# Patient Record
Sex: Female | Born: 1990 | Race: Black or African American | Hispanic: No | Marital: Single | Smoking: Never smoker
Health system: Southern US, Community
[De-identification: ages and names within clinical notes are randomized; demographics above are authoritative.]

## PROBLEM LIST (undated history)

## (undated) ENCOUNTER — Inpatient Hospital Stay (HOSPITAL_COMMUNITY): Payer: Self-pay

## (undated) DIAGNOSIS — A048 Other specified bacterial intestinal infections: Secondary | ICD-10-CM

## (undated) DIAGNOSIS — N200 Calculus of kidney: Secondary | ICD-10-CM

## (undated) DIAGNOSIS — R51 Headache: Secondary | ICD-10-CM

## (undated) DIAGNOSIS — B009 Herpesviral infection, unspecified: Secondary | ICD-10-CM

## (undated) DIAGNOSIS — D649 Anemia, unspecified: Secondary | ICD-10-CM

## (undated) DIAGNOSIS — N83209 Unspecified ovarian cyst, unspecified side: Secondary | ICD-10-CM

## (undated) DIAGNOSIS — O139 Gestational [pregnancy-induced] hypertension without significant proteinuria, unspecified trimester: Secondary | ICD-10-CM

## (undated) DIAGNOSIS — N390 Urinary tract infection, site not specified: Secondary | ICD-10-CM

## (undated) DIAGNOSIS — K8020 Calculus of gallbladder without cholecystitis without obstruction: Secondary | ICD-10-CM

## (undated) HISTORY — PX: APPENDECTOMY: SHX54

## (undated) HISTORY — DX: Urinary tract infection, site not specified: N39.0

---
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
## 2010-03-05 ENCOUNTER — Emergency Department (HOSPITAL_COMMUNITY)
Admission: EM | Admit: 2010-03-05 | Discharge: 2010-03-05 | Payer: Self-pay | Source: Home / Self Care | Admitting: Emergency Medicine

## 2010-04-15 ENCOUNTER — Emergency Department (HOSPITAL_COMMUNITY)
Admission: EM | Admit: 2010-04-15 | Discharge: 2010-04-15 | Payer: Self-pay | Source: Home / Self Care | Admitting: Emergency Medicine

## 2010-04-15 LAB — POCT URINALYSIS DIPSTICK
Bilirubin Urine: NEGATIVE
Ketones, ur: NEGATIVE mg/dL
Nitrite: NEGATIVE
Protein, ur: NEGATIVE mg/dL
Specific Gravity, Urine: 1.025 (ref 1.005–1.030)
Urine Glucose, Fasting: NEGATIVE mg/dL
Urobilinogen, UA: 0.2 mg/dL (ref 0.0–1.0)
pH: 6.5 (ref 5.0–8.0)

## 2010-05-28 LAB — RAPID URINE DRUG SCREEN, HOSP PERFORMED
Amphetamines: NOT DETECTED
Barbiturates: NOT DETECTED
Benzodiazepines: NOT DETECTED
Cocaine: NOT DETECTED
Opiates: NOT DETECTED
Tetrahydrocannabinol: NOT DETECTED

## 2010-05-28 LAB — URINALYSIS, ROUTINE W REFLEX MICROSCOPIC
Bilirubin Urine: NEGATIVE
Glucose, UA: NEGATIVE mg/dL
Nitrite: NEGATIVE
Protein, ur: 100 mg/dL — AB
Specific Gravity, Urine: 1.018 (ref 1.005–1.030)
Urobilinogen, UA: 0.2 mg/dL (ref 0.0–1.0)
pH: 5 (ref 5.0–8.0)

## 2010-05-28 LAB — URINE MICROSCOPIC-ADD ON

## 2010-05-28 LAB — DIFFERENTIAL
Basophils Absolute: 0 10*3/uL (ref 0.0–0.1)
Basophils Relative: 0 % (ref 0–1)
Eosinophils Absolute: 0.3 10*3/uL (ref 0.0–0.7)
Eosinophils Relative: 3 % (ref 0–5)
Lymphocytes Relative: 23 % (ref 12–46)
Lymphs Abs: 2.1 10*3/uL (ref 0.7–4.0)
Monocytes Absolute: 0.5 10*3/uL (ref 0.1–1.0)
Monocytes Relative: 6 % (ref 3–12)
Neutro Abs: 6.2 10*3/uL (ref 1.7–7.7)
Neutrophils Relative %: 68 % (ref 43–77)

## 2010-05-28 LAB — HEPATIC FUNCTION PANEL
ALT: 13 U/L (ref 0–35)
AST: 20 U/L (ref 0–37)
Albumin: 4.2 g/dL (ref 3.5–5.2)
Alkaline Phosphatase: 78 U/L (ref 39–117)
Bilirubin, Direct: 0.1 mg/dL (ref 0.0–0.3)
Indirect Bilirubin: 0.3 mg/dL (ref 0.3–0.9)
Total Bilirubin: 0.4 mg/dL (ref 0.3–1.2)
Total Protein: 7.8 g/dL (ref 6.0–8.3)

## 2010-05-28 LAB — CBC
HCT: 36.9 % (ref 36.0–46.0)
Hemoglobin: 12.2 g/dL (ref 12.0–15.0)
MCH: 28.3 pg (ref 26.0–34.0)
MCHC: 33.1 g/dL (ref 30.0–36.0)
MCV: 85.6 fL (ref 78.0–100.0)
Platelets: 342 10*3/uL (ref 150–400)
RBC: 4.31 MIL/uL (ref 3.87–5.11)
RDW: 13.7 % (ref 11.5–15.5)
WBC: 9.1 10*3/uL (ref 4.0–10.5)

## 2010-05-28 LAB — BASIC METABOLIC PANEL
BUN: 5 mg/dL — ABNORMAL LOW (ref 6–23)
CO2: 26 mEq/L (ref 19–32)
Calcium: 9.9 mg/dL (ref 8.4–10.5)
Chloride: 106 mEq/L (ref 96–112)
Creatinine, Ser: 0.72 mg/dL (ref 0.4–1.2)
GFR calc Af Amer: >60 mL/min (ref >60)
GFR calc non Af Amer: >60 mL/min (ref >60)
Glucose, Bld: 75 mg/dL (ref 70–99)
Potassium: 3.4 mEq/L — ABNORMAL LOW (ref 3.5–5.1)
Sodium: 141 mEq/L (ref 135–145)

## 2010-05-28 LAB — ACETAMINOPHEN LEVEL: Acetaminophen (Tylenol), Serum: <10 ug/mL — ABNORMAL LOW (ref 10–30)

## 2010-05-28 LAB — ETHANOL: Alcohol, Ethyl (B): <5 mg/dL (ref 0–10)

## 2010-05-28 LAB — PREGNANCY, URINE: Preg Test, Ur: NEGATIVE

## 2010-05-28 LAB — LIPASE, BLOOD: Lipase: 25 U/L (ref 11–59)

## 2010-05-28 LAB — SALICYLATE LEVEL: Salicylate Lvl: 16.2 mg/dL (ref 2.8–20.0)

## 2010-09-18 ENCOUNTER — Inpatient Hospital Stay (HOSPITAL_COMMUNITY)
Admission: EM | Admit: 2010-09-18 | Discharge: 2010-09-18 | Disposition: A | Discharge status: Home / Self Care | Payer: Self-pay | Source: Ambulatory Visit | Attending: Obstetrics and Gynecology | Admitting: Obstetrics and Gynecology

## 2010-09-18 ENCOUNTER — Inpatient Hospital Stay (HOSPITAL_COMMUNITY): Payer: Self-pay

## 2010-09-18 DIAGNOSIS — A5901 Trichomonal vulvovaginitis: Secondary | ICD-10-CM | POA: Insufficient documentation

## 2010-09-18 DIAGNOSIS — O210 Mild hyperemesis gravidarum: Secondary | ICD-10-CM

## 2010-09-18 DIAGNOSIS — O98819 Other maternal infectious and parasitic diseases complicating pregnancy, unspecified trimester: Secondary | ICD-10-CM | POA: Insufficient documentation

## 2010-09-18 LAB — CBC
HCT: 34.8 % — ABNORMAL LOW (ref 36.0–46.0)
Hemoglobin: 11.5 g/dL — ABNORMAL LOW (ref 12.0–15.0)
MCH: 27.2 pg (ref 26.0–34.0)
MCHC: 33 g/dL (ref 30.0–36.0)
MCV: 82.3 fL (ref 78.0–100.0)
Platelets: 327 10*3/uL (ref 150–400)
RBC: 4.23 MIL/uL (ref 3.87–5.11)
RDW: 14.5 % (ref 11.5–15.5)
WBC: 11.1 10*3/uL — ABNORMAL HIGH (ref 4.0–10.5)

## 2010-09-18 LAB — ABO/RH: ABO/RH(D): A POS

## 2010-09-18 LAB — WET PREP, GENITAL: Yeast Wet Prep HPF POC: NONE SEEN

## 2010-09-18 LAB — HCG, QUANTITATIVE, PREGNANCY: hCG, Beta Chain, Quant, S: 7586 m[IU]/mL — ABNORMAL HIGH (ref <5)

## 2010-09-20 LAB — URINE CULTURE
Colony Count: 35000
Culture  Setup Time: 201207040126

## 2010-09-20 LAB — GC/CHLAMYDIA PROBE AMP, GENITAL
Chlamydia, DNA Probe: POSITIVE — AB
GC Probe Amp, Genital: NEGATIVE

## 2010-09-22 LAB — URINE MICROSCOPIC-ADD ON

## 2010-09-22 LAB — URINALYSIS, ROUTINE W REFLEX MICROSCOPIC
Bilirubin Urine: NEGATIVE
Glucose, UA: NEGATIVE mg/dL
Ketones, ur: NEGATIVE mg/dL
Nitrite: NEGATIVE
Protein, ur: NEGATIVE mg/dL
Specific Gravity, Urine: >1.03 — ABNORMAL HIGH (ref 1.005–1.030)
Urobilinogen, UA: 0.2 mg/dL (ref 0.0–1.0)
pH: 6 (ref 5.0–8.0)

## 2010-09-22 LAB — POCT PREGNANCY, URINE: Preg Test, Ur: POSITIVE

## 2010-11-26 ENCOUNTER — Emergency Department (HOSPITAL_COMMUNITY)
Admission: EM | Admit: 2010-11-26 | Discharge: 2010-11-27 | Disposition: A | Discharge status: Home / Self Care | Payer: Self-pay | Attending: Emergency Medicine | Admitting: Emergency Medicine

## 2010-11-26 DIAGNOSIS — R509 Fever, unspecified: Secondary | ICD-10-CM | POA: Insufficient documentation

## 2010-11-26 DIAGNOSIS — O219 Vomiting of pregnancy, unspecified: Secondary | ICD-10-CM | POA: Insufficient documentation

## 2010-11-26 DIAGNOSIS — R109 Unspecified abdominal pain: Secondary | ICD-10-CM | POA: Insufficient documentation

## 2010-11-27 LAB — URINALYSIS, ROUTINE W REFLEX MICROSCOPIC
Bilirubin Urine: NEGATIVE
Glucose, UA: NEGATIVE mg/dL
Hgb urine dipstick: NEGATIVE
Ketones, ur: >80 mg/dL — AB
Leukocytes, UA: NEGATIVE
Nitrite: NEGATIVE
Protein, ur: NEGATIVE mg/dL
Specific Gravity, Urine: 1.023 (ref 1.005–1.030)
Urobilinogen, UA: 1 mg/dL (ref 0.0–1.0)
pH: 6.5 (ref 5.0–8.0)

## 2010-11-27 LAB — CBC
HCT: 31.7 % — ABNORMAL LOW (ref 36.0–46.0)
Hemoglobin: 10.7 g/dL — ABNORMAL LOW (ref 12.0–15.0)
MCH: 27.8 pg (ref 26.0–34.0)
MCHC: 33.8 g/dL (ref 30.0–36.0)
MCV: 82.3 fL (ref 78.0–100.0)
Platelets: 300 10*3/uL (ref 150–400)
RBC: 3.85 MIL/uL — ABNORMAL LOW (ref 3.87–5.11)
RDW: 14.7 % (ref 11.5–15.5)
WBC: 8.9 10*3/uL (ref 4.0–10.5)

## 2010-11-27 LAB — DIFFERENTIAL
Basophils Absolute: 0 10*3/uL (ref 0.0–0.1)
Basophils Relative: 0 % (ref 0–1)
Eosinophils Absolute: 0.1 10*3/uL (ref 0.0–0.7)
Eosinophils Relative: 1 % (ref 0–5)
Lymphocytes Relative: 14 % (ref 12–46)
Lymphs Abs: 1.2 10*3/uL (ref 0.7–4.0)
Monocytes Absolute: 0.9 10*3/uL (ref 0.1–1.0)
Monocytes Relative: 10 % (ref 3–12)
Neutro Abs: 6.8 10*3/uL (ref 1.7–7.7)
Neutrophils Relative %: 76 % (ref 43–77)

## 2010-11-27 LAB — POCT I-STAT, CHEM 8
BUN: <3 mg/dL — ABNORMAL LOW (ref 6–23)
Calcium, Ion: 1.11 mmol/L — ABNORMAL LOW (ref 1.12–1.32)
Chloride: 106 mEq/L (ref 96–112)
Creatinine, Ser: 0.4 mg/dL — ABNORMAL LOW (ref 0.50–1.10)
Glucose, Bld: 83 mg/dL (ref 70–99)
HCT: 31 % — ABNORMAL LOW (ref 36.0–46.0)
Hemoglobin: 10.5 g/dL — ABNORMAL LOW (ref 12.0–15.0)
Potassium: 3.7 mEq/L (ref 3.5–5.1)
Sodium: 135 mEq/L (ref 135–145)
TCO2: 18 mmol/L (ref 0–100)

## 2011-01-25 ENCOUNTER — Inpatient Hospital Stay (HOSPITAL_COMMUNITY)
Admission: AD | Admit: 2011-01-25 | Discharge: 2011-01-25 | Disposition: A | Discharge status: Home / Self Care | Payer: Medicaid Other | Source: Ambulatory Visit | Attending: Obstetrics & Gynecology | Admitting: Obstetrics & Gynecology

## 2011-01-25 ENCOUNTER — Encounter (HOSPITAL_COMMUNITY): Payer: Self-pay

## 2011-01-25 DIAGNOSIS — O210 Mild hyperemesis gravidarum: Secondary | ICD-10-CM | POA: Insufficient documentation

## 2011-01-25 DIAGNOSIS — O0930 Supervision of pregnancy with insufficient antenatal care, unspecified trimester: Secondary | ICD-10-CM

## 2011-01-25 LAB — CBC
HCT: 30.5 % — ABNORMAL LOW (ref 36.0–46.0)
Hemoglobin: 10.2 g/dL — ABNORMAL LOW (ref 12.0–15.0)
MCH: 28.4 pg (ref 26.0–34.0)
MCHC: 33.4 g/dL (ref 30.0–36.0)
MCV: 85 fL (ref 78.0–100.0)
Platelets: 299 10*3/uL (ref 150–400)
RBC: 3.59 MIL/uL — ABNORMAL LOW (ref 3.87–5.11)
RDW: 13.8 % (ref 11.5–15.5)
WBC: 11.4 10*3/uL — ABNORMAL HIGH (ref 4.0–10.5)

## 2011-01-25 LAB — DIFFERENTIAL
Basophils Absolute: 0 10*3/uL (ref 0.0–0.1)
Basophils Relative: 0 % (ref 0–1)
Eosinophils Absolute: 0.3 10*3/uL (ref 0.0–0.7)
Eosinophils Relative: 2 % (ref 0–5)
Lymphocytes Relative: 15 % (ref 12–46)
Lymphs Abs: 1.8 10*3/uL (ref 0.7–4.0)
Monocytes Absolute: 0.7 10*3/uL (ref 0.1–1.0)
Monocytes Relative: 6 % (ref 3–12)
Neutro Abs: 8.7 10*3/uL — ABNORMAL HIGH (ref 1.7–7.7)
Neutrophils Relative %: 76 % (ref 43–77)

## 2011-01-25 LAB — URINALYSIS, ROUTINE W REFLEX MICROSCOPIC
Bilirubin Urine: NEGATIVE
Glucose, UA: NEGATIVE mg/dL
Hgb urine dipstick: NEGATIVE
Ketones, ur: 15 mg/dL — AB
Leukocytes, UA: NEGATIVE
Nitrite: NEGATIVE
Protein, ur: NEGATIVE mg/dL
Specific Gravity, Urine: >1.03 — ABNORMAL HIGH (ref 1.005–1.030)
Urobilinogen, UA: 1 mg/dL (ref 0.0–1.0)
pH: 6.5 (ref 5.0–8.0)

## 2011-01-25 LAB — HEPATITIS B SURFACE ANTIGEN: Hepatitis B Surface Ag: NEGATIVE

## 2011-01-25 LAB — TYPE AND SCREEN
ABO/RH(D): A POS
Antibody Screen: NEGATIVE

## 2011-01-25 LAB — SICKLE CELL SCREEN: Sickle Cell Screen: NEGATIVE

## 2011-01-25 LAB — RAPID HIV SCREEN (WH-MAU): Rapid HIV Screen: NONREACTIVE

## 2011-01-25 LAB — RUBELLA SCREEN: Rubella: 66.8 IU/mL — ABNORMAL HIGH

## 2011-01-25 MED ORDER — ONDANSETRON 4 MG PO TBDP
4.0000 mg | ORAL_TABLET | Freq: Once | ORAL | Status: AC
  Administered 2011-01-25: 4 mg via ORAL
  Filled 2011-01-25: qty 1

## 2011-01-25 MED ORDER — ONDANSETRON 4 MG PO TBDP: 4.0000 mg | ORAL_TABLET | ORAL | Status: AC | PRN

## 2011-01-25 NOTE — ED Provider Notes (Signed)
History     No chief complaint on file.  HPI This is a 20 year old G1 P0 at 24 weeks and 0 days who presents to the MAU with nausea and vomiting. She states that this is been going on since that time that she was pregnant. The vomiting is worse in the evening. She has not had anything that improves the nausea and vomiting. She has been intolerant of most foods but has been able to keep down water and other liquids. She has not had any prenatal care as she has not had any insurance prior to yesterday.  OB History    Grav Para Term Preterm Abortions TAB SAB Ect Mult Living   1 0 0 0 0 0 0 0 0 0       Past Medical History  Diagnosis Date  . No pertinent past medical history     Past Surgical History  Procedure Date  . No past surgeries     No family history on file.  History  Substance Use Topics  . Smoking status: Not on file  . Smokeless tobacco: Not on file  . Alcohol Use:     Allergies: No Known Allergies  Prescriptions prior to admission  Medication Sig Dispense Refill  . acetaminophen (TYLENOL) 500 MG tablet Take 1,000 mg by mouth daily as needed. For pain          Review of Systems  Constitutional: Negative for fever and chills.  Eyes: Negative for blurred vision.  Respiratory: Negative for cough and shortness of breath.   Gastrointestinal: Negative for heartburn, abdominal pain, diarrhea and constipation.  Genitourinary: Negative for dysuria and urgency.  Musculoskeletal: Negative for myalgias.  Neurological: Negative for weakness and headaches.   Physical Exam   Blood pressure 114/81, pulse 124, temperature 98.4 F (36.9 C), temperature source Oral, resp. rate 18.  Physical Exam  Constitutional: She appears well-developed and well-nourished.  HENT:  Head: Normocephalic and atraumatic.  Cardiovascular: Normal rate, regular rhythm and normal heart sounds.  Exam reveals no gallop and no friction rub.   No murmur heard. Respiratory: Effort normal. No  respiratory distress. She has no wheezes. She has no rales. She exhibits no tenderness.  GI: Soft. Bowel sounds are normal. She exhibits no distension and no mass. There is no tenderness. There is no rebound and no guarding.       Fundal height 2 cm above the umbilicus   Fetal heart tracing: Baseline rate 150s.  MAU Course  Procedures  Medical decision-making: As the patient has no sick contacts and that the symptoms are not acutely worse, this is unlikely to be gastrointestinal enteritis. Instead, this likely represents hyperemesis gravidarum.  Assessment and Plan  #25 20 year old G1 P0 at 24 weeks and 0 days #2 insufficient prenatal care and #3 hyperemesis gravidarum  prenatal labs obtained today. I will give the patient a prescription for Zofran to aid in the hyperemesis gravidarum. and also check for H pylori.  Patient was referred to low risk clinic.  Gurneet Matarese JEHIEL 01/25/2011, 12:47 PM

## 2011-01-26 LAB — RPR: RPR Ser Ql: NONREACTIVE

## 2011-01-28 ENCOUNTER — Telehealth: Payer: Self-pay | Admitting: *Deleted

## 2011-01-28 ENCOUNTER — Other Ambulatory Visit: Payer: Self-pay | Admitting: Family Medicine

## 2011-01-28 DIAGNOSIS — A048 Other specified bacterial intestinal infections: Secondary | ICD-10-CM

## 2011-01-28 LAB — H. PYLORI ANTIBODY, IGG: H Pylori IgG: 6.08 {ISR} — ABNORMAL HIGH

## 2011-01-28 MED ORDER — PANTOPRAZOLE SODIUM 40 MG PO TBEC: 40.0000 mg | DELAYED_RELEASE_TABLET | Freq: Two times a day (BID) | ORAL | Status: DC

## 2011-01-28 MED ORDER — AMOXICILLIN 500 MG PO CAPS: 1000.0000 mg | ORAL_CAPSULE | Freq: Two times a day (BID) | ORAL | Status: AC

## 2011-01-28 MED ORDER — CLARITHROMYCIN 500 MG PO TABS: 500.0000 mg | ORAL_TABLET | Freq: Two times a day (BID) | ORAL | Status: AC

## 2011-01-28 NOTE — Telephone Encounter (Signed)
Called pt and informed her of lab test results and treatment prescribed. Pt voiced understanding and stated that she will pick up the medicines.

## 2011-01-28 NOTE — Telephone Encounter (Signed)
Message copied by Jill Side on Mon Jan 28, 2011  2:53 PM ------      Message from: Levie Heritage      Created: Mon Jan 28, 2011  1:46 PM       Please call patient and notify of + h Pylori results, which may be contributing to her Hyper Emesis Gravidum.  Amoxicillin and clarithromycin and Protonix sent to pharmacy.      Thanks.      Gerilyn Pilgrim

## 2011-02-12 ENCOUNTER — Ambulatory Visit: Payer: Medicaid Other

## 2011-02-12 NOTE — Progress Notes (Signed)
Sharp pain in the abd.  Pressure in the vagina. Education booklet given.  Info on Tdap and flu vaccine given.

## 2011-02-13 ENCOUNTER — Ambulatory Visit (INDEPENDENT_AMBULATORY_CARE_PROVIDER_SITE_OTHER): Payer: Medicaid Other | Admitting: Family Medicine

## 2011-02-13 ENCOUNTER — Other Ambulatory Visit (HOSPITAL_COMMUNITY)
Admission: RE | Admit: 2011-02-13 | Discharge: 2011-02-13 | Disposition: A | Discharge status: Home / Self Care | Payer: Medicaid Other | Source: Ambulatory Visit | Attending: Family Medicine | Admitting: Family Medicine

## 2011-02-13 ENCOUNTER — Other Ambulatory Visit: Payer: Self-pay

## 2011-02-13 VITALS — BP 113/71 | Temp 97.1°F | Wt 211.2 lb

## 2011-02-13 DIAGNOSIS — Z23 Encounter for immunization: Secondary | ICD-10-CM

## 2011-02-13 DIAGNOSIS — O0930 Supervision of pregnancy with insufficient antenatal care, unspecified trimester: Secondary | ICD-10-CM

## 2011-02-13 DIAGNOSIS — Z3480 Encounter for supervision of other normal pregnancy, unspecified trimester: Secondary | ICD-10-CM

## 2011-02-13 DIAGNOSIS — Z3490 Encounter for supervision of normal pregnancy, unspecified, unspecified trimester: Secondary | ICD-10-CM

## 2011-02-13 DIAGNOSIS — Z113 Encounter for screening for infections with a predominantly sexual mode of transmission: Secondary | ICD-10-CM | POA: Insufficient documentation

## 2011-02-13 DIAGNOSIS — A048 Other specified bacterial intestinal infections: Secondary | ICD-10-CM

## 2011-02-13 DIAGNOSIS — Z01419 Encounter for gynecological examination (general) (routine) without abnormal findings: Secondary | ICD-10-CM | POA: Insufficient documentation

## 2011-02-13 LAB — POCT URINALYSIS DIP (DEVICE)
Bilirubin Urine: NEGATIVE
Glucose, UA: NEGATIVE mg/dL
Hgb urine dipstick: NEGATIVE
Ketones, ur: NEGATIVE mg/dL
Leukocytes, UA: NEGATIVE
Nitrite: NEGATIVE
Protein, ur: NEGATIVE mg/dL
Specific Gravity, Urine: 1.025 (ref 1.005–1.030)
Urobilinogen, UA: 1 mg/dL (ref 0.0–1.0)
pH: 7.5 (ref 5.0–8.0)

## 2011-02-13 LAB — WET PREP, GENITAL
Trich, Wet Prep: NONE SEEN
Yeast Wet Prep HPF POC: NONE SEEN

## 2011-02-13 LAB — GLUCOSE TOLERANCE, 1 HOUR: Glucose, 1 Hour GTT: 105 mg/dL (ref 70–140)

## 2011-02-13 MED ORDER — AMOXICILLIN 500 MG PO CAPS: 1000.0000 mg | ORAL_CAPSULE | Freq: Two times a day (BID) | ORAL | Status: DC

## 2011-02-13 MED ORDER — CLARITHROMYCIN 500 MG PO TABS: 500.0000 mg | ORAL_TABLET | Freq: Two times a day (BID) | ORAL | Status: AC

## 2011-02-13 MED ORDER — INFLUENZA VIRUS VACC SPLIT PF IM SUSP
0.5000 mL | INTRAMUSCULAR | Status: AC
  Administered 2011-02-13: 0.5 mL via INTRAMUSCULAR

## 2011-02-13 MED ORDER — TETANUS-DIPHTH-ACELL PERTUSSIS 5-2.5-18.5 LF-MCG/0.5 IM SUSP
0.5000 mL | Freq: Once | INTRAMUSCULAR | Status: AC
  Administered 2011-02-13: 0.5 mL via INTRAMUSCULAR

## 2011-02-13 NOTE — Patient Instructions (Signed)
Pregnancy - Third Trimester The third trimester of pregnancy (the last 3 months) is a period of the most rapid growth for you and your baby. The baby approaches a length of 20 inches and a weight of 6 to 10 pounds. The baby is adding on fat and getting ready for life outside your body. While inside, babies have periods of sleeping and waking, suck their thumbs, and hiccups. You can often feel small contractions of the uterus. This is false labor. It is also called Braxton-Hicks contractions. This is like a practice for labor. The usual problems in this stage of pregnancy include more difficulty breathing, swelling of the hands and feet from water retention, and having to urinate more often because of the uterus and baby pressing on your bladder.  PRENATAL EXAMS  Blood work may continue to be done during prenatal exams. These tests are done to check on your health and the probable health of your baby. Blood work is used to follow your blood levels (hemoglobin). Anemia (low hemoglobin) is common during pregnancy. Iron and vitamins are given to help prevent this. You may also continue to be checked for diabetes. Some of the past blood tests may be done again.   The size of the uterus is measured during each visit. This makes sure your baby is growing properly according to your pregnancy dates.   Your blood pressure is checked every prenatal visit. This is to make sure you are not getting toxemia.   Your urine is checked every prenatal visit for infection, diabetes and protein.   Your weight is checked at each visit. This is done to make sure gains are happening at the suggested rate and that you and your baby are growing normally.   Sometimes, an ultrasound is performed to confirm the position and the proper growth and development of the baby. This is a test done that bounces harmless sound waves off the baby so your caregiver can more accurately determine due dates.   Discuss the type of pain  medication and anesthesia you will have during your labor and delivery.   Discuss the possibility and anesthesia if a Cesarean Section might be necessary.   Inform your caregiver if there is any mental or physical violence at home.  Sometimes, a specialized non-stress test, contraction stress test and biophysical profile are done to make sure the baby is not having a problem. Checking the amniotic fluid surrounding the baby is called an amniocentesis. The amniotic fluid is removed by sticking a needle into the belly (abdomen). This is sometimes done near the end of pregnancy if an early delivery is required. In this case, it is done to help make sure the baby's lungs are mature enough for the baby to live outside of the womb. If the lungs are not mature and it is unsafe to deliver the baby, an injection of cortisone medication is given to the mother 1 to 2 days before the delivery. This helps the baby's lungs mature and makes it safer to deliver the baby. CHANGES OCCURING IN THE THIRD TRIMESTER OF PREGNANCY Your body goes through many changes during pregnancy. They vary from person to person. Talk to your caregiver about changes you notice and are concerned about.  During the last trimester, you have probably had an increase in your appetite. It is normal to have cravings for certain foods. This varies from person to person and pregnancy to pregnancy.   You may begin to get stretch marks on your hips,   abdomen, and breasts. These are normal changes in the body during pregnancy. There are no exercises or medications to take which prevent this change.   Constipation may be treated with a stool softener or adding bulk to your diet. Drinking lots of fluids, fiber in vegetables, fruits, and whole grains are helpful.   Exercising is also helpful. If you have been very active up until your pregnancy, most of these activities can be continued during your pregnancy. If you have been less active, it is helpful  to start an exercise program such as walking. Consult your caregiver before starting exercise programs.   Avoid all smoking, alcohol, un-prescribed drugs, herbs and "street drugs" during your pregnancy. These chemicals affect the formation and growth of the baby. Avoid chemicals throughout the pregnancy to ensure the delivery of a healthy infant.   Backache, varicose veins and hemorrhoids may develop or get worse.   You will tire more easily in the third trimester, which is normal.   The baby's movements may be stronger and more often.   You may become short of breath easily.   Your belly button may stick out.   A yellow discharge may leak from your breasts called colostrum.   You may have a bloody mucus discharge. This usually occurs a few days to a week before labor begins.  HOME CARE INSTRUCTIONS   Keep your caregiver's appointments. Follow your caregiver's instructions regarding medication use, exercise, and diet.   During pregnancy, you are providing food for you and your baby. Continue to eat regular, well-balanced meals. Choose foods such as meat, fish, milk and other low fat dairy products, vegetables, fruits, and whole-grain breads and cereals. Your caregiver will tell you of the ideal weight gain.   A physical sexual relationship may be continued throughout pregnancy if there are no other problems such as early (premature) leaking of amniotic fluid from the membranes, vaginal bleeding, or belly (abdominal) pain.   Exercise regularly if there are no restrictions. Check with your caregiver if you are unsure of the safety of your exercises. Greater weight gain will occur in the last 2 trimesters of pregnancy. Exercising helps:   Control your weight.   Get you in shape for labor and delivery.   You lose weight after you deliver.   Rest a lot with legs elevated, or as needed for leg cramps or low back pain.   Wear a good support or jogging bra for breast tenderness during  pregnancy. This may help if worn during sleep. Pads or tissues may be used in the bra if you are leaking colostrum.   Do not use hot tubs, steam rooms, or saunas.   Wear your seat belt when driving. This protects you and your baby if you are in an accident.   Avoid raw meat, cat litter boxes and soil used by cats. These carry germs that can cause birth defects in the baby.   It is easier to loose urine during pregnancy. Tightening up and strengthening the pelvic muscles will help with this problem. You can practice stopping your urination while you are going to the bathroom. These are the same muscles you need to strengthen. It is also the muscles you would use if you were trying to stop from passing gas. You can practice tightening these muscles up 10 times a set and repeating this about 3 times per day. Once you know what muscles to tighten up, do not perform these exercises during urination. It is more likely   to cause an infection by backing up the urine.   Ask for help if you have financial, counseling or nutritional needs during pregnancy. Your caregiver will be able to offer counseling for these needs as well as refer you for other special needs.   Make a list of emergency phone numbers and have them available.   Plan on getting help from family or friends when you go home from the hospital.   Make a trial run to the hospital.   Take prenatal classes with the father to understand, practice and ask questions about the labor and delivery.   Prepare the baby's room/nursery.   Do not travel out of the city unless it is absolutely necessary and with the advice of your caregiver.   Wear only low or no heal shoes to have better balance and prevent falling.  MEDICATIONS AND DRUG USE IN PREGNANCY  Take prenatal vitamins as directed. The vitamin should contain 1 milligram of folic acid. Keep all vitamins out of reach of children. Only a couple vitamins or tablets containing iron may be fatal  to a baby or young child when ingested.   Avoid use of all medications, including herbs, over-the-counter medications, not prescribed or suggested by your caregiver. Only take over-the-counter or prescription medicines for pain, discomfort, or fever as directed by your caregiver. Do not use aspirin, ibuprofen (Motrin, Advil, Nuprin) or naproxen (Aleve) unless OK'd by your caregiver.   Let your caregiver also know about herbs you may be using.   Alcohol is related to a number of birth defects. This includes fetal alcohol syndrome. All alcohol, in any form, should be avoided completely. Smoking will cause low birth rate and premature babies.   Street/illegal drugs are very harmful to the baby. They are absolutely forbidden. A baby born to an addicted mother will be addicted at birth. The baby will go through the same withdrawal an adult does.  SEEK MEDICAL CARE IF: You have any concerns or worries during your pregnancy. It is better to call with your questions if you feel they cannot wait, rather than worry about them. DECISIONS ABOUT CIRCUMCISION You may or may not know the sex of your baby. If you know your baby is a boy, it may be time to think about circumcision. Circumcision is the removal of the foreskin of the penis. This is the skin that covers the sensitive end of the penis. There is no proven medical need for this. Often this decision is made on what is popular at the time or based upon religious beliefs and social issues. You can discuss these issues with your caregiver or pediatrician. SEEK IMMEDIATE MEDICAL CARE IF:   An unexplained oral temperature above 102 F (38.9 C) develops, or as your caregiver suggests.   You have leaking of fluid from the vagina (birth canal). If leaking membranes are suspected, take your temperature and tell your caregiver of this when you call.   There is vaginal spotting, bleeding or passing clots. Tell your caregiver of the amount and how many pads are  used.   You develop a bad smelling vaginal discharge with a change in the color from clear to white.   You develop vomiting that lasts more than 24 hours.   You develop chills or fever.   You develop shortness of breath.   You develop burning on urination.   You loose more than 2 pounds of weight or gain more than 2 pounds of weight or as suggested by your   caregiver.   You notice sudden swelling of your face, hands, and feet or legs.   You develop belly (abdominal) pain. Round ligament discomfort is a common non-cancerous (benign) cause of abdominal pain in pregnancy. Your caregiver still must evaluate you.   You develop a severe headache that does not go away.   You develop visual problems, blurred or double vision.   If you have not felt your baby move for more than 1 hour. If you think the baby is not moving as much as usual, eat something with sugar in it and lie down on your left side for an hour. The baby should move at least 4 to 5 times per hour. Call right away if your baby moves less than that.   You fall, are in a car accident or any kind of trauma.   There is mental or physical violence at home.  Document Released: 02/26/2001 Document Revised: 11/14/2010 Document Reviewed: 08/31/2008 [employer] Patient Information 2012 [employer]. 

## 2011-02-13 NOTE — Progress Notes (Signed)
Pain- abd.  Pulse-108 Pt c/o sore throat, a lot of sputum.

## 2011-02-13 NOTE — Progress Notes (Signed)
   Subjective:    Emma Chambers is a G1P0000 [redacted]w[redacted]d being seen today for her first obstetrical visit.  Her obstetrical history is significant for late to prenatal care. Patient does intend to breast feed. Pregnancy history fully reviewed.  Mirena IUD Breastfeeding.  Patient reports improved nausea and heartburn with PPI.  Has some pelvic pressure.  Denies vaginal bleeding, vaginal discharge.Ceasar Mons Vitals:   02/13/11 0951  BP: 113/71  Temp: 97.1 F (36.2 C)  Weight: 211 lb 3.2 oz (95.8 kg)    HISTORY: OB History    Grav Para Term Preterm Abortions TAB SAB Ect Mult Living   1 0 0 0 0 0 0 0 0 0      # Outc Date GA Lbr Len/2nd Wgt Sex Del Anes PTL Lv   1 CUR              Past Medical History  Diagnosis Date  . No pertinent past medical history    Past Surgical History  Procedure Date  . No past surgeries    No family history on file.   Exam    Uterine Size: size equals dates  Pelvic Exam:    Perineum: No Hemorrhoids   Vulva: normal   Vagina:  normal mucosa, wet prep done, thick whitish discharge   pH: Not done   Cervix: no bleeding following Pap, no cervical motion tenderness and no lesions   Adnexa: not evaluated   Bony Pelvis: average  System: Breast:  not examined   Skin: normal coloration and turgor, no rashes    Neurologic: oriented, normal, normal mood   Extremities: normal strength, tone, and muscle mass   HEENT PERRLA and extra ocular movement intact   Mouth/Teeth mucous membranes moist, pharynx normal without lesions   Neck supple and no masses   Cardiovascular: regular rate and rhythm, no murmurs or gallops   Respiratory:  appears well, vitals normal, no respiratory distress, acyanotic, normal RR, ear and throat exam is normal, neck free of mass or lymphadenopathy, chest clear, no wheezing, crepitations, rhonchi, normal symmetric air entry   Abdomen: soft, non-tender; bowel sounds normal; no masses,  no organomegaly   Urinary: urethral meatus  normal      Assessment:    Pregnancy: G1P0000 There is no problem list on file for this patient.       Plan:     Initial labs drawn. Prenatal vitamins. Problem list reviewed and updated. Genetic Screening: late to care.   Ultrasound discussed; fetal survey: ordered.  Follow up in 2 weeks. Amoxicillin and clarithromycin ordered for H. Pylori.   Wet prep/ PAP done.  GC/chlamydia sent.  Candelaria Celeste JEHIEL 02/13/2011

## 2011-02-14 ENCOUNTER — Ambulatory Visit (HOSPITAL_COMMUNITY)
Admission: RE | Admit: 2011-02-14 | Discharge: 2011-02-14 | Disposition: A | Discharge status: Home / Self Care | Payer: Medicaid Other | Source: Ambulatory Visit | Attending: Family Medicine | Admitting: Family Medicine

## 2011-02-14 DIAGNOSIS — O358XX0 Maternal care for other (suspected) fetal abnormality and damage, not applicable or unspecified: Secondary | ICD-10-CM | POA: Insufficient documentation

## 2011-02-14 DIAGNOSIS — Z1389 Encounter for screening for other disorder: Secondary | ICD-10-CM | POA: Insufficient documentation

## 2011-02-14 DIAGNOSIS — Z3480 Encounter for supervision of other normal pregnancy, unspecified trimester: Secondary | ICD-10-CM

## 2011-02-14 DIAGNOSIS — Z363 Encounter for antenatal screening for malformations: Secondary | ICD-10-CM | POA: Insufficient documentation

## 2011-03-03 ENCOUNTER — Encounter (HOSPITAL_COMMUNITY): Payer: Self-pay | Admitting: *Deleted

## 2011-03-03 ENCOUNTER — Inpatient Hospital Stay (HOSPITAL_COMMUNITY)
Admission: AD | Admit: 2011-03-03 | Discharge: 2011-03-03 | Disposition: A | Discharge status: Home / Self Care | Payer: Medicaid Other | Source: Ambulatory Visit | Attending: Family Medicine | Admitting: Family Medicine

## 2011-03-03 DIAGNOSIS — O212 Late vomiting of pregnancy: Secondary | ICD-10-CM | POA: Insufficient documentation

## 2011-03-03 DIAGNOSIS — E860 Dehydration: Secondary | ICD-10-CM

## 2011-03-03 DIAGNOSIS — G43909 Migraine, unspecified, not intractable, without status migrainosus: Secondary | ICD-10-CM | POA: Insufficient documentation

## 2011-03-03 LAB — URINALYSIS, ROUTINE W REFLEX MICROSCOPIC
Bilirubin Urine: NEGATIVE
Glucose, UA: NEGATIVE mg/dL
Hgb urine dipstick: NEGATIVE
Ketones, ur: 40 mg/dL — AB
Leukocytes, UA: NEGATIVE
Nitrite: NEGATIVE
Protein, ur: NEGATIVE mg/dL
Specific Gravity, Urine: 1.025 (ref 1.005–1.030)
Urobilinogen, UA: 1 mg/dL (ref 0.0–1.0)
pH: 7.5 (ref 5.0–8.0)

## 2011-03-03 MED ORDER — FAMOTIDINE IN NACL 20-0.9 MG/50ML-% IV SOLN
20.0000 mg | Freq: Once | INTRAVENOUS | Status: AC
  Administered 2011-03-03: 20 mg via INTRAVENOUS
  Filled 2011-03-03: qty 50

## 2011-03-03 MED ORDER — DEXAMETHASONE SODIUM PHOSPHATE 10 MG/ML IJ SOLN
10.0000 mg | Freq: Once | INTRAMUSCULAR | Status: AC
  Administered 2011-03-03: 10 mg via INTRAVENOUS
  Filled 2011-03-03: qty 1

## 2011-03-03 MED ORDER — OXYCODONE-ACETAMINOPHEN 5-325 MG PO TABS
1.0000 | ORAL_TABLET | Freq: Once | ORAL | Status: AC
  Administered 2011-03-03: 1 via ORAL
  Filled 2011-03-03: qty 1

## 2011-03-03 MED ORDER — LACTATED RINGERS IV BOLUS (SEPSIS): 1000.0000 mL | Freq: Once | INTRAVENOUS | Status: DC

## 2011-03-03 MED ORDER — DIPHENHYDRAMINE HCL 50 MG/ML IJ SOLN
25.0000 mg | Freq: Once | INTRAMUSCULAR | Status: AC
  Administered 2011-03-03: 25 mg via INTRAVENOUS
  Filled 2011-03-03: qty 1

## 2011-03-03 MED ORDER — PROMETHAZINE HCL 25 MG/ML IJ SOLN
25.0000 mg | Freq: Once | INTRAVENOUS | Status: AC
  Administered 2011-03-03: 25 mg via INTRAVENOUS
  Filled 2011-03-03: qty 1

## 2011-03-03 NOTE — Progress Notes (Signed)
S: Only occasional ptyalism, headache 'still the same'.  Complains of being irritated because Lt arm keeps  'jumping'.  Reassured this was side effect of phenergan, offered to switch ivf to plain LR to complete hydration. Wants IV out and to go home.  O: VSS, HR 115      FHR Cat I, no UCs  A: IUP @ 29.2wks     N/V, dehydration     Migraine     S/P app LR with 25mg  phenergan, 10mg  iv decadron, 25mg  iv benadryl, 20mg  iv pepcid     FHR Cat I, no UC's     Stable maternal/fetal unit  P: Percocet 5/325mg  po x 1 then     D/C home- boyfriend to drive      Keep next PNV as scheduled on Wednesday in [facility]     Return if symptoms worsen, s/s PTL, ROM, VB, or decreased FM  Plan of care reviewed with Maylon Cos, CNM  Joellyn Haff, SNM

## 2011-03-03 NOTE — Progress Notes (Signed)
Pt reports headache off/on for one week, vomiting off/on throughout pregnancy and had blood in emesis today. "feeling weak" for one week. States baby isn't moving as much as normal today.

## 2011-03-03 NOTE — Progress Notes (Signed)
Pt c/o severe  headache and has not taken anything for it,  Says tylenol does not help.  Also states she has had cramping for one month which she says she told her drs. About.  Off and on diarrhea, no sore throat or cough.

## 2011-03-03 NOTE — Progress Notes (Signed)
Pt requesting IV to be removed.  CNM notified.

## 2011-03-03 NOTE — ED Provider Notes (Signed)
History   Shaquala Broeker is a 20 y.o. G1P0000 at [redacted]w[redacted]d presenting with reports of n/v during entire pregnancy, but feels like worsening, bilateral headache rates 10/10, and decreased fm.  Has vomited x 3-4 today, with last time being a few minutes ago.  Intermittent diarrhea x 1 week- none today.  States she began getting migraines with pregnancy, takes apap at home, but doesn't work.  Denies UCs, LOF, or VB.  Next PNV with [facility] this coming Wednesday.    Chief Complaint  Patient presents with   Headache   HPI  OB History    Grav Para Term Preterm Abortions TAB SAB Ect Mult Living   1 0 0 0 0 0 0 0 0 0       Past Medical History  Diagnosis Date   No pertinent past medical history     Past Surgical History  Procedure Date   No past surgeries     Family History  Problem Relation Age of Onset   Asthma Mother    Hypertension Mother    Diabetes Maternal Grandmother    Hypertension Maternal Grandmother    Stroke Maternal Grandmother     History  Substance Use Topics   Smoking status: Never Smoker    Smokeless tobacco: Never Used   Alcohol Use: No    Allergies:  Allergies  Allergen Reactions   Amoxicillin Nausea And Vomiting    Prescriptions prior to admission  Medication Sig Dispense Refill   acetaminophen (TYLENOL) 500 MG tablet Take 1,000 mg by mouth daily as needed. For pain         amoxicillin (AMOXIL) 500 MG capsule Take 1,000 mg by mouth 2 (two) times daily. Pt states this medication causes her to vomit and she discontinued it on her own.        pantoprazole (PROTONIX) 40 MG tablet Take 1 tablet (40 mg total) by mouth 2 (two) times daily.  60 tablet  0   PRESCRIPTION MEDICATION Place 1 tablet under the tongue daily as needed. Pt takes a nausea medication SL possibly Zofran; she doesn't know the strength and pharmacy closed at time.        DISCONTD: amoxicillin (AMOXIL) 500 MG capsule Take 2 capsules (1,000 mg total) by mouth 2 (two) times daily.   40 capsule  0    Review of Systems  Constitutional: Negative.   Eyes: Positive for photophobia. Negative for blurred vision and double vision.  Respiratory: Negative.   Cardiovascular: Negative.   Gastrointestinal: Positive for nausea, vomiting (entire pregnancy, but feels like is worsening. 3-4x's today, had biscuit this am that stayed down, liquids not staying down), abdominal pain (sharp intermittent epigastric pain) and diarrhea (x 1 week 'off and on', none today).  Genitourinary: Negative.   Musculoskeletal: Negative.   Skin: Negative.   Neurological: Positive for dizziness and headaches (describes as bilateral migraine, rates 10/10 with photophobia and phonophobia).  Endo/Heme/Allergies: Negative.   Psychiatric/Behavioral: Negative.    Physical Exam   Blood pressure 117/55, pulse 132, temperature 99.8 F (37.7 C), temperature source Oral, resp. rate 18, height 5' (1.524 m), weight 96.163 kg (212 lb).  Physical Exam  Constitutional: She is oriented to person, place, and time. She appears well-developed and well-nourished.  HENT:  Head: Normocephalic.  Eyes: Conjunctivae are normal. Pupils are equal, round, and reactive to light.  Neck: Normal range of motion.  Cardiovascular: Normal rate, regular rhythm and normal heart sounds.   Respiratory: Effort normal and breath sounds normal.  GI: Soft.  Bowel sounds are normal. She exhibits no distension. There is no tenderness.  Musculoskeletal: Normal range of motion.  Neurological: She is alert and oriented to person, place, and time. She has normal reflexes.  Skin: Skin is warm and dry.  Psychiatric: She has a normal mood and affect. Her behavior is normal. Judgment and thought content normal.   EFM: Cat I, no UC's  MAU Course  Procedures EFM, IVF: 1L LR with 25mg  Phenergan, Dexamethason, Benadryl, and Pepcid IV; Percocet 5/325mg  po x 1   Assessment and Plan  A: IUP @ 29.2wks     N/V     Dehydration     Migraine     Cat I  FHR, no UC's  P: Reassess after IVF and meds  Plan of care discussed w/ Maylon Cos, CNM  Joellyn Haff, SNM 03/03/2011, 9:23 PM

## 2011-03-04 NOTE — ED Provider Notes (Signed)
I have seen/examined this patient and agree with the assessment and plan.  Jhace Fennell E.

## 2011-03-19 NOTE — L&D Delivery Note (Signed)
Delivery Note At 7:28 PM a viable female was delivered via Vaginal, Spontaneous Delivery (Presentation: ; Occiput Anterior).  APGAR:8 9 , ; weight .  8 # 1 ounce Placenta status:intact .  Cord: 3 vessels with the following complications: None.  Cord pH: n/a  Anesthesia: Epidural  Episiotomy: None Lacerations: 1st degree;Labial Suture Repair: 3.0 Est. Blood Loss (mL):   Mom to postpartum.  Baby to nursery-stable.  Jhett Fretwell C. 05/20/2011, 7:41 PM

## 2011-03-22 ENCOUNTER — Encounter (HOSPITAL_COMMUNITY): Payer: Self-pay | Admitting: Obstetrics and Gynecology

## 2011-03-22 ENCOUNTER — Inpatient Hospital Stay (HOSPITAL_COMMUNITY)
Admission: AD | Admit: 2011-03-22 | Discharge: 2011-03-23 | Disposition: A | Discharge status: Home / Self Care | Payer: Medicaid Other | Source: Ambulatory Visit | Attending: Obstetrics & Gynecology | Admitting: Obstetrics & Gynecology

## 2011-03-22 DIAGNOSIS — O4700 False labor before 37 completed weeks of gestation, unspecified trimester: Secondary | ICD-10-CM

## 2011-03-22 NOTE — Progress Notes (Signed)
Pt states, " I have had pressure and contractions all day, then tonight I took a shower and a whole lot of water ran  Out. It still feels wet down there. I'm still having headaches, but I've had them all along."

## 2011-03-22 NOTE — Progress Notes (Signed)
"  I was taking a shower about 10 something.  After the shower, I had some liquid come out of my vagina and then began having pains real bad.  The pain has been coming back to back in my groin area down into my vagina.  The leaking only happened that one time.  No bleeding.  (+) FM."

## 2011-03-23 LAB — URINALYSIS, ROUTINE W REFLEX MICROSCOPIC
Bilirubin Urine: NEGATIVE
Glucose, UA: NEGATIVE mg/dL
Hgb urine dipstick: NEGATIVE
Ketones, ur: NEGATIVE mg/dL
Leukocytes, UA: NEGATIVE
Nitrite: NEGATIVE
Protein, ur: NEGATIVE mg/dL
Specific Gravity, Urine: 1.025 (ref 1.005–1.030)
Urobilinogen, UA: 0.2 mg/dL (ref 0.0–1.0)
pH: 6 (ref 5.0–8.0)

## 2011-03-23 LAB — GC/CHLAMYDIA PROBE AMP, GENITAL
Chlamydia, DNA Probe: NEGATIVE
GC Probe Amp, Genital: NEGATIVE

## 2011-03-23 LAB — WET PREP, GENITAL
Trich, Wet Prep: NONE SEEN
Yeast Wet Prep HPF POC: NONE SEEN

## 2011-03-23 LAB — FETAL FIBRONECTIN: Fetal Fibronectin: NEGATIVE

## 2011-03-23 MED ORDER — NIFEDIPINE 10 MG PO CAPS
10.0000 mg | ORAL_CAPSULE | Freq: Once | ORAL | Status: AC
  Administered 2011-03-23: 10 mg via ORAL
  Filled 2011-03-23: qty 1

## 2011-03-23 NOTE — ED Provider Notes (Signed)
History     Chief Complaint  Patient presents with  . Rupture of Membranes  . Contractions   HPI 21 y.o. G1P0000 at [redacted]w[redacted]d c/o gush of fluid after getting out of shower around 10 PM tonight, no leaking since, + contractions, unable to state how frequent, no bleeding, + fetal movement. Last appt in [facility] in December, missed appointment this week.     Past Medical History  Diagnosis Date  . No pertinent past medical history     Past Surgical History  Procedure Date  . No past surgeries     Family History  Problem Relation Age of Onset  . Asthma Mother   . Hypertension Mother   . Diabetes Maternal Grandmother   . Hypertension Maternal Grandmother   . Stroke Maternal Grandmother     History  Substance Use Topics  . Smoking status: Never Smoker   . Smokeless tobacco: Never Used  . Alcohol Use: No    Allergies:  Allergies  Allergen Reactions  . Amoxicillin Nausea And Vomiting    Prescriptions prior to admission  Medication Sig Dispense Refill  . acetaminophen (TYLENOL) 500 MG tablet Take 1,000 mg by mouth daily as needed. For pain        . pantoprazole (PROTONIX) 40 MG tablet Take 1 tablet (40 mg total) by mouth 2 (two) times daily.  60 tablet  0    Review of Systems  Constitutional: Negative.   Respiratory: Negative.   Cardiovascular: Negative.   Gastrointestinal: Negative for nausea, vomiting, abdominal pain, diarrhea and constipation.  Genitourinary: Negative for dysuria, urgency, frequency, hematuria and flank pain.       Negative for vaginal bleeding, Positive for contractions  Musculoskeletal: Negative.   Neurological: Negative.   Psychiatric/Behavioral: Negative.    Physical Exam   Blood pressure 119/54, temperature 99.4 F (37.4 C), temperature source Oral, resp. rate 20, height 5' 0.5" (1.537 m), weight 210 lb 8 oz (95.482 kg).  Physical Exam  Nursing note and vitals reviewed. Constitutional: She is oriented to person, place, and time. She appears  well-developed and well-nourished. No distress.  HENT:  Head: Normocephalic and atraumatic.  Cardiovascular: Normal rate.   Respiratory: Effort normal.  GI: Soft. Bowel sounds are normal. She exhibits no mass. There is no tenderness. There is no rebound and no guarding.  Genitourinary: There is no rash or lesion on the right labia. There is no rash or lesion on the left labia. Uterus is not tender. Enlarged: Size c/w dates. Cervix exhibits no motion tenderness, no discharge and no friability. No tenderness or bleeding around the vagina. Vaginal discharge (white) found.       SVE: 1/thick/high   Musculoskeletal: Normal range of motion.  Neurological: She is alert and oriented to person, place, and time.  Skin: Skin is warm and dry.  Psychiatric: She has a normal mood and affect.   EFM reactive, irregular UCs MAU Course  Procedures  Results for orders placed during the hospital encounter of 03/22/11 (from the past 24 hour(s))  URINALYSIS, ROUTINE W REFLEX MICROSCOPIC     Status: Normal   Collection Time   03/22/11 11:10 PM      Component Value Range   Color, Urine YELLOW  YELLOW    APPearance CLEAR  CLEAR    Specific Gravity, Urine 1.025  1.005 - 1.030    pH 6.0  5.0 - 8.0    Glucose, UA NEGATIVE  NEGATIVE (mg/dL)   Hgb urine dipstick NEGATIVE  NEGATIVE  Bilirubin Urine NEGATIVE  NEGATIVE    Ketones, ur NEGATIVE  NEGATIVE (mg/dL)   Protein, ur NEGATIVE  NEGATIVE (mg/dL)   Urobilinogen, UA 0.2  0.0 - 1.0 (mg/dL)   Nitrite NEGATIVE  NEGATIVE    Leukocytes, UA NEGATIVE  NEGATIVE   FETAL FIBRONECTIN     Status: Normal   Collection Time   03/23/11 12:35 AM      Component Value Range   Fetal Fibronectin NEGATIVE  NEGATIVE   WET PREP, GENITAL     Status: Abnormal   Collection Time   03/23/11 12:35 AM      Component Value Range   Yeast, Wet Prep NONE SEEN  NONE SEEN    Trich, Wet Prep NONE SEEN  NONE SEEN    Clue Cells, Wet Prep FEW (*) NONE SEEN    WBC, Wet Prep HPF POC FEW (*) NONE  SEEN     Procardia 10 mg PO for contractions, pt reports pain completely resolved following Procardia, contractions resolved on TOCO Assessment and Plan  21 y.o. G1P0000 at [redacted]w[redacted]d Threatened preterm labor - contractions resolved with Procardia, FFN negative F/U in clinic next week, precautions rev'd  FRAZIER,NATALIE 03/23/2011, 12:41 AM

## 2011-03-27 ENCOUNTER — Ambulatory Visit (INDEPENDENT_AMBULATORY_CARE_PROVIDER_SITE_OTHER): Payer: Medicaid Other | Admitting: Advanced Practice Midwife

## 2011-03-27 VITALS — BP 134/76 | Temp 98.0°F | Wt 211.5 lb

## 2011-03-27 DIAGNOSIS — R519 Headache, unspecified: Secondary | ICD-10-CM

## 2011-03-27 DIAGNOSIS — O0930 Supervision of pregnancy with insufficient antenatal care, unspecified trimester: Secondary | ICD-10-CM

## 2011-03-27 DIAGNOSIS — Z3480 Encounter for supervision of other normal pregnancy, unspecified trimester: Secondary | ICD-10-CM

## 2011-03-27 DIAGNOSIS — R51 Headache: Secondary | ICD-10-CM

## 2011-03-27 LAB — POCT URINALYSIS DIP (DEVICE)
Bilirubin Urine: NEGATIVE
Glucose, UA: NEGATIVE mg/dL
Hgb urine dipstick: NEGATIVE
Ketones, ur: NEGATIVE mg/dL
Leukocytes, UA: NEGATIVE
Nitrite: NEGATIVE
Protein, ur: 30 mg/dL — AB
Specific Gravity, Urine: >=1.03 (ref 1.005–1.030)
Urobilinogen, UA: 1 mg/dL (ref 0.0–1.0)
pH: 6 (ref 5.0–8.0)

## 2011-03-27 MED ORDER — ZOLPIDEM TARTRATE 10 MG PO TABS: 10.0000 mg | ORAL_TABLET | Freq: Every evening | ORAL | Status: DC | PRN

## 2011-03-27 MED ORDER — BUTALBITAL-APAP-CAFFEINE 50-325-40 MG PO TABS: 1.0000 | ORAL_TABLET | Freq: Four times a day (QID) | ORAL | Status: DC | PRN

## 2011-03-27 NOTE — Patient Instructions (Signed)
Pregnancy - Third Trimester The third trimester of pregnancy (the last 3 months) is a period of the most rapid growth for you and your baby. The baby approaches a length of 20 inches and a weight of 6 to 10 pounds. The baby is adding on fat and getting ready for life outside your body. While inside, babies have periods of sleeping and waking, suck their thumbs, and hiccups. You can often feel small contractions of the uterus. This is false labor. It is also called Braxton-Hicks contractions. This is like a practice for labor. The usual problems in this stage of pregnancy include more difficulty breathing, swelling of the hands and feet from water retention, and having to urinate more often because of the uterus and baby pressing on your bladder.  PRENATAL EXAMS  Blood work may continue to be done during prenatal exams. These tests are done to check on your health and the probable health of your baby. Blood work is used to follow your blood levels (hemoglobin). Anemia (low hemoglobin) is common during pregnancy. Iron and vitamins are given to help prevent this. You may also continue to be checked for diabetes. Some of the past blood tests may be done again.   The size of the uterus is measured during each visit. This makes sure your baby is growing properly according to your pregnancy dates.   Your blood pressure is checked every prenatal visit. This is to make sure you are not getting toxemia.   Your urine is checked every prenatal visit for infection, diabetes and protein.   Your weight is checked at each visit. This is done to make sure gains are happening at the suggested rate and that you and your baby are growing normally.   Sometimes, an ultrasound is performed to confirm the position and the proper growth and development of the baby. This is a test done that bounces harmless sound waves off the baby so your caregiver can more accurately determine due dates.   Discuss the type of pain  medication and anesthesia you will have during your labor and delivery.   Discuss the possibility and anesthesia if a Cesarean Section might be necessary.   Inform your caregiver if there is any mental or physical violence at home.  Sometimes, a specialized non-stress test, contraction stress test and biophysical profile are done to make sure the baby is not having a problem. Checking the amniotic fluid surrounding the baby is called an amniocentesis. The amniotic fluid is removed by sticking a needle into the belly (abdomen). This is sometimes done near the end of pregnancy if an early delivery is required. In this case, it is done to help make sure the baby's lungs are mature enough for the baby to live outside of the womb. If the lungs are not mature and it is unsafe to deliver the baby, an injection of cortisone medication is given to the mother 1 to 2 days before the delivery. This helps the baby's lungs mature and makes it safer to deliver the baby. CHANGES OCCURING IN THE THIRD TRIMESTER OF PREGNANCY Your body goes through many changes during pregnancy. They vary from person to person. Talk to your caregiver about changes you notice and are concerned about.  During the last trimester, you have probably had an increase in your appetite. It is normal to have cravings for certain foods. This varies from person to person and pregnancy to pregnancy.   You may begin to get stretch marks on your hips,   abdomen, and breasts. These are normal changes in the body during pregnancy. There are no exercises or medications to take which prevent this change.   Constipation may be treated with a stool softener or adding bulk to your diet. Drinking lots of fluids, fiber in vegetables, fruits, and whole grains are helpful.   Exercising is also helpful. If you have been very active up until your pregnancy, most of these activities can be continued during your pregnancy. If you have been less active, it is helpful  to start an exercise program such as walking. Consult your caregiver before starting exercise programs.   Avoid all smoking, alcohol, un-prescribed drugs, herbs and "street drugs" during your pregnancy. These chemicals affect the formation and growth of the baby. Avoid chemicals throughout the pregnancy to ensure the delivery of a healthy infant.   Backache, varicose veins and hemorrhoids may develop or get worse.   You will tire more easily in the third trimester, which is normal.   The baby's movements may be stronger and more often.   You may become short of breath easily.   Your belly button may stick out.   A yellow discharge may leak from your breasts called colostrum.   You may have a bloody mucus discharge. This usually occurs a few days to a week before labor begins.  HOME CARE INSTRUCTIONS   Keep your caregiver's appointments. Follow your caregiver's instructions regarding medication use, exercise, and diet.   During pregnancy, you are providing food for you and your baby. Continue to eat regular, well-balanced meals. Choose foods such as meat, fish, milk and other low fat dairy products, vegetables, fruits, and whole-grain breads and cereals. Your caregiver will tell you of the ideal weight gain.   A physical sexual relationship may be continued throughout pregnancy if there are no other problems such as early (premature) leaking of amniotic fluid from the membranes, vaginal bleeding, or belly (abdominal) pain.   Exercise regularly if there are no restrictions. Check with your caregiver if you are unsure of the safety of your exercises. Greater weight gain will occur in the last 2 trimesters of pregnancy. Exercising helps:   Control your weight.   Get you in shape for labor and delivery.   You lose weight after you deliver.   Rest a lot with legs elevated, or as needed for leg cramps or low back pain.   Wear a good support or jogging bra for breast tenderness during  pregnancy. This may help if worn during sleep. Pads or tissues may be used in the bra if you are leaking colostrum.   Do not use hot tubs, steam rooms, or saunas.   Wear your seat belt when driving. This protects you and your baby if you are in an accident.   Avoid raw meat, cat litter boxes and soil used by cats. These carry germs that can cause birth defects in the baby.   It is easier to loose urine during pregnancy. Tightening up and strengthening the pelvic muscles will help with this problem. You can practice stopping your urination while you are going to the bathroom. These are the same muscles you need to strengthen. It is also the muscles you would use if you were trying to stop from passing gas. You can practice tightening these muscles up 10 times a set and repeating this about 3 times per day. Once you know what muscles to tighten up, do not perform these exercises during urination. It is more likely   to cause an infection by backing up the urine.   Ask for help if you have financial, counseling or nutritional needs during pregnancy. Your caregiver will be able to offer counseling for these needs as well as refer you for other special needs.   Make a list of emergency phone numbers and have them available.   Plan on getting help from family or friends when you go home from the hospital.   Make a trial run to the hospital.   Take prenatal classes with the father to understand, practice and ask questions about the labor and delivery.   Prepare the baby's room/nursery.   Do not travel out of the city unless it is absolutely necessary and with the advice of your caregiver.   Wear only low or no heal shoes to have better balance and prevent falling.  MEDICATIONS AND DRUG USE IN PREGNANCY  Take prenatal vitamins as directed. The vitamin should contain 1 milligram of folic acid. Keep all vitamins out of reach of children. Only a couple vitamins or tablets containing iron may be fatal  to a baby or young child when ingested.   Avoid use of all medications, including herbs, over-the-counter medications, not prescribed or suggested by your caregiver. Only take over-the-counter or prescription medicines for pain, discomfort, or fever as directed by your caregiver. Do not use aspirin, ibuprofen (Motrin, Advil, Nuprin) or naproxen (Aleve) unless OK'd by your caregiver.   Let your caregiver also know about herbs you may be using.   Alcohol is related to a number of birth defects. This includes fetal alcohol syndrome. All alcohol, in any form, should be avoided completely. Smoking will cause low birth rate and premature babies.   Street/illegal drugs are very harmful to the baby. They are absolutely forbidden. A baby born to an addicted mother will be addicted at birth. The baby will go through the same withdrawal an adult does.  SEEK MEDICAL CARE IF: You have any concerns or worries during your pregnancy. It is better to call with your questions if you feel they cannot wait, rather than worry about them. DECISIONS ABOUT CIRCUMCISION You may or may not know the sex of your baby. If you know your baby is a boy, it may be time to think about circumcision. Circumcision is the removal of the foreskin of the penis. This is the skin that covers the sensitive end of the penis. There is no proven medical need for this. Often this decision is made on what is popular at the time or based upon religious beliefs and social issues. You can discuss these issues with your caregiver or pediatrician. SEEK IMMEDIATE MEDICAL CARE IF:   An unexplained oral temperature above 102 F (38.9 C) develops, or as your caregiver suggests.   You have leaking of fluid from the vagina (birth canal). If leaking membranes are suspected, take your temperature and tell your caregiver of this when you call.   There is vaginal spotting, bleeding or passing clots. Tell your caregiver of the amount and how many pads are  used.   You develop a bad smelling vaginal discharge with a change in the color from clear to white.   You develop vomiting that lasts more than 24 hours.   You develop chills or fever.   You develop shortness of breath.   You develop burning on urination.   You loose more than 2 pounds of weight or gain more than 2 pounds of weight or as suggested by your   caregiver.   You notice sudden swelling of your face, hands, and feet or legs.   You develop belly (abdominal) pain. Round ligament discomfort is a common non-cancerous (benign) cause of abdominal pain in pregnancy. Your caregiver still must evaluate you.   You develop a severe headache that does not go away.   You develop visual problems, blurred or double vision.   If you have not felt your baby move for more than 1 hour. If you think the baby is not moving as much as usual, eat something with sugar in it and lie down on your left side for an hour. The baby should move at least 4 to 5 times per hour. Call right away if your baby moves less than that.   You fall, are in a car accident or any kind of trauma.   There is mental or physical violence at home.  Document Released: 02/26/2001 Document Revised: 11/14/2010 Document Reviewed: 08/31/2008 [employer] Patient Information 2012 [employer]. 

## 2011-03-27 NOTE — Progress Notes (Signed)
Having headaches daily for 2 weeks. Tylenol does not help. Also not sleeping. Will try Fioricet and limited Ambien.

## 2011-04-01 NOTE — ED Provider Notes (Signed)
Agree with above note.  Elynn Patteson H. 04/01/2011 6:03 PM

## 2011-04-03 ENCOUNTER — Telehealth: Payer: Self-pay | Admitting: *Deleted

## 2011-04-03 NOTE — Telephone Encounter (Signed)
Pt left message stating that she is having problems with the medication she received @ last visit. Please call back.  I returned her call and discussed her concern. She states that she has been taking the medication "Zolpidem" the last couple nights. Each night she has noticed that her legs begin to shake and jerk as well as her knee swells up this is preventing her from sleeping and she is wondering if it is because of the medication. I told pt that I was not familiar with this side effect but it is possible. I asked if she has read the medication leaflet or paper that was provided when she got her medication. Pt stated that she did not know where it was. I advised pt not to take any more of this medication and to discuss her sx @ next visit. Meanwhile she can try Benadryl OTC for sleep. I also advised pt to call us back if she continues to have the problem with her legs jerking/shaking even though not taking zolpidem (Ambien).  Pt voiced understanding.

## 2011-04-07 ENCOUNTER — Inpatient Hospital Stay (HOSPITAL_COMMUNITY)
Admission: AD | Admit: 2011-04-07 | Discharge: 2011-04-07 | Disposition: A | Discharge status: Home / Self Care | Payer: Medicaid Other | Source: Ambulatory Visit | Attending: Obstetrics & Gynecology | Admitting: Obstetrics & Gynecology

## 2011-04-07 ENCOUNTER — Encounter (HOSPITAL_COMMUNITY): Payer: Self-pay | Admitting: *Deleted

## 2011-04-07 DIAGNOSIS — K5900 Constipation, unspecified: Secondary | ICD-10-CM | POA: Insufficient documentation

## 2011-04-07 DIAGNOSIS — O212 Late vomiting of pregnancy: Secondary | ICD-10-CM | POA: Insufficient documentation

## 2011-04-07 DIAGNOSIS — O99891 Other specified diseases and conditions complicating pregnancy: Secondary | ICD-10-CM | POA: Insufficient documentation

## 2011-04-07 LAB — URINALYSIS, ROUTINE W REFLEX MICROSCOPIC
Bilirubin Urine: NEGATIVE
Glucose, UA: NEGATIVE mg/dL
Hgb urine dipstick: NEGATIVE
Ketones, ur: 15 mg/dL — AB
Leukocytes, UA: NEGATIVE
Nitrite: NEGATIVE
Protein, ur: NEGATIVE mg/dL
Specific Gravity, Urine: 1.02 (ref 1.005–1.030)
Urobilinogen, UA: 0.2 mg/dL (ref 0.0–1.0)
pH: 6 (ref 5.0–8.0)

## 2011-04-07 MED ORDER — MINERAL OIL RE ENEM: 1.0000 | ENEMA | Freq: Once | RECTAL | Status: AC

## 2011-04-07 MED ORDER — POLYETHYLENE GLYCOL 3350 17 GM/SCOOP PO POWD: 17.0000 g | Freq: Every day | ORAL | Status: AC

## 2011-04-07 MED ORDER — PROMETHAZINE HCL 12.5 MG PO TABS: 12.5000 mg | ORAL_TABLET | Freq: Four times a day (QID) | ORAL | Status: DC | PRN

## 2011-04-07 NOTE — ED Provider Notes (Signed)
History     Chief Complaint  Patient presents with  . Contractions  . Constipation   Constipation The current episode started in the past 7 days. The problem is unchanged. Her stool frequency is 1 time per week or less. The stool is described as firm and formed. The patient is not on a high fiber diet. There has not been adequate water intake. Associated symptoms include nausea and vomiting. Pertinent negatives include no abdominal pain, diarrhea or fever.    81yr G1P0 at 34.2wks by [place] presenting with irregular painful contractions, nausea, vomiting, and constipation.  Contractions are somewhat painful and have been irregular, off and on, for past day. Denies any fluid leakage or abnormal discharge. CTX cause minimal discomfort. States good fetal movement.   Patient states no BM in one week, though did have a small BM yesterday. Has tried dulcolax with minimal relief. Not passing gas as much.   Nausea and vomiting has been continual throughout pregnancy. Has zofran at home but does not help much. States that she was able to keep fluids down in MAU.   OB History    Grav Para Term Preterm Abortions TAB SAB Ect Mult Living   1 0 0 0 0 0 0 0 0 0       Past Medical History  Diagnosis Date  . No pertinent past medical history     Past Surgical History  Procedure Date  . No past surgeries     Family History  Problem Relation Age of Onset  . Asthma Mother   . Hypertension Mother   . Diabetes Maternal Grandmother   . Hypertension Maternal Grandmother   . Stroke Maternal Grandmother   . Anesthesia problems Neg Hx   . Malignant hyperthermia Neg Hx   . Pseudochol deficiency Neg Hx   . Hypotension Neg Hx     History  Substance Use Topics  . Smoking status: Never Smoker   . Smokeless tobacco: Never Used  . Alcohol Use: No    Allergies:  Allergies  Allergen Reactions  . Amoxicillin Nausea And Vomiting    Prescriptions prior to admission  Medication Sig Dispense Refill    . acetaminophen (TYLENOL) 500 MG tablet Take 1,000 mg by mouth daily as needed. For pain        . butalbital-acetaminophen-caffeine (FIORICET) 50-325-40 MG per tablet Take 1-2 tablets by mouth every 6 (six) hours as needed for headache.  20 tablet  0  . pantoprazole (PROTONIX) 40 MG tablet Take 1 tablet (40 mg total) by mouth 2 (two) times daily.  60 tablet  0  . zolpidem (AMBIEN) 10 MG tablet Take 1 tablet (10 mg total) by mouth at bedtime as needed for sleep (try one half tablet, if not asleep in 20 minutes, take other half).  30 tablet  0    Review of Systems  Constitutional: Negative for fever and chills.  Eyes: Negative for blurred vision and double vision.  Respiratory: Negative for shortness of breath.   Cardiovascular: Negative for chest pain, palpitations and leg swelling.  Gastrointestinal: Positive for nausea, vomiting and constipation. Negative for heartburn, abdominal pain and diarrhea.  Genitourinary: Negative for dysuria and urgency.  Neurological: Negative for headaches.  All other systems reviewed and are negative.   Physical Exam   Blood pressure 119/64, pulse 117, temperature 98.8 F (37.1 C), temperature source Oral, resp. rate 16, height 5\' 1"  (1.549 m), weight 96.435 kg (212 lb 9.6 oz).  Physical Exam  Constitutional: She is oriented  to person, place, and time. She appears well-developed and well-nourished. No distress.  Eyes: EOM are normal. Pupils are equal, round, and reactive to light.  Cardiovascular: Normal rate, regular rhythm, normal heart sounds and intact distal pulses.  Exam reveals no gallop and no friction rub.   No murmur heard. Respiratory: Effort normal and breath sounds normal. No respiratory distress. She has no wheezes. She has no rales. She exhibits no tenderness.  GI: Soft. Bowel sounds are normal. She exhibits no distension and no mass. There is no tenderness. There is no rebound and no guarding.       Gravid   Musculoskeletal: Normal  range of motion. She exhibits no edema.  Neurological: She is alert and oriented to person, place, and time. She has normal reflexes. No cranial nerve deficit. Coordination normal.  Skin: Skin is warm and dry. No rash noted. She is not diaphoretic. No erythema. No pallor.  Psychiatric: She has a normal mood and affect. Her behavior is normal. Judgment and thought content normal.    MAU Course  Procedures  MDM 1615: initial assessment. Exam benign, AFVSS, patient in no distress. Monitor shows FHR 150-160, (+) accelerations, reactive, CTX irregular every 10-34mins, no decelerations, overall category I. Will try patient with PO hydration and feed. Will check cervix.   1730: reassessment - patient had eaten and tolerating liquids well. Had some nausea but still able to eat. FHR monitor remains category I, reassuring. Cervix 2cm.Offered patient fleet enema for her constipation, or disimpaction if necessary. Patient declined. Gave patient Rx for enema, miralax, and phenergan. Plan to DC to home, followup at next appointment. Information given on when to return to ED or call provider. Patient understood and agreed.   Assessment and Plan  1) Intrauterine Pregnancy  - 34.2 week, no complications thus far  - does not appear in labor at this time  - contractions most likely due to constipation vs braxton hicks   2) constipation  - do not think that patient is obstructed since small BM yest.   - patient declined enema, prefer to take on outpatient basis  - Rx for miralax  - encouraged good hydration  3) Nausea  - Rx for phenergan  Dispo: DC to home, followup at regular appointment, in 3days.     Emma Chambers 04/07/2011, 4:05 PM

## 2011-04-07 NOTE — Progress Notes (Signed)
Not having a regular bowel movement for a week has a very small movement but not sufficient, having contractions last two hours, G1 34 weeks.

## 2011-04-07 NOTE — Progress Notes (Signed)
Pt reports last BM to be one week ago. Pt also reports pelvic pressure and lower back pain.

## 2011-04-10 ENCOUNTER — Ambulatory Visit (INDEPENDENT_AMBULATORY_CARE_PROVIDER_SITE_OTHER): Payer: Medicaid Other | Admitting: Physician Assistant

## 2011-04-10 DIAGNOSIS — Z3480 Encounter for supervision of other normal pregnancy, unspecified trimester: Secondary | ICD-10-CM

## 2011-04-10 DIAGNOSIS — IMO0002 Reserved for concepts with insufficient information to code with codable children: Secondary | ICD-10-CM

## 2011-04-10 DIAGNOSIS — O0930 Supervision of pregnancy with insufficient antenatal care, unspecified trimester: Secondary | ICD-10-CM

## 2011-04-10 DIAGNOSIS — Z3490 Encounter for supervision of normal pregnancy, unspecified, unspecified trimester: Secondary | ICD-10-CM

## 2011-04-10 DIAGNOSIS — O169 Unspecified maternal hypertension, unspecified trimester: Secondary | ICD-10-CM

## 2011-04-10 LAB — POCT URINALYSIS DIP (DEVICE)
Bilirubin Urine: NEGATIVE
Glucose, UA: NEGATIVE mg/dL
Hgb urine dipstick: NEGATIVE
Leukocytes, UA: NEGATIVE
Nitrite: NEGATIVE
Protein, ur: 30 mg/dL — AB
Specific Gravity, Urine: >=1.03 (ref 1.005–1.030)
Urobilinogen, UA: 1 mg/dL (ref 0.0–1.0)
pH: 6 (ref 5.0–8.0)

## 2011-04-10 LAB — COMPREHENSIVE METABOLIC PANEL
ALT: 8 U/L (ref 0–35)
AST: 17 U/L (ref 0–37)
Albumin: 3.8 g/dL (ref 3.5–5.2)
Alkaline Phosphatase: 182 U/L — ABNORMAL HIGH (ref 39–117)
BUN: 4 mg/dL — ABNORMAL LOW (ref 6–23)
CO2: 19 mEq/L (ref 19–32)
Calcium: 8.9 mg/dL (ref 8.4–10.5)
Chloride: 105 mEq/L (ref 96–112)
Creat: 0.46 mg/dL — ABNORMAL LOW (ref 0.50–1.10)
Glucose, Bld: 65 mg/dL — ABNORMAL LOW (ref 70–99)
Potassium: 3.8 mEq/L (ref 3.5–5.3)
Sodium: 135 mEq/L (ref 135–145)
Total Bilirubin: 0.4 mg/dL (ref 0.3–1.2)
Total Protein: 6.5 g/dL (ref 6.0–8.3)

## 2011-04-10 LAB — CBC
HCT: 33.4 % — ABNORMAL LOW (ref 36.0–46.0)
Hemoglobin: 10.4 g/dL — ABNORMAL LOW (ref 12.0–15.0)
MCH: 25 pg — ABNORMAL LOW (ref 26.0–34.0)
MCHC: 31.1 g/dL (ref 30.0–36.0)
MCV: 80.3 fL (ref 78.0–100.0)
Platelets: 355 10*3/uL (ref 150–400)
RBC: 4.16 MIL/uL (ref 3.87–5.11)
RDW: 14.6 % (ref 11.5–15.5)
WBC: 12.9 10*3/uL — ABNORMAL HIGH (ref 4.0–10.5)

## 2011-04-10 MED ORDER — CYCLOBENZAPRINE HCL 10 MG PO TABS: 10.0000 mg | ORAL_TABLET | Freq: Three times a day (TID) | ORAL | Status: DC | PRN

## 2011-04-10 NOTE — Progress Notes (Signed)
Irregular HA, Fiorcet not helping. Denies visual disturbance or other s/s pre-x. Will draw labs today. Pre-x precautions.

## 2011-04-10 NOTE — Patient Instructions (Signed)

## 2011-04-10 NOTE — Progress Notes (Signed)
Swelling in right knee only. Pain in hips and lower back. No vaginal discharge. Pt would like cervix check.

## 2011-04-11 LAB — PROTEIN / CREATININE RATIO, URINE
Creatinine, Urine: 215.8 mg/dL
Protein Creatinine Ratio: 0.07 (ref <0.15)
Total Protein, Urine: 15 mg/dL

## 2011-04-17 ENCOUNTER — Ambulatory Visit (INDEPENDENT_AMBULATORY_CARE_PROVIDER_SITE_OTHER): Payer: Medicaid Other | Admitting: Obstetrics and Gynecology

## 2011-04-17 VITALS — BP 117/74 | Temp 99.1°F | Wt 214.1 lb

## 2011-04-17 DIAGNOSIS — Z3490 Encounter for supervision of normal pregnancy, unspecified, unspecified trimester: Secondary | ICD-10-CM

## 2011-04-17 DIAGNOSIS — Z3480 Encounter for supervision of other normal pregnancy, unspecified trimester: Secondary | ICD-10-CM

## 2011-04-17 DIAGNOSIS — Z3400 Encounter for supervision of normal first pregnancy, unspecified trimester: Secondary | ICD-10-CM

## 2011-04-17 LAB — POCT URINALYSIS DIP (DEVICE)
Bilirubin Urine: NEGATIVE
Glucose, UA: NEGATIVE mg/dL
Hgb urine dipstick: NEGATIVE
Ketones, ur: NEGATIVE mg/dL
Leukocytes, UA: NEGATIVE
Nitrite: NEGATIVE
Protein, ur: NEGATIVE mg/dL
Specific Gravity, Urine: 1.025 (ref 1.005–1.030)
Urobilinogen, UA: 0.2 mg/dL (ref 0.0–1.0)
pH: 6.5 (ref 5.0–8.0)

## 2011-04-17 LAB — STREP B DNA PROBE: GBS: NEGATIVE

## 2011-04-17 NOTE — Patient Instructions (Signed)
Pregnancy - Third Trimester The third trimester of pregnancy (the last 3 months) is a period of the most rapid growth for you and your baby. The baby approaches a length of 20 inches and a weight of 6 to 10 pounds. The baby is adding on fat and getting ready for life outside your body. While inside, babies have periods of sleeping and waking, suck their thumbs, and hiccups. You can often feel small contractions of the uterus. This is false labor. It is also called Braxton-Hicks contractions. This is like a practice for labor. The usual problems in this stage of pregnancy include more difficulty breathing, swelling of the hands and feet from water retention, and having to urinate more often because of the uterus and baby pressing on your bladder.  PRENATAL EXAMS  Blood work may continue to be done during prenatal exams. These tests are done to check on your health and the probable health of your baby. Blood work is used to follow your blood levels (hemoglobin). Anemia (low hemoglobin) is common during pregnancy. Iron and vitamins are given to help prevent this. You may also continue to be checked for diabetes. Some of the past blood tests may be done again.   The size of the uterus is measured during each visit. This makes sure your baby is growing properly according to your pregnancy dates.   Your blood pressure is checked every prenatal visit. This is to make sure you are not getting toxemia.   Your urine is checked every prenatal visit for infection, diabetes and protein.   Your weight is checked at each visit. This is done to make sure gains are happening at the suggested rate and that you and your baby are growing normally.   Sometimes, an ultrasound is performed to confirm the position and the proper growth and development of the baby. This is a test done that bounces harmless sound waves off the baby so your caregiver can more accurately determine due dates.   Discuss the type of pain  medication and anesthesia you will have during your labor and delivery.   Discuss the possibility and anesthesia if a Cesarean Section might be necessary.   Inform your caregiver if there is any mental or physical violence at home.  Sometimes, a specialized non-stress test, contraction stress test and biophysical profile are done to make sure the baby is not having a problem. Checking the amniotic fluid surrounding the baby is called an amniocentesis. The amniotic fluid is removed by sticking a needle into the belly (abdomen). This is sometimes done near the end of pregnancy if an early delivery is required. In this case, it is done to help make sure the baby's lungs are mature enough for the baby to live outside of the womb. If the lungs are not mature and it is unsafe to deliver the baby, an injection of cortisone medication is given to the mother 1 to 2 days before the delivery. This helps the baby's lungs mature and makes it safer to deliver the baby. CHANGES OCCURING IN THE THIRD TRIMESTER OF PREGNANCY Your body goes through many changes during pregnancy. They vary from person to person. Talk to your caregiver about changes you notice and are concerned about.  During the last trimester, you have probably had an increase in your appetite. It is normal to have cravings for certain foods. This varies from person to person and pregnancy to pregnancy.   You may begin to get stretch marks on your hips,   abdomen, and breasts. These are normal changes in the body during pregnancy. There are no exercises or medications to take which prevent this change.   Constipation may be treated with a stool softener or adding bulk to your diet. Drinking lots of fluids, fiber in vegetables, fruits, and whole grains are helpful.   Exercising is also helpful. If you have been very active up until your pregnancy, most of these activities can be continued during your pregnancy. If you have been less active, it is helpful  to start an exercise program such as walking. Consult your caregiver before starting exercise programs.   Avoid all smoking, alcohol, un-prescribed drugs, herbs and "street drugs" during your pregnancy. These chemicals affect the formation and growth of the baby. Avoid chemicals throughout the pregnancy to ensure the delivery of a healthy infant.   Backache, varicose veins and hemorrhoids may develop or get worse.   You will tire more easily in the third trimester, which is normal.   The baby's movements may be stronger and more often.   You may become short of breath easily.   Your belly button may stick out.   A yellow discharge may leak from your breasts called colostrum.   You may have a bloody mucus discharge. This usually occurs a few days to a week before labor begins.  HOME CARE INSTRUCTIONS   Keep your caregiver's appointments. Follow your caregiver's instructions regarding medication use, exercise, and diet.   During pregnancy, you are providing food for you and your baby. Continue to eat regular, well-balanced meals. Choose foods such as meat, fish, milk and other low fat dairy products, vegetables, fruits, and whole-grain breads and cereals. Your caregiver will tell you of the ideal weight gain.   A physical sexual relationship may be continued throughout pregnancy if there are no other problems such as early (premature) leaking of amniotic fluid from the membranes, vaginal bleeding, or belly (abdominal) pain.   Exercise regularly if there are no restrictions. Check with your caregiver if you are unsure of the safety of your exercises. Greater weight gain will occur in the last 2 trimesters of pregnancy. Exercising helps:   Control your weight.   Get you in shape for labor and delivery.   You lose weight after you deliver.   Rest a lot with legs elevated, or as needed for leg cramps or low back pain.   Wear a good support or jogging bra for breast tenderness during  pregnancy. This may help if worn during sleep. Pads or tissues may be used in the bra if you are leaking colostrum.   Do not use hot tubs, steam rooms, or saunas.   Wear your seat belt when driving. This protects you and your baby if you are in an accident.   Avoid raw meat, cat litter boxes and soil used by cats. These carry germs that can cause birth defects in the baby.   It is easier to loose urine during pregnancy. Tightening up and strengthening the pelvic muscles will help with this problem. You can practice stopping your urination while you are going to the bathroom. These are the same muscles you need to strengthen. It is also the muscles you would use if you were trying to stop from passing gas. You can practice tightening these muscles up 10 times a set and repeating this about 3 times per day. Once you know what muscles to tighten up, do not perform these exercises during urination. It is more likely   to cause an infection by backing up the urine.   Ask for help if you have financial, counseling or nutritional needs during pregnancy. Your caregiver will be able to offer counseling for these needs as well as refer you for other special needs.   Make a list of emergency phone numbers and have them available.   Plan on getting help from family or friends when you go home from the hospital.   Make a trial run to the hospital.   Take prenatal classes with the father to understand, practice and ask questions about the labor and delivery.   Prepare the baby's room/nursery.   Do not travel out of the city unless it is absolutely necessary and with the advice of your caregiver.   Wear only low or no heal shoes to have better balance and prevent falling.  MEDICATIONS AND DRUG USE IN PREGNANCY  Take prenatal vitamins as directed. The vitamin should contain 1 milligram of folic acid. Keep all vitamins out of reach of children. Only a couple vitamins or tablets containing iron may be fatal  to a baby or young child when ingested.   Avoid use of all medications, including herbs, over-the-counter medications, not prescribed or suggested by your caregiver. Only take over-the-counter or prescription medicines for pain, discomfort, or fever as directed by your caregiver. Do not use aspirin, ibuprofen (Motrin, Advil, Nuprin) or naproxen (Aleve) unless OK'd by your caregiver.   Let your caregiver also know about herbs you may be using.   Alcohol is related to a number of birth defects. This includes fetal alcohol syndrome. All alcohol, in any form, should be avoided completely. Smoking will cause low birth rate and premature babies.   Street/illegal drugs are very harmful to the baby. They are absolutely forbidden. A baby born to an addicted mother will be addicted at birth. The baby will go through the same withdrawal an adult does.  SEEK MEDICAL CARE IF: You have any concerns or worries during your pregnancy. It is better to call with your questions if you feel they cannot wait, rather than worry about them. DECISIONS ABOUT CIRCUMCISION You may or may not know the sex of your baby. If you know your baby is a boy, it may be time to think about circumcision. Circumcision is the removal of the foreskin of the penis. This is the skin that covers the sensitive end of the penis. There is no proven medical need for this. Often this decision is made on what is popular at the time or based upon religious beliefs and social issues. You can discuss these issues with your caregiver or pediatrician. SEEK IMMEDIATE MEDICAL CARE IF:   An unexplained oral temperature above 102 F (38.9 C) develops, or as your caregiver suggests.   You have leaking of fluid from the vagina (birth canal). If leaking membranes are suspected, take your temperature and tell your caregiver of this when you call.   There is vaginal spotting, bleeding or passing clots. Tell your caregiver of the amount and how many pads are  used.   You develop a bad smelling vaginal discharge with a change in the color from clear to white.   You develop vomiting that lasts more than 24 hours.   You develop chills or fever.   You develop shortness of breath.   You develop burning on urination.   You loose more than 2 pounds of weight or gain more than 2 pounds of weight or as suggested by your   caregiver.   You notice sudden swelling of your face, hands, and feet or legs.   You develop belly (abdominal) pain. Round ligament discomfort is a common non-cancerous (benign) cause of abdominal pain in pregnancy. Your caregiver still must evaluate you.   You develop a severe headache that does not go away.   You develop visual problems, blurred or double vision.   If you have not felt your baby move for more than 1 hour. If you think the baby is not moving as much as usual, eat something with sugar in it and lie down on your left side for an hour. The baby should move at least 4 to 5 times per hour. Call right away if your baby moves less than that.   You fall, are in a car accident or any kind of trauma.   There is mental or physical violence at home.  Document Released: 02/26/2001 Document Revised: 11/14/2010 Document Reviewed: 08/31/2008 [employer] Patient Information 2012 [employer]. 

## 2011-04-17 NOTE — Progress Notes (Signed)
Edema- knees, feet.  Pulse-104

## 2011-04-17 NOTE — Progress Notes (Signed)
PreE labs last wk all well WNL. Still with intermittent H/A, not at present. Sleeping better. Discussed maximum Tylenol dose/d including Fiorocet and relaxation measures. Was seen in MAU for UCs last wk, cx 2 cm. Cultures done.

## 2011-04-18 LAB — GC/CHLAMYDIA PROBE AMP, GENITAL
Chlamydia, DNA Probe: NEGATIVE
GC Probe Amp, Genital: NEGATIVE

## 2011-04-20 LAB — CULTURE, BETA STREP (GROUP B ONLY)

## 2011-05-01 ENCOUNTER — Ambulatory Visit (INDEPENDENT_AMBULATORY_CARE_PROVIDER_SITE_OTHER): Payer: Medicaid Other | Admitting: Advanced Practice Midwife

## 2011-05-01 DIAGNOSIS — Z3490 Encounter for supervision of normal pregnancy, unspecified, unspecified trimester: Secondary | ICD-10-CM

## 2011-05-01 DIAGNOSIS — O0930 Supervision of pregnancy with insufficient antenatal care, unspecified trimester: Secondary | ICD-10-CM

## 2011-05-01 LAB — POCT URINALYSIS DIP (DEVICE)
Bilirubin Urine: NEGATIVE
Glucose, UA: NEGATIVE mg/dL
Hgb urine dipstick: NEGATIVE
Ketones, ur: NEGATIVE mg/dL
Leukocytes, UA: NEGATIVE
Nitrite: NEGATIVE
Protein, ur: NEGATIVE mg/dL
Specific Gravity, Urine: 1.025 (ref 1.005–1.030)
Urobilinogen, UA: 0.2 mg/dL (ref 0.0–1.0)
pH: 6.5 (ref 5.0–8.0)

## 2011-05-01 LAB — HIV ANTIBODY (ROUTINE TESTING W REFLEX): HIV: NONREACTIVE

## 2011-05-01 NOTE — Patient Instructions (Signed)
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 El aumento de peso es ms rpido. El beb alcanza un largo de aproximadamente 50 cm (20 pulgadas) y pesa entre 2,700 y 4,500 kg (6 a 10 libras). El beb gana ms tejido graso y ya est listo para la vida fuera del cuerpo de la Emma Emma Chambers. Mientras estn en el interior, los bebs tienen perodos de sueo y vigilia, Emma Emma Chambers y tienen 66. Quizs sienta pequeas contracciones del tero. Este es el falso trabajo de Emma Emma Chambers. Tambin se las conoce como contracciones de Braxton-Hicks. Es como una prctica del parto. Los problemas ms habituales de esta etapa del embarazo incluyen mayor dificultad para respirar, Emma manos y los pies por retencin de lquidos y la necesidad de Emma Emma Chambers con ms frecuencia debido a que el tero y el beb presionan sobre la vejiga.  EXAMENES PRENATALES  Durante los [place] Emma Chambers, deber seguir realizando pruebas de Emma Emma Chambers, segn avance el Emma Emma Chambers. Estas pruebas se realizan para controlar su salud y la del beb. Tambin se realizan anlisis de sangre para Emma Emma Chambers [employer] niveles de Emma Emma Chambers. La anemia (bajo nivel de hemoglobina) es frecuente durante el embarazo. Para prevenirla, se administran hierro y vitaminas. Tambin le harn nuevas pruebas para descartar la diabetes. Podrn repetirle algunas de las [employer] hicieron previamente.   En cada visita le medirn el tamao del tero. Es para asegurarse de que el beb se desarrolla correctamente.   Tambin en cada visita la pesarn. Esto se realiza para asegurarse de que aumenta de peso al ritmo indicado y que usted y su beb evolucionan normalmente.   En algunas ocasiones se realiza una ecografa para confirmar el correcto desarrollo y evolucin del beb. Esta prueba se realiza con ondas sonoras inofensivas para el beb, de modo  que el profesional pueda calcular con ms precisin la fecha del Emma Emma Chambers.   [name] posibilidades de la anestesia si necesita cesrea.  Algunas veces se realizan pruebas especializadas del lquido amnitico que rodea al beb. Esta prueba se denomina amniocentesis. El lquido amnitico se obtiene introduciendo una aguja en el abdomen (vientre). En ocasiones se lleva a cabo cerca del final del embarazo, si es Emma Emma Chambers. En este caso se realiza para asegurarse de que los pulmones del beb estn lo suficientemente maduros como para que pueda vivir fuera del tero. CAMBIOS QUE OCURREN EN EL TERCER TRIMESTRE DEL EMBARAZO Su organismo atravesar diferentes cambios durante el embarazo que varan de Emma Emma Chambers persona a Emma Emma Chambers. [place] con el profesional que la asiste acerca los cambios que usted note y que la preocupen.  Durante el ltimo trimestre probablemente sienta un aumento del apetito. Es normal tener "antojos" de Emma Emma Chambers. Esto vara de Emma Emma Chambers persona a otra y de un embarazo a Emma Emma Chambers.   Podrn aparecer las primeras estras en las caderas, abdomen y [name]. Estos son cambios normales del cuerpo durante el Emma Chambers. No existen medicamentos ni ejercicios que puedan prevenir Emma Emma Chambers.   El estreimiento puede tratarse con un laxante o agregando fibra a su dieta. Beber grandes cantidades de lquidos, tomar fibras en forma de verduras, frutas y granos integrales es de Emma Chambers.   Tambin es beneficioso practicar actividad fsica. Si ha sido una persona Emma Emma Chambers, podr continuar con la Emma Emma Chambers de las actividades durante el mismo. Si ha sido Emma Emma Chambers, puede ser beneficioso  que comience con un programa de ejercicios, como Pension scheme Chambers. Consulte con el profesional que la asiste antes de comenzar un programa de ejercicios.   Evite el consumo de cigarrillos, el alcohol, los medicamentos no prescritos y las "drogas de la calle" durante el Emma Emma Chambers. Estas sustancias  qumicas afectan la formacin y el desarrollo del beb. Evite estas sustancias durante todo el embarazo para asegurar el nacimiento de un beb sano.   Dolor de espalda, venas varicosas y hemorroides podran aparecer o empeorar.   Los movimientos del beb pueden ser ms bruscos y aparecer ms a menudo.   Puede que note dificultades para respirar facilmente.   El ombligo podra salrsele hacia afuera.   Puede segregar un lquido amarillento (calostro) de las [place].   Puede segregar mucus con sangre. Esto normalmente ocurre unos pocos das a una semana antes de que comience el Emma Emma Chambers.  Emma Chambers parte de los cuidados que se aconsejan son los mismos que los indicados para las primeras etapas del Emma Emma Chambers. Es importante que concurra a todas las citas con el profesional y siga sus instrucciones con Emma Emma Chambers a los medicamentos que deba Emma Emma Chambers, a la actividad fsica y a Emma Emma Chambers.   Durante el embarazo debe obtener nutrientes para usted y para su beb. Consuma alimentos balanceados a intervalos regulares. Elija alimentos como Emma Chambers, pescado, Emma Chambers y otros productos lcteos descremados, verduras, frutas, panes integrales y cereales. El Conservation Chambers, nature cul es el aumento de peso ideal.   Las relaciones sexuales pueden continuarse hasta casi el final del embarazo, si no se presentan otros problemas como prdida prematura (antes de tiempo) de lquido amnitico, hemorragia vaginal o dolor abdominal (en el vientre).   [name], si no tiene restricciones. Consulte con el profesional que la asiste si no sabe con certeza si determinados ejercicios son seguros. El mayor aumento de peso se produce Emma Emma Chambers ltimos trimestres del Emma Emma Chambers.   Haga reposo con frecuencia, con las piernas elevadas, o segn lo necesite para evitar los calambres y el dolor de cintura.   Use un buen sostn o como los que se usan para hacer  deportes para Emma Emma Chambers la sensibilidad de las Emma Chambers. Tambin puede serle til si lo Emma Chambers mientras duerme. Si pierde Adult Emma Chambers, podr [employer].   No utilice la baera con agua caliente, baos turcos y saunas.   Colquese el cinturn de seguridad cuando conduzca. Este la proteger a usted y al beb en caso de accidente.   Evite comer Emma Chambers cruda y el contacto con los utensilios y desperdicios de los gatos. Estos elementos contienen grmenes que pueden causar defectos de nacimiento en el beb.   Es fcil perder algo de orina durante el [place]. Apretar y Software engineer los msculos de la pelvis la ayudar con este problema. Practique detener la miccin cuando est en el bao. Estos son los mismos msculos que Advertising copywriter. Son [employer] mismos msculos que utiliza cuando trata de [employer] gases. Puede practicar apretando estos msculos [employer], y repetir esto tres veces por da aproximadamente. Una vez que conozca qu msculos debe contraer, no realice estos ejercicios durante la miccin. Puede favorecerle una infeccin si la orina vuelve hacia atrs.   Pida ayuda si tiene necesidades econmicas, de asesoramiento o nutricionales durante el [place]. El profesional podr ayudarla con respecto a estas necesidades, o derivarla a otros especialistas.   Practique la ida [facility] a modo  de prueba.   Tome clases prenatales junto con su pareja para comprender, practicar y hacer preguntas acerca del Emma Emma Chambers de parto y el nacimiento.   Prepare la habitacin del beb.   No viaje fuera de la ciudad a menos que sea absolutamente necesario y con el consejo del mdico.   Use slo zapatos bajos sin taco para tener un mejor equilibrio y prevenir cadas.  EL CONSUMO DE MEDICAMENTOS Y DROGAS DURANTE EL EMBARAZO  Contine tomando las vitaminas apropiadas para esta etapa tal como se le indic. Las vitaminas deben contener un miligramo de cido flico y deben suplementarse con  hierro. Guarde todas las vitaminas fuera del alcance de los nios. La ingestin de slo un par de vitaminas o comprimidos que contengan hierro pueden ocasionar la [employer] en un beb o en un nio pequeo.   Evite el uso de [place], inclusive los de venta [place], que no hayan sido prescritos o indicados por el profesional que la asiste. Algunos medicamentos pueden causar problemas fsicos al beb. Utilice los medicamentos de venta libre o de prescripcin para Conservation Chambers, historic buildings, Health and safety inspector o la [place], segn se lo indique el profesional que lo asiste. No utilice aspirina, ibuprofeno (Motrin, Advil, Nuprin) o naproxeno (Aleve) a menos que el profesional la autorice.   El alcohol se asocia a cierto nmero de defectos del nacimiento, incluido el sndrome de alcoholismo fetal. Debe evitar el consumo de alcohol en cualquiera de sus formas. El cigarrillo causa nacimientos prematuros y bebs de bajo peso al nacer. Las drogas de la calle son muy nocivas para el beb y estn absolutamente prohibidas. Un beb que nace de Emma Emma Chambers, ser adicto al nacer. Ese beb tendr los mismos sntomas de abstinencia que un adulto.   Infrmele al profesional si consume alguna droga.  SOLICITE ATENCIN MDICA SI: Tiene alguna preocupacin Solicitor. Es mejor que llame para formular las preguntas si no puede esperar hasta la prxima visita, que sentirse preocupada por ellas.  Emma Emma Chambers o no cul es el sexo de su beb. Si es un varn, ste es el momento de pensar acerca de la circuncisin. La circuncisin es la extirpacin del prepucio. Esta es la piel que cubre el extremo sensible del pene. No hay un motivo mdico que lo justifique. Generalmente la decisin se toma segn lo que sea popular en ese momento, o se basa en creencias religiosas. Podr conversar estos temas con el profesional que la asiste. SOLICITE ATENCIN MDICA DE INMEDIATO SI:  La temperatura oral se eleva  sin motivo por encima de 5 F (38.9 C) o segn le indique el profesional que la asiste.   Tiene una prdida de lquido por la vagina (canal de parto). Si sospecha una ruptura de las [place], tmese la temperatura y llame al profesional para informarlo sobre esto.   Observa unas pequeas manchas, una hemorragia vaginal o elimina cogulos. Avsele al profesional acerca de la cantidad y de cuntos apsitos est utilizando.   Presenta un olor desagradable en la secrecin vaginal y observa un cambio en el color, de transparente a blanco.   Ha vomitado durante ms de 24 horas.   Presenta escalofros o fiebre.   Comienza a sentir falta de Emma Emma Chambers.   Siente ardor al [place].   [place] o sube ms de 900 g (ms de 2 libras), o segn lo indicado por el profesional que la asiste. Observa que sbitamente se le [employer], las manos, los pies o las  piernas.   Presenta dolor abdominal. Las molestias en el ligamento redondo son Emma Emma Chambers causa benigna (no cancerosa) frecuente de Social research Chambers, government abdominal durante el Emma Emma Chambers, pero el profesional que la asiste deber evaluarlo.   Presenta dolor de cabeza intenso que no se Emma Emma Chambers.   Si no siente los movimientos del beb durante ms de tres horas. Si piensa que el beb no se mueve tanto como lo haca habitualmente, coma algo que Emma Emma Chambers y Emma Emma Chambers lado izquierdo durante Emma Emma Chambers. El beb debe moverse al menos 4  5 veces por hora. Comunquese inmediatamente si el beb se mueve menos que lo indicado.   Se cae, se ve involucrada en un accidente automovilstico o sufre algn tipo de traumatismo.   En su hogar hay violencia mental o fsica.  Document Released: 12/12/2004 Document Revised: 11/14/2010 [facility] Patient Information 2012 Emma Emma Chambers. Birth Control Choices Birth control is the use of any practices, methods, or devices to prevent pregnancy from happening in a sexually active woman.  Below are some birth control choices to help avoid  pregnancy.  Not having sex (abstinence) is the surest form of birth control. This requires self-control. There is no Emma of acquiring a sexually transmitted disease (STD), including acquired immunodeficiency syndrome (AIDS).   Periodic abstinence requires self-control during certain times of the month.   Calendar method, timing your menstrual periods from month to month.   Ovulation method is avoiding sexual intercourse around the time you produce an egg (ovulate).   Symptotherm method is avoiding sexual intercourse at the time of ovulation, using a thermometer and ovulation symptoms.   Post ovulation method is the timing of sexual intercourse after you ovulated.  These methods do not protect against STDs, including AIDS.  Birth control pills (BCPs) contain estrogen and progesterone hormone. These medicines work by stopping the egg from forming in the ovary (ovulation). Birth control pills are prescribed by a caregiver who will ask you questions about the risks of taking BCPs. Birth control pills do not protect against STDs, including AIDS.   "Minipill" birth control pills have only the progesterone hormone. They are taken every day of each month and must be prescribed by your caregiver. They do not protect against STDs, including AIDS.   Emergency contraception is often call the "morning after" pill. This pill can be taken right after sex or up to five days after sex if you think your birth control failed, you failed to use contraception, or you were forced to have sex. It is most effective the sooner you take the pills after having sexual intercourse. Do not use emergency contraception as your only form of birth control. Emergency contraceptive pills are available without a prescription. Check with your pharmacist.   Condoms are a thin sheath of latex, synthetic material, or lambskin worn over the penis during sexual intercourse. They can have a spermicide in or on them when you buy them.  Latex condoms can prevent pregnancy and STDs. "Natural" or lambskin condoms can prevent pregnancy but may not protect against STDs, including AIDS.   Female condoms are a soft, loose-fitting sheath that is put into the vagina before sexual intercourse. They can prevent pregnancy and STDs, including AIDS.   Sponge is a soft, circular piece of polyurethane foam with spermicide in it that is inserted into the vagina after wetting it and before sexual intercourse. It does not require a prescription from your caregiver. It does not protect against STDs, including AIDS.   Diaphragm is a soft,  latex, dome-shaped barrier that must be fitted by a caregiver. It is inserted into the vagina, along with a spermicidal jelly. After the proper fitting for a diaphragm, always insert the diaphragm before intercourse. The diaphragm should be left in the vagina for 6 to 8 hours after intercourse. Removal and reinsertion with a spermicide is always necessary after any use. It does not protect against STDs, including AIDS.   Progesterone-only injections are given every 3 months to prevent pregnancy. These injections contain synthetic progesterone and no estrogen. This hormone stops the ovaries from releasing eggs. It also causes the cervical mucus to thicken and changes the uterine lining. This makes it harder for sperm to survive in the uterus. It does not protect against STDs, including AIDS.   Birth Control Patch contains hormones similar to those in birth control pills, so effectiveness, risks, and side effects are similar. It must be changed once a week and is prescribed by a caregiver. It is less effective in very overweight women. It does not protect against STDs, including AIDS.   Vaginal Ring contains hormones similar to those in birth control pills. It is left in place for 3 weeks, removed for 1 week, and then a Emma one is put back into the vagina. It comes with a timer to put in your purse to help you remember when  to take it out or put a Emma one in. A caregiver's examination and prescription is necessary, just like with birth control pills and the patch. It does not protect against STDs, including AIDS.   Estrogen plus progesterone injections are given every 28 to 30 days. They can be given in the upper arm, thigh, or buttocks. It does not protect against STDs, including AIDS.   Intrauterine device (IUD): copper T or progestin filled is a T-shaped device that is put in a woman's uterus during a menstrual period to prevent pregnancy. The copper T IUD can last 10 years, and the progestin IUD can last 5 years. The progestin IUD can also help control heavy menstrual periods. It does not protect against STDs, including AIDS. The copper T IUD can be used as emergency contraception if inserted within 5 days of having unprotected intercourse.   Cervical cap is a round, soft latex or plastic cup that fits over the cervix and must be fitted by a caregiver. You do not need to use a spermicide with it or remove and insert it every time you have sexual intercourse. It does not protect against STDs, including AIDS.   Spermicides are chemicals that kill or block sperm from entering the cervix and uterus. They come in the form of creams, jellies, suppositories, foam, or tablets, and they do not require a prescription. They are inserted into the vagina with an applicator before having sexual intercourse. This must be repeated every time you have sexual intercourse.   Withdrawal is using the method of the female withdrawing his penis from sexual intercourse before he has a climax and deposits his sperm. It does not protect against STDs, including AIDS.   Female tubal ligation is when the woman's fallopian tubes are surgically sealed or tied to prevent the egg from traveling to the uterus. It does not protect against STDs, including AIDS.   Female sterilization is when the female has his tubes that carry sperm tied off (vasectomy) to  stop sperm from entering the vagina during sexual intercourse. It does not protect against STDs, including AIDS.  Regardless of which method of birth control  you choose, it is still important that you use some form of protection against STDs. Document Released: 03/04/2005 Document Revised: 04/06/2010 Document Reviewed: 01/19/2009 [facility] Patient Information 2012 [place], [place].

## 2011-05-01 NOTE — Progress Notes (Signed)
Doing well. Will get HIV Reflex today. No bleeding or contractions. Good FM.

## 2011-05-01 NOTE — Progress Notes (Signed)
P114,  C/o trace edema in feet/legs, c/o pressure or cramps in pelvic area when gets up

## 2011-05-08 ENCOUNTER — Ambulatory Visit (INDEPENDENT_AMBULATORY_CARE_PROVIDER_SITE_OTHER): Payer: Medicaid Other | Admitting: Family Medicine

## 2011-05-08 DIAGNOSIS — O0930 Supervision of pregnancy with insufficient antenatal care, unspecified trimester: Secondary | ICD-10-CM

## 2011-05-08 DIAGNOSIS — Z3490 Encounter for supervision of normal pregnancy, unspecified, unspecified trimester: Secondary | ICD-10-CM

## 2011-05-08 LAB — POCT URINALYSIS DIP (DEVICE)
Bilirubin Urine: NEGATIVE
Glucose, UA: NEGATIVE mg/dL
Hgb urine dipstick: NEGATIVE
Ketones, ur: NEGATIVE mg/dL
Leukocytes, UA: NEGATIVE
Nitrite: NEGATIVE
Protein, ur: NEGATIVE mg/dL
Specific Gravity, Urine: 1.015 (ref 1.005–1.030)
Urobilinogen, UA: 0.2 mg/dL (ref 0.0–1.0)
pH: 7 (ref 5.0–8.0)

## 2011-05-08 NOTE — Patient Instructions (Signed)
Birth Control Choices Birth control is the use of any practices, methods, or devices to prevent pregnancy from happening in a sexually active woman.  Below are some birth control choices to help avoid pregnancy.  Not having sex (abstinence) is the surest form of birth control. This requires self-control. There is no risk of acquiring a sexually transmitted disease (STD), including acquired immunodeficiency syndrome (AIDS).   Periodic abstinence requires self-control during certain times of the month.   Calendar method, timing your menstrual periods from month to month.   Ovulation method is avoiding sexual intercourse around the time you produce an egg (ovulate).   Symptotherm method is avoiding sexual intercourse at the time of ovulation, using a thermometer and ovulation symptoms.   Post ovulation method is the timing of sexual intercourse after you ovulated.  These methods do not protect against STDs, including AIDS.  Birth control pills (BCPs) contain estrogen and progesterone hormone. These medicines work by stopping the egg from forming in the ovary (ovulation). Birth control pills are prescribed by a caregiver who will ask you questions about the risks of taking BCPs. Birth control pills do not protect against STDs, including AIDS.   "Minipill" birth control pills have only the progesterone hormone. They are taken every day of each month and must be prescribed by your caregiver. They do not protect against STDs, including AIDS.   Emergency contraception is often call the "morning after" pill. This pill can be taken right after sex or up to five days after sex if you think your birth control failed, you failed to use contraception, or you were forced to have sex. It is most effective the sooner you take the pills after having sexual intercourse. Do not use emergency contraception as your only form of birth control. Emergency contraceptive pills are available without a prescription. Check  with your pharmacist.   Condoms are a thin sheath of latex, synthetic material, or lambskin worn over the penis during sexual intercourse. They can have a spermicide in or on them when you buy them. Latex condoms can prevent pregnancy and STDs. "Natural" or lambskin condoms can prevent pregnancy but may not protect against STDs, including AIDS.   Female condoms are a soft, loose-fitting sheath that is put into the vagina before sexual intercourse. They can prevent pregnancy and STDs, including AIDS.   Sponge is a soft, circular piece of polyurethane foam with spermicide in it that is inserted into the vagina after wetting it and before sexual intercourse. It does not require a prescription from your caregiver. It does not protect against STDs, including AIDS.   Diaphragm is a soft, latex, dome-shaped barrier that must be fitted by a caregiver. It is inserted into the vagina, along with a spermicidal jelly. After the proper fitting for a diaphragm, always insert the diaphragm before intercourse. The diaphragm should be left in the vagina for 6 to 8 hours after intercourse. Removal and reinsertion with a spermicide is always necessary after any use. It does not protect against STDs, including AIDS.   Progesterone-only injections are given every 3 months to prevent pregnancy. These injections contain synthetic progesterone and no estrogen. This hormone stops the ovaries from releasing eggs. It also causes the cervical mucus to thicken and changes the uterine lining. This makes it harder for sperm to survive in the uterus. It does not protect against STDs, including AIDS.   Birth Control Patch contains hormones similar to those in birth control pills, so effectiveness, risks, and side effects   are similar. It must be changed once a week and is prescribed by a caregiver. It is less effective in very overweight women. It does not protect against STDs, including AIDS.   Vaginal Ring contains hormones similar  to those in birth control pills. It is left in place for 3 weeks, removed for 1 week, and then a new one is put back into the vagina. It comes with a timer to put in your purse to help you remember when to take it out or put a new one in. A caregiver's examination and prescription is necessary, just like with birth control pills and the patch. It does not protect against STDs, including AIDS.   Estrogen plus progesterone injections are given every 28 to 30 days. They can be given in the upper arm, thigh, or buttocks. It does not protect against STDs, including AIDS.   Intrauterine device (IUD): copper T or progestin filled is a T-shaped device that is put in a woman's uterus during a menstrual period to prevent pregnancy. The copper T IUD can last 10 years, and the progestin IUD can last 5 years. The progestin IUD can also help control heavy menstrual periods. It does not protect against STDs, including AIDS. The copper T IUD can be used as emergency contraception if inserted within 5 days of having unprotected intercourse.   Cervical cap is a round, soft latex or plastic cup that fits over the cervix and must be fitted by a caregiver. You do not need to use a spermicide with it or remove and insert it every time you have sexual intercourse. It does not protect against STDs, including AIDS.   Spermicides are chemicals that kill or block sperm from entering the cervix and uterus. They come in the form of creams, jellies, suppositories, foam, or tablets, and they do not require a prescription. They are inserted into the vagina with an applicator before having sexual intercourse. This must be repeated every time you have sexual intercourse.   Withdrawal is using the method of the female withdrawing his penis from sexual intercourse before he has a climax and deposits his sperm. It does not protect against STDs, including AIDS.   Female tubal ligation is when the woman's fallopian tubes are surgically sealed  or tied to prevent the egg from traveling to the uterus. It does not protect against STDs, including AIDS.   Female sterilization is when the female has his tubes that carry sperm tied off (vasectomy) to stop sperm from entering the vagina during sexual intercourse. It does not protect against STDs, including AIDS.  Regardless of which method of birth control you choose, it is still important that you use some form of protection against STDs. Document Released: 03/04/2005 Document Revised: 04/06/2010 Document Reviewed: 01/19/2009 [employer] Patient Information 2012 [employer]. Breastfeeding BENEFITS OF BREASTFEEDING For the baby  The first milk (colostrum) helps the baby's digestive system function better.   There are antibodies from the mother in the milk that help the baby fight off infections.   The baby has a lower incidence of asthma, allergies, and SIDS (sudden infant death syndrome).   The nutrients in breast milk are better than formulas for the baby and helps the baby's brain grow better.   Babies who breastfeed have less gas, colic, and constipation.  For the mother  Breastfeeding helps develop a very special bond between mother and baby.   It is more convenient, always available at the correct temperature and cheaper than formula feeding.     It burns calories in the mother and helps with losing weight that was gained during pregnancy.   It makes the uterus contract back down to normal size faster and slows bleeding following delivery.   Breastfeeding mothers have a lower risk of developing breast cancer.  NURSE FREQUENTLY  A healthy, full-term baby may breastfeed as often as every hour or space his or her feedings to every 3 hours.   How often to nurse will vary from baby to baby. Watch your baby for signs of hunger, not the clock.   Nurse as often as the baby requests, or when you feel the need to reduce the fullness of your breasts.   Awaken the baby if it has been 3  to 4 hours since the last feeding.   Frequent feeding will help the mother make more milk and will prevent problems like sore nipples and engorgement of the breasts.  BABY'S POSITION AT THE BREAST  Whether lying down or sitting, be sure that the baby's tummy is facing your tummy.   Support the breast with 4 fingers underneath the breast and the thumb above. Make sure your fingers are well away from the nipple and baby's mouth.   Stroke the baby's lips and cheek closest to the breast gently with your finger or nipple.   When the baby's mouth is open wide enough, place all of your nipple and as much of the dark area around the nipple as possible into your baby's mouth.   Pull the baby in close so the tip of the nose and the baby's cheeks touch the breast during the feeding.  FEEDINGS  The length of each feeding varies from baby to baby and from feeding to feeding.   The baby must suck about 2 to 3 minutes for your milk to get to him or her. This is called a "let down." For this reason, allow the baby to feed on each breast as long as he or she wants. Your baby will end the feeding when he or she has received the right balance of nutrients.   To break the suction, put your finger into the corner of the baby's mouth and slide it between his or her gums before removing your breast from his or her mouth. This will help prevent sore nipples.  REDUCING BREAST ENGORGEMENT  In the first week after your baby is born, you may experience signs of breast engorgement. When breasts are engorged, they feel heavy, warm, full, and may be tender to the touch. You can reduce engorgement if you:   Nurse frequently, every 2 to 3 hours. Mothers who breastfeed early and often have fewer problems with engorgement.   Place light ice packs on your breasts between feedings. This reduces swelling. Wrap the ice packs in a lightweight towel to protect your skin.   Apply moist hot packs to your breast for 5 to 10  minutes before each feeding. This increases circulation and helps the milk flow.   Gently massage your breast before and during the feeding.   Make sure that the baby empties at least one breast at every feeding before switching sides.   Use a breast pump to empty the breasts if your baby is sleepy or not nursing well. You may also want to pump if you are returning to work or or you feel you are getting engorged.   Avoid bottle feeds, pacifiers or supplemental feedings of water or juice in place of breastfeeding.   Be sure   the baby is latched on and positioned properly while breastfeeding.   Prevent fatigue, stress, and anemia.   Wear a supportive bra, avoiding underwire styles.   Eat a balanced diet with enough fluids.  If you follow these suggestions, your engorgement should improve in 24 to 48 hours. If you are still experiencing difficulty, call your lactation consultant or caregiver. IS MY BABY GETTING ENOUGH MILK? Sometimes, mothers worry about whether their babies are getting enough milk. You can be assured that your baby is getting enough milk if:  The baby is actively sucking and you hear swallowing.   The baby nurses at least 8 to 12 times in a 24 hour time period. Nurse your baby until he or she unlatches or falls asleep at the first breast (at least 10 to 20 minutes), then offer the second side.   The baby is wetting 5 to 6 disposable diapers (6 to 8 cloth diapers) in a 24 hour period by 5 to 6 days of age.   The baby is having at least 2 to 3 stools every 24 hours for the first few months. Breast milk is all the food your baby needs. It is not necessary for your baby to have water or formula. In fact, to help your breasts make more milk, it is best not to give your baby supplemental feedings during the early weeks.   The stool should be soft and yellow.   The baby should gain 4 to 7 ounces per week after he is 4 days old.  TAKE CARE OF YOURSELF Take care of your breasts  by:  Bathing or showering daily.   Avoiding the use of soaps on your nipples.   Start feedings on your left breast at one feeding and on your right breast at the next feeding.   You will notice an increase in your milk supply 2 to 5 days after delivery. You may feel some discomfort from engorgement, which makes your breasts very firm and often tender. Engorgement "peaks" out within 24 to 48 hours. In the meantime, apply warm moist towels to your breasts for 5 to 10 minutes before feeding. Gentle massage and expression of some milk before feeding will soften your breasts, making it easier for your baby to latch on. Wear a well fitting nursing bra and air dry your nipples for 10 to 15 minutes after each feeding.   Only use cotton bra pads.   Only use pure lanolin on your nipples after nursing. You do not need to wash it off before nursing.  Take care of yourself by:   Eating well-balanced meals and nutritious snacks.   Drinking milk, fruit juice, and water to satisfy your thirst (about 8 glasses a day).   Getting plenty of rest.   Increasing calcium in your diet (1200 mg a day).   Avoiding foods that you notice affect the baby in a bad way.  SEEK MEDICAL CARE IF:   You have any questions or difficulty with breastfeeding.   You need help.   You have a hard, red, sore area on your breast, accompanied by a fever of 100.5 F (38.1 C) or more.   Your baby is too sleepy to eat well or is having trouble sleeping.   Your baby is wetting less than 6 diapers per day, by 5 days of age.   Your baby's skin or white part of his or her eyes is more yellow than it was in the hospital.   You feel   depressed.  Document Released: 03/04/2005 Document Revised: 11/14/2010 Document Reviewed: 10/17/2008 [employer] Patient Information 2012 [employer]. 

## 2011-05-08 NOTE — Progress Notes (Signed)
Doing well---All labs reviewed and negative from last visit.  Reports sporadic contractions, discussed breast feeding.

## 2011-05-12 ENCOUNTER — Encounter (HOSPITAL_COMMUNITY): Payer: Self-pay

## 2011-05-12 ENCOUNTER — Inpatient Hospital Stay (HOSPITAL_COMMUNITY)
Admission: AD | Admit: 2011-05-12 | Discharge: 2011-05-12 | Disposition: A | Discharge status: Home / Self Care | Payer: Medicaid Other | Source: Ambulatory Visit | Attending: Obstetrics & Gynecology | Admitting: Obstetrics & Gynecology

## 2011-05-12 DIAGNOSIS — O479 False labor, unspecified: Secondary | ICD-10-CM | POA: Insufficient documentation

## 2011-05-12 MED ORDER — OXYCODONE-ACETAMINOPHEN 5-325 MG PO TABS
2.0000 | ORAL_TABLET | Freq: Once | ORAL | Status: AC
  Administered 2011-05-12: 2 via ORAL
  Filled 2011-05-12: qty 2

## 2011-05-12 MED ORDER — ZOLPIDEM TARTRATE 10 MG PO TABS: 10.0000 mg | ORAL_TABLET | Freq: Every evening | ORAL | Status: DC | PRN

## 2011-05-12 NOTE — Discharge Instructions (Signed)
Normal Labor and Delivery °Your caregiver must first be sure you are in labor. Signs of labor include: °· You may pass what is called "the mucus plug" before labor begins. This is a small amount of blood stained mucus.  °· Regular uterine contractions.  °· The time between contractions get closer together.  °· The discomfort and pain gradually gets more intense.  °· Pains are mostly located in the back.  °· Pains get worse when walking.  °· The cervix (the opening of the uterus becomes thinner (begins to efface) and opens up (dilates).  °Once you are in labor and admitted into the hospital or care center, your caregiver will do the following: °· A complete physical examination.  °· Check your vital signs (blood pressure, pulse, temperature and the fetal heart rate).  °· Do a vaginal examination (using a sterile glove and lubricant) to determine:  °· The position (presentation) of the baby (head [vertex] or buttock first).  °· The level (station) of the baby's head in the birth canal.  °· The effacement and dilatation of the cervix.  °· You may have your pubic hair shaved and be given an enema depending on your caregiver and the circumstance.  °· An electronic monitor is usually placed on your abdomen. The monitor follows the length and intensity of the contractions, as well as the baby's heart rate.  °· Usually, your caregiver will insert an IV in your arm with a bottle of sugar water. This is done as a precaution so that medications can be given to you quickly during labor or delivery.  °NORMAL LABOR AND DELIVERY IS DIVIDED UP INTO 3 STAGES: °First Stage °This is when regular contractions begin and the cervix begins to efface and dilate. This stage can last from 3 to 15 hours. The end of the first stage is when the cervix is 100% effaced and 10 centimeters dilated. Pain medications may be given by  °· Injection (morphine, demerol, etc.)  °· Regional anesthesia (spinal, caudal or epidural, anesthetics given in  different locations of the spine). Paracervical pain medication may be given, which is an injection of and anesthetic on each side of the cervix.  °A pregnant woman may request to have "Natural Childbirth" which is not to have any medications or anesthesia during her labor and delivery. °Second Stage °This is when the baby comes down through the birth canal (vagina) and is born. This can take 1 to 4 hours. As the baby's head comes down through the birth canal, you may feel like you are going to have a bowel movement. You will get the urge to bear down and push until the baby is delivered. As the baby's head is being delivered, the caregiver will decide if an episiotomy (a cut in the perineum and vagina area) is needed to prevent tearing of the tissue in this area. The episiotomy is sewn up after the delivery of the baby and placenta. Sometimes a mask with nitrous oxide is given for the mother to breath during the delivery of the baby to help if there is too much pain. The end of Stage 2 is when the baby is fully delivered. Then when the umbilical cord stops pulsating it is clamped and cut. °Third Stage °The third stage begins after the baby is completely delivered and ends after the placenta (afterbirth) is delivered. This usually takes 5 to 30 minutes. After the placenta is delivered, a medication is given either by intravenous or injection to help contract   the uterus and prevent bleeding. The third stage is not painful and pain medication is usually not necessary. If an episiotomy was done, it is repaired at this time. °After the delivery, the mother is watched and monitored closely for 1 to 2 hours to make sure there is no postpartum bleeding (hemorrhage). If there is a lot of bleeding, medication is given to contract the uterus and stop the bleeding. °Document Released: 12/12/2007 Document Revised: 11/14/2010 Document Reviewed: 12/12/2007 °[employer]® Patient Information ©2012 [employer]. °

## 2011-05-12 NOTE — ED Provider Notes (Signed)
History     Chief Complaint  Patient presents with  . Contractions   HPI This is a 21 y.o. at [redacted]w[redacted]d who presents for labor evaluation. States UCs are stronger. Denies leaking or bleeding. +FM. OB History    Grav Para Term Preterm Abortions TAB SAB Ect Mult Living   1 0 0 0 0 0 0 0 0 0       Past Medical History  Diagnosis Date  . No pertinent past medical history     Past Surgical History  Procedure Date  . No past surgeries     Family History  Problem Relation Age of Onset  . Asthma Mother   . Hypertension Mother   . Diabetes Maternal Grandmother   . Hypertension Maternal Grandmother   . Stroke Maternal Grandmother   . Anesthesia problems Neg Hx   . Malignant hyperthermia Neg Hx   . Pseudochol deficiency Neg Hx   . Hypotension Neg Hx     History  Substance Use Topics  . Smoking status: Never Smoker   . Smokeless tobacco: Never Used  . Alcohol Use: No    Allergies:  Allergies  Allergen Reactions  . Amoxicillin Nausea And Vomiting and Other (See Comments)    Tics & twitches    Prescriptions prior to admission  Medication Sig Dispense Refill  . butalbital-acetaminophen-caffeine (FIORICET, ESGIC) 50-325-40 MG per tablet Take 1-2 tablets by mouth every 6 (six) hours as needed. For headache      . cyclobenzaprine (FLEXERIL) 10 MG tablet Take 10 mg by mouth every 8 (eight) hours as needed. For muscle spasms      . pantoprazole (PROTONIX) 40 MG tablet Take 40 mg by mouth 2 (two) times daily.        ROS As above  Physical Exam   Blood pressure 122/79, pulse 126, temperature 98.9 F (37.2 C), temperature source Oral, resp. rate 18, height 5\' 1"  (1.549 m), weight 219 lb 12.8 oz (99.701 kg).  Physical Exam  Constitutional: She is oriented to person, place, and time. She appears well-developed and well-nourished. No distress.  HENT:  Head: Normocephalic.  Cardiovascular: Normal rate.   Respiratory: Effort normal.  GI: Soft. She exhibits no distension and no  mass. There is no tenderness. There is no rebound and no guarding.  Genitourinary: Vagina normal and uterus normal. No vaginal discharge found.       Cervix unchanged after 2+ hrs at 2/80/-2.  Musculoskeletal: Normal range of motion.  Neurological: She is alert and oriented to person, place, and time.  Skin: Skin is warm and dry.  Psychiatric: She has a normal mood and affect.   FHR reactive with irregular contractions.    MAU Course  Procedures  Assessment and Plan  A:  False or prodromal labor       Reassuring fetal status P:  D/C home with labor precautions      Rx Ambien  [facility] 05/12/2011, 5:17 PM

## 2011-05-12 NOTE — ED Notes (Signed)
Removed patient from efm to walk around.

## 2011-05-12 NOTE — Progress Notes (Signed)
Pt reports having contractions x 2 hours. Reports good fetal movement and denies SROM or bleeding.

## 2011-05-12 NOTE — ED Notes (Signed)
Patient vomited after given Percocet

## 2011-05-15 ENCOUNTER — Ambulatory Visit (INDEPENDENT_AMBULATORY_CARE_PROVIDER_SITE_OTHER): Payer: Medicaid Other | Admitting: Physician Assistant

## 2011-05-15 ENCOUNTER — Inpatient Hospital Stay (HOSPITAL_COMMUNITY)
Admission: AD | Admit: 2011-05-15 | Discharge: 2011-05-15 | Disposition: A | Discharge status: Home / Self Care | Payer: Medicaid Other | Source: Ambulatory Visit | Attending: Obstetrics and Gynecology | Admitting: Obstetrics and Gynecology

## 2011-05-15 ENCOUNTER — Encounter (HOSPITAL_COMMUNITY): Payer: Self-pay | Admitting: *Deleted

## 2011-05-15 DIAGNOSIS — O26839 Pregnancy related renal disease, unspecified trimester: Secondary | ICD-10-CM

## 2011-05-15 DIAGNOSIS — R809 Proteinuria, unspecified: Secondary | ICD-10-CM

## 2011-05-15 DIAGNOSIS — O0930 Supervision of pregnancy with insufficient antenatal care, unspecified trimester: Secondary | ICD-10-CM

## 2011-05-15 DIAGNOSIS — O1210 Gestational proteinuria, unspecified trimester: Secondary | ICD-10-CM

## 2011-05-15 DIAGNOSIS — O4690 Antepartum hemorrhage, unspecified, unspecified trimester: Secondary | ICD-10-CM | POA: Insufficient documentation

## 2011-05-15 DIAGNOSIS — N289 Disorder of kidney and ureter, unspecified: Secondary | ICD-10-CM

## 2011-05-15 LAB — COMPREHENSIVE METABOLIC PANEL
ALT: 8 U/L (ref 0–35)
AST: 18 U/L (ref 0–37)
Albumin: 3.5 g/dL (ref 3.5–5.2)
Alkaline Phosphatase: 235 U/L — ABNORMAL HIGH (ref 39–117)
BUN: 9 mg/dL (ref 6–23)
CO2: 15 mEq/L — ABNORMAL LOW (ref 19–32)
Calcium: 9.1 mg/dL (ref 8.4–10.5)
Chloride: 106 mEq/L (ref 96–112)
Creat: 0.58 mg/dL (ref 0.50–1.10)
Glucose, Bld: 94 mg/dL (ref 70–99)
Potassium: 3.9 mEq/L (ref 3.5–5.3)
Sodium: 136 mEq/L (ref 135–145)
Total Bilirubin: 0.4 mg/dL (ref 0.3–1.2)
Total Protein: 6.6 g/dL (ref 6.0–8.3)

## 2011-05-15 LAB — CBC
HCT: 32.9 % — ABNORMAL LOW (ref 36.0–46.0)
Hemoglobin: 10 g/dL — ABNORMAL LOW (ref 12.0–15.0)
MCH: 22.9 pg — ABNORMAL LOW (ref 26.0–34.0)
MCHC: 30.4 g/dL (ref 30.0–36.0)
MCV: 75.5 fL — ABNORMAL LOW (ref 78.0–100.0)
Platelets: 376 10*3/uL (ref 150–400)
RBC: 4.36 MIL/uL (ref 3.87–5.11)
RDW: 15.5 % (ref 11.5–15.5)
WBC: 11.5 10*3/uL — ABNORMAL HIGH (ref 4.0–10.5)

## 2011-05-15 LAB — POCT URINALYSIS DIP (DEVICE)
Bilirubin Urine: NEGATIVE
Glucose, UA: NEGATIVE mg/dL
Hgb urine dipstick: NEGATIVE
Ketones, ur: 40 mg/dL — AB
Nitrite: NEGATIVE
Protein, ur: 100 mg/dL — AB
Specific Gravity, Urine: >=1.03 (ref 1.005–1.030)
Urobilinogen, UA: 0.2 mg/dL (ref 0.0–1.0)
pH: 6.5 (ref 5.0–8.0)

## 2011-05-15 NOTE — Progress Notes (Signed)
Tired of being preg. Proteinuria today. Will check labs. Membranes swept. Pre-x precautions.

## 2011-05-15 NOTE — Progress Notes (Signed)
Patient states she was in the [facility] this am and was 2-3 cm. States she had sudden onset of vaginal bleeding and when she went to the BR was slimy dark red bleeding with brighter bleeding in the toilet water. Patient is not wearing a pad on arrival in MAU and no bleeding on panties patient wore in. Reports good fetal movement.

## 2011-05-15 NOTE — Patient Instructions (Signed)

## 2011-05-15 NOTE — ED Provider Notes (Signed)
Chief Complaint:  Vaginal Bleeding   HPI  Emma Chambers is  21 y.o. G1P0000 at [redacted]w[redacted]d presents with vaginal bleeding.  She reports walking for a couple of hours after her clinic visit today, then seeing blood in the toilet after urinating, and bright red blood on the toilet paper when she wiped.  She denies wearing a pad for bleeding now and notes some scant pink spotting in her underwear.  She reports good fetal movement, denies regular contractions, LOF, vaginal itching/discharge, h/a, visual disturbances, epigastric pain, dizziness, or fever/chills.     Obstetrical/Gynecological History: OB History    Grav Para Term Preterm Abortions TAB SAB Ect Mult Living   1 0 0 0 0 0 0 0 0 0       Past Medical History: Past Medical History  Diagnosis Date  . No pertinent past medical history     Past Surgical History: History reviewed. No pertinent past surgical history.  Family History: Family History  Problem Relation Age of Onset  . Asthma Mother   . Hypertension Mother   . Diabetes Maternal Grandmother   . Hypertension Maternal Grandmother   . Stroke Maternal Grandmother   . Anesthesia problems Neg Hx   . Malignant hyperthermia Neg Hx   . Pseudochol deficiency Neg Hx   . Hypotension Neg Hx     Social History: History  Substance Use Topics  . Smoking status: Never Smoker   . Smokeless tobacco: Never Used  . Alcohol Use: No    Allergies:  Allergies  Allergen Reactions  . Amoxicillin Nausea And Vomiting and Other (See Comments)    Tics & twitches    Meds:  Prescriptions prior to admission  Medication Sig Dispense Refill  . butalbital-acetaminophen-caffeine (FIORICET, ESGIC) 50-325-40 MG per tablet Take 1-2 tablets by mouth every 6 (six) hours as needed. For headache      . cyclobenzaprine (FLEXERIL) 10 MG tablet Take 10 mg by mouth every 8 (eight) hours as needed. For muscle spasms      . pantoprazole (PROTONIX) 40 MG tablet Take 40 mg by mouth 2 (two) times daily.       Marland Kitchen zolpidem (AMBIEN) 10 MG tablet Take 1 tablet (10 mg total) by mouth at bedtime as needed for sleep.  20 tablet  0    Review of Systems See HPI   Physical Exam  BP 99/71  Pulse 123  Temp(Src) 98.6 F (37 C) (Oral)  Resp 18  Ht 5' 0.5" (1.537 m)  Wt 99.791 kg (220 lb)  BMI 42.26 kg/m2  SpO2 100% GENERAL: Well-developed, well-nourished female in no acute distress.  LUNGS: Clear to auscultation bilaterally.  HEART: Regular rate and rhythm. ABDOMEN: Soft, nontender, nondistended, gravid.  EXTREMITIES: Nontender, no edema, 2+ distal pulses. Cervical Exam: 3/75/-2, posterior  Pelvic exam: Cervix pink without lesion, pink tinged mucous in vagina, no active bleeding from cervical os Presentation: cephalic FHT:  Baseline rate 135 bpm   Variability moderate  Accelerations present   Decelerations none Contractions: 2 in 45 minutes   Labs: Not indicated Imaging Studies:  Not indicated  Assessment/Plan: A: Vaginal bleeding in pregnancy  P: D/C home with labor precautions Return to MAU as needed   LEFTWICH-KIRBY, Nykeem Citro 2/27/20137:00 PM

## 2011-05-15 NOTE — Discharge Instructions (Signed)
Normal Labor and Delivery °Your caregiver must first be sure you are in labor. Signs of labor include: °· You may pass what is called "the mucus plug" before labor begins. This is a small amount of blood stained mucus.  °· Regular uterine contractions.  °· The time between contractions get closer together.  °· The discomfort and pain gradually gets more intense.  °· Pains are mostly located in the back.  °· Pains get worse when walking.  °· The cervix (the opening of the uterus becomes thinner (begins to efface) and opens up (dilates).  °Once you are in labor and admitted into the hospital or care center, your caregiver will do the following: °· A complete physical examination.  °· Check your vital signs (blood pressure, pulse, temperature and the fetal heart rate).  °· Do a vaginal examination (using a sterile glove and lubricant) to determine:  °· The position (presentation) of the baby (head [vertex] or buttock first).  °· The level (station) of the baby's head in the birth canal.  °· The effacement and dilatation of the cervix.  °· You may have your pubic hair shaved and be given an enema depending on your caregiver and the circumstance.  °· An electronic monitor is usually placed on your abdomen. The monitor follows the length and intensity of the contractions, as well as the baby's heart rate.  °· Usually, your caregiver will insert an IV in your arm with a bottle of sugar water. This is done as a precaution so that medications can be given to you quickly during labor or delivery.  °NORMAL LABOR AND DELIVERY IS DIVIDED UP INTO 3 STAGES: °First Stage °This is when regular contractions begin and the cervix begins to efface and dilate. This stage can last from 3 to 15 hours. The end of the first stage is when the cervix is 100% effaced and 10 centimeters dilated. Pain medications may be given by  °· Injection (morphine, demerol, etc.)  °· Regional anesthesia (spinal, caudal or epidural, anesthetics given in  different locations of the spine). Paracervical pain medication may be given, which is an injection of and anesthetic on each side of the cervix.  °A pregnant woman may request to have "Natural Childbirth" which is not to have any medications or anesthesia during her labor and delivery. °Second Stage °This is when the baby comes down through the birth canal (vagina) and is born. This can take 1 to 4 hours. As the baby's head comes down through the birth canal, you may feel like you are going to have a bowel movement. You will get the urge to bear down and push until the baby is delivered. As the baby's head is being delivered, the caregiver will decide if an episiotomy (a cut in the perineum and vagina area) is needed to prevent tearing of the tissue in this area. The episiotomy is sewn up after the delivery of the baby and placenta. Sometimes a mask with nitrous oxide is given for the mother to breath during the delivery of the baby to help if there is too much pain. The end of Stage 2 is when the baby is fully delivered. Then when the umbilical cord stops pulsating it is clamped and cut. °Third Stage °The third stage begins after the baby is completely delivered and ends after the placenta (afterbirth) is delivered. This usually takes 5 to 30 minutes. After the placenta is delivered, a medication is given either by intravenous or injection to help contract   the uterus and prevent bleeding. The third stage is not painful and pain medication is usually not necessary. If an episiotomy was done, it is repaired at this time. °After the delivery, the mother is watched and monitored closely for 1 to 2 hours to make sure there is no postpartum bleeding (hemorrhage). If there is a lot of bleeding, medication is given to contract the uterus and stop the bleeding. °Document Released: 12/12/2007 Document Revised: 11/14/2010 Document Reviewed: 12/12/2007 °[employer]® Patient Information ©2012 [employer]. °

## 2011-05-15 NOTE — Progress Notes (Signed)
Pulse: 130

## 2011-05-16 LAB — PROTEIN / CREATININE RATIO, URINE
Creatinine, Urine: 205.6 mg/dL
Protein Creatinine Ratio: 0.2 — ABNORMAL HIGH (ref <0.15)
Total Protein, Urine: 42 mg/dL

## 2011-05-17 ENCOUNTER — Encounter (HOSPITAL_COMMUNITY): Payer: Self-pay

## 2011-05-17 ENCOUNTER — Inpatient Hospital Stay (HOSPITAL_COMMUNITY)
Admission: AD | Admit: 2011-05-17 | Discharge: 2011-05-18 | Disposition: A | Discharge status: Home Health | Payer: Medicaid Other | Source: Ambulatory Visit | Attending: Obstetrics & Gynecology | Admitting: Obstetrics & Gynecology

## 2011-05-17 DIAGNOSIS — O479 False labor, unspecified: Secondary | ICD-10-CM

## 2011-05-17 DIAGNOSIS — R109 Unspecified abdominal pain: Secondary | ICD-10-CM | POA: Insufficient documentation

## 2011-05-17 DIAGNOSIS — O99891 Other specified diseases and conditions complicating pregnancy: Secondary | ICD-10-CM | POA: Insufficient documentation

## 2011-05-17 MED ORDER — HYDROXYZINE HCL 50 MG/ML IM SOLN
50.0000 mg | Freq: Once | INTRAMUSCULAR | Status: AC
  Administered 2011-05-17: 50 mg via INTRAMUSCULAR
  Filled 2011-05-17: qty 1

## 2011-05-17 MED ORDER — HYDROXYZINE PAMOATE 25 MG PO CAPS: 25.0000 mg | ORAL_CAPSULE | Freq: Three times a day (TID) | ORAL | Status: DC | PRN

## 2011-05-17 MED ORDER — NALBUPHINE HCL 10 MG/ML IJ SOLN
5.0000 mg | Freq: Once | INTRAMUSCULAR | Status: AC
  Administered 2011-05-17: 5 mg via INTRAMUSCULAR
  Filled 2011-05-17: qty 0.5

## 2011-05-17 NOTE — Progress Notes (Signed)
Patient is here for labor eval, c/o more painful contraction, leaking fluids and little spotting. She reports good fetal movement.

## 2011-05-17 NOTE — ED Provider Notes (Signed)
History     No chief complaint on file.  HPI Patient is a 21 yo G1 at [redacted]w[redacted]d presenting to MAU for increased leaking and increased pain. She states she has been hurting for last 2 days. Increased mucus-like discharge and some bloody show today. Patient reports good fetal movement.  Prenatal care at [facility].  OB History    Grav Para Term Preterm Abortions TAB SAB Ect Mult Living   1 0 0 0 0 0 0 0 0 0       Past Medical History  Diagnosis Date  . No pertinent past medical history     History reviewed. No pertinent past surgical history.  Family History  Problem Relation Age of Onset  . Asthma Mother   . Hypertension Mother   . Diabetes Maternal Grandmother   . Hypertension Maternal Grandmother   . Stroke Maternal Grandmother   . Anesthesia problems Neg Hx   . Malignant hyperthermia Neg Hx   . Pseudochol deficiency Neg Hx   . Hypotension Neg Hx     History  Substance Use Topics  . Smoking status: Never Smoker   . Smokeless tobacco: Never Used  . Alcohol Use: No    Allergies:  Allergies  Allergen Reactions  . Amoxicillin Nausea And Vomiting and Other (See Comments)    Tics & twitches    Prescriptions prior to admission  Medication Sig Dispense Refill  . butalbital-acetaminophen-caffeine (FIORICET, ESGIC) 50-325-40 MG per tablet Take 1-2 tablets by mouth every 6 (six) hours as needed. For headache      . cyclobenzaprine (FLEXERIL) 10 MG tablet Take 10 mg by mouth every 8 (eight) hours as needed. For muscle spasms      . pantoprazole (PROTONIX) 40 MG tablet Take 40 mg by mouth 2 (two) times daily.      Marland Kitchen zolpidem (AMBIEN) 10 MG tablet Take 1 tablet (10 mg total) by mouth at bedtime as needed for sleep.  20 tablet  0    Review of Systems  Constitutional: Negative for fever and chills.  Respiratory: Negative for shortness of breath.   Cardiovascular: Negative for chest pain.  Gastrointestinal: Positive for abdominal pain.  Genitourinary: Negative for dysuria.  Skin:  Negative for rash.  Neurological: Positive for headaches.   Physical Exam   Blood pressure 129/79, pulse 122, temperature 98.8 F (37.1 C), temperature source Oral, resp. rate 18, height 5' (1.524 m), weight 99.338 kg (219 lb), SpO2 100.00%.  Physical Exam  Constitutional: She appears well-developed and well-nourished. She appears distressed (Appears uncomfortable).  HENT:  Head: Normocephalic and atraumatic.  Cardiovascular: Normal rate, regular rhythm and normal heart sounds.   Respiratory: Effort normal and breath sounds normal.  GI: Soft.       Gravid, toco in place  Genitourinary: Vagina normal.       Speculum exam: Mucus in vault. No bleeding or pooling noted. Cervical exam: 3cm/70%  Musculoskeletal: Normal range of motion. She exhibits no edema.  Neurological: She is alert.    MAU Course  Procedures Ferning slide: Negative  FHT: baseline in 140's with moderate variability. Irregular contractions.   Assessment and Plan  21 yo G1 at [redacted]w[redacted]d presenting for increased pain - Ferning negative - Cervix unchanged from check in [facility] - Patient is in pain; given nubain and vistaril which did help. - Discharged home in stable medical condition with Vistaril prn for sleep . - If anything changes, including gush or fluid, more regular contractions, etc. She will return to the MAU  to be evaluated - Will follow up with [facility] as scheduled - Plan discussed and patient examined with Dorathy Kinsman, CNM.  Mayerly Kaman 05/17/2011, 10:27 PM

## 2011-05-20 ENCOUNTER — Inpatient Hospital Stay (HOSPITAL_COMMUNITY)
Admission: AD | Admit: 2011-05-20 | Discharge: 2011-05-20 | Disposition: A | Discharge status: Home / Self Care | Payer: Medicaid Other | Attending: Obstetrics & Gynecology | Admitting: Obstetrics & Gynecology

## 2011-05-20 ENCOUNTER — Encounter (HOSPITAL_COMMUNITY): Payer: Self-pay | Admitting: *Deleted

## 2011-05-20 ENCOUNTER — Encounter (HOSPITAL_COMMUNITY): Payer: Self-pay | Admitting: Anesthesiology

## 2011-05-20 ENCOUNTER — Encounter (HOSPITAL_COMMUNITY): Payer: Self-pay

## 2011-05-20 ENCOUNTER — Inpatient Hospital Stay (HOSPITAL_COMMUNITY)
Admission: AD | Admit: 2011-05-20 | Discharge: 2011-05-22 | DRG: 775 | Disposition: A | Discharge status: Home / Self Care | Payer: Medicaid Other | Source: Ambulatory Visit | Attending: Obstetrics and Gynecology | Admitting: Obstetrics and Gynecology

## 2011-05-20 ENCOUNTER — Inpatient Hospital Stay (HOSPITAL_COMMUNITY): Payer: Medicaid Other | Admitting: Anesthesiology

## 2011-05-20 DIAGNOSIS — O9903 Anemia complicating the puerperium: Secondary | ICD-10-CM

## 2011-05-20 DIAGNOSIS — R000 Tachycardia, unspecified: Secondary | ICD-10-CM | POA: Diagnosis not present

## 2011-05-20 DIAGNOSIS — D649 Anemia, unspecified: Secondary | ICD-10-CM | POA: Diagnosis not present

## 2011-05-20 DIAGNOSIS — O479 False labor, unspecified: Secondary | ICD-10-CM

## 2011-05-20 DIAGNOSIS — O99892 Other specified diseases and conditions complicating childbirth: Secondary | ICD-10-CM

## 2011-05-20 DIAGNOSIS — O9989 Other specified diseases and conditions complicating pregnancy, childbirth and the puerperium: Secondary | ICD-10-CM

## 2011-05-20 LAB — CBC
HCT: 31.2 % — ABNORMAL LOW (ref 36.0–46.0)
HCT: 24.6 % — ABNORMAL LOW (ref 36.0–46.0)
Hemoglobin: 9.9 g/dL — ABNORMAL LOW (ref 12.0–15.0)
Hemoglobin: 7.9 g/dL — ABNORMAL LOW (ref 12.0–15.0)
MCH: 23.4 pg — ABNORMAL LOW (ref 26.0–34.0)
MCH: 23.9 pg — ABNORMAL LOW (ref 26.0–34.0)
MCHC: 31.7 g/dL (ref 30.0–36.0)
MCHC: 32.5 g/dL (ref 30.0–36.0)
MCV: 73.8 fL — ABNORMAL LOW (ref 78.0–100.0)
MCV: 73.4 fL — ABNORMAL LOW (ref 78.0–100.0)
Platelets: 366 10*3/uL (ref 150–400)
Platelets: 332 10*3/uL (ref 150–400)
RBC: 4.23 MIL/uL (ref 3.87–5.11)
RBC: 3.35 MIL/uL — ABNORMAL LOW (ref 3.87–5.11)
RDW: 16 % — ABNORMAL HIGH (ref 11.5–15.5)
RDW: 16.4 % — ABNORMAL HIGH (ref 11.5–15.5)
WBC: 21.5 10*3/uL — ABNORMAL HIGH (ref 4.0–10.5)
WBC: 27.6 10*3/uL — ABNORMAL HIGH (ref 4.0–10.5)

## 2011-05-20 LAB — RPR: RPR Ser Ql: NONREACTIVE

## 2011-05-20 MED ORDER — FENTANYL 2.5 MCG/ML BUPIVACAINE 1/10 % EPIDURAL INFUSION (WH - ANES)
14.0000 mL/h | INTRAMUSCULAR | Status: DC
  Administered 2011-05-20: 14 mL/h via EPIDURAL
  Filled 2011-05-20: qty 60

## 2011-05-20 MED ORDER — DIPHENHYDRAMINE HCL 50 MG/ML IJ SOLN: 12.5000 mg | INTRAMUSCULAR | Status: DC | PRN

## 2011-05-20 MED ORDER — OXYCODONE-ACETAMINOPHEN 5-325 MG PO TABS
1.0000 | ORAL_TABLET | Freq: Once | ORAL | Status: AC
  Administered 2011-05-20: 1 via ORAL
  Filled 2011-05-20: qty 1

## 2011-05-20 MED ORDER — LIDOCAINE HCL (PF) 1 % IJ SOLN
30.0000 mL | INTRAMUSCULAR | Status: DC | PRN
  Filled 2011-05-20: qty 30

## 2011-05-20 MED ORDER — PHENYLEPHRINE 40 MCG/ML (10ML) SYRINGE FOR IV PUSH (FOR BLOOD PRESSURE SUPPORT)
80.0000 ug | PREFILLED_SYRINGE | INTRAVENOUS | Status: DC | PRN
  Filled 2011-05-20: qty 5

## 2011-05-20 MED ORDER — CITRIC ACID-SODIUM CITRATE 334-500 MG/5ML PO SOLN: 30.0000 mL | ORAL | Status: DC | PRN

## 2011-05-20 MED ORDER — LIDOCAINE HCL (PF) 1 % IJ SOLN
INTRAMUSCULAR | Status: DC | PRN
  Administered 2011-05-20: 4 mL
  Administered 2011-05-20: 30 mL
  Administered 2011-05-20 (×3): 4 mL

## 2011-05-20 MED ORDER — IBUPROFEN 600 MG PO TABS
600.0000 mg | ORAL_TABLET | Freq: Four times a day (QID) | ORAL | Status: DC | PRN
  Administered 2011-05-20 – 2011-05-21 (×3): 600 mg via ORAL
  Filled 2011-05-20 (×6): qty 1

## 2011-05-20 MED ORDER — ONDANSETRON HCL 4 MG/2ML IJ SOLN: 4.0000 mg | Freq: Four times a day (QID) | INTRAMUSCULAR | Status: DC | PRN

## 2011-05-20 MED ORDER — LACTATED RINGERS IV SOLN
INTRAVENOUS | Status: DC
  Administered 2011-05-20 (×2): via INTRAVENOUS

## 2011-05-20 MED ORDER — LACTATED RINGERS IV SOLN
500.0000 mL | INTRAVENOUS | Status: DC | PRN
  Administered 2011-05-21: 1000 mL via INTRAVENOUS

## 2011-05-20 MED ORDER — EPHEDRINE 5 MG/ML INJ
10.0000 mg | INTRAVENOUS | Status: DC | PRN
  Filled 2011-05-20: qty 4

## 2011-05-20 MED ORDER — MISOPROSTOL 200 MCG PO TABS
800.0000 ug | ORAL_TABLET | Freq: Once | ORAL | Status: AC
  Administered 2011-05-20: 800 ug via RECTAL
  Filled 2011-05-20: qty 4

## 2011-05-20 MED ORDER — EPHEDRINE 5 MG/ML INJ: 10.0000 mg | INTRAVENOUS | Status: DC | PRN

## 2011-05-20 MED ORDER — LACTATED RINGERS IV SOLN: 500.0000 mL | Freq: Once | INTRAVENOUS | Status: DC

## 2011-05-20 MED ORDER — PHENYLEPHRINE 40 MCG/ML (10ML) SYRINGE FOR IV PUSH (FOR BLOOD PRESSURE SUPPORT): 80.0000 ug | PREFILLED_SYRINGE | INTRAVENOUS | Status: DC | PRN

## 2011-05-20 MED ORDER — FLEET ENEMA 7-19 GM/118ML RE ENEM: 1.0000 | ENEMA | RECTAL | Status: DC | PRN

## 2011-05-20 MED ORDER — OXYTOCIN 20 UNITS IN LACTATED RINGERS INFUSION - SIMPLE
125.0000 mL/h | Freq: Once | INTRAVENOUS | Status: AC
  Administered 2011-05-20: 125 mL/h via INTRAVENOUS

## 2011-05-20 MED ORDER — OXYTOCIN BOLUS FROM INFUSION
500.0000 mL | Freq: Once | INTRAVENOUS | Status: DC
  Filled 2011-05-20: qty 500
  Filled 2011-05-20: qty 1000

## 2011-05-20 MED ORDER — ACETAMINOPHEN 325 MG PO TABS
650.0000 mg | ORAL_TABLET | ORAL | Status: DC | PRN
  Administered 2011-05-21: 650 mg via ORAL
  Filled 2011-05-20: qty 2

## 2011-05-20 MED ORDER — OXYCODONE-ACETAMINOPHEN 5-325 MG PO TABS: 1.0000 | ORAL_TABLET | ORAL | Status: DC | PRN

## 2011-05-20 NOTE — H&P (Signed)
Emma Chambers is a 21 y.o. female presenting for at 40weeks 3 days for contractions. Was here earlier this morning. Gush of fluid in bathroom upon arrival. Prenatal care at [facility].  History OB History    Grav Para Term Preterm Abortions TAB SAB Ect Mult Living   1 0 0 0 0 0 0 0 0 0      Past Medical History  Diagnosis Date  . No pertinent past medical history    History reviewed. No pertinent past surgical history. Family History: family history includes Asthma in her mother; Diabetes in her maternal grandmother; Hypertension in her maternal grandmother and mother; and Stroke in her maternal grandmother.  There is no history of Anesthesia problems, and Malignant hyperthermia, and Pseudochol deficiency, and Hypotension, . Social History:  reports that she has never smoked. She has never used smokeless tobacco. She reports that she does not drink alcohol or use illicit drugs.  ROS  Dilation: 9 Effacement (%): 100 Station: 0 Exam by:: Walita, cnm There were no vitals taken for this visit. Maternal Exam:  Uterine Assessment: Contraction strength is firm.  Introitus: Vaginal discharge: bloody show.    Physical Exam  Constitutional: She is oriented to person, place, and time. She appears well-developed and well-nourished. No distress.  HENT:  Head: Normocephalic.  Neck: Normal range of motion. Neck supple.  Cardiovascular: Normal rate, regular rhythm and normal heart sounds.  Exam reveals no gallop and no friction rub.   No murmur heard. Respiratory: Effort normal and breath sounds normal. No respiratory distress.  GI: She exhibits no mass. There is no tenderness. There is no rebound, no guarding and no CVA tenderness.  Genitourinary: Uterus is enlarged. Cervix exhibits no motion tenderness and no discharge. Vaginal discharge: bloody show.  Musculoskeletal: Normal range of motion.  Neurological: She is alert and oriented to person, place, and time.  Skin: Skin is warm and dry.    Psychiatric: She has a normal mood and affect.    Prenatal labs: ABO, Rh: --/--/A POS (11/09 1249) Antibody: NEG (11/09 1249) Rubella: 66.8 (11/09 1249) RPR: NON REACTIVE (11/09 1249)  HBsAg: NEGATIVE (11/09 1249)  HIV: NON REACTIVE (02/13 0942)  GBS:   negative  EDD 05/17/11 based on ultrasound at 26 weeks.  Assessment/Plan: [redacted]w[redacted]d here for contractions Active labor Admit to birthing suites   [facility] 05/20/2011, 4:56 PM

## 2011-05-20 NOTE — ED Provider Notes (Signed)
History     Chief Complaint  Patient presents with  . Labor Eval   HPI Patient is a 21 yo G1P0 at [redacted]w[redacted]d presenting to MAU for contractions. Some clear mucus like discharge. No bleeding, no gush of fluid, good fetal movement. Prenatal care at [facility]. Next appointment on 05/22/11 at 8:45am.  OB History    Grav Para Term Preterm Abortions TAB SAB Ect Mult Living   1 0 0 0 0 0 0 0 0 0       Past Medical History  Diagnosis Date  . No pertinent past medical history     History reviewed. No pertinent past surgical history.  Family History  Problem Relation Age of Onset  . Asthma Mother   . Hypertension Mother   . Diabetes Maternal Grandmother   . Hypertension Maternal Grandmother   . Stroke Maternal Grandmother   . Anesthesia problems Neg Hx   . Malignant hyperthermia Neg Hx   . Pseudochol deficiency Neg Hx   . Hypotension Neg Hx     History  Substance Use Topics  . Smoking status: Never Smoker   . Smokeless tobacco: Never Used  . Alcohol Use: No    Allergies:  Allergies  Allergen Reactions  . Amoxicillin Nausea And Vomiting and Other (See Comments)    Tics & twitches    Prescriptions prior to admission  Medication Sig Dispense Refill  . butalbital-acetaminophen-caffeine (FIORICET, ESGIC) 50-325-40 MG per tablet Take 1-2 tablets by mouth every 6 (six) hours as needed. For headache      . cyclobenzaprine (FLEXERIL) 10 MG tablet Take 10 mg by mouth every 8 (eight) hours as needed. For muscle spasms      . hydrOXYzine (VISTARIL) 25 MG capsule Take 1 capsule (25 mg total) by mouth 3 (three) times daily as needed for itching.  30 capsule  0  . pantoprazole (PROTONIX) 40 MG tablet Take 40 mg by mouth 2 (two) times daily.        Review of Systems  Constitutional: Negative for fever and chills.  Respiratory: Positive for shortness of breath.   Cardiovascular: Negative for chest pain.  Gastrointestinal: Positive for abdominal pain. Negative for vomiting.  Genitourinary:  Negative for dysuria.  Musculoskeletal: Positive for back pain.  Skin: Negative for rash.  Neurological: Negative for headaches.   Physical Exam   Blood pressure 146/79, pulse 116, temperature 99.3 F (37.4 C), temperature source Oral, resp. rate 18, height 5' (1.524 m), weight 219 lb (99.338 kg).  Physical Exam  Constitutional: She is oriented to person, place, and time. She appears well-developed and well-nourished. She appears distressed (Appears uncomfortable).  HENT:  Head: Normocephalic and atraumatic.  Neck: Normal range of motion.  Cardiovascular: Normal rate, regular rhythm and normal heart sounds.   Respiratory: Effort normal and breath sounds normal.  GI: Soft.       Gravid, Toco in place  Neurological: She is alert and oriented to person, place, and time.  Skin: Skin is warm and dry.    MAU Course  Procedures  MDM Dilation: 3.5 Effacement (%): 80 Cervical Position: Middle Station: -2 Presentation: Vertex Exam by:: Elpidio Eric, RN Felt bag of water with no fluid noted on exam.  Contractions every 3-4 minutes on monitor.  FHT reassuring. Baseline 140's, moderate variability  Patient will be observed for one hour and rechecked  Assessment and Plan  21 yo G1 at [redacted]w[redacted]d presenting for contractions. - No change in cervix with observation for one hour - Patient is  uncomfortable. Encouraged to breathe through her contractions - Given Percocet x1 after labor check. (patient has a ride home) - If she has a gush of fluid, bleeding, decreased fetal movement or if her contractions change, she will return to MAU for evaluation - She should keep her appointment on Wed 05/22/11 at [facility].  - Plan discussed with Zerita Boers, CNM  Maribella Kuna 05/20/2011, 4:42 AM

## 2011-05-20 NOTE — Progress Notes (Signed)
Dr. Mikel Cella at bedside.  Assessment done and poc discussed with pt.

## 2011-05-20 NOTE — Anesthesia Preprocedure Evaluation (Signed)
Anesthesia Evaluation  Patient identified by MRN, date of birth, ID band Patient awake    Reviewed: Allergy & Precautions, H&P , NPO status , Patient's Chart, lab work & pertinent test results, reviewed documented beta blocker date and time   History of Anesthesia Complications Negative for: history of anesthetic complications  Airway Mallampati: III TM Distance: >3 FB Neck ROM: full    Dental  (+) Teeth Intact   Pulmonary neg pulmonary ROS,  breath sounds clear to auscultation        Cardiovascular negative cardio ROS  Rhythm:regular Rate:Normal     Neuro/Psych negative neurological ROS  negative psych ROS   GI/Hepatic Neg liver ROS, hiatal hernia, Medicated,  Endo/Other  Morbid obesity  Renal/GU negative Renal ROS     Musculoskeletal   Abdominal   Peds  Hematology negative hematology ROS (+)   Anesthesia Other Findings   Reproductive/Obstetrics (+) Pregnancy                           Anesthesia Physical Anesthesia Plan  ASA: II  Anesthesia Plan: Epidural   Post-op Pain Management:    Induction:   Airway Management Planned:   Additional Equipment:   Intra-op Plan:   Post-operative Plan:   Informed Consent: I have reviewed the patients History and Physical, chart, labs and discussed the procedure including the risks, benefits and alternatives for the proposed anesthesia with the patient or authorized representative who has indicated his/her understanding and acceptance.     Plan Discussed with:   Anesthesia Plan Comments:         Anesthesia Quick Evaluation

## 2011-05-20 NOTE — Progress Notes (Signed)
Ivonne Andrew, CNM made aware of maternal Heart rate. Pt continues to ramble and talk "out of her head" not making sense at time. Pt noted to be very lethargic but refusing to sleep. Updated on bleeding also.  Ordered to continue Lr @ 125, perform orthostatics, and continue to leave pulse ox on.

## 2011-05-20 NOTE — Discharge Instructions (Signed)
Support your belly as much as you can for some pain relief. When on your side, always have a pillow under your belly. Drink lots of water, take warm showers/baths, and you can take some Tylenol as needed for pain. If you have any changes, please come back to the MAU to be evaluated. Otherwise, please keep your appointment on Wednesday.

## 2011-05-20 NOTE — Anesthesia Procedure Notes (Signed)
Epidural Patient location during procedure: OB Start time: 05/20/2011 5:47 PM Reason for block: procedure for pain  Staffing Performed by: anesthesiologist   Preanesthetic Checklist Completed: patient identified, site marked, surgical consent, pre-op evaluation, timeout performed, IV checked, risks and benefits discussed and monitors and equipment checked  Epidural Patient position: sitting Prep: site prepped and draped and DuraPrep Patient monitoring: continuous pulse ox and blood pressure Approach: midline Injection technique: LOR air  Needle:  Needle type: Tuohy  Needle gauge: 17 G Needle length: 9 cm Needle insertion depth: 8 cm Catheter type: closed end flexible Catheter size: 19 Gauge Catheter at skin depth: 13 cm Test dose: negative  Assessment Events: blood not aspirated, injection not painful, no injection resistance, negative IV test and no paresthesia  Additional Notes Discussed risk of headache, infection, bleeding, nerve injury and failed or incomplete block.  Patient voices understanding and wishes to proceed.  Placed on second attempt due to patient movement and positioning.  First attempt had LOR and easily flushed, but unable to pass catheter.  Second attempt and same level successful.

## 2011-05-20 NOTE — Progress Notes (Signed)
Pt brought back to room six from car via wheelchair.  efm and toco placed as pt in discomfort.

## 2011-05-20 NOTE — ED Notes (Signed)
Rodman Pickle MD at bedside to assess patient after walking x1hr.

## 2011-05-20 NOTE — Progress Notes (Signed)
Pt to ambulate for one hour per Dr. Mikel Cella.  Monitors dc'd.

## 2011-05-21 DIAGNOSIS — O139 Gestational [pregnancy-induced] hypertension without significant proteinuria, unspecified trimester: Secondary | ICD-10-CM

## 2011-05-21 LAB — TSH: TSH: 1.014 u[IU]/mL (ref 0.350–4.500)

## 2011-05-21 LAB — CBC
HCT: 21.8 % — ABNORMAL LOW (ref 36.0–46.0)
Hemoglobin: 7 g/dL — ABNORMAL LOW (ref 12.0–15.0)
MCH: 23.6 pg — ABNORMAL LOW (ref 26.0–34.0)
MCHC: 32.1 g/dL (ref 30.0–36.0)
MCV: 73.6 fL — ABNORMAL LOW (ref 78.0–100.0)
Platelets: 284 10*3/uL (ref 150–400)
RBC: 2.96 MIL/uL — ABNORMAL LOW (ref 3.87–5.11)
RDW: 16.1 % — ABNORMAL HIGH (ref 11.5–15.5)
WBC: 26.9 10*3/uL — ABNORMAL HIGH (ref 4.0–10.5)

## 2011-05-21 MED ORDER — ONDANSETRON HCL 4 MG PO TABS: 4.0000 mg | ORAL_TABLET | ORAL | Status: DC | PRN

## 2011-05-21 MED ORDER — TETANUS-DIPHTH-ACELL PERTUSSIS 5-2.5-18.5 LF-MCG/0.5 IM SUSP: 0.5000 mL | Freq: Once | INTRAMUSCULAR | Status: DC

## 2011-05-21 MED ORDER — IBUPROFEN 600 MG PO TABS
600.0000 mg | ORAL_TABLET | Freq: Four times a day (QID) | ORAL | Status: DC
  Administered 2011-05-22 (×3): 600 mg via ORAL

## 2011-05-21 MED ORDER — SENNOSIDES-DOCUSATE SODIUM 8.6-50 MG PO TABS
2.0000 | ORAL_TABLET | Freq: Every day | ORAL | Status: DC
  Administered 2011-05-21: 2 via ORAL

## 2011-05-21 MED ORDER — LANOLIN HYDROUS EX OINT: TOPICAL_OINTMENT | CUTANEOUS | Status: DC | PRN

## 2011-05-21 MED ORDER — OXYCODONE-ACETAMINOPHEN 5-325 MG PO TABS: 1.0000 | ORAL_TABLET | ORAL | Status: DC | PRN

## 2011-05-21 MED ORDER — BENZOCAINE-MENTHOL 20-0.5 % EX AERO: 1.0000 "application " | INHALATION_SPRAY | CUTANEOUS | Status: DC | PRN

## 2011-05-21 MED ORDER — DIBUCAINE 1 % RE OINT: 1.0000 "application " | TOPICAL_OINTMENT | RECTAL | Status: DC | PRN

## 2011-05-21 MED ORDER — DIPHENHYDRAMINE HCL 25 MG PO CAPS: 25.0000 mg | ORAL_CAPSULE | Freq: Four times a day (QID) | ORAL | Status: DC | PRN

## 2011-05-21 MED ORDER — ZOLPIDEM TARTRATE 5 MG PO TABS: 5.0000 mg | ORAL_TABLET | Freq: Every evening | ORAL | Status: DC | PRN

## 2011-05-21 MED ORDER — WITCH HAZEL-GLYCERIN EX PADS: 1.0000 "application " | MEDICATED_PAD | CUTANEOUS | Status: DC | PRN

## 2011-05-21 MED ORDER — SIMETHICONE 80 MG PO CHEW: 80.0000 mg | CHEWABLE_TABLET | ORAL | Status: DC | PRN

## 2011-05-21 MED ORDER — PRENATAL MULTIVITAMIN CH
1.0000 | ORAL_TABLET | Freq: Every day | ORAL | Status: DC
  Administered 2011-05-22: 1 via ORAL
  Filled 2011-05-21: qty 1

## 2011-05-21 MED ORDER — ONDANSETRON HCL 4 MG/2ML IJ SOLN: 4.0000 mg | INTRAMUSCULAR | Status: DC | PRN

## 2011-05-21 NOTE — Progress Notes (Signed)
Post Partum Day 0. RN called to notify CNM of tachycardia 130-150's at rest. Pt was tachycardic on arrival to hospital in 140's. Denies Hx of tachycardia, palpitations, heart problems.  Subjective: Dizziness upon standing Denies SOB, palpitations, chest pain  Objective: Patient Vitals for the past 24 hrs: 05/20/11 2129 109/58 mmHg - - 139  20  - - -  05/20/11 2126 87/40 mmHg - - 201  - - - -  05/20/11 2125 103/30 mmHg - - 173  - 100 % - -  05/20/11 2122 114/86 mmHg - - 175  - - - -  05/20/11 2120 122/74 mmHg - - 141  - 100 % - -  05/20/11 2116 118/73 mmHg - - 147  20  - - -  05/20/11 2115 - - - 137  - 100 % - -  05/20/11 2110 - - - 135  - 100 % - -  05/20/11 2105 - - - 141  - 100 % - -  05/20/11 2101 127/97 mmHg - - 159  20  - - -  05/20/11 2100 - - - 139  - 100 % - -  05/20/11 2055 - - - 144  - 100 % - -  05/20/11 2050 - - - 140  - 100 % - -  05/20/11 2046 142/82 mmHg - - 153  20  - - -  05/20/11 2045 - - - 153  - 100 % - -  05/20/11 2040 - - - 158  - 98 % - -  05/20/11 2031 148/55 mmHg - - 141  20  - - -  05/20/11 2029 - - - 144  - 100 % - -  05/20/11 2017 127/81 mmHg 98.3 F (36.8 C) Oral 159  20  - - -    Physical Exam:  General: alert, cooperative, no distress and occassional inappropriate answers to questions. Family states this is pt's baseline.  Lochia: appropriate Uterine Fundus: firm Incision: NA   Basename 05/20/11 2200 05/20/11 1709  HGB 7.9* 9.9*  HCT 24.6* 31.2*    Assessment/Plan: Tachycardia Anemia w/ orthostatic changes  Dr. Marice Potter notified, agrees w/ POC w/ addition of TSH level.  Will CTO closely, continue IV fluids, CBC in am, continuous pulseOx Pt instructed to notify RN of any SOB, CP.   Dorathy Kinsman 05/20/2011, 9:50 PM

## 2011-05-21 NOTE — Progress Notes (Signed)
UR Chart review completed.  

## 2011-05-21 NOTE — Anesthesia Postprocedure Evaluation (Signed)
  Anesthesia Post-op Note  Patient: Emma Chambers  Procedure(s) Performed: * No surgery found *  Patient Location: Mother/Baby  Anesthesia Type: Epidural  Level of Consciousness: awake  Airway and Oxygen Therapy: Patient Spontanous Breathing  Post-op Pain: none  Post-op Assessment: Patient's Cardiovascular Status Stable, Respiratory Function Stable, Patent Airway, No signs of Nausea or vomiting, Adequate PO intake, Pain level controlled, No headache, No backache, No residual numbness and No residual motor weakness  Post-op Vital Signs: Reviewed and stable  Complications: No apparent anesthesia complications

## 2011-05-21 NOTE — Progress Notes (Signed)
Post Partum Day 1 Subjective: no complaints and voiding Less anxious/rambling than yesterday. Has not eaten yet today. Not ambulatory yet.  Objective: Blood pressure 95/66, pulse 115, temperature 99 F (37.2 C), temperature source Oral, resp. rate 20, height 5' (1.524 m), weight 99.338 kg (219 lb), SpO2 99.00%, unknown if currently breastfeeding.  Physical Exam:  General: alert, cooperative and no distress Lochia: appropriate Uterine Fundus: firm Incision: n/a DVT Evaluation: No evidence of DVT seen on physical exam. Negative Homan's sign.   Basename 05/21/11 0533 05/20/11 2200  HGB 7.0* 7.9*  HCT 21.8* 24.6*    Assessment/Plan: Contraception IUD, Bottlefeeding Tachycardia/Anxiety: possibly secondary to medications (Vistaril). No fevers, although elevated temps, which are improving. Vitals and mentation improving today Has been chronically tachycardic in clinic (100-120s) + Orthostatics last night Continue IVF H/H down to 7 this AM, continue to monitor clinically TSH pending Repeat orthostatics   LOS: 1 day   Ala Dach 05/21/2011, 8:45 AM

## 2011-05-21 NOTE — ED Provider Notes (Signed)
Medical Screening exam and patient care preformed by advanced practice provider.  Agree with the above management.  

## 2011-05-22 ENCOUNTER — Encounter: Payer: Medicaid Other | Admitting: Family Medicine

## 2011-05-22 NOTE — H&P (Signed)
Agree with above note.  Nemiah Kissner 05/22/2011 7:50 AM   

## 2011-05-22 NOTE — Discharge Summary (Signed)
Obstetric Discharge Summary Reason for Admission: onset of labor Prenatal Procedures: none Intrapartum Procedures: spontaneous vaginal delivery Postpartum Procedures: none Complications-Operative and Postpartum: 1st degree perineal laceration Hemoglobin  Date Value Range Status  05/21/2011 7.0* 12.0-15.0 (g/dL) Final     HCT  Date Value Range Status  05/21/2011 21.8* 36.0-46.0 (%) Final    Discharge Diagnoses: Term Pregnancy-delivered  Discharge Information: Date: 05/22/2011 Activity: unrestricted Diet: routine Medications: None Condition: stable and improved Instructions: refer to practice specific booklet Discharge to: home Follow-up Information    Follow up with [facility] in 6 weeks. (Appointment already made per patient)    Contact information:   [address] [place] [address]          Newborn Data: Live born female  Birth Weight: 8 lb 1.6 oz (3675 g) APGAR: 8, 9  Home with mother.  Ala Dach 05/22/2011, 7:20 AM

## 2011-05-22 NOTE — Progress Notes (Signed)
Post Partum Day 2 Subjective: no complaints, up ad lib, voiding, tolerating PO and + flatus. Patient feels that she is back to normal.  Objective: Blood pressure 121/72, pulse 92, temperature 98.2 F (36.8 C), temperature source Oral, resp. rate 18, height 5' (1.524 m), weight 99.338 kg (219 lb), SpO2 100.00%, unknown if currently breastfeeding.  Physical Exam:  General: alert, cooperative and no distress Lochia: appropriate Uterine Fundus: firm Incision: n/a DVT Evaluation: No evidence of DVT seen on physical exam. Negative Homan's sign.   Basename 05/21/11 0533 05/20/11 2200  HGB 7.0* 7.9*  HCT 21.8* 24.6*    Assessment/Plan: Discharge home Tachycardia/Anxiety: much improved over the past two days.  Currently <100. Afebrile. No s/sx of infection. F/u with [facility]   LOS: 2 days   Ala Dach 05/22/2011, 7:15 AM

## 2011-05-22 NOTE — Discharge Instructions (Signed)
Vaginal Delivery Care After  Change your pad on each trip to the bathroom.   Wipe gently with toilet paper during your hospital stay. Always wipe from front to back. A spray bottle with warm tap water could also be used or a towelette if available.   Place your soiled pad and toilet paper in a bathroom wastebasket with a plastic bag liner.   During your hospital stay, save any clots. If you pass a clot while on the toilet, do not flush it. Also, if your vaginal flow seems excessive to you, notify nursing personnel.   The first time you get out of bed after delivery, wait for assistance from a nurse. Do not get up alone at any time if you feel weak or dizzy.   Bend and extend your ankles forcefully so that you feel the calves of your legs get hard. Do this 6 times every hour when you are in bed and awake.   Do not sit with one foot under you, dangle your legs over the edge of the bed, or maintain a position that hinders the circulation in your legs.   Many women experience after pains for 2 to 3 days after delivery. These after pains are mild uterine contractions. Ask the nurse for a pain medication if you need something for this. Sometimes breastfeeding stimulates after pains; if you find this to be true, ask for the medication  -  hour before the next feeding.   For you and your infant's protection, do not go beyond the door(s) of the obstetric unit. Do not carry your baby in your arms in the hallway. When taking your baby to and from your room, put your baby in the bassinet and push the bassinet.   Mothers may have their babies in their room as much as they desire.  Document Released: 03/01/2000 Document Revised: 02/21/2011 Document Reviewed: 01/30/2007 [employer] Patient Information 2012 [employer]. 

## 2011-05-23 ENCOUNTER — Encounter (HOSPITAL_COMMUNITY): Payer: Self-pay

## 2011-05-23 ENCOUNTER — Inpatient Hospital Stay (HOSPITAL_COMMUNITY)
Admission: AD | Admit: 2011-05-23 | Discharge: 2011-05-23 | Disposition: A | Discharge status: Home / Self Care | Payer: Medicaid Other | Source: Ambulatory Visit | Attending: Obstetrics & Gynecology | Admitting: Obstetrics & Gynecology

## 2011-05-23 ENCOUNTER — Telehealth: Payer: Self-pay | Admitting: Obstetrics and Gynecology

## 2011-05-23 DIAGNOSIS — O9081 Anemia of the puerperium: Secondary | ICD-10-CM | POA: Insufficient documentation

## 2011-05-23 DIAGNOSIS — D649 Anemia, unspecified: Secondary | ICD-10-CM

## 2011-05-23 DIAGNOSIS — M549 Dorsalgia, unspecified: Secondary | ICD-10-CM | POA: Insufficient documentation

## 2011-05-23 LAB — CBC
HCT: 26 % — ABNORMAL LOW (ref 36.0–46.0)
Hemoglobin: 8.2 g/dL — ABNORMAL LOW (ref 12.0–15.0)
MCH: 23.6 pg — ABNORMAL LOW (ref 26.0–34.0)
MCHC: 31.5 g/dL (ref 30.0–36.0)
MCV: 74.9 fL — ABNORMAL LOW (ref 78.0–100.0)
Platelets: 435 10*3/uL — ABNORMAL HIGH (ref 150–400)
RBC: 3.47 MIL/uL — ABNORMAL LOW (ref 3.87–5.11)
RDW: 16.4 % — ABNORMAL HIGH (ref 11.5–15.5)
WBC: 14.9 10*3/uL — ABNORMAL HIGH (ref 4.0–10.5)

## 2011-05-23 NOTE — Progress Notes (Signed)
Pt here post vaginal delivery on 05/20/2011, told to take her muscle relaxer, sleeping pills, headache meds, came back and delivered. Does not remember any of it. States she has light spots all over her body, feels like her heart will tighten and relax. Denies hx of anxiety. States her family is worried about her, and she wants to be able to take care of her daughter safely. RN discussed need for pt to eat healthy meals with protein, and drink plenty of water.

## 2011-05-23 NOTE — Telephone Encounter (Signed)
Front desk call- transferred to nurse. Mother on line speaking for patient that was with her-- stated pt c/o chest pain, tingling sensation all over body, shaky, and that pt seeing "white veins" appearing on legs and then disappearing. Advised mother to take pt to MAU to get evaluated especially that she c/o chest pain (no c/o SOB). Pt and mother agrees.

## 2011-05-23 NOTE — ED Provider Notes (Signed)
History     CSN: [id number]  Arrival date & time 05/23/11  1623  G1P1001 PPD#3 NSVD    Chief Complaint  Patient presents with  . Back Pain  (Consider location/radiation/quality/duration/timing/severity/associated sxs/prior treatment) HPI She describes pain and tingling sensations all over but especially at site where she had her epidural and mid upper back. She states that she feels burning below her skin throughout her body and has difficulty walking. Denies numbness specifically. She is dizzy when standing up and passed a large blood clot last night but is not bleeding much now. Also having headaches since delivery. Suture site sore but improving. Bottlefeeding and breasts not engorged or painful.   Hospital course was significant for tachycardia in the 130s up to 200 for several hours on postpartum day 1. She had a TSH that was normal. Also of note she dropped her hemoglobin from baseline of 10 down to 7.0 on postpartum day #1.    Past Medical History  Diagnosis Date  . No pertinent past medical history     Past Surgical History  Procedure Date  . No past surgeries     Family History  Problem Relation Age of Onset  . Asthma Mother   . Hypertension Mother   . Diabetes Maternal Grandmother   . Hypertension Maternal Grandmother   . Stroke Maternal Grandmother   . Anesthesia problems Neg Hx   . Malignant hyperthermia Neg Hx   . Pseudochol deficiency Neg Hx   . Hypotension Neg Hx     History  Substance Use Topics  . Smoking status: Never Smoker   . Smokeless tobacco: Never Used  . Alcohol Use: No    OB History    Grav Para Term Preterm Abortions TAB SAB Ect Mult Living   1 1 1  0 0 0 0 0 0 1      Review of Systems  Allergies  Amoxicillin  Home Medications  No current outpatient prescriptions on file.  BP 137/76  Pulse 109  Temp(Src) 99.1 F (37.3 C) (Oral)  Resp 20  Ht 5' 0.5" (1.537 m)  Wt 93.532 kg (206 lb 3.2 oz)  BMI 39.61 kg/m2  SpO2 100%   Breastfeeding? Unknown  Physical Exam  Constitutional: She is oriented to person, place, and time. She appears well-developed and well-nourished. No distress.  HENT:  Head: Normocephalic.  Eyes: Pupils are equal, round, and reactive to light.  Neck: Normal range of motion. Neck supple. No thyromegaly present.  Cardiovascular: Normal rate.   Pulmonary/Chest: Effort normal.  Abdominal: She exhibits no distension. There is no tenderness.  Musculoskeletal: Normal range of motion.  Neurological: She is alert and oriented to person, place, and time. She has normal reflexes. She displays normal reflexes. No cranial nerve deficit. Coordination normal.  Skin: Skin is warm and dry.  Psychiatric: She has a normal mood and affect.       Anxiuos    ED Course  Procedures (including critical care time)   Labs Reviewed  CBC   No results found. Results for orders placed during the hospital encounter of 05/23/11 (from the past 24 hour(s))  CBC     Status: Abnormal   Collection Time   05/23/11  6:23 PM      Component Value Range   WBC 14.9 (*) 4.0 - 10.5 (K/uL)   RBC 3.47 (*) 3.87 - 5.11 (MIL/uL)   Hemoglobin 8.2 (*) 12.0 - 15.0 (g/dL)   HCT 11.9 (*) 14.7 - 46.0 (%)   MCV  74.9 (*) 78.0 - 100.0 (fL)   MCH 23.6 (*) 26.0 - 34.0 (pg)   MCHC 31.5  30.0 - 36.0 (g/dL)   RDW 16.1 (*) 09.6 - 15.5 (%)   Platelets 435 (*) 150 - 400 (K/uL)    No diagnosis found.   A: 3d PP NSVD  Anemia  P:Dr. Debroah Loop examined and reassured pt. Start taking iron tablet and continue PN vitamins F/U WOC 06/26/11 or sooner if more concerns

## 2011-05-23 NOTE — Progress Notes (Signed)
Patient states she had a SVD on 3-4. States she is having a burning pain in her mid back at the epidural level, having upper chest pain and bilateral leg pain.

## 2011-05-23 NOTE — Discharge Instructions (Signed)

## 2011-05-26 NOTE — ED Provider Notes (Signed)
Attestation of Attending Supervision of Advanced Practitioner: Evaluation and management procedures were performed by the PA/NP/CNM/OB Fellow under my supervision/collaboration. Chart reviewed and agree with management and plan. Spotting considered from the cervix  Emma Chambers V 05/26/2011 2:42 PM

## 2011-06-03 NOTE — ED Provider Notes (Signed)
Saw patient and agree with note.

## 2011-06-26 ENCOUNTER — Ambulatory Visit: Payer: Medicaid Other | Admitting: Physician Assistant

## 2011-07-16 ENCOUNTER — Inpatient Hospital Stay (HOSPITAL_COMMUNITY)
Admission: AD | Admit: 2011-07-16 | Discharge: 2011-07-16 | Disposition: A | Discharge status: Home / Self Care | Payer: Medicaid Other | Source: Ambulatory Visit | Attending: Obstetrics & Gynecology | Admitting: Obstetrics & Gynecology

## 2011-07-16 ENCOUNTER — Encounter (HOSPITAL_COMMUNITY): Payer: Self-pay | Admitting: *Deleted

## 2011-07-16 DIAGNOSIS — N939 Abnormal uterine and vaginal bleeding, unspecified: Secondary | ICD-10-CM

## 2011-07-16 DIAGNOSIS — D649 Anemia, unspecified: Secondary | ICD-10-CM

## 2011-07-16 DIAGNOSIS — O2390 Unspecified genitourinary tract infection in pregnancy, unspecified trimester: Secondary | ICD-10-CM | POA: Insufficient documentation

## 2011-07-16 DIAGNOSIS — N390 Urinary tract infection, site not specified: Secondary | ICD-10-CM

## 2011-07-16 DIAGNOSIS — N898 Other specified noninflammatory disorders of vagina: Secondary | ICD-10-CM

## 2011-07-16 LAB — COMPREHENSIVE METABOLIC PANEL
ALT: 8 U/L (ref 0–35)
AST: 14 U/L (ref 0–37)
Albumin: 3.9 g/dL (ref 3.5–5.2)
Alkaline Phosphatase: 131 U/L — ABNORMAL HIGH (ref 39–117)
BUN: 8 mg/dL (ref 6–23)
CO2: 23 mEq/L (ref 19–32)
Calcium: 9.3 mg/dL (ref 8.4–10.5)
Chloride: 102 mEq/L (ref 96–112)
Creatinine, Ser: 0.53 mg/dL (ref 0.50–1.10)
GFR calc Af Amer: >90 mL/min (ref >90)
GFR calc non Af Amer: >90 mL/min (ref >90)
Glucose, Bld: 81 mg/dL (ref 70–99)
Potassium: 3.6 mEq/L (ref 3.5–5.1)
Sodium: 135 mEq/L (ref 135–145)
Total Bilirubin: 0.2 mg/dL — ABNORMAL LOW (ref 0.3–1.2)
Total Protein: 7.4 g/dL (ref 6.0–8.3)

## 2011-07-16 LAB — CBC
HCT: 28.4 % — ABNORMAL LOW (ref 36.0–46.0)
Hemoglobin: 8.4 g/dL — ABNORMAL LOW (ref 12.0–15.0)
MCH: 21 pg — ABNORMAL LOW (ref 26.0–34.0)
MCHC: 29.6 g/dL — ABNORMAL LOW (ref 30.0–36.0)
MCV: 71 fL — ABNORMAL LOW (ref 78.0–100.0)
Platelets: 356 10*3/uL (ref 150–400)
RBC: 4 MIL/uL (ref 3.87–5.11)
RDW: 16.7 % — ABNORMAL HIGH (ref 11.5–15.5)
WBC: 7 10*3/uL (ref 4.0–10.5)

## 2011-07-16 LAB — URINALYSIS, ROUTINE W REFLEX MICROSCOPIC
Bilirubin Urine: NEGATIVE
Glucose, UA: 250 mg/dL — AB
Ketones, ur: 15 mg/dL — AB
Nitrite: POSITIVE — AB
Protein, ur: >300 mg/dL — AB
Specific Gravity, Urine: 1.015 (ref 1.005–1.030)
Urobilinogen, UA: >8 mg/dL — ABNORMAL HIGH (ref 0.0–1.0)
pH: 7 (ref 5.0–8.0)

## 2011-07-16 LAB — WET PREP, GENITAL
Clue Cells Wet Prep HPF POC: NONE SEEN
Trich, Wet Prep: NONE SEEN
Yeast Wet Prep HPF POC: NONE SEEN

## 2011-07-16 LAB — URINE MICROSCOPIC-ADD ON

## 2011-07-16 LAB — POCT PREGNANCY, URINE: Preg Test, Ur: NEGATIVE

## 2011-07-16 MED ORDER — NITROFURANTOIN MONOHYD MACRO 100 MG PO CAPS
100.0000 mg | ORAL_CAPSULE | Freq: Once | ORAL | Status: AC
  Administered 2011-07-16: 100 mg via ORAL
  Filled 2011-07-16: qty 1

## 2011-07-16 MED ORDER — FERROUS SULFATE 325 (65 FE) MG PO TABS: 325.0000 mg | ORAL_TABLET | Freq: Every day | ORAL | Status: DC

## 2011-07-16 MED ORDER — NITROFURANTOIN MONOHYD MACRO 100 MG PO CAPS: 100.0000 mg | ORAL_CAPSULE | Freq: Once | ORAL | Status: AC

## 2011-07-16 NOTE — MAU Provider Note (Signed)
History     CSN: [id number]  Arrival date and time: 07/16/11 1335   First Provider Initiated Contact with Patient 07/16/11 1525      Chief Complaint  Patient presents with  . Vaginal Bleeding   HPI  Pt is not pregnant with hx of SVD of viable female on 05/20/2011.  Pt states that she has been bleeding since delivery. She states the bleeding has increased. She is not having pain at this time. She missed her 6 week post partum appointment and has scheduled appointment in the GYN clinic tomorrow. She is not using anything for birth control and had dyspareunia with attempted intercourse.  Past Medical History  Diagnosis Date  . No pertinent past medical history     Past Surgical History  Procedure Date  . No past surgeries     Family History  Problem Relation Age of Onset  . Asthma Mother   . Hypertension Mother   . Diabetes Maternal Grandmother   . Hypertension Maternal Grandmother   . Stroke Maternal Grandmother   . Anesthesia problems Neg Hx   . Malignant hyperthermia Neg Hx   . Pseudochol deficiency Neg Hx   . Hypotension Neg Hx     History  Substance Use Topics  . Smoking status: Never Smoker   . Smokeless tobacco: Never Used  . Alcohol Use: No    Allergies:  Allergies  Allergen Reactions  . Amoxicillin Nausea And Vomiting and Other (See Comments)    Tics & twitches    No prescriptions prior to admission    ROS Physical Exam   Blood pressure 120/52, pulse 94, temperature 97.9 F (36.6 C), temperature source Oral, resp. rate 16, height 5\' 1"  (1.549 m), weight 207 lb 9.6 oz (94.167 kg), SpO2 100.00%, not currently breastfeeding.  Physical Exam  Nursing note and vitals reviewed. Constitutional: She is oriented to person, place, and time. She appears well-developed and well-nourished.  HENT:  Head: Normocephalic.  Eyes: Pupils are equal, round, and reactive to light.  Neck: Normal range of motion. Neck supple.  Cardiovascular: Normal rate and normal  heart sounds.   Respiratory: Effort normal and breath sounds normal.  GI: Soft. She exhibits no distension. There is no tenderness. There is no rebound and no guarding.  Genitourinary:       Mod to large amount of bright red blood in vault. Cervix clean, closed. Uterus difficult to assess due to habitus.  Bimanual diffusely tender without rebound  Musculoskeletal: Normal range of motion.  Neurological: She is alert and oriented to person, place, and time.  Skin: Skin is warm and dry.  Psychiatric: She has a normal mood and affect.    MAU Course  Procedures  Results for orders placed during the hospital encounter of 07/16/11 (from the past 72 hour(s))  URINALYSIS, ROUTINE W REFLEX MICROSCOPIC     Status: Abnormal   Collection Time   07/16/11  1:00 PM      Component Value Range Comment   Color, Urine YELLOW  YELLOW     APPearance TURBID (*) CLEAR     Specific Gravity, Urine 1.015  1.005 - 1.030     pH 7.0  5.0 - 8.0     Glucose, UA 250 (*) NEGATIVE (mg/dL)    Hgb urine dipstick LARGE (*) NEGATIVE     Bilirubin Urine NEGATIVE  NEGATIVE     Ketones, ur 15 (*) NEGATIVE (mg/dL)    Protein, ur >147 (*) NEGATIVE (mg/dL)    Urobilinogen, UA >  8.0 (*) 0.0 - 1.0 (mg/dL)    Nitrite POSITIVE (*) NEGATIVE     Leukocytes, UA LARGE (*) NEGATIVE    URINE MICROSCOPIC-ADD ON     Status: Abnormal   Collection Time   07/16/11  1:00 PM      Component Value Range Comment   Squamous Epithelial / LPF FEW (*) RARE     WBC, UA 7-10  <3 (WBC/hpf)    RBC / HPF TOO NUMEROUS TO COUNT  <3 (RBC/hpf)    Bacteria, UA FEW (*) RARE    POCT PREGNANCY, URINE     Status: Normal   Collection Time   07/16/11  2:38 PM      Component Value Range Comment   Preg Test, Ur NEGATIVE  NEGATIVE    CBC     Status: Abnormal   Collection Time   07/16/11  3:30 PM      Component Value Range Comment   WBC 7.0  4.0 - 10.5 (K/uL)    RBC 4.00  3.87 - 5.11 (MIL/uL)    Hemoglobin 8.4 (*) 12.0 - 15.0 (g/dL)    HCT 96.0 (*) 45.4 -  46.0 (%)    MCV 71.0 (*) 78.0 - 100.0 (fL)    MCH 21.0 (*) 26.0 - 34.0 (pg)    MCHC 29.6 (*) 30.0 - 36.0 (g/dL)    RDW 09.8 (*) 11.9 - 15.5 (%)    Platelets 356  150 - 400 (K/uL)   COMPREHENSIVE METABOLIC PANEL     Status: Abnormal   Collection Time   07/16/11  3:30 PM      Component Value Range Comment   Sodium 135  135 - 145 (mEq/L)    Potassium 3.6  3.5 - 5.1 (mEq/L)    Chloride 102  96 - 112 (mEq/L)    CO2 23  19 - 32 (mEq/L)    Glucose, Bld 81  70 - 99 (mg/dL)    BUN 8  6 - 23 (mg/dL)    Creatinine, Ser 1.47  0.50 - 1.10 (mg/dL)    Calcium 9.3  8.4 - 10.5 (mg/dL)    Total Protein 7.4  6.0 - 8.3 (g/dL)    Albumin 3.9  3.5 - 5.2 (g/dL)    AST 14  0 - 37 (U/L)    ALT 8  0 - 35 (U/L)    Alkaline Phosphatase 131 (*) 39 - 117 (U/L)    Total Bilirubin 0.2 (*) 0.3 - 1.2 (mg/dL)    GFR calc non Af Amer >90  >90 (mL/min)    GFR calc Af Amer >90  >90 (mL/min)   WET PREP, GENITAL     Status: Abnormal   Collection Time   07/16/11  3:56 PM      Component Value Range Comment   Yeast Wet Prep HPF POC NONE SEEN  NONE SEEN     Trich, Wet Prep NONE SEEN  NONE SEEN     Clue Cells Wet Prep HPF POC NONE SEEN  NONE SEEN     WBC, Wet Prep HPF POC FEW (*) NONE SEEN  FEW BACTERIA SEEN  GC/CHLAMYDIA PROBE AMP, GENITAL     Status: Normal   Collection Time   07/16/11  3:56 PM      Component Value Range Comment   GC Probe Amp, Genital NEGATIVE  NEGATIVE     Chlamydia, DNA Probe NEGATIVE  NEGATIVE    URINE CULTURE     Status: Normal   Collection Time  07/16/11  3:56 PM      Component Value Range Comment   Specimen Description URINE, CLEAN CATCH      Special Requests NONE      Culture  Setup Time [id number]      Colony Count 30,000 COLONIES/ML      Culture        Value: Multiple bacterial morphotypes present, none predominant. Suggest appropriate recollection if clinically indicated.   Report Status 07/18/2011 FINAL      Pt was unable to wait for ultrasound- has appointment in GYN clinic  tomorrow and will f/u there Assessment and Plan    Ladislav Caselli 07/16/2011, 4:46 PM

## 2011-07-16 NOTE — MAU Note (Signed)
Patient states she had a SVD on 3-4. States she has been having upper abdominal pain off and on since and having some spotting every day since that time. Not having any pain at this time. Did not keep her postpartum visit appointment. Has had unprotected sex and thinks she might be pregnant.

## 2011-07-16 NOTE — MAU Note (Signed)
Patient states she ate "toilet tissue" while pregnant and is still eating it.

## 2011-07-17 ENCOUNTER — Encounter: Payer: Self-pay | Admitting: Physician Assistant

## 2011-07-17 ENCOUNTER — Ambulatory Visit (INDEPENDENT_AMBULATORY_CARE_PROVIDER_SITE_OTHER): Payer: Medicaid Other | Admitting: Physician Assistant

## 2011-07-17 LAB — GC/CHLAMYDIA PROBE AMP, GENITAL
Chlamydia, DNA Probe: NEGATIVE
GC Probe Amp, Genital: NEGATIVE

## 2011-07-17 NOTE — Progress Notes (Signed)
Pelvic/Trans vag [place] scheduled 07/22/11 @ 0830

## 2011-07-17 NOTE — Progress Notes (Signed)
Patient reports heavy vaginal bleeding since delivery, reports using both a tampon and a pad. Patient also c/o migraine and back pain. Patient reports visiting ER for back pain and bleeding but left after waiting 6 hours.

## 2011-07-17 NOTE — Patient Instructions (Signed)
Intrauterine Device Information An intrauterine device (IUD) is inserted into your uterus and prevents pregnancy. There are 2 types of IUDs available:  Copper IUD. This type of IUD is wrapped in copper wire and is placed inside the uterus. Copper makes the uterus and fallopian tubes produce a fluid that kills sperm. The copper IUD can stay in place for 10 years.   Hormone IUD. This type of IUD contains the hormone progestin (synthetic progesterone). The hormone thickens the cervical mucus and prevents sperm from entering the uterus, and it also thins the uterine lining to prevent implantation of a fertilized egg. The hormone can weaken or kill the sperm that get into the uterus. The hormone IUD can stay in place for 5 years.  Your caregiver will make sure you are a good candidate for a contraceptive IUD. Discuss with your caregiver the possible side effects. ADVANTAGES  It is highly effective, reversible, long-acting, and low maintenance.   There are no estrogen-related side effects.   An IUD can be used when breastfeeding.   It is not associated with weight gain.   It works immediately after insertion.   The copper IUD does not interfere with your female hormones.   The progesterone IUD can make heavy menstrual periods lighter.   The progesterone IUD can be used for 5 years.   The copper IUD can be used for 10 years.  DISADVANTAGES  The progesterone IUD can be associated with irregular bleeding patterns.   The copper IUD can make your menstrual flow heavier and more painful.   You may experience cramping and vaginal bleeding after insertion.  Document Released: 02/06/2004 Document Revised: 02/21/2011 Document Reviewed: 07/07/2010 Exit[employer] Patient Information 2012 [employer]. 

## 2011-07-17 NOTE — Progress Notes (Signed)
  Subjective:     Emma Chambers is a 21 y.o. female who presents for a postpartum visit. She is 8 weeks postpartum following a spontaneous vaginal delivery. I have fully reviewed the prenatal and intrapartum course. The delivery was at term gestational weeks. Outcome: spontaneous vaginal delivery.  Postpartum course has been complicated by continued heavy vaginal bleeding and passing golf-ball sized clots dail. Baby's course has been natal teeth. Baby is feeding by bottle - . Bleeding red and clots. Bowel function is normal. Bladder function is normal. Patient is sexually active. Contraception method is none and desires Mirena. Postpartum depression screening: negative. Pt was evaluated in MAU for heavy bleeding. [place] was not performed. Was instructed to FU as scheduled today for visit.  The following portions of the patient's history were reviewed and updated as appropriate: allergies, current medications, past family history, past medical history, past social history, past surgical history and problem list.  Review of Systems Pertinent items are noted in HPI.   Objective:    BP 104/60  Pulse 80  Temp 98.2 F (36.8 C)  Ht 5' (1.524 m)  Wt 205 lb (92.987 kg)  BMI 40.04 kg/m2  Breastfeeding? No  General:  alert, cooperative and no distress   Breasts:  inspection negative, no nipple discharge or bleeding, no masses or nodularity palpable  Abdomen: soft, no masses, difuse mild tenderness through out        Active 8 week female infant, NAD 2 lower central incisors present with 50% decay, non tender to palpation, immobile   Assessment:    8 postpartum exam. Pap smear not done at today's visit.  Excessive postpartum bleeding and pain Neonate with decay of natal teeth  Plan:    1. Contraception: Plans IUD 2. Pelvic US to r/o retained POC 3. Follow up in: 1-2 weeks for [place] results and probable IUD placement 4. Referral to Dr. Berton Bon, pediatric dentistry for infant. Pt strongly  encouraged to wipe teeth and mouth with wet soft cloth after every feeding and upon rising in am.

## 2011-07-18 LAB — URINE CULTURE
Colony Count: 30000
Culture  Setup Time: 201304302220

## 2011-07-22 ENCOUNTER — Ambulatory Visit (HOSPITAL_COMMUNITY)
Admission: RE | Admit: 2011-07-22 | Discharge: 2011-07-22 | Disposition: A | Discharge status: Home / Self Care | Payer: Medicaid Other | Source: Ambulatory Visit | Attending: Physician Assistant | Admitting: Physician Assistant

## 2011-08-02 ENCOUNTER — Ambulatory Visit (INDEPENDENT_AMBULATORY_CARE_PROVIDER_SITE_OTHER): Payer: Medicaid Other | Admitting: Advanced Practice Midwife

## 2011-08-02 ENCOUNTER — Encounter: Payer: Self-pay | Admitting: Advanced Practice Midwife

## 2011-08-02 VITALS — BP 133/79 | HR 98 | Temp 98.3°F | Ht 63.0 in | Wt 210.0 lb

## 2011-08-02 DIAGNOSIS — Z01812 Encounter for preprocedural laboratory examination: Secondary | ICD-10-CM

## 2011-08-02 DIAGNOSIS — Z202 Contact with and (suspected) exposure to infections with a predominantly sexual mode of transmission: Secondary | ICD-10-CM

## 2011-08-02 DIAGNOSIS — Z9189 Other specified personal risk factors, not elsewhere classified: Secondary | ICD-10-CM

## 2011-08-02 DIAGNOSIS — Z3049 Encounter for surveillance of other contraceptives: Secondary | ICD-10-CM

## 2011-08-02 DIAGNOSIS — Z30430 Encounter for insertion of intrauterine contraceptive device: Secondary | ICD-10-CM

## 2011-08-02 LAB — POCT PREGNANCY, URINE: Preg Test, Ur: NEGATIVE

## 2011-08-02 MED ORDER — LEVONORGESTREL 20 MCG/24HR IU IUD
INTRAUTERINE_SYSTEM | Freq: Once | INTRAUTERINE | Status: AC
  Administered 2011-08-02: 1 via INTRAUTERINE

## 2011-08-02 NOTE — Progress Notes (Signed)
Addended by: Lynnell Dike on: 08/02/2011 11:07 AM   Modules accepted: Orders

## 2011-08-02 NOTE — Patient Instructions (Signed)
Intrauterine Device Insertion Care After Refer to this sheet in the next few weeks. These instructions provide you with information on caring for yourself after your procedure. Your caregiver may also give you more specific instructions. Your treatment has been planned according to current medical practices, but problems sometimes occur. Call your caregiver if you have any problems or questions after your procedure. HOME CARE INSTRUCTIONS   Only take over-the-counter or prescription medicines for pain, discomfort, or fever as directed by your caregiver. Do not use aspirin. This may increase bleeding.   Check your IUD to make sure it is in place before you resume sexual activity. You should be able to feel the strings. If you cannot feel the strings, something may be wrong. The IUD may have fallen out of the uterus, or the uterus may have been punctured (perforated) during placement. Also, if the strings are getting longer, it may mean that the IUD is being forced out of the uterus. You no longer have full protection from pregnancy if any of these problems occur.   You may resume sexual intercourse if you are not having problems with the IUD. The IUD is considered immediately effective.   You may resume normal activities.   Keep all follow-up appointments to be sure your IUD has remained in place. After the first exam, yearly exams are advised, unless you cannot feel the strings of your IUD.   Continue to check that the IUD is still in place by feeling for the strings after every menstrual period.  SEEK MEDICAL CARE IF:   You have bleeding that is heavier or lasts longer than a normal menstrual cycle.   You have a fever.   You have increasing cramps or abdominal pain not relieved with medicine.   You have abdominal pain that does not seem to be related to the same area of earlier cramping and pain.   You are lightheaded, unusually weak, or faint.   You have abnormal vaginal discharge or  smells.   You have pain during sexual intercourse.   You cannot feel the IUD strings, or the IUD string has gotten longer.   You feel the IUD at the opening of the cervix in the vagina.   You think you are pregnant, or you miss your menstrual period.   The IUD string is hurting your sex partner.  Document Released: 10/31/2010 Document Revised: 02/21/2011 Document Reviewed: 10/31/2010 [employer] Patient Information 2012 [employer]. 

## 2011-08-02 NOTE — Progress Notes (Addendum)
Here for Mirena Insertion. No IC since last visit 07/17/11. [place] reviewed, normal. No evidence of retained POC. Performed for vaginal bleeding 8 weeks after delivery, resolved. No abd pain or fever since delivery.  Patient identified, informed consent performed, signed copy in chart, time out was performed.  Urine pregnancy test negative. GC/CT neg 07/16/11.  Speculum placed in the vagina.  Cervix visualized.  Cleaned with Betadine x 2.  Uterus sounded to 8 cm.  Mirena IUD placed per manufacturer's recommendations.  Strings trimmed to 3 cm.   Patient given post procedure instructions and Mirena care card with expiration date.  Patient is asked to check IUD strings periodically and follow up in 4-6 weeks for IUD check.   Emma Chambers, Emma Chambers 08/02/2011 10:20 AM

## 2011-08-02 NOTE — Progress Notes (Signed)
Here for IUD placement , c/o outside of perineum aching/throbbing., denies discharge. , has had intercourse , wants std check

## 2011-08-03 LAB — GC/CHLAMYDIA PROBE AMP, GENITAL
Chlamydia, DNA Probe: NEGATIVE
GC Probe Amp, Genital: NEGATIVE

## 2011-09-06 ENCOUNTER — Ambulatory Visit: Payer: Medicaid Other | Admitting: Obstetrics & Gynecology

## 2011-11-26 ENCOUNTER — Inpatient Hospital Stay (HOSPITAL_COMMUNITY): Payer: Medicaid Other

## 2011-11-26 ENCOUNTER — Inpatient Hospital Stay (HOSPITAL_COMMUNITY)
Admission: AD | Admit: 2011-11-26 | Discharge: 2011-11-26 | Disposition: A | Discharge status: Home / Self Care | Payer: Medicaid Other | Source: Ambulatory Visit | Attending: Obstetrics & Gynecology | Admitting: Obstetrics & Gynecology

## 2011-11-26 ENCOUNTER — Encounter (HOSPITAL_COMMUNITY): Payer: Self-pay

## 2011-11-26 DIAGNOSIS — O26899 Other specified pregnancy related conditions, unspecified trimester: Secondary | ICD-10-CM

## 2011-11-26 DIAGNOSIS — O99891 Other specified diseases and conditions complicating pregnancy: Secondary | ICD-10-CM | POA: Insufficient documentation

## 2011-11-26 DIAGNOSIS — M549 Dorsalgia, unspecified: Secondary | ICD-10-CM | POA: Insufficient documentation

## 2011-11-26 DIAGNOSIS — R12 Heartburn: Secondary | ICD-10-CM | POA: Insufficient documentation

## 2011-11-26 LAB — URINE MICROSCOPIC-ADD ON

## 2011-11-26 LAB — URINALYSIS, ROUTINE W REFLEX MICROSCOPIC
Bilirubin Urine: NEGATIVE
Glucose, UA: NEGATIVE mg/dL
Hgb urine dipstick: NEGATIVE
Ketones, ur: NEGATIVE mg/dL
Nitrite: NEGATIVE
Protein, ur: NEGATIVE mg/dL
Specific Gravity, Urine: <1.005 — ABNORMAL LOW (ref 1.005–1.030)
Urobilinogen, UA: 0.2 mg/dL (ref 0.0–1.0)
pH: 6 (ref 5.0–8.0)

## 2011-11-26 LAB — CBC
HCT: 37.2 % (ref 36.0–46.0)
Hemoglobin: 11.6 g/dL — ABNORMAL LOW (ref 12.0–15.0)
MCH: 23.1 pg — ABNORMAL LOW (ref 26.0–34.0)
MCHC: 31.2 g/dL (ref 30.0–36.0)
MCV: 74.1 fL — ABNORMAL LOW (ref 78.0–100.0)
Platelets: 417 K/uL — ABNORMAL HIGH (ref 150–400)
RBC: 5.02 MIL/uL (ref 3.87–5.11)
RDW: 18.6 % — ABNORMAL HIGH (ref 11.5–15.5)
WBC: 12.2 K/uL — ABNORMAL HIGH (ref 4.0–10.5)

## 2011-11-26 LAB — ABO/RH: ABO/RH(D): A POS

## 2011-11-26 LAB — POCT PREGNANCY, URINE: Preg Test, Ur: POSITIVE — AB

## 2011-11-26 LAB — HCG, QUANTITATIVE, PREGNANCY: hCG, Beta Chain, Quant, S: 5834 m[IU]/mL — ABNORMAL HIGH

## 2011-11-26 MED ORDER — RANITIDINE HCL 150 MG PO TABS: 150.0000 mg | ORAL_TABLET | Freq: Two times a day (BID) | ORAL | Status: DC

## 2011-11-26 NOTE — MAU Note (Signed)
Patient is in with c/o intense heartburn without relief from zantac or tums, constant nausea, lower abdominal / back pain. She states that had a baby in march 2013,. She had an iud that fell out per patient in august.

## 2011-11-26 NOTE — MAU Provider Note (Signed)
History     CSN: [id number]  Arrival date and time: 11/26/11 1228   None     No chief complaint on file.  HPI 21 y.o. G2P1001 at [redacted]w[redacted]d with severe heartburn, no better with tums, low back pain.    Past Medical History  Diagnosis Date  . No pertinent past medical history   . UTI (lower urinary tract infection)   . Hypertension     Past Surgical History  Procedure Date  . No past surgeries     Family History  Problem Relation Age of Onset  . Asthma Mother   . Hypertension Mother   . Diabetes Maternal Grandmother   . Hypertension Maternal Grandmother   . Stroke Maternal Grandmother   . Anesthesia problems Neg Hx   . Malignant hyperthermia Neg Hx   . Pseudochol deficiency Neg Hx   . Hypotension Neg Hx     History  Substance Use Topics  . Smoking status: Never Smoker   . Smokeless tobacco: Never Used  . Alcohol Use: 2.4 oz/week    4 Cans of beer per week    Allergies:  Allergies  Allergen Reactions  . Amoxicillin Nausea And Vomiting and Other (See Comments)    Tics & twitches    Prescriptions prior to admission  Medication Sig Dispense Refill  . calcium carbonate (TUMS - DOSED IN MG ELEMENTAL CALCIUM) 500 MG chewable tablet Chew 1-4 tablets by mouth 4 (four) times daily as needed. For heartburn        Review of Systems  Constitutional: Negative.   Respiratory: Negative.   Cardiovascular: Negative.   Gastrointestinal: Positive for heartburn. Negative for nausea, vomiting, abdominal pain, diarrhea and constipation.  Genitourinary: Negative for dysuria, urgency, frequency, hematuria and flank pain.       Negative for vaginal bleeding, vaginal discharge, dyspareunia  Musculoskeletal: Positive for back pain.  Neurological: Negative.   Psychiatric/Behavioral: Negative.    Physical Exam   Last menstrual period 10/17/2011, not currently breastfeeding.  Physical Exam  Nursing note and vitals reviewed. Constitutional: She is oriented to person, place, and  time. She appears well-developed and well-nourished. No distress.  Cardiovascular: Normal rate.   Respiratory: Effort normal.  GI: Soft. There is no tenderness.  Genitourinary:       Pt declines pelvic exam   Musculoskeletal: Normal range of motion.  Neurological: She is alert and oriented to person, place, and time.  Skin: Skin is warm and dry.  Psychiatric: She has a normal mood and affect.    MAU Course  Procedures Results for orders placed during the hospital encounter of 11/26/11 (from the past 24 hour(s))  URINALYSIS, ROUTINE W REFLEX MICROSCOPIC     Status: Abnormal   Collection Time   11/26/11  1:00 PM      Component Value Range   Color, Urine YELLOW  YELLOW   APPearance CLEAR  CLEAR   Specific Gravity, Urine <1.005 (*) 1.005 - 1.030   pH 6.0  5.0 - 8.0   Glucose, UA NEGATIVE  NEGATIVE mg/dL   Hgb urine dipstick NEGATIVE  NEGATIVE   Bilirubin Urine NEGATIVE  NEGATIVE   Ketones, ur NEGATIVE  NEGATIVE mg/dL   Protein, ur NEGATIVE  NEGATIVE mg/dL   Urobilinogen, UA 0.2  0.0 - 1.0 mg/dL   Nitrite NEGATIVE  NEGATIVE   Leukocytes, UA LARGE (*) NEGATIVE  URINE MICROSCOPIC-ADD ON     Status: Abnormal   Collection Time   11/26/11  1:00 PM  Component Value Range   Squamous Epithelial / LPF FEW (*) RARE   WBC, UA 11-20  <3 WBC/hpf   RBC / HPF 0-2  <3 RBC/hpf   Bacteria, UA MANY (*) RARE   Urine-Other RARE YEAST    POCT PREGNANCY, URINE     Status: Abnormal   Collection Time   11/26/11  1:12 PM      Component Value Range   Preg Test, Ur POSITIVE (*) NEGATIVE  CBC     Status: Abnormal   Collection Time   11/26/11  1:50 PM      Component Value Range   WBC 12.2 (*) 4.0 - 10.5 K/uL   RBC 5.02  3.87 - 5.11 MIL/uL   Hemoglobin 11.6 (*) 12.0 - 15.0 g/dL   HCT 40.9  81.1 - 91.4 %   MCV 74.1 (*) 78.0 - 100.0 fL   MCH 23.1 (*) 26.0 - 34.0 pg   MCHC 31.2  30.0 - 36.0 g/dL   RDW 78.2 (*) 95.6 - 21.3 %   Platelets 417 (*) 150 - 400 K/uL  ABO/RH     Status: Normal    Collection Time   11/26/11  1:50 PM      Component Value Range   ABO/RH(D) A POS    HCG, QUANTITATIVE, PREGNANCY     Status: Abnormal   Collection Time   11/26/11  1:50 PM      Component Value Range   hCG, Beta Chain, Quant, S 5834 (*) <5 mIU/mL   U/S: 5.2 weeks size IUGS with yolk sac, no fetal pole visualized yet  Assessment and Plan  21 y.o. G2P1001 at [redacted]w[redacted]d  1. Back pain in pregnancy   2. Heartburn in pregnancy    Medication List  As of 11/26/2011  4:10 PM   START taking these medications         ranitidine 150 MG tablet   Commonly known as: ZANTAC   Take 1 tablet (150 mg total) by mouth 2 (two) times daily.         CONTINUE taking these medications         calcium carbonate 500 MG chewable tablet   Commonly known as: TUMS - dosed in mg elemental calcium          Where to get your medications    These are the prescriptions that you need to pick up. We sent them to a specific pharmacy, so you will need to go there to get them.   [id number] - [place], [place] - [address] AT [facility] [address]    [address] [place] [address]    Phone: [phone]        ranitidine 150 MG tablet           Follow-up Information    Follow up with your own doctor.        Pt states she is following up with her family doctor. Pregnancy verification given.   Emma Chambers 11/26/2011, 2:55 PM

## 2011-11-27 LAB — URINE CULTURE: Colony Count: 80000

## 2011-12-03 ENCOUNTER — Encounter (HOSPITAL_COMMUNITY): Payer: Self-pay | Admitting: Emergency Medicine

## 2011-12-03 ENCOUNTER — Emergency Department (HOSPITAL_COMMUNITY)
Admission: EM | Admit: 2011-12-03 | Discharge: 2011-12-03 | Disposition: A | Discharge status: Home / Self Care | Payer: Medicaid Other | Attending: Emergency Medicine | Admitting: Emergency Medicine

## 2011-12-03 DIAGNOSIS — O2390 Unspecified genitourinary tract infection in pregnancy, unspecified trimester: Secondary | ICD-10-CM | POA: Insufficient documentation

## 2011-12-03 DIAGNOSIS — R51 Headache: Secondary | ICD-10-CM | POA: Insufficient documentation

## 2011-12-03 DIAGNOSIS — Z880 Allergy status to penicillin: Secondary | ICD-10-CM | POA: Insufficient documentation

## 2011-12-03 DIAGNOSIS — R110 Nausea: Secondary | ICD-10-CM

## 2011-12-03 DIAGNOSIS — Z886 Allergy status to analgesic agent status: Secondary | ICD-10-CM | POA: Insufficient documentation

## 2011-12-03 DIAGNOSIS — O211 Hyperemesis gravidarum with metabolic disturbance: Secondary | ICD-10-CM | POA: Insufficient documentation

## 2011-12-03 DIAGNOSIS — E860 Dehydration: Secondary | ICD-10-CM | POA: Insufficient documentation

## 2011-12-03 DIAGNOSIS — O99891 Other specified diseases and conditions complicating pregnancy: Secondary | ICD-10-CM | POA: Insufficient documentation

## 2011-12-03 DIAGNOSIS — N390 Urinary tract infection, site not specified: Secondary | ICD-10-CM | POA: Insufficient documentation

## 2011-12-03 DIAGNOSIS — R519 Headache, unspecified: Secondary | ICD-10-CM

## 2011-12-03 LAB — BASIC METABOLIC PANEL
BUN: 7 mg/dL (ref 6–23)
CO2: 24 mEq/L (ref 19–32)
Calcium: 9.6 mg/dL (ref 8.4–10.5)
Chloride: 100 mEq/L (ref 96–112)
Creatinine, Ser: 0.68 mg/dL (ref 0.50–1.10)
GFR calc Af Amer: >90 mL/min (ref >90)
GFR calc non Af Amer: >90 mL/min (ref >90)
Glucose, Bld: 93 mg/dL (ref 70–99)
Potassium: 3.4 mEq/L — ABNORMAL LOW (ref 3.5–5.1)
Sodium: 134 mEq/L — ABNORMAL LOW (ref 135–145)

## 2011-12-03 LAB — CBC WITH DIFFERENTIAL/PLATELET
Basophils Absolute: 0 10*3/uL (ref 0.0–0.1)
Basophils Relative: 0 % (ref 0–1)
Eosinophils Absolute: 0.3 10*3/uL (ref 0.0–0.7)
Eosinophils Relative: 3 % (ref 0–5)
HCT: 35.4 % — ABNORMAL LOW (ref 36.0–46.0)
Hemoglobin: 11.2 g/dL — ABNORMAL LOW (ref 12.0–15.0)
Lymphocytes Relative: 25 % (ref 12–46)
Lymphs Abs: 2.5 10*3/uL (ref 0.7–4.0)
MCH: 23.3 pg — ABNORMAL LOW (ref 26.0–34.0)
MCHC: 31.6 g/dL (ref 30.0–36.0)
MCV: 73.6 fL — ABNORMAL LOW (ref 78.0–100.0)
Monocytes Absolute: 0.7 10*3/uL (ref 0.1–1.0)
Monocytes Relative: 7 % (ref 3–12)
Neutro Abs: 6.7 10*3/uL (ref 1.7–7.7)
Neutrophils Relative %: 66 % (ref 43–77)
Platelets: 369 10*3/uL (ref 150–400)
RBC: 4.81 MIL/uL (ref 3.87–5.11)
RDW: 18.2 % — ABNORMAL HIGH (ref 11.5–15.5)
WBC: 10.6 10*3/uL — ABNORMAL HIGH (ref 4.0–10.5)

## 2011-12-03 LAB — URINALYSIS, MICROSCOPIC ONLY
Bilirubin Urine: NEGATIVE
Glucose, UA: NEGATIVE mg/dL
Hgb urine dipstick: NEGATIVE
Ketones, ur: NEGATIVE mg/dL
Nitrite: NEGATIVE
Protein, ur: NEGATIVE mg/dL
Specific Gravity, Urine: 1.017 (ref 1.005–1.030)
Urobilinogen, UA: 0.2 mg/dL (ref 0.0–1.0)
pH: 8.5 — ABNORMAL HIGH (ref 5.0–8.0)

## 2011-12-03 LAB — POCT PREGNANCY, URINE: Preg Test, Ur: POSITIVE — AB

## 2011-12-03 MED ORDER — NITROFURANTOIN MONOHYD MACRO 100 MG PO CAPS
100.0000 mg | ORAL_CAPSULE | Freq: Once | ORAL | Status: AC
  Administered 2011-12-03: 100 mg via ORAL
  Filled 2011-12-03: qty 1

## 2011-12-03 MED ORDER — ONDANSETRON HCL 4 MG/2ML IJ SOLN
4.0000 mg | Freq: Once | INTRAMUSCULAR | Status: AC
  Administered 2011-12-03: 4 mg via INTRAVENOUS
  Filled 2011-12-03: qty 2

## 2011-12-03 MED ORDER — SODIUM CHLORIDE 0.9 % IV BOLUS (SEPSIS)
1000.0000 mL | Freq: Once | INTRAVENOUS | Status: AC
  Administered 2011-12-03: 1000 mL via INTRAVENOUS

## 2011-12-03 MED ORDER — ACETAMINOPHEN 325 MG PO TABS
975.0000 mg | ORAL_TABLET | Freq: Once | ORAL | Status: AC
  Administered 2011-12-03: 975 mg via ORAL
  Filled 2011-12-03: qty 3

## 2011-12-03 MED ORDER — ONDANSETRON HCL 4 MG PO TABS: 4.0000 mg | ORAL_TABLET | Freq: Three times a day (TID) | ORAL | Status: DC | PRN

## 2011-12-03 MED ORDER — NITROFURANTOIN MONOHYD MACRO 100 MG PO CAPS: 100.0000 mg | ORAL_CAPSULE | Freq: Two times a day (BID) | ORAL | Status: DC

## 2011-12-03 NOTE — ED Provider Notes (Signed)
History     CSN: [id number]  Arrival date & time 12/03/11  1126   First MD Initiated Contact with Patient 12/03/11 1542      Chief Complaint  Patient presents with  . Nausea  . Headache  . Fatigue    (Consider location/radiation/quality/duration/timing/severity/associated sxs/prior treatment) HPI  21 year old [redacted] weeks pregnant G2P1 female in no acute distress complaining of nausea/vomitting, headache and "not feeling right" x2 weeks. Patient is tolerating by mouth now, last episode of vomiting was last night. Headache is rated at 8/10, frontal, nonradiating typical of her prior exacerbations, not exacerbated by valsalva, not relieved with acetaminophen. She denies any abdominal pain, vaginal bleeding or discharge, she has not established a primary care or OB/GYN yet.   Past Medical History  Diagnosis Date  . No pertinent past medical history   . UTI (lower urinary tract infection)   . Hypertension     Past Surgical History  Procedure Date  . No past surgeries     Family History  Problem Relation Age of Onset  . Asthma Mother   . Hypertension Mother   . Diabetes Maternal Grandmother   . Hypertension Maternal Grandmother   . Stroke Maternal Grandmother   . Anesthesia problems Neg Hx   . Malignant hyperthermia Neg Hx   . Pseudochol deficiency Neg Hx   . Hypotension Neg Hx     History  Substance Use Topics  . Smoking status: Never Smoker   . Smokeless tobacco: Never Used  . Alcohol Use: 2.4 oz/week    4 Cans of beer per week    OB History    Grav Para Term Preterm Abortions TAB SAB Ect Mult Living   2 1 1  0 0 0 0 0 0 1      Review of Systems  Constitutional: Negative for fever.  Respiratory: Negative for shortness of breath.   Cardiovascular: Negative for chest pain.  Gastrointestinal: Positive for nausea and vomiting. Negative for abdominal pain and diarrhea.  Neurological: Positive for headaches.  All other systems reviewed and are  negative.    Allergies  Amoxicillin and Percocet  Home Medications   Current Outpatient Rx  Name Route Sig Dispense Refill  . RANITIDINE HCL 150 MG PO TABS Oral Take 1 tablet (150 mg total) by mouth 2 (two) times daily. 60 tablet 1    BP 119/73  Pulse 96  Temp 98.9 F (37.2 C) (Oral)  Resp 26  SpO2 100%  LMP 10/17/2011  Breastfeeding? No  Physical Exam  Nursing note and vitals reviewed. Constitutional: She is oriented to person, place, and time. She appears well-developed and well-nourished. No distress.       Obese  HENT:  Head: Normocephalic and atraumatic.  Right Ear: External ear normal.  Mouth/Throat: Oropharynx is clear and moist.  Eyes: Conjunctivae normal and EOM are normal. Pupils are equal, round, and reactive to light.  Neck: Normal range of motion.  Cardiovascular: Normal rate, regular rhythm and normal heart sounds.   Pulmonary/Chest: Effort normal and breath sounds normal. No stridor. No respiratory distress. She has no wheezes. She exhibits no tenderness.  Abdominal: Soft. Bowel sounds are normal. She exhibits no distension and no mass. There is no tenderness. There is no rebound and no guarding.  Genitourinary:       Pt deferred pelvic  Musculoskeletal: Normal range of motion.  Neurological: She is alert and oriented to person, place, and time.  Psychiatric: She has a normal mood and affect.  ED Course  Procedures (including critical care time)  Labs Reviewed  CBC WITH DIFFERENTIAL - Abnormal; Notable for the following:    WBC 10.6 (*)     Hemoglobin 11.2 (*)     HCT 35.4 (*)     MCV 73.6 (*)     MCH 23.3 (*)     RDW 18.2 (*)     All other components within normal limits  BASIC METABOLIC PANEL - Abnormal; Notable for the following:    Sodium 134 (*)     Potassium 3.4 (*)     All other components within normal limits  POCT PREGNANCY, URINE - Abnormal; Notable for the following:    Preg Test, Ur POSITIVE (*)     All other components within  normal limits  URINALYSIS, WITH MICROSCOPIC - Abnormal; Notable for the following:    APPearance CLOUDY (*)     pH 8.5 (*)     Leukocytes, UA MODERATE (*)     Squamous Epithelial / LPF MANY (*)     All other components within normal limits  URINALYSIS, ROUTINE W REFLEX MICROSCOPIC  URINE CULTURE   No results found.   1. Generalized headaches   2. Dehydration   3. UTI (lower urinary tract infection)   4. Nausea       MDM  21 y/o female G2P1 [redacted] weeks pregnant c/o Nausea vomiting, HA x2 weeks. Pt is tachycardiac to 110's. I will bolus 1 L, otherwise vitals and PE is unremarkable. I will also start her first dose of macrobid for UTI as evidenced on UA.   4:38 PM Pt seen and reevaluated, States her only complaint is that she is tired. Advised her to stay well hydrated as she appears rehydrated. Pt states that sh cannot drink water because she vomits it but can only drink juice in pregnancy.   Patient heart rate normalized to 97 beats per minute after 1 L of hydration. I will discharge her on Macrobid. Advised her to follow with Ob  New Prescriptions   NITROFURANTOIN, MACROCRYSTAL-MONOHYDRATE, (MACROBID) 100 MG CAPSULE    Take 1 capsule (100 mg total) by mouth 2 (two) times daily. X 7 days   ONDANSETRON (ZOFRAN) 4 MG TABLET    Take 1 tablet (4 mg total) by mouth every 8 (eight) hours as needed for nausea.       Wynetta Emery, PA-C 12/03/11 1707

## 2011-12-03 NOTE — ED Provider Notes (Signed)
Medical screening examination/treatment/procedure(s) were performed by non-physician practitioner and as supervising physician I was immediately available for consultation/collaboration.   Richardean Canal, MD 12/03/11 [phone]

## 2011-12-03 NOTE — ED Notes (Signed)
Pt sts she is [redacted] weeks pregnant and sts having nausea, fatigue and HA

## 2011-12-06 LAB — URINE CULTURE: Colony Count: >=100000

## 2011-12-07 NOTE — ED Notes (Signed)
+  Urine. Patient given Macrobid. No sensitivity listed. Chart sent to EDP office for review.

## 2011-12-08 ENCOUNTER — Telehealth (HOSPITAL_COMMUNITY): Payer: Self-pay | Admitting: Emergency Medicine

## 2011-12-08 NOTE — ED Notes (Signed)
Chart returned from EDP office. Prescribed Keflex 500 mg tab. #14. One tab PO Q 12 hrs x 7 days. Prescribed by Jaynie Crumble PA-C.

## 2011-12-09 ENCOUNTER — Inpatient Hospital Stay (HOSPITAL_COMMUNITY)
Admission: AD | Admit: 2011-12-09 | Discharge: 2011-12-09 | Disposition: A | Discharge status: Home / Self Care | Payer: Medicaid Other | Source: Ambulatory Visit | Attending: Obstetrics & Gynecology | Admitting: Obstetrics & Gynecology

## 2011-12-09 ENCOUNTER — Encounter (HOSPITAL_COMMUNITY): Payer: Self-pay | Admitting: *Deleted

## 2011-12-09 DIAGNOSIS — O210 Mild hyperemesis gravidarum: Secondary | ICD-10-CM | POA: Insufficient documentation

## 2011-12-09 DIAGNOSIS — K920 Hematemesis: Secondary | ICD-10-CM | POA: Insufficient documentation

## 2011-12-09 DIAGNOSIS — R112 Nausea with vomiting, unspecified: Secondary | ICD-10-CM

## 2011-12-09 LAB — URINALYSIS, ROUTINE W REFLEX MICROSCOPIC
Bilirubin Urine: NEGATIVE
Glucose, UA: NEGATIVE mg/dL
Hgb urine dipstick: NEGATIVE
Ketones, ur: 40 mg/dL — AB
Leukocytes, UA: NEGATIVE
Nitrite: NEGATIVE
Protein, ur: NEGATIVE mg/dL
Specific Gravity, Urine: 1.025 (ref 1.005–1.030)
Urobilinogen, UA: 0.2 mg/dL (ref 0.0–1.0)
pH: 6.5 (ref 5.0–8.0)

## 2011-12-09 MED ORDER — ONDANSETRON 8 MG PO TBDP: 8.0000 mg | ORAL_TABLET | Freq: Once | ORAL | Status: DC

## 2011-12-09 MED ORDER — ONDANSETRON 8 MG PO TBDP
8.0000 mg | ORAL_TABLET | Freq: Once | ORAL | Status: AC
  Administered 2011-12-09: 8 mg via ORAL
  Filled 2011-12-09: qty 1

## 2011-12-09 NOTE — MAU Provider Note (Signed)
Medical Screening exam and patient care preformed by advanced practice provider.  Agree with the above management.  

## 2011-12-09 NOTE — MAU Provider Note (Signed)
History     CSN: [id number]  Arrival date and time: 12/09/11 1410   First Provider Initiated Contact with Patient 12/09/11 1621      Chief Complaint  Patient presents with  . Abdominal Pain  . Hematemesis   HPI Emma Chambers is 21 y.o. G2P1001 [redacted]w[redacted]d weeks presenting with vomiting blood than began today.  Vomited X 4 today and 5th time saw blood.  Has had morning sickness.  Was seen [facility] for same sxs.  Was dehydrated, tx with IV hydration.  Tx with Phenergan which does not help.  Had to take Zofran with her previous pregnancy.   Has not chosen OB doctor.      Past Medical History  Diagnosis Date  . No pertinent past medical history   . UTI (lower urinary tract infection)   . Hypertension     Past Surgical History  Procedure Date  . No past surgeries     Family History  Problem Relation Age of Onset  . Asthma Mother   . Hypertension Mother   . Diabetes Maternal Grandmother   . Hypertension Maternal Grandmother   . Stroke Maternal Grandmother   . Anesthesia problems Neg Hx   . Malignant hyperthermia Neg Hx   . Pseudochol deficiency Neg Hx   . Hypotension Neg Hx     History  Substance Use Topics  . Smoking status: Never Smoker   . Smokeless tobacco: Never Used  . Alcohol Use: No    Allergies:  Allergies  Allergen Reactions  . Amoxicillin Nausea And Vomiting and Other (See Comments)    Tics & twitches  . Percocet (Oxycodone-Acetaminophen) Other (See Comments)    My arm was burning    Prescriptions prior to admission  Medication Sig Dispense Refill  . nitrofurantoin, macrocrystal-monohydrate, (MACROBID) 100 MG capsule Take 1 capsule (100 mg total) by mouth 2 (two) times daily. X 7 days  14 capsule  0  . ondansetron (ZOFRAN) 4 MG tablet Take 1 tablet (4 mg total) by mouth every 8 (eight) hours as needed for nausea.  10 tablet  0  . ranitidine (ZANTAC) 150 MG tablet Take 1 tablet (150 mg total) by mouth 2 (two) times daily.  60 tablet  1    Review of  Systems  Constitutional: Negative.   HENT: Negative.   Respiratory: Negative.   Cardiovascular: Negative.   Gastrointestinal: Positive for nausea, vomiting and abdominal pain (occ sharp).  Genitourinary:       Negative for discharge and vaginal bleeding   Physical Exam   Blood pressure 113/64, pulse 101, temperature 98.3 F (36.8 C), temperature source Oral, resp. rate 20, height 5\' 1"  (1.549 m), weight 103.42 kg (228 lb), last menstrual period 10/17/2011, unknown if currently breastfeeding.  Physical Exam  Constitutional: She appears well-developed and well-nourished. No distress.  HENT:  Head: Normocephalic.  Mouth/Throat: Mucous membranes are normal. No oropharyngeal exudate, posterior oropharyngeal edema or posterior oropharyngeal erythema.  Neck: Normal range of motion.  Cardiovascular: Normal rate.   Respiratory: Effort normal.  GI: Soft. She exhibits no distension and no mass. There is no tenderness. There is no rebound and no guarding.  Skin: Skin is warm and dry.  Psychiatric: Her behavior is normal.   Results for orders placed during the hospital encounter of 12/09/11 (from the past 24 hour(s))  URINALYSIS, ROUTINE W REFLEX MICROSCOPIC     Status: Abnormal   Collection Time   12/09/11  3:31 PM      Component Value Range  Color, Urine YELLOW  YELLOW   APPearance HAZY (*) CLEAR   Specific Gravity, Urine 1.025  1.005 - 1.030   pH 6.5  5.0 - 8.0   Glucose, UA NEGATIVE  NEGATIVE mg/dL   Hgb urine dipstick NEGATIVE  NEGATIVE   Bilirubin Urine NEGATIVE  NEGATIVE   Ketones, ur 40 (*) NEGATIVE mg/dL   Protein, ur NEGATIVE  NEGATIVE mg/dL   Urobilinogen, UA 0.2  0.0 - 1.0 mg/dL   Nitrite NEGATIVE  NEGATIVE   Leukocytes, UA NEGATIVE  NEGATIVE   MAU Course  Procedures  MDM  16:30  Discussed with the patient her UA--40 ketones but SG is 10.25.  She declined 1 liter of IV FLuids.  She requests one Zofran ODT here and RX Assessment and Plan  A:  Nausea and vomiting in  pregnancy     Urine --40 ketones P:Zofran 8mg  ODT given in MAU    Rx for Zofran for home    Encouraged to begin prenatal care with doctor of her choice.  KEY,EVE M 12/09/2011, 4:22 PM

## 2011-12-09 NOTE — MAU Note (Signed)
Had eaten something. Was running errands.  Got sick, threw up,emptied stomach- then a few minutes later was feeling like going to throw up again, was coughing and gagging and there was blood.  Has been having sharp pains all over stomach.

## 2011-12-11 ENCOUNTER — Telehealth (HOSPITAL_COMMUNITY): Payer: Self-pay | Admitting: *Deleted

## 2012-01-03 ENCOUNTER — Inpatient Hospital Stay (HOSPITAL_COMMUNITY)
Admission: AD | Admit: 2012-01-03 | Discharge: 2012-01-03 | Disposition: A | Discharge status: Home / Self Care | Payer: Medicaid Other | Source: Ambulatory Visit | Attending: Obstetrics & Gynecology | Admitting: Obstetrics & Gynecology

## 2012-01-03 ENCOUNTER — Encounter (HOSPITAL_COMMUNITY): Payer: Self-pay | Admitting: *Deleted

## 2012-01-03 DIAGNOSIS — O210 Mild hyperemesis gravidarum: Secondary | ICD-10-CM | POA: Insufficient documentation

## 2012-01-03 LAB — URINALYSIS, ROUTINE W REFLEX MICROSCOPIC
Bilirubin Urine: NEGATIVE
Glucose, UA: NEGATIVE mg/dL
Hgb urine dipstick: NEGATIVE
Ketones, ur: 15 mg/dL — AB
Nitrite: NEGATIVE
Protein, ur: NEGATIVE mg/dL
Specific Gravity, Urine: >1.03 — ABNORMAL HIGH (ref 1.005–1.030)
Urobilinogen, UA: 1 mg/dL (ref 0.0–1.0)
pH: 6 (ref 5.0–8.0)

## 2012-01-03 LAB — URINE MICROSCOPIC-ADD ON

## 2012-01-03 MED ORDER — ACETAMINOPHEN 325 MG PO TABS
650.0000 mg | ORAL_TABLET | Freq: Once | ORAL | Status: AC
  Administered 2012-01-03: 650 mg via ORAL
  Filled 2012-01-03: qty 2

## 2012-01-03 MED ORDER — ONDANSETRON HCL 4 MG/2ML IJ SOLN
4.0000 mg | Freq: Once | INTRAMUSCULAR | Status: AC
  Administered 2012-01-03: 4 mg via INTRAVENOUS
  Filled 2012-01-03: qty 2

## 2012-01-03 MED ORDER — PROMETHAZINE HCL 25 MG PO TABS: 25.0000 mg | ORAL_TABLET | Freq: Four times a day (QID) | ORAL | Status: DC | PRN

## 2012-01-03 MED ORDER — DEXTROSE 5 % IN LACTATED RINGERS IV BOLUS: 1000.0000 mL | Freq: Once | INTRAVENOUS | Status: DC

## 2012-01-03 MED ORDER — THIAMINE HCL 100 MG/ML IJ SOLN
Freq: Once | INTRAVENOUS | Status: AC
  Administered 2012-01-03: 16:00:00 via INTRAVENOUS
  Filled 2012-01-03: qty 1000

## 2012-01-03 MED ORDER — PROMETHAZINE HCL 25 MG/ML IJ SOLN: 25.0000 mg | Freq: Once | INTRAMUSCULAR | Status: DC

## 2012-01-03 MED ORDER — FAMOTIDINE IN NACL 20-0.9 MG/50ML-% IV SOLN
20.0000 mg | Freq: Once | INTRAVENOUS | Status: AC
  Administered 2012-01-03: 20 mg via INTRAVENOUS
  Filled 2012-01-03: qty 50

## 2012-01-03 MED ORDER — PROMETHAZINE HCL 25 MG/ML IJ SOLN
25.0000 mg | Freq: Once | INTRAVENOUS | Status: AC
  Administered 2012-01-03: 25 mg via INTRAVENOUS
  Filled 2012-01-03: qty 1

## 2012-01-03 NOTE — MAU Provider Note (Signed)
  History     CSN: [id number]  Arrival date and time: 01/03/12 1132   First Provider Initiated Contact with Patient 01/03/12 1206      Chief Complaint  Patient presents with  . Emesis During Pregnancy   HPI  Pt is [redacted]w[redacted]d pregnant and has had nausea and vomiting with her pregnancy every day.  She has been seen 2 times in ED, last visit on 12/09/2011 and was given zofran .  Pt states that she has not gotten relief with Zofran.  She says everything she eats is vomited.  Pt denies constipation, diarrhea, UTI symptoms, vaginal discharge/spotting or bleeding.    Past Medical History  Diagnosis Date  . UTI (lower urinary tract infection)   . Hypertension     Past Surgical History  Procedure Date  . No past surgeries     Family History  Problem Relation Age of Onset  . Asthma Mother   . Hypertension Mother   . Diabetes Maternal Grandmother   . Hypertension Maternal Grandmother   . Stroke Maternal Grandmother   . Anesthesia problems Neg Hx   . Malignant hyperthermia Neg Hx   . Pseudochol deficiency Neg Hx   . Hypotension Neg Hx     History  Substance Use Topics  . Smoking status: Never Smoker   . Smokeless tobacco: Never Used  . Alcohol Use: No    Allergies:  Allergies  Allergen Reactions  . Amoxicillin Nausea And Vomiting and Other (See Comments)    Tics & twitches  . Percocet (Oxycodone-Acetaminophen) Other (See Comments)    My arm was burning    Prescriptions prior to admission  Medication Sig Dispense Refill  . ondansetron (ZOFRAN-ODT) 8 MG disintegrating tablet Take 1 tablet (8 mg total) by mouth once.  20 tablet  0  . ranitidine (ZANTAC) 150 MG tablet Take 1 tablet (150 mg total) by mouth 2 (two) times daily.  60 tablet  1    Review of Systems  Constitutional: Negative for fever and chills.  HENT:       Frontal headache for 1 week- no relief with Tylenol  Respiratory: Positive for cough.        Cough for 2 weeks with some yellow productive sputum at  times.  Gastrointestinal: Positive for nausea, vomiting and abdominal pain. Negative for diarrhea and constipation.       Upper abdominal pain with vomiting  Genitourinary: Negative for dysuria and urgency.  Neurological: Positive for dizziness and headaches.   Physical Exam   Blood pressure 135/73, pulse 125, temperature 99 F (37.2 C), temperature source Oral, resp. rate 20, height 5\' 1"  (1.549 m), weight 102.967 kg (227 lb), last menstrual period 10/17/2011, SpO2 100.00%, unknown if currently breastfeeding.  Physical Exam  MAU Course  Procedures Pt was given 1000cc D5LR with phenergan 25mg  and Pepcid 20mg  IV Pt was asking for food but said she didn't feel any better 1000cc banana bag IV infused Pt tolerating crackers and PO fluids  Assessment and Plan  Nausea and vomiting in pregnancy Prescription for phenergan 25mg  tablets and diet instructions for hyperemesis gravidarum F/u for OB care with provider of choice  Sanela Evola 01/03/2012, 12:07 PM

## 2012-01-03 NOTE — MAU Note (Signed)
Also has a bad cold, productive cough for past 2 wks.

## 2012-01-03 NOTE — MAU Note (Signed)
Ongoing vomiting past couple wks, can't keep anything down.  Has been having pain in mid abd, comes and goes.

## 2012-01-06 NOTE — MAU Provider Note (Signed)
Attestation of Attending Supervision of Advanced Practitioner (CNM/NP): Evaluation and management procedures were performed by the Advanced Practitioner under my supervision and collaboration.  I have reviewed the Advanced Practitioner's note and chart, and I agree with the management and plan.  HARRAWAY-SMITH, Ihsan Nomura 2:00 PM

## 2012-01-30 LAB — OB RESULTS CONSOLE GC/CHLAMYDIA
Chlamydia: NEGATIVE
Gonorrhea: NEGATIVE

## 2012-01-30 LAB — OB RESULTS CONSOLE HIV ANTIBODY (ROUTINE TESTING): HIV: NONREACTIVE

## 2012-01-30 LAB — CYTOLOGY - PAP: CYTOLOGY - PAP: NORMAL

## 2012-02-07 ENCOUNTER — Encounter (HOSPITAL_COMMUNITY): Payer: Self-pay | Admitting: *Deleted

## 2012-02-07 ENCOUNTER — Inpatient Hospital Stay (HOSPITAL_COMMUNITY)
Admission: AD | Admit: 2012-02-07 | Discharge: 2012-02-07 | Disposition: A | Discharge status: Home / Self Care | Payer: Medicaid Other | Source: Ambulatory Visit | Attending: Obstetrics | Admitting: Obstetrics

## 2012-02-07 DIAGNOSIS — O99891 Other specified diseases and conditions complicating pregnancy: Secondary | ICD-10-CM | POA: Insufficient documentation

## 2012-02-07 DIAGNOSIS — G4700 Insomnia, unspecified: Secondary | ICD-10-CM

## 2012-02-07 DIAGNOSIS — N949 Unspecified condition associated with female genital organs and menstrual cycle: Secondary | ICD-10-CM

## 2012-02-07 DIAGNOSIS — R51 Headache: Secondary | ICD-10-CM | POA: Insufficient documentation

## 2012-02-07 DIAGNOSIS — O26899 Other specified pregnancy related conditions, unspecified trimester: Secondary | ICD-10-CM

## 2012-02-07 DIAGNOSIS — R109 Unspecified abdominal pain: Secondary | ICD-10-CM | POA: Insufficient documentation

## 2012-02-07 DIAGNOSIS — Z30430 Encounter for insertion of intrauterine contraceptive device: Secondary | ICD-10-CM

## 2012-02-07 LAB — URINALYSIS, ROUTINE W REFLEX MICROSCOPIC
Bilirubin Urine: NEGATIVE
Glucose, UA: NEGATIVE mg/dL
Hgb urine dipstick: NEGATIVE
Ketones, ur: 15 mg/dL — AB
Nitrite: NEGATIVE
Protein, ur: NEGATIVE mg/dL
Specific Gravity, Urine: 1.025 (ref 1.005–1.030)
Urobilinogen, UA: 1 mg/dL (ref 0.0–1.0)
pH: 6.5 (ref 5.0–8.0)

## 2012-02-07 LAB — URINE MICROSCOPIC-ADD ON

## 2012-02-07 MED ORDER — HYDROMORPHONE HCL 2 MG PO TABS
1.0000 mg | ORAL_TABLET | Freq: Once | ORAL | Status: AC
  Administered 2012-02-07: 1 mg via ORAL
  Filled 2012-02-07: qty 1

## 2012-02-07 MED ORDER — ZOLPIDEM TARTRATE ER 12.5 MG PO TBCR: 12.5000 mg | EXTENDED_RELEASE_TABLET | Freq: Every evening | ORAL | Status: DC | PRN

## 2012-02-07 MED ORDER — CONCEPT OB 130-92.4-1 MG PO CAPS: 1.0000 | ORAL_CAPSULE | ORAL | Status: DC

## 2012-02-07 NOTE — MAU Note (Signed)
C/o headache for past month; c/o cramping and vaginal pressure for past 2 days;

## 2012-02-07 NOTE — MAU Provider Note (Signed)
Chief Complaint:  Abdominal Cramping and Headache   First Provider Initiated Contact with Patient 02/07/12 1451      HPI: Emma Chambers is a 21 y.o. G2P1001 at [redacted]w[redacted]d who presents to maternity admissions reporting Sharp frontal headache that has been continuous for about a month. She states she wakes with a headache. Of note she is having insomnia and usually does not go to sleep until 3 AM then wakes up at 5am and stays up from 7am. Home with daughter and takes naps in daytime. Denies any focal neurologic symptoms. No fever, chills, dizziness. Tylenol not helping headache. Benadryl not helping for sleep. She is also concerned with sharp stabbing pains in groin especially when she opens her legs. It is associated with pelvic pressure. Denies dysuria, hematuria, frequency or urgency of urination. Denies contractions, leakage of fluid or vaginal bleeding. Good fetal movement.   Pregnancy Course: Uncomplicated  Past Medical History: Past Medical History  Diagnosis Date  . UTI (lower urinary tract infection)   . Hypertension     Past obstetric history: OB History    Grav Para Term Preterm Abortions TAB SAB Ect Mult Living   2 1 1  0 0 0 0 0 0 1     # Outc Date GA Lbr Len/2nd Wgt Sex Del Anes PTL Lv   1 TRM 3/13 [redacted]w[redacted]d 22:02 / 00:26 8lb1.6oz(3.675kg) F SVD EPI  Yes   Comments: none   2 CUR               Past Surgical History: Past Surgical History  Procedure Date  . No past surgeries     Family History: Family History  Problem Relation Age of Onset  . Asthma Mother   . Hypertension Mother   . Diabetes Maternal Grandmother   . Hypertension Maternal Grandmother   . Stroke Maternal Grandmother   . Anesthesia problems Neg Hx   . Malignant hyperthermia Neg Hx   . Pseudochol deficiency Neg Hx   . Hypotension Neg Hx     Social History: History  Substance Use Topics  . Smoking status: Never Smoker   . Smokeless tobacco: Never Used  . Alcohol Use: No    Allergies:    Allergies  Allergen Reactions  . Amoxicillin Nausea And Vomiting and Other (See Comments)    Tics & twitches  . Percocet (Oxycodone-Acetaminophen) Other (See Comments)    My arm was burning    Meds:  Prescriptions prior to admission  Medication Sig Dispense Refill  . acetaminophen-codeine (TYLENOL #3) 300-30 MG per tablet Take 1 tablet by mouth every 4 (four) hours as needed. For pain/headache        ROS: Pertinent findings in history of present illness.  Physical Exam  Blood pressure 128/80, pulse 124, temperature 99.5 F (37.5 C), temperature source Oral, resp. rate 18, height 5' (1.524 m), weight 103.874 kg (229 lb), last menstrual period 10/17/2011, unknown if currently breastfeeding. GENERAL: Well-developed, well-nourished female in no acute distress.  HEENT: normocephalic NECK: supple HEART: normal rate BACK: neg CVAT RESP: normal effort ABDOMEN: Soft, non-tender, gravid appropriate for gestational age. FHR 130s EXTREMITIES: Nontender, no edema NEURO: alert and oriented   Dilation: Closed Effacement (%): Thick Exam by:: D Jaleel Allen CNM Labs: Results for orders placed during the hospital encounter of 02/07/12 (from the past 24 hour(s))  URINALYSIS, ROUTINE W REFLEX MICROSCOPIC     Status: Abnormal   Collection Time   02/07/12  2:24 PM      Component Value Range  Color, Urine YELLOW  YELLOW   APPearance HAZY (*) CLEAR   Specific Gravity, Urine 1.025  1.005 - 1.030   pH 6.5  5.0 - 8.0   Glucose, UA NEGATIVE  NEGATIVE mg/dL   Hgb urine dipstick NEGATIVE  NEGATIVE   Bilirubin Urine NEGATIVE  NEGATIVE   Ketones, ur 15 (*) NEGATIVE mg/dL   Protein, ur NEGATIVE  NEGATIVE mg/dL   Urobilinogen, UA 1.0  0.0 - 1.0 mg/dL   Nitrite NEGATIVE  NEGATIVE   Leukocytes, UA SMALL (*) NEGATIVE  URINE MICROSCOPIC-ADD ON     Status: Abnormal   Collection Time   02/07/12  2:24 PM      Component Value Range   Squamous Epithelial / LPF MANY (*) RARE   WBC, UA 3-6  <3 WBC/hpf   RBC /  HPF 0-2  <3 RBC/hpf   Bacteria, UA MANY (*) RARE   Urine-Other MUCOUS PRESENT      Imaging:  Bedside US - positive FM and FHR (done because FHR sounded slow with DT initially)  MAU Course: Dilaudid 1 mg po with relief  Assessment: 1. Headache in pregnancy   2. Round ligament pain   3. Insomnia   G2P1001 at [redacted]w[redacted]d, tension headache   Plan: Discharge home See AVS. Discussed relaxation, sleep hygiene. Increase Tylenol to maximum doses if needed for H/A. F/U Dr. Gaynell Face as scheduled    Medication List     As of 02/07/2012  3:32 PM    TAKE these medications         acetaminophen-codeine 300-30 MG per tablet   Commonly known as: TYLENOL #3   Take 1 tablet by mouth every 4 (four) hours as needed. For pain/headache      CONCEPT OB 130-92.4-1 MG Caps   Take 1 tablet by mouth 1 day or 1 dose.      zolpidem 12.5 MG CR tablet   Commonly known as: AMBIEN CR   Take 1 tablet (12.5 mg total) by mouth at bedtime as needed for sleep.         Danae Orleans, CNM 02/07/2012 3:14 PM

## 2012-02-09 LAB — URINE CULTURE: Colony Count: 55000

## 2012-03-18 NOTE — L&D Delivery Note (Signed)
Delivery Note Pt pushed well and at 1:28 AM a viable female was delivered via Vaginal, Spontaneous Delivery (Presentation: Left Occiput Anterior).  APGAR: 9, 9; weight 5 lb 10.6 oz (2568 g).  Infant to pt's abd and dried. Cord clamped and cut by FOB. Cord blood collected.  Placenta status: Intact, Spontaneous.  Cord: 3 vessels with the following complications: Knot-> appeared to be present but was simply the way the cord was lying.    Anesthesia: Epidural  Episiotomy: None Lacerations: None Est. Blood Loss (mL): 250  Mom to postpartum.  Baby to nursery-stable.  Cam Hai 06/29/2012, 2:58 AM

## 2012-03-18 NOTE — L&D Delivery Note (Signed)
Attestation of Attending Supervision of Advanced Practitioner: Evaluation and management procedures were performed by the PA/NP/CNM/OB Fellow under my supervision/collaboration. Chart reviewed and agree with management and plan.  Catina Nuss V 06/30/2012 11:53 AM

## 2012-03-20 ENCOUNTER — Encounter (HOSPITAL_COMMUNITY): Payer: Self-pay | Admitting: *Deleted

## 2012-03-20 ENCOUNTER — Inpatient Hospital Stay (HOSPITAL_COMMUNITY)
Admission: AD | Admit: 2012-03-20 | Discharge: 2012-03-20 | Disposition: A | Discharge status: Home / Self Care | Payer: Medicaid Other | Source: Ambulatory Visit | Attending: Obstetrics | Admitting: Obstetrics

## 2012-03-20 DIAGNOSIS — R109 Unspecified abdominal pain: Secondary | ICD-10-CM | POA: Insufficient documentation

## 2012-03-20 DIAGNOSIS — N949 Unspecified condition associated with female genital organs and menstrual cycle: Secondary | ICD-10-CM

## 2012-03-20 DIAGNOSIS — O219 Vomiting of pregnancy, unspecified: Secondary | ICD-10-CM

## 2012-03-20 DIAGNOSIS — O99891 Other specified diseases and conditions complicating pregnancy: Secondary | ICD-10-CM | POA: Insufficient documentation

## 2012-03-20 LAB — URINALYSIS, ROUTINE W REFLEX MICROSCOPIC
Bilirubin Urine: NEGATIVE
Glucose, UA: NEGATIVE mg/dL
Hgb urine dipstick: NEGATIVE
Ketones, ur: 40 mg/dL — AB
Nitrite: NEGATIVE
Protein, ur: NEGATIVE mg/dL
Specific Gravity, Urine: 1.025 (ref 1.005–1.030)
Urobilinogen, UA: 1 mg/dL (ref 0.0–1.0)
pH: 7 (ref 5.0–8.0)

## 2012-03-20 LAB — URINE MICROSCOPIC-ADD ON

## 2012-03-20 LAB — WET PREP, GENITAL
Clue Cells Wet Prep HPF POC: NONE SEEN
Trich, Wet Prep: NONE SEEN
Yeast Wet Prep HPF POC: NONE SEEN

## 2012-03-20 MED ORDER — TRAMADOL HCL 50 MG PO TABS
50.0000 mg | ORAL_TABLET | Freq: Once | ORAL | Status: AC
  Administered 2012-03-20: 50 mg via ORAL
  Filled 2012-03-20: qty 1

## 2012-03-20 MED ORDER — DOXYLAMINE SUCCINATE (SLEEP) 25 MG PO TABS
12.5000 mg | ORAL_TABLET | Freq: Once | ORAL | Status: AC
  Administered 2012-03-20: 12.5 mg via ORAL
  Filled 2012-03-20: qty 1

## 2012-03-20 MED ORDER — VITAMIN B-6 50 MG PO TABS
50.0000 mg | ORAL_TABLET | Freq: Once | ORAL | Status: AC
  Administered 2012-03-20: 50 mg via ORAL
  Filled 2012-03-20: qty 1

## 2012-03-20 NOTE — MAU Note (Signed)
C/o leaking when she vomited; c/o intermittent sharp pain in abdomen; ?SROM

## 2012-03-20 NOTE — MAU Provider Note (Signed)
History     CSN: [id number]  Arrival date and time: 03/20/12 1329   First Provider Initiated Contact with Patient 03/20/12 1347      Chief Complaint  Patient presents with  . Emesis   HPI This is a 22 y.o. at [redacted]w[redacted]d who presents with c/o leaking fluid during an episode of vomiting. Has had vomiting off and on during pregnancy, but had improved. Was eating chicken nuggets and suddenly felt nauseated. Fluid was clear and ran down leg. No bleeding. Has also had sharp pains mostly at night on sides of lower abdomen for several weeks. Not sure if it feels like contractions. Also hurts with walking.   OB History    Grav Para Term Preterm Abortions TAB SAB Ect Mult Living   2 1 1  0 0 0 0 0 0 1      Past Medical History  Diagnosis Date  . UTI (lower urinary tract infection)   . Hypertension     Past Surgical History  Procedure Date  . No past surgeries     Family History  Problem Relation Age of Onset  . Asthma Mother   . Hypertension Mother   . Diabetes Maternal Grandmother   . Hypertension Maternal Grandmother   . Stroke Maternal Grandmother   . Anesthesia problems Neg Hx   . Malignant hyperthermia Neg Hx   . Pseudochol deficiency Neg Hx   . Hypotension Neg Hx     History  Substance Use Topics  . Smoking status: Never Smoker   . Smokeless tobacco: Never Used  . Alcohol Use: No    Allergies:  Allergies  Allergen Reactions  . Amoxicillin Nausea And Vomiting and Other (See Comments)    Tics & twitches  . Percocet (Oxycodone-Acetaminophen) Other (See Comments)    My arm was burning    Prescriptions prior to admission  Medication Sig Dispense Refill  . acetaminophen-codeine (TYLENOL #3) 300-30 MG per tablet Take 1 tablet by mouth every 4 (four) hours as needed. For pain/headache      . ranitidine (ZANTAC) 150 MG tablet Take 150 mg by mouth daily.      Marland Kitchen zolpidem (AMBIEN CR) 12.5 MG CR tablet Take 1 tablet (12.5 mg total) by mouth at bedtime as needed for sleep.   10 tablet  0    ROS See HPI  Physical Exam   Blood pressure 119/64, pulse 122, temperature 98.7 F (37.1 C), temperature source Oral, resp. rate 16, height 5\' 2"  (1.575 m), weight 190 lb (86.183 kg), last menstrual period 10/17/2011.  Physical Exam  Constitutional: She is oriented to person, place, and time. She appears well-developed and well-nourished. No distress.  Cardiovascular: Normal rate.   Respiratory: Effort normal.  GI: Soft. She exhibits no distension and no mass. There is no tenderness. There is no rebound and no guarding.  Genitourinary: Uterus normal. Vaginal discharge (thick white creamy discharge, No pooling, No ferning) found.       Cervix long and closed   Musculoskeletal: Normal range of motion.  Neurological: She is alert and oriented to person, place, and time.  Skin: Skin is warm and dry.  Psychiatric: She has a normal mood and affect.    MAU Course  Procedures  MDM Results for orders placed during the hospital encounter of 03/20/12 (from the past 24 hour(s))  URINALYSIS, ROUTINE W REFLEX MICROSCOPIC     Status: Abnormal   Collection Time   03/20/12 12:40 PM      Component Value Range  Color, Urine YELLOW  YELLOW   APPearance HAZY (*) CLEAR   Specific Gravity, Urine 1.025  1.005 - 1.030   pH 7.0  5.0 - 8.0   Glucose, UA NEGATIVE  NEGATIVE mg/dL   Hgb urine dipstick NEGATIVE  NEGATIVE   Bilirubin Urine NEGATIVE  NEGATIVE   Ketones, ur 40 (*) NEGATIVE mg/dL   Protein, ur NEGATIVE  NEGATIVE mg/dL   Urobilinogen, UA 1.0  0.0 - 1.0 mg/dL   Nitrite NEGATIVE  NEGATIVE   Leukocytes, UA TRACE (*) NEGATIVE  URINE MICROSCOPIC-ADD ON     Status: Abnormal   Collection Time   03/20/12 12:40 PM      Component Value Range   Squamous Epithelial / LPF FEW (*) RARE   WBC, UA 0-2  <3 WBC/hpf   Bacteria, UA FEW (*) RARE   Urine-Other MUCOUS PRESENT    WET PREP, GENITAL     Status: Abnormal   Collection Time   03/20/12  2:00 PM      Component Value Range   Yeast  Wet Prep HPF POC NONE SEEN  NONE SEEN   Trich, Wet Prep NONE SEEN  NONE SEEN   Clue Cells Wet Prep HPF POC NONE SEEN  NONE SEEN   WBC, Wet Prep HPF POC MANY (*) NONE SEEN   Will give antiemetic and tramadol for ligament pain   Assessment and Plan  A:  SIUP at [redacted]w[redacted]d       Probable urine leakage, no evidence of ROM      Probable round ligament pain, no cervical change  P:  Discharge after meds given        Follow up with Dr Gaynell Face.  Wynelle Bourgeois 03/20/2012, 2:05 PM

## 2012-04-12 ENCOUNTER — Encounter (HOSPITAL_COMMUNITY): Payer: Self-pay | Admitting: *Deleted

## 2012-04-12 ENCOUNTER — Inpatient Hospital Stay (HOSPITAL_COMMUNITY)
Admission: AD | Admit: 2012-04-12 | Discharge: 2012-04-12 | Disposition: A | Discharge status: Home / Self Care | Payer: Medicaid Other | Source: Ambulatory Visit | Attending: Obstetrics | Admitting: Obstetrics

## 2012-04-12 DIAGNOSIS — F29 Unspecified psychosis not due to a substance or known physiological condition: Secondary | ICD-10-CM | POA: Insufficient documentation

## 2012-04-12 DIAGNOSIS — O99891 Other specified diseases and conditions complicating pregnancy: Secondary | ICD-10-CM | POA: Insufficient documentation

## 2012-04-12 DIAGNOSIS — T887XXA Unspecified adverse effect of drug or medicament, initial encounter: Secondary | ICD-10-CM

## 2012-04-12 LAB — URINALYSIS, ROUTINE W REFLEX MICROSCOPIC
Bilirubin Urine: NEGATIVE
Glucose, UA: NEGATIVE mg/dL
Ketones, ur: 40 mg/dL — AB
Nitrite: NEGATIVE
Protein, ur: 30 mg/dL — AB
Specific Gravity, Urine: 1.025 (ref 1.005–1.030)
Urobilinogen, UA: 0.2 mg/dL (ref 0.0–1.0)
pH: 6.5 (ref 5.0–8.0)

## 2012-04-12 LAB — URINE MICROSCOPIC-ADD ON

## 2012-04-12 LAB — RAPID URINE DRUG SCREEN, HOSP PERFORMED
Amphetamines: NOT DETECTED
Barbiturates: NOT DETECTED
Benzodiazepines: NOT DETECTED
Cocaine: NOT DETECTED
Opiates: POSITIVE — AB
Tetrahydrocannabinol: NOT DETECTED

## 2012-04-12 NOTE — MAU Note (Signed)
Emma Chambers presents from home with complaints of pain in her right side, states she thinks she is having a medication reaction from a new prescription given to her last week

## 2012-04-12 NOTE — MAU Provider Note (Signed)
History     CSN: [id number]  Arrival date and time: 04/12/12 1236   First Provider Initiated Contact with Patient 04/12/12 1328      Chief Complaint  Patient presents with  . Emesis  . Medication Reaction   HPI Emma Chambers is 22 y.o. G2P1001 [redacted]w[redacted]d weeks presenting with complaint of " I am out of mind and I have stomach and back pain".  Patient of Dr. Modena Nunnery seen in the office 3 weeks.  The pain began 2 days ago when the baby began kicking too much.  She has Rx for tylenol #3 that she took at 7-8pm last night and then was still in pain and couldn't sleep so she took an Ambien at 2-3am this am.  Her partner says that this am she was walking around the house without clothes, talking to herself, stating she was seeing people.  These sxs began to wear off at the time she got here.  She states she sees people that aren't there when she takes Ambien.  Has trouble sleeping when she is pregnant.  She also took her medication for indigestion.  She also reports chewing receipts that are printed out and have ink on them.  She did this with her last pregnancy.  She is here with the God Mother and her boyfriend.  She is coherent, talking without difficulty, without slurred speech at this time.  Denies vaginal bleeding,leaking of fluid.  She feels the baby move.  States the abdominal pain continues rating it a 6/10, pointing to the right side from her breast down the side to her lower back.  Pain is much less when she is lying still, worse with movement.      Past Medical History  Diagnosis Date  . UTI (lower urinary tract infection)   . Hypertension     History reviewed. No pertinent past surgical history.  Family History  Problem Relation Age of Onset  . Asthma Mother   . Hypertension Mother   . Diabetes Maternal Grandmother   . Hypertension Maternal Grandmother   . Stroke Maternal Grandmother   . Anesthesia problems Neg Hx   . Malignant hyperthermia Neg Hx   . Pseudochol  deficiency Neg Hx   . Hypotension Neg Hx     History  Substance Use Topics  . Smoking status: Never Smoker   . Smokeless tobacco: Never Used  . Alcohol Use: No    Allergies:  Allergies  Allergen Reactions  . Amoxicillin Nausea And Vomiting and Other (See Comments)    Tics & twitches  . Percocet (Oxycodone-Acetaminophen) Other (See Comments)    My arm was burning    Prescriptions prior to admission  Medication Sig Dispense Refill  . acetaminophen-codeine (TYLENOL #3) 300-30 MG per tablet Take 1 tablet by mouth every 4 (four) hours as needed. For pain/headache      . ranitidine (ZANTAC) 150 MG tablet Take 150 mg by mouth daily.      Marland Kitchen zolpidem (AMBIEN CR) 12.5 MG CR tablet Take 1 tablet (12.5 mg total) by mouth at bedtime as needed for sleep.  10 tablet  0    Review of Systems  Constitutional: Negative.   HENT: Negative.   Respiratory: Negative.   Cardiovascular: Negative.   Gastrointestinal: Positive for abdominal pain (on the sides).  Genitourinary:       Neg for vaginal bleeding or discharge.  + fetal movement  Psychiatric/Behavioral:       Reaction ? To Ambien.   Physical Exam  Blood pressure 147/80, pulse 132, temperature 98 F (36.7 C), temperature source Oral, resp. rate 16, last menstrual period 10/17/2011, unknown if currently breastfeeding.  Physical Exam  Constitutional: She is oriented to person, place, and time. She appears well-developed and well-nourished. No distress.  HENT:  Head: Normocephalic.  Neck: Normal range of motion.  Respiratory: Effort normal.  GI: Soft. There is tenderness (mild tender in the area of round ligament discomfor). There is no rebound and no guarding.  Genitourinary:       Not indicated  Neurological: She is alert and oriented to person, place, and time.  Skin: Skin is warm and dry.  Psychiatric:       Patient is coherent.  Reporting sxs in order per boyfriend.  She does not have slurred speech or flight of idea.      Results for orders placed during the hospital encounter of 04/12/12 (from the past 24 hour(s))  URINALYSIS, ROUTINE W REFLEX MICROSCOPIC     Status: Abnormal   Collection Time   04/12/12  1:00 PM      Component Value Range   Color, Urine YELLOW  YELLOW   APPearance CLEAR  CLEAR   Specific Gravity, Urine 1.025  1.005 - 1.030   pH 6.5  5.0 - 8.0   Glucose, UA NEGATIVE  NEGATIVE mg/dL   Hgb urine dipstick TRACE (*) NEGATIVE   Bilirubin Urine NEGATIVE  NEGATIVE   Ketones, ur 40 (*) NEGATIVE mg/dL   Protein, ur 30 (*) NEGATIVE mg/dL   Urobilinogen, UA 0.2  0.0 - 1.0 mg/dL   Nitrite NEGATIVE  NEGATIVE   Leukocytes, UA SMALL (*) NEGATIVE  URINE MICROSCOPIC-ADD ON     Status: Abnormal   Collection Time   04/12/12  1:00 PM      Component Value Range   Squamous Epithelial / LPF FEW (*) RARE   WBC, UA 3-6  <3 WBC/hpf   RBC / HPF 0-2  <3 RBC/hpf   Bacteria, UA RARE  RARE   Urine-Other MUCOUS PRESENT     MAU Course  Procedures  UDS pending.   Patient kept adjusting the bands for monitoring.  Got 20 minutes of reassuring strip.  Baseline 145, with 10x10s.    MDM 13:50  Reported MSE to Dr. Gaynell Face.  Order for Urine drug screen, discontinue Ambien use, and use Benadryl 2 tabs prn for sleep.    Assessment and Plan  A:  Confusion probably secondary to Ambien use      Coherent at time of visit      UDS pending      [redacted]w[redacted]d gestation  P:  Patient instructed to discontinue use of Ambien     Dr Gaynell Face wants her to use Benadryl 2 tabs at hs instead and only prn     Follow up in the office as scheduled.    Romario Tith,EVE M 04/12/2012, 1:33 PM

## 2012-04-24 LAB — OB RESULTS CONSOLE RPR: RPR: NONREACTIVE

## 2012-04-25 ENCOUNTER — Inpatient Hospital Stay (HOSPITAL_COMMUNITY)
Admission: AD | Admit: 2012-04-25 | Discharge: 2012-04-25 | Disposition: A | Discharge status: Home / Self Care | Payer: Medicaid Other | Source: Ambulatory Visit | Attending: Obstetrics | Admitting: Obstetrics

## 2012-04-25 ENCOUNTER — Encounter (HOSPITAL_COMMUNITY): Payer: Self-pay | Admitting: Obstetrics and Gynecology

## 2012-04-25 DIAGNOSIS — R42 Dizziness and giddiness: Secondary | ICD-10-CM

## 2012-04-25 DIAGNOSIS — O9989 Other specified diseases and conditions complicating pregnancy, childbirth and the puerperium: Secondary | ICD-10-CM

## 2012-04-25 DIAGNOSIS — D649 Anemia, unspecified: Secondary | ICD-10-CM | POA: Insufficient documentation

## 2012-04-25 DIAGNOSIS — O99891 Other specified diseases and conditions complicating pregnancy: Secondary | ICD-10-CM | POA: Insufficient documentation

## 2012-04-25 DIAGNOSIS — R1032 Left lower quadrant pain: Secondary | ICD-10-CM | POA: Insufficient documentation

## 2012-04-25 DIAGNOSIS — R102 Pelvic and perineal pain: Secondary | ICD-10-CM

## 2012-04-25 DIAGNOSIS — O99019 Anemia complicating pregnancy, unspecified trimester: Secondary | ICD-10-CM | POA: Insufficient documentation

## 2012-04-25 DIAGNOSIS — O99210 Obesity complicating pregnancy, unspecified trimester: Secondary | ICD-10-CM | POA: Insufficient documentation

## 2012-04-25 DIAGNOSIS — E669 Obesity, unspecified: Secondary | ICD-10-CM | POA: Insufficient documentation

## 2012-04-25 DIAGNOSIS — N949 Unspecified condition associated with female genital organs and menstrual cycle: Secondary | ICD-10-CM

## 2012-04-25 DIAGNOSIS — O26899 Other specified pregnancy related conditions, unspecified trimester: Secondary | ICD-10-CM

## 2012-04-25 LAB — URINALYSIS, ROUTINE W REFLEX MICROSCOPIC
Bilirubin Urine: NEGATIVE
Glucose, UA: NEGATIVE mg/dL
Hgb urine dipstick: NEGATIVE
Ketones, ur: 15 mg/dL — AB
Leukocytes, UA: NEGATIVE
Nitrite: NEGATIVE
Protein, ur: NEGATIVE mg/dL
Specific Gravity, Urine: >1.03 — ABNORMAL HIGH (ref 1.005–1.030)
Urobilinogen, UA: 1 mg/dL (ref 0.0–1.0)
pH: 6 (ref 5.0–8.0)

## 2012-04-25 LAB — CBC
HCT: 31.9 % — ABNORMAL LOW (ref 36.0–46.0)
Hemoglobin: 10.4 g/dL — ABNORMAL LOW (ref 12.0–15.0)
MCH: 25.1 pg — ABNORMAL LOW (ref 26.0–34.0)
MCHC: 32.6 g/dL (ref 30.0–36.0)
MCV: 77.1 fL — ABNORMAL LOW (ref 78.0–100.0)
Platelets: 294 10*3/uL (ref 150–400)
RBC: 4.14 MIL/uL (ref 3.87–5.11)
RDW: 15.1 % (ref 11.5–15.5)
WBC: 9.7 10*3/uL (ref 4.0–10.5)

## 2012-04-25 NOTE — MAU Note (Signed)
Pt presents with complaints of back and abdominal pain since last night. States that it feels like the baby is balling up in a ball and kicking hard with his feet. Denies any bleeding or LOF

## 2012-04-25 NOTE — MAU Provider Note (Signed)
Chief Complaint  Patient presents with  . Back Pain  . Abdominal Pain    S: Emma Chambers is a 22 y.o. G2P1001 at 27.3 weeks presenting with several day hx of sharp crampy pain in right and left lower abdomen and groin. Radiates to low back. Pains also occur at rest about every 5 minutes. The pain is exacerbated by walking and changing positions.  She also has dizziness, but not orthostatic. No presyncope.  PNC with Dr. Gaynell Face. Denies complications  Significant ROS: No dysuria, urgency or frequency. She denies contractions, vaginal bleeding or leakage of fluid. Fetus is active. No irritative vaginal discharge.   Significant OB/GYN Hx: NSVD 8 months ago  O: Filed Vitals:   04/25/12 1311  BP: 130/79  Pulse: 122  Temp: 98.1 F (36.7 C)  Resp: 18  Pulse recheck 105-114; no significant orthostatic VS changes  Gen: NAD Abd: soft, mildly tender in lower abd and groin Back: No CVAT SVE:: L/C/H FHR: 130 baseline;  Moderate variability, Accelerations reasuring for GA UCs: none  Results for orders placed during the hospital encounter of 04/25/12 (from the past 24 hour(s))  CBC     Status: Abnormal   Collection Time    04/25/12  2:10 PM      Result Value Range   WBC 9.7  4.0 - 10.5 K/uL   RBC 4.14  3.87 - 5.11 MIL/uL   Hemoglobin 10.4 (*) 12.0 - 15.0 g/dL   HCT 11.9 (*) 14.7 - 82.9 %   MCV 77.1 (*) 78.0 - 100.0 fL   MCH 25.1 (*) 26.0 - 34.0 pg   MCHC 32.6  30.0 - 36.0 g/dL   RDW 56.2  13.0 - 86.5 %   Platelets 294  150 - 400 K/uL  URINALYSIS, ROUTINE W REFLEX MICROSCOPIC     Status: Abnormal   Collection Time    04/25/12  2:34 PM      Result Value Range   Color, Urine YELLOW  YELLOW   APPearance CLEAR  CLEAR   Specific Gravity, Urine >1.030 (*) 1.005 - 1.030   pH 6.0  5.0 - 8.0   Glucose, UA NEGATIVE  NEGATIVE mg/dL   Hgb urine dipstick NEGATIVE  NEGATIVE   Bilirubin Urine NEGATIVE  NEGATIVE   Ketones, ur 15 (*) NEGATIVE mg/dL   Protein, ur NEGATIVE  NEGATIVE mg/dL    Urobilinogen, UA 1.0  0.0 - 1.0 mg/dL   Nitrite NEGATIVE  NEGATIVE   Leukocytes, UA NEGATIVE  NEGATIVE    A:  1. Pain of round ligament complicating pregnancy, antepartum   2. Dizzy   G2P1 at [redacted]w[redacted]d Obese Short interval between pregnancies Borderline anemia  P:  Advised to start taking the PNVs Reassurance given and general relief measures reviewed: avoidance of precipitating movements, instructions on abdominal tightening/pelvic rock exercises, abdominal binder, rest with hip flexion. Follow-up Information   Follow up with Kathreen Cosier, MD. (Keep your regular appointment)    Contact information:   [address] [place] [address] [phone]

## 2012-04-25 NOTE — MAU Note (Signed)
"  Every since I had my sugar test yesterday at the doctor's office, I haven't felt right.  Two days ago, I started throwing up blood.  Today, I'm feeling dizzy, pain in my stomach every 5-10 mins, and pain in my lower back.  The baby feels like he keeps balling up and kicking me in my belly.  (+) FM.  No VB or leaking of fluid from vagina."

## 2012-05-03 ENCOUNTER — Inpatient Hospital Stay (HOSPITAL_COMMUNITY)
Admission: AD | Admit: 2012-05-03 | Discharge: 2012-05-03 | Disposition: A | Discharge status: Home / Self Care | Payer: Medicaid Other | Source: Ambulatory Visit | Attending: Obstetrics | Admitting: Obstetrics

## 2012-05-03 ENCOUNTER — Encounter (HOSPITAL_COMMUNITY): Payer: Self-pay | Admitting: *Deleted

## 2012-05-03 DIAGNOSIS — H6690 Otitis media, unspecified, unspecified ear: Secondary | ICD-10-CM | POA: Insufficient documentation

## 2012-05-03 DIAGNOSIS — R51 Headache: Secondary | ICD-10-CM | POA: Insufficient documentation

## 2012-05-03 DIAGNOSIS — J00 Acute nasopharyngitis [common cold]: Secondary | ICD-10-CM | POA: Insufficient documentation

## 2012-05-03 DIAGNOSIS — O99891 Other specified diseases and conditions complicating pregnancy: Secondary | ICD-10-CM | POA: Insufficient documentation

## 2012-05-03 HISTORY — DX: Gestational (pregnancy-induced) hypertension without significant proteinuria, unspecified trimester: O13.9

## 2012-05-03 HISTORY — DX: Headache: R51

## 2012-05-03 MED ORDER — GUAIFENESIN ER 600 MG PO TB12: 1200.0000 mg | ORAL_TABLET | Freq: Two times a day (BID) | ORAL | Status: DC

## 2012-05-03 MED ORDER — GUAIFENESIN ER 600 MG PO TB12
600.0000 mg | ORAL_TABLET | Freq: Once | ORAL | Status: AC
  Administered 2012-05-03: 600 mg via ORAL
  Filled 2012-05-03: qty 1

## 2012-05-03 MED ORDER — SALINE SPRAY 0.65 % NA SOLN
1.0000 | NASAL | Status: DC | PRN
  Administered 2012-05-03: 1 via NASAL
  Filled 2012-05-03: qty 44

## 2012-05-03 MED ORDER — AMOXICILLIN 250 MG PO CAPS: 250.0000 mg | ORAL_CAPSULE | Freq: Three times a day (TID) | ORAL | Status: DC

## 2012-05-03 MED ORDER — PROMETHAZINE HCL 25 MG PO TABS: 25.0000 mg | ORAL_TABLET | Freq: Four times a day (QID) | ORAL | Status: DC | PRN

## 2012-05-03 MED ORDER — SALINE SPRAY 0.65 % NA SOLN: 1.0000 | NASAL | Status: DC | PRN

## 2012-05-03 NOTE — MAU Note (Signed)
Pt c/o cough, sore throat, occasional headache, and stuffy nose for one week.  Says she vomited once today. No nausea now, Denies any chills/fever or diarrhea.

## 2012-05-03 NOTE — MAU Provider Note (Signed)
History     CSN: [id number]  Arrival date and time: 05/03/12 1935   None     No chief complaint on file.  HPI This is a 22 y.o. female at [redacted]w[redacted]d who presents with c/o cold symptoms for a week. These include nasal stuffiness, sore throat (now gone), and bubbling sounds with pain in right ear. Denies fever but has felt hot and cold.   RN Note: Pt c/o cough, sore throat, occasional headache, and stuffy nose for one week. Says she vomited once today. No nausea now, Denies any chills/fever or diarrhea.       OB History   Grav Para Term Preterm Abortions TAB SAB Ect Mult Living   2 1 1  0 0 0 0 0 0 1      Past Medical History  Diagnosis Date  . UTI (lower urinary tract infection)   . Hypertension     GHTN with last pg  . Pregnancy induced hypertension   . Headache     History reviewed. No pertinent past surgical history.  Family History  Problem Relation Age of Onset  . Asthma Mother   . Hypertension Mother   . Diabetes Maternal Grandmother   . Hypertension Maternal Grandmother   . Stroke Maternal Grandmother   . Anesthesia problems Neg Hx   . Malignant hyperthermia Neg Hx   . Pseudochol deficiency Neg Hx   . Hypotension Neg Hx     History  Substance Use Topics  . Smoking status: Never Smoker   . Smokeless tobacco: Never Used  . Alcohol Use: No    Allergies:  Allergies  Allergen Reactions  . Amoxicillin Nausea And Vomiting and Other (See Comments)    Tics & twitches  . Percocet (Oxycodone-Acetaminophen) Other (See Comments)    My arm was burning    Prescriptions prior to admission  Medication Sig Dispense Refill  . ranitidine (ZANTAC) 150 MG tablet Take 150 mg by mouth daily.        Review of Systems  Constitutional: Positive for malaise/fatigue. Negative for fever and chills.  HENT: Positive for ear pain and congestion. Negative for nosebleeds and sore throat.   Respiratory: Positive for cough and sputum production. Negative for shortness of breath  and wheezing.   Gastrointestinal: Positive for vomiting. Negative for nausea, abdominal pain, diarrhea and constipation.  Genitourinary: Negative for dysuria.  Musculoskeletal: Negative for back pain.  Neurological: Negative for headaches.   Physical Exam   Blood pressure 104/84, pulse 128, temperature 98.1 F (36.7 C), temperature source Oral, resp. rate 18, last menstrual period 10/17/2011, SpO2 100.00%.  Physical Exam  Constitutional: She is oriented to person, place, and time. She appears well-developed and well-nourished. No distress.  HENT:  Head: Normocephalic.  Left Ear: External ear normal.  Right ear with erethema and dullness behind TM  Eyes: Right eye exhibits no discharge. Left eye exhibits no discharge.  Neck: Normal range of motion. Neck supple.  Cardiovascular: Regular rhythm and normal heart sounds.   Slight tachycardia   Respiratory: Effort normal and breath sounds normal. No respiratory distress. She has no wheezes. She has no rales. She exhibits no tenderness.  GI: Soft. She exhibits no distension. There is no tenderness. There is no rebound and no guarding.  Musculoskeletal: Normal range of motion.  Neurological: She is alert and oriented to person, place, and time.  Skin: Skin is warm and dry.  Psychiatric: She has a normal mood and affect.    MAU Course  Procedures  MDM Discussed common cold and possible Otitis Media.   Assessment and Plan  A:  SIUP at [redacted]w[redacted]d       Common Cold      Right otitis media  P:  Discharge home      Supportive care         Medication List    TAKE these medications       amoxicillin 250 MG capsule  Commonly known as:  AMOXIL  Take 1 capsule (250 mg total) by mouth 3 (three) times daily.     guaiFENesin 600 MG 12 hr tablet  Commonly known as:  MUCINEX  Take 2 tablets (1,200 mg total) by mouth 2 (two) times daily.     promethazine 25 MG tablet  Commonly known as:  PHENERGAN  Take 1 tablet (25 mg total) by mouth  every 6 (six) hours as needed for nausea.     ranitidine 150 MG tablet  Commonly known as:  ZANTAC  Take 150 mg by mouth daily.     sodium chloride 0.65 % Soln nasal spray  Commonly known as:  OCEAN  Place 1 spray into the nose as needed for congestion.          Follow up with Dr Gaynell Face.  [facility] 05/03/2012, 8:15 PM

## 2012-05-22 ENCOUNTER — Encounter (HOSPITAL_COMMUNITY): Payer: Self-pay | Admitting: *Deleted

## 2012-05-22 ENCOUNTER — Inpatient Hospital Stay (HOSPITAL_COMMUNITY)
Admission: AD | Admit: 2012-05-22 | Discharge: 2012-05-22 | Disposition: A | Discharge status: Home / Self Care | Payer: Medicaid Other | Source: Ambulatory Visit | Attending: Obstetrics | Admitting: Obstetrics

## 2012-05-22 DIAGNOSIS — O99891 Other specified diseases and conditions complicating pregnancy: Secondary | ICD-10-CM | POA: Insufficient documentation

## 2012-05-22 DIAGNOSIS — R519 Headache, unspecified: Secondary | ICD-10-CM

## 2012-05-22 DIAGNOSIS — R51 Headache: Secondary | ICD-10-CM | POA: Insufficient documentation

## 2012-05-22 DIAGNOSIS — E860 Dehydration: Secondary | ICD-10-CM

## 2012-05-22 DIAGNOSIS — F430 Acute stress reaction: Secondary | ICD-10-CM | POA: Insufficient documentation

## 2012-05-22 DIAGNOSIS — R110 Nausea: Secondary | ICD-10-CM

## 2012-05-22 DIAGNOSIS — Z6379 Other stressful life events affecting family and household: Secondary | ICD-10-CM

## 2012-05-22 LAB — URINALYSIS, ROUTINE W REFLEX MICROSCOPIC
Bilirubin Urine: NEGATIVE
Glucose, UA: NEGATIVE mg/dL
Hgb urine dipstick: NEGATIVE
Ketones, ur: 15 mg/dL — AB
Leukocytes, UA: NEGATIVE
Nitrite: NEGATIVE
Protein, ur: NEGATIVE mg/dL
Specific Gravity, Urine: >1.03 — ABNORMAL HIGH (ref 1.005–1.030)
Urobilinogen, UA: 1 mg/dL (ref 0.0–1.0)
pH: 6.5 (ref 5.0–8.0)

## 2012-05-22 MED ORDER — ONDANSETRON 4 MG PO TBDP
4.0000 mg | ORAL_TABLET | Freq: Once | ORAL | Status: AC
  Administered 2012-05-22: 4 mg via ORAL
  Filled 2012-05-22: qty 1

## 2012-05-22 MED ORDER — PROMETHAZINE HCL 25 MG/ML IJ SOLN
25.0000 mg | INTRAVENOUS | Status: DC
  Administered 2012-05-22: 25 mg via INTRAVENOUS
  Filled 2012-05-22: qty 1

## 2012-05-22 MED ORDER — ONDANSETRON 4 MG PO TBDP: 4.0000 mg | ORAL_TABLET | Freq: Four times a day (QID) | ORAL | Status: DC | PRN

## 2012-05-22 NOTE — MAU Note (Signed)
States she is stressed because her grandmother has been very ill. Was in [place] with her. Was feeling bad so they advised her to come home to [place] where she receives prenatal care. States she is still stressed and has no appetite, cannot eat, feels weak and crampy.

## 2012-05-22 NOTE — MAU Provider Note (Signed)
Chief Complaint:  Lack of appetite    First Provider Initiated Contact with Patient 05/22/12 1524      HPI: Emma Chambers is a 22 y.o. G2P1001 at [redacted]w[redacted]d who presents to maternity admissions reporting that she has been stressed out with her grandmothers illness over the last few days and has had no appetite therefore has eaten very little. Has not eaten at all today. She also complains of nausea, dizziness, headache unresponsive to Tylenol. She also reports feeling contraction pain and lower abdominal pain. Denies leakage of fluid or vaginal bleeding. Good fetal movement.   Pregnancy Course: essentially uncomplicated  Past Medical History: Past Medical History  Diagnosis Date  . UTI (lower urinary tract infection)   . Hypertension     GHTN with last pg  . Pregnancy induced hypertension   . Headache     Past obstetric history: OB History   Grav Para Term Preterm Abortions TAB SAB Ect Mult Living   2 1 1  0 0 0 0 0 0 1     # Outc Date GA Lbr Len/2nd Wgt Sex Del Anes PTL Lv   1 TRM 3/13 [redacted]w[redacted]d 22:02 / 00:26 1.610RU(0AV4.0JW) F SVD EPI  Yes   Comments: none   2 CUR               Past Surgical History: History reviewed. No pertinent past surgical history.  Family History: Family History  Problem Relation Age of Onset  . Asthma Mother   . Hypertension Mother   . Diabetes Maternal Grandmother   . Hypertension Maternal Grandmother   . Stroke Maternal Grandmother   . Anesthesia problems Neg Hx   . Malignant hyperthermia Neg Hx   . Pseudochol deficiency Neg Hx   . Hypotension Neg Hx     Social History: History  Substance Use Topics  . Smoking status: Never Smoker   . Smokeless tobacco: Never Used  . Alcohol Use: No    Allergies:  Allergies  Allergen Reactions  . Amoxicillin Nausea And Vomiting and Other (See Comments)    Tics & twitches  . Percocet (Oxycodone-Acetaminophen) Other (See Comments)    My arm was burning    Meds:  Prescriptions prior to admission   Medication Sig Dispense Refill  . ranitidine (ZANTAC) 150 MG tablet Take 150 mg by mouth 2 (two) times daily.         ROS: Pertinent findings in history of present illness.  Physical Exam  Blood pressure 122/73, pulse 118, temperature 99 F (37.2 C), temperature source Oral, resp. rate 18, height 5\' 1"  (1.549 m), weight 101.606 kg (224 lb), last menstrual period 10/17/2011. GENERAL: Well-developed, well-nourished female in no acute distress.  HEENT: normocephalic HEART: normal rate RESP: normal effort ABDOMEN: Soft, non-tender, gravid appropriate for gestational age EXTREMITIES: Nontender, no edema NEURO: alert and oriented Dilation: Closed Effacement (%): Thick Cervical Position: Posterior Station: Ballotable Exam by:: D. Poe, CNM  FHT:  Baseline 130 , moderate variability, accelerations present, no decelerations Contractions: occ mild with UI   Labs: Results for orders placed during the hospital encounter of 05/22/12 (from the past 24 hour(s))  URINALYSIS, ROUTINE W REFLEX MICROSCOPIC     Status: Abnormal   Collection Time    05/22/12  2:34 PM      Result Value Range   Color, Urine YELLOW  YELLOW   APPearance HAZY (*) CLEAR   Specific Gravity, Urine >1.030 (*) 1.005 - 1.030   pH 6.5  5.0 - 8.0  Glucose, UA NEGATIVE  NEGATIVE mg/dL   Hgb urine dipstick NEGATIVE  NEGATIVE   Bilirubin Urine NEGATIVE  NEGATIVE   Ketones, ur 15 (*) NEGATIVE mg/dL   Protein, ur NEGATIVE  NEGATIVE mg/dL   Urobilinogen, UA 1.0  0.0 - 1.0 mg/dL   Nitrite NEGATIVE  NEGATIVE   Leukocytes, UA NEGATIVE  NEGATIVE   MAU Course: IV LR with Phenergan 25 mg infused Zofran 4 mg ODT given Did not vomit while in MAU 1730: Felt minimally better, still with H/A but declines  analgesic  Assessment: 1. Generalized headaches   2. Dehydration, mild   3. Stress due to illness of family member   G2P1001 at [redacted]w[redacted]d Headache due to not eating/drinking  Plan: Discharge home Stay hydrated, rest  See  AVS   Medication List    TAKE these medications       ondansetron 4 MG disintegrating tablet  Commonly known as:  ZOFRAN ODT  Take 1 tablet (4 mg total) by mouth every 6 (six) hours as needed for nausea.     ranitidine 150 MG tablet  Commonly known as:  ZANTAC  Take 150 mg by mouth 2 (two) times daily.       Follow-up Information   Follow up with MARSHALL,BERNARD A, MD. Schedule an appointment as soon as possible for a visit in 3 days.   Contact information:   [address] [place] [address] [phone]       Danae Orleans, CNM 05/22/2012 3:33 PM

## 2012-05-29 ENCOUNTER — Inpatient Hospital Stay (HOSPITAL_COMMUNITY)
Admission: AD | Admit: 2012-05-29 | Discharge: 2012-05-31 | DRG: 781 | Disposition: A | Discharge status: Home / Self Care | Payer: Medicaid Other | Source: Ambulatory Visit | Attending: Obstetrics | Admitting: Obstetrics

## 2012-05-29 ENCOUNTER — Encounter (HOSPITAL_COMMUNITY): Payer: Self-pay | Admitting: *Deleted

## 2012-05-29 ENCOUNTER — Inpatient Hospital Stay (HOSPITAL_COMMUNITY): Payer: Medicaid Other

## 2012-05-29 DIAGNOSIS — O26619 Liver and biliary tract disorders in pregnancy, unspecified trimester: Secondary | ICD-10-CM | POA: Diagnosis present

## 2012-05-29 DIAGNOSIS — R1084 Generalized abdominal pain: Secondary | ICD-10-CM

## 2012-05-29 DIAGNOSIS — K8020 Calculus of gallbladder without cholecystitis without obstruction: Secondary | ICD-10-CM | POA: Diagnosis present

## 2012-05-29 DIAGNOSIS — R109 Unspecified abdominal pain: Secondary | ICD-10-CM | POA: Diagnosis present

## 2012-05-29 DIAGNOSIS — O368390 Maternal care for abnormalities of the fetal heart rate or rhythm, unspecified trimester, not applicable or unspecified: Secondary | ICD-10-CM | POA: Diagnosis present

## 2012-05-29 DIAGNOSIS — O26899 Other specified pregnancy related conditions, unspecified trimester: Secondary | ICD-10-CM | POA: Diagnosis present

## 2012-05-29 DIAGNOSIS — O4703 False labor before 37 completed weeks of gestation, third trimester: Secondary | ICD-10-CM

## 2012-05-29 DIAGNOSIS — O9989 Other specified diseases and conditions complicating pregnancy, childbirth and the puerperium: Principal | ICD-10-CM | POA: Diagnosis present

## 2012-05-29 DIAGNOSIS — O4700 False labor before 37 completed weeks of gestation, unspecified trimester: Secondary | ICD-10-CM | POA: Diagnosis present

## 2012-05-29 LAB — URINALYSIS, ROUTINE W REFLEX MICROSCOPIC
Bilirubin Urine: NEGATIVE
Glucose, UA: NEGATIVE mg/dL
Hgb urine dipstick: NEGATIVE
Ketones, ur: 15 mg/dL — AB
Leukocytes, UA: NEGATIVE
Nitrite: NEGATIVE
Protein, ur: NEGATIVE mg/dL
Specific Gravity, Urine: 1.025 (ref 1.005–1.030)
Urobilinogen, UA: 0.2 mg/dL (ref 0.0–1.0)
pH: 6.5 (ref 5.0–8.0)

## 2012-05-29 LAB — CBC
HCT: 27.9 % — ABNORMAL LOW (ref 36.0–46.0)
Hemoglobin: 9.1 g/dL — ABNORMAL LOW (ref 12.0–15.0)
MCH: 23.9 pg — ABNORMAL LOW (ref 26.0–34.0)
MCHC: 32.6 g/dL (ref 30.0–36.0)
MCV: 73.4 fL — ABNORMAL LOW (ref 78.0–100.0)
Platelets: 288 10*3/uL (ref 150–400)
RBC: 3.8 MIL/uL — ABNORMAL LOW (ref 3.87–5.11)
RDW: 15.7 % — ABNORMAL HIGH (ref 11.5–15.5)
WBC: 11.8 10*3/uL — ABNORMAL HIGH (ref 4.0–10.5)

## 2012-05-29 LAB — RAPID URINE DRUG SCREEN, HOSP PERFORMED
Amphetamines: POSITIVE — AB
Barbiturates: NOT DETECTED
Benzodiazepines: NOT DETECTED
Cocaine: NOT DETECTED
Opiates: NOT DETECTED
Tetrahydrocannabinol: NOT DETECTED

## 2012-05-29 LAB — FETAL FIBRONECTIN: Fetal Fibronectin: NEGATIVE

## 2012-05-29 LAB — TYPE AND SCREEN
ABO/RH(D): A POS
Antibody Screen: NEGATIVE

## 2012-05-29 MED ORDER — BETAMETHASONE SOD PHOS & ACET 6 (3-3) MG/ML IJ SUSP
12.0000 mg | INTRAMUSCULAR | Status: AC
  Administered 2012-05-29 – 2012-05-30 (×2): 12 mg via INTRAMUSCULAR
  Filled 2012-05-29 (×2): qty 2

## 2012-05-29 MED ORDER — CALCIUM CARBONATE ANTACID 500 MG PO CHEW
2.0000 | CHEWABLE_TABLET | ORAL | Status: DC | PRN
  Administered 2012-05-29: 400 mg via ORAL
  Filled 2012-05-29: qty 2

## 2012-05-29 MED ORDER — LACTATED RINGERS IV BOLUS (SEPSIS)
1000.0000 mL | Freq: Once | INTRAVENOUS | Status: AC
  Administered 2012-05-29: 1000 mL via INTRAVENOUS

## 2012-05-29 MED ORDER — MAGNESIUM SULFATE BOLUS VIA INFUSION
4.0000 g | Freq: Once | INTRAVENOUS | Status: AC
  Administered 2012-05-29: 4 g via INTRAVENOUS
  Filled 2012-05-29: qty 500

## 2012-05-29 MED ORDER — ZOLPIDEM TARTRATE 5 MG PO TABS
5.0000 mg | ORAL_TABLET | Freq: Every evening | ORAL | Status: DC | PRN
  Filled 2012-05-29: qty 1

## 2012-05-29 MED ORDER — DOCUSATE SODIUM 100 MG PO CAPS
100.0000 mg | ORAL_CAPSULE | Freq: Every day | ORAL | Status: DC
  Administered 2012-05-29 – 2012-05-31 (×3): 100 mg via ORAL
  Filled 2012-05-29 (×5): qty 1

## 2012-05-29 MED ORDER — ONDANSETRON HCL 4 MG/2ML IJ SOLN
4.0000 mg | Freq: Four times a day (QID) | INTRAMUSCULAR | Status: DC | PRN
  Administered 2012-05-29 – 2012-05-30 (×2): 4 mg via INTRAVENOUS
  Filled 2012-05-29 (×3): qty 2

## 2012-05-29 MED ORDER — NALBUPHINE HCL 10 MG/ML IJ SOLN
10.0000 mg | INTRAMUSCULAR | Status: AC
  Administered 2012-05-29: 10 mg via INTRAVENOUS
  Filled 2012-05-29: qty 1

## 2012-05-29 MED ORDER — LACTATED RINGERS IV SOLN
INTRAVENOUS | Status: DC
  Administered 2012-05-29 – 2012-05-30 (×2): via INTRAVENOUS

## 2012-05-29 MED ORDER — HYDROCODONE-ACETAMINOPHEN 5-325 MG PO TABS
2.0000 | ORAL_TABLET | ORAL | Status: DC | PRN
  Administered 2012-05-29: 2 via ORAL
  Administered 2012-05-31: 1 via ORAL
  Filled 2012-05-29: qty 2
  Filled 2012-05-29: qty 1
  Filled 2012-05-29: qty 2

## 2012-05-29 MED ORDER — PRENATAL MULTIVITAMIN CH: 1.0000 | ORAL_TABLET | Freq: Every day | ORAL | Status: DC

## 2012-05-29 MED ORDER — MAGNESIUM SULFATE 40 G IN LACTATED RINGERS - SIMPLE
2.0000 g/h | INTRAVENOUS | Status: DC
  Filled 2012-05-29: qty 500

## 2012-05-29 MED ORDER — FAMOTIDINE 20 MG PO TABS
20.0000 mg | ORAL_TABLET | Freq: Two times a day (BID) | ORAL | Status: DC
  Administered 2012-05-30 – 2012-05-31 (×4): 20 mg via ORAL
  Filled 2012-05-29 (×5): qty 1

## 2012-05-29 NOTE — MAU Note (Signed)
Pt C/O urinary frequency but denies dysuria.

## 2012-05-29 NOTE — Progress Notes (Signed)
Called provider to report nausea.  Orders recieved

## 2012-05-29 NOTE — MAU Note (Signed)
Pt states she has an appt with the clinic in April, does not want to see Dr. Gaynell Face anymore.

## 2012-05-29 NOTE — MAU Provider Note (Signed)
Chief Complaint:  Contractions   First Provider Initiated Contact with Patient 05/29/12 1130      HPI: Emma Chambers is a 22 y.o. G2P1001 at [redacted]w[redacted]d who presents to maternity admissions reporting abdominal pain described as constant, but sometimes getting stronger, mostly in lower abdomen and radiating to right side of her back.  She is writhing on bed and unable to remain still when arriving in MAU.  She reports good fetal movement, denies LOF, vaginal bleeding, vaginal itching/burning, urinary symptoms, h/a, dizziness, n/v, or fever/chills.     Past Medical History: Past Medical History  Diagnosis Date  . UTI (lower urinary tract infection)   . Hypertension     GHTN with last pg  . Pregnancy induced hypertension   . Headache     Past obstetric history: OB History   Grav Para Term Preterm Abortions TAB SAB Ect Mult Living   2 1 1  0 0 0 0 0 0 1     # Outc Date GA Lbr Len/2nd Wgt Sex Del Anes PTL Lv   1 TRM 3/13 [redacted]w[redacted]d 22:02 / 00:26 1.610RU(0AV4.0JW) F SVD EPI  Yes   Comments: none   2 CUR               Past Surgical History: History reviewed. No pertinent past surgical history.  Family History: Family History  Problem Relation Age of Onset  . Asthma Mother   . Hypertension Mother   . Diabetes Maternal Grandmother   . Hypertension Maternal Grandmother   . Stroke Maternal Grandmother   . Anesthesia problems Neg Hx   . Malignant hyperthermia Neg Hx   . Pseudochol deficiency Neg Hx   . Hypotension Neg Hx     Social History: History  Substance Use Topics  . Smoking status: Never Smoker   . Smokeless tobacco: Never Used  . Alcohol Use: No    Allergies:  Allergies  Allergen Reactions  . Amoxicillin Nausea And Vomiting and Other (See Comments)    Tics & twitches  . Percocet (Oxycodone-Acetaminophen) Other (See Comments)    My arm was burning    Meds:  Prescriptions prior to admission  Medication Sig Dispense Refill  . ranitidine (ZANTAC) 150 MG tablet Take  150 mg by mouth 2 (two) times daily.         ROS: Pertinent findings in history of present illness.  Physical Exam  Blood pressure 108/71, pulse 112, temperature 98.9 F (37.2 C), temperature source Oral, resp. rate 20, last menstrual period 10/17/2011. GENERAL: Well-developed, well-nourished female in no acute distress.  HEENT: normocephalic HEART: normal rate RESP: normal effort ABDOMEN: Soft, non-tender, gravid appropriate for gestational age BACK: Negative CVA tenderness EXTREMITIES: Nontender, no edema NEURO: alert and oriented SPECULUM EXAM: NEFG, physiologic discharge, no blood, cervix clean  FFN collected  Dilation: 3 Effacement (%): 40 Cervical Position: Anterior Station: -3 Presentation: Vertex Exam by:: L. Leftwich-Kirby CNM  FHT:  Baseline 145 , moderate variability, accelerations present, no decelerations, baseline change to 130 following dose of Nubain Contractions: None on monitor or to palpation   Labs: Results for orders placed during the hospital encounter of 05/29/12 (from the past 24 hour(s))  FETAL FIBRONECTIN     Status: None   Collection Time    05/29/12 11:20 AM      Result Value Range   Fetal Fibronectin NEGATIVE  NEGATIVE  URINALYSIS, ROUTINE W REFLEX MICROSCOPIC     Status: Abnormal   Collection Time    05/29/12 11:25 AM  Result Value Range   Color, Urine YELLOW  YELLOW   APPearance HAZY (*) CLEAR   Specific Gravity, Urine 1.025  1.005 - 1.030   pH 6.5  5.0 - 8.0   Glucose, UA NEGATIVE  NEGATIVE mg/dL   Hgb urine dipstick NEGATIVE  NEGATIVE   Bilirubin Urine NEGATIVE  NEGATIVE   Ketones, ur 15 (*) NEGATIVE mg/dL   Protein, ur NEGATIVE  NEGATIVE mg/dL   Urobilinogen, UA 0.2  0.0 - 1.0 mg/dL   Nitrite NEGATIVE  NEGATIVE   Leukocytes, UA NEGATIVE  NEGATIVE  CBC     Status: Abnormal   Collection Time    05/29/12  2:47 PM      Result Value Range   WBC 11.8 (*) 4.0 - 10.5 K/uL   RBC 3.80 (*) 3.87 - 5.11 MIL/uL   Hemoglobin 9.1 (*)  12.0 - 15.0 g/dL   HCT 16.1 (*) 09.6 - 04.5 %   MCV 73.4 (*) 78.0 - 100.0 fL   MCH 23.9 (*) 26.0 - 34.0 pg   MCHC 32.6  30.0 - 36.0 g/dL   RDW 40.9 (*) 81.1 - 91.4 %   Platelets 288  150 - 400 K/uL   Imaging:  Assessment: 1. Threatened preterm labor, third trimester   2. Pregnancy with abdominal pain of right lower quadrant, antepartum     Plan: Called Dr Tamela Oddi to discuss assessment and findings Admit to antepartum for steroids, magnesium, monitoring    Medication List    ASK your doctor about these medications       ranitidine 150 MG tablet  Commonly known as:  ZANTAC  Take 150 mg by mouth 2 (two) times daily.        Sharen Counter Certified Nurse-Midwife 05/29/2012 1:35 PM

## 2012-05-29 NOTE — Progress Notes (Signed)
Pt c/o nausea and dizziness during [place].  Pt. respositioned to left lateral decubitus position and reported resolution of nausea.  Dizziness persisted. Pt. Transported to MAU via wheelchair.

## 2012-05-29 NOTE — MAU Note (Signed)
Back pain started last night, began having uc's around 2000.  uc's have become progressively worse.  Pt denies bleeding or LOF.

## 2012-05-29 NOTE — H&P (Signed)
Chief Complaint: Contractions    HPI: Emma Chambers is a 22 y.o. G2P1001 at [redacted]w[redacted]d who presents to maternity admissions reporting abdominal pain described as constant, but sometimes getting stronger, mostly in lower abdomen and radiating to right side of her back. She is writhing on bed and unable to remain still when arriving in MAU. She reports good fetal movement, denies LOF, vaginal bleeding, vaginal itching/burning, urinary symptoms, h/a, dizziness, n/v, or fever/chills.  Past Medical History:  Past Medical History   Diagnosis  Date   .  UTI (lower urinary tract infection)    .  Hypertension      GHTN with last pg   .  Pregnancy induced hypertension    .  Headache     Past obstetric history:  OB History    Grav  Para  Term  Preterm  Abortions  TAB  SAB  Ect  Mult  Living    2  1  1   0  0  0  0  0  0  1      #  Outc  Date  GA  Lbr Len/2nd  Wgt  Sex  Del  Anes  PTL  Lv    1  TRM  3/13  104w3d  22:02 / 00:26  4.098JX(9JY7.8GN)  F  SVD  EPI   Yes    Comments: none    2  CUR               Past Surgical History:  History reviewed. No pertinent past surgical history.  Family History:  Family History   Problem  Relation  Age of Onset   .  Asthma  Mother    .  Hypertension  Mother    .  Diabetes  Maternal Grandmother    .  Hypertension  Maternal Grandmother    .  Stroke  Maternal Grandmother    .  Anesthesia problems  Neg Hx    .  Malignant hyperthermia  Neg Hx    .  Pseudochol deficiency  Neg Hx    .  Hypotension  Neg Hx     Social History:  History   Substance Use Topics   .  Smoking status:  Never Smoker   .  Smokeless tobacco:  Never Used   .  Alcohol Use:  No    Allergies:  Allergies   Allergen  Reactions   .  Amoxicillin  Nausea And Vomiting and Other (See Comments)     Tics & twitches   .  Percocet (Oxycodone-Acetaminophen)  Other (See Comments)     My arm was burning    Meds:  Prescriptions prior to admission   Medication  Sig  Dispense  Refill   .   ranitidine (ZANTAC) 150 MG tablet  Take 150 mg by mouth 2 (two) times daily.      ROS: Pertinent findings in history of present illness.  Physical Exam   Blood pressure 108/71, pulse 112, temperature 98.9 F (37.2 C), temperature source Oral, resp. rate 20, last menstrual period 10/17/2011.  GENERAL: Well-developed, well-nourished female in no acute distress.  HEENT: normocephalic  HEART: normal rate  RESP: normal effort  ABDOMEN: Soft, non-tender, gravid appropriate for gestational age  BACK: Negative CVA tenderness  EXTREMITIES: Nontender, no edema  NEURO: alert and oriented  SPECULUM EXAM: NEFG, physiologic discharge, no blood, cervix clean  FFN collected  Dilation: 3  Effacement (%): 40  Cervical Position: Anterior  Station: -3  Presentation:  Vertex  Exam by:: L. Leftwich-Kirby CNM  FHT: Baseline 145 , moderate variability, accelerations present, no decelerations, baseline change to 130 following dose of Nubain  Contractions: None on monitor or to palpation  Labs:  Results for orders placed during the hospital encounter of 05/29/12 (from the past 24 hour(s))   FETAL FIBRONECTIN Status: None    Collection Time    05/29/12 11:20 AM   Result  Value  Range    Fetal Fibronectin  NEGATIVE  NEGATIVE   URINALYSIS, ROUTINE W REFLEX MICROSCOPIC Status: Abnormal    Collection Time    05/29/12 11:25 AM   Result  Value  Range    Color, Urine  YELLOW  YELLOW    APPearance  HAZY (*)  CLEAR    Specific Gravity, Urine  1.025  1.005 - 1.030    pH  6.5  5.0 - 8.0    Glucose, UA  NEGATIVE  NEGATIVE mg/dL    Hgb urine dipstick  NEGATIVE  NEGATIVE    Bilirubin Urine  NEGATIVE  NEGATIVE    Ketones, ur  15 (*)  NEGATIVE mg/dL    Protein, ur  NEGATIVE  NEGATIVE mg/dL    Urobilinogen, UA  0.2  0.0 - 1.0 mg/dL    Nitrite  NEGATIVE  NEGATIVE    Leukocytes, UA  NEGATIVE  NEGATIVE   CBC Status: Abnormal    Collection Time    05/29/12 2:47 PM   Result  Value  Range    WBC  11.8 (*)  4.0 -  10.5 K/uL    RBC  3.80 (*)  3.87 - 5.11 MIL/uL    Hemoglobin  9.1 (*)  12.0 - 15.0 g/dL    HCT  14.7 (*)  82.9 - 46.0 %    MCV  73.4 (*)  78.0 - 100.0 fL    MCH  23.9 (*)  26.0 - 34.0 pg    MCHC  32.6  30.0 - 36.0 g/dL    RDW  56.2 (*)  13.0 - 15.5 %    Platelets  288  150 - 400 K/uL    Imaging:   Assessment:  1.  Threatened preterm labor, third trimester   2.  Pregnancy with abdominal pain of right lower quadrant, antepartum    Plan:   Admit to antepartum for steroids, magnesium, monitoring

## 2012-05-29 NOTE — MAU Note (Signed)
Pt returned from U/S, states she felt weak, dizzy, & nauseous in U/S because they were having to press very hard during the scan.  Pt states she feels better now, BP 113/52, HR 106.

## 2012-05-29 NOTE — Progress Notes (Signed)
Intermittent tracing, pt moving frequently.  Encouraged pt to try to stay in one position so we can monitor baby & ? uc's.

## 2012-05-30 ENCOUNTER — Inpatient Hospital Stay (HOSPITAL_COMMUNITY): Payer: Medicaid Other

## 2012-05-30 DIAGNOSIS — R1084 Generalized abdominal pain: Secondary | ICD-10-CM | POA: Diagnosis present

## 2012-05-30 DIAGNOSIS — O26899 Other specified pregnancy related conditions, unspecified trimester: Secondary | ICD-10-CM | POA: Diagnosis present

## 2012-05-30 MED ORDER — SODIUM CHLORIDE 0.9 % IJ SOLN: 3.0000 mL | Freq: Two times a day (BID) | INTRAMUSCULAR | Status: DC

## 2012-05-30 MED ORDER — SODIUM CHLORIDE 0.9 % IV SOLN: 250.0000 mL | INTRAVENOUS | Status: DC | PRN

## 2012-05-30 MED ORDER — SODIUM CHLORIDE 0.9 % IJ SOLN: 3.0000 mL | INTRAMUSCULAR | Status: DC | PRN

## 2012-05-30 MED ORDER — BUTORPHANOL TARTRATE 1 MG/ML IJ SOLN
2.0000 mg | INTRAMUSCULAR | Status: DC | PRN
  Administered 2012-05-30 (×2): 2 mg via INTRAVENOUS
  Filled 2012-05-30 (×2): qty 2

## 2012-05-30 NOTE — Progress Notes (Signed)
1112 while waiting on stadol to show up in pxyis, pt walked to bathroom and voided, and had bm but states she feels like the baby is coming and had to push. Pt assisted to bed.

## 2012-05-30 NOTE — Progress Notes (Signed)
Called dr Tamela Oddi, informed pt requesting to be discharged home, pt may be discharged home tomorrow.

## 2012-05-30 NOTE — Progress Notes (Signed)
1057 dr Tamela Oddi called and and updated on pt status and pt c/o pain-orders received.

## 2012-05-30 NOTE — Progress Notes (Signed)
Pt asking of she is able to be discharged home tonight, will call dr Tamela Oddi to get POC.

## 2012-05-30 NOTE — Progress Notes (Signed)
Pt c/o nausea such that she could not eat the breakfast she was ordering. Offered zofran, pt agreed. Zofran pulled and top removed. Then [facility] showed pt received at [facility] and pt notified she could not receive until after 1222. Pt reluctant but agrees. Zofran placed and locked in pt med cart.

## 2012-05-30 NOTE — Progress Notes (Signed)
Pt getting up to shower 

## 2012-05-30 NOTE — Progress Notes (Signed)
1244 pt returned from [place] and offered her zofran. Pt visiting and laughing with family and states she would not like the zofran.

## 2012-05-30 NOTE — Progress Notes (Signed)
Emma Chambers in pharmacy notified (pharmacist) about pt declining zofran after top had been removed from vial and dose placed in black box per protocol.

## 2012-05-30 NOTE — Progress Notes (Signed)
1050 pt called out c/o right sided pain that was burning and stinging down into her vagina that felt just as it had when she came into the ER. Pt is gripping the side rails and rubbing her legs together. The uterus paplates soft. paplated for several minutes. Pt reassured.

## 2012-05-30 NOTE — Progress Notes (Signed)
Patient ID: Emma Chambers, female   DOB: Jul 26, 1990, 22 y.o.   MRN: [id number] Hospital Day: 2  S: Preterm labor symptoms: No complaints  O: Blood pressure 128/62, pulse 111, temperature 98.6 F (37 C), temperature source Oral, resp. rate 20, height 5\' 1"  (1.549 m), weight 205 lb (92.987 kg), last menstrual period 10/17/2011, SpO2 100.00%.   ION:GEXBMWUX: 140 bpm, Variability: Good {> 6 bpm), Accelerations: Non-reactive but appropriate for gestational age and Decelerations: Absent Toco: None LKG:MWNUUVOZ: 2.5 Effacement (%): 60 Cervical Position: Posterior Station: -3 Presentation: Vertex Exam by:: RZHANG,rnc-ob  A/P- 22 y.o. admitted with:  Present on Admission:  . Pregnancy with generalized abdominal pain, antepartum   Preterm labor management: on magnesium sulfate for tocolysis Dating:  [redacted]w[redacted]d  FWB:  Consistent with fetal well-being PTL:  Minimal uterine activity Continue to monitor symptoms

## 2012-05-31 ENCOUNTER — Inpatient Hospital Stay (HOSPITAL_COMMUNITY): Payer: Medicaid Other

## 2012-05-31 DIAGNOSIS — K8020 Calculus of gallbladder without cholecystitis without obstruction: Secondary | ICD-10-CM | POA: Diagnosis present

## 2012-05-31 LAB — COMPREHENSIVE METABOLIC PANEL
ALT: 5 U/L (ref 0–35)
AST: 12 U/L (ref 0–37)
Albumin: 2.7 g/dL — ABNORMAL LOW (ref 3.5–5.2)
Alkaline Phosphatase: 125 U/L — ABNORMAL HIGH (ref 39–117)
BUN: 4 mg/dL — ABNORMAL LOW (ref 6–23)
CO2: 20 mEq/L (ref 19–32)
Calcium: 8.9 mg/dL (ref 8.4–10.5)
Chloride: 105 mEq/L (ref 96–112)
Creatinine, Ser: 0.45 mg/dL — ABNORMAL LOW (ref 0.50–1.10)
GFR calc Af Amer: >90 mL/min (ref >90)
GFR calc non Af Amer: >90 mL/min (ref >90)
Glucose, Bld: 89 mg/dL (ref 70–99)
Potassium: 3.9 mEq/L (ref 3.5–5.1)
Sodium: 136 mEq/L (ref 135–145)
Total Bilirubin: 0.2 mg/dL — ABNORMAL LOW (ref 0.3–1.2)
Total Protein: 6.5 g/dL (ref 6.0–8.3)

## 2012-05-31 LAB — LIPASE, BLOOD: Lipase: 23 U/L (ref 11–59)

## 2012-05-31 MED ORDER — HYDROCODONE-ACETAMINOPHEN 5-325 MG PO TABS: 2.0000 | ORAL_TABLET | Freq: Four times a day (QID) | ORAL | Status: DC | PRN

## 2012-05-31 NOTE — Research (Signed)
Pt called out stating IV site was itching and bandage was coming off, while trying to redress IV site, IV came out, will wait to see if pt is discharged today before starting new IV

## 2012-05-31 NOTE — Progress Notes (Signed)
Patient ID: Emma Chambers, female   DOB: 09-19-1990, 22 y.o.   MRN: [id number] Hospital Day: 3  S: Preterm labor symptoms: diffuse abdominal pain, mild  O: Blood pressure 126/66, pulse 101, temperature 98.7 F (37.1 C), temperature source Oral, resp. rate 20, height 5\' 1"  (1.549 m), weight 205 lb (92.987 kg), last menstrual period 10/17/2011, SpO2 100.00%.   WUJ:WJXBJYNW: 140 bpm, Variability: Good {> 6 bpm), Accelerations: Non-reactive but appropriate for gestational age and Decelerations: Absent Toco: None GNF:AOZHYQMV: 2.5 Effacement (%): 60 Cervical Position: Posterior Station: -3 Presentation: Vertex Exam by:: RZHANG,rnc-ob    US Renal  05/30/2012  *RADIOLOGY REPORT*  Clinical Data: Right flank pain and burning.  The patient is [redacted] weeks pregnant.  RENAL/URINARY TRACT ULTRASOUND COMPLETE  Comparison:  None  Findings:  Right Kidney:   12.7 cm in length.  No mass or hydronephrosis.  Left Kidney:  12.0 cm in length.  No mass or hydronephrosis.  Bladder:  Normal in appearance.  Bilateral ureteral jets are visualized.  IMPRESSION: Normal renal ultrasound.   Original Report Authenticated By: Norva Pavlov, M.D.     A/P- 22 y.o. admitted with:  Present on Admission:  . Pregnancy with generalized abdominal pain, antepartum ?etiology, doubt pregnancy-related   Preterm labor management: bedrest Dating: [redacted]w[redacted]d   FWB:  Consistent with fetal well-being PTL:  Minimal uterine activity Check CMET, abdominal U/S Likely d/c home later today

## 2012-05-31 NOTE — Discharge Summary (Signed)
  Physician Discharge Summary  Patient ID: Emma Chambers MRN: [id number] DOB/AGE: [birth year] 21 y.o.  Admit date: 05/29/2012 Discharge date: 05/31/2012  Admission Diagnoses: Active Problems:   Pregnancy with generalized abdominal pain, antepartum   Cholelithiasis complicating pregnancy, antepartum  Discharge Diagnoses:  Active Problems:   Pregnancy with generalized abdominal pain, antepartum   Cholelithiasis complicating pregnancy, antepartum   Discharged Condition: stable  Hospital Course: The patient's complaints gradually improved.  An abdominal U/S showed a normal appearing placenta.  On tocodynamometer there was minimal uterine activity.  A renal U/S was normal.  Laboratory studies were normal.  An abdominal U/S was remarkable for cholelithiasis without wall thickening.  Consults: None  Significant Diagnostic Studies: see above  Treatments: analgesia: Vicodin  Discharge Exam: Blood pressure 100/39, pulse 106, temperature 98.8 F (37.1 C), temperature source Oral, resp. rate 20, height 5\' 1"  (1.549 m), weight 205 lb (92.987 kg), last menstrual period 10/17/2011, SpO2 100.00%. General appearance: alert GI: soft, non-tender; bowel sounds normal; no masses,  no organomegaly Extremities: extremities normal, atraumatic, no cyanosis or edema  Disposition: 01-Home or Self Care  Discharge Orders   Future Orders Complete By Expires     Discharge diet:  As directed     Comments:      Low fat    Notify physician for a general feeling that "something is not right"  As directed     Notify physician for increase or change in vaginal discharge  As directed     Notify physician for intestinal cramps, with or without diarrhea, sometimes described as "gas pain"  As directed     Notify physician for leaking of fluid  As directed     Notify physician for low, dull backache, unrelieved by heat or Tylenol  As directed     Notify physician for menstrual like cramps  As directed     Notify  physician for pelvic pressure  As directed     Notify physician for uterine contractions.  These may be painless and feel like the uterus is tightening or the baby is  "balling up"  As directed     Notify physician for vaginal bleeding  As directed     PRETERM LABOR:  Includes any of the follwing symptoms that occur between 20 - [redacted] weeks gestation.  If these symptoms are not stopped, preterm labor can result in preterm delivery, placing your baby at risk  As directed         Medication List    TAKE these medications       HYDROcodone-acetaminophen 5-325 MG per tablet  Commonly known as:  NORCO/VICODIN  Take 2 tablets by mouth every 6 (six) hours as needed.     ranitidine 150 MG tablet  Commonly known as:  ZANTAC  Take 150 mg by mouth 2 (two) times daily.           Follow-up Information   Follow up with MARSHALL,BERNARD A, MD. Call in 1 week. (Call to schedule)    Contact information:   [address] [place] [address] [phone]       Signed: Roseanna Rainbow 05/31/2012, 8:55 PM

## 2012-05-31 NOTE — Progress Notes (Signed)
1120 entered room w/dr Tamela Oddi and advised pt to remain NPO for [place] of abdomen. Pt states she last ate at 0700. Pt asked about positive UDS. Pt states she has no idea what it could be from. She has only taken zantac, nose spray and an antibiotic for an ear infection.

## 2012-06-02 ENCOUNTER — Inpatient Hospital Stay (HOSPITAL_COMMUNITY)
Admission: AD | Admit: 2012-06-02 | Discharge: 2012-06-02 | Disposition: A | Discharge status: Home / Self Care | Payer: Medicaid Other | Source: Ambulatory Visit | Attending: Obstetrics & Gynecology | Admitting: Obstetrics & Gynecology

## 2012-06-02 ENCOUNTER — Encounter (HOSPITAL_COMMUNITY): Payer: Self-pay | Admitting: *Deleted

## 2012-06-02 DIAGNOSIS — K8020 Calculus of gallbladder without cholecystitis without obstruction: Secondary | ICD-10-CM

## 2012-06-02 DIAGNOSIS — O2390 Unspecified genitourinary tract infection in pregnancy, unspecified trimester: Secondary | ICD-10-CM | POA: Insufficient documentation

## 2012-06-02 DIAGNOSIS — O2343 Unspecified infection of urinary tract in pregnancy, third trimester: Secondary | ICD-10-CM

## 2012-06-02 DIAGNOSIS — N390 Urinary tract infection, site not specified: Secondary | ICD-10-CM | POA: Insufficient documentation

## 2012-06-02 DIAGNOSIS — R1031 Right lower quadrant pain: Secondary | ICD-10-CM | POA: Insufficient documentation

## 2012-06-02 DIAGNOSIS — R1011 Right upper quadrant pain: Secondary | ICD-10-CM | POA: Insufficient documentation

## 2012-06-02 LAB — COMPREHENSIVE METABOLIC PANEL
ALT: 6 U/L (ref 0–35)
AST: 12 U/L (ref 0–37)
Albumin: 2.7 g/dL — ABNORMAL LOW (ref 3.5–5.2)
Alkaline Phosphatase: 131 U/L — ABNORMAL HIGH (ref 39–117)
BUN: 4 mg/dL — ABNORMAL LOW (ref 6–23)
CO2: 21 mEq/L (ref 19–32)
Calcium: 8.7 mg/dL (ref 8.4–10.5)
Chloride: 101 mEq/L (ref 96–112)
Creatinine, Ser: 0.43 mg/dL — ABNORMAL LOW (ref 0.50–1.10)
GFR calc Af Amer: >90 mL/min (ref >90)
GFR calc non Af Amer: >90 mL/min (ref >90)
Glucose, Bld: 91 mg/dL (ref 70–99)
Potassium: 3.1 mEq/L — ABNORMAL LOW (ref 3.5–5.1)
Sodium: 134 mEq/L — ABNORMAL LOW (ref 135–145)
Total Bilirubin: 0.2 mg/dL — ABNORMAL LOW (ref 0.3–1.2)
Total Protein: 6.4 g/dL (ref 6.0–8.3)

## 2012-06-02 LAB — CBC
HCT: 29.7 % — ABNORMAL LOW (ref 36.0–46.0)
Hemoglobin: 9.6 g/dL — ABNORMAL LOW (ref 12.0–15.0)
MCH: 23.9 pg — ABNORMAL LOW (ref 26.0–34.0)
MCHC: 32.3 g/dL (ref 30.0–36.0)
MCV: 73.9 fL — ABNORMAL LOW (ref 78.0–100.0)
Platelets: 286 10*3/uL (ref 150–400)
RBC: 4.02 MIL/uL (ref 3.87–5.11)
RDW: 15.2 % (ref 11.5–15.5)
WBC: 16 10*3/uL — ABNORMAL HIGH (ref 4.0–10.5)

## 2012-06-02 LAB — URINE MICROSCOPIC-ADD ON

## 2012-06-02 LAB — URINALYSIS, ROUTINE W REFLEX MICROSCOPIC
Bilirubin Urine: NEGATIVE
Glucose, UA: NEGATIVE mg/dL
Ketones, ur: NEGATIVE mg/dL
Nitrite: NEGATIVE
Protein, ur: NEGATIVE mg/dL
Specific Gravity, Urine: 1.02 (ref 1.005–1.030)
Urobilinogen, UA: 1 mg/dL (ref 0.0–1.0)
pH: 6.5 (ref 5.0–8.0)

## 2012-06-02 MED ORDER — HYDROCODONE-ACETAMINOPHEN 5-325 MG PO TABS
2.0000 | ORAL_TABLET | Freq: Once | ORAL | Status: AC
  Administered 2012-06-02: 2 via ORAL
  Filled 2012-06-02: qty 2

## 2012-06-02 MED ORDER — CEPHALEXIN 500 MG PO CAPS: 500.0000 mg | ORAL_CAPSULE | Freq: Four times a day (QID) | ORAL | Status: AC

## 2012-06-02 MED ORDER — OXYCODONE-ACETAMINOPHEN 5-325 MG PO TABS: 1.0000 | ORAL_TABLET | ORAL | Status: DC | PRN

## 2012-06-02 NOTE — MAU Note (Signed)
Was admitted over the weekend with gallstones. Pain is side continues.  Has headache since Sunday, tried Tylenol- no relief.

## 2012-06-02 NOTE — MAU Provider Note (Signed)
History     CSN: [id number]  Arrival date and time: 06/02/12 1130   First Provider Initiated Contact with Patient 06/02/12 1348      Chief Complaint  Patient presents with  . Abdominal Pain  . Headache   HPI  Pt is a G33 GA [redacted]w[redacted]d female who presents for R-sided abdominal pain. She was admitted here 4 days ago for the same complaint and was found to have cholelithiasis via ultrasound. She was discharged with pain control and low-fat discharge instructions. She reports that the pain has increased since her discharge. Her pain is located between her RLQ and RUQ and is described as sharp and radiating to her lower back. Of note, the pt reports eating things such as pizza and a sausage biscuit from [employer] since her discharge. She is also reporting increased urination, saying that she feels like she has to go all the time. She denies any dysuria but says she is experiencing a "tingling" feeling when she urinates. She denies any vaginal bleeding or loss of fluid.  She was a pt of Dr Elsie Stain but no longer wants to see him and has an appointment with the [facility] on 3/27.   Past Medical History  Diagnosis Date  . UTI (lower urinary tract infection)   . Hypertension     GHTN with last pg  . Pregnancy induced hypertension   . Headache     History reviewed. No pertinent past surgical history.  Family History  Problem Relation Age of Onset  . Asthma Mother   . Hypertension Mother   . Diabetes Maternal Grandmother   . Hypertension Maternal Grandmother   . Stroke Maternal Grandmother   . Anesthesia problems Neg Hx   . Malignant hyperthermia Neg Hx   . Pseudochol deficiency Neg Hx   . Hypotension Neg Hx     History  Substance Use Topics  . Smoking status: Never Smoker   . Smokeless tobacco: Never Used  . Alcohol Use: No    Allergies:  Allergies  Allergen Reactions  . Amoxicillin Nausea And Vomiting and Other (See Comments)    Tics & twitches  . Percocet  (Oxycodone-Acetaminophen) Other (See Comments)    My arm was burning    Prescriptions prior to admission  Medication Sig Dispense Refill  . HYDROcodone-acetaminophen (NORCO/VICODIN) 5-325 MG per tablet Take 2 tablets by mouth every 6 (six) hours as needed.  30 tablet  0  . ranitidine (ZANTAC) 150 MG tablet Take 150 mg by mouth 2 (two) times daily.         ROS A 14-point review of systems was asked and positive pertinent and negatives are discussed in the HPI.  Physical Exam   Blood pressure 99/55, pulse 124, temperature 99.2 F (37.3 C), temperature source Oral, resp. rate 24, last menstrual period 10/17/2011.  Physical Exam  Constitutional: She appears well-developed and well-nourished. No distress.  Cardiovascular: Normal rate.   Respiratory: Effort normal and breath sounds normal.  GI: Soft. Normal appearance. There is tenderness in the right upper quadrant, right lower quadrant and suprapubic area. There is no CVA tenderness.  Musculoskeletal: She exhibits no edema.  Skin: Skin is warm and dry.   Results for orders placed during the hospital encounter of 06/02/12 (from the past 24 hour(s))  CBC     Status: Abnormal   Collection Time    06/02/12  1:27 PM      Result Value Range   WBC 16.0 (*) 4.0 - 10.5 K/uL  RBC 4.02  3.87 - 5.11 MIL/uL   Hemoglobin 9.6 (*) 12.0 - 15.0 g/dL   HCT 16.1 (*) 09.6 - 04.5 %   MCV 73.9 (*) 78.0 - 100.0 fL   MCH 23.9 (*) 26.0 - 34.0 pg   MCHC 32.3  30.0 - 36.0 g/dL   RDW 40.9  81.1 - 91.4 %   Platelets 286  150 - 400 K/uL  COMPREHENSIVE METABOLIC PANEL     Status: Abnormal   Collection Time    06/02/12  1:27 PM      Result Value Range   Sodium 134 (*) 135 - 145 mEq/L   Potassium 3.1 (*) 3.5 - 5.1 mEq/L   Chloride 101  96 - 112 mEq/L   CO2 21  19 - 32 mEq/L   Glucose, Bld 91  70 - 99 mg/dL   BUN 4 (*) 6 - 23 mg/dL   Creatinine, Ser 7.82 (*) 0.50 - 1.10 mg/dL   Calcium 8.7  8.4 - 95.6 mg/dL   Total Protein 6.4  6.0 - 8.3 g/dL    Albumin 2.7 (*) 3.5 - 5.2 g/dL   AST 12  0 - 37 U/L   ALT 6  0 - 35 U/L   Alkaline Phosphatase 131 (*) 39 - 117 U/L   Total Bilirubin 0.2 (*) 0.3 - 1.2 mg/dL   GFR calc non Af Amer >90  >90 mL/min   GFR calc Af Amer >90  >90 mL/min  URINALYSIS, ROUTINE W REFLEX MICROSCOPIC     Status: Abnormal   Collection Time    06/02/12  2:03 PM      Result Value Range   Color, Urine YELLOW  YELLOW   APPearance CLOUDY (*) CLEAR   Specific Gravity, Urine 1.020  1.005 - 1.030   pH 6.5  5.0 - 8.0   Glucose, UA NEGATIVE  NEGATIVE mg/dL   Hgb urine dipstick TRACE (*) NEGATIVE   Bilirubin Urine NEGATIVE  NEGATIVE   Ketones, ur NEGATIVE  NEGATIVE mg/dL   Protein, ur NEGATIVE  NEGATIVE mg/dL   Urobilinogen, UA 1.0  0.0 - 1.0 mg/dL   Nitrite NEGATIVE  NEGATIVE   Leukocytes, UA MODERATE (*) NEGATIVE  URINE MICROSCOPIC-ADD ON     Status: Abnormal   Collection Time    06/02/12  2:03 PM      Result Value Range   Squamous Epithelial / LPF MANY (*) RARE   WBC, UA 21-50  <3 WBC/hpf   Bacteria, UA FEW (*) RARE    MAU Course  Procedures   Assessment and Plan  1. R-sided abdominal pain during pregnancy - Vicodin 5/325 PO given in MAU - CBC shows elevated white count (16 vs 11.8 on 3/14). Unclear if this is normal variations in pregnancy or if it due to infection. - UA positive for leukocytes - CMP - Pain from biliary colic vs pyelonephritis vs cystitis. It's unclear where exactly her pain is coming from today as it is not typical for biliary pain or UTI pain. Treating with abx for UTI today that will cover for pyelo as well. Also discussed importance of adhering to a low-fat diet to reduce likelihood of stimulating the gall bladder. Instructed to return to MAU if pain worsens, onset of fever, or excessive vomiting. - Follow-up with [facility] on 3/27 appointment    Lorna Dibble 06/02/2012, 1:55 PM   I was present for the exam and agree with above.  [place], [place] 06/07/2012 9:27 PM

## 2012-06-02 NOTE — MAU Note (Signed)
Pt presents for right sided pain that has worsened since Sunday.  Pain is described as being intense on the right side, then will move to entire abdomen and back to right side.  She was admitted over the weekend and dx with gallstones.  She also has c/o a HA that has been present since Sunday.  It has not been relieved by Tylenol.  Denies any pregnancy complications, reports good fetal movement.

## 2012-06-03 LAB — URINE CULTURE: Colony Count: 45000

## 2012-06-08 ENCOUNTER — Encounter: Payer: Self-pay | Admitting: *Deleted

## 2012-06-08 DIAGNOSIS — K8020 Calculus of gallbladder without cholecystitis without obstruction: Secondary | ICD-10-CM

## 2012-06-08 DIAGNOSIS — O26613 Liver and biliary tract disorders in pregnancy, third trimester: Secondary | ICD-10-CM

## 2012-06-08 DIAGNOSIS — O26899 Other specified pregnancy related conditions, unspecified trimester: Secondary | ICD-10-CM

## 2012-06-08 NOTE — MAU Provider Note (Signed)

## 2012-06-11 ENCOUNTER — Encounter: Payer: Self-pay | Admitting: Advanced Practice Midwife

## 2012-06-11 ENCOUNTER — Ambulatory Visit (INDEPENDENT_AMBULATORY_CARE_PROVIDER_SITE_OTHER): Payer: Medicaid Other | Admitting: Advanced Practice Midwife

## 2012-06-11 VITALS — BP 120/66 | Temp 97.5°F | Wt 222.0 lb

## 2012-06-11 DIAGNOSIS — O99013 Anemia complicating pregnancy, third trimester: Secondary | ICD-10-CM

## 2012-06-11 DIAGNOSIS — O26899 Other specified pregnancy related conditions, unspecified trimester: Secondary | ICD-10-CM

## 2012-06-11 DIAGNOSIS — O4700 False labor before 37 completed weeks of gestation, unspecified trimester: Secondary | ICD-10-CM

## 2012-06-11 DIAGNOSIS — F5083 Pica in adults: Secondary | ICD-10-CM

## 2012-06-11 DIAGNOSIS — R1084 Generalized abdominal pain: Secondary | ICD-10-CM

## 2012-06-11 DIAGNOSIS — O99019 Anemia complicating pregnancy, unspecified trimester: Secondary | ICD-10-CM

## 2012-06-11 DIAGNOSIS — F5089 Other specified eating disorder: Secondary | ICD-10-CM

## 2012-06-11 DIAGNOSIS — O9989 Other specified diseases and conditions complicating pregnancy, childbirth and the puerperium: Secondary | ICD-10-CM

## 2012-06-11 LAB — COMPREHENSIVE METABOLIC PANEL
ALT: <8 U/L (ref 0–35)
AST: 14 U/L (ref 0–37)
Albumin: 3.6 g/dL (ref 3.5–5.2)
Alkaline Phosphatase: 138 U/L — ABNORMAL HIGH (ref 39–117)
BUN: 4 mg/dL — ABNORMAL LOW (ref 6–23)
CO2: 20 mEq/L (ref 19–32)
Calcium: 8.8 mg/dL (ref 8.4–10.5)
Chloride: 105 mEq/L (ref 96–112)
Creat: 0.45 mg/dL — ABNORMAL LOW (ref 0.50–1.10)
Glucose, Bld: 66 mg/dL — ABNORMAL LOW (ref 70–99)
Potassium: 3.8 mEq/L (ref 3.5–5.3)
Sodium: 136 mEq/L (ref 135–145)
Total Bilirubin: 0.3 mg/dL (ref 0.3–1.2)
Total Protein: 6.4 g/dL (ref 6.0–8.3)

## 2012-06-11 LAB — POCT URINALYSIS DIP (DEVICE)
Bilirubin Urine: NEGATIVE
Glucose, UA: NEGATIVE mg/dL
Hgb urine dipstick: NEGATIVE
Ketones, ur: NEGATIVE mg/dL
Nitrite: NEGATIVE
Protein, ur: NEGATIVE mg/dL
Specific Gravity, Urine: 1.02 (ref 1.005–1.030)
Urobilinogen, UA: 0.2 mg/dL (ref 0.0–1.0)
pH: 6.5 (ref 5.0–8.0)

## 2012-06-11 LAB — CBC
HCT: 29 % — ABNORMAL LOW (ref 36.0–46.0)
Hemoglobin: 9.6 g/dL — ABNORMAL LOW (ref 12.0–15.0)
MCH: 22.8 pg — ABNORMAL LOW (ref 26.0–34.0)
MCHC: 33.1 g/dL (ref 30.0–36.0)
MCV: 68.9 fL — ABNORMAL LOW (ref 78.0–100.0)
Platelets: 347 10*3/uL (ref 150–400)
RBC: 4.21 MIL/uL (ref 3.87–5.11)
RDW: 16.6 % — ABNORMAL HIGH (ref 11.5–15.5)
WBC: 12.4 10*3/uL — ABNORMAL HIGH (ref 4.0–10.5)

## 2012-06-11 MED ORDER — TRAMADOL HCL 50 MG PO TABS: 50.0000 mg | ORAL_TABLET | Freq: Four times a day (QID) | ORAL | Status: DC | PRN

## 2012-06-11 NOTE — Progress Notes (Signed)
See new OB note.  Will refer to Surgery. Percocet makes her sick. Will try Tramadol  Subjective:    Emma Chambers is a G2P1001 [redacted]w[redacted]d being seen today for her first obstetrical visit.  Her obstetrical history is significant for non-compliance and cholelithiasis.  Eats high fat food despite instructions not to. Patient does intend to breast feed. Pregnancy history fully reviewed.  Patient reports upper abdominal pain all day .  Filed Vitals:   06/11/12 1009  BP: 120/66  Temp: 97.5 F (36.4 C)  Weight: 222 lb (100.699 kg)    HISTORY: OB History   Grav Para Term Preterm Abortions TAB SAB Ect Mult Living   2 1 1  0 0 0 0 0 0 1     # Outc Date GA Lbr Len/2nd Wgt Sex Del Anes PTL Lv   1 TRM 3/13 [redacted]w[redacted]d 22:02 / 00:26 8lb1.6oz(3.675kg) F SVD EPI  Yes   Comments: none   2 CUR              Past Medical History  Diagnosis Date  . UTI (lower urinary tract infection)   . Hypertension     GHTN with last pg  . Pregnancy induced hypertension   . Headache    History reviewed. No pertinent past surgical history. Family History  Problem Relation Age of Onset  . Asthma Mother   . Hypertension Mother   . Diabetes Maternal Grandmother   . Hypertension Maternal Grandmother   . Stroke Maternal Grandmother   . Anesthesia problems Neg Hx   . Malignant hyperthermia Neg Hx   . Pseudochol deficiency Neg Hx   . Hypotension Neg Hx      Exam    Uterus:     Pelvic Exam:    Perineum: No Hemorrhoids   Vulva: Bartholin's, Urethra, Skene's normal   Vagina:  normal mucosa, normal discharge   pH:    Cervix: multiparous appearance   Adnexa: no mass, fullness, tenderness   Bony Pelvis: gynecoid  System: Breast:  normal appearance, no masses or tenderness   Skin: normal coloration and turgor, no rashes    Neurologic: oriented, negative, normal mood   Extremities: normal strength, tone, and muscle mass   HEENT neck supple with midline trachea   Mouth/Teeth mucous membranes moist, pharynx  normal without lesions   Neck supple and no masses   Cardiovascular: regular rate and rhythm, no murmurs or gallops   Respiratory:  appears well, vitals normal, no respiratory distress, acyanotic, normal RR, ear and throat exam is normal, neck free of mass or lymphadenopathy, chest clear, no wheezing, crepitations, rhonchi, normal symmetric air entry   Abdomen: soft, non-tender; bowel sounds normal; no masses,  no organomegaly   Urinary: urethral meatus normal      Assessment:    Pregnancy: G2P1001 Patient Active Problem List  Diagnosis  . H. pylori infection  . Generalized headaches  . Encounter for insertion of mirena IUD  . Pregnancy with generalized abdominal pain, antepartum  . Cholelithiasis complicating pregnancy, antepartum  . Pica in adults  . Preterm labor without delivery in third trimester        Plan:     Initial labs drawn. Prenatal vitamins. Problem list reviewed and updated. Genetic Screening discussed First Screen: too late.  Ultrasound discussed; fetal survey: results reviewed.  Follow up in 2 weeks. 50% of 30 min visit spent on counseling and coordination of care.  General Surgery consult ordered to plan for cholecystectomy at their recommendation Advised to avoid  high fat or spicy foods.  States eats [place] "so I won't eat my usual bad foods"  States Percocet makes her too dizzy to care for child Changed Rx to Tramadol   [facility] 06/12/2012

## 2012-06-11 NOTE — Progress Notes (Signed)
Applied EFM & Toco to determine presence of UC's- none detected in 9 minutes, per Emma Chambers okay to discontinue monitors

## 2012-06-11 NOTE — Progress Notes (Signed)
Pulse-124  Edema-legs/feet  Pain-"from gallstones" and migraines Vaginal pressure/swelling "aches sometimes" Declined flu vaccine New ob packet given Weight gain of 11-20lb

## 2012-06-11 NOTE — Patient Instructions (Addendum)
Cholelithiasis Cholelithiasis (also called gallstones) is a form of gallbladder disease where gallstones form in your gallbladder. The gallbladder is a non-essential organ that stores bile made in the liver, which helps digest fats. Gallstones begin as small crystals and slowly grow into stones. Gallstone pain occurs when the gallbladder spasms, and a gallstone is blocking the duct. Pain can also occur when a stone passes out of the duct.  Women are more likely to develop gallstones than men. Other factors that increase the risk of gallbladder disease are:  Having multiple pregnancies. Physicians sometimes advise removing diseased gallbladders before future pregnancies.  Obesity.  Diets heavy in fried foods and fat.  Increasing age (older than 32).  Prolonged use of medications containing female hormones.  Diabetes mellitus.  Rapid weight loss.  Family history of gallstones (heredity). SYMPTOMS  Feeling sick to your stomach (nauseous).  Abdominal pain.  Yellowing of the skin (jaundice).  Sudden pain. It may persist from several minutes to several hours.  Worsening pain with deep breathing or when jarred.  Fever.  Tenderness to the touch. In some cases, when gallstones do not move into the bile duct, people have no pain or symptoms. These are called "silent" gallstones. TREATMENT In severe cases, emergency surgery may be required. HOME CARE INSTRUCTIONS   Only take over-the-counter or prescription medicines for pain, discomfort, or fever as directed by your caregiver.  Follow a low-fat diet until seen again. Fat causes the gallbladder to contract, which can result in pain.  Follow up as instructed. Attacks are almost always recurrent and surgery is usually required for permanent treatment. SEEK IMMEDIATE MEDICAL CARE IF:   Your pain increases and is not controlled by medications.  You have an oral temperature above 102 F (38.9 C), not controlled by medication.  You  develop nausea and vomiting. MAKE SURE YOU:   Understand these instructions.  Will watch your condition.  Will get help right away if you are not doing well or get worse. Document Released: 02/28/2005 Document Revised: 05/27/2011 Document Reviewed: 05/03/2010 [facility] Patient Information 2013 [place], [place]. Pregnancy - Third Trimester The third trimester of pregnancy (the last 3 months) is a period of the most rapid growth for you and your baby. The baby approaches a length of 20 inches and a weight of 6 to 10 pounds. The baby is adding on fat and getting ready for life outside your body. While inside, babies have periods of sleeping and waking, suck their thumbs, and hiccups. You can often feel small contractions of the uterus. This is false labor. It is also called Braxton-Hicks contractions. This is like a practice for labor. The usual problems in this stage of pregnancy include more difficulty breathing, swelling of the hands and feet from water retention, and having to urinate more often because of the uterus and baby pressing on your bladder.  PRENATAL EXAMS  Blood work may continue to be done during prenatal exams. These tests are done to check on your health and the probable health of your baby. Blood work is used to follow your blood levels (hemoglobin). Anemia (low hemoglobin) is common during pregnancy. Iron and vitamins are given to help prevent this. You may also continue to be checked for diabetes. Some of the past blood tests may be done again.  The size of the uterus is measured during each visit. This makes sure your baby is growing properly according to your pregnancy dates.  Your blood pressure is checked every prenatal visit. This is to  make sure you are not getting toxemia.  Your urine is checked every prenatal visit for infection, diabetes and protein.  Your weight is checked at each visit. This is done to make sure gains are happening at the suggested rate and that you  and your baby are growing normally.  Sometimes, an ultrasound is performed to confirm the position and the proper growth and development of the baby. This is a test done that bounces harmless sound waves off the baby so your caregiver can more accurately determine due dates.  Discuss the type of pain medication and anesthesia you will have during your labor and delivery.  Discuss the possibility and anesthesia if a Cesarean Section might be necessary.  Inform your caregiver if there is any mental or physical violence at home. Sometimes, a specialized non-stress test, contraction stress test and biophysical profile are done to make sure the baby is not having a problem. Checking the amniotic fluid surrounding the baby is called an amniocentesis. The amniotic fluid is removed by sticking a needle into the belly (abdomen). This is sometimes done near the end of pregnancy if an early delivery is required. In this case, it is done to help make sure the baby's lungs are mature enough for the baby to live outside of the womb. If the lungs are not mature and it is unsafe to deliver the baby, an injection of cortisone medication is given to the mother 1 to 2 days before the delivery. This helps the baby's lungs mature and makes it safer to deliver the baby. CHANGES OCCURING IN THE THIRD TRIMESTER OF PREGNANCY Your body goes through many changes during pregnancy. They vary from person to person. Talk to your caregiver about changes you notice and are concerned about.  During the last trimester, you have probably had an increase in your appetite. It is normal to have cravings for certain foods. This varies from person to person and pregnancy to pregnancy.  You may begin to get stretch marks on your hips, abdomen, and breasts. These are normal changes in the body during pregnancy. There are no exercises or medications to take which prevent this change.  Constipation may be treated with a stool softener or  adding bulk to your diet. Drinking lots of fluids, fiber in vegetables, fruits, and whole grains are helpful.  Exercising is also helpful. If you have been very active up until your pregnancy, most of these activities can be continued during your pregnancy. If you have been less active, it is helpful to start an exercise program such as walking. Consult your caregiver before starting exercise programs.  Avoid all smoking, alcohol, un-prescribed drugs, herbs and "street drugs" during your pregnancy. These chemicals affect the formation and growth of the baby. Avoid chemicals throughout the pregnancy to ensure the delivery of a healthy infant.  Backache, varicose veins and hemorrhoids may develop or get worse.  You will tire more easily in the third trimester, which is normal.  The baby's movements may be stronger and more often.  You may become short of breath easily.  Your belly button may stick out.  A yellow discharge may leak from your breasts called colostrum.  You may have a bloody mucus discharge. This usually occurs a few days to a week before labor begins. HOME CARE INSTRUCTIONS   Keep your caregiver's appointments. Follow your caregiver's instructions regarding medication use, exercise, and diet.  During pregnancy, you are providing food for you and your baby. Continue to eat regular,  well-balanced meals. Choose foods such as meat, fish, milk and other low fat dairy products, vegetables, fruits, and whole-grain breads and cereals. Your caregiver will tell you of the ideal weight gain.  A physical sexual relationship may be continued throughout pregnancy if there are no other problems such as early (premature) leaking of amniotic fluid from the membranes, vaginal bleeding, or belly (abdominal) pain.  Exercise regularly if there are no restrictions. Check with your caregiver if you are unsure of the safety of your exercises. Greater weight gain will occur in the last 2 trimesters of  pregnancy. Exercising helps:  Control your weight.  Get you in shape for labor and delivery.  You lose weight after you deliver.  Rest a lot with legs elevated, or as needed for leg cramps or low back pain.  Wear a good support or jogging bra for breast tenderness during pregnancy. This may help if worn during sleep. Pads or tissues may be used in the bra if you are leaking colostrum.  Do not use hot tubs, steam rooms, or saunas.  Wear your seat belt when driving. This protects you and your baby if you are in an accident.  Avoid raw meat, cat litter boxes and soil used by cats. These carry germs that can cause birth defects in the baby.  It is easier to loose urine during pregnancy. Tightening up and strengthening the pelvic muscles will help with this problem. You can practice stopping your urination while you are going to the bathroom. These are the same muscles you need to strengthen. It is also the muscles you would use if you were trying to stop from passing gas. You can practice tightening these muscles up 10 times a set and repeating this about 3 times per day. Once you know what muscles to tighten up, do not perform these exercises during urination. It is more likely to cause an infection by backing up the urine.  Ask for help if you have financial, counseling or nutritional needs during pregnancy. Your caregiver will be able to offer counseling for these needs as well as refer you for other special needs.  Make a list of emergency phone numbers and have them available.  Plan on getting help from family or friends when you go home from the hospital.  Make a trial run to the hospital.  Take prenatal classes with the father to understand, practice and ask questions about the labor and delivery.  Prepare the baby's room/nursery.  Do not travel out of the city unless it is absolutely necessary and with the advice of your caregiver.  Wear only low or no heal shoes to have better  balance and prevent falling. MEDICATIONS AND DRUG USE IN PREGNANCY  Take prenatal vitamins as directed. The vitamin should contain 1 milligram of folic acid. Keep all vitamins out of reach of children. Only a couple vitamins or tablets containing iron may be fatal to a baby or young child when ingested.  Avoid use of all medications, including herbs, over-the-counter medications, not prescribed or suggested by your caregiver. Only take over-the-counter or prescription medicines for pain, discomfort, or fever as directed by your caregiver. Do not use aspirin, ibuprofen (Motrin, Advil, Nuprin) or naproxen (Aleve) unless OK'd by your caregiver.  Let your caregiver also know about herbs you may be using.  Alcohol is related to a number of birth defects. This includes fetal alcohol syndrome. All alcohol, in any form, should be avoided completely. Smoking will cause low birth rate  and premature babies.  Street/illegal drugs are very harmful to the baby. They are absolutely forbidden. A baby born to an addicted mother will be addicted at birth. The baby will go through the same withdrawal an adult does. SEEK MEDICAL CARE IF: You have any concerns or worries during your pregnancy. It is better to call with your questions if you feel they cannot wait, rather than worry about them. DECISIONS ABOUT CIRCUMCISION You may or may not know the sex of your baby. If you know your baby is a boy, it may be time to think about circumcision. Circumcision is the removal of the foreskin of the penis. This is the skin that covers the sensitive end of the penis. There is no proven medical need for this. Often this decision is made on what is popular at the time or based upon religious beliefs and social issues. You can discuss these issues with your caregiver or pediatrician. SEEK IMMEDIATE MEDICAL CARE IF:   An unexplained oral temperature above 102 F (38.9 C) develops, or as your caregiver suggests.  You have  leaking of fluid from the vagina (birth canal). If leaking membranes are suspected, take your temperature and tell your caregiver of this when you call.  There is vaginal spotting, bleeding or passing clots. Tell your caregiver of the amount and how many pads are used.  You develop a bad smelling vaginal discharge with a change in the color from clear to white.  You develop vomiting that lasts more than 24 hours.  You develop chills or fever.  You develop shortness of breath.  You develop burning on urination.  You loose more than 2 pounds of weight or gain more than 2 pounds of weight or as suggested by your caregiver.  You notice sudden swelling of your face, hands, and feet or legs.  You develop belly (abdominal) pain. Round ligament discomfort is a common non-cancerous (benign) cause of abdominal pain in pregnancy. Your caregiver still must evaluate you.  You develop a severe headache that does not go away.  You develop visual problems, blurred or double vision.  If you have not felt your baby move for more than 1 hour. If you think the baby is not moving as much as usual, eat something with sugar in it and lie down on your left side for an hour. The baby should move at least 4 to 5 times per hour. Call right away if your baby moves less than that.  You fall, are in a car accident or any kind of trauma.  There is mental or physical violence at home. Document Released: 02/26/2001 Document Revised: 05/27/2011 Document Reviewed: 08/31/2008 [facility] Patient Information 2013 [place], [place].

## 2012-06-12 ENCOUNTER — Encounter: Payer: Self-pay | Admitting: Advanced Practice Midwife

## 2012-06-12 DIAGNOSIS — F5089 Other specified eating disorder: Secondary | ICD-10-CM | POA: Insufficient documentation

## 2012-06-12 DIAGNOSIS — O99013 Anemia complicating pregnancy, third trimester: Secondary | ICD-10-CM | POA: Insufficient documentation

## 2012-06-14 ENCOUNTER — Encounter (HOSPITAL_COMMUNITY): Payer: Self-pay | Admitting: Family

## 2012-06-14 ENCOUNTER — Inpatient Hospital Stay (EMERGENCY_DEPARTMENT_HOSPITAL)
Admission: AD | Admit: 2012-06-14 | Discharge: 2012-06-14 | Disposition: A | Discharge status: Home / Self Care | Payer: Medicaid Other | Source: Ambulatory Visit | Attending: Obstetrics & Gynecology | Admitting: Obstetrics & Gynecology

## 2012-06-14 ENCOUNTER — Emergency Department (HOSPITAL_COMMUNITY): Payer: Medicaid Other

## 2012-06-14 ENCOUNTER — Emergency Department (HOSPITAL_COMMUNITY)
Admission: EM | Admit: 2012-06-14 | Discharge: 2012-06-14 | Disposition: A | Discharge status: Home / Self Care | Payer: Medicaid Other | Attending: Emergency Medicine | Admitting: Emergency Medicine

## 2012-06-14 DIAGNOSIS — K8020 Calculus of gallbladder without cholecystitis without obstruction: Secondary | ICD-10-CM

## 2012-06-14 DIAGNOSIS — Z87440 Personal history of urinary (tract) infections: Secondary | ICD-10-CM | POA: Insufficient documentation

## 2012-06-14 DIAGNOSIS — O26619 Liver and biliary tract disorders in pregnancy, unspecified trimester: Secondary | ICD-10-CM

## 2012-06-14 DIAGNOSIS — O26899 Other specified pregnancy related conditions, unspecified trimester: Secondary | ICD-10-CM

## 2012-06-14 DIAGNOSIS — Z79899 Other long term (current) drug therapy: Secondary | ICD-10-CM | POA: Insufficient documentation

## 2012-06-14 DIAGNOSIS — O219 Vomiting of pregnancy, unspecified: Secondary | ICD-10-CM | POA: Insufficient documentation

## 2012-06-14 DIAGNOSIS — I10 Essential (primary) hypertension: Secondary | ICD-10-CM | POA: Insufficient documentation

## 2012-06-14 DIAGNOSIS — R1011 Right upper quadrant pain: Secondary | ICD-10-CM | POA: Insufficient documentation

## 2012-06-14 DIAGNOSIS — O9989 Other specified diseases and conditions complicating pregnancy, childbirth and the puerperium: Secondary | ICD-10-CM | POA: Insufficient documentation

## 2012-06-14 DIAGNOSIS — G479 Sleep disorder, unspecified: Secondary | ICD-10-CM | POA: Insufficient documentation

## 2012-06-14 HISTORY — DX: Calculus of gallbladder without cholecystitis without obstruction: K80.20

## 2012-06-14 LAB — LIPASE, BLOOD
Lipase: 22 U/L (ref 11–59)
Lipase: 18 U/L (ref 11–59)

## 2012-06-14 LAB — CBC WITH DIFFERENTIAL/PLATELET
Basophils Absolute: 0 10*3/uL (ref 0.0–0.1)
Basophils Relative: 0 % (ref 0–1)
Eosinophils Absolute: 0.2 10*3/uL (ref 0.0–0.7)
Eosinophils Relative: 2 % (ref 0–5)
HCT: 27.6 % — ABNORMAL LOW (ref 36.0–46.0)
Hemoglobin: 8.9 g/dL — ABNORMAL LOW (ref 12.0–15.0)
Lymphocytes Relative: 23 % (ref 12–46)
Lymphs Abs: 2.2 10*3/uL (ref 0.7–4.0)
MCH: 23.7 pg — ABNORMAL LOW (ref 26.0–34.0)
MCHC: 32.2 g/dL (ref 30.0–36.0)
MCV: 73.6 fL — ABNORMAL LOW (ref 78.0–100.0)
Monocytes Absolute: 0.6 10*3/uL (ref 0.1–1.0)
Monocytes Relative: 6 % (ref 3–12)
Neutro Abs: 6.6 10*3/uL (ref 1.7–7.7)
Neutrophils Relative %: 69 % (ref 43–77)
Platelets: 279 10*3/uL (ref 150–400)
RBC: 3.75 MIL/uL — ABNORMAL LOW (ref 3.87–5.11)
RDW: 15.7 % — ABNORMAL HIGH (ref 11.5–15.5)
WBC: 9.6 10*3/uL (ref 4.0–10.5)

## 2012-06-14 LAB — COMPREHENSIVE METABOLIC PANEL WITH GFR
ALT: 6 U/L (ref 0–35)
AST: 13 U/L (ref 0–37)
Albumin: 2.6 g/dL — ABNORMAL LOW (ref 3.5–5.2)
Alkaline Phosphatase: 145 U/L — ABNORMAL HIGH (ref 39–117)
BUN: 4 mg/dL — ABNORMAL LOW (ref 6–23)
CO2: 20 meq/L (ref 19–32)
Calcium: 8.8 mg/dL (ref 8.4–10.5)
Chloride: 104 meq/L (ref 96–112)
Creatinine, Ser: 0.37 mg/dL — ABNORMAL LOW (ref 0.50–1.10)
GFR calc Af Amer: >90 mL/min (ref >90)
GFR calc non Af Amer: >90 mL/min (ref >90)
Glucose, Bld: 78 mg/dL (ref 70–99)
Potassium: 3.3 meq/L — ABNORMAL LOW (ref 3.5–5.1)
Sodium: 135 meq/L (ref 135–145)
Total Bilirubin: 0.3 mg/dL (ref 0.3–1.2)
Total Protein: 6.2 g/dL (ref 6.0–8.3)

## 2012-06-14 LAB — BASIC METABOLIC PANEL
BUN: 4 mg/dL — ABNORMAL LOW (ref 6–23)
CO2: 22 mEq/L (ref 19–32)
Calcium: 8.6 mg/dL (ref 8.4–10.5)
Chloride: 104 mEq/L (ref 96–112)
Creatinine, Ser: 0.49 mg/dL — ABNORMAL LOW (ref 0.50–1.10)
GFR calc Af Amer: >90 mL/min (ref >90)
GFR calc non Af Amer: >90 mL/min (ref >90)
Glucose, Bld: 70 mg/dL (ref 70–99)
Potassium: 3.6 mEq/L (ref 3.5–5.1)
Sodium: 135 mEq/L (ref 135–145)

## 2012-06-14 LAB — HEPATIC FUNCTION PANEL
ALT: 7 U/L (ref 0–35)
AST: 19 U/L (ref 0–37)
Albumin: 2.6 g/dL — ABNORMAL LOW (ref 3.5–5.2)
Alkaline Phosphatase: 149 U/L — ABNORMAL HIGH (ref 39–117)
Bilirubin, Direct: <0.1 mg/dL (ref 0.0–0.3)
Total Bilirubin: 0.3 mg/dL (ref 0.3–1.2)
Total Protein: 6.1 g/dL (ref 6.0–8.3)

## 2012-06-14 LAB — CBC
HCT: 26.5 % — ABNORMAL LOW (ref 36.0–46.0)
Hemoglobin: 8.6 g/dL — ABNORMAL LOW (ref 12.0–15.0)
MCH: 23.8 pg — ABNORMAL LOW (ref 26.0–34.0)
MCHC: 32.5 g/dL (ref 30.0–36.0)
MCV: 73.4 fL — ABNORMAL LOW (ref 78.0–100.0)
Platelets: 247 10*3/uL (ref 150–400)
RBC: 3.61 MIL/uL — ABNORMAL LOW (ref 3.87–5.11)
RDW: 15.9 % — ABNORMAL HIGH (ref 11.5–15.5)
WBC: 9.8 10*3/uL (ref 4.0–10.5)

## 2012-06-14 LAB — AMYLASE: Amylase: 36 U/L (ref 0–105)

## 2012-06-14 MED ORDER — GI COCKTAIL ~~LOC~~
30.0000 mL | Freq: Once | ORAL | Status: AC
  Administered 2012-06-14: 30 mL via ORAL
  Filled 2012-06-14: qty 30

## 2012-06-14 MED ORDER — OXYCODONE-ACETAMINOPHEN 5-325 MG PO TABS
1.0000 | ORAL_TABLET | Freq: Once | ORAL | Status: AC
  Administered 2012-06-14: 1 via ORAL
  Filled 2012-06-14: qty 1

## 2012-06-14 MED ORDER — OXYCODONE-ACETAMINOPHEN 5-325 MG PO TABS: 1.0000 | ORAL_TABLET | ORAL | Status: DC | PRN

## 2012-06-14 MED ORDER — ONDANSETRON HCL 4 MG/2ML IJ SOLN
4.0000 mg | Freq: Once | INTRAMUSCULAR | Status: AC
  Administered 2012-06-14: 4 mg via INTRAVENOUS
  Filled 2012-06-14: qty 2

## 2012-06-14 MED ORDER — HYDROMORPHONE HCL PF 1 MG/ML IJ SOLN
1.0000 mg | Freq: Once | INTRAMUSCULAR | Status: AC
  Administered 2012-06-14: 1 mg via INTRAMUSCULAR
  Filled 2012-06-14: qty 1

## 2012-06-14 MED ORDER — MORPHINE SULFATE 4 MG/ML IJ SOLN
4.0000 mg | Freq: Once | INTRAMUSCULAR | Status: AC
  Administered 2012-06-14: 4 mg via INTRAVENOUS
  Filled 2012-06-14: qty 1

## 2012-06-14 MED ORDER — ONDANSETRON 8 MG PO TBDP: 8.0000 mg | ORAL_TABLET | Freq: Three times a day (TID) | ORAL | Status: DC | PRN

## 2012-06-14 MED ORDER — SODIUM CHLORIDE 0.9 % IV SOLN
Freq: Once | INTRAVENOUS | Status: AC
  Administered 2012-06-14: 18:00:00 via INTRAVENOUS

## 2012-06-14 MED ORDER — ONDANSETRON 8 MG PO TBDP
8.0000 mg | ORAL_TABLET | Freq: Once | ORAL | Status: AC
  Administered 2012-06-14: 8 mg via ORAL
  Filled 2012-06-14: qty 1

## 2012-06-14 NOTE — MAU Note (Signed)
Patient presents to MAU with c/o gallbladder pain since 1130 last night; reports she has a consult at [facility] on Tuesday 4/1 at 10:45. Emesis x 7 in 24 hours. Reports normal BM yesterday. Denies vaginal bleeding, LOF. Reports good fetal movement.

## 2012-06-14 NOTE — ED Notes (Signed)
Primary RN stuck pt x3, not successful. 2nd RN to attempt access.

## 2012-06-14 NOTE — MAU Provider Note (Signed)
History     CSN: [id number]  Arrival date and time: 06/14/12 4098   First Provider Initiated Contact with Patient 06/14/12 [phone]      Chief Complaint  Patient presents with  . Abdominal Pain   HPI 22 y.o. G2P1001 at [redacted]w[redacted]d with RUQ pain and generalized right sided abd pain, radiates to back, known gallstones. Has appt with [facility] on Tuesday. States pain feels like contractions sometimes, comes and goes. Trying to reduce fatty foods.    Past Medical History  Diagnosis Date  . UTI (lower urinary tract infection)   . Hypertension     GHTN with last pg  . Pregnancy induced hypertension   . Headache   . Gallstones     History reviewed. No pertinent past surgical history.  Family History  Problem Relation Age of Onset  . Asthma Mother   . Hypertension Mother   . Diabetes Maternal Grandmother   . Hypertension Maternal Grandmother   . Stroke Maternal Grandmother   . Anesthesia problems Neg Hx   . Malignant hyperthermia Neg Hx   . Pseudochol deficiency Neg Hx   . Hypotension Neg Hx     History  Substance Use Topics  . Smoking status: Never Smoker   . Smokeless tobacco: Never Used  . Alcohol Use: No    Allergies:  Allergies  Allergen Reactions  . Ambien (Zolpidem Tartrate) Other (See Comments)    Caused her to have hallucinations  . Amoxicillin Nausea And Vomiting and Other (See Comments)    Tics & twitches    Prescriptions prior to admission  Medication Sig Dispense Refill  . traMADol (ULTRAM) 50 MG tablet Take 50 mg by mouth every 6 (six) hours as needed for pain.      . [DISCONTINUED] traMADol (ULTRAM) 50 MG tablet Take 1 tablet (50 mg total) by mouth every 6 (six) hours as needed for pain.  60 tablet  0  . ranitidine (ZANTAC) 150 MG tablet Take 150 mg by mouth 2 (two) times daily.         Review of Systems  Constitutional: Negative.  Negative for fever and chills.  Respiratory: Negative.   Cardiovascular: Negative.   Gastrointestinal:  Positive for nausea and abdominal pain. Negative for vomiting, diarrhea and constipation.  Genitourinary: Negative for dysuria, urgency, frequency, hematuria and flank pain.       Negative for vaginal bleeding, LOF   Musculoskeletal: Negative.   Neurological: Negative.   Psychiatric/Behavioral: Negative.    Physical Exam   Blood pressure 101/55, pulse 102, temperature 98.6 F (37 C), temperature source Oral, resp. rate 18, height 5\' 1"  (1.549 m), weight 222 lb (100.699 kg), last menstrual period 10/17/2011.  Physical Exam  Nursing note and vitals reviewed. Constitutional: She is oriented to person, place, and time. She appears well-developed and well-nourished. She appears distressed.  Cardiovascular: Normal rate.   Respiratory: Effort normal.  GI: Soft. There is tenderness (RUQ, epigastric).  Genitourinary:  Dilation: 2.5 Effacement (%): Thick Cervical Position: Posterior Exam by:: Bascom Levels, CNM    Musculoskeletal: Normal range of motion.  Neurological: She is alert and oriented to person, place, and time.  Skin: Skin is warm and dry.  Psychiatric: She has a normal mood and affect.   Reactive NST, no contractions  MAU Course  Procedures Results for orders placed during the hospital encounter of 06/14/12 (from the past 24 hour(s))  CBC     Status: Abnormal   Collection Time    06/14/12 10:00 AM  Result Value Range   WBC 9.8  4.0 - 10.5 K/uL   RBC 3.61 (*) 3.87 - 5.11 MIL/uL   Hemoglobin 8.6 (*) 12.0 - 15.0 g/dL   HCT 11.9 (*) 14.7 - 82.9 %   MCV 73.4 (*) 78.0 - 100.0 fL   MCH 23.8 (*) 26.0 - 34.0 pg   MCHC 32.5  30.0 - 36.0 g/dL   RDW 56.2 (*) 13.0 - 86.5 %   Platelets 247  150 - 400 K/uL  COMPREHENSIVE METABOLIC PANEL     Status: Abnormal   Collection Time    06/14/12 10:00 AM      Result Value Range   Sodium 135  135 - 145 mEq/L   Potassium 3.3 (*) 3.5 - 5.1 mEq/L   Chloride 104  96 - 112 mEq/L   CO2 20  19 - 32 mEq/L   Glucose, Bld 78  70 - 99 mg/dL    BUN 4 (*) 6 - 23 mg/dL   Creatinine, Ser 7.84 (*) 0.50 - 1.10 mg/dL   Calcium 8.8  8.4 - 69.6 mg/dL   Total Protein 6.2  6.0 - 8.3 g/dL   Albumin 2.6 (*) 3.5 - 5.2 g/dL   AST 13  0 - 37 U/L   ALT 6  0 - 35 U/L   Alkaline Phosphatase 145 (*) 39 - 117 U/L   Total Bilirubin 0.3  0.3 - 1.2 mg/dL   GFR calc non Af Amer >90  >90 mL/min   GFR calc Af Amer >90  >90 mL/min  AMYLASE     Status: None   Collection Time    06/14/12 10:00 AM      Result Value Range   Amylase 36  0 - 105 U/L  LIPASE, BLOOD     Status: None   Collection Time    06/14/12 10:00 AM      Result Value Range   Lipase 22  11 - 59 U/L   Pain somewhat improved with Dilaudid 1 mg IV, heartburn relieved with GI cocktail.   Pt intially reported improvement with Dilaudid, then stated pain was no better. Gave percocet prior to discharge. Pt states she doesn't like to take pain medications because they make her tired and she provides care for her child alone during the week. Pt states she is thinking about going to [facility] to see if they can do anything for her since this is not a pregnancy related issue. Discussed that there was probably not anything else they could do at this time other than manage her pain.   Assessment and Plan   1. Cholelithiasis complicating pregnancy, antepartum, third trimester   2. Pregnancy with generalized abdominal pain, antepartum   Follow up on Tuesday with [facility] and as scheduled in OB clinic    Medication List    TAKE these medications       ranitidine 150 MG tablet  Commonly known as:  ZANTAC  Take 150 mg by mouth 2 (two) times daily.     traMADol 50 MG tablet  Commonly known as:  ULTRAM  Take 50 mg by mouth every 6 (six) hours as needed for pain.            Follow-up Information   Follow up with [facility]. (as scheduled)    Contact information:   [address] [place] [address] [phone]        [facility] 06/14/2012,  9:28 AM

## 2012-06-14 NOTE — ED Notes (Signed)
Pt has had 5 IV starts that have blown. IV team paged.

## 2012-06-14 NOTE — ED Notes (Signed)
Ultrasound technologist at bedside.

## 2012-06-14 NOTE — ED Notes (Signed)
BP taken X4 with all rendering decreased pressure - notified RN via radio

## 2012-06-14 NOTE — ED Provider Notes (Addendum)
History     CSN: [id number]  Arrival date & time 06/14/12  1328   First MD Initiated Contact with Patient 06/14/12 1507      Chief Complaint  Patient presents with  . gallstones   . [redacted] wks pregnant     (Consider location/radiation/quality/duration/timing/severity/associated sxs/prior treatment) HPI Comments: Pt is a G2P1 woman, about 35 weeks of gestation with recently diagnoses cholelithiasis, surgical f.u set up for Tuesday - comes in with cc of abd pain. Pt't pain started last night, and was quite significant all night, and she was unable to sleep well. The pain has eased up during my evaluation, however, she is still getting intermittent RUQ pains. Pt has associated nausea, no emesis. No UTI like sx, flank pain, no vaginal bleeding, discharge or contractions. Pt has been taking her analgesics - with minimal relief.  The history is provided by the patient.    Past Medical History  Diagnosis Date  . UTI (lower urinary tract infection)   . Hypertension     GHTN with last pg  . Pregnancy induced hypertension   . Headache   . Gallstones     No past surgical history on file.  Family History  Problem Relation Age of Onset  . Asthma Mother   . Hypertension Mother   . Diabetes Maternal Grandmother   . Hypertension Maternal Grandmother   . Stroke Maternal Grandmother   . Anesthesia problems Neg Hx   . Malignant hyperthermia Neg Hx   . Pseudochol deficiency Neg Hx   . Hypotension Neg Hx     History  Substance Use Topics  . Smoking status: Never Smoker   . Smokeless tobacco: Never Used  . Alcohol Use: No    OB History   Grav Para Term Preterm Abortions TAB SAB Ect Mult Living   2 1 1  0 0 0 0 0 0 1      Review of Systems  Constitutional: Positive for activity change.  HENT: Negative for facial swelling.   Respiratory: Negative for cough, shortness of breath and wheezing.   Cardiovascular: Negative for chest pain.  Gastrointestinal: Positive for nausea and  abdominal pain. Negative for vomiting, diarrhea, constipation, blood in stool and abdominal distention.  Genitourinary: Negative for dysuria, hematuria, vaginal bleeding, vaginal discharge and difficulty urinating.  Skin: Negative for color change.  Neurological: Negative for speech difficulty and headaches.  Hematological: Does not bruise/bleed easily.  Psychiatric/Behavioral: Negative for confusion.    Allergies  Ambien and Amoxicillin  Home Medications   Current Outpatient Rx  Name  Route  Sig  Dispense  Refill  . ranitidine (ZANTAC) 150 MG tablet   Oral   Take 150 mg by mouth 2 (two) times daily.          . traMADol (ULTRAM) 50 MG tablet   Oral   Take 50 mg by mouth every 6 (six) hours as needed for pain.           BP 116/51  Pulse 98  Temp(Src) 98.6 F (37 C) (Oral)  Resp 18  SpO2 99%  LMP 10/17/2011  Physical Exam  Nursing note and vitals reviewed. Constitutional: She is oriented to person, place, and time. She appears well-developed and well-nourished.  HENT:  Head: Normocephalic and atraumatic.  Eyes: EOM are normal. Pupils are equal, round, and reactive to light.  Neck: Neck supple.  Cardiovascular: Normal rate, regular rhythm and normal heart sounds.   No murmur heard. Pulmonary/Chest: Effort normal. No respiratory distress.  Abdominal:  Soft. She exhibits no distension. There is tenderness. There is no rebound and no guarding.  RUQ tenderness, negative Murphy's  Neurological: She is alert and oriented to person, place, and time.  Skin: Skin is warm and dry.    ED Course  Procedures (including critical care time)  Labs Reviewed  CBC WITH DIFFERENTIAL  BASIC METABOLIC PANEL  HEPATIC FUNCTION PANEL  LIPASE, BLOOD   No results found.   No diagnosis found.    MDM  DDx includes: Pancreatitis Hepatobiliary pathology including cholecystitis Gastritis/PUD SBO ACS syndrome Aortic Dissection Pyelonephritis Renal stones  Pt comes in with  cc of abd pain. Recent dx of cholelithiasis. The pain is consistent with her previous attack. She is having persistent pain - although it is mild now compared to night time. Will repeat GI labs and [place]. She has a surgery f/u in Tuesday, if her pain continues to be tolerable and the [place] is neg for cholecystitis -will discharge.   Derwood Kaplan, MD 06/14/12 1613  6:29 PM Unchanged exam. [place] - no acute cholecystitis. Will d.c  Derwood Kaplan, MD 06/14/12 [phone]

## 2012-06-14 NOTE — ED Notes (Signed)
IV team at bed side to start IV. 

## 2012-06-14 NOTE — ED Notes (Signed)
C/O abdominal pain worsening since last night w/ n/v.  Was dx'd w/ gallstones about a month ago.  Was seen at [facility] hospital earlier today and treated for the pain and nausea and was told baby is fine.  Was told if she has continual pain to come to [facility].  Pt states the meds given at [facility] didn't help, just made her sleepy.

## 2012-06-14 NOTE — ED Notes (Signed)
Pt is [redacted] weeks pregnant. [facility] Baby is doing fine, pt told to come here if still having pain.

## 2012-06-15 ENCOUNTER — Telehealth: Payer: Self-pay | Admitting: *Deleted

## 2012-06-15 NOTE — Telephone Encounter (Signed)
Per [facility] patient in [facility] ER today- will call another time

## 2012-06-15 NOTE — Telephone Encounter (Signed)
Message copied by Gerome Apley on Mon Jun 15, 2012  2:17 PM ------      Message from: Aviva Signs      Created: Fri Jun 12, 2012  7:09 PM      Regarding: Needs to start Iron       Needs to start over the counter iron twice a day            Stop pica if possible            If she insists on Rx, can call in [facility] F 1 bid ------

## 2012-06-16 ENCOUNTER — Encounter (INDEPENDENT_AMBULATORY_CARE_PROVIDER_SITE_OTHER): Payer: Self-pay | Admitting: General Surgery

## 2012-06-16 ENCOUNTER — Ambulatory Visit (INDEPENDENT_AMBULATORY_CARE_PROVIDER_SITE_OTHER): Payer: Medicaid Other | Admitting: General Surgery

## 2012-06-16 VITALS — BP 126/80 | HR 106 | Temp 98.2°F | Resp 18 | Ht 61.0 in | Wt 220.0 lb

## 2012-06-16 DIAGNOSIS — K8020 Calculus of gallbladder without cholecystitis without obstruction: Secondary | ICD-10-CM

## 2012-06-16 DIAGNOSIS — O0001 Abdominal pregnancy with intrauterine pregnancy: Secondary | ICD-10-CM

## 2012-06-16 NOTE — Progress Notes (Signed)
Patient ID: Emma Chambers, female   DOB: 06/29/1990, 22 y.o.   MRN: [id number]  Chief Complaint  Patient presents with  . New Evaluation    Gallstones    HPI Emma Chambers is a 22 y.o. female.  This patient is referred from the [facility] clinic for evaluation of symptomatic cholelithiasis. She said that she had similar symptoms with the birth of her first child. Half ago and after the delivery of but it was never associated with gallstones until a few months ago. She has right-sided abdominal pain and sharp pains located under her right breast several times a day. This is required several emergency room visits as this is a been her only relief is with the IV pain medications. She has tried oral pain medications without any significant relief. She says that she has some associated nausea and occasional vomiting he has symptoms last about one hour. She had an ultrasound which confirmed cholelithiasis. She does have several LFTs recorded with an elevated alkaline phosphatase but bilirubin is normal and transaminases are normal. She is [redacted] weeks pregnant and her due date is on May 8.  HPI  Past Medical History  Diagnosis Date  . UTI (lower urinary tract infection)   . Hypertension     GHTN with last pg  . Pregnancy induced hypertension   . Headache   . Gallstones     No past surgical history on file.  Family History  Problem Relation Age of Onset  . Asthma Mother   . Hypertension Mother   . Parkinsonism Mother   . Diabetes Maternal Grandmother   . Hypertension Maternal Grandmother   . Stroke Maternal Grandmother   . Anesthesia problems Neg Hx   . Malignant hyperthermia Neg Hx   . Pseudochol deficiency Neg Hx   . Hypotension Neg Hx   . Asthma Brother     Social History History  Substance Use Topics  . Smoking status: Never Smoker   . Smokeless tobacco: Never Used  . Alcohol Use: No    Allergies  Allergen Reactions  . Ambien (Zolpidem Tartrate) Other (See Comments)   Caused her to have hallucinations  . Amoxicillin Nausea And Vomiting and Other (See Comments)    Tics & twitches  . Percocet (Oxycodone-Acetaminophen) Other (See Comments)    My arm was burning    Current Outpatient Prescriptions  Medication Sig Dispense Refill  . ondansetron (ZOFRAN ODT) 8 MG disintegrating tablet Take 1 tablet (8 mg total) by mouth every 8 (eight) hours as needed for nausea.  20 tablet  0  . ranitidine (ZANTAC) 150 MG tablet Take 150 mg by mouth 2 (two) times daily.       Marland Kitchen HYDROcodone-acetaminophen (VICODIN) 5-500 MG per tablet Take 1 tablet by mouth every 6 (six) hours as needed for pain.      . traMADol (ULTRAM) 50 MG tablet Take 50 mg by mouth every 6 (six) hours as needed for pain.       No current facility-administered medications for this visit.    Review of Systems Review of Systems All other review of systems negative or noncontributory except as stated in the HPI  Blood pressure 126/80, pulse 106, temperature 98.2 F (36.8 C), resp. rate 18, height 5\' 1"  (1.549 m), weight 220 lb (99.791 kg), last menstrual period 10/17/2011.  Physical Exam Physical Exam Physical Exam  Nursing note and vitals reviewed. Constitutional: She is oriented to person, place, and time. She appears well-developed and well-nourished. No distress.  HENT:  Head: Normocephalic and atraumatic.  Mouth/Throat: No oropharyngeal exudate.  Eyes: Conjunctivae and EOM are normal. Pupils are equal, round, and reactive to light. Right eye exhibits no discharge. Left eye exhibits no discharge. No scleral icterus.  Neck: Normal range of motion. Neck supple. No tracheal deviation present.  Cardiovascular: Normal rate, regular rhythm, normal heart sounds and intact distal pulses.   Pulmonary/Chest: Effort normal and breath sounds normal. No stridor. No respiratory distress. She has no wheezes.  Abdominal: Soft. Bowel sounds are normal. She exhibits no distension and no mass. There is mild RUQ  tenderness. There is no rebound and no guarding.  Musculoskeletal: Normal range of motion. She exhibits no edema and no tenderness.  Neurological: She is alert and oriented to person, place, and time.  Skin: Skin is warm and dry. No rash noted. She is not diaphoretic. No erythema. No pallor.  Psychiatric: She has a normal mood and affect. Her behavior is normal. Judgment and thought content normal.    Data Reviewed [place], HIDA, labs  Assessment    Systematic cholelithiasis and intrauterine pregnancy I agree that her symptoms are most likely due to symptomatic cholelithiasis. However, given the fact that she is so far along in her pregnancy I think that it would be best to wait for another month if possible in order to perform her surgery after the birth of her child. I discussed with her the possibility of premature delivery of an the need for open surgery if we were to do surgery at this time. We also discussed the risk of waiting including possible cholecystitis, pancreatitis and cholangitis. I think the best approach at this time would be to go ahead and schedule her for surgery about 2 weeks after her delivery of an hopefully at that time we will be able to perform a cholecystectomy. We did discuss the procedure and its risks.The risks of infection, bleeding, pain, persistent symptoms, scarring, injury to bowel or bile ducts, retained stone, diarrhea, need for additional procedures, and need for open surgery discussed with the patient.      Plan    We will plan for arthroscopic cholecystectomy with intraoperative cholangiogram after her delivery        Lodema Pilot DAVID 06/16/2012, 12:42 PM

## 2012-06-17 ENCOUNTER — Ambulatory Visit (INDEPENDENT_AMBULATORY_CARE_PROVIDER_SITE_OTHER): Payer: Medicaid Other | Admitting: Obstetrics and Gynecology

## 2012-06-17 ENCOUNTER — Encounter (HOSPITAL_COMMUNITY): Payer: Self-pay | Admitting: *Deleted

## 2012-06-17 ENCOUNTER — Inpatient Hospital Stay (HOSPITAL_COMMUNITY)
Admission: AD | Admit: 2012-06-17 | Discharge: 2012-06-17 | Disposition: A | Discharge status: Home / Self Care | Payer: Medicaid Other | Source: Ambulatory Visit | Attending: Obstetrics & Gynecology | Admitting: Obstetrics & Gynecology

## 2012-06-17 ENCOUNTER — Encounter: Payer: Self-pay | Admitting: Obstetrics & Gynecology

## 2012-06-17 VITALS — BP 122/80 | Temp 98.9°F | Wt 219.0 lb

## 2012-06-17 DIAGNOSIS — K8020 Calculus of gallbladder without cholecystitis without obstruction: Secondary | ICD-10-CM | POA: Insufficient documentation

## 2012-06-17 DIAGNOSIS — O9989 Other specified diseases and conditions complicating pregnancy, childbirth and the puerperium: Secondary | ICD-10-CM | POA: Insufficient documentation

## 2012-06-17 DIAGNOSIS — O26613 Liver and biliary tract disorders in pregnancy, third trimester: Secondary | ICD-10-CM

## 2012-06-17 DIAGNOSIS — R109 Unspecified abdominal pain: Secondary | ICD-10-CM | POA: Insufficient documentation

## 2012-06-17 DIAGNOSIS — K5900 Constipation, unspecified: Secondary | ICD-10-CM

## 2012-06-17 DIAGNOSIS — O26619 Liver and biliary tract disorders in pregnancy, unspecified trimester: Secondary | ICD-10-CM

## 2012-06-17 DIAGNOSIS — O219 Vomiting of pregnancy, unspecified: Secondary | ICD-10-CM

## 2012-06-17 DIAGNOSIS — O212 Late vomiting of pregnancy: Secondary | ICD-10-CM | POA: Insufficient documentation

## 2012-06-17 LAB — CBC WITH DIFFERENTIAL/PLATELET
Basophils Absolute: 0 10*3/uL (ref 0.0–0.1)
Basophils Relative: 0 % (ref 0–1)
Eosinophils Absolute: 0.2 10*3/uL (ref 0.0–0.7)
Eosinophils Relative: 2 % (ref 0–5)
HCT: 30.1 % — ABNORMAL LOW (ref 36.0–46.0)
Hemoglobin: 9.6 g/dL — ABNORMAL LOW (ref 12.0–15.0)
Lymphocytes Relative: 22 % (ref 12–46)
Lymphs Abs: 2.3 10*3/uL (ref 0.7–4.0)
MCH: 23.5 pg — ABNORMAL LOW (ref 26.0–34.0)
MCHC: 31.9 g/dL (ref 30.0–36.0)
MCV: 73.6 fL — ABNORMAL LOW (ref 78.0–100.0)
Monocytes Absolute: 0.5 10*3/uL (ref 0.1–1.0)
Monocytes Relative: 5 % (ref 3–12)
Neutro Abs: 7.2 10*3/uL (ref 1.7–7.7)
Neutrophils Relative %: 71 % (ref 43–77)
Platelets: 275 10*3/uL (ref 150–400)
RBC: 4.09 MIL/uL (ref 3.87–5.11)
RDW: 16 % — ABNORMAL HIGH (ref 11.5–15.5)
WBC: 10.2 10*3/uL (ref 4.0–10.5)

## 2012-06-17 LAB — COMPREHENSIVE METABOLIC PANEL
ALT: 5 U/L (ref 0–35)
AST: 16 U/L (ref 0–37)
Albumin: 2.9 g/dL — ABNORMAL LOW (ref 3.5–5.2)
Alkaline Phosphatase: 171 U/L — ABNORMAL HIGH (ref 39–117)
BUN: 5 mg/dL — ABNORMAL LOW (ref 6–23)
CO2: 22 mEq/L (ref 19–32)
Calcium: 9.1 mg/dL (ref 8.4–10.5)
Chloride: 103 mEq/L (ref 96–112)
Creatinine, Ser: 0.46 mg/dL — ABNORMAL LOW (ref 0.50–1.10)
GFR calc Af Amer: >90 mL/min (ref >90)
GFR calc non Af Amer: >90 mL/min (ref >90)
Glucose, Bld: 79 mg/dL (ref 70–99)
Potassium: 3.7 mEq/L (ref 3.5–5.1)
Sodium: 134 mEq/L — ABNORMAL LOW (ref 135–145)
Total Bilirubin: 0.2 mg/dL — ABNORMAL LOW (ref 0.3–1.2)
Total Protein: 6.8 g/dL (ref 6.0–8.3)

## 2012-06-17 LAB — POCT URINALYSIS DIP (DEVICE)
Bilirubin Urine: NEGATIVE
Glucose, UA: NEGATIVE mg/dL
Hgb urine dipstick: NEGATIVE
Ketones, ur: NEGATIVE mg/dL
Nitrite: NEGATIVE
Protein, ur: 30 mg/dL — AB
Specific Gravity, Urine: >=1.03 (ref 1.005–1.030)
Urobilinogen, UA: 1 mg/dL (ref 0.0–1.0)
pH: 6.5 (ref 5.0–8.0)

## 2012-06-17 MED ORDER — OXYCODONE-ACETAMINOPHEN 5-325 MG/5ML PO SOLN: 5.0000 mL | ORAL | Status: DC | PRN

## 2012-06-17 MED ORDER — INTEGRA 62.5-62.5-40-3 MG PO CAPS: 1.0000 | ORAL_CAPSULE | Freq: Every day | ORAL | Status: DC

## 2012-06-17 MED ORDER — HYDROMORPHONE HCL PF 2 MG/ML IJ SOLN
2.0000 mg | Freq: Once | INTRAMUSCULAR | Status: AC
  Administered 2012-06-17: 2 mg via INTRAMUSCULAR
  Filled 2012-06-17: qty 1

## 2012-06-17 MED ORDER — DOCUSATE SODIUM 100 MG PO CAPS: 100.0000 mg | ORAL_CAPSULE | Freq: Two times a day (BID) | ORAL | Status: DC | PRN

## 2012-06-17 MED ORDER — PROMETHAZINE HCL 25 MG RE SUPP: 25.0000 mg | Freq: Four times a day (QID) | RECTAL | Status: DC | PRN

## 2012-06-17 NOTE — Progress Notes (Signed)
Pulse 121 Edema trace in feet.  C/o of increasing stabbing pain on right side of abdomen. Vaginal d/c as moderate amount mucous blood yesterday.

## 2012-06-17 NOTE — Telephone Encounter (Signed)
Pt is currently being seen in MAU for abd pain. I contacted Sharen Counter CNM and she will inform pt of recommendation for iron BID.

## 2012-06-17 NOTE — MAU Provider Note (Signed)
Attestation of Attending Supervision of Advanced Practitioner (CNM/NP): Evaluation and management procedures were performed by the Advanced Practitioner under my supervision and collaboration. I have reviewed the Advanced Practitioner's note and chart, and I agree with the management and plan.  LEGGETT,KELLY H. 11:31 AM

## 2012-06-17 NOTE — MAU Provider Note (Signed)
  History     CSN: [id number]  Arrival date and time: 06/17/12 1047   First Provider Initiated Contact with Patient 06/17/12 1209      Chief Complaint  Patient presents with  . Abdominal Pain   HPI  Pt is a G2P1001 at 34.6 wks IUP here with report of abdominal pain that feels intermittent in nature.  Pt states uncertain if pain is from contractions or gallstones.  Reports pain is a 6/10.  Difficulty taking PO pills and was inquiring if induction is possible.  Appt with GI doctor yesterday who reported that surgery could be performed two weeks after delivery (see office note 06/16/12).  +nausea and vomiting x 3 days.  No fever, body aches or chills.    Past Medical History  Diagnosis Date  . UTI (lower urinary tract infection)   . Hypertension     GHTN with last pg  . Pregnancy induced hypertension   . Headache   . Gallstones     History reviewed. No pertinent past surgical history.  Family History  Problem Relation Age of Onset  . Asthma Mother   . Hypertension Mother   . Parkinsonism Mother   . Diabetes Maternal Grandmother   . Hypertension Maternal Grandmother   . Stroke Maternal Grandmother   . Anesthesia problems Neg Hx   . Malignant hyperthermia Neg Hx   . Pseudochol deficiency Neg Hx   . Hypotension Neg Hx   . Asthma Brother     History  Substance Use Topics  . Smoking status: Never Smoker   . Smokeless tobacco: Never Used  . Alcohol Use: No    Allergies:  Allergies  Allergen Reactions  . Ambien (Zolpidem Tartrate) Other (See Comments)    Caused her to have hallucinations  . Amoxicillin Nausea And Vomiting and Other (See Comments)    Tics & twitches  . Percocet (Oxycodone-Acetaminophen) Other (See Comments)    My arm was burning    Prescriptions prior to admission  Medication Sig Dispense Refill  . acetaminophen (TYLENOL) 500 MG tablet Take 1,000 mg by mouth every 6 (six) hours as needed for pain.      . Menthol, Topical Analgesic, (ICY HOT PAIN  RELIEVING EX) Apply 1 application topically daily as needed (for back pain).      . ondansetron (ZOFRAN ODT) 8 MG disintegrating tablet Take 1 tablet (8 mg total) by mouth every 8 (eight) hours as needed for nausea.  20 tablet  0  . ranitidine (ZANTAC) 150 MG tablet Take 150 mg by mouth 2 (two) times daily.         Review of Systems  Constitutional: Negative.   Gastrointestinal: Positive for nausea, vomiting and abdominal pain.  Genitourinary: Negative.   All other systems reviewed and are negative.   Physical Exam   Blood pressure 124/69, pulse 110, temperature 98.9 F (37.2 C), resp. rate 18, last menstrual period 10/17/2011.  Physical Exam  MAU Course  Procedures   Assessment and Plan  Sharen Counter given report and assumes care of patient.  [facility] 06/17/2012, 12:14 PM

## 2012-06-17 NOTE — Progress Notes (Signed)
Very anxious for IOL d/t pain she attributes to her cholelithiasis. See surgery note from yesterday. Will not take pain meds as they don't help and it makes her "loopy". Diffuse abd pain, esp RUQ, LBP, pelvic pain, H/As,vomiting a lot. Zofran and Phenergan not helping. Declines Reglan. Constipation discussed. Had steroids earlier.

## 2012-06-17 NOTE — MAU Note (Signed)
Pt was sent up from the clinic for pain with gallstones. States the pain pills she has does not work at all.

## 2012-06-17 NOTE — Patient Instructions (Signed)
Cholelithiasis Cholelithiasis (also called gallstones) is a form of gallbladder disease where gallstones form in your gallbladder. The gallbladder is a non-essential organ that stores bile made in the liver, which helps digest fats. Gallstones begin as small crystals and slowly grow into stones. Gallstone pain occurs when the gallbladder spasms, and a gallstone is blocking the duct. Pain can also occur when a stone passes out of the duct.  Women are more likely to develop gallstones than men. Other factors that increase the risk of gallbladder disease are:  Having multiple pregnancies. Physicians sometimes advise removing diseased gallbladders before future pregnancies.  Obesity.  Diets heavy in fried foods and fat.  Increasing age (older than 60).  Prolonged use of medications containing female hormones.  Diabetes mellitus.  Rapid weight loss.  Family history of gallstones (heredity). SYMPTOMS  Feeling sick to your stomach (nauseous).  Abdominal pain.  Yellowing of the skin (jaundice).  Sudden pain. It may persist from several minutes to several hours.  Worsening pain with deep breathing or when jarred.  Fever.  Tenderness to the touch. In some cases, when gallstones do not move into the bile duct, people have no pain or symptoms. These are called "silent" gallstones. TREATMENT In severe cases, emergency surgery may be required. HOME CARE INSTRUCTIONS   Only take over-the-counter or prescription medicines for pain, discomfort, or fever as directed by your caregiver.  Follow a low-fat diet until seen again. Fat causes the gallbladder to contract, which can result in pain.  Follow up as instructed. Attacks are almost always recurrent and surgery is usually required for permanent treatment. SEEK IMMEDIATE MEDICAL CARE IF:   Your pain increases and is not controlled by medications.  You have an oral temperature above 102 F (38.9 C), not controlled by medication.  You  develop nausea and vomiting. MAKE SURE YOU:   Understand these instructions.  Will watch your condition.  Will get help right away if you are not doing well or get worse. Document Released: 02/28/2005 Document Revised: 05/27/2011 Document Reviewed: 05/03/2010 [employer] Patient Information 2013 [employer].  

## 2012-06-18 ENCOUNTER — Telehealth: Payer: Self-pay | Admitting: *Deleted

## 2012-06-18 ENCOUNTER — Inpatient Hospital Stay (HOSPITAL_COMMUNITY)
Admission: AD | Admit: 2012-06-18 | Discharge: 2012-06-19 | Disposition: A | Discharge status: Home / Self Care | Payer: Medicaid Other | Source: Ambulatory Visit | Attending: Obstetrics & Gynecology | Admitting: Obstetrics & Gynecology

## 2012-06-18 ENCOUNTER — Encounter (HOSPITAL_COMMUNITY): Payer: Self-pay | Admitting: *Deleted

## 2012-06-18 DIAGNOSIS — O9989 Other specified diseases and conditions complicating pregnancy, childbirth and the puerperium: Secondary | ICD-10-CM | POA: Insufficient documentation

## 2012-06-18 DIAGNOSIS — O368190 Decreased fetal movements, unspecified trimester, not applicable or unspecified: Secondary | ICD-10-CM | POA: Insufficient documentation

## 2012-06-18 DIAGNOSIS — O212 Late vomiting of pregnancy: Secondary | ICD-10-CM | POA: Insufficient documentation

## 2012-06-18 DIAGNOSIS — K8020 Calculus of gallbladder without cholecystitis without obstruction: Secondary | ICD-10-CM | POA: Insufficient documentation

## 2012-06-18 LAB — URINE MICROSCOPIC-ADD ON

## 2012-06-18 LAB — CBC WITH DIFFERENTIAL/PLATELET
Basophils Absolute: 0 10*3/uL (ref 0.0–0.1)
Basophils Relative: 0 % (ref 0–1)
Eosinophils Absolute: 0.1 10*3/uL (ref 0.0–0.7)
Eosinophils Relative: 1 % (ref 0–5)
HCT: 29.3 % — ABNORMAL LOW (ref 36.0–46.0)
Hemoglobin: 9.5 g/dL — ABNORMAL LOW (ref 12.0–15.0)
Lymphocytes Relative: 21 % (ref 12–46)
Lymphs Abs: 2.2 10*3/uL (ref 0.7–4.0)
MCH: 23.8 pg — ABNORMAL LOW (ref 26.0–34.0)
MCHC: 32.4 g/dL (ref 30.0–36.0)
MCV: 73.4 fL — ABNORMAL LOW (ref 78.0–100.0)
Monocytes Absolute: 0.5 10*3/uL (ref 0.1–1.0)
Monocytes Relative: 5 % (ref 3–12)
Neutro Abs: 7.7 10*3/uL (ref 1.7–7.7)
Neutrophils Relative %: 73 % (ref 43–77)
Platelets: 261 10*3/uL (ref 150–400)
RBC: 3.99 MIL/uL (ref 3.87–5.11)
RDW: 16 % — ABNORMAL HIGH (ref 11.5–15.5)
WBC: 10.6 10*3/uL — ABNORMAL HIGH (ref 4.0–10.5)

## 2012-06-18 LAB — WET PREP, GENITAL
Clue Cells Wet Prep HPF POC: NONE SEEN
Trich, Wet Prep: NONE SEEN
Yeast Wet Prep HPF POC: NONE SEEN

## 2012-06-18 LAB — COMPREHENSIVE METABOLIC PANEL
ALT: 6 U/L (ref 0–35)
AST: 16 U/L (ref 0–37)
Albumin: 2.8 g/dL — ABNORMAL LOW (ref 3.5–5.2)
Alkaline Phosphatase: 169 U/L — ABNORMAL HIGH (ref 39–117)
BUN: 5 mg/dL — ABNORMAL LOW (ref 6–23)
CO2: 22 mEq/L (ref 19–32)
Calcium: 8.8 mg/dL (ref 8.4–10.5)
Chloride: 102 mEq/L (ref 96–112)
Creatinine, Ser: 0.42 mg/dL — ABNORMAL LOW (ref 0.50–1.10)
GFR calc Af Amer: >90 mL/min (ref >90)
GFR calc non Af Amer: >90 mL/min (ref >90)
Glucose, Bld: 79 mg/dL (ref 70–99)
Potassium: 3.2 mEq/L — ABNORMAL LOW (ref 3.5–5.1)
Sodium: 132 mEq/L — ABNORMAL LOW (ref 135–145)
Total Bilirubin: 0.3 mg/dL (ref 0.3–1.2)
Total Protein: 6.5 g/dL (ref 6.0–8.3)

## 2012-06-18 LAB — LIPASE, BLOOD: Lipase: 26 U/L (ref 11–59)

## 2012-06-18 LAB — URINALYSIS, ROUTINE W REFLEX MICROSCOPIC
Bilirubin Urine: NEGATIVE
Glucose, UA: NEGATIVE mg/dL
Hgb urine dipstick: NEGATIVE
Ketones, ur: 40 mg/dL — AB
Nitrite: NEGATIVE
Protein, ur: 30 mg/dL — AB
Specific Gravity, Urine: 1.025 (ref 1.005–1.030)
Urobilinogen, UA: 1 mg/dL (ref 0.0–1.0)
pH: 7 (ref 5.0–8.0)

## 2012-06-18 MED ORDER — PROMETHAZINE HCL 25 MG/ML IJ SOLN
12.5000 mg | Freq: Four times a day (QID) | INTRAMUSCULAR | Status: DC | PRN
  Administered 2012-06-18: 12.5 mg via INTRAVENOUS
  Filled 2012-06-18 (×2): qty 1

## 2012-06-18 MED ORDER — LACTATED RINGERS IV BOLUS (SEPSIS)
1000.0000 mL | Freq: Once | INTRAVENOUS | Status: AC
  Administered 2012-06-18: 1000 mL via INTRAVENOUS

## 2012-06-18 MED ORDER — LACTATED RINGERS IV SOLN
INTRAVENOUS | Status: DC
  Administered 2012-06-18: 20:00:00 via INTRAVENOUS

## 2012-06-18 MED ORDER — ONDANSETRON HCL 4 MG/2ML IJ SOLN
4.0000 mg | INTRAMUSCULAR | Status: DC | PRN
  Administered 2012-06-18: 4 mg via INTRAVENOUS
  Filled 2012-06-18: qty 2

## 2012-06-18 MED ORDER — METOCLOPRAMIDE HCL 5 MG/ML IJ SOLN
10.0000 mg | Freq: Once | INTRAMUSCULAR | Status: AC
  Administered 2012-06-18: 10 mg via INTRAVENOUS
  Filled 2012-06-18: qty 2

## 2012-06-18 MED ORDER — POTASSIUM CHLORIDE IN NACL 40-0.9 MEQ/L-% IV SOLN: INTRAVENOUS | Status: DC

## 2012-06-18 MED ORDER — SODIUM CHLORIDE 0.9 % IJ SOLN
INTRAMUSCULAR | Status: AC
  Filled 2012-06-18: qty 3

## 2012-06-18 MED ORDER — ONDANSETRON HCL 4 MG/2ML IJ SOLN
4.0000 mg | Freq: Four times a day (QID) | INTRAMUSCULAR | Status: DC | PRN
  Administered 2012-06-18: 4 mg via INTRAVENOUS
  Filled 2012-06-18: qty 2

## 2012-06-18 MED ORDER — HYDROMORPHONE HCL PF 1 MG/ML IJ SOLN
1.0000 mg | Freq: Once | INTRAMUSCULAR | Status: AC
  Administered 2012-06-18: 1 mg via INTRAVENOUS
  Filled 2012-06-18: qty 1

## 2012-06-18 MED ORDER — POTASSIUM CHLORIDE 2 MEQ/ML IV SOLN
Freq: Once | INTRAVENOUS | Status: AC
  Administered 2012-06-18: 22:00:00 via INTRAVENOUS
  Filled 2012-06-18: qty 1000

## 2012-06-18 NOTE — MAU Note (Signed)
Monitors D/C. 

## 2012-06-18 NOTE — Telephone Encounter (Signed)
Emma Chambers called and left a message on the phone stating she needs some advice before she comes in- states she has been having uncontrollable  Vomiting since yesterday and sometimes a popping pain in her stomach and waist area- wants to know what to do, doesn't just want to come in and be sent home " like every other time". Per chart review was last seen yesterday for routine prenatal visit then seen in MAU. Has gallstones and has seen surgeon and plan is to try to wait and do surgery after delivery.  Called [place] and she states she has been throwing up uncontrollable since she was seen yesterday and can't keep anything. States she can't deal with this pain anymore and she takes the pain med but it makes her loopy and she can't be loopy and take care of her child.  States she is lightheaded and can't keep anything down. Instructed patient to come to MAU for evaluation. Also instructed her that the doctors /surgeons would have to decide when to do the surgery and they were trying to wait until after delivery. Also informed her she needs evaluation and may need IV fluids. Patient agrees to come to MAU today asap. Also called MAU charge nurse to notify patient coming.

## 2012-06-18 NOTE — H&P (Signed)
History     CSN: [id number]  Arrival date & time 06/18/12  1406   None     Chief Complaint  Patient presents with  . Emesis  . Decreased Fetal Movement    HPI  Emma Chambers is a 22 y.o. G2P1001 at [redacted]w[redacted]d who presents complaining of vomiting and abdominal pain. She was seen here in the MAU yesterday for pain and was given 2mg  of dilaudid IM, had unremarkable labs, and was discharged. Last night she started vomiting and has vomited "15-20" times today. She has pain both in her RUQ and "all around in" her stomach.  Her pain comes and goes but she says it is "one hundred" at its worst. She does not think she is having contractions.  This morning her daughter fell on her abdomen around 8am which hurt. Her abdomen has hurt worse since then. She is also feeling pressure in her pelvic area "like something is going to come out". Experiencing a popping type noise and squeezing sensation in her bilatera lower pelvis and some pain in her mid back.  Denies bleeding today (did have some light bleeding yesterday), also denies having any fluid leaking. Has had creamy white vaginal discharge that was without odor. She has not felt the baby move since yesterday. She denies diarrhea. She says she had a fever of "one hundred and something" but is not able to recall the actual number.   Pt has known gallstones and per chart review, was admitted just a few weeks ago. She got two doses of betamethasone 12mg , on 3/14 and 3/15. General surgery is planning to perform a cholecystectomy two weeks after she delivers. Pt endorses having pain for one month. This is the fifth ER visit she has had for abdominal pain since her discharge on 3/16.  She also tells the nurse she is having a 10/10 headache (she did not tell me this). She also says her leg was twitching/locking up on her on the back of her calf earlier.  Past Medical History  Diagnosis Date  . UTI (lower urinary tract infection)   . Hypertension     GHTN  with last pg  . Pregnancy induced hypertension   . Headache   . Gallstones     Past Surgical History  Procedure Laterality Date  . No past surgeries      Family History  Problem Relation Age of Onset  . Asthma Mother   . Hypertension Mother   . Parkinsonism Mother   . Diabetes Maternal Grandmother   . Hypertension Maternal Grandmother   . Stroke Maternal Grandmother   . Anesthesia problems Neg Hx   . Malignant hyperthermia Neg Hx   . Pseudochol deficiency Neg Hx   . Hypotension Neg Hx   . Asthma Brother     History  Substance Use Topics  . Smoking status: Never Smoker   . Smokeless tobacco: Never Used  . Alcohol Use: No  Denies using cigarettes, alcohol, or drugs.   OB History   Grav Para Term Preterm Abortions TAB SAB Ect Mult Living   2 1 1  0 0 0 0 0 0 1    Her prior pregnancy was born full term, vaginal delivery. No problems during that pregnancy but patient says she doesn't remember the delivery because she was on medicine that made her "out of it". That baby was born here.  Review of Systems  Allergies  Ambien; Amoxicillin; and Percocet  ambien - altered mental status/"talking out of my head"  Amoxicillin - unknown? Percocet - loopy One medicine causes twitching, jerking, burning but she doesn't know which one  Home Medications  No current outpatient prescriptions on file.  BP 112/69  Pulse 138  Temp(Src) 99.1 F (37.3 C) (Oral)  Resp 18  LMP 10/17/2011  Physical Exam Gen: NAD HEENT: normocephalic, atraumatic, mouth slightly dry Heart: slightly tachycardic, regular rhythm Lungs: CTAB,NWOB Abd: gravid. Nondistended. Mildly tender to palpation in epigastric area and in RLQ. Ext: no pitting edema in either lower extremity Neuro: grossly nonfocal GU: normal appearing external genitalia. Sterile speculum exam shows moderate to large amounts of white thick discharge in vagina. Dilation: 2 Effacement (%): 70 Exam by:: B. Mcintyre/ M. [name]  RN   MAU Course  Procedures (including critical care time)  Labs Reviewed  WET PREP, GENITAL - Abnormal; Notable for the following:    WBC, Wet Prep HPF POC FEW (*)    All other components within normal limits  URINALYSIS, ROUTINE W REFLEX MICROSCOPIC - Abnormal; Notable for the following:    APPearance CLOUDY (*)    Ketones, ur 40 (*)    Protein, ur 30 (*)    Leukocytes, UA LARGE (*)    All other components within normal limits  URINE MICROSCOPIC-ADD ON - Abnormal; Notable for the following:    Squamous Epithelial / LPF MANY (*)    Bacteria, UA MANY (*)    All other components within normal limits  CBC WITH DIFFERENTIAL - Abnormal; Notable for the following:    WBC 10.6 (*)    Hemoglobin 9.5 (*)    HCT 29.3 (*)    MCV 73.4 (*)    MCH 23.8 (*)    RDW 16.0 (*)    All other components within normal limits  COMPREHENSIVE METABOLIC PANEL - Abnormal; Notable for the following:    Sodium 132 (*)    Potassium 3.2 (*)    BUN 5 (*)    Creatinine, Ser 0.42 (*)    Albumin 2.8 (*)    Alkaline Phosphatase 169 (*)    All other components within normal limits  URINE CULTURE  GC/CHLAMYDIA PROBE AMP  LIPASE, BLOOD   No results found.   No diagnosis found.  FHR: baseline varies from 130s-160s, moderate variability, accelerations present, no decels noted Toco: no ctx seen  MDM  Emma Chambers is a 22 y.o. G2P1001 at [redacted]w[redacted]d with known cholelithiasis who presents with vomiting and generalized abdominal pain, which sometimes localizes to the epigastrum and RUQ. Reports decreased fetal movement but tracing is very reassuring. Despite patient's complaint of severe pain, she appears relatively comfortable. Has vomited here in the MAU.  Plan: -hydrate with LR bolus -check gc/chlamydia, wet prep, UA, urine micro, urine cx -check CMET, CBC, lipase -IV dilaudid for pain control -zofran and/or phenergan for nausea control  Update: K low at 3.2 - will add K to fluids No other remarkable  lab abnormalities  Levert Feinstein, MD PGY-1  ----------------------------------------------------------------------------- Discharge Addendum 12:33 AM  Pt evaluated and examined.  Pt endorses continued but improved nausea and emesis. Also w/ mildly improved pain. Pain medications worsen nausea. Pt desiring to go home. Additional therapies were recommended including Unisom and Vitamin B6 but the pt declined. Pt understands the difficulty of treating her condition as she is both pregnant (35+ wks) and has a recurrent pain from gall stones. Pt tries to stick to a diet at home that limits her exacerbations but has not paid close attention to all of her possible food triggers.  Precautions reviewed w/ pt.  Pt has received approximately KCL IV and given Kdur prior to DC.   Stable for discharge at this time.  Shelly Flatten, MD Family Medicine PGY-2 06/19/2012, 12:37 AM

## 2012-06-18 NOTE — MAU Note (Signed)
Patient has known gallstones. States she has had continuous vomiting and has not felt the baby move.

## 2012-06-19 DIAGNOSIS — O368190 Decreased fetal movements, unspecified trimester, not applicable or unspecified: Secondary | ICD-10-CM

## 2012-06-19 DIAGNOSIS — O212 Late vomiting of pregnancy: Secondary | ICD-10-CM

## 2012-06-19 LAB — GC/CHLAMYDIA PROBE AMP
CT Probe RNA: NEGATIVE
GC Probe RNA: NEGATIVE

## 2012-06-19 MED ORDER — POTASSIUM CHLORIDE CRYS ER 20 MEQ PO TBCR
40.0000 meq | EXTENDED_RELEASE_TABLET | Freq: Two times a day (BID) | ORAL | Status: DC
  Administered 2012-06-19: 40 meq via ORAL
  Filled 2012-06-19 (×2): qty 2

## 2012-06-19 NOTE — H&P (Signed)
Attestation of Attending Supervision of Advanced Practitioner (CNM/NP): Evaluation and management procedures were performed by the Advanced Practitioner under my supervision and collaboration.  I have reviewed the Advanced Practitioner's note and chart, and I agree with the management and plan.  HARRAWAY-SMITH, [place] Divis 7:01 AM     

## 2012-06-20 LAB — URINE CULTURE: Colony Count: 45000

## 2012-06-21 ENCOUNTER — Inpatient Hospital Stay (HOSPITAL_COMMUNITY)
Admission: AD | Admit: 2012-06-21 | Discharge: 2012-06-21 | Disposition: A | Discharge status: Home / Self Care | Payer: Medicaid Other | Source: Ambulatory Visit | Attending: Obstetrics & Gynecology | Admitting: Obstetrics & Gynecology

## 2012-06-21 DIAGNOSIS — R1084 Generalized abdominal pain: Secondary | ICD-10-CM

## 2012-06-21 DIAGNOSIS — N898 Other specified noninflammatory disorders of vagina: Secondary | ICD-10-CM

## 2012-06-21 DIAGNOSIS — O99891 Other specified diseases and conditions complicating pregnancy: Secondary | ICD-10-CM | POA: Insufficient documentation

## 2012-06-21 DIAGNOSIS — O9989 Other specified diseases and conditions complicating pregnancy, childbirth and the puerperium: Secondary | ICD-10-CM

## 2012-06-21 DIAGNOSIS — O26899 Other specified pregnancy related conditions, unspecified trimester: Secondary | ICD-10-CM

## 2012-06-21 DIAGNOSIS — O26893 Other specified pregnancy related conditions, third trimester: Secondary | ICD-10-CM

## 2012-06-21 NOTE — MAU Note (Signed)
Pt reports gush of fluid after urinating. Then another gush of fluid later. Pt also feeling contractions. Pt states that she took oxycodone for abdominal pain @ 1700.

## 2012-06-21 NOTE — MAU Provider Note (Signed)
Attestation of Attending Supervision of Advanced Practitioner (PA/CNM/NP): Evaluation and management procedures were performed by the Advanced Practitioner under my supervision and collaboration.  I have reviewed the Advanced Practitioner's note and chart, and I agree with the management and plan.  Makeda Peeks, MD, FACOG Attending Obstetrician & Gynecologist Faculty Practice, [facility]  

## 2012-06-21 NOTE — MAU Provider Note (Signed)
History     CSN: [id number]  Arrival date and time: 06/21/12 2033   None     Chief Complaint  Patient presents with  . Labor Eval  . Rupture of Membranes   HPI This is a 22 y.o. female at [redacted]w[redacted]d who presents with report of gushes of fluid this evening.  States has just urinated, stood up and a little fluid trickled out. At home the same thing happened, both just after urination. Has some irregular contractions.  RN Note: Pt reports gush of fluid after urinating. Then another gush of fluid later. Pt also feeling contractions. Pt states that she took oxycodone for abdominal pain @ 1700.      OB History   Grav Para Term Preterm Abortions TAB SAB Ect Mult Living   2 1 1  0 0 0 0 0 0 1      Past Medical History  Diagnosis Date  . UTI (lower urinary tract infection)   . Hypertension     GHTN with last pg  . Pregnancy induced hypertension   . Headache   . Gallstones     Past Surgical History  Procedure Laterality Date  . No past surgeries      Family History  Problem Relation Age of Onset  . Asthma Mother   . Hypertension Mother   . Parkinsonism Mother   . Diabetes Maternal Grandmother   . Hypertension Maternal Grandmother   . Stroke Maternal Grandmother   . Anesthesia problems Neg Hx   . Malignant hyperthermia Neg Hx   . Pseudochol deficiency Neg Hx   . Hypotension Neg Hx   . Asthma Brother     History  Substance Use Topics  . Smoking status: Never Smoker   . Smokeless tobacco: Never Used  . Alcohol Use: No    Allergies:  Allergies  Allergen Reactions  . Ambien (Zolpidem Tartrate) Other (See Comments)    Caused her to have hallucinations  . Amoxicillin Nausea And Vomiting and Other (See Comments)    Tics & twitches  . Percocet (Oxycodone-Acetaminophen) Other (See Comments)    My arm was burning    Prescriptions prior to admission  Medication Sig Dispense Refill  . acetaminophen (TYLENOL) 500 MG tablet Take 1,000 mg by mouth every 6 (six) hours as  needed for pain.      Marland Kitchen docusate sodium (COLACE) 100 MG capsule Take 1 capsule (100 mg total) by mouth 2 (two) times daily as needed for constipation.  30 capsule  2  . Menthol, Topical Analgesic, (ICY HOT PAIN RELIEVING EX) Apply 1 application topically daily as needed (for back pain).      Marland Kitchen oxyCODONE-acetaminophen (ROXICET) 5-325 MG/5ML solution Take 5-10 mLs by mouth every 4 (four) hours as needed for pain.  500 mL  0  . ranitidine (ZANTAC) 150 MG tablet Take 150 mg by mouth 2 (two) times daily.       . Fe Fum-FePoly-Vit C-Vit B3 (INTEGRA) 62.5-62.5-40-3 MG CAPS Take 1 capsule by mouth daily.  30 capsule  5  . promethazine (PHENERGAN) 25 MG suppository Place 1 suppository (25 mg total) rectally every 6 (six) hours as needed for nausea.  12 each  3    Review of Systems  Constitutional: Negative for fever, chills and malaise/fatigue.  Gastrointestinal: Negative for nausea, vomiting, abdominal pain and diarrhea.  Genitourinary: Negative for dysuria.  Neurological: Negative for dizziness.   Physical Exam   Blood pressure 107/74, pulse 89, temperature 98.2 F (36.8 C), temperature source  Oral, resp. rate 16, height 5\' 1"  (1.549 m), weight 220 lb (99.791 kg), last menstrual period 10/17/2011.  Physical Exam  Constitutional: She is oriented to person, place, and time. She appears well-developed and well-nourished. No distress.  HENT:  Head: Normocephalic.  Cardiovascular: Normal rate.   Respiratory: Effort normal.  GI: Soft. She exhibits no distension and no mass. There is no tenderness. There is no rebound and no guarding.  Genitourinary: Vagina normal and uterus normal. No vaginal discharge found.  NO pooling No ferning  Musculoskeletal: Normal range of motion.  Neurological: She is alert and oriented to person, place, and time.  Skin: Skin is warm and dry.  Psychiatric: She has a normal mood and affect.   Fetal heart rate reactive Occasional contractions  MAU Course   Procedures   Assessment and Plan  A:  SIUP at [redacted]w[redacted]d       Probable urine leakage     No evidence of ROM  P:  Discharge home      Keep appt as scheduled      Labor precautions  [facility] 06/21/2012, 9:16 PM

## 2012-06-22 NOTE — MAU Provider Note (Signed)
Attestation of Attending Supervision of Advanced Practitioner (PA/CNM/NP): Evaluation and management procedures were performed by the Advanced Practitioner under my supervision and collaboration.  I have reviewed the Advanced Practitioner's note and chart, and I agree with the management and plan.  Norva Bowe, MD, FACOG Attending Obstetrician & Gynecologist Faculty Practice, [facility]  

## 2012-06-24 ENCOUNTER — Ambulatory Visit (INDEPENDENT_AMBULATORY_CARE_PROVIDER_SITE_OTHER): Payer: Medicaid Other | Admitting: Advanced Practice Midwife

## 2012-06-24 ENCOUNTER — Inpatient Hospital Stay (HOSPITAL_COMMUNITY)
Admission: AD | Admit: 2012-06-24 | Discharge: 2012-06-24 | Disposition: A | Discharge status: Home / Self Care | Payer: Medicaid Other | Source: Ambulatory Visit | Attending: Obstetrics & Gynecology | Admitting: Obstetrics & Gynecology

## 2012-06-24 ENCOUNTER — Encounter (HOSPITAL_COMMUNITY): Payer: Self-pay | Admitting: General Practice

## 2012-06-24 ENCOUNTER — Other Ambulatory Visit: Payer: Self-pay | Admitting: Family Medicine

## 2012-06-24 VITALS — BP 120/72 | Temp 98.8°F | Wt 218.0 lb

## 2012-06-24 DIAGNOSIS — O9989 Other specified diseases and conditions complicating pregnancy, childbirth and the puerperium: Secondary | ICD-10-CM

## 2012-06-24 DIAGNOSIS — O26899 Other specified pregnancy related conditions, unspecified trimester: Secondary | ICD-10-CM

## 2012-06-24 DIAGNOSIS — R12 Heartburn: Secondary | ICD-10-CM | POA: Insufficient documentation

## 2012-06-24 DIAGNOSIS — K8020 Calculus of gallbladder without cholecystitis without obstruction: Secondary | ICD-10-CM | POA: Insufficient documentation

## 2012-06-24 DIAGNOSIS — R109 Unspecified abdominal pain: Secondary | ICD-10-CM | POA: Insufficient documentation

## 2012-06-24 DIAGNOSIS — E860 Dehydration: Secondary | ICD-10-CM | POA: Insufficient documentation

## 2012-06-24 DIAGNOSIS — O212 Late vomiting of pregnancy: Secondary | ICD-10-CM | POA: Insufficient documentation

## 2012-06-24 DIAGNOSIS — K219 Gastro-esophageal reflux disease without esophagitis: Secondary | ICD-10-CM | POA: Insufficient documentation

## 2012-06-24 LAB — URINALYSIS, ROUTINE W REFLEX MICROSCOPIC
Bilirubin Urine: NEGATIVE
Glucose, UA: 250 mg/dL — AB
Hgb urine dipstick: NEGATIVE
Ketones, ur: 40 mg/dL — AB
Leukocytes, UA: NEGATIVE
Nitrite: NEGATIVE
Protein, ur: NEGATIVE mg/dL
Specific Gravity, Urine: >1.03 — ABNORMAL HIGH (ref 1.005–1.030)
Urobilinogen, UA: 0.2 mg/dL (ref 0.0–1.0)
pH: 6 (ref 5.0–8.0)

## 2012-06-24 LAB — CBC
HCT: 29.1 % — ABNORMAL LOW (ref 36.0–46.0)
Hemoglobin: 9.4 g/dL — ABNORMAL LOW (ref 12.0–15.0)
MCH: 23.3 pg — ABNORMAL LOW (ref 26.0–34.0)
MCHC: 32.3 g/dL (ref 30.0–36.0)
MCV: 72.2 fL — ABNORMAL LOW (ref 78.0–100.0)
Platelets: 285 10*3/uL (ref 150–400)
RBC: 4.03 MIL/uL (ref 3.87–5.11)
RDW: 15.8 % — ABNORMAL HIGH (ref 11.5–15.5)
WBC: 11.1 10*3/uL — ABNORMAL HIGH (ref 4.0–10.5)

## 2012-06-24 LAB — COMPREHENSIVE METABOLIC PANEL
ALT: 6 U/L (ref 0–35)
AST: 15 U/L (ref 0–37)
Albumin: 2.8 g/dL — ABNORMAL LOW (ref 3.5–5.2)
Alkaline Phosphatase: 193 U/L — ABNORMAL HIGH (ref 39–117)
BUN: 4 mg/dL — ABNORMAL LOW (ref 6–23)
CO2: 19 mEq/L (ref 19–32)
Calcium: 8.9 mg/dL (ref 8.4–10.5)
Chloride: 104 mEq/L (ref 96–112)
Creatinine, Ser: 0.4 mg/dL — ABNORMAL LOW (ref 0.50–1.10)
GFR calc Af Amer: >90 mL/min (ref >90)
GFR calc non Af Amer: >90 mL/min (ref >90)
Glucose, Bld: 75 mg/dL (ref 70–99)
Potassium: 3.6 mEq/L (ref 3.5–5.1)
Sodium: 136 mEq/L (ref 135–145)
Total Bilirubin: 0.3 mg/dL (ref 0.3–1.2)
Total Protein: 6.3 g/dL (ref 6.0–8.3)

## 2012-06-24 LAB — OB RESULTS CONSOLE GBS: GBS: NEGATIVE

## 2012-06-24 LAB — POCT URINALYSIS DIP (DEVICE)
Bilirubin Urine: NEGATIVE
Glucose, UA: NEGATIVE mg/dL
Hgb urine dipstick: NEGATIVE
Ketones, ur: 80 mg/dL — AB
Nitrite: NEGATIVE
Protein, ur: 30 mg/dL — AB
Specific Gravity, Urine: >=1.03 (ref 1.005–1.030)
Urobilinogen, UA: 0.2 mg/dL (ref 0.0–1.0)
pH: 6 (ref 5.0–8.0)

## 2012-06-24 LAB — LIPASE, BLOOD: Lipase: 25 U/L (ref 11–59)

## 2012-06-24 MED ORDER — HYDROMORPHONE HCL PF 2 MG/ML IJ SOLN: 1.0000 mg | Freq: Once | INTRAMUSCULAR | Status: DC

## 2012-06-24 MED ORDER — SODIUM CHLORIDE 0.9 % IV SOLN: 4.0000 mg | Freq: Once | INTRAVENOUS | Status: DC

## 2012-06-24 MED ORDER — ALUM & MAG HYDROXIDE-SIMETH 200-200-20 MG/5ML PO SUSP
30.0000 mL | Freq: Once | ORAL | Status: AC
  Administered 2012-06-24: 30 mL via ORAL
  Filled 2012-06-24: qty 30

## 2012-06-24 MED ORDER — DEXTROSE 5 % IN LACTATED RINGERS IV BOLUS: 1000.0000 mL | Freq: Once | INTRAVENOUS | Status: DC

## 2012-06-24 MED ORDER — ONDANSETRON 8 MG PO TBDP: 8.0000 mg | ORAL_TABLET | Freq: Once | ORAL | Status: DC

## 2012-06-24 MED ORDER — HYDROMORPHONE HCL PF 1 MG/ML IJ SOLN
1.0000 mg | Freq: Once | INTRAMUSCULAR | Status: AC
  Administered 2012-06-24: 1 mg via INTRAVENOUS
  Filled 2012-06-24: qty 1

## 2012-06-24 MED ORDER — METOCLOPRAMIDE HCL 10 MG PO TABS
10.0000 mg | ORAL_TABLET | Freq: Once | ORAL | Status: AC
  Administered 2012-06-24: 10 mg via ORAL
  Filled 2012-06-24: qty 1

## 2012-06-24 MED ORDER — DEXTROSE 5 % IN LACTATED RINGERS IV BOLUS
1000.0000 mL | Freq: Once | INTRAVENOUS | Status: AC
  Administered 2012-06-24: 1000 mL via INTRAVENOUS

## 2012-06-24 MED ORDER — DIPHENHYDRAMINE HCL 50 MG/ML IJ SOLN
25.0000 mg | Freq: Once | INTRAMUSCULAR | Status: AC
  Administered 2012-06-24: 25 mg via INTRAVENOUS
  Filled 2012-06-24: qty 1

## 2012-06-24 MED ORDER — LANSOPRAZOLE 30 MG PO CPDR: 30.0000 mg | DELAYED_RELEASE_CAPSULE | Freq: Two times a day (BID) | ORAL | Status: DC

## 2012-06-24 MED ORDER — ONDANSETRON HCL 4 MG/2ML IJ SOLN
4.0000 mg | Freq: Once | INTRAMUSCULAR | Status: AC
  Administered 2012-06-24: 4 mg via INTRAVENOUS
  Filled 2012-06-24: qty 2

## 2012-06-24 MED ORDER — ONDANSETRON 8 MG PO TBDP
8.0000 mg | ORAL_TABLET | Freq: Once | ORAL | Status: AC
  Administered 2012-06-24: 8 mg via ORAL
  Filled 2012-06-24: qty 1

## 2012-06-24 MED ORDER — METOCLOPRAMIDE HCL 10 MG PO TABS: 10.0000 mg | ORAL_TABLET | Freq: Once | ORAL | Status: DC

## 2012-06-24 NOTE — Progress Notes (Signed)
Pulse- 128 Patient reports decreased fetal movement still, but no less than her last visit. Patient reports RUQ pain from gallstones and lower back pain, reports some contractions. Still reporting a lot of nausea and says nothing is helping or working.

## 2012-06-24 NOTE — Progress Notes (Signed)
I saw and examined patient along with student and agree with above note.   Emma Chambers 06/24/2012 9:59 AM

## 2012-06-24 NOTE — Patient Instructions (Signed)
Normal Labor and Delivery  Your caregiver must first be sure you are in labor. Signs of labor include:  · You may pass what is called "the mucus plug" before labor begins. This is a small amount of blood stained mucus.  · Regular uterine contractions.  · The time between contractions get closer together.  · The discomfort and pain gradually gets more intense.  · Pains are mostly located in the back.  · Pains get worse when walking.  · The cervix (the opening of the uterus becomes thinner (begins to efface) and opens up (dilates).  Once you are in labor and admitted into the hospital or care center, your caregiver will do the following:  · A complete physical examination.  · Check your vital signs (blood pressure, pulse, temperature and the fetal heart rate).  · Do a vaginal examination (using a sterile glove and lubricant) to determine:  · The position (presentation) of the baby (head [vertex] or buttock first).  · The level (station) of the baby's head in the birth canal.  · The effacement and dilatation of the cervix.  · You may have your pubic hair shaved and be given an enema depending on your caregiver and the circumstance.  · An electronic monitor is usually placed on your abdomen. The monitor follows the length and intensity of the contractions, as well as the baby's heart rate.  · Usually, your caregiver will insert an IV in your arm with a bottle of sugar water. This is done as a precaution so that medications can be given to you quickly during labor or delivery.  NORMAL LABOR AND DELIVERY IS DIVIDED UP INTO 3 STAGES:  First Stage  This is when regular contractions begin and the cervix begins to efface and dilate. This stage can last from 3 to 15 hours. The end of the first stage is when the cervix is 100% effaced and 10 centimeters dilated. Pain medications may be given by   · Injection (morphine, demerol, etc.)  · Regional anesthesia (spinal, caudal or epidural, anesthetics given in different locations of  the spine). Paracervical pain medication may be given, which is an injection of and anesthetic on each side of the cervix.  A pregnant woman may request to have "Natural Childbirth" which is not to have any medications or anesthesia during her labor and delivery.  Second Stage  This is when the baby comes down through the birth canal (vagina) and is born. This can take 1 to 4 hours. As the baby's head comes down through the birth canal, you may feel like you are going to have a bowel movement. You will get the urge to bear down and push until the baby is delivered. As the baby's head is being delivered, the caregiver will decide if an episiotomy (a cut in the perineum and vagina area) is needed to prevent tearing of the tissue in this area. The episiotomy is sewn up after the delivery of the baby and placenta. Sometimes a mask with nitrous oxide is given for the mother to breath during the delivery of the baby to help if there is too much pain. The end of Stage 2 is when the baby is fully delivered. Then when the umbilical cord stops pulsating it is clamped and cut.  Third Stage  The third stage begins after the baby is completely delivered and ends after the placenta (afterbirth) is delivered. This usually takes 5 to 30 minutes. After the placenta is delivered, a medication   is given either by intravenous or injection to help contract the uterus and prevent bleeding. The third stage is not painful and pain medication is usually not necessary. If an episiotomy was done, it is repaired at this time.  After the delivery, the mother is watched and monitored closely for 1 to 2 hours to make sure there is no postpartum bleeding (hemorrhage). If there is a lot of bleeding, medication is given to contract the uterus and stop the bleeding.  Document Released: 12/12/2007 Document Revised: 05/27/2011 Document Reviewed: 12/12/2007  [employer]® Patient Information ©2013 [employer].

## 2012-06-24 NOTE — Progress Notes (Signed)
Continues to have vomiting every 2-3 hours. Able to keep fluids down, though with difficulty eating. On UA appears dehydrated with 80 ketones in urine. Has had irregular contractions since last visit. Movement decreased though remains the same as last visit. Felt move while in waiting room. GBS collected today. Will send to MAU to receive fluids given dehydration.

## 2012-06-24 NOTE — MAU Note (Signed)
"  sent up from downstairs for dehydration and pain"

## 2012-06-24 NOTE — MAU Provider Note (Signed)
History     CSN: [id number]  Arrival date & time 06/24/12  0941   None     Chief Complaint  Patient presents with  . Dehydration  . Abdominal Pain    HPI  Emma Chambers is a 22 y.o. G2P1001 at [redacted]w[redacted]d with known cholelithiasis who presented to the [facility] outpatient clinic today for a routine prenatal visit and was found to be dehydrated, subsequently sent to the MAU for evaluation and administration of IV fluids.  Patient reports that she has been drinking a lot of gatorade and water but in the last 24 hours has vomited about 10 times. She reports the vomit as being large volume and that she has kept nothing down. She continues to have RUQ/right side pain, which then shifts to the left side and sometimes to her suprapubic area. She is not sure if its gallstone pain or contractions because the pain moves all over. She reports having no energy. She has tried a bland diet and going on walks but the pain is only getting worse. Yesterday took zantac (takes two pills even though box says to take one) and some kind of liquid medicine for pain.  Is not feeling baby move as much as she used to. Last felt baby move a couple of minutes ago. No bleeding. Is having a mucous-type discharge. Has been urinating frequently.     Past Medical History  Diagnosis Date  . UTI (lower urinary tract infection)   . Hypertension     GHTN with last pg  . Pregnancy induced hypertension   . Headache   . Gallstones     Past Surgical History  Procedure Laterality Date  . No past surgeries      Family History  Problem Relation Age of Onset  . Asthma Mother   . Hypertension Mother   . Parkinsonism Mother   . Diabetes Maternal Grandmother   . Hypertension Maternal Grandmother   . Stroke Maternal Grandmother   . Anesthesia problems Neg Hx   . Malignant hyperthermia Neg Hx   . Pseudochol deficiency Neg Hx   . Hypotension Neg Hx   . Asthma Brother     History  Substance Use Topics  .  Smoking status: Never Smoker   . Smokeless tobacco: Never Used  . Alcohol Use: No    OB History   Grav Para Term Preterm Abortions TAB SAB Ect Mult Living   2 1 1  0 0 0 0 0 0 1     Per my MAU note from six days ago: Her prior pregnancy was born full term, vaginal delivery. No problems during that pregnancy but patient says she doesn't remember the delivery because she was on medicine that made her "out of it". That baby was born here.  Review of Systems Per HPI, but also reports + back pain in lower back Mucous-like discharge out of vagina. Denies vaginal bleeding Does endorse some leg pain, crampy-like pain in back of her calf. Also endorses having headaches sometimes, and blurry vision after she vomits. Also endorses reflux-like symptoms.   Allergies  Ambien; Amoxicillin; and Percocet  Home Medications  No current outpatient prescriptions on file.  BP 100/51  Pulse 111  Temp(Src) 99.5 F (37.5 C) (Oral)  Resp 20  Ht 5\' 1"  (1.549 m)  Wt 218 lb (98.884 kg)  BMI 41.21 kg/m2  SpO2 98%  LMP 10/17/2011  Physical Exam Gen: NAD HEENT: normocephalic, atraumatic, tongue is relatively moist Heart: RRR Lungs: CTAB, NWOB  Abd: gravid, otherwise soft. Mildly tender to palpation in RUQ, also with mild suprapubic tenderness. Ext: calves nontender to palpation, no appreciable pitting edema Neuro: grossly nonfocal, speech intact  MAU Course  Procedures (including critical care time)  Labs Reviewed - No data to display No results found.   No diagnosis found.   MDM  Emma Chambers is a 22 y.o. G2P1001 at [redacted]w[redacted]d with known gallstone disease complicating this pregnancy who presents from clinic with dehydration (80 ketones and 30 protein) and vomiting.  Plan: -D5LR 1L bolus for dehydration -zofran 4mg  IV x 1 for nausea -dilaudid 1mg  IV x 1 for pain -check UA, urine micro, CBC, CMET, lipase -monitor in MAU for signs of improvement  Levert Feinstein, MD [facility] Medicine  PGY-1  Patient has biliary colic and nausea and reflux, heartburn. Zantac not helping. Will add Reglan 10 mg QID, Prevacid 40 mg BID, and offered Maalox po now. RTC Next week, dietary counseling.

## 2012-06-24 NOTE — Progress Notes (Signed)
S:  Pt reports itching on back and arms after receiving dilaudid.  No SOB or difficulty breathing.   O:   Filed Vitals:   06/24/12 1245  BP:   Pulse:   Temp: 98 F (36.7 C)  Resp: 20  Lungs:  CTAB Skin:  Rash seen on back and arms.  Results for orders placed during the hospital encounter of 06/24/12 (from the past 24 hour(s))  CBC     Status: Abnormal   Collection Time    06/24/12 11:51 AM      Result Value Range   WBC 11.1 (*) 4.0 - 10.5 K/uL   RBC 4.03  3.87 - 5.11 MIL/uL   Hemoglobin 9.4 (*) 12.0 - 15.0 g/dL   HCT 16.1 (*) 09.6 - 04.5 %   MCV 72.2 (*) 78.0 - 100.0 fL   MCH 23.3 (*) 26.0 - 34.0 pg   MCHC 32.3  30.0 - 36.0 g/dL   RDW 40.9 (*) 81.1 - 91.4 %   Platelets 285  150 - 400 K/uL  COMPREHENSIVE METABOLIC PANEL     Status: Abnormal   Collection Time    06/24/12 11:51 AM      Result Value Range   Sodium 136  135 - 145 mEq/L   Potassium 3.6  3.5 - 5.1 mEq/L   Chloride 104  96 - 112 mEq/L   CO2 19  19 - 32 mEq/L   Glucose, Bld 75  70 - 99 mg/dL   BUN 4 (*) 6 - 23 mg/dL   Creatinine, Ser 7.82 (*) 0.50 - 1.10 mg/dL   Calcium 8.9  8.4 - 95.6 mg/dL   Total Protein 6.3  6.0 - 8.3 g/dL   Albumin 2.8 (*) 3.5 - 5.2 g/dL   AST 15  0 - 37 U/L   ALT 6  0 - 35 U/L   Alkaline Phosphatase 193 (*) 39 - 117 U/L   Total Bilirubin 0.3  0.3 - 1.2 mg/dL   GFR calc non Af Amer >90  >90 mL/min   GFR calc Af Amer >90  >90 mL/min   Pt reports continued pain > Dr Debroah Loop called to evaluate pain.

## 2012-06-24 NOTE — MAU Note (Signed)
Patient sent from the [facility] for IVF's and pain management.

## 2012-06-26 ENCOUNTER — Inpatient Hospital Stay (HOSPITAL_COMMUNITY)
Admission: AD | Admit: 2012-06-26 | Discharge: 2012-07-01 | DRG: 775 | Disposition: A | Discharge status: Home / Self Care | Payer: Medicaid Other | Source: Ambulatory Visit | Attending: Obstetrics & Gynecology | Admitting: Obstetrics & Gynecology

## 2012-06-26 ENCOUNTER — Encounter (HOSPITAL_COMMUNITY): Payer: Self-pay | Admitting: *Deleted

## 2012-06-26 DIAGNOSIS — E860 Dehydration: Secondary | ICD-10-CM | POA: Diagnosis present

## 2012-06-26 DIAGNOSIS — O26899 Other specified pregnancy related conditions, unspecified trimester: Secondary | ICD-10-CM

## 2012-06-26 DIAGNOSIS — D649 Anemia, unspecified: Secondary | ICD-10-CM | POA: Diagnosis present

## 2012-06-26 DIAGNOSIS — O9902 Anemia complicating childbirth: Secondary | ICD-10-CM | POA: Diagnosis present

## 2012-06-26 DIAGNOSIS — R1084 Generalized abdominal pain: Secondary | ICD-10-CM

## 2012-06-26 DIAGNOSIS — K8020 Calculus of gallbladder without cholecystitis without obstruction: Secondary | ICD-10-CM | POA: Diagnosis present

## 2012-06-26 LAB — CBC WITH DIFFERENTIAL/PLATELET
Basophils Absolute: 0 10*3/uL (ref 0.0–0.1)
Basophils Relative: 0 % (ref 0–1)
Eosinophils Absolute: 0.4 10*3/uL (ref 0.0–0.7)
Eosinophils Relative: 3 % (ref 0–5)
HCT: 28.3 % — ABNORMAL LOW (ref 36.0–46.0)
Hemoglobin: 9.1 g/dL — ABNORMAL LOW (ref 12.0–15.0)
Lymphocytes Relative: 18 % (ref 12–46)
Lymphs Abs: 1.8 10*3/uL (ref 0.7–4.0)
MCH: 23.2 pg — ABNORMAL LOW (ref 26.0–34.0)
MCHC: 32.2 g/dL (ref 30.0–36.0)
MCV: 72 fL — ABNORMAL LOW (ref 78.0–100.0)
Monocytes Absolute: 0.5 10*3/uL (ref 0.1–1.0)
Monocytes Relative: 5 % (ref 3–12)
Neutro Abs: 7.7 10*3/uL (ref 1.7–7.7)
Neutrophils Relative %: 74 % (ref 43–77)
Platelets: 271 10*3/uL (ref 150–400)
RBC: 3.93 MIL/uL (ref 3.87–5.11)
RDW: 15.9 % — ABNORMAL HIGH (ref 11.5–15.5)
WBC: 10.4 10*3/uL (ref 4.0–10.5)

## 2012-06-26 LAB — COMPREHENSIVE METABOLIC PANEL
ALT: 6 U/L (ref 0–35)
AST: 15 U/L (ref 0–37)
Albumin: 2.8 g/dL — ABNORMAL LOW (ref 3.5–5.2)
Alkaline Phosphatase: 198 U/L — ABNORMAL HIGH (ref 39–117)
BUN: <3 mg/dL — ABNORMAL LOW (ref 6–23)
CO2: 19 mEq/L (ref 19–32)
Calcium: 8.9 mg/dL (ref 8.4–10.5)
Chloride: 103 mEq/L (ref 96–112)
Creatinine, Ser: 0.43 mg/dL — ABNORMAL LOW (ref 0.50–1.10)
GFR calc Af Amer: >90 mL/min (ref >90)
GFR calc non Af Amer: >90 mL/min (ref >90)
Glucose, Bld: 70 mg/dL (ref 70–99)
Potassium: 3.6 mEq/L (ref 3.5–5.1)
Sodium: 134 mEq/L — ABNORMAL LOW (ref 135–145)
Total Bilirubin: 0.5 mg/dL (ref 0.3–1.2)
Total Protein: 6.1 g/dL (ref 6.0–8.3)

## 2012-06-26 LAB — AMNISURE RUPTURE OF MEMBRANE (ROM) NOT AT ARMC: Amnisure ROM: NEGATIVE

## 2012-06-26 LAB — URINALYSIS, ROUTINE W REFLEX MICROSCOPIC
Bilirubin Urine: NEGATIVE
Glucose, UA: NEGATIVE mg/dL
Ketones, ur: >80 mg/dL — AB
Leukocytes, UA: NEGATIVE
Nitrite: NEGATIVE
Protein, ur: NEGATIVE mg/dL
Specific Gravity, Urine: 1.025 (ref 1.005–1.030)
Urobilinogen, UA: 1 mg/dL (ref 0.0–1.0)
pH: 6 (ref 5.0–8.0)

## 2012-06-26 LAB — WET PREP, GENITAL
Clue Cells Wet Prep HPF POC: NONE SEEN
Trich, Wet Prep: NONE SEEN
Yeast Wet Prep HPF POC: NONE SEEN

## 2012-06-26 LAB — URINE MICROSCOPIC-ADD ON

## 2012-06-26 LAB — TYPE AND SCREEN
ABO/RH(D): A POS
Antibody Screen: NEGATIVE

## 2012-06-26 LAB — POCT FERN TEST: POCT Fern Test: NEGATIVE

## 2012-06-26 LAB — LIPASE, BLOOD: Lipase: 25 U/L (ref 11–59)

## 2012-06-26 MED ORDER — PROMETHAZINE HCL 25 MG/ML IJ SOLN
12.5000 mg | Freq: Four times a day (QID) | INTRAMUSCULAR | Status: DC | PRN
  Administered 2012-06-26 – 2012-06-27 (×3): 12.5 mg via INTRAVENOUS
  Filled 2012-06-26 (×3): qty 1

## 2012-06-26 MED ORDER — ONDANSETRON HCL 4 MG/2ML IJ SOLN
4.0000 mg | Freq: Four times a day (QID) | INTRAMUSCULAR | Status: DC | PRN
  Administered 2012-06-26: 4 mg via INTRAVENOUS
  Filled 2012-06-26: qty 2

## 2012-06-26 MED ORDER — FENTANYL CITRATE 0.05 MG/ML IJ SOLN
50.0000 ug | Freq: Once | INTRAMUSCULAR | Status: AC
  Administered 2012-06-26: 50 ug via INTRAVENOUS
  Filled 2012-06-26: qty 2

## 2012-06-26 MED ORDER — ACETAMINOPHEN 325 MG PO TABS: 650.0000 mg | ORAL_TABLET | ORAL | Status: DC | PRN

## 2012-06-26 MED ORDER — FENTANYL CITRATE 0.05 MG/ML IJ SOLN
100.0000 ug | Freq: Once | INTRAMUSCULAR | Status: AC
  Administered 2012-06-26: 100 ug via INTRAVENOUS
  Filled 2012-06-26: qty 2

## 2012-06-26 MED ORDER — CALCIUM CARBONATE ANTACID 500 MG PO CHEW: 2.0000 | CHEWABLE_TABLET | ORAL | Status: DC | PRN

## 2012-06-26 MED ORDER — PRENATAL MULTIVITAMIN CH: 1.0000 | ORAL_TABLET | Freq: Every day | ORAL | Status: DC

## 2012-06-26 MED ORDER — FENTANYL CITRATE 0.05 MG/ML IJ SOLN
100.0000 ug | INTRAMUSCULAR | Status: DC | PRN
  Administered 2012-06-26 – 2012-06-28 (×6): 100 ug via INTRAVENOUS
  Filled 2012-06-26 (×6): qty 2

## 2012-06-26 MED ORDER — LACTATED RINGERS IV BOLUS (SEPSIS): 1000.0000 mL | Freq: Once | INTRAVENOUS | Status: DC

## 2012-06-26 MED ORDER — LACTATED RINGERS IV SOLN
INTRAVENOUS | Status: DC
  Administered 2012-06-26 – 2012-06-27 (×5): via INTRAVENOUS

## 2012-06-26 MED ORDER — DOCUSATE SODIUM 100 MG PO CAPS: 100.0000 mg | ORAL_CAPSULE | Freq: Every day | ORAL | Status: DC

## 2012-06-26 MED ORDER — METOCLOPRAMIDE HCL 5 MG/ML IJ SOLN
10.0000 mg | Freq: Three times a day (TID) | INTRAMUSCULAR | Status: DC | PRN
  Administered 2012-06-27: 10 mg via INTRAVENOUS
  Filled 2012-06-26: qty 2

## 2012-06-26 MED ORDER — DOCUSATE SODIUM 100 MG PO CAPS: 100.0000 mg | ORAL_CAPSULE | Freq: Two times a day (BID) | ORAL | Status: DC | PRN

## 2012-06-26 MED ORDER — LACTATED RINGERS IV BOLUS (SEPSIS)
1000.0000 mL | Freq: Once | INTRAVENOUS | Status: AC
  Administered 2012-06-26: 1000 mL via INTRAVENOUS

## 2012-06-26 MED ORDER — OXYCODONE-ACETAMINOPHEN 5-325 MG/5ML PO SOLN: 5.0000 mL | ORAL | Status: DC | PRN

## 2012-06-26 NOTE — MAU Provider Note (Signed)
History     CSN: [id number]  Arrival date and time: 06/26/12 1030   None     Chief Complaint  Patient presents with  . Labor Eval  . Rupture of Membranes   HPI  Rieley Khalsa is a 22 y.o. G2P1001 at [redacted]w[redacted]d with a history of biliary colic (planning cholecystectomy two weeks after delivers) who presents with a chief complaint of contractions, spotting, and fluid leaking.  Started feeling contractions last night around 8pm, this continued throughout the night. Around midnight started seeing spotting. She brushed it off and went back to sleep. Continued to see spotting into the morning. She told her mom about the symptoms and her mom made her come to the MAU. Felt the urge to push this morning. When she was checking in she was vomiting and felt a popping sensation in her genital area and felt fluid running down her leg.   Has had more intense pain and vomiting the last few days since last being seen in the MAU. Taking the Reglan but it has not helped at all. Crackers and gatorade won't stay down. Last bowel movement was last night. Denies having a fever at home.  OB History   Grav Para Term Preterm Abortions TAB SAB Ect Mult Living   2 1 1  0 0 0 0 0 0 1      Past Medical History  Diagnosis Date  . UTI (lower urinary tract infection)   . Hypertension     GHTN with last pg  . Pregnancy induced hypertension   . Headache   . Gallstones     Past Surgical History  Procedure Laterality Date  . No past surgeries      Family History  Problem Relation Age of Onset  . Asthma Mother   . Hypertension Mother   . Parkinsonism Mother   . Diabetes Maternal Grandmother   . Hypertension Maternal Grandmother   . Stroke Maternal Grandmother   . Anesthesia problems Neg Hx   . Malignant hyperthermia Neg Hx   . Pseudochol deficiency Neg Hx   . Hypotension Neg Hx   . Asthma Brother     History  Substance Use Topics  . Smoking status: Never Smoker   . Smokeless tobacco: Never Used   . Alcohol Use: No    Allergies:  Allergies  Allergen Reactions  . Ambien (Zolpidem Tartrate) Other (See Comments)    Caused her to have hallucinations  . Amoxicillin Nausea And Vomiting and Other (See Comments)    Tics & twitches  . Percocet (Oxycodone-Acetaminophen) Other (See Comments)    My arm was burning    Prescriptions prior to admission  Medication Sig Dispense Refill  . docusate sodium (COLACE) 100 MG capsule Take 1 capsule (100 mg total) by mouth 2 (two) times daily as needed for constipation.  30 capsule  2  . lansoprazole (PREVACID) 30 MG capsule Take 1 capsule (30 mg total) by mouth 2 (two) times daily.  40 capsule  1  . Menthol, Topical Analgesic, (ICY HOT PAIN RELIEVING EX) Apply 1 application topically daily as needed (for back pain).      Marland Kitchen metoCLOPramide (REGLAN) 10 MG tablet Take 1 tablet (10 mg total) by mouth once.  60 tablet  1  . ondansetron (ZOFRAN-ODT) 8 MG disintegrating tablet Take 1 tablet (8 mg total) by mouth once.  20 tablet  1  . oxyCODONE-acetaminophen (ROXICET) 5-325 MG/5ML solution Take 5-10 mLs by mouth every 4 (four) hours as needed for pain.  500 mL  0    ROS Physical Exam    Filed Vitals:   06/26/12 1049 06/26/12 1052 06/26/12 1206  BP: 108/71  105/48  Pulse: 128 119 112  Temp: 100.3 F (37.9 C)  98.9 F (37.2 C)  TempSrc: Oral  Oral  Resp: 20  18  SpO2:  100%     Physical Exam Gen: NAD HEENT: tongue dry Heart: RRR Lungs: NWOB Abd: gravid but otherwise completely soft, nontender to palpation. Negative murphy's sign. Ext: no appreciable lower extremity edema bilaterally Neuro: grossly nonfocal, speech intact GU: normal appearing external genitalia. Sterile speculum exam shows some white discharge present in the vaginal vault. No pooling. Dilation: 1 Effacement (%): 50 Cervical Position: Posterior Station: -3 Presentation: Vertex Exam by:: Sarajane Marek, RNC   MAU Course  Procedures  Fern slide:negative Amnisure:  negative Wet prep: no trich, clue cells, or yeast Defer gc/chlamydia, was negative 8 days ago  Urinalysis    Component Value Date/Time   COLORURINE YELLOW 06/26/2012 1225   APPEARANCEUR CLEAR 06/26/2012 1225   LABSPEC 1.025 06/26/2012 1225   PHURINE 6.0 06/26/2012 1225   GLUCOSEU NEGATIVE 06/26/2012 1225   HGBUR TRACE* 06/26/2012 1225   BILIRUBINUR NEGATIVE 06/26/2012 1225   KETONESUR >80* 06/26/2012 1225   PROTEINUR NEGATIVE 06/26/2012 1225   UROBILINOGEN 1.0 06/26/2012 1225   NITRITE NEGATIVE 06/26/2012 1225   LEUKOCYTESUR NEGATIVE 06/26/2012 1225    FHR: baseline 140s, moderate variability, + accels, no decels Toco: ctx q5 minutes  Assessment and Plan  Emmalina Espericueta is a 22 y.o. G2P1001 at [redacted]w[redacted]d who presents for evaluation for possible rupture of membranes. No signs of rupture given negative pooling, fern, and amnisure. Cervix dilated only 1 cm.  Initially with low grade fever (100.3) which resolved less than two hours later. Tachycardic and patient does appear dry on exam. Urine shows >80 of ketones, further suggesting dehydration. Patient has had multiple presentations to the MAU over the past several weeks for abdominal pain, vomiting, and dehydration, all deemed secondary to her biliary colic. As she is at [redacted]w[redacted]d, she is not far enough along to induce labor.  Plan: -admit to antepartum unit -hydrate with IV fluids overnight -plan to place PICC line tomorrow morning for administration of TPN -will set up home health RN to assist with TPN at home -fentanyl IV for pain management   Levert Feinstein 06/26/2012, 11:38 AM

## 2012-06-26 NOTE — MAU Note (Signed)
Patient states she has had water running down legs with vomiting. Having abdominal pain and vomiting.

## 2012-06-26 NOTE — Progress Notes (Addendum)
INITIAL NUTRITION ASSESSMENT  DOCUMENTATION CODES Per approved criteria  -Non-severe (moderate) malnutrition in the context of acute illness   INTERVENTION: Parenteral support planned C/L diet Anyone with a negligible po intake for > 5 days is at risk for refeeding syndrome. May wish to take 48 hours to reach goal caloric and protein intake. Monitor Phos, K and Mg levels   NUTRITION DIAGNOSIS: Inadequate oral intake  related to n/v, gallstones as evidenced by weight loss.   Goal: Meet 90% of estimated needs of pregnancy  Monitor:  Tolerance and weight trend  Reason for Assessment: MD consult for parenteral support  22 y.o. female  Admitting Dx: <principal problem not specified> Patient Active Problem List  Diagnosis  . H. pylori infection  . Generalized headaches  . Encounter for insertion of mirena IUD  . Pregnancy with generalized abdominal pain, antepartum  . Cholelithiasis complicating pregnancy, antepartum  . Pica in adults  . Preterm labor without delivery in third trimester  . Anemia complicating pregnancy in third trimester    ASSESSMENT: PNR indicates an almost 3 month Hx of n/v, abdominal pain, multiple adm for same. Unable to tolerate solid food. Max weight was on 02/27/12 and 228 Lbs, weight now 10 Lbs less, 4.4 % decline Pt currently at 36 weeks and requires a PICC line and parenteral support to provide adequate nutrition Height: Ht Readings from Last 1 Encounters:  06/26/12 5\' 1"  (1.549 m)    Weight: Wt Readings from Last 1 Encounters:  06/26/12 218 lb (98.884 kg)    Ideal Body Weight: 105 lbs/47.7 kg  % Ideal Body Weight: pre-pregnancy weight was 152% of IBW  Wt Readings from Last 10 Encounters:  06/26/12 218 lb (98.884 kg)  06/24/12 218 lb (98.884 kg)  06/24/12 218 lb (98.884 kg)  06/21/12 220 lb (99.791 kg)  06/17/12 219 lb (99.338 kg)  06/16/12 220 lb (99.791 kg)  06/14/12 222 lb (100.699 kg)  06/11/12 222 lb (100.699 kg)  05/29/12 205  lb (92.987 kg)  05/22/12 224 lb (101.606 kg)    Usual Body Weight: 160 Lbs  % Usual Body Weight: 136%  BMI:  Body mass index is 41.21 kg/(m^2).  Estimated Nutritional Needs: Kcal: 1900-2100 Protein: 80-95 g Fluid: 2.3-2.5 liters   Diet Order: Clear Liquid  EDUCATION NEEDS: -No education needs identified at this time  No intake or output data in the 24 hours ending 06/26/12 1914    Labs:   Recent Labs Lab 06/24/12 1151 06/26/12 1335  NA 136 134*  K 3.6 3.6  CL 104 103  CO2 19 19  BUN 4* <3*  CREATININE 0.40* 0.43*  CALCIUM 8.9 8.9  GLUCOSE 75 70    CBG (last 3)  No results found for this basename: GLUCAP,  in the last 72 hours  Scheduled Meds: . docusate sodium  100 mg Oral Daily  . [START ON 06/27/2012] prenatal multivitamin  1 tablet Oral Q1200    Continuous Infusions: . lactated ringers 125 mL/hr at 06/26/12 1518    Past Medical History  Diagnosis Date  . UTI (lower urinary tract infection)   . Hypertension     GHTN with last pg  . Pregnancy induced hypertension   . Headache   . Gallstones     Past Surgical History  Procedure Laterality Date  . No past surgeries      Elisabeth Cara M.Odis Luster LDN Neonatal Nutrition Support Specialist Pager [phone]

## 2012-06-26 NOTE — MAU Note (Signed)
Thinks water has broken. No obvious fluid leaking. Contractions since 10:00. C/O urge to push.

## 2012-06-26 NOTE — MAU Provider Note (Signed)
Attestation of Attending Supervision of Advanced Practitioner: Evaluation and management procedures were performed by the PA/NP/CNM/OB Fellow under my supervision/collaboration. Chart reviewed and agree with management and plan.  Patient currently ambivalent regarding PICC line, will discuss further with the patient later tonight.  Tilda Burrow 06/26/2012 7:38 PM

## 2012-06-26 NOTE — H&P (Signed)
Emma Chambers is a 22 y.o. G2P1001 at [redacted]w[redacted]d admitted for cholelithiasis with intractable nausea, pain and dehydration.   Fetal presentation is cephalic though not confirmed on ultrasound.  History of Present Illness: Today, the patient came in for possible rupture of membranes. She felt a gush of fluid and wet her clothing earlier today when she vomited. However, she also complains of severe abdominal pain and vomiting yellow liquid several times a day. She has been seen in MAU 4 times this month already for gallbladder pain. She was originally seen by [facility] but transferred care due to her medical problems. She has tried various medications for nausea and pain but still cannot keep food or liquids down and has 8-10/10 pain in epigastric/RUQ.   Patient reports the fetal movement as active. Patient reports uterine contraction  activity as none. Patient reports  vaginal bleeding as none. Patient describes fluid per vagina as Clear.  Patient Active Problem List  Diagnosis  . H. pylori infection  . Generalized headaches  . Encounter for insertion of mirena IUD  . Pregnancy with generalized abdominal pain, antepartum  . Cholelithiasis complicating pregnancy, antepartum  . Pica in adults  . Preterm labor without delivery in third trimester  . Anemia complicating pregnancy in third trimester   Past Medical History: Past Medical History  Diagnosis Date  . UTI (lower urinary tract infection)   . Hypertension     GHTN with last pg  . Pregnancy induced hypertension   . Headache   . Gallstones     Past Surgical History: Past Surgical History  Procedure Laterality Date  . No past surgeries      Obstetrical History: OB History   Grav Para Term Preterm Abortions TAB SAB Ect Mult Living   2 1 1  0 0 0 0 0 0 1    Vaginal deliveries, no complications.   Social History: History   Social History  . Marital Status: Single    Spouse Name: N/A    Number of Children: N/A  . Years  of Education: N/A   Social History Main Topics  . Smoking status: Never Smoker   . Smokeless tobacco: Never Used  . Alcohol Use: No  . Drug Use: No  . Sexually Active: Not Currently   Other Topics Concern  . None   Social History Narrative  . None    Family History: Family History  Problem Relation Age of Onset  . Asthma Mother   . Hypertension Mother   . Parkinsonism Mother   . Diabetes Maternal Grandmother   . Hypertension Maternal Grandmother   . Stroke Maternal Grandmother   . Anesthesia problems Neg Hx   . Malignant hyperthermia Neg Hx   . Pseudochol deficiency Neg Hx   . Hypotension Neg Hx   . Asthma Brother     Allergies: Allergies  Allergen Reactions  . Ambien (Zolpidem Tartrate) Other (See Comments)    Caused her to have hallucinations  . Amoxicillin Nausea And Vomiting and Other (See Comments)    Tics & twitches  . Dilaudid (Hydromorphone Hcl) Itching  . Percocet (Oxycodone-Acetaminophen) Other (See Comments)    My arm was burning    Prescriptions prior to admission  Medication Sig Dispense Refill  . docusate sodium (COLACE) 100 MG capsule Take 1 capsule (100 mg total) by mouth 2 (two) times daily as needed for constipation.  30 capsule  2  . lansoprazole (PREVACID) 30 MG capsule Take 1 capsule (30 mg total) by mouth 2 (  two) times daily.  40 capsule  1  . Menthol, Topical Analgesic, (ICY HOT PAIN RELIEVING EX) Apply 1 application topically daily as needed (for back pain).      Marland Kitchen metoCLOPramide (REGLAN) 10 MG tablet Take 1 tablet (10 mg total) by mouth once.  60 tablet  1  . ondansetron (ZOFRAN-ODT) 8 MG disintegrating tablet Take 1 tablet (8 mg total) by mouth once.  20 tablet  1  . oxyCODONE-acetaminophen (ROXICET) 5-325 MG/5ML solution Take 5-10 mLs by mouth every 4 (four) hours as needed for pain.  500 mL  0   REVIEW OF SYSTEMS:   Gen:  No fever/chills HEENT:  No headache, vision changes CV/RESP:  No chest pain or SOB ABD:  Nausea, vomiting  (yellow fluid) - no blood, epigastric/RUQ pain, no diarrhea GU:  Fluid per vagina, no other discharge, no dysuria.  Physical Exam    Filed Vitals:   06/26/12 1052 06/26/12 1206 06/26/12 1432 06/26/12 1725  BP:  105/48 108/62 110/61  Pulse: 119 112 101 107  Temp:  98.9 F (37.2 C) 98.4 F (36.9 C) 98.3 F (36.8 C)  TempSrc:  Oral Oral Oral  Resp:  18 16 24   Height:    5\' 1"  (1.549 m)  Weight:    98.884 kg (218 lb)  SpO2: 100%       Gen: NAD  HEENT: tongue dry  Heart: RRR  Lungs: NWOB  Abd: gravid but otherwise completely soft, nontender to palpation. Negative murphy's sign.  Ext: no appreciable lower extremity edema bilaterally  Neuro: grossly nonfocal, speech intact  GU: normal appearing external genitalia. Sterile speculum exam shows some white discharge present in the vaginal vault. No pooling.   Dilation: 1  Effacement (%): 50  Cervical Position: Posterior  Station: -3  Presentation: Vertex  Exam by:: Sarajane Marek, RNC  Membranes:intact  FERN negative, AMNISURE negative Fetal Monitoring:Baseline: 135 bpm, moderate variability, accels present, occasional decl.   Labs:  Results for orders placed during the hospital encounter of 06/26/12 (from the past 24 hour(s))  AMNISURE RUPTURE OF MEMBRANE (ROM)   Collection Time    06/26/12 11:53 AM      Result Value Range   Amnisure ROM NEGATIVE    WET PREP, GENITAL   Collection Time    06/26/12 11:55 AM      Result Value Range   Yeast Wet Prep HPF POC NONE SEEN  NONE SEEN   Trich, Wet Prep NONE SEEN  NONE SEEN   Clue Cells Wet Prep HPF POC NONE SEEN  NONE SEEN   WBC, Wet Prep HPF POC MODERATE (*) NONE SEEN  POCT FERN TEST   Collection Time    06/26/12 12:11 PM      Result Value Range   POCT Fern Test Negative = intact amniotic membranes    URINALYSIS, ROUTINE W REFLEX MICROSCOPIC   Collection Time    06/26/12 12:25 PM      Result Value Range   Color, Urine YELLOW  YELLOW   APPearance CLEAR  CLEAR   Specific  Gravity, Urine 1.025  1.005 - 1.030   pH 6.0  5.0 - 8.0   Glucose, UA NEGATIVE  NEGATIVE mg/dL   Hgb urine dipstick TRACE (*) NEGATIVE   Bilirubin Urine NEGATIVE  NEGATIVE   Ketones, ur >80 (*) NEGATIVE mg/dL   Protein, ur NEGATIVE  NEGATIVE mg/dL   Urobilinogen, UA 1.0  0.0 - 1.0 mg/dL   Nitrite NEGATIVE  NEGATIVE   Leukocytes, UA NEGATIVE  NEGATIVE  URINE MICROSCOPIC-ADD ON   Collection Time    06/26/12 12:25 PM      Result Value Range   Squamous Epithelial / LPF FEW (*) RARE   WBC, UA 0-2  <3 WBC/hpf   RBC / HPF 0-2  <3 RBC/hpf   Bacteria, UA RARE  RARE   Urine-Other MUCOUS PRESENT    CBC WITH DIFFERENTIAL   Collection Time    06/26/12  1:35 PM      Result Value Range   WBC 10.4  4.0 - 10.5 K/uL   RBC 3.93  3.87 - 5.11 MIL/uL   Hemoglobin 9.1 (*) 12.0 - 15.0 g/dL   HCT 16.1 (*) 09.6 - 04.5 %   MCV 72.0 (*) 78.0 - 100.0 fL   MCH 23.2 (*) 26.0 - 34.0 pg   MCHC 32.2  30.0 - 36.0 g/dL   RDW 40.9 (*) 81.1 - 91.4 %   Platelets 271  150 - 400 K/uL   Neutrophils Relative 74  43 - 77 %   Neutro Abs 7.7  1.7 - 7.7 K/uL   Lymphocytes Relative 18  12 - 46 %   Lymphs Abs 1.8  0.7 - 4.0 K/uL   Monocytes Relative 5  3 - 12 %   Monocytes Absolute 0.5  0.1 - 1.0 K/uL   Eosinophils Relative 3  0 - 5 %   Eosinophils Absolute 0.4  0.0 - 0.7 K/uL   Basophils Relative 0  0 - 1 %   Basophils Absolute 0.0  0.0 - 0.1 K/uL  COMPREHENSIVE METABOLIC PANEL   Collection Time    06/26/12  1:35 PM      Result Value Range   Sodium 134 (*) 135 - 145 mEq/L   Potassium 3.6  3.5 - 5.1 mEq/L   Chloride 103  96 - 112 mEq/L   CO2 19  19 - 32 mEq/L   Glucose, Bld 70  70 - 99 mg/dL   BUN <3 (*) 6 - 23 mg/dL   Creatinine, Ser 7.82 (*) 0.50 - 1.10 mg/dL   Calcium 8.9  8.4 - 95.6 mg/dL   Total Protein 6.1  6.0 - 8.3 g/dL   Albumin 2.8 (*) 3.5 - 5.2 g/dL   AST 15  0 - 37 U/L   ALT 6  0 - 35 U/L   Alkaline Phosphatase 198 (*) 39 - 117 U/L   Total Bilirubin 0.5  0.3 - 1.2 mg/dL   GFR calc non Af Amer  >90  >90 mL/min   GFR calc Af Amer >90  >90 mL/min  LIPASE, BLOOD   Collection Time    06/26/12  1:35 PM      Result Value Range   Lipase 25  11 - 59 U/L    Imaging Studies: US Abdomen Complete  05/31/2012  *RADIOLOGY REPORT*  Clinical Data:  Unspecified abdominal pain.  The patient is [redacted] weeks pregnant.  COMPLETE ABDOMINAL ULTRASOUND  Comparison:  Urinary tract ultrasound yesterday.  Findings:  Gallbladder:  Multiple small mobile, nonshadowing gallstones, on the order of 4-5 mm in size.  No gallbladder wall thickening or pericholecystic fluid.  Negative sonographic Murphy's sign according to the ultrasound technologist.  Common bile duct:  Normal in caliber with maximum diameter approximating 3 mm.  Liver:  Normal size and echotexture without focal parenchymal abnormality.  Patent portal vein with hepatopetal flow.  IVC:  Patent.  Pancreas:  Normal size and echotexture without focal parenchymal abnormality.  Spleen:  Normal size  and echotexture without focal parenchymal abnormality.  Right Kidney:  No hydronephrosis.  Well-preserved cortex.  No shadowing calculi.  Normal size and parenchymal echotexture without focal abnormalities.  Approximately 12.8 cm in length.  Left Kidney:  No hydronephrosis.  Well-preserved cortex.  No shadowing calculi.  Normal size and parenchymal echotexture without focal abnormalities.  Approximately 12.7 cm in length.  Abdominal aorta:  Normal in caliber throughout its visualized course in the abdomen without significant atherosclerosis.  IMPRESSION:  1.  Cholelithiasis without sonographic evidence of acute cholecystitis. 2.  Otherwise normal examination.   Original Report Authenticated By: Hulan Saas, M.D.    US Ob Limited  05/29/2012  OBSTETRICAL ULTRASOUND: This exam was performed within a [place] [facility]. The OB US report was generated in the [facility] system, and faxed to the ordering physician.   This report is also available in [employer] and in the [employer]. See [facility] Obstetric US report.   US Renal  05/30/2012  *RADIOLOGY REPORT*  Clinical Data: Right flank pain and burning.  The patient is [redacted] weeks pregnant.  RENAL/URINARY TRACT ULTRASOUND COMPLETE  Comparison:  None  Findings:  Right Kidney:   12.7 cm in length.  No mass or hydronephrosis.  Left Kidney:  12.0 cm in length.  No mass or hydronephrosis.  Bladder:  Normal in appearance.  Bilateral ureteral jets are visualized.  IMPRESSION: Normal renal ultrasound.   Original Report Authenticated By: Norva Pavlov, M.D.    US Abdomen Limited Ruq  06/14/2012  *RADIOLOGY REPORT*  Clinical Data: Gallstones  LIMITED RIGHT UPPER QUADRANT ULTRASOUND  Technique:  Complete abdominal ultrasound examination was performed including evaluation of the liver, gallbladder, bile ducts, pancreas, kidneys, spleen, IVC, and abdominal aorta.  Comparison: None.  Findings: Multiple small layering gallstones are present with some shadowing.  No wall thickening or pericholecystic fluid.  No Murphy's sign.  The common bile duct is normal in caliber.  The liver is normal in echogenicity and without focal mass.  IMPRESSION:  Cholelithiasis.  No evidence of acute cholecystitis.   Original Report Authenticated By: Jolaine Click, M.D.    . docusate sodium  100 mg Oral Daily  . [START ON 06/27/2012] prenatal multivitamin  1 tablet Oral Q1200    ASSESSMENT: 22 y.o. G2P1001 at [redacted]w[redacted]d - IUP:  FHTs reactive - Cholelithiasis:  Planning surgery postpartum.  No signs of acute cholecystitis or cholangitis. Had temp of 100.3 initially but afebrile now with no leukocytosis - Nausea/vomiting with dehydration:  Will get PICC line (pt has some hesitancy but has agreed to get with idea of going home with TPN/fluids.  Continue reglan, zofran - Pain:  Fentanyl, tylenol, percocet. Consider home with fentanyl patch if unable to tolerate PO. - Discussed importance of restricting fat in diet. - PICC team arranged for  06/27/12 AM. Kerrie Pleasure (nutritionist) to write note with nutritional requirements of TPN before tomorrow. Pharmacy aware. Need to get TPN order in by noon for 6:00 PM bag.  Discussed with case management - will be able to arrange home health care tomorrow. - Anticipate d/c home Sunday.  Has [facility] appt Monday.  Napoleon Form, MD

## 2012-06-26 NOTE — Progress Notes (Signed)
Patient resting in bed, talkative, smiling. Occas. UC's. Not using breathing techniques, talking throughout.

## 2012-06-27 DIAGNOSIS — K8020 Calculus of gallbladder without cholecystitis without obstruction: Secondary | ICD-10-CM

## 2012-06-27 DIAGNOSIS — D649 Anemia, unspecified: Secondary | ICD-10-CM

## 2012-06-27 DIAGNOSIS — O9902 Anemia complicating childbirth: Secondary | ICD-10-CM

## 2012-06-27 DIAGNOSIS — O26899 Other specified pregnancy related conditions, unspecified trimester: Principal | ICD-10-CM

## 2012-06-27 LAB — CULTURE, BETA STREP (GROUP B ONLY)

## 2012-06-27 NOTE — Progress Notes (Signed)
Patient ID: Emma Chambers, female   DOB: 06/12/1990, 22 y.o.   MRN: [id number]  FACULTY PRACTICE ANTEPARTUM(COMPREHENSIVE) NOTE  Amaree Loisel is a 22 y.o. G2P1001 at [redacted]w[redacted]d  who is admitted for dehydration, gallstone pain.   Length of Stay:  1  Days  Subjective: Pt ate full breakfast and vomited x1. Patient reports the fetal movement as active. Patient reports uterine contraction  activity as none. Patient reports  vaginal bleeding as none. Patient describes fluid per vagina as None.  Vitals:  Blood pressure 101/50, pulse 103, temperature 97.7 F (36.5 C), temperature source Oral, resp. rate 20, height 5\' 1"  (1.549 m), weight 218 lb (98.884 kg), last menstrual period 10/17/2011, SpO2 100.00%. Physical Examination:  General appearance - alert, well appearing, and in no distress Abdomen - gavid, soft, tender in RUQ Pelvic - cervix 1-2/thick/ballotable Extremities - Homan's sign negative bilaterally  Fetal Monitoring:  Baseline: 130 bpm, Variability: Good {> 6 bpm), Accelerations: Reactive and Decelerations: Absent  Labs:  Results for orders placed during the hospital encounter of 06/26/12 (from the past 24 hour(s))  AMNISURE RUPTURE OF MEMBRANE (ROM)   Collection Time    06/26/12 11:53 AM      Result Value Range   Amnisure ROM NEGATIVE    WET PREP, GENITAL   Collection Time    06/26/12 11:55 AM      Result Value Range   Yeast Wet Prep HPF POC NONE SEEN  NONE SEEN   Trich, Wet Prep NONE SEEN  NONE SEEN   Clue Cells Wet Prep HPF POC NONE SEEN  NONE SEEN   WBC, Wet Prep HPF POC MODERATE (*) NONE SEEN  POCT FERN TEST   Collection Time    06/26/12 12:11 PM      Result Value Range   POCT Fern Test Negative = intact amniotic membranes    URINALYSIS, ROUTINE W REFLEX MICROSCOPIC   Collection Time    06/26/12 12:25 PM      Result Value Range   Color, Urine YELLOW  YELLOW   APPearance CLEAR  CLEAR   Specific Gravity, Urine 1.025  1.005 - 1.030   pH 6.0  5.0 - 8.0   Glucose,  UA NEGATIVE  NEGATIVE mg/dL   Hgb urine dipstick TRACE (*) NEGATIVE   Bilirubin Urine NEGATIVE  NEGATIVE   Ketones, ur >80 (*) NEGATIVE mg/dL   Protein, ur NEGATIVE  NEGATIVE mg/dL   Urobilinogen, UA 1.0  0.0 - 1.0 mg/dL   Nitrite NEGATIVE  NEGATIVE   Leukocytes, UA NEGATIVE  NEGATIVE  URINE MICROSCOPIC-ADD ON   Collection Time    06/26/12 12:25 PM      Result Value Range   Squamous Epithelial / LPF FEW (*) RARE   WBC, UA 0-2  <3 WBC/hpf   RBC / HPF 0-2  <3 RBC/hpf   Bacteria, UA RARE  RARE   Urine-Other MUCOUS PRESENT    CBC WITH DIFFERENTIAL   Collection Time    06/26/12  1:35 PM      Result Value Range   WBC 10.4  4.0 - 10.5 K/uL   RBC 3.93  3.87 - 5.11 MIL/uL   Hemoglobin 9.1 (*) 12.0 - 15.0 g/dL   HCT 40.9 (*) 81.1 - 91.4 %   MCV 72.0 (*) 78.0 - 100.0 fL   MCH 23.2 (*) 26.0 - 34.0 pg   MCHC 32.2  30.0 - 36.0 g/dL   RDW 78.2 (*) 95.6 - 21.3 %   Platelets 271  150 -  400 K/uL   Neutrophils Relative 74  43 - 77 %   Neutro Abs 7.7  1.7 - 7.7 K/uL   Lymphocytes Relative 18  12 - 46 %   Lymphs Abs 1.8  0.7 - 4.0 K/uL   Monocytes Relative 5  3 - 12 %   Monocytes Absolute 0.5  0.1 - 1.0 K/uL   Eosinophils Relative 3  0 - 5 %   Eosinophils Absolute 0.4  0.0 - 0.7 K/uL   Basophils Relative 0  0 - 1 %   Basophils Absolute 0.0  0.0 - 0.1 K/uL  COMPREHENSIVE METABOLIC PANEL   Collection Time    06/26/12  1:35 PM      Result Value Range   Sodium 134 (*) 135 - 145 mEq/L   Potassium 3.6  3.5 - 5.1 mEq/L   Chloride 103  96 - 112 mEq/L   CO2 19  19 - 32 mEq/L   Glucose, Bld 70  70 - 99 mg/dL   BUN <3 (*) 6 - 23 mg/dL   Creatinine, Ser 6.21 (*) 0.50 - 1.10 mg/dL   Calcium 8.9  8.4 - 30.8 mg/dL   Total Protein 6.1  6.0 - 8.3 g/dL   Albumin 2.8 (*) 3.5 - 5.2 g/dL   AST 15  0 - 37 U/L   ALT 6  0 - 35 U/L   Alkaline Phosphatase 198 (*) 39 - 117 U/L   Total Bilirubin 0.5  0.3 - 1.2 mg/dL   GFR calc non Af Amer >90  >90 mL/min   GFR calc Af Amer >90  >90 mL/min  LIPASE, BLOOD    Collection Time    06/26/12  1:35 PM      Result Value Range   Lipase 25  11 - 59 U/L  TYPE AND SCREEN   Collection Time    06/26/12  6:25 PM      Result Value Range   ABO/RH(D) A POS     Antibody Screen NEG     Sample Expiration 06/29/2012      Medications:  Scheduled . docusate sodium  100 mg Oral Daily  . prenatal multivitamin  1 tablet Oral Q1200   I have reviewed the patient's current medications.  ASSESSMENT: Patient Active Problem List  Diagnosis  . H. pylori infection  . Generalized headaches  . Encounter for insertion of mirena IUD  . Pregnancy with generalized abdominal pain, antepartum  . Cholelithiasis complicating pregnancy, antepartum  . Pica in adults  . Preterm labor without delivery in third trimester  . Anemia complicating pregnancy in third trimester    PLAN: 22 yo G2P1001 at 36 weeks 1 day with cholelithiasis and vomiting.  Pain better and pt does not appear septic or in distress.  Pt only vomited x1 this morning after eggs adn [place] bacon  1-liquid diet 2-Spoke with MFM, will hold TPN, if pt is needing TPN will proceed with delivery. 3-Gen surgery re consulted who suggests rehydration, liquid diet and antiemetics. 4-If patient worsens or develops a fever, will proceed with induction.  Tarik Teixeira H. 06/27/2012,9:34 AM

## 2012-06-27 NOTE — Progress Notes (Addendum)
PARENTERAL NUTRITION CONSULT NOTE - INITIAL  Pharmacy Consult for TPN Indication: Indadequate oral intake due to cholelithiasis  Allergies  Allergen Reactions  . Ambien (Zolpidem Tartrate) Other (See Comments)    Caused her to have hallucinations  . Amoxicillin Nausea And Vomiting and Other (See Comments)    Tics & twitches  . Dilaudid (Hydromorphone Hcl) Itching  . Percocet (Oxycodone-Acetaminophen) Other (See Comments)    My arm was burning    Patient Measurements: Height: 5\' 1"  (154.9 cm) Weight: 218 lb (98.884 kg) IBW/kg (Calculated) : 47.8kg Pre-Pregnancy Weight: 73 kg  Vital Signs: Temp: 97.7 F (36.5 C) (04/12 0540) Temp src: Oral (04/12 0540) BP: 101/50 mmHg (04/12 0540) Pulse Rate: 103 (04/12 0540)       Labs:  Recent Labs  06/24/12 1151 06/26/12 1335  WBC 11.1* 10.4  HGB 9.4* 9.1*  HCT 29.1* 28.3*  PLT 285 271     Recent Labs  06/24/12 1151 06/26/12 1335  NA 136 134*  K 3.6 3.6  CL 104 103  CO2 19 19  GLUCOSE 75 70  BUN 4* <3*  CREATININE 0.40* 0.43*  CALCIUM 8.9 8.9  PROT 6.3 6.1  ALBUMIN 2.8* 2.8*  AST 15 15  ALT 6 6  ALKPHOS 193* 198*  BILITOT 0.3 0.5   Estimated Creatinine Clearance: 117.8 ml/min (by C-G formula based on Cr of 0.43).    Medical History: Past Medical History  Diagnosis Date  . UTI (lower urinary tract infection)   . Hypertension     GHTN with last pg  . Pregnancy induced hypertension   . Headache   . Gallstones     Medications:  Scheduled:  . docusate sodium  100 mg Oral Daily  . [COMPLETED] fentaNYL  100 mcg Intravenous Once  . [COMPLETED] fentaNYL  50 mcg Intravenous Once  . [COMPLETED] lactated ringers  1,000 mL Intravenous Once  . prenatal multivitamin  1 tablet Oral Q1200  . [DISCONTINUED] lactated ringers  1,000 mL Intravenous Once   Infusions:  . lactated ringers 125 mL/hr at 06/27/12 1610   PRN: acetaminophen, calcium carbonate, docusate sodium, fentaNYL, metoCLOPramide (REGLAN) injection,  ondansetron (ZOFRAN) IV, oxyCODONE-acetaminophen, promethazine  Insulin Requirements in the past 24 hours:  None  Current Nutrition:  Clear liquid diet  Assessment: 36 2/[redacted] weeks gestation; nausea & vomiting with dehydration due to cholelithiasis Because pt has had negligible oral intake for > 5 days, she is at risk for refeeding syndrome.  Will increase pt slowly to goal caloric & protein intake.  Nutritional Goals:  1900-2100 kCal, 80-95 grams of protein per day  Plan:  Will begin TPN with Clinimix 5/20 at 40 ml/hr with a goal rate of 80 ml/hr. Will decrease maintenance IV fluid rate when TPN is started No MVI, trace elements, or lipids today - only MWF Labs in am CBG's Q8 hr with sensitive sliding scale  Natasha Bence 06/27/2012,8:55 AM

## 2012-06-28 ENCOUNTER — Encounter (HOSPITAL_COMMUNITY): Payer: Self-pay | Admitting: Anesthesiology

## 2012-06-28 ENCOUNTER — Inpatient Hospital Stay (HOSPITAL_COMMUNITY): Payer: Medicaid Other | Admitting: Anesthesiology

## 2012-06-28 LAB — COMPREHENSIVE METABOLIC PANEL
ALT: 6 U/L (ref 0–35)
AST: 18 U/L (ref 0–37)
Albumin: 2.4 g/dL — ABNORMAL LOW (ref 3.5–5.2)
Alkaline Phosphatase: 176 U/L — ABNORMAL HIGH (ref 39–117)
BUN: <3 mg/dL — ABNORMAL LOW (ref 6–23)
CO2: 21 mEq/L (ref 19–32)
Calcium: 8.5 mg/dL (ref 8.4–10.5)
Chloride: 105 mEq/L (ref 96–112)
Creatinine, Ser: 0.48 mg/dL — ABNORMAL LOW (ref 0.50–1.10)
GFR calc Af Amer: >90 mL/min (ref >90)
GFR calc non Af Amer: >90 mL/min (ref >90)
Glucose, Bld: 75 mg/dL (ref 70–99)
Potassium: 3.2 mEq/L — ABNORMAL LOW (ref 3.5–5.1)
Sodium: 134 mEq/L — ABNORMAL LOW (ref 135–145)
Total Bilirubin: 0.3 mg/dL (ref 0.3–1.2)
Total Protein: 5.4 g/dL — ABNORMAL LOW (ref 6.0–8.3)

## 2012-06-28 LAB — URINE MICROSCOPIC-ADD ON

## 2012-06-28 LAB — CBC
HCT: 26.9 % — ABNORMAL LOW (ref 36.0–46.0)
Hemoglobin: 8.5 g/dL — ABNORMAL LOW (ref 12.0–15.0)
MCH: 23 pg — ABNORMAL LOW (ref 26.0–34.0)
MCHC: 31.6 g/dL (ref 30.0–36.0)
MCV: 72.7 fL — ABNORMAL LOW (ref 78.0–100.0)
Platelets: 238 10*3/uL (ref 150–400)
RBC: 3.7 MIL/uL — ABNORMAL LOW (ref 3.87–5.11)
RDW: 16 % — ABNORMAL HIGH (ref 11.5–15.5)
WBC: 9.4 10*3/uL (ref 4.0–10.5)

## 2012-06-28 LAB — URINALYSIS, ROUTINE W REFLEX MICROSCOPIC
Bilirubin Urine: NEGATIVE
Glucose, UA: NEGATIVE mg/dL
Ketones, ur: >80 mg/dL — AB
Nitrite: NEGATIVE
Protein, ur: NEGATIVE mg/dL
Specific Gravity, Urine: 1.02 (ref 1.005–1.030)
Urobilinogen, UA: 1 mg/dL (ref 0.0–1.0)
pH: 6.5 (ref 5.0–8.0)

## 2012-06-28 LAB — LIPASE, BLOOD: Lipase: 21 U/L (ref 11–59)

## 2012-06-28 LAB — AMYLASE: Amylase: 29 U/L (ref 0–105)

## 2012-06-28 LAB — RPR: RPR Ser Ql: NONREACTIVE

## 2012-06-28 MED ORDER — DIPHENHYDRAMINE HCL 50 MG/ML IJ SOLN
12.5000 mg | INTRAMUSCULAR | Status: DC | PRN
  Administered 2012-06-28 (×2): 12.5 mg via INTRAVENOUS
  Filled 2012-06-28 (×2): qty 1

## 2012-06-28 MED ORDER — OXYTOCIN BOLUS FROM INFUSION: 500.0000 mL | INTRAVENOUS | Status: DC

## 2012-06-28 MED ORDER — FENTANYL 2.5 MCG/ML BUPIVACAINE 1/10 % EPIDURAL INFUSION (WH - ANES)
INTRAMUSCULAR | Status: DC | PRN
  Administered 2012-06-28: 14 mL/h via EPIDURAL

## 2012-06-28 MED ORDER — IBUPROFEN 600 MG PO TABS: 600.0000 mg | ORAL_TABLET | Freq: Four times a day (QID) | ORAL | Status: DC | PRN

## 2012-06-28 MED ORDER — PHENYLEPHRINE 40 MCG/ML (10ML) SYRINGE FOR IV PUSH (FOR BLOOD PRESSURE SUPPORT): 80.0000 ug | PREFILLED_SYRINGE | INTRAVENOUS | Status: DC | PRN

## 2012-06-28 MED ORDER — FENTANYL 2.5 MCG/ML BUPIVACAINE 1/10 % EPIDURAL INFUSION (WH - ANES): 14.0000 mL/h | INTRAMUSCULAR | Status: DC | PRN

## 2012-06-28 MED ORDER — PHENYLEPHRINE 40 MCG/ML (10ML) SYRINGE FOR IV PUSH (FOR BLOOD PRESSURE SUPPORT)
80.0000 ug | PREFILLED_SYRINGE | INTRAVENOUS | Status: DC | PRN
  Filled 2012-06-28: qty 5

## 2012-06-28 MED ORDER — ONDANSETRON HCL 4 MG/2ML IJ SOLN
4.0000 mg | Freq: Four times a day (QID) | INTRAMUSCULAR | Status: DC | PRN
  Administered 2012-06-28: 4 mg via INTRAVENOUS
  Filled 2012-06-28: qty 2

## 2012-06-28 MED ORDER — EPHEDRINE 5 MG/ML INJ
10.0000 mg | INTRAVENOUS | Status: DC | PRN
  Filled 2012-06-28: qty 4

## 2012-06-28 MED ORDER — FENTANYL 2.5 MCG/ML BUPIVACAINE 1/10 % EPIDURAL INFUSION (WH - ANES)
14.0000 mL/h | INTRAMUSCULAR | Status: DC | PRN
  Filled 2012-06-28 (×2): qty 125

## 2012-06-28 MED ORDER — NALBUPHINE SYRINGE 5 MG/0.5 ML
10.0000 mg | INJECTION | INTRAMUSCULAR | Status: DC | PRN
  Administered 2012-06-28: 10 mg via INTRAVENOUS
  Filled 2012-06-28 (×2): qty 0.5

## 2012-06-28 MED ORDER — NALBUPHINE SYRINGE 5 MG/0.5 ML
5.0000 mg | INJECTION | INTRAMUSCULAR | Status: DC | PRN
  Administered 2012-06-28: 5 mg via INTRAVENOUS
  Filled 2012-06-28 (×2): qty 0.5

## 2012-06-28 MED ORDER — NALBUPHINE HCL 10 MG/ML IJ SOLN
5.0000 mg | Freq: Once | INTRAMUSCULAR | Status: AC | PRN
  Administered 2012-06-28: 5 mg via INTRAVENOUS

## 2012-06-28 MED ORDER — FLEET ENEMA 7-19 GM/118ML RE ENEM: 1.0000 | ENEMA | RECTAL | Status: DC | PRN

## 2012-06-28 MED ORDER — EPHEDRINE 5 MG/ML INJ: 10.0000 mg | INTRAVENOUS | Status: DC | PRN

## 2012-06-28 MED ORDER — TERBUTALINE SULFATE 1 MG/ML IJ SOLN: 0.2500 mg | Freq: Once | INTRAMUSCULAR | Status: AC | PRN

## 2012-06-28 MED ORDER — OXYCODONE-ACETAMINOPHEN 5-325 MG PO TABS: 1.0000 | ORAL_TABLET | ORAL | Status: DC | PRN

## 2012-06-28 MED ORDER — NALBUPHINE HCL 10 MG/ML IJ SOLN: 5.0000 mg | INTRAMUSCULAR | Status: DC | PRN

## 2012-06-28 MED ORDER — MISOPROSTOL 25 MCG QUARTER TABLET
25.0000 ug | ORAL_TABLET | ORAL | Status: DC | PRN
  Administered 2012-06-28: 25 ug via VAGINAL
  Filled 2012-06-28 (×2): qty 0.25

## 2012-06-28 MED ORDER — LIDOCAINE HCL (PF) 1 % IJ SOLN: 30.0000 mL | INTRAMUSCULAR | Status: DC | PRN

## 2012-06-28 MED ORDER — OXYTOCIN 40 UNITS IN LACTATED RINGERS INFUSION - SIMPLE MED
1.0000 m[IU]/min | INTRAVENOUS | Status: DC
  Administered 2012-06-28: 2 m[IU]/min via INTRAVENOUS

## 2012-06-28 MED ORDER — OXYTOCIN 40 UNITS IN LACTATED RINGERS INFUSION - SIMPLE MED
62.5000 mL/h | INTRAVENOUS | Status: DC
  Filled 2012-06-28: qty 1000

## 2012-06-28 MED ORDER — CITRIC ACID-SODIUM CITRATE 334-500 MG/5ML PO SOLN
30.0000 mL | ORAL | Status: DC | PRN
  Administered 2012-06-28: 30 mL via ORAL
  Filled 2012-06-28: qty 15

## 2012-06-28 MED ORDER — LACTATED RINGERS IV SOLN
INTRAVENOUS | Status: DC
  Administered 2012-06-28: 300 mL via INTRAUTERINE
  Administered 2012-06-28: 1000 mL via INTRAUTERINE

## 2012-06-28 MED ORDER — BUPIVACAINE HCL (PF) 0.25 % IJ SOLN
INTRAMUSCULAR | Status: DC | PRN
  Administered 2012-06-28: 4 mL

## 2012-06-28 MED ORDER — OXYCODONE-ACETAMINOPHEN 5-325 MG/5ML PO SOLN
5.0000 mL | ORAL | Status: DC | PRN
  Administered 2012-06-28: 10 mL via ORAL
  Administered 2012-06-29: 5 mL via ORAL
  Administered 2012-06-30 (×2): 10 mL via ORAL
  Administered 2012-07-01 (×3): 5 mL via ORAL
  Filled 2012-06-28 (×5): qty 5
  Filled 2012-06-28: qty 10
  Filled 2012-06-28: qty 5
  Filled 2012-06-28: qty 10

## 2012-06-28 MED ORDER — LACTATED RINGERS IV SOLN: 500.0000 mL | Freq: Once | INTRAVENOUS | Status: DC

## 2012-06-28 MED ORDER — LACTATED RINGERS IV SOLN
500.0000 mL | INTRAVENOUS | Status: DC | PRN
  Administered 2012-06-28: 500 mL via INTRAVENOUS

## 2012-06-28 MED ORDER — LACTATED RINGERS IV SOLN
INTRAVENOUS | Status: DC
  Administered 2012-06-28 (×3): via INTRAVENOUS

## 2012-06-28 MED ORDER — LIDOCAINE HCL (PF) 1 % IJ SOLN
INTRAMUSCULAR | Status: DC | PRN
  Administered 2012-06-28 (×4): 4 mL

## 2012-06-28 NOTE — Progress Notes (Signed)
Maternal heart rate noted with position change, efm adjusted

## 2012-06-28 NOTE — Progress Notes (Signed)
Reisha Wos is a 22 y.o. G2P1001 at [redacted]w[redacted]d who is admitted for intractable nausea/vomiting/pain from cholelithiasis.     Subjective: Feels well w/ Epidural. Slight itching. Complaining of being hungry  Objective: BP 126/63  Pulse 112  Temp(Src) 98.9 F (37.2 C) (Oral)  Resp 18  Ht 5\' 1"  (1.549 m)  Wt 98.884 kg (218 lb)  BMI 41.21 kg/m2  SpO2 100%  LMP 10/17/2011      FHT:  FHR: 120s bpm, variability: moderate,  accelerations:  Present,  decelerations:  Absent UC:   regular, every 2 minutes SVE:   Dilation: 5 Effacement (%): 70 Station: -1 Exam by:: [employer]: Lab Results  Component Value Date   WBC 9.4 06/28/2012   HGB 8.5* 06/28/2012   HCT 26.9* 06/28/2012   MCV 72.7* 06/28/2012   PLT 238 06/28/2012    Assessment / Plan: Induction of labor due to intractable n/v from cholelithiasis, progressing well on pitocin   Labor: Progressing normally, starting Pitocin  Fetal Wellbeing: Category I  Pain Control: Epidural  I/D: GBS Neg  Anticipated MOD: NSVD   Yovany Clock, MD Family Medicine Resident PGY2 06/28/2012, 6:52 PM

## 2012-06-28 NOTE — Anesthesia Preprocedure Evaluation (Signed)
Anesthesia Evaluation  Patient identified by MRN, date of birth, ID band Patient awake    Reviewed: Allergy & Precautions, H&P , NPO status , Patient's Chart, lab work & pertinent test results, reviewed documented beta blocker date and time   History of Anesthesia Complications Negative for: history of anesthetic complications  Airway Mallampati: III TM Distance: >3 FB Neck ROM: full    Dental  (+) Teeth Intact   Pulmonary neg pulmonary ROS,  breath sounds clear to auscultation        Cardiovascular hypertension, Rhythm:regular Rate:Normal     Neuro/Psych  Headaches, negative psych ROS   GI/Hepatic Neg liver ROS, hiatal hernia,   Endo/Other  Morbid obesity  Renal/GU negative Renal ROS     Musculoskeletal   Abdominal   Peds  Hematology negative hematology ROS (+)   Anesthesia Other Findings   Reproductive/Obstetrics (+) Pregnancy                           Anesthesia Physical  Anesthesia Plan  ASA: II  Anesthesia Plan: Epidural   Post-op Pain Management:    Induction:   Airway Management Planned:   Additional Equipment:   Intra-op Plan:   Post-operative Plan:   Informed Consent: I have reviewed the patients History and Physical, chart, labs and discussed the procedure including the risks, benefits and alternatives for the proposed anesthesia with the patient or authorized representative who has indicated his/her understanding and acceptance.     Plan Discussed with:   Anesthesia Plan Comments:         Anesthesia Quick Evaluation

## 2012-06-28 NOTE — Progress Notes (Signed)
Dymin Dingledine is a 22 y.o. G2P1001 at [redacted]w[redacted]d who is admitted for intractable nausea/vomiting/pain from cholelithiasis. Estimated Date of Delivery: 07/23/12  Fetal presentation is cephalic by bedside ultrasound   Subjective: Doing well. In pain.   Objective: BP 116/55  Pulse 93  Temp(Src) 98.5 F (36.9 C) (Oral)  Resp 18  Ht 5\' 1"  (1.549 m)  Wt 98.884 kg (218 lb)  BMI 41.21 kg/m2  SpO2 100%  LMP 10/17/2011      FHT:  FHR: 140s bpm, variability: moderate,  accelerations:  Present,  decelerations:  Absent UC:   irregular, every 2-4 minutes SVE:   Dilation: 4 Effacement (%): 50 Station: -2 Exam by:: Dr. Konrad Dolores  Labs: Lab Results  Component Value Date   WBC 9.4 06/28/2012   HGB 8.5* 06/28/2012   HCT 26.9* 06/28/2012   MCV 72.7* 06/28/2012   PLT 238 06/28/2012    Assessment / Plan: Induction of labor due to intractable n/v from cholelithiasis,  progressing well on pitocin  Labor: Progressing normally, starting Pitocin Fetal Wellbeing:  Category I Pain Control:  Requesting Epidural I/D:  GBS Neg Anticipated MOD:  NSVD  Presley Summerlin, MD Family Medicine Resident PGY-2 06/28/2012, 2:38 PM

## 2012-06-28 NOTE — Progress Notes (Signed)
Late entry: Patient labor reviewed, she's progressing slowly, and epidural in place. Fetal heart rate cat I.  Anticipate SVD.

## 2012-06-28 NOTE — Anesthesia Procedure Notes (Signed)
Epidural Patient location during procedure: OB Start time: 06/28/2012 3:30 PM End time: 06/28/2012 3:45 PM  Staffing Anesthesiologist: Lewie Loron R Performed by: anesthesiologist   Preanesthetic Checklist Completed: patient identified, site marked, pre-op evaluation, timeout performed, IV checked, risks and benefits discussed and monitors and equipment checked  Epidural Patient position: sitting Prep: DuraPrep Patient monitoring: heart rate, continuous pulse ox and blood pressure Approach: midline Injection technique: LOR air and LOR saline  Needle:  Needle type: Tuohy  Needle gauge: 17 G Needle length: 9 cm Needle insertion depth: 9 cm Catheter type: closed end flexible Catheter size: 19 Gauge Catheter at skin depth: 15 cm  Assessment Sensory level: T8 Events: blood not aspirated, injection not painful, no injection resistance, negative IV test and no paresthesia  Additional Notes Reason for block:procedure for pain

## 2012-06-28 NOTE — Progress Notes (Signed)
Emma Chambers is a 22 y.o. G2P1001 at [redacted]w[redacted]d Subjective: Anesthesia just in to redose due to increase low abd/pelvic pain; feeling a little better with ctx at this point  Objective: BP 117/64  Pulse 112  Temp(Src) 99 F (37.2 C) (Axillary)  Resp 20  Ht 5\' 1"  (1.549 m)  Wt 218 lb (98.884 kg)  BMI 41.21 kg/m2  SpO2 100%  LMP 10/17/2011   Total I/O In: -  Out: 450 [Urine:450]  FHT:  FHR: 135 bpm, variability: moderate,  accelerations:  Present,  decelerations:  Present having some early onset variables to 90s with most recent ctx UC:   regular, every 3-4 minutes with Pitocin SVE:  6+/90/vtx -2  IUPC inserted without difficulty  Labs: Lab Results  Component Value Date   WBC 9.4 06/28/2012   HGB 8.5* 06/28/2012   HCT 26.9* 06/28/2012   MCV 72.7* 06/28/2012   PLT 238 06/28/2012    Assessment / Plan: Active labor FHR changes  Will start amnioinfusion and watch FHR closely  SHAW, KIMBERLY 06/28/2012, 11:34 PM

## 2012-06-28 NOTE — Progress Notes (Addendum)
Called dr leggett, informed of pt still needing meds for pain and nausea, unable to keep down fluid, just eating ice chip, orders received and will discuss induction with pt tomorrow. To continue to keep pt on meds as needed

## 2012-06-28 NOTE — H&P (Signed)
   Attestation of Attending Supervision of Advanced Practitioner: Evaluation and management procedures were performed by the PA/NP/CNM/OB Fellow under my supervision/collaboration. Chart reviewed and agree with management and plan. Care assumed from Dr Erin Fulling, with current plan to hydrate overnight, consider placement of PICC line, and continue pregnancy for up to another week before inducing labor.  Will discuss further as pt responds to initial care. Tilda Burrow 06/28/2012 4:47 PM

## 2012-06-28 NOTE — Progress Notes (Signed)
Patient ID: Emma Chambers, female   DOB: [birth year], 22 y.o.   MRN: [id number] FACULTY PRACTICE ANTEPARTUM COMPREHENSIVE PROGRESS NOTE  Velecia Ovitt is a 22 y.o. G2P1001 at [redacted]w[redacted]d  who is admitted for intractable nausea/vomiting/pain from cholelithiasis.  Estimated Date of Delivery: 07/23/12 Fetal presentation is cephalic by bedside ultrasound  Length of Stay:  2 Days. 06/26/2012  Subjective: Pt still c/o pain and vomiting/nausea. She has recurrent emesis yesterday and needed pain medication 5 times in last 24 hours.  Patient reports good fetal movement.  She reports no uterine contractions, no bleeding and no loss of fluid per vagina.  Vitals:  Blood pressure 101/50, pulse 98, temperature 98.3 F (36.8 C), temperature source Oral, resp. rate 20, height 5\' 1"  (1.549 m), weight 98.884 kg (218 lb), last menstrual period 10/17/2011, SpO2 100.00%. Physical Examination: General appearance - alert, well appearing, and in no distress Heart - normal rate, regular rhythm, normal S1, S2, no murmurs, rubs, clicks or gallops Abdomen - Obese, gravid (size appropriate for dates), non-distended, epigastric/RUQ tenderness Neurological - no focal deficits Extremities - peripheral pulses normal, no pedal edema, no clubbing or cyanosis Membranes:intact  Dilation: 1.5 Effacement (%): 50 Cervical Position: Posterior Station: Ballotable Presentation: Undeterminable Exam by:: dr. leggett   Fetal Monitoring:  Baseline: 135 bpm, Variability: Good {> 6 bpm), Accelerations: Reactive and Decelerations: occasional mild variable  Labs:  Results for orders placed during the hospital encounter of 06/26/12 (from the past 24 hour(s))  CBC   Collection Time    06/28/12  6:00 AM      Result Value Range   WBC 9.4  4.0 - 10.5 K/uL   RBC 3.70 (*) 3.87 - 5.11 MIL/uL   Hemoglobin 8.5 (*) 12.0 - 15.0 g/dL   HCT 40.9 (*) 81.1 - 91.4 %   MCV 72.7 (*) 78.0 - 100.0 fL   MCH 23.0 (*) 26.0 - 34.0 pg   MCHC 31.6  30.0 -  36.0 g/dL   RDW 78.2 (*) 95.6 - 21.3 %   Platelets 238  150 - 400 K/uL  URINALYSIS, ROUTINE W REFLEX MICROSCOPIC   Collection Time    06/28/12  6:50 AM      Result Value Range   Color, Urine YELLOW  YELLOW   APPearance HAZY (*) CLEAR   Specific Gravity, Urine 1.020  1.005 - 1.030   pH 6.5  5.0 - 8.0   Glucose, UA NEGATIVE  NEGATIVE mg/dL   Hgb urine dipstick TRACE (*) NEGATIVE   Bilirubin Urine NEGATIVE  NEGATIVE   Ketones, ur >80 (*) NEGATIVE mg/dL   Protein, ur NEGATIVE  NEGATIVE mg/dL   Urobilinogen, UA 1.0  0.0 - 1.0 mg/dL   Nitrite NEGATIVE  NEGATIVE   Leukocytes, UA LARGE (*) NEGATIVE  URINE MICROSCOPIC-ADD ON   Collection Time    06/28/12  6:50 AM      Result Value Range   Squamous Epithelial / LPF MANY (*) RARE   WBC, UA 3-6  <3 WBC/hpf   RBC / HPF 0-2  <3 RBC/hpf   Bacteria, UA RARE  RARE   Urine-Other MUCOUS PRESENT      Imaging Studies:    None recent   Medications:  Scheduled . docusate sodium  100 mg Oral Daily  . prenatal multivitamin  1 tablet Oral Q1200   PRN meds: acetaminophen, calcium carbonate, docusate sodium, fentaNYL, metoCLOPramide (REGLAN) injection, ondansetron (ZOFRAN) IV, oxyCODONE-acetaminophen, promethazine  I have reviewed the patient's current medications.  ASSESSMENT: Patient Active Problem List  Diagnosis  . H. pylori infection  . Generalized headaches  . Encounter for insertion of mirena IUD  . Pregnancy with generalized abdominal pain, antepartum  . Cholelithiasis complicating pregnancy, antepartum  . Pica in adults  . Preterm labor without delivery in third trimester  . Anemia complicating pregnancy in third trimester    PLAN: Move to L&D for induction of labor Cytotec, then foley bulb, then pitocin as needed.  Napoleon Form, MD

## 2012-06-29 ENCOUNTER — Encounter: Payer: Medicaid Other | Admitting: Family Medicine

## 2012-06-29 ENCOUNTER — Encounter (HOSPITAL_COMMUNITY): Payer: Self-pay | Admitting: *Deleted

## 2012-06-29 DIAGNOSIS — D649 Anemia, unspecified: Secondary | ICD-10-CM

## 2012-06-29 DIAGNOSIS — O9902 Anemia complicating childbirth: Secondary | ICD-10-CM

## 2012-06-29 DIAGNOSIS — K8020 Calculus of gallbladder without cholecystitis without obstruction: Secondary | ICD-10-CM

## 2012-06-29 DIAGNOSIS — O26899 Other specified pregnancy related conditions, unspecified trimester: Secondary | ICD-10-CM

## 2012-06-29 MED ORDER — IBUPROFEN 600 MG PO TABS
600.0000 mg | ORAL_TABLET | Freq: Four times a day (QID) | ORAL | Status: DC
  Administered 2012-06-29 – 2012-07-01 (×8): 600 mg via ORAL
  Filled 2012-06-29 (×9): qty 1

## 2012-06-29 MED ORDER — ONDANSETRON HCL 4 MG/2ML IJ SOLN: 4.0000 mg | INTRAMUSCULAR | Status: DC | PRN

## 2012-06-29 MED ORDER — TETANUS-DIPHTH-ACELL PERTUSSIS 5-2.5-18.5 LF-MCG/0.5 IM SUSP: 0.5000 mL | Freq: Once | INTRAMUSCULAR | Status: DC

## 2012-06-29 MED ORDER — LANOLIN HYDROUS EX OINT: TOPICAL_OINTMENT | CUTANEOUS | Status: DC | PRN

## 2012-06-29 MED ORDER — SIMETHICONE 80 MG PO CHEW: 80.0000 mg | CHEWABLE_TABLET | ORAL | Status: DC | PRN

## 2012-06-29 MED ORDER — DIPHENHYDRAMINE HCL 25 MG PO CAPS: 25.0000 mg | ORAL_CAPSULE | Freq: Four times a day (QID) | ORAL | Status: DC | PRN

## 2012-06-29 MED ORDER — BENZOCAINE-MENTHOL 20-0.5 % EX AERO
1.0000 "application " | INHALATION_SPRAY | CUTANEOUS | Status: DC | PRN
  Administered 2012-06-30: 1 via TOPICAL
  Filled 2012-06-29 (×2): qty 56

## 2012-06-29 MED ORDER — WITCH HAZEL-GLYCERIN EX PADS: 1.0000 "application " | MEDICATED_PAD | CUTANEOUS | Status: DC | PRN

## 2012-06-29 MED ORDER — SENNOSIDES-DOCUSATE SODIUM 8.6-50 MG PO TABS
2.0000 | ORAL_TABLET | Freq: Every day | ORAL | Status: DC
  Administered 2012-06-29 – 2012-06-30 (×2): 2 via ORAL

## 2012-06-29 MED ORDER — DIBUCAINE 1 % RE OINT
1.0000 "application " | TOPICAL_OINTMENT | RECTAL | Status: DC | PRN
  Filled 2012-06-29: qty 28

## 2012-06-29 MED ORDER — ONDANSETRON HCL 4 MG PO TABS: 4.0000 mg | ORAL_TABLET | ORAL | Status: DC | PRN

## 2012-06-29 MED ORDER — PRENATAL MULTIVITAMIN CH
1.0000 | ORAL_TABLET | Freq: Every day | ORAL | Status: DC
  Administered 2012-06-29 – 2012-07-01 (×3): 1 via ORAL
  Filled 2012-06-29 (×3): qty 1

## 2012-06-29 NOTE — Progress Notes (Signed)
Pt w/ minimal RUQ pain this am. Scheduled for GYN clinic appt on 4/28 at 14:00. Scheduled for Surgery clinic appt on 5/2 at 08:30. (Dr. Biagio Quint to review case and try to get pt in sooner for appt if needed) Pt to sign BTL papers during current admission so that pt can undergo laparoscopic cholecystectomy/BTL simultaneously   Shelly Flatten, MD Family Medicine PGY-2 06/29/2012, 10:34 AM

## 2012-06-29 NOTE — Anesthesia Postprocedure Evaluation (Signed)
Anesthesia Post Note  Patient: Emma Chambers  Procedure(s) Performed: * No procedures listed *  Anesthesia type: Epidural  Patient location: Mother/Baby  Post pain: Pain level controlled  Post assessment: Post-op Vital signs reviewed  Last Vitals:  Filed Vitals:   06/29/12 0845  BP: 101/69  Pulse: 92  Temp: 37.3 C  Resp: 20    Post vital signs: Reviewed  Level of consciousness:alert  Complications: No apparent anesthesia complications

## 2012-06-29 NOTE — Progress Notes (Signed)
UR chart review completed.  

## 2012-06-30 NOTE — Progress Notes (Signed)
Post Partum Day 1 Subjective: no complaints, up ad lib, voiding, tolerating PO and + flatus  Objective: Blood pressure 98/65, pulse 84, temperature 97.9 F (36.6 C), temperature source Oral, resp. rate 17, height 5\' 1"  (1.549 m), weight 218 lb (98.884 kg), last menstrual period 10/17/2011, SpO2 100.00%, unknown if currently breastfeeding.  Physical Exam:  General: alert and cooperative Lochia: appropriate Uterine Fundus: firm Incision: n/a DVT Evaluation: No evidence of DVT seen on physical exam. Negative Homan's sign.   Recent Labs  06/28/12 0600  HGB 8.5*  HCT 26.9*    Assessment/Plan: PPD#1, Will d/c home if baby can leave. Signed BTL papers and will have it at the time of cholecystectomy. Iron for anemia Encouraged to breastfeed.   LOS: 4 days   Tashia Leiterman H. 06/30/2012, 8:07 AM

## 2012-06-30 NOTE — Progress Notes (Signed)
Post Partum Day 1 Subjective: up ad lib, voiding, tolerating PO and contniues to have RUQ pain from gallstones  Objective: Blood pressure 98/65, pulse 84, temperature 97.9 F (36.6 C), temperature source Oral, resp. rate 17, height 5\' 1"  (1.549 m), weight 218 lb (98.884 kg), last menstrual period 10/17/2011, SpO2 100.00%, unknown if currently breastfeeding.  Physical Exam:  General: alert, cooperative and no distress Lochia: appropriate Uterine Fundus: firm Incision: n/a DVT Evaluation: No evidence of DVT seen on physical exam. Negative Homan's sign. No cords or calf tenderness.   Recent Labs  06/28/12 0600  HGB 8.5*  HCT 26.9*    Assessment/Plan: Contraception for ppBTL at time of cholecystectomy Pt wants to go home today if possible. Pt does not want to breast feed.   LOS: 4 days   LEGGETT,KELLY H. 06/30/2012, 8:36 AM

## 2012-07-01 ENCOUNTER — Other Ambulatory Visit: Payer: Self-pay | Admitting: Obstetrics and Gynecology

## 2012-07-01 MED ORDER — OXYCODONE-ACETAMINOPHEN 5-325 MG/5ML PO SOLN: 5.0000 mL | ORAL | Status: DC | PRN

## 2012-07-01 NOTE — Discharge Summary (Signed)
   Obstetric Discharge Summary Reason for Admission: Admitted for nausea and vomiting due to gallstones.  Pt symptoms did not improve and was transferred to [facility] for induction of labor.   Prenatal Procedures: NST and ultrasound Intrapartum Procedures: spontaneous vaginal delivery Postpartum Procedures: none Complications-Operative and Postpartum: none  Pt reports improvement in abdominal pain;  Bottle-feeding.  Plans tubal ligation during gallbladder surgery.    Hemoglobin  Date Value Range Status  06/28/2012 8.5* 12.0 - 15.0 g/dL Final     HCT  Date Value Range Status  06/28/2012 26.9* 36.0 - 46.0 % Final    Physical Exam:  General: alert, cooperative and appears stated age Lochia: appropriate Uterine Fundus: firm Incision: n/a DVT Evaluation: No evidence of DVT seen on physical exam. Negative Homan's sign.  Discharge Diagnoses: Term Pregnancy-delivered and Cholelithiasis  Discharge Information: Date: 07/01/2012 Activity: pelvic rest Diet: routine Medications: Plans to take existing pain meds for gallstones.   Condition: stable Instructions: refer to practice specific booklet Discharge to: home Follow-up Information   Follow up with [facility]. Schedule an appointment as soon as possible for a visit in 4 weeks. (Call GI doctor regarding surgery date. )    Contact information:   [address] [place] [address]       Newborn Data: Live born female  Birth Weight: 5 lb 10.6 oz (2568 g) APGAR: 9, 9  Home with mother.  [facility] 07/01/2012, 6:26 AM

## 2012-07-02 ENCOUNTER — Encounter: Payer: Self-pay | Admitting: *Deleted

## 2012-07-06 ENCOUNTER — Inpatient Hospital Stay (HOSPITAL_COMMUNITY): Payer: Medicaid Other

## 2012-07-06 ENCOUNTER — Inpatient Hospital Stay (HOSPITAL_COMMUNITY)
Admission: AD | Admit: 2012-07-06 | Discharge: 2012-07-06 | Disposition: A | Discharge status: Home / Self Care | Payer: Medicaid Other | Source: Ambulatory Visit | Attending: Obstetrics and Gynecology | Admitting: Obstetrics and Gynecology

## 2012-07-06 DIAGNOSIS — K8020 Calculus of gallbladder without cholecystitis without obstruction: Secondary | ICD-10-CM | POA: Insufficient documentation

## 2012-07-06 DIAGNOSIS — L738 Other specified follicular disorders: Secondary | ICD-10-CM | POA: Insufficient documentation

## 2012-07-06 DIAGNOSIS — O9089 Other complications of the puerperium, not elsewhere classified: Secondary | ICD-10-CM | POA: Insufficient documentation

## 2012-07-06 DIAGNOSIS — L678 Other hair color and hair shaft abnormalities: Secondary | ICD-10-CM

## 2012-07-06 LAB — CBC
HCT: 28.9 % — ABNORMAL LOW (ref 36.0–46.0)
Hemoglobin: 9.2 g/dL — ABNORMAL LOW (ref 12.0–15.0)
MCH: 23.3 pg — ABNORMAL LOW (ref 26.0–34.0)
MCHC: 31.8 g/dL (ref 30.0–36.0)
MCV: 73.2 fL — ABNORMAL LOW (ref 78.0–100.0)
Platelets: 407 10*3/uL — ABNORMAL HIGH (ref 150–400)
RBC: 3.95 MIL/uL (ref 3.87–5.11)
RDW: 16.8 % — ABNORMAL HIGH (ref 11.5–15.5)
WBC: 11.6 10*3/uL — ABNORMAL HIGH (ref 4.0–10.5)

## 2012-07-06 MED ORDER — FERROUS SULFATE 325 (65 FE) MG PO TABS: 325.0000 mg | ORAL_TABLET | Freq: Every day | ORAL | Status: DC

## 2012-07-06 NOTE — MAU Note (Addendum)
Pt passing massive blood clots, brought in photo on phone, changing pad every 1 hours today, and foul order  No fever or chills, T 99,3 in triage,, delivery 4/14. Spoke to provider, orders entered

## 2012-07-06 NOTE — MAU Provider Note (Signed)
History     CSN: [id number]  Arrival date and time: 07/06/12 1714   None     Chief Complaint  Patient presents with  . Postpartum Complications   HPI Comments: Pt presents with complaint of heavy lochia.  She is a 22 yo G2P1102 who delivered on 4/16 (5 days ago) at [redacted]w[redacted]d following IOL for intractable N/V secondary to cholelithiasis.  She reports she has continued to bleed heavily and has now passed two large sized clots.  She reports her activity has been increasing over the past 3 days since she has been home.  She denies objective fevers but does report some mild flushing.  Abdominal pain due to cholelithiasis has been stable since delivery and not interfering with her ability to care for baby.  She reports that her bleeding has increased over the past 3 days since being home.  She has been having daily clots and reports soaking a pad every hour.  Small clots on a regular basis and 2 large clots per nurses description today.  Denies dizziness, lightheadedness, orthostasis.  Lower abdominal pain is moderate but not interfering with activity.       OB History   Grav Para Term Preterm Abortions TAB SAB Ect Mult Living   2 2 1 1  0 0 0 0 0 2      Past Medical History  Diagnosis Date  . UTI (lower urinary tract infection)   . Hypertension     GHTN with last pg  . Pregnancy induced hypertension   . Headache   . Gallstones     Past Surgical History  Procedure Laterality Date  . No past surgeries      Family History  Problem Relation Age of Onset  . Asthma Mother   . Hypertension Mother   . Parkinsonism Mother   . Diabetes Maternal Grandmother   . Hypertension Maternal Grandmother   . Stroke Maternal Grandmother   . Anesthesia problems Neg Hx   . Malignant hyperthermia Neg Hx   . Pseudochol deficiency Neg Hx   . Hypotension Neg Hx   . Asthma Brother     History  Substance Use Topics  . Smoking status: Never Smoker   . Smokeless tobacco: Never Used  . Alcohol Use:  No    Allergies:  Allergies  Allergen Reactions  . Ambien (Zolpidem Tartrate) Other (See Comments)    Caused her to have hallucinations  . Amoxicillin Nausea And Vomiting and Other (See Comments)    Tics & twitches  . Dilaudid (Hydromorphone Hcl) Itching    Prescriptions prior to admission  Medication Sig Dispense Refill  . docusate sodium (COLACE) 100 MG capsule Take 1 capsule (100 mg total) by mouth 2 (two) times daily as needed for constipation.  30 capsule  2  . lansoprazole (PREVACID) 30 MG capsule Take 1 capsule (30 mg total) by mouth 2 (two) times daily.  40 capsule  1  . Menthol, Topical Analgesic, (ICY HOT PAIN RELIEVING EX) Apply 1 application topically daily as needed (for back pain).      Marland Kitchen metoCLOPramide (REGLAN) 10 MG tablet Take 1 tablet (10 mg total) by mouth once.  60 tablet  1  . oxyCODONE-acetaminophen (ROXICET) 5-325 MG/5ML solution Take 5-10 mLs by mouth every 4 (four) hours as needed for pain.  500 mL  0  . oxyCODONE-acetaminophen (ROXICET) 5-325 MG/5ML solution Take 5-10 mLs by mouth every 4 (four) hours as needed.  500 mL  0    Review of  Systems  Constitutional: Negative.  Negative for fever and chills.  HENT: Negative.  Negative for congestion.   Eyes: Negative.  Negative for blurred vision and double vision.  Respiratory: Negative.  Negative for cough and wheezing.   Cardiovascular: Negative.  Negative for leg swelling.  Gastrointestinal: Negative.  Negative for heartburn, nausea and vomiting.  Genitourinary: Negative.  Negative for dysuria, urgency and frequency.  Musculoskeletal: Negative.  Negative for myalgias, back pain and falls.  Skin: Negative.   Neurological: Negative.  Negative for headaches.  Endo/Heme/Allergies: Negative.   Psychiatric/Behavioral: Negative.    Physical Exam   Blood pressure 116/74, pulse 102, temperature 99.3 F (37.4 C), resp. rate 18, height 5\' 1"  (1.549 m), weight 94.802 kg (209 lb), SpO2 99.00%, unknown if currently  breastfeeding.  Physical Exam  Nursing note and vitals reviewed. Constitutional: She is oriented to person, place, and time. She appears well-developed and well-nourished. No distress.  HENT:  Head: Normocephalic and atraumatic.  Eyes: Conjunctivae are normal. Pupils are equal, round, and reactive to light.  Neck: Normal range of motion. No JVD present. No tracheal deviation present.  Cardiovascular: Normal rate.   Respiratory: Effort normal. No respiratory distress.  GI: Soft. She exhibits mass. There is tenderness (mild over uterus). There is no rigidity, no rebound and no guarding.  Genitourinary:    Pelvic exam was performed with patient prone. There is lesion on the right labia. There is no rash, tenderness or injury on the right labia. There is no rash, tenderness or lesion on the left labia. Uterus is enlarged (approximately 10 week size ~ large grapefruit) and tender (mild tenderness as expected post partum). Uterus is not deviated and not fixed. There is bleeding (2 small clots with bimanual exam, no noted odor) around the vagina.  Musculoskeletal: She exhibits no edema and no tenderness.  Neurological: She is alert and oriented to person, place, and time. She exhibits normal muscle tone.  Skin: Skin is warm and dry. No rash noted. She is not diaphoretic. No erythema. No pallor.  Psychiatric: She has a normal mood and affect. Her behavior is normal. Judgment and thought content normal.    MAU Course  Procedures  MDM Pt with post partum bleeding.  DDx includes normal PP Lochia, retained product, or Endometritis.  CBC improved from prior to delivery.  Favor normal Heavy PP lochia especially given seems to have increased with increasing activity.  Will obtain US to eval for retained product.  Pt not as tender as would be expected with Endometritis.  However mild leukocytosis that could be from physiologic response to delivery vs known gall bladder disease vs endometritis.     Assessment and Plan  # Ingrown Hair on Vulva - avoid shaving # Cholelithiasis - no worsening RUQ pain, will f/u as previously scheduled # Post Partum Bleeding - No evidence of retained products on [place].  Only mild uterine tenderness not consistent with endometritis.  Likely increased bleeding due to increased activity.  Red flags reviewed for continued bleeding or worsening pain.  Andrena Mews, DO [facility] Resident - PGY-2 07/06/2012 7:13 PM  Seen also by me Agree with note Wynelle Bourgeois CNM

## 2012-07-09 NOTE — MAU Provider Note (Signed)
Attestation of Attending Supervision of Advanced Practitioner (CNM/NP): Evaluation and management procedures were performed by the Advanced Practitioner under my supervision and collaboration.  I have reviewed the Advanced Practitioner's note and chart, and I agree with the management and plan.  Emma Chambers 07/09/2012 9:32 PM

## 2012-07-13 ENCOUNTER — Ambulatory Visit: Payer: Medicaid Other | Admitting: Obstetrics & Gynecology

## 2012-07-17 ENCOUNTER — Encounter (HOSPITAL_COMMUNITY): Payer: Self-pay | Admitting: *Deleted

## 2012-07-17 ENCOUNTER — Ambulatory Visit (INDEPENDENT_AMBULATORY_CARE_PROVIDER_SITE_OTHER): Payer: Medicaid Other | Admitting: General Surgery

## 2012-07-17 ENCOUNTER — Encounter (INDEPENDENT_AMBULATORY_CARE_PROVIDER_SITE_OTHER): Payer: Self-pay | Admitting: General Surgery

## 2012-07-17 ENCOUNTER — Telehealth: Payer: Self-pay | Admitting: General Practice

## 2012-07-17 VITALS — BP 120/76 | HR 68 | Temp 98.6°F | Resp 16 | Ht 61.0 in | Wt 207.8 lb

## 2012-07-17 DIAGNOSIS — K8020 Calculus of gallbladder without cholecystitis without obstruction: Secondary | ICD-10-CM

## 2012-07-17 NOTE — Progress Notes (Signed)
Patient ID: Emma Chambers, female   DOB: Jan 06, 1991, 22 y.o.   MRN: [id number]  Chief Complaint  Patient presents with  . Re-evaluation    re-eval chole - discuss Sx    HPI Emma Chambers is a 22 y.o. female.  This patient is known to me forprior evaluation for symptomatic cholelithiasis. She has had a long-standing symptoms and her situation has been complicated by an intrauterine pregnancy however she delivered her baby about 2 weeks ago. She comes in today to discuss surgery which is already scheduled. She says that since the birth of her child she has been feeling better and hasn't had as frequent attacks.  Please refer to her prior clinic note for additional details HPI  Past Medical History  Diagnosis Date  . UTI (lower urinary tract infection)   . Hypertension     GHTN with last pg  . Pregnancy induced hypertension   . Headache   . Gallstones     Past Surgical History  Procedure Laterality Date  . No past surgeries      Family History  Problem Relation Age of Onset  . Asthma Mother   . Hypertension Mother   . Parkinsonism Mother   . Diabetes Maternal Grandmother   . Hypertension Maternal Grandmother   . Stroke Maternal Grandmother   . Anesthesia problems Neg Hx   . Malignant hyperthermia Neg Hx   . Pseudochol deficiency Neg Hx   . Hypotension Neg Hx   . Asthma Brother     Social History History  Substance Use Topics  . Smoking status: Never Smoker   . Smokeless tobacco: Never Used  . Alcohol Use: No    Allergies  Allergen Reactions  . Ambien (Zolpidem Tartrate) Other (See Comments)    Caused her to have hallucinations  . Amoxicillin Nausea And Vomiting and Other (See Comments)    Tics & twitches  . Dilaudid (Hydromorphone Hcl) Itching    Current Outpatient Prescriptions  Medication Sig Dispense Refill  . oxycodone-acetaminophen (ROXICET) 5-500 MG per tablet Take 1 tablet by mouth every 4 (four) hours as needed for pain.       No current  facility-administered medications for this visit.    Review of Systems Review of Systems All other review of systems negative or noncontributory except as stated in the HPI  Blood pressure 120/76, pulse 68, temperature 98.6 F (37 C), temperature source Oral, resp. rate 16, height 5\' 1"  (1.549 m), weight 207 lb 12.8 oz (94.257 kg), not currently breastfeeding.  Physical Exam Physical Exam Physical Exam  Nursing note and vitals reviewed. Constitutional: She is oriented to person, place, and time. She appears well-developed and well-nourished. No distress.  HENT:  Head: Normocephalic and atraumatic.  Mouth/Throat: No oropharyngeal exudate.  Eyes: Conjunctivae and EOM are normal. Pupils are equal, round, and reactive to light. Right eye exhibits no discharge. Left eye exhibits no discharge. No scleral icterus.  Neck: Normal range of motion. Neck supple. No tracheal deviation present.  Cardiovascular: Normal rate, regular rhythm, normal heart sounds and intact distal pulses.   Pulmonary/Chest: Effort normal and breath sounds normal. No stridor. No respiratory distress. She has no wheezes.  Abdominal: Soft. Bowel sounds are normal. She exhibits no distension and no mass. There is no tenderness. There is no rebound and no guarding.  Musculoskeletal: Normal range of motion. She exhibits no edema and no tenderness.  Neurological: She is alert and oriented to person, place, and time.  Skin: Skin is warm and dry.  No rash noted. She is not diaphoretic. No erythema. No pallor.  Psychiatric: She has a normal mood and affect. Her behavior is normal. Judgment and thought content normal.    Data Reviewed Labs, [place]  Assessment    Symptomatic cholelithiasis She does appear to have symptomatic cholelithiasis and I have offered her an already scheduled her for a laparoscopic cholecystectomy with possible clinic and a possible open surgery. She does have some elevated alkaline phosphatase and I wouldn't  attempt a cholangiogram at the time. She is also scheduled for tubal ligation by her gynecologist but she says that she is currently having second thoughts about this. We did discuss the surgery and its risks .The risks of infection, bleeding, pain, persistent symptoms, scarring, injury to bowel or bile ducts, retained stone, diarrhea, need for additional procedures, and need for open surgery discussed with the patient. She expressed understanding and would like to proceed with laparoscopic cholecystectomy     Plan    We will set her up for laparoscopic cholecystectomy and possible tubal ligation        Adaliz Dobis DAVID 07/17/2012, 9:06 AM

## 2012-07-17 NOTE — Telephone Encounter (Signed)
Patient called and left message stating she wants someone to call her back. Called Emma Chambers back and told her I received her message and she stated that she was supposed to have her tubes tied when she got her gallbladder out but she has changed her mind and wants to do a different form of birth control because she might want kids later on because she is only 89. Told Dr Erin Fulling who will be sending a message to [place] to cancel the surgery. Told patient we got the cancellation taken care of and that she can discuss different birth control options at her post partum appt on 6/4. Patient verbalized understanding and had no further questions

## 2012-07-21 NOTE — Progress Notes (Signed)
Will need orders in EPIC please when able - pt coming for preop Fri. 07/24/12 - Thank you

## 2012-07-22 ENCOUNTER — Encounter (HOSPITAL_COMMUNITY): Payer: Self-pay | Admitting: Pharmacy Technician

## 2012-07-23 NOTE — Progress Notes (Signed)
Emma Chambers coming for preop today at 11:30 am - could we possibly get orders on her before she gets here? Thank you

## 2012-07-24 ENCOUNTER — Encounter (HOSPITAL_COMMUNITY): Payer: Self-pay

## 2012-07-24 ENCOUNTER — Encounter (HOSPITAL_COMMUNITY)
Admission: RE | Admit: 2012-07-24 | Discharge: 2012-07-24 | Disposition: A | Discharge status: Home / Self Care | Payer: Medicaid Other | Source: Ambulatory Visit | Attending: General Surgery | Admitting: General Surgery

## 2012-07-24 LAB — CBC
HCT: 32.7 % — ABNORMAL LOW (ref 36.0–46.0)
Hemoglobin: 9.7 g/dL — ABNORMAL LOW (ref 12.0–15.0)
MCH: 21.9 pg — ABNORMAL LOW (ref 26.0–34.0)
MCHC: 29.7 g/dL — ABNORMAL LOW (ref 30.0–36.0)
MCV: 73.8 fL — ABNORMAL LOW (ref 78.0–100.0)
Platelets: 373 10*3/uL (ref 150–400)
RBC: 4.43 MIL/uL (ref 3.87–5.11)
RDW: 17 % — ABNORMAL HIGH (ref 11.5–15.5)
WBC: 8.9 10*3/uL (ref 4.0–10.5)

## 2012-07-24 LAB — SURGICAL PCR SCREEN
MRSA, PCR: NEGATIVE
Staphylococcus aureus: POSITIVE — AB

## 2012-07-24 LAB — HCG, SERUM, QUALITATIVE: Preg, Serum: NEGATIVE

## 2012-07-24 NOTE — Patient Instructions (Signed)
20      Your procedure is scheduled on:  Monday 07/27/2012  Report to [facility] at 1030 AM.  Call this number if you have problems the morning of surgery: [phone]   Remember:             IF YOU USE CPAP,BRING MASK AND TUBING AM OF SURGERY!   Do not eat food  AFTER MIDNIGHT! MAY HAVE CLEAR LIQUIDS FROM MIDNIGHT UP UNTIL 0700 AM THEN NOTHING UNTIL AFTER SURGERY!  Take these medicines the morning of surgery with A SIP OF WATER: NONE   Do not bring valuables to the hospital.  .  Leave suitcase in the car. After surgery it may be brought to your room.  For patients admitted to the hospital, checkout time is 11:00 AM the day of              Discharge.    DO NOT WEAR JEWELRY , MAKE-UP, LOTIONS,POWDERS,PERFUMES!             WOMEN -DO NOT SHAVE LEGS OR UNDERARMS 12 HRS. BEFORE SURGERY!               MEN MAY SHAVE AS USUAL!             CONTACTS,DENTURES OR BRIDGEWORK, FALSE EYELASHES MAY  NOT BE WORN INTO SURGERY!                                           Patients discharged the day of surgery will not be allowed to drive home. If going home the same day of surgery, must have someone stay with you first 24 hrs.at home and arrange for someone to drive you home from the Hospital.                         YOUR DRIVER IS: not sure of who will bring her and take her home-maybe mom-Teresa Miller   Special Instructions:             Please read over the following fact sheets that you were given:             1.  PREPARING FOR SURGERY SHEET              2.MRSA INFORMATION              3.INC[facility] SPIROMETRY                                        Telford Nab.Ghalia Reicks,RN,BSN     [phone]                FAILURE TO FOLLOW THESE INSTRUCTIONS MAY RESULT IN  CANCELLATION OF YOUR SURGERY!               Patient Signature:___________________________

## 2012-07-27 ENCOUNTER — Encounter (HOSPITAL_COMMUNITY): Payer: Self-pay | Admitting: Anesthesiology

## 2012-07-27 ENCOUNTER — Encounter (HOSPITAL_COMMUNITY)
Admission: RE | Disposition: A | Discharge status: Home / Self Care | Payer: Self-pay | Source: Ambulatory Visit | Attending: General Surgery

## 2012-07-27 ENCOUNTER — Ambulatory Visit (HOSPITAL_COMMUNITY): Payer: Medicaid Other | Admitting: Anesthesiology

## 2012-07-27 ENCOUNTER — Ambulatory Visit (HOSPITAL_COMMUNITY)
Admission: RE | Admit: 2012-07-27 | Discharge: 2012-07-27 | Disposition: A | Discharge status: Home / Self Care | Payer: Medicaid Other | Source: Ambulatory Visit | Attending: General Surgery | Admitting: General Surgery

## 2012-07-27 ENCOUNTER — Ambulatory Visit (HOSPITAL_COMMUNITY): Payer: Medicaid Other

## 2012-07-27 ENCOUNTER — Encounter (HOSPITAL_COMMUNITY): Payer: Self-pay | Admitting: *Deleted

## 2012-07-27 DIAGNOSIS — K8010 Calculus of gallbladder with chronic cholecystitis without obstruction: Secondary | ICD-10-CM | POA: Insufficient documentation

## 2012-07-27 DIAGNOSIS — K824 Cholesterolosis of gallbladder: Secondary | ICD-10-CM

## 2012-07-27 DIAGNOSIS — Z01812 Encounter for preprocedural laboratory examination: Secondary | ICD-10-CM | POA: Insufficient documentation

## 2012-07-27 DIAGNOSIS — O26899 Other specified pregnancy related conditions, unspecified trimester: Secondary | ICD-10-CM | POA: Insufficient documentation

## 2012-07-27 DIAGNOSIS — K8020 Calculus of gallbladder without cholecystitis without obstruction: Secondary | ICD-10-CM

## 2012-07-27 HISTORY — PX: CHOLECYSTECTOMY: SHX55

## 2012-07-27 SURGERY — LAPAROSCOPIC CHOLECYSTECTOMY WITH INTRAOPERATIVE CHOLANGIOGRAM
Anesthesia: General | Site: Abdomen | Wound class: Clean Contaminated

## 2012-07-27 MED ORDER — PROMETHAZINE HCL 25 MG/ML IJ SOLN
6.2500 mg | Freq: Once | INTRAMUSCULAR | Status: AC
  Administered 2012-07-27: 6.25 mg via INTRAVENOUS

## 2012-07-27 MED ORDER — FENTANYL CITRATE 0.05 MG/ML IJ SOLN: 25.0000 ug | INTRAMUSCULAR | Status: DC | PRN

## 2012-07-27 MED ORDER — IOHEXOL 300 MG/ML  SOLN
INTRAMUSCULAR | Status: DC | PRN
  Administered 2012-07-27: 10 mL

## 2012-07-27 MED ORDER — BUPIVACAINE HCL (PF) 0.25 % IJ SOLN
INTRAMUSCULAR | Status: DC | PRN
  Administered 2012-07-27: 30 mL

## 2012-07-27 MED ORDER — FENTANYL CITRATE 0.05 MG/ML IJ SOLN
INTRAMUSCULAR | Status: DC | PRN
  Administered 2012-07-27 (×3): 50 ug via INTRAVENOUS
  Administered 2012-07-27: 100 ug via INTRAVENOUS

## 2012-07-27 MED ORDER — BUPIVACAINE-EPINEPHRINE PF 0.25-1:200000 % IJ SOLN
INTRAMUSCULAR | Status: AC
  Filled 2012-07-27: qty 30

## 2012-07-27 MED ORDER — DEXAMETHASONE SODIUM PHOSPHATE 10 MG/ML IJ SOLN
INTRAMUSCULAR | Status: DC | PRN
  Administered 2012-07-27: 10 mg via INTRAVENOUS

## 2012-07-27 MED ORDER — LACTATED RINGERS IR SOLN
Status: DC | PRN
  Administered 2012-07-27: 1000 mL

## 2012-07-27 MED ORDER — ONDANSETRON HCL 4 MG/2ML IJ SOLN
INTRAMUSCULAR | Status: DC | PRN
  Administered 2012-07-27: 4 mg via INTRAVENOUS

## 2012-07-27 MED ORDER — ONDANSETRON HCL 4 MG/2ML IJ SOLN: 4.0000 mg | Freq: Once | INTRAMUSCULAR | Status: DC | PRN

## 2012-07-27 MED ORDER — LACTATED RINGERS IV SOLN
INTRAVENOUS | Status: DC
  Administered 2012-07-27: 14:00:00 via INTRAVENOUS
  Administered 2012-07-27 (×2): 1000 mL via INTRAVENOUS

## 2012-07-27 MED ORDER — MEPERIDINE HCL 50 MG/ML IJ SOLN: 6.2500 mg | INTRAMUSCULAR | Status: DC | PRN

## 2012-07-27 MED ORDER — IOHEXOL 300 MG/ML  SOLN
INTRAMUSCULAR | Status: AC
  Filled 2012-07-27: qty 1

## 2012-07-27 MED ORDER — FENTANYL CITRATE 0.05 MG/ML IJ SOLN
INTRAMUSCULAR | Status: AC
  Administered 2012-07-27: 50 ug via INTRAVENOUS
  Filled 2012-07-27: qty 2

## 2012-07-27 MED ORDER — LIDOCAINE-EPINEPHRINE (PF) 1 %-1:200000 IJ SOLN
INTRAMUSCULAR | Status: DC | PRN
  Administered 2012-07-27: 30 mL

## 2012-07-27 MED ORDER — CISATRACURIUM BESYLATE (PF) 10 MG/5ML IV SOLN
INTRAVENOUS | Status: DC | PRN
  Administered 2012-07-27: 7 mg via INTRAVENOUS
  Administered 2012-07-27: 2 mg via INTRAVENOUS

## 2012-07-27 MED ORDER — PROMETHAZINE HCL 25 MG/ML IJ SOLN
INTRAMUSCULAR | Status: AC
  Filled 2012-07-27: qty 1

## 2012-07-27 MED ORDER — MIDAZOLAM HCL 5 MG/5ML IJ SOLN
INTRAMUSCULAR | Status: DC | PRN
  Administered 2012-07-27: 2 mg via INTRAVENOUS

## 2012-07-27 MED ORDER — OXYCODONE-ACETAMINOPHEN 5-325 MG PO TABS: 1.0000 | ORAL_TABLET | ORAL | Status: DC | PRN

## 2012-07-27 MED ORDER — BUPIVACAINE HCL (PF) 0.25 % IJ SOLN
INTRAMUSCULAR | Status: AC
  Filled 2012-07-27: qty 30

## 2012-07-27 MED ORDER — CIPROFLOXACIN IN D5W 400 MG/200ML IV SOLN
INTRAVENOUS | Status: AC
  Filled 2012-07-27: qty 200

## 2012-07-27 MED ORDER — CIPROFLOXACIN IN D5W 400 MG/200ML IV SOLN
400.0000 mg | Freq: Once | INTRAVENOUS | Status: AC
  Administered 2012-07-27: 400 mg via INTRAVENOUS

## 2012-07-27 MED ORDER — ACETAMINOPHEN 10 MG/ML IV SOLN
INTRAVENOUS | Status: DC | PRN
  Administered 2012-07-27: 1000 mg via INTRAVENOUS

## 2012-07-27 MED ORDER — PROPOFOL 10 MG/ML IV BOLUS
INTRAVENOUS | Status: DC | PRN
  Administered 2012-07-27: 170 mg via INTRAVENOUS

## 2012-07-27 MED ORDER — LIDOCAINE-EPINEPHRINE (PF) 1 %-1:200000 IJ SOLN
INTRAMUSCULAR | Status: AC
  Filled 2012-07-27: qty 10

## 2012-07-27 MED ORDER — OXYCODONE HCL 5 MG PO TABS: 10.0000 mg | ORAL_TABLET | Freq: Once | ORAL | Status: DC | PRN

## 2012-07-27 MED ORDER — NEOSTIGMINE METHYLSULFATE 1 MG/ML IJ SOLN
INTRAMUSCULAR | Status: DC | PRN
  Administered 2012-07-27: 4 mg via INTRAVENOUS

## 2012-07-27 MED ORDER — LACTATED RINGERS IV SOLN: INTRAVENOUS | Status: DC

## 2012-07-27 MED ORDER — SODIUM CHLORIDE 0.9 % IR SOLN
Status: DC | PRN
  Administered 2012-07-27: 1000 mL

## 2012-07-27 MED ORDER — OXYCODONE HCL 5 MG PO TABS
ORAL_TABLET | ORAL | Status: AC
  Administered 2012-07-27: 5 mg
  Filled 2012-07-27: qty 1

## 2012-07-27 MED ORDER — ACETAMINOPHEN 10 MG/ML IV SOLN
INTRAVENOUS | Status: AC
  Filled 2012-07-27: qty 100

## 2012-07-27 MED ORDER — GLYCOPYRROLATE 0.2 MG/ML IJ SOLN
INTRAMUSCULAR | Status: DC | PRN
  Administered 2012-07-27: 0.6 mg via INTRAVENOUS

## 2012-07-27 SURGICAL SUPPLY — 38 items
APPLICATOR COTTON TIP 6IN STRL (MISCELLANEOUS) IMPLANT
APPLIER CLIP LOGIC TI 5 (MISCELLANEOUS) IMPLANT
APPLIER CLIP ROT 10 11.4 M/L (STAPLE) ×2
CABLE HIGH FREQUENCY MONO STRZ (ELECTRODE) ×2 IMPLANT
CANISTER SUCTION 2500CC (MISCELLANEOUS) ×2 IMPLANT
CHLORAPREP W/TINT 26ML (MISCELLANEOUS) ×2 IMPLANT
CLIP APPLIE ROT 10 11.4 M/L (STAPLE) ×1 IMPLANT
CLOTH BEACON ORANGE TIMEOUT ST (SAFETY) ×2 IMPLANT
COVER SURGICAL LIGHT HANDLE (MISCELLANEOUS) ×2 IMPLANT
DECANTER SPIKE VIAL GLASS SM (MISCELLANEOUS) ×2 IMPLANT
DERMABOND ADVANCED (GAUZE/BANDAGES/DRESSINGS)
DERMABOND ADVANCED .7 DNX12 (GAUZE/BANDAGES/DRESSINGS) IMPLANT
DRAPE C-ARM 42X72 X-RAY (DRAPES) ×2 IMPLANT
DRAPE LAPAROSCOPIC ABDOMINAL (DRAPES) ×2 IMPLANT
ELECT REM PT RETURN 9FT ADLT (ELECTROSURGICAL) ×2
ELECTRODE REM PT RTRN 9FT ADLT (ELECTROSURGICAL) ×1 IMPLANT
ENDOLOOP SUT PDS II  0 18 (SUTURE)
ENDOLOOP SUT PDS II 0 18 (SUTURE) IMPLANT
GLOVE SURG SS PI 7.5 STRL IVOR (GLOVE) ×4 IMPLANT
GOWN STRL NON-REIN LRG LVL3 (GOWN DISPOSABLE) ×2 IMPLANT
GOWN STRL REIN XL XLG (GOWN DISPOSABLE) ×4 IMPLANT
KIT BASIN OR (CUSTOM PROCEDURE TRAY) ×2 IMPLANT
NS IRRIG 1000ML POUR BTL (IV SOLUTION) ×2 IMPLANT
PENCIL BUTTON HOLSTER BLD 10FT (ELECTRODE) ×2 IMPLANT
POUCH SPECIMEN RETRIEVAL 10MM (ENDOMECHANICALS) ×2 IMPLANT
SCISSORS LAP 5X35 DISP (ENDOMECHANICALS) IMPLANT
SET CHOLANGIOGRAPH MIX (MISCELLANEOUS) ×2 IMPLANT
SET IRRIG TUBING LAPAROSCOPIC (IRRIGATION / IRRIGATOR) IMPLANT
SOLUTION ANTI FOG 6CC (MISCELLANEOUS) ×2 IMPLANT
STRIP CLOSURE SKIN 1/2X4 (GAUZE/BANDAGES/DRESSINGS) IMPLANT
SUT MNCRL AB 4-0 PS2 18 (SUTURE) ×2 IMPLANT
SUT VICRYL 0 UR6 27IN ABS (SUTURE) ×4 IMPLANT
TOWEL OR 17X26 10 PK STRL BLUE (TOWEL DISPOSABLE) ×2 IMPLANT
TRAY LAP CHOLE (CUSTOM PROCEDURE TRAY) ×2 IMPLANT
TROCAR BALLN 12MMX100 BLUNT (TROCAR) ×2 IMPLANT
TROCAR BLADELESS OPT 5 75 (ENDOMECHANICALS) ×4 IMPLANT
TROCAR XCEL NON-BLD 11X100MML (ENDOMECHANICALS) ×2 IMPLANT
TUBING INSUFFLATION 10FT LAP (TUBING) ×2 IMPLANT

## 2012-07-27 NOTE — H&P (View-Only) (Signed)
Patient ID: Emma Chambers, female   DOB: 04/15/1990, 22 y.o.   MRN: [id number]  Chief Complaint  Patient presents with  . Re-evaluation    re-eval chole - discuss Sx    HPI Emma Chambers is a 22 y.o. female.  This patient is known to me forprior evaluation for symptomatic cholelithiasis. She has had a long-standing symptoms and her situation has been complicated by an intrauterine pregnancy however she delivered her baby about 2 weeks ago. She comes in today to discuss surgery which is already scheduled. She says that since the birth of her child she has been feeling better and hasn't had as frequent attacks.  Please refer to her prior clinic note for additional details HPI  Past Medical History  Diagnosis Date  . UTI (lower urinary tract infection)   . Hypertension     GHTN with last pg  . Pregnancy induced hypertension   . Headache   . Gallstones     Past Surgical History  Procedure Laterality Date  . No past surgeries      Family History  Problem Relation Age of Onset  . Asthma Mother   . Hypertension Mother   . Parkinsonism Mother   . Diabetes Maternal Grandmother   . Hypertension Maternal Grandmother   . Stroke Maternal Grandmother   . Anesthesia problems Neg Hx   . Malignant hyperthermia Neg Hx   . Pseudochol deficiency Neg Hx   . Hypotension Neg Hx   . Asthma Brother     Social History History  Substance Use Topics  . Smoking status: Never Smoker   . Smokeless tobacco: Never Used  . Alcohol Use: No    Allergies  Allergen Reactions  . Ambien (Zolpidem Tartrate) Other (See Comments)    Caused her to have hallucinations  . Amoxicillin Nausea And Vomiting and Other (See Comments)    Tics & twitches  . Dilaudid (Hydromorphone Hcl) Itching    Current Outpatient Prescriptions  Medication Sig Dispense Refill  . oxycodone-acetaminophen (ROXICET) 5-500 MG per tablet Take 1 tablet by mouth every 4 (four) hours as needed for pain.       No current  facility-administered medications for this visit.    Review of Systems Review of Systems All other review of systems negative or noncontributory except as stated in the HPI  Blood pressure 120/76, pulse 68, temperature 98.6 F (37 C), temperature source Oral, resp. rate 16, height 5' 1" (1.549 m), weight 207 lb 12.8 oz (94.257 kg), not currently breastfeeding.  Physical Exam Physical Exam Physical Exam  Nursing note and vitals reviewed. Constitutional: She is oriented to person, place, and time. She appears well-developed and well-nourished. No distress.  HENT:  Head: Normocephalic and atraumatic.  Mouth/Throat: No oropharyngeal exudate.  Eyes: Conjunctivae and EOM are normal. Pupils are equal, round, and reactive to light. Right eye exhibits no discharge. Left eye exhibits no discharge. No scleral icterus.  Neck: Normal range of motion. Neck supple. No tracheal deviation present.  Cardiovascular: Normal rate, regular rhythm, normal heart sounds and intact distal pulses.   Pulmonary/Chest: Effort normal and breath sounds normal. No stridor. No respiratory distress. She has no wheezes.  Abdominal: Soft. Bowel sounds are normal. She exhibits no distension and no mass. There is no tenderness. There is no rebound and no guarding.  Musculoskeletal: Normal range of motion. She exhibits no edema and no tenderness.  Neurological: She is alert and oriented to person, place, and time.  Skin: Skin is warm and dry.   No rash noted. She is not diaphoretic. No erythema. No pallor.  Psychiatric: She has a normal mood and affect. Her behavior is normal. Judgment and thought content normal.    Data Reviewed Labs, US  Assessment    Symptomatic cholelithiasis She does appear to have symptomatic cholelithiasis and I have offered her an already scheduled her for a laparoscopic cholecystectomy with possible clinic and a possible open surgery. She does have some elevated alkaline phosphatase and I wouldn't  attempt a cholangiogram at the time. She is also scheduled for tubal ligation by her gynecologist but she says that she is currently having second thoughts about this. We did discuss the surgery and its risks .The risks of infection, bleeding, pain, persistent symptoms, scarring, injury to bowel or bile ducts, retained stone, diarrhea, need for additional procedures, and need for open surgery discussed with the patient. She expressed understanding and would like to proceed with laparoscopic cholecystectomy     Plan    We will set her up for laparoscopic cholecystectomy and possible tubal ligation        Wayden Schwertner DAVID 07/17/2012, 9:06 AM    

## 2012-07-27 NOTE — Interval H&P Note (Signed)
History and Physical Interval Note:  07/27/2012 12:15 PM  Leavy Cella CHAD TIZNADO  has presented today for surgery, with the diagnosis of cholelithiasis  The various methods of treatment have been discussed with the patient and family. After consideration of risks, benefits and other options for treatment, the patient has consented to  Procedure(s): LAPAROSCOPIC CHOLECYSTECTOMY WITH INTRAOPERATIVE CHOLANGIOGRAM (N/A) as a surgical intervention .  The patient's history has been reviewed, patient examined, no change in status, stable for surgery.  I have reviewed the patient's chart and labs.  Questions were answered to the patient's satisfaction.  I have seen and evaluated the patient.  Risks of the procedure again discussed in lay terms.  The risks of infection, bleeding, pain, persistent symptoms, scarring, injury to bowel or bile ducts, retained stone, diarrhea, need for additional procedures, and need for open surgery discussed with the patient.  We will plan on doing a cholangiogram if possible as she does have elevated AP.  She did have an episode of pain last night and this am.      Lodema Pilot DAVID

## 2012-07-27 NOTE — Anesthesia Preprocedure Evaluation (Signed)
Anesthesia Evaluation  Patient identified by MRN, date of birth, ID band Patient awake    Reviewed: Allergy & Precautions, H&P , NPO status , Patient's Chart, lab work & pertinent test results  Airway Mallampati: II TM Distance: >3 FB Neck ROM: Full    Dental no notable dental hx.    Pulmonary neg pulmonary ROS,  breath sounds clear to auscultation  Pulmonary exam normal       Cardiovascular negative cardio ROS  Rhythm:Regular Rate:Normal     Neuro/Psych negative neurological ROS  negative psych ROS   GI/Hepatic negative GI ROS, Neg liver ROS,   Endo/Other  Morbid obesity  Renal/GU negative Renal ROS  negative genitourinary   Musculoskeletal negative musculoskeletal ROS (+)   Abdominal   Peds negative pediatric ROS (+)  Hematology negative hematology ROS (+)   Anesthesia Other Findings   Reproductive/Obstetrics negative OB ROS                           Anesthesia Physical Anesthesia Plan  ASA: II  Anesthesia Plan: General   Post-op Pain Management:    Induction: Intravenous  Airway Management Planned:   Additional Equipment:   Intra-op Plan:   Post-operative Plan: Extubation in OR  Informed Consent: I have reviewed the patients History and Physical, chart, labs and discussed the procedure including the risks, benefits and alternatives for the proposed anesthesia with the patient or authorized representative who has indicated his/her understanding and acceptance.   Dental advisory given  Plan Discussed with: CRNA  Anesthesia Plan Comments:         Anesthesia Quick Evaluation

## 2012-07-27 NOTE — Brief Op Note (Signed)
07/27/2012  2:20 PM  PATIENT:  Emma Chambers  22 y.o. female  PRE-OPERATIVE DIAGNOSIS:  cholelithiasis  POST-OPERATIVE DIAGNOSIS:  cholelithiasis  PROCEDURE:  Procedure(s): LAPAROSCOPIC CHOLECYSTECTOMY WITH INTRAOPERATIVE CHOLANGIOGRAM (N/A)  SURGEON:  Surgeon(s) and Role:    * Lodema Pilot, DO - Primary    * Adolph Pollack, MD - Assisting  PHYSICIAN ASSISTANT:   ASSISTANTS: [place]   ANESTHESIA:   general  EBL:  Total I/O In: 1000 [I.V.:1000] Out: 200 [Urine:200]  BLOOD ADMINISTERED:none  DRAINS: none   LOCAL MEDICATIONS USED:  MARCAINE    and LIDOCAINE   SPECIMEN:  Source of Specimen:  gallbladder and contents  DISPOSITION OF SPECIMEN:  PATHOLOGY  COUNTS:  YES  TOURNIQUET:  * No tourniquets in log *  DICTATION: .Other Dictation: Dictation Number [id number]  PLAN OF CARE: Discharge to home after PACU  PATIENT DISPOSITION:  PACU - hemodynamically stable.   Delay start of Pharmacological VTE agent (>24hrs) due to surgical blood loss or risk of bleeding: no

## 2012-07-27 NOTE — Transfer of Care (Signed)
Immediate Anesthesia Transfer of Care Note  Patient: Emma Chambers  Procedure(s) Performed: Procedure(s): LAPAROSCOPIC CHOLECYSTECTOMY WITH INTRAOPERATIVE CHOLANGIOGRAM (N/A)  Patient Location: PACU  Anesthesia Type:General  Level of Consciousness: awake, sedated and patient cooperative  Airway & Oxygen Therapy: Patient Spontanous Breathing and Patient connected to face mask oxygen  Post-op Assessment: Report given to PACU RN and Post -op Vital signs reviewed and stable  Post vital signs: Reviewed and stable  Complications: No apparent anesthesia complications

## 2012-07-28 ENCOUNTER — Telehealth (INDEPENDENT_AMBULATORY_CARE_PROVIDER_SITE_OTHER): Payer: Self-pay | Admitting: General Surgery

## 2012-07-28 ENCOUNTER — Encounter (HOSPITAL_COMMUNITY): Payer: Self-pay | Admitting: General Surgery

## 2012-07-28 NOTE — Op Note (Signed)
NAMEDAURICE, OVANDO NO.:  [id number]  MEDICAL RECORD NO.:  [id number]  LOCATION:  [facility]                         FACILITY:  [facility]  PHYSICIAN:  Lodema Pilot, MD       DATE OF BIRTH:  [birth year]  DATE OF PROCEDURE:  07/27/2012 DATE OF DISCHARGE:  07/27/2012                              OPERATIVE REPORT   PROCEDURE:  Laparoscopic cholecystectomy with intraoperative cholangiogram.  PREOPERATIVE DIAGNOSIS:  Symptomatic cholelithiasis.  POSTOPERATIVE DIAGNOSIS:  Symptomatic cholelithiasis.  SURGEON:  Lodema Pilot, MD  ASSISTANT:  Adolph Pollack, M.D.  FLUIDS:  1500 mL of crystalloid.  ESTIMATED BLOOD LOSS:  Minimal.  DRAINS:  None.  SPECIMENS:  Gallbladder and contents sent to Pathology for permanent sectioning.  FINDINGS:  Three stones in the cystic duct retrieved, otherwise no filling defects seen on cholangiogram.  No obvious inflammatory changes.  COMPLICATIONS:  None apparent.  INDICATION FOR PROCEDURE:  Ms. Tiegs is a 22 year old female was seen almost 1 month postpartum.  During her pregnancy, she had a right upper quadrant pain after eating, several episodes of pain which required administration of IV pain medication, and several trips to the emergency room.  She had gallstones on ultrasound, as well as elevated alkaline phosphatase.  OPERATIVE DETAILS:  Ms. Huettner was seen and evaluated in the preoperative area and risks and benefits of the procedure were again discussed in lay terms and informed consent was obtained.  She was given prophylactic antibiotics and taken to the operating room, placed in the supine position.  General endotracheal anesthesia was obtained and Foley catheter was placed.  Her abdomen was prepped and draped in a standard surgical fashion and procedure time-out was performed with all operative team members to confirm proper patient and procedure.  An supraumbilical midline incision was made in the skin and  dissection carried down to the subcutaneous tissue using blunt dissection.  The abdominal wall fascia was elevated and sharply incised and the peritoneum entered bluntly. The 12 mm balloon port was placed at the umbilicus and pneumoperitoneum was obtained.  Laparoscope was introduced and there was no evidence of bowel injury upon entry.  Then, an 11 mm epigastric port was placed and two 5 mm right upper quadrant ports were placed under direct visualization.  The gallbladder was retracted cephalad.  She had normal- appearing anatomy and the peritoneum was taken down using blunt dissection.  The cystic artery was identified in its usual anatomic position on the medial aspect of the gallbladder.  The artery was skeletonized and clips were placed on the artery, 2 on the stay side and 1 on the distal side.  The artery was not divided at this time.  I then skeletonized the cystic duct and her common duct was actually visible as well.  We skeletonized the cystic duct and you could tell that she had some stones in the cystic duct.  I tried to milk these back into the gallbladder and then placed a clip on the gallbladder side of the cystic duct.  The critical view of safety was obtained visualizing a single cystic duct entering the gallbladder and liver parenchyma through the triangle of Calot and a single cystic artery through  the triangle of Calot.  Cholangiogram catheter was passed through the abdominal wall while a cystic ductotomy was made and milked back through the cystic ductotomy two small gallstones and these were retrieved from the abdomen.  I tried to pass the cholangiogram catheter and there was difficulty passing this; we were having some leakage around the clip, so I removed the clip and actually I was able to milk back another stone and a cholangiogram catheter was placed back in the cystic duct and the cholangiogram was clipped into place and cholangiogram was performed. There was  free flow of bile into the duodenum and this cleared the cystic duct.  There was no evidence of any filling defects in the distal common bile duct but the proximal duct briefly filled.  We tried to place the patient in Trendelenburg position and repeated the cholangiogram, but we had leakage so I replaced a cholangiogram catheter and clipped into place again.  I performed cholangiogram in Trendelenburg position.  At this time, I was able to see some better filling of the right and left hepatic ducts.  I did not see any filling defects in the common bile duct.  Cholangiogram catheter was removed and 2 clips were placed on the cystic duct stump and the previous clipped artery was divided and the gallbladder was removed from the gallbladder fossa using Bovie electrocautery.  The gallbladder was not entered during the dissection.  There was no spillage of bile or any other spillage of any gallstones, other than the 3 stones that we milked out of the cystic duct.  The gallbladder was completely removed from the gallbladder fossa and the gallbladder fossa was noted to be hemostatic. It was placed in an EndoCatch bag and removed from the umbilical trocar site.  She did have a few small stones palpable in the gallbladder and the gallbladder was sent to Pathology for permanent section.  The abdomen was again inspected for hemostasis which was noted to be adequate.  There was no evidence of bleeding or bowel injury.  The right upper quadrant trocar was removed under direct visualization and the abdominal wall was noted to be hemostatic.  The right upper quadrant was suctioned of any fluid and the umbilical fascia was approximated with interrupted 0 Vicryl sutures in an open fashion.  The abdomen was reinsufflated through the epigastric trocar and the abdominal closure was noted to be adequate without any evidence of bleeding or bowel injury.  The final trocar was removed.  The wounds were injected  with total of 20 mL of 1% lidocaine with epinephrine and 0.25% Marcaine in a 50:50 mixture and the skin edges were approximated with 4-0 Monocryl subcuticular suture.  Skin was washed and dried and Dermabond was applied.  Foley catheter was removed at the end of the case, and the patient tolerated the procedure well without apparent complication.          ______________________________ Lodema Pilot, MD     BL/MEDQ  D:  07/27/2012  T:  07/28/2012  Job:  [id number]

## 2012-07-28 NOTE — Anesthesia Postprocedure Evaluation (Signed)
  Anesthesia Post-op Note  Patient: Emma Chambers  Procedure(s) Performed: Procedure(s) (LRB): LAPAROSCOPIC CHOLECYSTECTOMY WITH INTRAOPERATIVE CHOLANGIOGRAM (N/A)  Patient Location: PACU  Anesthesia Type: General  Level of Consciousness: awake and alert   Airway and Oxygen Therapy: Patient Spontanous Breathing  Post-op Pain: mild  Post-op Assessment: Post-op Vital signs reviewed, Patient's Cardiovascular Status Stable, Respiratory Function Stable, Patent Airway and No signs of Nausea or vomiting  Last Vitals:  Filed Vitals:   07/27/12 1741  BP: 136/68  Pulse: 83  Temp: 37.1 C  Resp: 18    Post-op Vital Signs: stable   Complications: No apparent anesthesia complications

## 2012-07-28 NOTE — Telephone Encounter (Signed)
Spoke with patient she is aware of appt on 08/13/12

## 2012-07-30 ENCOUNTER — Telehealth (INDEPENDENT_AMBULATORY_CARE_PROVIDER_SITE_OTHER): Payer: Self-pay

## 2012-07-30 NOTE — Telephone Encounter (Signed)
Patient called in stating she is having itching and a little burning at incision site. No fevers, chills, redness or drainage from area. I told her it sounds like she may be reacting to the sutures or glue that was put in at surgery. I advised she could try some benadryl for itching. She will call back for worsening symptoms.

## 2012-08-02 ENCOUNTER — Encounter (HOSPITAL_COMMUNITY): Payer: Self-pay | Admitting: *Deleted

## 2012-08-02 ENCOUNTER — Inpatient Hospital Stay (HOSPITAL_COMMUNITY)
Admission: AD | Admit: 2012-08-02 | Discharge: 2012-08-02 | Disposition: A | Discharge status: Home / Self Care | Payer: Medicaid Other | Source: Ambulatory Visit | Attending: Obstetrics & Gynecology | Admitting: Obstetrics & Gynecology

## 2012-08-02 DIAGNOSIS — Z9089 Acquired absence of other organs: Secondary | ICD-10-CM | POA: Insufficient documentation

## 2012-08-02 LAB — CBC WITH DIFFERENTIAL/PLATELET
Basophils Absolute: 0 10*3/uL (ref 0.0–0.1)
Basophils Relative: 0 % (ref 0–1)
Eosinophils Absolute: 0.4 10*3/uL (ref 0.0–0.7)
Eosinophils Relative: 5 % (ref 0–5)
HCT: 29.8 % — ABNORMAL LOW (ref 36.0–46.0)
Hemoglobin: 9.3 g/dL — ABNORMAL LOW (ref 12.0–15.0)
Lymphocytes Relative: 28 % (ref 12–46)
Lymphs Abs: 2.5 10*3/uL (ref 0.7–4.0)
MCH: 23 pg — ABNORMAL LOW (ref 26.0–34.0)
MCHC: 31.2 g/dL (ref 30.0–36.0)
MCV: 73.8 fL — ABNORMAL LOW (ref 78.0–100.0)
Monocytes Absolute: 0.5 10*3/uL (ref 0.1–1.0)
Monocytes Relative: 5 % (ref 3–12)
Neutro Abs: 5.6 10*3/uL (ref 1.7–7.7)
Neutrophils Relative %: 63 % (ref 43–77)
Platelets: 357 10*3/uL (ref 150–400)
RBC: 4.04 MIL/uL (ref 3.87–5.11)
RDW: 17.1 % — ABNORMAL HIGH (ref 11.5–15.5)
WBC: 8.9 10*3/uL (ref 4.0–10.5)

## 2012-08-02 MED ORDER — MEGESTROL ACETATE 40 MG PO TABS: 40.0000 mg | ORAL_TABLET | Freq: Every day | ORAL | Status: DC

## 2012-08-02 NOTE — MAU Provider Note (Signed)
History     CSN: [id number]  Arrival date and time: 08/02/12 0854   None     Chief Complaint  Patient presents with  . Vaginal Bleeding   HPI This is a 22 y.o. female who is about 5 weeks postpartum who presents with c/o persistent heavy postpartum bleeding. Had a cholecystectomy in April   States never stopped bleeding and is worried because clots are still big. States is soaking a pad every hour.  RN Note: Pt reports she has been having heavy vaginal bleeding since she delivered 4/14. She stated she is soaking 1 pad and hour and passed a large clot tha t had an odor. Has not had f/u appointment yet with clinic downstairs      OB History   Grav Para Term Preterm Abortions TAB SAB Ect Mult Living   3 3 1 1  0 0 0 0 0 3      Past Medical History  Diagnosis Date  . UTI (lower urinary tract infection)   . Hypertension     GHTN with last pg  . Pregnancy induced hypertension   . Headache   . Gallstones     Past Surgical History  Procedure Laterality Date  . Cholecystectomy N/A 07/27/2012    Procedure: LAPAROSCOPIC CHOLECYSTECTOMY WITH INTRAOPERATIVE CHOLANGIOGRAM;  Surgeon: Lodema Pilot, DO;  Location: [facility];  Service: General;  Laterality: N/A;    Family History  Problem Relation Age of Onset  . Asthma Mother   . Hypertension Mother   . Parkinsonism Mother   . Diabetes Maternal Grandmother   . Hypertension Maternal Grandmother   . Stroke Maternal Grandmother   . Anesthesia problems Neg Hx   . Malignant hyperthermia Neg Hx   . Pseudochol deficiency Neg Hx   . Hypotension Neg Hx   . Asthma Brother     History  Substance Use Topics  . Smoking status: Never Smoker   . Smokeless tobacco: Never Used  . Alcohol Use: No    Allergies:  Allergies  Allergen Reactions  . Ambien (Zolpidem Tartrate) Other (See Comments)    Caused her to have hallucinations  . Amoxicillin Nausea And Vomiting and Other (See Comments)    Tics & twitches  . Dilaudid (Hydromorphone  Hcl) Itching    Prescriptions prior to admission  Medication Sig Dispense Refill  . acetaminophen (TYLENOL) 500 MG tablet Take 1,000 mg by mouth every 6 (six) hours as needed for pain.      Marland Kitchen oxyCODONE-acetaminophen (PERCOCET/ROXICET) 5-325 MG per tablet Take 1 tablet by mouth every 4 (four) hours as needed for pain.      . [DISCONTINUED] oxyCODONE-acetaminophen (ROXICET) 5-325 MG per tablet Take 1 tablet by mouth every 4 (four) hours as needed for pain.  40 tablet  0    Review of Systems  Constitutional: Negative for fever, chills and malaise/fatigue.  Gastrointestinal: Positive for abdominal pain (lower cramping). Negative for nausea, vomiting, diarrhea and constipation.  Genitourinary: Negative for dysuria.  Neurological: Negative for dizziness and headaches.   Physical Exam   Blood pressure 129/68, pulse 103, temperature 98.5 F (36.9 C), temperature source Oral, resp. rate 18, height 5' (1.524 m), weight 212 lb 3.2 oz (96.253 kg).  Physical Exam  Constitutional: She is oriented to person, place, and time. She appears well-developed and well-nourished. No distress.  HENT:  Head: Normocephalic.  Cardiovascular: Normal rate.   Respiratory: Effort normal.  GI: Soft. She exhibits no distension and no mass. There is tenderness (pelvic, mild). There  is no rebound and no guarding.  Genitourinary: Uterus normal. Vaginal discharge (moderate dark red blood) found.  Uterus involuted, slightly tender   Musculoskeletal: Normal range of motion.  Neurological: She is alert and oriented to person, place, and time.  Skin: Skin is warm and dry.  Psychiatric: She has a normal mood and affect.    MAU Course  Procedures  Assessment and Plan  A:  Postpartum x 5 weeks      S/P cholecystectomy       Postpartum bleeding  P:  Discussed with Dr Despina Hidden       Will Rx Megace for a month       Followup in clinic  [facility] 08/02/2012, 10:44 AM

## 2012-08-02 NOTE — MAU Note (Signed)
Pt reports she has been having heavy vaginal bleeding since she delivered 4/14. She stated she is soaking 1 pad and hour and passed a large clot tha t had an odor. Has not had f/u appointment yet with clinic downstairs

## 2012-08-06 ENCOUNTER — Ambulatory Visit: Payer: Medicaid Other | Admitting: Obstetrics and Gynecology

## 2012-08-13 ENCOUNTER — Encounter (INDEPENDENT_AMBULATORY_CARE_PROVIDER_SITE_OTHER): Payer: Medicaid Other | Admitting: General Surgery

## 2012-08-19 ENCOUNTER — Other Ambulatory Visit: Payer: Self-pay | Admitting: Obstetrics & Gynecology

## 2012-08-19 ENCOUNTER — Ambulatory Visit (INDEPENDENT_AMBULATORY_CARE_PROVIDER_SITE_OTHER): Payer: Medicaid Other | Admitting: Obstetrics & Gynecology

## 2012-08-19 ENCOUNTER — Encounter: Payer: Self-pay | Admitting: Obstetrics & Gynecology

## 2012-08-19 VITALS — BP 113/65 | HR 93 | Resp 20 | Ht 61.0 in | Wt 215.0 lb

## 2012-08-19 DIAGNOSIS — Z30430 Encounter for insertion of intrauterine contraceptive device: Secondary | ICD-10-CM

## 2012-08-19 DIAGNOSIS — IMO0001 Reserved for inherently not codable concepts without codable children: Secondary | ICD-10-CM

## 2012-08-19 DIAGNOSIS — Z01812 Encounter for preprocedural laboratory examination: Secondary | ICD-10-CM

## 2012-08-19 LAB — POCT PREGNANCY, URINE: Preg Test, Ur: NEGATIVE

## 2012-08-19 MED ORDER — LEVONORGESTREL 20 MCG/24HR IU IUD
INTRAUTERINE_SYSTEM | Freq: Once | INTRAUTERINE | Status: AC
  Administered 2012-08-19: 1 via INTRAUTERINE

## 2012-08-19 NOTE — Progress Notes (Signed)
Patient ID: Emma Chambers, female   DOB: May 28, 1990, 22 y.o.   MRN: [id number] Subjective:     Emma Chambers is a 22 y.o. female who presents for a postpartum visit. She is 5 week postpartum following a spontaneous vaginal delivery. I have fully reviewed the prenatal and intrapartum course. The delivery was at 36 gestational weeks. Outcome: spontaneous vaginal delivery. Anesthesia: epidural. Postpartum course has been uncomplicated. Baby's course has been uncomplicated. Baby is feeding by breast. Bleeding no bleeding. Bowel function is normal. Bladder function is normal. Patient is not sexually active. Contraception method is none. Postpartum depression screening: positive. Does not want to talk with social worker today.  Note left for SW to call pt The following portions of the patient's history were reviewed and updated as appropriate: allergies, current medications, past family history, past medical history, past social history, past surgical history and problem list.  Review of Systems Pertinent items are noted in HPI.   Objective:    BP 113/65  Pulse 93  Resp 20  Ht 5\' 1"  (1.549 m)  Wt 215 lb (97.523 kg)  BMI 40.64 kg/m2  Breastfeeding? No  General:  alert and no distress     Lungs: clear to auscultation bilaterally  Heart:  regular rate and rhythm, S1, S2 normal, no murmur, click, rub or gallop  Abdomen: soft, non-tender; bowel sounds normal; no masses,  no organomegaly   Vulva:  normal  Vagina: normal vagina  Cervix:  no cervical motion tenderness  Corpus: normal size, contour, position, consistency, mobility, non-tender  Adnexa:  normal adnexa  Rectal Exam: Not performed.       Patient identified, informed consent performed.  Discussed risks of irregular bleeding, cramping, infection, malpositioning or misplacement of the IUD outside the uterus which may require further procedures. Time out was performed.  Urine pregnancy test negative.  Speculum placed in the vagina.   Cervix visualized.  Cleaned with Betadine x 2. Hurricaine spray applied to ant lip of cervix.  Cervix grasped anteriorly with a single tooth tenaculum.  Uterus sounded to 7 cm.  Mirena IUD placed per manufacturer's recommendations.  Strings trimmed to 3 cm. Tenaculum was removed, good hemostasis noted.  Patient tolerated procedure well.    Assessment:     5 week postpartum exam. Pap smear not done at today's visit.   Plan:    1. Contraception: IUD placed today 2. Patient was given post-procedure instructions and the Mirena care card with expiration date.  Patient was also asked to check IUD strings periodically and follow up in 4-6 weeks for IUD check.   3. Follow up in: 4 weeks or as needed.

## 2012-08-19 NOTE — Patient Instructions (Signed)

## 2012-08-19 NOTE — Progress Notes (Signed)
Patient ID: Emma Chambers, female   DOB: 1990-11-11, 22 y.o.   MRN: [id number] Pt desires Mirena IUD for birth control- would like insertion today.  Has active Medicaid until 10/15/12. Pt has been using condoms for birth control since birth of baby.  Last intercourse with condom 1 week ago. UPT- negative today.   Pt declines Social work referral today but would like a call from the Child psychotherapist for information regarding post partum depression.  Message left for Lulu Riding CSW to contact pt at home.

## 2012-08-21 NOTE — Progress Notes (Signed)
CSW received message from clinic requesting that CSW call patient to discuss possible PPD.  CSW called patient to discuss signs and symptoms of PPD and make recommendations on how to address PPD, but she states that now is not a good time to talk and that she will call CSW back at a later time.

## 2012-08-31 ENCOUNTER — Encounter (HOSPITAL_COMMUNITY): Payer: Self-pay | Admitting: Emergency Medicine

## 2012-08-31 ENCOUNTER — Emergency Department (HOSPITAL_COMMUNITY): Payer: Medicaid Other

## 2012-08-31 ENCOUNTER — Emergency Department (HOSPITAL_COMMUNITY)
Admission: EM | Admit: 2012-08-31 | Discharge: 2012-08-31 | Disposition: A | Discharge status: Home / Self Care | Payer: Medicaid Other | Attending: Emergency Medicine | Admitting: Emergency Medicine

## 2012-08-31 DIAGNOSIS — Z8719 Personal history of other diseases of the digestive system: Secondary | ICD-10-CM | POA: Insufficient documentation

## 2012-08-31 DIAGNOSIS — R0789 Other chest pain: Secondary | ICD-10-CM

## 2012-08-31 DIAGNOSIS — Z87440 Personal history of urinary (tract) infections: Secondary | ICD-10-CM | POA: Insufficient documentation

## 2012-08-31 DIAGNOSIS — R071 Chest pain on breathing: Secondary | ICD-10-CM | POA: Insufficient documentation

## 2012-08-31 DIAGNOSIS — R51 Headache: Secondary | ICD-10-CM | POA: Insufficient documentation

## 2012-08-31 DIAGNOSIS — Z8742 Personal history of other diseases of the female genital tract: Secondary | ICD-10-CM | POA: Insufficient documentation

## 2012-08-31 DIAGNOSIS — R519 Headache, unspecified: Secondary | ICD-10-CM

## 2012-08-31 DIAGNOSIS — Z880 Allergy status to penicillin: Secondary | ICD-10-CM | POA: Insufficient documentation

## 2012-08-31 DIAGNOSIS — R110 Nausea: Secondary | ICD-10-CM | POA: Insufficient documentation

## 2012-08-31 DIAGNOSIS — H53149 Visual discomfort, unspecified: Secondary | ICD-10-CM | POA: Insufficient documentation

## 2012-08-31 LAB — COMPREHENSIVE METABOLIC PANEL
ALT: 9 U/L (ref 0–35)
AST: 13 U/L (ref 0–37)
Albumin: 3.7 g/dL (ref 3.5–5.2)
Alkaline Phosphatase: 119 U/L — ABNORMAL HIGH (ref 39–117)
BUN: 11 mg/dL (ref 6–23)
CO2: 25 mEq/L (ref 19–32)
Calcium: 9.1 mg/dL (ref 8.4–10.5)
Chloride: 106 mEq/L (ref 96–112)
Creatinine, Ser: 0.63 mg/dL (ref 0.50–1.10)
GFR calc Af Amer: >90 mL/min (ref >90)
GFR calc non Af Amer: >90 mL/min (ref >90)
Glucose, Bld: 85 mg/dL (ref 70–99)
Potassium: 4.7 mEq/L (ref 3.5–5.1)
Sodium: 141 mEq/L (ref 135–145)
Total Bilirubin: 0.2 mg/dL — ABNORMAL LOW (ref 0.3–1.2)
Total Protein: 7.4 g/dL (ref 6.0–8.3)

## 2012-08-31 LAB — CBC WITH DIFFERENTIAL/PLATELET
Basophils Absolute: 0 10*3/uL (ref 0.0–0.1)
Basophils Relative: 0 % (ref 0–1)
Eosinophils Absolute: 0.5 10*3/uL (ref 0.0–0.7)
Eosinophils Relative: 5 % (ref 0–5)
HCT: 30.6 % — ABNORMAL LOW (ref 36.0–46.0)
Hemoglobin: 9.5 g/dL — ABNORMAL LOW (ref 12.0–15.0)
Lymphocytes Relative: 27 % (ref 12–46)
Lymphs Abs: 2.5 10*3/uL (ref 0.7–4.0)
MCH: 22.6 pg — ABNORMAL LOW (ref 26.0–34.0)
MCHC: 31 g/dL (ref 30.0–36.0)
MCV: 72.7 fL — ABNORMAL LOW (ref 78.0–100.0)
Monocytes Absolute: 0.6 10*3/uL (ref 0.1–1.0)
Monocytes Relative: 6 % (ref 3–12)
Neutro Abs: 5.7 10*3/uL (ref 1.7–7.7)
Neutrophils Relative %: 62 % (ref 43–77)
Platelets: 353 10*3/uL (ref 150–400)
RBC: 4.21 MIL/uL (ref 3.87–5.11)
RDW: 16.4 % — ABNORMAL HIGH (ref 11.5–15.5)
WBC: 9.2 10*3/uL (ref 4.0–10.5)

## 2012-08-31 MED ORDER — DEXAMETHASONE SODIUM PHOSPHATE 10 MG/ML IJ SOLN
10.0000 mg | Freq: Once | INTRAMUSCULAR | Status: AC
  Administered 2012-08-31: 10 mg via INTRAVENOUS
  Filled 2012-08-31: qty 1

## 2012-08-31 MED ORDER — METOCLOPRAMIDE HCL 5 MG/ML IJ SOLN
10.0000 mg | Freq: Once | INTRAMUSCULAR | Status: AC
  Administered 2012-08-31: 10 mg via INTRAVENOUS
  Filled 2012-08-31: qty 2

## 2012-08-31 MED ORDER — DIPHENHYDRAMINE HCL 50 MG/ML IJ SOLN
25.0000 mg | Freq: Once | INTRAMUSCULAR | Status: AC
  Administered 2012-08-31: 25 mg via INTRAVENOUS
  Filled 2012-08-31: qty 1

## 2012-08-31 MED ORDER — DIAZEPAM 5 MG PO TABS: 5.0000 mg | ORAL_TABLET | Freq: Four times a day (QID) | ORAL | Status: DC | PRN

## 2012-08-31 MED ORDER — SODIUM CHLORIDE 0.9 % IV BOLUS (SEPSIS)
1000.0000 mL | Freq: Once | INTRAVENOUS | Status: AC
  Administered 2012-08-31: 1000 mL via INTRAVENOUS

## 2012-08-31 NOTE — ED Provider Notes (Signed)
History     CSN: [id number]  Arrival date & time 08/31/12  0909   First MD Initiated Contact with Patient 08/31/12 [phone]      Chief Complaint  Patient presents with  . Chest Pain  . Headache    (Consider location/radiation/quality/duration/timing/severity/associated sxs/prior treatment) HPI  Emma Chambers is a 22 y.o. female complaining of bilateral frontal headache x3 weeks radiates around to bilateral occipital area, described as aching associated with photophobia and nausea.  Denies, fever, neck stiffness, rash, acute onset, syncope, dysarthria, ataxia, unilateral weakness, exacerbation with Valsalva or laying supine. Patient has taken Aleve with relief but the pain always returns. Patient has had headaches in the past and this is not different in character, location and severity however it is lasting longer than normal. Patient also reports a nonradiating, substernal chest tightness starting 48 hours ago she describes it as aching and burning and she has occasional shortness of breath associated with it she denies cough, recent travel/immobilization, mirena inserted 2 weeks ago, leg swelling, calf pain, h/o DVT or PE, family history of early cardiac or unexplained death.    Past Medical History  Diagnosis Date  . UTI (lower urinary tract infection)   . Headache(784.0)   . Gallstones   . Hypertension     GHTN with last pg  . Pregnancy induced hypertension     Past Surgical History  Procedure Laterality Date  . Cholecystectomy N/A 07/27/2012    Procedure: LAPAROSCOPIC CHOLECYSTECTOMY WITH INTRAOPERATIVE CHOLANGIOGRAM;  Surgeon: Lodema Pilot, DO;  Location: [facility];  Service: General;  Laterality: N/A;    Family History  Problem Relation Age of Onset  . Asthma Mother   . Hypertension Mother   . Parkinsonism Mother   . Diabetes Maternal Grandmother   . Hypertension Maternal Grandmother   . Stroke Maternal Grandmother   . Anesthesia problems Neg Hx   . Malignant  hyperthermia Neg Hx   . Pseudochol deficiency Neg Hx   . Hypotension Neg Hx   . Asthma Brother     History  Substance Use Topics  . Smoking status: Never Smoker   . Smokeless tobacco: Never Used  . Alcohol Use: No     Comment: social    OB History   Grav Para Term Preterm Abortions TAB SAB Ect Mult Living   2 2 1 1  0 0 0 0 0 2      Review of Systems  Constitutional:       Negative except as described in HPI  HENT:       Negative except as described in HPI  Respiratory:       Negative except as described in HPI  Cardiovascular:       Negative except as described in HPI  Gastrointestinal:       Negative except as described in HPI  Genitourinary:       Negative except as described in HPI  Musculoskeletal:       Negative except as described in HPI  Skin:       Negative except as described in HPI  Neurological:       Negative except as described in HPI  All other systems reviewed and are negative.    Allergies  Ambien; Amoxicillin; and Dilaudid  Home Medications  No current outpatient prescriptions on file.  BP 120/75  Pulse 97  Temp(Src) 98.8 F (37.1 C)  Resp 20  SpO2 100%  Breastfeeding? No  Physical Exam  Nursing note and vitals reviewed.  Constitutional: She is oriented to person, place, and time. She appears well-developed and well-nourished. No distress.  Well appearing, lights dimmed in room  HENT:  Head: Normocephalic.  Mouth/Throat: Oropharynx is clear and moist.  Eyes: Conjunctivae and EOM are normal. Pupils are equal, round, and reactive to light.  Neck: Normal range of motion.  Patient has full range of motion to neck. No tenderness to deep palpation of the posterior cervical spine. Patient can flex chin to chest with no pain.  Cardiovascular: Normal rate, normal heart sounds and intact distal pulses.   Pulmonary/Chest: Effort normal and breath sounds normal. No stridor. No respiratory distress. She has no wheezes. She has no rales. She  exhibits no tenderness.  CP non- reproducible  Abdominal: Soft. There is no tenderness. There is no rebound and no guarding.  Musculoskeletal: Normal range of motion. She exhibits no edema.  No calf asymmetry, superficial collaterals, palpable cords, edema, Homans sign negative bilaterally.    Neurological: She is alert and oriented to person, place, and time.  Cranial nerves III through XII intact, strength 5 out of 5x4 extremities, negative pronator drift, finger to nose and heel-to-shin coordinated, sensation intact to pinprick and light touch, gait is coordinated and Romberg is negative.    Skin: No rash noted.  Psychiatric: She has a normal mood and affect.    ED Course  Procedures (including critical care time)  Labs Reviewed  CBC WITH DIFFERENTIAL - Abnormal; Notable for the following:    Hemoglobin 9.5 (*)    HCT 30.6 (*)    MCV 72.7 (*)    MCH 22.6 (*)    RDW 16.4 (*)    All other components within normal limits  COMPREHENSIVE METABOLIC PANEL - Abnormal; Notable for the following:    Alkaline Phosphatase 119 (*)    Total Bilirubin 0.2 (*)    All other components within normal limits   Dg Chest 2 View  08/31/2012   *RADIOLOGY REPORT*  Clinical Data:  Chest pain.  CHEST - 2 VIEW  Comparison: None  Findings: The heart size and mediastinal contours are within normal limits.  Both lungs are clear.  The visualized skeletal structures are unremarkable.  IMPRESSION: No active disease.   Original Report Authenticated By: Irish Lack, M.D.    Date: 09/01/2012  Rate: 93  Rhythm: normal sinus rhythm  QRS Axis: normal  Intervals: normal  ST/T Wave abnormalities: normal  Conduction Disutrbances:none  Narrative Interpretation:   Old EKG Reviewed: none available    1. Head ache   2. Chest wall pain       MDM   Filed Vitals:   08/31/12 0916 08/31/12 1131 08/31/12 1211  BP: 120/75 98/69 100/62  Pulse: 97 93 83  Temp: 98.8 F (37.1 C)  98.7 F (37.1 C)  TempSrc:    Oral  Resp: 20 16 16   SpO2: 100% 100% 100%     Emma Chambers is a 22 y.o. female with 3 weeks of headache and 2 days of chest aching associated with shortness of breath. EKG is nonischemic, check for an x-ray shows no infiltrates, lung sounds are clear, patient is saturating well on room air. Doubt ACS, PE, pericarditis, myocarditis.   Headache has no right-sided likely tension based on distribution. He cocktail is mildly relieving. I will write her for muscle relaxants.  Medications  sodium chloride 0.9 % bolus 1,000 mL (0 mLs Intravenous Stopped 08/31/12 1211)  metoCLOPramide (REGLAN) injection 10 mg (10 mg Intravenous Given 08/31/12 1121)  dexamethasone (DECADRON) injection 10 mg (10 mg Intravenous Given 08/31/12 1125)  diphenhydrAMINE (BENADRYL) injection 25 mg (25 mg Intravenous Given 08/31/12 1122)    The patient is hemodynamically stable, appropriate for, and amenable to, discharge at this time. Pt verbalized understanding and agrees with care plan. Outpatient follow-up and return precautions given.    Discharge Medication List as of 08/31/2012 12:11 PM    START taking these medications   Details  diazepam (VALIUM) 5 MG tablet Take 1 tablet (5 mg total) by mouth every 6 (six) hours as needed (muscle spasms)., Starting 08/31/2012, Until Discontinued, [employer], PA-C 09/01/12 [phone]

## 2012-08-31 NOTE — ED Notes (Signed)
Pt reports 2 days of substernal chest tightness, aching and burning, states was SOB with episode. Pt states some relief with aleve at home. Pt also reports hx of migraines, currently c/o headache that began last night. Pt alert, oriented x4, NAD at present.

## 2012-09-01 NOTE — ED Provider Notes (Signed)
Medical screening examination/treatment/procedure(s) were performed by non-physician practitioner and as supervising physician I was immediately available for consultation/collaboration.   Carleene Cooper III, MD 09/01/12 [phone]

## 2012-09-21 ENCOUNTER — Encounter: Payer: Self-pay | Admitting: *Deleted

## 2012-11-27 ENCOUNTER — Emergency Department (HOSPITAL_COMMUNITY): Payer: Medicaid Other

## 2012-11-27 ENCOUNTER — Emergency Department (HOSPITAL_COMMUNITY)
Admission: EM | Admit: 2012-11-27 | Discharge: 2012-11-27 | Disposition: A | Discharge status: Home / Self Care | Payer: Medicaid Other | Attending: Emergency Medicine | Admitting: Emergency Medicine

## 2012-11-27 ENCOUNTER — Encounter (HOSPITAL_COMMUNITY): Payer: Self-pay | Admitting: Cardiology

## 2012-11-27 DIAGNOSIS — Z880 Allergy status to penicillin: Secondary | ICD-10-CM | POA: Insufficient documentation

## 2012-11-27 DIAGNOSIS — I10 Essential (primary) hypertension: Secondary | ICD-10-CM | POA: Insufficient documentation

## 2012-11-27 DIAGNOSIS — Z8719 Personal history of other diseases of the digestive system: Secondary | ICD-10-CM | POA: Insufficient documentation

## 2012-11-27 DIAGNOSIS — M25561 Pain in right knee: Secondary | ICD-10-CM

## 2012-11-27 DIAGNOSIS — Y9389 Activity, other specified: Secondary | ICD-10-CM | POA: Insufficient documentation

## 2012-11-27 DIAGNOSIS — S8990XA Unspecified injury of unspecified lower leg, initial encounter: Secondary | ICD-10-CM | POA: Insufficient documentation

## 2012-11-27 DIAGNOSIS — W108XXA Fall (on) (from) other stairs and steps, initial encounter: Secondary | ICD-10-CM | POA: Insufficient documentation

## 2012-11-27 DIAGNOSIS — Y9289 Other specified places as the place of occurrence of the external cause: Secondary | ICD-10-CM | POA: Insufficient documentation

## 2012-11-27 DIAGNOSIS — Z87440 Personal history of urinary (tract) infections: Secondary | ICD-10-CM | POA: Insufficient documentation

## 2012-11-27 MED ORDER — HYDROCODONE-ACETAMINOPHEN 5-325 MG PO TABS: 1.0000 | ORAL_TABLET | Freq: Four times a day (QID) | ORAL | Status: DC | PRN

## 2012-11-27 MED ORDER — NAPROXEN 500 MG PO TABS: 500.0000 mg | ORAL_TABLET | Freq: Two times a day (BID) | ORAL | Status: DC

## 2012-11-27 NOTE — ED Provider Notes (Signed)
CSN: [id number]     Arrival date & time 11/27/12  1524 History  This chart was scribed for non-physician practitioner, Felicie Morn, NP, working with Dagmar Hait, MD by Shari Heritage, ED Scribe. This patient was seen in room TR09C/TR09C and the patient's care was started at 3:51 PM.    Chief Complaint  Patient presents with  . Knee Pain   Patient is a 22 y.o. female presenting with knee pain. The history is provided by the patient. No language interpreter was used.  Knee Pain Location:  Knee Injury: yes   Mechanism of injury: fall   Fall:    Fall occurred:  Down stairs   Point of impact:  Knees   Entrapped after fall: no   Knee location:  R knee Pain details:    Radiates to:  Does not radiate   Severity:  Moderate   Timing:  Constant   Progression:  Unchanged Chronicity:  New Foreign body present:  No foreign bodies Associated symptoms: decreased ROM and swelling   Associated symptoms: no fever     HPI Comments: Emma Chambers is a 22 y.o. female who presents to the Emergency Department complaining of moderate, constant, right knee pain onset yesterday. Pain is worse with movement and ambulation.  She states that she was carrying groceries when she tripped and fell down some stairs onto her knee. She reports reduced range or motion secondary to pain. There is associated mild swelling. She denies any other injuries. She denies any other symptoms at this time.   Past Medical History  Diagnosis Date  . UTI (lower urinary tract infection)   . Headache(784.0)   . Gallstones   . Hypertension     GHTN with last pg  . Pregnancy induced hypertension    Past Surgical History  Procedure Laterality Date  . Cholecystectomy N/A 07/27/2012    Procedure: LAPAROSCOPIC CHOLECYSTECTOMY WITH INTRAOPERATIVE CHOLANGIOGRAM;  Surgeon: Lodema Pilot, DO;  Location: [facility];  Service: General;  Laterality: N/A;   Family History  Problem Relation Age of Onset  . Asthma Mother   .  Hypertension Mother   . Parkinsonism Mother   . Diabetes Maternal Grandmother   . Hypertension Maternal Grandmother   . Stroke Maternal Grandmother   . Anesthesia problems Neg Hx   . Malignant hyperthermia Neg Hx   . Pseudochol deficiency Neg Hx   . Hypotension Neg Hx   . Asthma Brother    History  Substance Use Topics  . Smoking status: Never Smoker   . Smokeless tobacco: Never Used  . Alcohol Use: No     Comment: social   OB History   Grav Para Term Preterm Abortions TAB SAB Ect Mult Living   2 2 1 1  0 0 0 0 0 2     Review of Systems  Constitutional: Negative for fever.  Gastrointestinal: Negative for abdominal pain.  Neurological: Negative for headaches.  All other systems reviewed and are negative.    Allergies  Ambien; Amoxicillin; and Dilaudid  Home Medications   Current Outpatient Rx  Name  Route  Sig  Dispense  Refill  . diazepam (VALIUM) 5 MG tablet   Oral   Take 1 tablet (5 mg total) by mouth every 6 (six) hours as needed (muscle spasms).   10 tablet   0    Triage Vitals: BP 119/78  Pulse 103  Temp(Src) 97.8 F (36.6 C) (Oral)  Resp 18  SpO2 100% Physical Exam  Nursing note and  vitals reviewed. Constitutional: She is oriented to person, place, and time. She appears well-developed and well-nourished. No distress.  HENT:  Head: Normocephalic and atraumatic.  Eyes: EOM are normal.  Neck: Neck supple. No tracheal deviation present.  Cardiovascular: Normal rate and intact distal pulses.   Pulmonary/Chest: Effort normal. No respiratory distress.  Musculoskeletal: Normal range of motion. She exhibits tenderness.  Tender over the right knee. Pain with ROM. Distal pulses intact.  Neurological: She is alert and oriented to person, place, and time.  Skin: Skin is warm and dry.  Psychiatric: She has a normal mood and affect. Her behavior is normal.    ED Course  Procedures (including critical care time) DIAGNOSTIC STUDIES: Oxygen Saturation is 100%  on room air, normal by my interpretation.    COORDINATION OF CARE: 3:54 PM- Will order a knee sleeve and crutches. Will get a an x-ray of the right knee to rule out bony deformity. Patient informed of current plan for treatment and evaluation and agrees with plan at this time.     Labs Review Labs Reviewed - No data to display  Imaging Review No results found. Radiology results reviewed and shared with patient. MDM    Right knee pain.  I personally performed the services described in this documentation, which was scribed in my presence. The recorded information has been reviewed and is accurate.      Jimmye Norman, NP 11/28/12 [id number]

## 2012-11-27 NOTE — ED Notes (Signed)
Pt reports she tripped and fell down the stairs yesterday. Reports pain in her right knee and some slight swelling.

## 2012-11-28 NOTE — ED Provider Notes (Signed)
Medical screening examination/treatment/procedure(s) were performed by non-physician practitioner and as supervising physician I was immediately available for consultation/collaboration.   William Makaiah Terwilliger, MD 11/28/12 0044 

## 2012-12-09 ENCOUNTER — Emergency Department (HOSPITAL_COMMUNITY): Payer: Self-pay

## 2012-12-09 ENCOUNTER — Encounter (HOSPITAL_COMMUNITY): Payer: Self-pay | Admitting: Physical Medicine and Rehabilitation

## 2012-12-09 ENCOUNTER — Encounter (HOSPITAL_COMMUNITY): Payer: Self-pay | Admitting: Certified Registered Nurse Anesthetist

## 2012-12-09 ENCOUNTER — Emergency Department (HOSPITAL_COMMUNITY): Payer: Self-pay | Admitting: Certified Registered Nurse Anesthetist

## 2012-12-09 ENCOUNTER — Ambulatory Visit (HOSPITAL_COMMUNITY)
Admission: EM | Admit: 2012-12-09 | Discharge: 2012-12-10 | Disposition: A | Discharge status: Home / Self Care | Payer: Self-pay | Attending: General Surgery | Admitting: General Surgery

## 2012-12-09 ENCOUNTER — Encounter (HOSPITAL_COMMUNITY)
Admission: EM | Disposition: A | Discharge status: Home / Self Care | Payer: Self-pay | Source: Home / Self Care | Attending: Emergency Medicine

## 2012-12-09 DIAGNOSIS — R112 Nausea with vomiting, unspecified: Secondary | ICD-10-CM | POA: Insufficient documentation

## 2012-12-09 DIAGNOSIS — K3580 Unspecified acute appendicitis: Secondary | ICD-10-CM

## 2012-12-09 DIAGNOSIS — M549 Dorsalgia, unspecified: Secondary | ICD-10-CM | POA: Insufficient documentation

## 2012-12-09 DIAGNOSIS — K660 Peritoneal adhesions (postprocedural) (postinfection): Secondary | ICD-10-CM | POA: Insufficient documentation

## 2012-12-09 DIAGNOSIS — R51 Headache: Secondary | ICD-10-CM | POA: Insufficient documentation

## 2012-12-09 DIAGNOSIS — R197 Diarrhea, unspecified: Secondary | ICD-10-CM | POA: Insufficient documentation

## 2012-12-09 DIAGNOSIS — R109 Unspecified abdominal pain: Secondary | ICD-10-CM

## 2012-12-09 DIAGNOSIS — R1031 Right lower quadrant pain: Secondary | ICD-10-CM | POA: Insufficient documentation

## 2012-12-09 DIAGNOSIS — K389 Disease of appendix, unspecified: Secondary | ICD-10-CM | POA: Insufficient documentation

## 2012-12-09 DIAGNOSIS — D72829 Elevated white blood cell count, unspecified: Secondary | ICD-10-CM | POA: Insufficient documentation

## 2012-12-09 HISTORY — PX: LAPAROSCOPIC APPENDECTOMY: SHX408

## 2012-12-09 LAB — COMPREHENSIVE METABOLIC PANEL
ALT: 29 U/L (ref 0–35)
AST: 61 U/L — ABNORMAL HIGH (ref 0–37)
Albumin: 4.1 g/dL (ref 3.5–5.2)
Alkaline Phosphatase: 125 U/L — ABNORMAL HIGH (ref 39–117)
BUN: 8 mg/dL (ref 6–23)
CO2: 25 mEq/L (ref 19–32)
Calcium: 9.5 mg/dL (ref 8.4–10.5)
Chloride: 102 mEq/L (ref 96–112)
Creatinine, Ser: 0.53 mg/dL (ref 0.50–1.10)
GFR calc Af Amer: >90 mL/min (ref >90)
GFR calc non Af Amer: >90 mL/min (ref >90)
Glucose, Bld: 89 mg/dL (ref 70–99)
Potassium: 3.3 mEq/L — ABNORMAL LOW (ref 3.5–5.1)
Sodium: 137 mEq/L (ref 135–145)
Total Bilirubin: 0.6 mg/dL (ref 0.3–1.2)
Total Protein: 7.9 g/dL (ref 6.0–8.3)

## 2012-12-09 LAB — URINALYSIS, ROUTINE W REFLEX MICROSCOPIC
Bilirubin Urine: NEGATIVE
Glucose, UA: NEGATIVE mg/dL
Hgb urine dipstick: NEGATIVE
Ketones, ur: NEGATIVE mg/dL
Leukocytes, UA: NEGATIVE
Nitrite: NEGATIVE
Protein, ur: NEGATIVE mg/dL
Specific Gravity, Urine: 1.02 (ref 1.005–1.030)
Urobilinogen, UA: 1 mg/dL (ref 0.0–1.0)
pH: 8 (ref 5.0–8.0)

## 2012-12-09 LAB — CBC WITH DIFFERENTIAL/PLATELET
Basophils Absolute: 0 10*3/uL (ref 0.0–0.1)
Basophils Relative: 0 % (ref 0–1)
Eosinophils Absolute: 0.3 10*3/uL (ref 0.0–0.7)
Eosinophils Relative: 2 % (ref 0–5)
HCT: 35.8 % — ABNORMAL LOW (ref 36.0–46.0)
Hemoglobin: 11.1 g/dL — ABNORMAL LOW (ref 12.0–15.0)
Lymphocytes Relative: 13 % (ref 12–46)
Lymphs Abs: 2 10*3/uL (ref 0.7–4.0)
MCH: 22.7 pg — ABNORMAL LOW (ref 26.0–34.0)
MCHC: 31 g/dL (ref 30.0–36.0)
MCV: 73.2 fL — ABNORMAL LOW (ref 78.0–100.0)
Monocytes Absolute: 0.8 10*3/uL (ref 0.1–1.0)
Monocytes Relative: 5 % (ref 3–12)
Neutro Abs: 12.3 10*3/uL — ABNORMAL HIGH (ref 1.7–7.7)
Neutrophils Relative %: 80 % — ABNORMAL HIGH (ref 43–77)
Platelets: 351 10*3/uL (ref 150–400)
RBC: 4.89 MIL/uL (ref 3.87–5.11)
RDW: 17.3 % — ABNORMAL HIGH (ref 11.5–15.5)
WBC: 15.4 10*3/uL — ABNORMAL HIGH (ref 4.0–10.5)

## 2012-12-09 LAB — LIPASE, BLOOD: Lipase: 27 U/L (ref 11–59)

## 2012-12-09 LAB — POCT PREGNANCY, URINE: Preg Test, Ur: NEGATIVE

## 2012-12-09 SURGERY — APPENDECTOMY, LAPAROSCOPIC
Anesthesia: General | Site: Abdomen | Wound class: Clean Contaminated

## 2012-12-09 MED ORDER — IOHEXOL 300 MG/ML  SOLN
100.0000 mL | Freq: Once | INTRAMUSCULAR | Status: AC | PRN
  Administered 2012-12-09: 100 mL via INTRAVENOUS

## 2012-12-09 MED ORDER — LIDOCAINE HCL (CARDIAC) 20 MG/ML IV SOLN
INTRAVENOUS | Status: DC | PRN
  Administered 2012-12-09: 70 mg via INTRAVENOUS

## 2012-12-09 MED ORDER — SODIUM CHLORIDE 0.9 % IV BOLUS (SEPSIS)
1000.0000 mL | Freq: Once | INTRAVENOUS | Status: AC
  Administered 2012-12-09: 1000 mL via INTRAVENOUS

## 2012-12-09 MED ORDER — OXYCODONE HCL 5 MG PO TABS: 5.0000 mg | ORAL_TABLET | Freq: Once | ORAL | Status: AC | PRN

## 2012-12-09 MED ORDER — OXYCODONE-ACETAMINOPHEN 5-325 MG PO TABS
1.0000 | ORAL_TABLET | Freq: Once | ORAL | Status: AC
  Administered 2012-12-09: 1 via ORAL
  Filled 2012-12-09: qty 1

## 2012-12-09 MED ORDER — BUPIVACAINE-EPINEPHRINE PF 0.25-1:200000 % IJ SOLN
INTRAMUSCULAR | Status: AC
  Filled 2012-12-09: qty 30

## 2012-12-09 MED ORDER — SODIUM CHLORIDE 0.9 % IR SOLN
Status: DC | PRN
  Administered 2012-12-09: 1000 mL

## 2012-12-09 MED ORDER — LACTATED RINGERS IV SOLN
INTRAVENOUS | Status: DC | PRN
  Administered 2012-12-09: 20:00:00 via INTRAVENOUS

## 2012-12-09 MED ORDER — FENTANYL CITRATE 0.05 MG/ML IJ SOLN
25.0000 ug | INTRAMUSCULAR | Status: DC | PRN
  Administered 2012-12-09 – 2012-12-10 (×2): 50 ug via INTRAVENOUS

## 2012-12-09 MED ORDER — ONDANSETRON HCL 4 MG/2ML IJ SOLN
INTRAMUSCULAR | Status: DC | PRN
  Administered 2012-12-09: 4 mg via INTRAVENOUS

## 2012-12-09 MED ORDER — BUPIVACAINE-EPINEPHRINE 0.25% -1:200000 IJ SOLN
INTRAMUSCULAR | Status: DC | PRN
  Administered 2012-12-09: 30 mL

## 2012-12-09 MED ORDER — PROPOFOL 10 MG/ML IV BOLUS
INTRAVENOUS | Status: DC | PRN
  Administered 2012-12-09: 200 mg via INTRAVENOUS

## 2012-12-09 MED ORDER — METOCLOPRAMIDE HCL 5 MG/ML IJ SOLN
10.0000 mg | Freq: Once | INTRAMUSCULAR | Status: AC
  Administered 2012-12-09: 10 mg via INTRAVENOUS
  Filled 2012-12-09: qty 2

## 2012-12-09 MED ORDER — GI COCKTAIL ~~LOC~~
30.0000 mL | Freq: Once | ORAL | Status: AC
  Administered 2012-12-09: 30 mL via ORAL
  Filled 2012-12-09: qty 30

## 2012-12-09 MED ORDER — NEOSTIGMINE METHYLSULFATE 1 MG/ML IJ SOLN
INTRAMUSCULAR | Status: DC | PRN
  Administered 2012-12-09: 3 mg via INTRAVENOUS

## 2012-12-09 MED ORDER — SUCCINYLCHOLINE CHLORIDE 20 MG/ML IJ SOLN
INTRAMUSCULAR | Status: DC | PRN
  Administered 2012-12-09: 100 mg via INTRAVENOUS

## 2012-12-09 MED ORDER — GLYCOPYRROLATE 0.2 MG/ML IJ SOLN
INTRAMUSCULAR | Status: DC | PRN
  Administered 2012-12-09: 0.4 mg via INTRAVENOUS

## 2012-12-09 MED ORDER — OXYCODONE HCL 5 MG/5ML PO SOLN: 5.0000 mg | Freq: Once | ORAL | Status: AC | PRN

## 2012-12-09 MED ORDER — IOHEXOL 300 MG/ML  SOLN
25.0000 mL | INTRAMUSCULAR | Status: AC
  Administered 2012-12-09: 25 mL via ORAL

## 2012-12-09 MED ORDER — ONDANSETRON HCL 4 MG/2ML IJ SOLN
4.0000 mg | Freq: Once | INTRAMUSCULAR | Status: AC
  Administered 2012-12-09: 4 mg via INTRAVENOUS
  Filled 2012-12-09: qty 2

## 2012-12-09 MED ORDER — ONDANSETRON HCL 4 MG/2ML IJ SOLN: 4.0000 mg | Freq: Four times a day (QID) | INTRAMUSCULAR | Status: DC | PRN

## 2012-12-09 MED ORDER — DEXAMETHASONE SODIUM PHOSPHATE 4 MG/ML IJ SOLN
INTRAMUSCULAR | Status: DC | PRN
  Administered 2012-12-09: 4 mg via INTRAVENOUS

## 2012-12-09 MED ORDER — FENTANYL CITRATE 0.05 MG/ML IJ SOLN
INTRAMUSCULAR | Status: AC
  Filled 2012-12-09: qty 2

## 2012-12-09 MED ORDER — MORPHINE SULFATE 4 MG/ML IJ SOLN
4.0000 mg | Freq: Once | INTRAMUSCULAR | Status: AC
  Administered 2012-12-09: 4 mg via INTRAVENOUS
  Filled 2012-12-09: qty 1

## 2012-12-09 MED ORDER — OXYCODONE HCL 5 MG PO TABS
ORAL_TABLET | ORAL | Status: AC
  Administered 2012-12-10: 5 mg
  Filled 2012-12-09: qty 1

## 2012-12-09 MED ORDER — MIDAZOLAM HCL 5 MG/5ML IJ SOLN
INTRAMUSCULAR | Status: DC | PRN
  Administered 2012-12-09: 2 mg via INTRAVENOUS

## 2012-12-09 MED ORDER — ROCURONIUM BROMIDE 100 MG/10ML IV SOLN
INTRAVENOUS | Status: DC | PRN
  Administered 2012-12-09: 30 mg via INTRAVENOUS

## 2012-12-09 MED ORDER — CIPROFLOXACIN IN D5W 400 MG/200ML IV SOLN
400.0000 mg | INTRAVENOUS | Status: AC
  Administered 2012-12-09: 400 mg via INTRAVENOUS
  Filled 2012-12-09: qty 200

## 2012-12-09 MED ORDER — FENTANYL CITRATE 0.05 MG/ML IJ SOLN
INTRAMUSCULAR | Status: DC | PRN
  Administered 2012-12-09 (×2): 50 ug via INTRAVENOUS
  Administered 2012-12-09: 25 ug via INTRAVENOUS
  Administered 2012-12-09: 100 ug via INTRAVENOUS
  Administered 2012-12-09 (×2): 50 ug via INTRAVENOUS

## 2012-12-09 SURGICAL SUPPLY — 42 items
APPLIER CLIP ROT 10 11.4 M/L (STAPLE)
BLADE SURG ROTATE 9660 (MISCELLANEOUS) IMPLANT
CANISTER SUCTION 2500CC (MISCELLANEOUS) ×2 IMPLANT
CHLORAPREP W/TINT 26ML (MISCELLANEOUS) ×2 IMPLANT
CLIP APPLIE ROT 10 11.4 M/L (STAPLE) IMPLANT
CLOTH BEACON ORANGE TIMEOUT ST (SAFETY) IMPLANT
COVER SURGICAL LIGHT HANDLE (MISCELLANEOUS) ×2 IMPLANT
CUTTER LINEAR ENDO 35 ETS (STAPLE) ×2 IMPLANT
CUTTER LINEAR ENDO 35 ETS TH (STAPLE) IMPLANT
DECANTER SPIKE VIAL GLASS SM (MISCELLANEOUS) IMPLANT
DERMABOND ADVANCED (GAUZE/BANDAGES/DRESSINGS) ×1
DERMABOND ADVANCED .7 DNX12 (GAUZE/BANDAGES/DRESSINGS) ×1 IMPLANT
DRAPE UTILITY 15X26 W/TAPE STR (DRAPE) ×6 IMPLANT
DRSG TEGADERM 2-3/8X2-3/4 SM (GAUZE/BANDAGES/DRESSINGS) ×6 IMPLANT
ELECT REM PT RETURN 9FT ADLT (ELECTROSURGICAL) ×2
ELECTRODE REM PT RTRN 9FT ADLT (ELECTROSURGICAL) ×1 IMPLANT
ENDOLOOP SUT PDS II  0 18 (SUTURE)
ENDOLOOP SUT PDS II 0 18 (SUTURE) IMPLANT
GLOVE BIOGEL PI IND STRL 8 (GLOVE) ×3 IMPLANT
GLOVE BIOGEL PI INDICATOR 8 (GLOVE) ×3
GLOVE ECLIPSE 7.5 STRL STRAW (GLOVE) ×2 IMPLANT
GOWN STRL NON-REIN LRG LVL3 (GOWN DISPOSABLE) ×4 IMPLANT
KIT BASIN OR (CUSTOM PROCEDURE TRAY) ×2 IMPLANT
KIT ROOM TURNOVER OR (KITS) ×2 IMPLANT
NS IRRIG 1000ML POUR BTL (IV SOLUTION) ×2 IMPLANT
PAD ARMBOARD 7.5X6 YLW CONV (MISCELLANEOUS) ×4 IMPLANT
PENCIL BUTTON HOLSTER BLD 10FT (ELECTRODE) IMPLANT
POUCH SPECIMEN RETRIEVAL 10MM (ENDOMECHANICALS) ×2 IMPLANT
RELOAD /EVU35 (ENDOMECHANICALS) ×2 IMPLANT
RELOAD CUTTER ETS 35MM STAND (ENDOMECHANICALS) ×2 IMPLANT
SCISSORS LAP 5X35 DISP (ENDOMECHANICALS) ×2 IMPLANT
SET IRRIG TUBING LAPAROSCOPIC (IRRIGATION / IRRIGATOR) ×2 IMPLANT
SPECIMEN JAR SMALL (MISCELLANEOUS) ×2 IMPLANT
SUT MNCRL AB 4-0 PS2 18 (SUTURE) ×2 IMPLANT
TOWEL OR 17X24 6PK STRL BLUE (TOWEL DISPOSABLE) ×2 IMPLANT
TOWEL OR 17X26 10 PK STRL BLUE (TOWEL DISPOSABLE) ×2 IMPLANT
TRAY FOLEY CATH 16FR SILVER (SET/KITS/TRAYS/PACK) ×2 IMPLANT
TRAY LAPAROSCOPIC (CUSTOM PROCEDURE TRAY) ×2 IMPLANT
TROCAR XCEL 12X100 BLDLESS (ENDOMECHANICALS) ×2 IMPLANT
TROCAR XCEL BLUNT TIP 100MML (ENDOMECHANICALS) ×2 IMPLANT
TROCAR XCEL NON-BLD 5MMX100MML (ENDOMECHANICALS) ×2 IMPLANT
WATER STERILE IRR 1000ML POUR (IV SOLUTION) IMPLANT

## 2012-12-09 NOTE — ED Notes (Addendum)
OR ready for patient. Report phoned to Wray Kearns, CRNA. Patient transported to OR holding by tech. Family with patient and has all of her personal belongings. Cipro 400 mg IVPB sent with patient

## 2012-12-09 NOTE — Op Note (Signed)
OPERATIVE REPORT  DATE OF OPERATION: 12/09/2012  PATIENT:  Emma Chambers  22 y.o. female  PRE-OPERATIVE DIAGNOSIS:  Early acute appendicitis  POST-OPERATIVE DIAGNOSIS:  Possible early acute appendicitis with an appendolith  PROCEDURE:  Procedure(s): APPENDECTOMY LAPAROSCOPIC  SURGEON:  Surgeon(s): Cherylynn Ridges, MD  ASSISTANT: None  ANESTHESIA:   general  EBL: <20 ml  BLOOD ADMINISTERED: none  DRAINS: none   SPECIMEN:  Source of Specimen:  Appendix  COUNTS CORRECT:  YES  PROCEDURE DETAILS: The patient was taken to the operating room and placed on the table in the supine position. After an adequate general endotracheal anesthetic was administered she was prepped and draped in usual sterile manner exposing her entire abdomen.  After proper time out was performed identifying the patient and the procedure to be performed, an infraumbilical midline incision was made using a #15 blade and taken down to the midline fascia. We incised the midline fascia using a #15 blade then grabbed the edges with a Coker clamp. We dissected down into the peritoneal cavity then passed a fascial suture around a pursestring suture of 0 Vicryl. This secured in a Hassan cannula which was subsequently used to insufflate the abdomen with carbon dioxide gas up to maximal intra-abdominal pressure of 15 mm of mercury.  A right upper quadrant 5 mm cannula and the left lower quadrant 12 mm cannula passed under direct vision. The patient was placed in Trendelenburg less of was tilted down.  The appendix at folded medially it was attached to the retroperitoneum and the lateral wall with normal noninflamed peritoneal tissue. We dissected this away. The appendix did not appear to be acutely inflamed. However, there was an appendicolith at the base. We came across the base of the appendix using a blue stapler 3.5 mm Endo GIA. We came across the mesoappendix using a white 2.5 mm Endo GIA once this was done the  appendix was completely detached we able to retrieve her from the left lower quadrant site using an Endo Catch bag.  The resection site was clean without obvious bleeding. There were adhesions of the cecum to the intra-abdominal wall which we to down using cauterize endoscopic scissors.  Once this was completed the [facility] cannula was removed and we closed the fascia using a Pershing suture which is in place. We then injected all sites were quarter percent Marcaine. We allowed all fluid and gas be aspirated using aspirated. The skin at the infraumbilical site was closed using running subcuticular stitch of 4-0 Monocryl. All other incision sites were closed using Dermabond Steri-Strips and Tegaderms sterile dressings were applied all Chambers counts, sponge counts, then this may counts were correct.  PATIENT DISPOSITION:  PACU - hemodynamically stable.   Cherylynn Ridges 9/24/20149:36 PM

## 2012-12-09 NOTE — Anesthesia Postprocedure Evaluation (Signed)
Anesthesia Post Note  Patient: Emma Chambers  Procedure(s) Performed: Procedure(s) (LRB): APPENDECTOMY LAPAROSCOPIC (N/A)  Anesthesia type: General  Patient location: PACU  Post pain: Pain level controlled and Adequate analgesia  Post assessment: Post-op Vital signs reviewed, Patient's Cardiovascular Status Stable, Respiratory Function Stable, Patent Airway and Pain level controlled  Last Vitals:  Filed Vitals:   12/09/12 1900  BP: 117/78  Pulse: 82  Temp:   Resp:     Post vital signs: Reviewed and stable  Level of consciousness: awake, alert  and oriented  Complications: No apparent anesthesia complications

## 2012-12-09 NOTE — ED Notes (Signed)
Pt returned from CT °

## 2012-12-09 NOTE — ED Provider Notes (Signed)
CSN: [id number]     Arrival date & time 12/09/12  1134 History   First MD Initiated Contact with Patient 12/09/12 1307     Chief Complaint  Patient presents with  . Abdominal Pain  . Emesis   (Consider location/radiation/quality/duration/timing/severity/associated sxs/prior Treatment) HPI  22 year old G2P2 female with history of recurrent UTI presents complaining of abdominal pain. Patient reports sharp pain to her right back radiates to right abdomen with associated nausea and vomiting and diarrhea since last night. reports nonbloody, nonbilious vomit, along with nonbloody and nonmucous the diarrhea. States she has decrease in appetite. Movement seems to make the pain worse. Nothing makes it better. No specific treatment tried. Pain is currently 10 out of 10. No associated fever, chills, headache, productive cough, hemoptysis, dysuria, hematuria, vaginal discharge, rash, numbness or weakness. She cannot recall her last menstrual period, currently on IUD. Patient has had prior cholecystectomy. No recent alcohol use, no history of diabetes.  Past Medical History  Diagnosis Date  . UTI (lower urinary tract infection)   . Headache(784.0)   . Gallstones   . Hypertension     GHTN with last pg  . Pregnancy induced hypertension    Past Surgical History  Procedure Laterality Date  . Cholecystectomy N/A 07/27/2012    Procedure: LAPAROSCOPIC CHOLECYSTECTOMY WITH INTRAOPERATIVE CHOLANGIOGRAM;  Surgeon: Lodema Pilot, DO;  Location: [facility];  Service: General;  Laterality: N/A;   Family History  Problem Relation Age of Onset  . Asthma Mother   . Hypertension Mother   . Parkinsonism Mother   . Diabetes Maternal Grandmother   . Hypertension Maternal Grandmother   . Stroke Maternal Grandmother   . Anesthesia problems Neg Hx   . Malignant hyperthermia Neg Hx   . Pseudochol deficiency Neg Hx   . Hypotension Neg Hx   . Asthma Brother    History  Substance Use Topics  . Smoking status: Never  Smoker   . Smokeless tobacco: Never Used  . Alcohol Use: No     Comment: social   OB History   Grav Para Term Preterm Abortions TAB SAB Ect Mult Living   2 2 1 1  0 0 0 0 0 2     Review of Systems  Constitutional: Negative for fever.  Respiratory: Negative for shortness of breath.   Cardiovascular: Negative for chest pain.  Gastrointestinal: Positive for abdominal pain.  Genitourinary: Positive for flank pain. Negative for pelvic pain.  Musculoskeletal: Negative for back pain.  Skin: Negative for rash.  All other systems reviewed and are negative.    Allergies  Ambien; Amoxicillin; and Dilaudid  Home Medications   Current Outpatient Rx  Name  Route  Sig  Dispense  Refill  . HYDROcodone-acetaminophen (NORCO/VICODIN) 5-325 MG per tablet   Oral   Take 1 tablet by mouth every 6 (six) hours as needed for pain.   10 tablet   0   . naproxen (NAPROSYN) 500 MG tablet   Oral   Take 1 tablet (500 mg total) by mouth 2 (two) times daily.   14 tablet   0    BP 103/79  Pulse 81  Temp(Src) 98.2 F (36.8 C) (Oral)  Resp 24  SpO2 100% Physical Exam  Nursing note and vitals reviewed. Constitutional: She is oriented to person, place, and time.  Morbidly obese female appears to be uncomfortable.  HENT:  Head: Normocephalic and atraumatic.  Mouth/Throat: Oropharynx is clear and moist.  Eyes: Conjunctivae are normal.  Neck: Neck supple.  Cardiovascular: Normal  rate and regular rhythm.   Pulmonary/Chest: Effort normal and breath sounds normal.  Abdominal: Soft. Bowel sounds are normal. There is tenderness (Right upper quadrant tenderness on palpation without guarding or rebound tenderness. Epigastric tenderness on palpation. No McBurney's point. No low abdominal tenderness. Well-healing laparoscopic surgical scar without any signs of infection or hernia).  Genitourinary:  No CVA tenderness  Musculoskeletal:  No significant midline spine tenderness, crepitus, or step off noted   Neurological: She is alert and oriented to person, place, and time.  Skin: No rash noted.  Psychiatric: She has a normal mood and affect.    ED Course  Procedures (including critical care time)  1:49 PM Patient presents with right upper quadrant abdominal pain and epigastric pain. She has had prior cholecystectomy. She has no low abdominal pain concerning for GU symptoms at this time. No cough, shortness of breath, chest pain concerning for pneumonia or cardiopulmonary disease.  She is afebrile with stable normal vital signs. Work up initiated.  3:58 PM Care discussed with attending and with oncoming PA, who will continue to monitor pt.  If Abd US shows retained stone, pt will need consultation to GI.  If US unremarkable, will continue with abd CT for further evaluation.  Sxs control.     Labs Review Labs Reviewed  CBC WITH DIFFERENTIAL - Abnormal; Notable for the following:    WBC 15.4 (*)    Hemoglobin 11.1 (*)    HCT 35.8 (*)    MCV 73.2 (*)    MCH 22.7 (*)    RDW 17.3 (*)    Neutrophils Relative % 80 (*)    Neutro Abs 12.3 (*)    All other components within normal limits  COMPREHENSIVE METABOLIC PANEL - Abnormal; Notable for the following:    Potassium 3.3 (*)    AST 61 (*)    Alkaline Phosphatase 125 (*)    All other components within normal limits  LIPASE, BLOOD  URINALYSIS, ROUTINE W REFLEX MICROSCOPIC  POCT PREGNANCY, URINE   Imaging Review US Abdomen Complete  12/09/2012   CLINICAL DATA:  Abdominal pain  EXAM: ABDOMEN ULTRASOUND  COMPARISON:  The May 31, 2012  FINDINGS: Gallbladder  Surgically absent.  Common bile duct  Diameter: 9 mm, mildly prominent but probably within normal limits given post cholecystectomy state. The distal common bile duct is obscured by gas. There is no intrahepatic duct dilatation.  Liver  There is fatty change in the liver. No focal liver lesions are identified.  IVC  No abnormality visualized.  Pancreas  Visualized portion unremarkable.  The portions of pancreas obscured by gas.  Spleen  Size and appearance within normal limits.  Right Kidney  Length: 11.5 cm. Echogenicity within normal limits. No mass or hydronephrosis visualized.  Left Kidney  Length: 12.2 cm. Echogenicity within normal limits. No mass or hydronephrosis visualized.  Abdominal aorta  No aneurysm visualized.  IMPRESSION: Portions of the pancreas and common bile duct are obscured by gas. Proximal common bile duct is mildly prominent but probably within normal limits given post cholecystectomy state. If there remains concern for potential biliary duct obstruction, MRCP could be helpful to further evaluate. The visualized portions of the pancreas appear normal.  There is fatty liver. While no focal liver lesions are identified, it must be cautioned that the sensitivity of ultrasound for more subtle liver lesions is diminished given underlying fatty change.  The gallbladder is absent.  Study is otherwise unremarkable.   Electronically Signed   By: Chrissie Noa  Woodruff   On: 12/09/2012 16:43   Ct Abdomen Pelvis W Contrast  12/09/2012   ADDENDUM REPORT: 12/09/2012 17:36  ADDENDUM: The appendix is in front of L4-L5 disc space. Seen in sagittal image 50.   Electronically Signed   By: Natasha Mead   On: 12/09/2012 17:36   12/09/2012   CLINICAL DATA:  Right side abdominal pain, nausea and vomiting  EXAM: CT ABDOMEN AND PELVIS WITH CONTRAST  TECHNIQUE: Multidetector CT imaging of the abdomen and pelvis was performed using the standard protocol following bolus administration of intravenous contrast.  CONTRAST:  OMNIPAQUE IOHEXOL 300 MG/ML  SOLN  COMPARISON:  None.  FINDINGS: Lung bases are unremarkable. Small amount of air noted in distal esophagus.  Sagittal images of the spine are unremarkable.  There are streaky artifacts from patient's large body habitus. The patient is status postcholecystectomy. No aortic aneurysm. CBD measures 8.5 mm in diameter. Enhanced liver is unremarkable. The  pancreas spleen and adrenal glands are unremarkable. Kidneys are symmetrical in size and enhancement. No focal renal mass.  No hydronephrosis or hydroureter.  No small bowel obstruction. No ascites or free air. No adenopathy. Small nonspecific lymph nodes are noted in right lower quadrant mesentery with the largest measures 10 x 10 mm.  A low lying cecum is noted.  IUD noted.  In axial image 58 there is mild thickening of the appendix mild enhancement of the wall and minimal stranding of surrounding fat. The appendix measures 1.1 cm in diameter. The appendix is retrocecal just in front of L4 vertebral body. Findings are highly suspicious for early appendicitis. Clinical correlation is necessary. No adnexal masses noted. Urinary bladder is unremarkable. No inguinal adenopathy.  IMPRESSION: 1. In axial image 58 there is mild thickening of the appendix mild enhancement of the wall and minimal stranding of surrounding fat. The appendix measures 1.1 cm in diameter. The appendix is retrocecal just in front of L4 vertebral body. Findings are highly suspicious for early appendicitis. Clinical correlation is necessary. 2. Status postcholecystectomy. 3. There is a low lying cecum. 4. No small bowel obstruction. 5. IUD in place.  Electronically Signed: By: Natasha Mead On: 12/09/2012 17:22    MDM   1. Appendicitis, acute   2. Nausea and vomiting   3. Abdominal  pain, other specified site   4. Leukocytosis    BP 105/51  Pulse 66  Temp(Src) 98.1 F (36.7 C) (Oral)  Resp 18  Ht 5\' 4"  (1.626 m)  Wt 231 lb 7.7 oz (105 kg)  BMI 39.71 kg/m2  SpO2 100%  I have reviewed nursing notes and vital signs. I personally reviewed the imaging tests through PACS system  I reviewed available ER/hospitalization records thought the EMR     Fayrene Helper, [place] 12/10/12 [id number]

## 2012-12-09 NOTE — Preoperative (Signed)
Beta Blockers   Reason not to administer Beta Blockers:Not Applicable 

## 2012-12-09 NOTE — ED Notes (Signed)
Patient transported to CT 

## 2012-12-09 NOTE — H&P (Signed)
Emma Chambers is an 22 y.o. female.   Chief Complaint: Back pain and abdominal pain. HPI: Since last night the patient has been having midline mid and lower back pain.  Poor appetite, nausea and vomiting.  Abdominal pain is sporadic, moves from the epigastric and RUQ area to the RLQ.  Most of her complaints are back pain.  CT done which shows possible early acute appendicitis, but I reviewed the CT and do not find any significant periappendiceal stranding or inflammation.  There is nothing on examination that would explain her back pain.  Past Medical History  Diagnosis Date  . UTI (lower urinary tract infection)   . Headache(784.0)   . Gallstones   . Hypertension     GHTN with last pg  . Pregnancy induced hypertension     Past Surgical History  Procedure Laterality Date  . Cholecystectomy N/A 07/27/2012    Procedure: LAPAROSCOPIC CHOLECYSTECTOMY WITH INTRAOPERATIVE CHOLANGIOGRAM;  Surgeon: Lodema Pilot, DO;  Location: [facility];  Service: General;  Laterality: N/A;    Family History  Problem Relation Age of Onset  . Asthma Mother   . Hypertension Mother   . Parkinsonism Mother   . Diabetes Maternal Grandmother   . Hypertension Maternal Grandmother   . Stroke Maternal Grandmother   . Anesthesia problems Neg Hx   . Malignant hyperthermia Neg Hx   . Pseudochol deficiency Neg Hx   . Hypotension Neg Hx   . Asthma Brother    Social History:  reports that she has never smoked. She has never used smokeless tobacco. She reports that she does not drink alcohol or use illicit drugs.  Allergies:  Allergies  Allergen Reactions  . Ambien [Zolpidem Tartrate] Other (See Comments)    Caused her to have hallucinations  . Amoxicillin Nausea And Vomiting and Other (See Comments)    Tics & twitches  . Dilaudid [Hydromorphone Hcl] Itching     (Not in a hospital admission)  Results for orders placed during the hospital encounter of 12/09/12 (from the past 48 hour(s))  CBC WITH  DIFFERENTIAL     Status: Abnormal   Collection Time    12/09/12  1:30 PM      Result Value Range   WBC 15.4 (*) 4.0 - 10.5 K/uL   RBC 4.89  3.87 - 5.11 MIL/uL   Hemoglobin 11.1 (*) 12.0 - 15.0 g/dL   HCT 14.7 (*) 82.9 - 56.2 %   MCV 73.2 (*) 78.0 - 100.0 fL   MCH 22.7 (*) 26.0 - 34.0 pg   MCHC 31.0  30.0 - 36.0 g/dL   RDW 13.0 (*) 86.5 - 78.4 %   Platelets 351  150 - 400 K/uL   Neutrophils Relative % 80 (*) 43 - 77 %   Neutro Abs 12.3 (*) 1.7 - 7.7 K/uL   Lymphocytes Relative 13  12 - 46 %   Lymphs Abs 2.0  0.7 - 4.0 K/uL   Monocytes Relative 5  3 - 12 %   Monocytes Absolute 0.8  0.1 - 1.0 K/uL   Eosinophils Relative 2  0 - 5 %   Eosinophils Absolute 0.3  0.0 - 0.7 K/uL   Basophils Relative 0  0 - 1 %   Basophils Absolute 0.0  0.0 - 0.1 K/uL  COMPREHENSIVE METABOLIC PANEL     Status: Abnormal   Collection Time    12/09/12  1:30 PM      Result Value Range   Sodium 137  135 - 145  mEq/L   Potassium 3.3 (*) 3.5 - 5.1 mEq/L   Chloride 102  96 - 112 mEq/L   CO2 25  19 - 32 mEq/L   Glucose, Bld 89  70 - 99 mg/dL   BUN 8  6 - 23 mg/dL   Creatinine, Ser 1.61  0.50 - 1.10 mg/dL   Calcium 9.5  8.4 - 09.6 mg/dL   Total Protein 7.9  6.0 - 8.3 g/dL   Albumin 4.1  3.5 - 5.2 g/dL   AST 61 (*) 0 - 37 U/L   ALT 29  0 - 35 U/L   Alkaline Phosphatase 125 (*) 39 - 117 U/L   Total Bilirubin 0.6  0.3 - 1.2 mg/dL   GFR calc non Af Amer >90  >90 mL/min   GFR calc Af Amer >90  >90 mL/min   Comment: (NOTE)     The eGFR has been calculated using the CKD EPI equation.     This calculation has not been validated in all clinical situations.     eGFR's persistently <90 mL/min signify possible Chronic Kidney     Disease.  LIPASE, BLOOD     Status: None   Collection Time    12/09/12  1:30 PM      Result Value Range   Lipase 27  11 - 59 U/L  URINALYSIS, ROUTINE W REFLEX MICROSCOPIC     Status: None   Collection Time    12/09/12  2:35 PM      Result Value Range   Color, Urine YELLOW  YELLOW    APPearance CLEAR  CLEAR   Specific Gravity, Urine 1.020  1.005 - 1.030   pH 8.0  5.0 - 8.0   Glucose, UA NEGATIVE  NEGATIVE mg/dL   Hgb urine dipstick NEGATIVE  NEGATIVE   Bilirubin Urine NEGATIVE  NEGATIVE   Ketones, ur NEGATIVE  NEGATIVE mg/dL   Protein, ur NEGATIVE  NEGATIVE mg/dL   Urobilinogen, UA 1.0  0.0 - 1.0 mg/dL   Nitrite NEGATIVE  NEGATIVE   Leukocytes, UA NEGATIVE  NEGATIVE   Comment: MICROSCOPIC NOT DONE ON URINES WITH NEGATIVE PROTEIN, BLOOD, LEUKOCYTES, NITRITE, OR GLUCOSE <1000 mg/dL.  POCT PREGNANCY, URINE     Status: None   Collection Time    12/09/12  2:48 PM      Result Value Range   Preg Test, Ur NEGATIVE  NEGATIVE   Comment:            THE SENSITIVITY OF THIS     METHODOLOGY IS >24 mIU/mL   US Abdomen Complete  12/09/2012   CLINICAL DATA:  Abdominal pain  EXAM: ABDOMEN ULTRASOUND  COMPARISON:  The May 31, 2012  FINDINGS: Gallbladder  Surgically absent.  Common bile duct  Diameter: 9 mm, mildly prominent but probably within normal limits given post cholecystectomy state. The distal common bile duct is obscured by gas. There is no intrahepatic duct dilatation.  Liver  There is fatty change in the liver. No focal liver lesions are identified.  IVC  No abnormality visualized.  Pancreas  Visualized portion unremarkable. The portions of pancreas obscured by gas.  Spleen  Size and appearance within normal limits.  Right Kidney  Length: 11.5 cm. Echogenicity within normal limits. No mass or hydronephrosis visualized.  Left Kidney  Length: 12.2 cm. Echogenicity within normal limits. No mass or hydronephrosis visualized.  Abdominal aorta  No aneurysm visualized.  IMPRESSION: Portions of the pancreas and common bile duct are obscured by gas. Proximal common  bile duct is mildly prominent but probably within normal limits given post cholecystectomy state. If there remains concern for potential biliary duct obstruction, MRCP could be helpful to further evaluate. The visualized  portions of the pancreas appear normal.  There is fatty liver. While no focal liver lesions are identified, it must be cautioned that the sensitivity of ultrasound for more subtle liver lesions is diminished given underlying fatty change.  The gallbladder is absent.  Study is otherwise unremarkable.   Electronically Signed   By: Bretta Bang   On: 12/09/2012 16:43   Ct Abdomen Pelvis W Contrast  12/09/2012   ADDENDUM REPORT: 12/09/2012 17:36  ADDENDUM: The appendix is in front of L4-L5 disc space. Seen in sagittal image 50.   Electronically Signed   By: Natasha Mead   On: 12/09/2012 17:36   12/09/2012   CLINICAL DATA:  Right side abdominal pain, nausea and vomiting  EXAM: CT ABDOMEN AND PELVIS WITH CONTRAST  TECHNIQUE: Multidetector CT imaging of the abdomen and pelvis was performed using the standard protocol following bolus administration of intravenous contrast.  CONTRAST:  OMNIPAQUE IOHEXOL 300 MG/ML  SOLN  COMPARISON:  None.  FINDINGS: Lung bases are unremarkable. Small amount of air noted in distal esophagus.  Sagittal images of the spine are unremarkable.  There are streaky artifacts from patient's large body habitus. The patient is status postcholecystectomy. No aortic aneurysm. CBD measures 8.5 mm in diameter. Enhanced liver is unremarkable. The pancreas spleen and adrenal glands are unremarkable. Kidneys are symmetrical in size and enhancement. No focal renal mass.  No hydronephrosis or hydroureter.  No small bowel obstruction. No ascites or free air. No adenopathy. Small nonspecific lymph nodes are noted in right lower quadrant mesentery with the largest measures 10 x 10 mm.  A low lying cecum is noted.  IUD noted.  In axial image 58 there is mild thickening of the appendix mild enhancement of the wall and minimal stranding of surrounding fat. The appendix measures 1.1 cm in diameter. The appendix is retrocecal just in front of L4 vertebral body. Findings are highly suspicious for early  appendicitis. Clinical correlation is necessary. No adnexal masses noted. Urinary bladder is unremarkable. No inguinal adenopathy.  IMPRESSION: 1. In axial image 58 there is mild thickening of the appendix mild enhancement of the wall and minimal stranding of surrounding fat. The appendix measures 1.1 cm in diameter. The appendix is retrocecal just in front of L4 vertebral body. Findings are highly suspicious for early appendicitis. Clinical correlation is necessary. 2. Status postcholecystectomy. 3. There is a low lying cecum. 4. No small bowel obstruction. 5. IUD in place.  Electronically Signed: By: Natasha Mead On: 12/09/2012 17:22    Review of Systems  Constitutional: Negative for fever and chills.  HENT: Negative.   Gastrointestinal: Positive for nausea, vomiting and abdominal pain.  Musculoskeletal: Positive for back pain.  Skin: Negative.   All other systems reviewed and are negative.    Blood pressure 156/94, pulse 58, temperature 98.2 F (36.8 C), temperature source Oral, resp. rate 24, SpO2 100.00%. Physical Exam  Constitutional: She is oriented to person, place, and time. She appears well-nourished.  Obese  HENT:  Head: Normocephalic and atraumatic.  Cardiovascular: Normal rate, regular rhythm and normal heart sounds.   Respiratory: Effort normal and breath sounds normal.  GI: Soft. There is tenderness in the right lower quadrant and left lower quadrant. There is no rigidity, no rebound and no guarding.    Musculoskeletal: Normal  range of motion.  Neurological: She is alert and oriented to person, place, and time. She has normal reflexes.  Skin: Skin is warm and dry.  Psychiatric: She has a normal mood and affect. Her behavior is normal. Judgment and thought content normal.     Assessment/Plan Early acute appendicitis by CT, but I am concerned that her symptoms are far beyond what her findings are on the CT.  She has minimal stranding, no abscess, no evidence of perforation.   Not sure if all her symptoms are from her appndix, or even if she really has appendicitis.  I do feel as though she warrants an appendectomy because she has RLQ tenderness and has leukocytosis--that along with the CT findings.  Will go ahead with Lap Appy.  Cherylynn Ridges 12/09/2012, 5:56 PM

## 2012-12-09 NOTE — Transfer of Care (Signed)
Immediate Anesthesia Transfer of Care Note  Patient: Emma Chambers  Procedure(s) Performed: Procedure(s): APPENDECTOMY LAPAROSCOPIC (N/A)  Patient Location: PACU  Anesthesia Type:General  Level of Consciousness: awake, alert  and patient cooperative  Airway & Oxygen Therapy: Patient Spontanous Breathing and Patient connected to nasal cannula oxygen  Post-op Assessment: Report given to PACU RN, Post -op Vital signs reviewed and stable and Patient moving all extremities  Post vital signs: Reviewed and stable  Complications: No apparent anesthesia complications

## 2012-12-09 NOTE — ED Notes (Signed)
Pt returned from US

## 2012-12-09 NOTE — Anesthesia Procedure Notes (Signed)
Procedure Name: Intubation Date/Time: 12/09/2012 8:37 PM Performed by: Orvilla Fus A Pre-anesthesia Checklist: Patient identified, Timeout performed, Emergency Drugs available, Suction available and Patient being monitored Patient Re-evaluated:Patient Re-evaluated prior to inductionOxygen Delivery Method: Circle system utilized Preoxygenation: Pre-oxygenation with 100% oxygen Intubation Type: IV induction and Rapid sequence Laryngoscope Size: Mac and 3 Grade View: Grade II Tube type: Oral Tube size: 7.0 mm Number of attempts: 1 Airway Equipment and Method: Stylet Placement Confirmation: ETT inserted through vocal cords under direct vision,  breath sounds checked- equal and bilateral and positive ETCO2 Secured at: 21 cm Tube secured with: Tape Dental Injury: Teeth and Oropharynx as per pre-operative assessment

## 2012-12-09 NOTE — ED Notes (Signed)
Pt presents to department for evaluation of RLQ abdominal pain and N/V. Onset Tuesday night. 10/10 pain upon arrival. Denies urinary symptoms. LMP unknown, states she could be pregnant. Pt yelling and moaning upon arrival.

## 2012-12-09 NOTE — ED Provider Notes (Signed)
Medical screening examination/treatment/procedure(s) were performed by non-physician practitioner and as supervising physician I was immediately available for consultation/collaboration.  Patient's CT scan system with acute appendicitis. General surgery will evaluate.  Shelda Jakes, MD 12/09/12 1754

## 2012-12-09 NOTE — ED Provider Notes (Signed)
Care assumed from Emma Helper, PA-C  Emma Chambers is a 22 y.o. female presents with RUQ abd and back pain with nausea vomiting and diarrhea beginning this morning about 10 AM.   Patient with history of laparoscopic cholecystectomy in March or April of 2014.  Patient with a leukocytosis of 15.4.  Patient denies urinary or vaginal symptoms.  Plan: [place] and CT abd/pelvis pending.    5:44 PM Discussed CT results with Dr. Natasha Mead, radiology who believes the patient has an early appendicitis with enlargement to 1.1 cm and mild enhancement of the wall.  Appendix is retrocecal just in front of the L4 body, likely accounting for pt's atypical pain.  I personally reviewed the imaging tests through PACS system.  I reviewed available ER/hospitalization records through the EMR.  Patient is alert, oriented, vital signs stable.  Last oral intake 10 AM.  Will discuss with general surgery for admission and surgical removal.    Dierdre Forth, PA-C 12/09/12 1747

## 2012-12-09 NOTE — Anesthesia Preprocedure Evaluation (Addendum)
Anesthesia Evaluation  Patient identified by MRN, date of birth, ID band Patient awake    Reviewed: Allergy & Precautions, H&P , NPO status , Patient's Chart, lab work & pertinent test results  Airway Mallampati: II TM Distance: >3 FB Neck ROM: full    Dental  (+) Teeth Intact and Dental Advisory Given   Pulmonary          Cardiovascular     Neuro/Psych  Headaches,    GI/Hepatic   Endo/Other  Morbid obesity  Renal/GU      Musculoskeletal   Abdominal   Peds  Hematology   Anesthesia Other Findings   Reproductive/Obstetrics                        Anesthesia Physical Anesthesia Plan  ASA: II  Anesthesia Plan: General   Post-op Pain Management:    Induction: Intravenous  Airway Management Planned: Oral ETT  Additional Equipment:   Intra-op Plan:   Post-operative Plan: Extubation in OR  Informed Consent: I have reviewed the patients History and Physical, chart, labs and discussed the procedure including the risks, benefits and alternatives for the proposed anesthesia with the patient or authorized representative who has indicated his/her understanding and acceptance.     Plan Discussed with: CRNA, Anesthesiologist and Surgeon  Anesthesia Plan Comments:         Anesthesia Quick Evaluation

## 2012-12-09 NOTE — ED Notes (Signed)
Patient transported to Ultrasound 

## 2012-12-10 ENCOUNTER — Encounter (HOSPITAL_COMMUNITY): Payer: Self-pay | Admitting: General Practice

## 2012-12-10 MED ORDER — OXYCODONE-ACETAMINOPHEN 10-325 MG PO TABS: 1.0000 | ORAL_TABLET | Freq: Four times a day (QID) | ORAL | Status: DC | PRN

## 2012-12-10 MED ORDER — ONDANSETRON HCL 4 MG/2ML IJ SOLN: 4.0000 mg | Freq: Four times a day (QID) | INTRAMUSCULAR | Status: DC | PRN

## 2012-12-10 MED ORDER — ENOXAPARIN SODIUM 40 MG/0.4ML ~~LOC~~ SOLN
40.0000 mg | Freq: Every day | SUBCUTANEOUS | Status: DC
  Filled 2012-12-10: qty 0.4

## 2012-12-10 MED ORDER — MORPHINE SULFATE 2 MG/ML IJ SOLN
2.0000 mg | INTRAMUSCULAR | Status: DC | PRN
  Administered 2012-12-10 (×2): 2 mg via INTRAVENOUS
  Filled 2012-12-10 (×2): qty 1

## 2012-12-10 MED ORDER — POTASSIUM CHLORIDE IN NACL 20-0.9 MEQ/L-% IV SOLN
INTRAVENOUS | Status: DC
  Administered 2012-12-10: 02:00:00 via INTRAVENOUS
  Filled 2012-12-10 (×2): qty 1000

## 2012-12-10 MED ORDER — HYDROCODONE-ACETAMINOPHEN 5-325 MG PO TABS
1.0000 | ORAL_TABLET | ORAL | Status: DC | PRN
  Administered 2012-12-10: 1 via ORAL
  Administered 2012-12-10 (×2): 2 via ORAL
  Filled 2012-12-10: qty 1
  Filled 2012-12-10 (×2): qty 2

## 2012-12-10 MED ORDER — ONDANSETRON HCL 4 MG PO TABS: 4.0000 mg | ORAL_TABLET | Freq: Four times a day (QID) | ORAL | Status: DC | PRN

## 2012-12-10 NOTE — Progress Notes (Signed)
Discharged per w/c accompanied by nurse tech. Mother to drive pt home via private vehicle. Pt has all personal belongings, aware of when to call MD post surgical exit care notes for appy given, and script for pain pills.

## 2012-12-10 NOTE — ED Provider Notes (Signed)
Medical screening examination/treatment/procedure(s) were performed by non-physician practitioner and as supervising physician I was immediately available for consultation/collaboration.   Loren Racer, MD 12/10/12 1247

## 2012-12-10 NOTE — Discharge Summary (Signed)
Physician Discharge Summary  Patient ID: Emma Chambers MRN: [id number] DOB/AGE: Sep 05, 1990 22 y.o.  Admit date: 12/09/2012 Discharge date: 12/10/2012  Admitting Diagnosis: Back and abdominal pain  Discharge Diagnosis Patient Active Problem List   Diagnosis Date Noted  . 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 Preterm labor without delivery in third trimester 06/12/2012  . Anemia complicating pregnancy in third trimester 06/12/2012  . Cholelithiasis complicating pregnancy, antepartum 05/31/2012  . Pregnancy with generalized abdominal pain, antepartum 05/30/2012  . Encounter for insertion of mirena IUD 08/02/2011  . Generalized headaches 03/27/2011  . H. pylori infection 02/13/2011    Consultants none  Imaging: US Abdomen Complete  12/09/2012   CLINICAL DATA:  Abdominal pain  EXAM: ABDOMEN ULTRASOUND  COMPARISON:  The May 31, 2012  FINDINGS: Gallbladder  Surgically absent.  Common bile duct  Diameter: 9 mm, mildly prominent but probably within normal limits given post cholecystectomy state. The distal common bile duct is obscured by gas. There is no intrahepatic duct dilatation.  Liver  There is fatty change in the liver. No focal liver lesions are identified.  IVC  No abnormality visualized.  Pancreas  Visualized portion unremarkable. The portions of pancreas obscured by gas.  Spleen  Size and appearance within normal limits.  Right Kidney  Length: 11.5 cm. Echogenicity within normal limits. No mass or hydronephrosis visualized.  Left Kidney  Length: 12.2 cm. Echogenicity within normal limits. No mass or hydronephrosis visualized.  Abdominal aorta  No aneurysm visualized.  IMPRESSION: Portions of the pancreas and common bile duct are obscured by gas. Proximal common bile duct is mildly prominent but probably within normal limits given post cholecystectomy state. If there remains concern for potential biliary duct obstruction, MRCP could be helpful to further evaluate. The visualized portions of  the pancreas appear normal.  There is fatty liver. While no focal liver lesions are identified, it must be cautioned that the sensitivity of ultrasound for more subtle liver lesions is diminished given underlying fatty change.  The gallbladder is absent.  Study is otherwise unremarkable.   Electronically Signed   By: Bretta Bang   On: 12/09/2012 16:43   Ct Abdomen Pelvis W Contrast  12/09/2012   ADDENDUM REPORT: 12/09/2012 17:36  ADDENDUM: The appendix is in front of L4-L5 disc space. Seen in sagittal image 50.   Electronically Signed   By: Natasha Mead   On: 12/09/2012 17:36   12/09/2012   CLINICAL DATA:  Right side abdominal pain, nausea and vomiting  EXAM: CT ABDOMEN AND PELVIS WITH CONTRAST  TECHNIQUE: Multidetector CT imaging of the abdomen and pelvis was performed using the standard protocol following bolus administration of intravenous contrast.  CONTRAST:  OMNIPAQUE IOHEXOL 300 MG/ML  SOLN  COMPARISON:  None.  FINDINGS: Lung bases are unremarkable. Small amount of air noted in distal esophagus.  Sagittal images of the spine are unremarkable.  There are streaky artifacts from patient's large body habitus. The patient is status postcholecystectomy. No aortic aneurysm. CBD measures 8.5 mm in diameter. Enhanced liver is unremarkable. The pancreas spleen and adrenal glands are unremarkable. Kidneys are symmetrical in size and enhancement. No focal renal mass.  No hydronephrosis or hydroureter.  No small bowel obstruction. No ascites or free air. No adenopathy. Small nonspecific lymph nodes are noted in right lower quadrant mesentery with the largest measures 10 x 10 mm.  A low lying cecum is noted.  IUD noted.  In axial image 58 there is mild thickening of the appendix mild  enhancement of the wall and minimal stranding of surrounding fat. The appendix measures 1.1 cm in diameter. The appendix is retrocecal just in front of L4 vertebral body. Findings are highly suspicious for early appendicitis.  Clinical correlation is necessary. No adnexal masses noted. Urinary bladder is unremarkable. No inguinal adenopathy.  IMPRESSION: 1. In axial image 58 there is mild thickening of the appendix mild enhancement of the wall and minimal stranding of surrounding fat. The appendix measures 1.1 cm in diameter. The appendix is retrocecal just in front of L4 vertebral body. Findings are highly suspicious for early appendicitis. Clinical correlation is necessary. 2. Status postcholecystectomy. 3. There is a low lying cecum. 4. No small bowel obstruction. 5. IUD in place.  Electronically Signed: By: Natasha Mead On: 12/09/2012 17:22    Procedures laparoscopic appendectomy(Dr. Lindie Spruce 12/09/12)  Hospital Course:  Ronie Spies who presented to [facility] with back and abdominal pain, poor appetite, n/v for 1 days.  Workup showed possible early appendicitis.   Dr. Lindie Spruce was unimpressed with CT findings but because she had leukocytosis, RLQ pain and CT findings surgery was done.  Patient was admitted and underwent procedure listed above.  Tolerated procedure well and was transferred to the floor.  Diet was advanced as tolerated.  On POD #1 she had mild pain, states it was much improved than prior to surgery, the patient was voiding well, tolerating diet, ambulating well, pain well controlled, vital signs stable, incisions c/d/i and felt stable for discharge home.  Patient will follow up in our office in 3 weeks and knows to call with questions or concerns. We discussed warning signs that warrant immediate attention.  She is low risk for abscess formation, but was advised to call should she have worsening symptoms in 5=7 days.  I urged her to follow up with a PCP for work up should this pain persist.  She does not have a pcp, but will establish.  We discussed medication risks and benefits.  She may return to work in 1 week.    Physical Exam: General:  Alert, NAD, pleasant, comfortable Abd:  Soft, ND, mild tenderness, incisions  C/D/I    Medication List    STOP taking these medications       HYDROcodone-acetaminophen 5-325 MG per tablet  Commonly known as:  NORCO/VICODIN      TAKE these medications       naproxen 500 MG tablet  Commonly known as:  NAPROSYN  Take 1 tablet (500 mg total) by mouth 2 (two) times daily.     oxyCODONE-acetaminophen 10-325 MG per tablet  Commonly known as:  PERCOCET  Take 1 tablet by mouth every 6 (six) hours as needed for pain.         Follow-up Information   Follow up with [facility] On 01/05/2013. (arrive no later than 1pm for 1:30 appt.  post op check with advance practice provider)    Contact information:   [address]   [place] [address] [phone]       Signed: Ashok Norris, [facility] [phone]  12/10/2012, 10:36 AM

## 2012-12-10 NOTE — Progress Notes (Signed)
Utilization Review Completed.   Chantele Corado, RN, BSN Nurse Case Manager  [phone]  

## 2012-12-10 NOTE — Discharge Summary (Signed)
Discussed with PA  Pegge Cumberledge M. Marquist Binstock, MD, FACS General, Bariatric, & Minimally Invasive Surgery [facility]  

## 2012-12-10 NOTE — Progress Notes (Signed)
Went over all discharge instructions. Pt aware of when to call DR and follow up appts. Has script for pain meds.

## 2012-12-10 NOTE — Progress Notes (Signed)
Pt thought she had asked NP for a return to work note. None found call to MD office for a note.

## 2012-12-15 ENCOUNTER — Encounter (INDEPENDENT_AMBULATORY_CARE_PROVIDER_SITE_OTHER): Payer: Self-pay

## 2013-01-05 ENCOUNTER — Encounter (INDEPENDENT_AMBULATORY_CARE_PROVIDER_SITE_OTHER): Payer: Self-pay | Admitting: General Surgery

## 2013-02-14 ENCOUNTER — Encounter (HOSPITAL_COMMUNITY): Payer: Self-pay | Admitting: Emergency Medicine

## 2013-02-14 ENCOUNTER — Emergency Department (HOSPITAL_COMMUNITY): Payer: Self-pay

## 2013-02-14 ENCOUNTER — Emergency Department (HOSPITAL_COMMUNITY)
Admission: EM | Admit: 2013-02-14 | Discharge: 2013-02-14 | Disposition: A | Discharge status: Home / Self Care | Payer: Self-pay | Attending: Emergency Medicine | Admitting: Emergency Medicine

## 2013-02-14 DIAGNOSIS — Z79899 Other long term (current) drug therapy: Secondary | ICD-10-CM | POA: Insufficient documentation

## 2013-02-14 DIAGNOSIS — R202 Paresthesia of skin: Secondary | ICD-10-CM

## 2013-02-14 DIAGNOSIS — R5381 Other malaise: Secondary | ICD-10-CM | POA: Insufficient documentation

## 2013-02-14 DIAGNOSIS — R079 Chest pain, unspecified: Secondary | ICD-10-CM

## 2013-02-14 DIAGNOSIS — R42 Dizziness and giddiness: Secondary | ICD-10-CM | POA: Insufficient documentation

## 2013-02-14 DIAGNOSIS — R0789 Other chest pain: Secondary | ICD-10-CM | POA: Insufficient documentation

## 2013-02-14 DIAGNOSIS — H53149 Visual discomfort, unspecified: Secondary | ICD-10-CM | POA: Insufficient documentation

## 2013-02-14 DIAGNOSIS — R209 Unspecified disturbances of skin sensation: Secondary | ICD-10-CM | POA: Insufficient documentation

## 2013-02-14 DIAGNOSIS — R519 Headache, unspecified: Secondary | ICD-10-CM

## 2013-02-14 DIAGNOSIS — Z3202 Encounter for pregnancy test, result negative: Secondary | ICD-10-CM | POA: Insufficient documentation

## 2013-02-14 LAB — URINALYSIS W MICROSCOPIC + REFLEX CULTURE
Bilirubin Urine: NEGATIVE
Glucose, UA: NEGATIVE mg/dL
Hgb urine dipstick: NEGATIVE
Ketones, ur: NEGATIVE mg/dL
Nitrite: NEGATIVE
Protein, ur: NEGATIVE mg/dL
Specific Gravity, Urine: 1.025 (ref 1.005–1.030)
Urobilinogen, UA: 1 mg/dL (ref 0.0–1.0)
pH: 7 (ref 5.0–8.0)

## 2013-02-14 LAB — BASIC METABOLIC PANEL
BUN: 8 mg/dL (ref 6–23)
CO2: 26 mEq/L (ref 19–32)
Calcium: 9.3 mg/dL (ref 8.4–10.5)
Chloride: 102 mEq/L (ref 96–112)
Creatinine, Ser: 0.56 mg/dL (ref 0.50–1.10)
GFR calc Af Amer: >90 mL/min (ref >90)
GFR calc non Af Amer: >90 mL/min (ref >90)
Glucose, Bld: 76 mg/dL (ref 70–99)
Potassium: 3.5 mEq/L (ref 3.5–5.1)
Sodium: 139 mEq/L (ref 135–145)

## 2013-02-14 LAB — CBC
HCT: 38.2 % (ref 36.0–46.0)
Hemoglobin: 12.2 g/dL (ref 12.0–15.0)
MCH: 24.4 pg — ABNORMAL LOW (ref 26.0–34.0)
MCHC: 31.9 g/dL (ref 30.0–36.0)
MCV: 76.6 fL — ABNORMAL LOW (ref 78.0–100.0)
Platelets: 355 10*3/uL (ref 150–400)
RBC: 4.99 MIL/uL (ref 3.87–5.11)
RDW: 16.6 % — ABNORMAL HIGH (ref 11.5–15.5)
WBC: 8.7 10*3/uL (ref 4.0–10.5)

## 2013-02-14 LAB — POCT I-STAT TROPONIN I: Troponin i, poc: 0 ng/mL (ref 0.00–0.08)

## 2013-02-14 LAB — POCT PREGNANCY, URINE: Preg Test, Ur: NEGATIVE

## 2013-02-14 MED ORDER — DROPERIDOL 2.5 MG/ML IJ SOLN
2.5000 mg | Freq: Once | INTRAMUSCULAR | Status: DC
  Filled 2013-02-14: qty 1

## 2013-02-14 MED ORDER — ONDANSETRON 4 MG PO TBDP
8.0000 mg | ORAL_TABLET | Freq: Once | ORAL | Status: AC
  Administered 2013-02-14: 8 mg via ORAL
  Filled 2013-02-14: qty 2

## 2013-02-14 MED ORDER — LORAZEPAM 1 MG PO TABS
1.0000 mg | ORAL_TABLET | Freq: Once | ORAL | Status: AC
  Administered 2013-02-14: 1 mg via ORAL
  Filled 2013-02-14: qty 1

## 2013-02-14 MED ORDER — KETOROLAC TROMETHAMINE 30 MG/ML IJ SOLN: 30.0000 mg | Freq: Once | INTRAMUSCULAR | Status: DC

## 2013-02-14 MED ORDER — DIPHENHYDRAMINE HCL 50 MG/ML IJ SOLN: 12.5000 mg | Freq: Once | INTRAMUSCULAR | Status: DC

## 2013-02-14 MED ORDER — DIPHENHYDRAMINE HCL 50 MG/ML IJ SOLN
25.0000 mg | Freq: Once | INTRAMUSCULAR | Status: AC
  Administered 2013-02-14: 25 mg via INTRAMUSCULAR
  Filled 2013-02-14: qty 1

## 2013-02-14 MED ORDER — METOCLOPRAMIDE HCL 5 MG/ML IJ SOLN
10.0000 mg | Freq: Once | INTRAMUSCULAR | Status: AC
  Administered 2013-02-14: 10 mg via INTRAMUSCULAR
  Filled 2013-02-14: qty 2

## 2013-02-14 MED ORDER — KETOROLAC TROMETHAMINE 60 MG/2ML IM SOLN
60.0000 mg | Freq: Once | INTRAMUSCULAR | Status: AC
  Administered 2013-02-14: 60 mg via INTRAMUSCULAR
  Filled 2013-02-14: qty 2

## 2013-02-14 MED ORDER — SODIUM CHLORIDE 0.9 % IV BOLUS (SEPSIS): 1000.0000 mL | Freq: Once | INTRAVENOUS | Status: DC

## 2013-02-14 MED ORDER — METOCLOPRAMIDE HCL 5 MG/ML IJ SOLN: 10.0000 mg | Freq: Once | INTRAMUSCULAR | Status: DC

## 2013-02-14 NOTE — ED Notes (Signed)
Pt complains of dizziness, nausea, and vomiting

## 2013-02-14 NOTE — ED Provider Notes (Signed)
CSN: [id number]     Arrival date & time 02/14/13  1055 History   First MD Initiated Contact with Patient 02/14/13 1128     Chief Complaint  Patient presents with  . Chest Pain   (Consider location/radiation/quality/duration/timing/severity/associated sxs/prior Treatment) HPI Emma Chambers is a 22 y.o. female who presents emergency department with multiple complaints. Patient states that for the last 3 days she has had severe headache, sensitivity to light and with no wheezes, nausea, vomiting, chest pain, right sided weakness and numbness. States she has been working a lot. 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 States she has been working over the holidays and states they have been really busy. She reports feeling fatigued. States had a red bull 3 days ago and feels like right after that is when her symptoms began. She reports having another red bull 2 days ago and states she shortly after began sweating and feeling dizzy and they sent her home from work. Patient states currently she feels like her right side is "dead." States she feels left side is "hyper." Patient states she took ibuprofen 2 days ago for her headache which did not help and she has not taken any more medications. Patient denies any abdominal pain or back pain. She denies any urinary symptoms. She denies being pregnant. She denies any injuries to the head, neck, back. She denies any prior similar symptoms however she does have history of migraines but no associated neuro deficits.  Past Surgical History  Procedure Laterality Date  . Cholecystectomy N/A 07/27/2012    Procedure: LAPAROSCOPIC CHOLECYSTECTOMY WITH INTRAOPERATIVE CHOLANGIOGRAM;  Surgeon: Lodema Pilot, DO;  Location: [facility];  Service: General;  Laterality: N/A;  . Appendectomy    . Laparoscopic appendectomy N/A 12/09/2012    Procedure: APPENDECTOMY LAPAROSCOPIC;  Surgeon: Cherylynn Ridges, MD;  Location: [facility] OR;   Service: General;  Laterality: N/A;   Family History  Problem Relation Age of Onset  . Asthma Mother   . Hypertension Mother   . Parkinsonism Mother   . Diabetes Maternal Grandmother   . Hypertension Maternal Grandmother   . Stroke Maternal Grandmother   . Anesthesia problems Neg Hx   . Malignant hyperthermia Neg Hx   . Pseudochol deficiency Neg Hx   . Hypotension Neg Hx   . Asthma Brother    History  Substance Use Topics  . Smoking status: Never Smoker   . Smokeless tobacco: Never Used  . Alcohol Use: 0.0 oz/week     Comment: social   OB History   Grav Para Term Preterm Abortions TAB SAB Ect Mult Living   2 2 1 1  0 0 0 0 0 2     Review of Systems  Constitutional: Negative for fever and chills.  Eyes: Positive for photophobia. Negative for pain and redness.  Respiratory: Negative for cough, chest tightness and shortness of breath.   Cardiovascular: Negative for chest pain, palpitations and leg swelling.  Gastrointestinal: Negative for nausea, vomiting, abdominal pain and diarrhea.  Genitourinary: Negative for dysuria, flank pain, vaginal bleeding, vaginal discharge, vaginal pain and pelvic pain.  Musculoskeletal: Negative for arthralgias, myalgias, neck pain and neck stiffness.  Skin: Negative for rash.  Neurological: Positive for dizziness, weakness, numbness and headaches.  All other systems reviewed and are negative.    Allergies  Ambien; Amoxicillin; and Dilaudid  Home Medications   Current Outpatient Rx  Name  Route  Sig  Dispense  Refill  .  levonorgestrel (MIRENA) 20 MCG/24HR IUD   Intrauterine   1 each by Intrauterine route once.          BP 110/74  Pulse 79  Temp(Src) 97.9 F (36.6 C) (Oral)  Resp 18  Wt 228 lb 11.2 oz (103.738 kg)  SpO2 100% Physical Exam  Nursing note and vitals reviewed. Constitutional: She is oriented to person, place, and time. She appears well-developed and well-nourished. No distress.  HENT:  Head: Normocephalic.   Eyes: Conjunctivae and EOM are normal. Pupils are equal, round, and reactive to light.  Neck: Neck supple.  Cardiovascular: Normal rate, regular rhythm and normal heart sounds.   Pulmonary/Chest: Effort normal and breath sounds normal. No respiratory distress. She has no wheezes. She has no rales.  Abdominal: Soft. Bowel sounds are normal. She exhibits no distension. There is no tenderness. There is no rebound.  Musculoskeletal: She exhibits no edema.  Neurological: She is alert and oriented to person, place, and time.  5/5 and equal upper and lower extremity strength bilaterally. Equal grip strength bilaterally. Normal finger to nose and heel to shin. No pronator drift. Patellar reflexes 2+ decreased sensation to dorsal right forearm and hand compared to the left.    Skin: Skin is warm and dry.  Psychiatric: She has a normal mood and affect. Her behavior is normal.    ED Course  Procedures (including critical care time) Labs Review Labs Reviewed  CBC - Abnormal; Notable for the following:    MCV 76.6 (*)    MCH 24.4 (*)    RDW 16.6 (*)    All other components within normal limits  URINALYSIS W MICROSCOPIC + REFLEX CULTURE - Abnormal; Notable for the following:    Leukocytes, UA SMALL (*)    Bacteria, UA FEW (*)    Squamous Epithelial / LPF MANY (*)    All other components within normal limits  URINE CULTURE  BASIC METABOLIC PANEL  POCT I-STAT TROPONIN I  POCT PREGNANCY, URINE   Imaging Review Dg Chest 2 View  02/14/2013   CLINICAL DATA:  Chest pain  EXAM: CHEST - 2 VIEW  COMPARISON:  08/31/2012  FINDINGS: The heart size and mediastinal contours are within normal limits. Both lungs are clear. The visualized skeletal structures are unremarkable. No effusion. Surgical clips in the upper abdomen. Abdomen was shielded.  IMPRESSION: No acute cardiopulmonary disease.   Electronically Signed   By: Oley Balm M.D.   On: 02/14/2013 14:19    EKG Interpretation     Date/Time:  Sunday February 14 2013 11:01:30 EST Ventricular Rate:  90 PR Interval:  148 QRS Duration: 72 QT Interval:  350 QTC Calculation: 428 R Axis:   54 Text Interpretation:  Normal sinus rhythm Nonspecific T wave abnormality Abnormal ECG Sinus rhythm T wave abnormality Abnormal ekg Confirmed by Gerhard Munch  MD ([id number]) on 02/14/2013 1:22:33 PM            MDM   1. Chest pain   2. Paresthesias   3. Headache     Patient with multiple oral nonspecific complaints. She's complaining of a headache, right-sided numbness and weakness, chest pain, fatigue. She does have a 43-month-old baby at home and has been working every day long shift at [employer] and states she feels stressed. Her exam is unremarkable except for some decreased sensation according to her and her right hand and forearm. She does have equal strength of her extremities bilaterally. Her neurological exam is normal otherwise. Her lungs are clear. Her  vital signs are normal and she is PERC negative. I suspect her numbness or weakness could be related to an atypical migraine. She states she does have history of migraines in the past. I have treated her migraine with Toradol, Reglan, Benadryl IM. She had no relief with these medications. I tried to give her droperidol by the hospital is out of this medication. I did give her an Ativan and Zofran which has helped mildly. At this time patient is in no acute distress. She'll be discharged home with ibuprofen or Tylenol for her headaches. She will followup with primary care doctor.   Filed Vitals:   02/14/13 1059 02/14/13 1145  BP: 141/90 110/74  Pulse: 97 79  Temp: 97.9 F (36.6 C)   TempSrc: Oral   Resp: 18   Weight: 228 lb 11.2 oz (103.738 kg)   SpO2: 100% 100%       Lottie Mussel, PA-C 02/14/13 1453

## 2013-02-14 NOTE — ED Provider Notes (Signed)
  Medical screening examination/treatment/procedure(s) were performed by non-physician practitioner and as supervising physician I was immediately available for consultation/collaboration.  EKG Interpretation    Date/Time:  Sunday February 14 2013 11:01:30 EST Ventricular Rate:  90 PR Interval:  148 QRS Duration: 72 QT Interval:  350 QTC Calculation: 428 R Axis:   54 Text Interpretation:  Normal sinus rhythm Nonspecific T wave abnormality Abnormal ECG Sinus rhythm T wave abnormality Abnormal ekg Confirmed by Gerhard Munch  MD ([id number]) on 02/14/2013 1:22:33 PM               Gerhard Munch, MD 02/14/13 1191

## 2013-02-14 NOTE — ED Notes (Signed)
Pt reports chest pain, abd pain, intermittent numbness to R side of body, and "feeling fatigued" for past several week.s A&Ox4, resp e/u

## 2013-02-14 NOTE — ED Notes (Signed)
PA at bedside.

## 2013-02-15 LAB — URINE CULTURE: Colony Count: 35000

## 2013-05-05 ENCOUNTER — Encounter (HOSPITAL_COMMUNITY): Payer: Self-pay | Admitting: Emergency Medicine

## 2013-05-05 ENCOUNTER — Emergency Department (HOSPITAL_COMMUNITY): Payer: Medicaid Other

## 2013-05-05 ENCOUNTER — Emergency Department (HOSPITAL_COMMUNITY)
Admission: EM | Admit: 2013-05-05 | Discharge: 2013-05-05 | Disposition: A | Discharge status: Home / Self Care | Payer: Self-pay | Attending: Emergency Medicine | Admitting: Emergency Medicine

## 2013-05-05 DIAGNOSIS — I10 Essential (primary) hypertension: Secondary | ICD-10-CM | POA: Insufficient documentation

## 2013-05-05 DIAGNOSIS — Z87440 Personal history of urinary (tract) infections: Secondary | ICD-10-CM | POA: Insufficient documentation

## 2013-05-05 DIAGNOSIS — Z3202 Encounter for pregnancy test, result negative: Secondary | ICD-10-CM | POA: Insufficient documentation

## 2013-05-05 DIAGNOSIS — B9789 Other viral agents as the cause of diseases classified elsewhere: Secondary | ICD-10-CM | POA: Insufficient documentation

## 2013-05-05 DIAGNOSIS — Z8719 Personal history of other diseases of the digestive system: Secondary | ICD-10-CM | POA: Insufficient documentation

## 2013-05-05 DIAGNOSIS — B349 Viral infection, unspecified: Secondary | ICD-10-CM

## 2013-05-05 DIAGNOSIS — R112 Nausea with vomiting, unspecified: Secondary | ICD-10-CM | POA: Insufficient documentation

## 2013-05-05 DIAGNOSIS — R109 Unspecified abdominal pain: Secondary | ICD-10-CM | POA: Insufficient documentation

## 2013-05-05 DIAGNOSIS — R000 Tachycardia, unspecified: Secondary | ICD-10-CM | POA: Insufficient documentation

## 2013-05-05 LAB — URINE MICROSCOPIC-ADD ON

## 2013-05-05 LAB — CBC
HCT: 38.2 % (ref 36.0–46.0)
Hemoglobin: 12.2 g/dL (ref 12.0–15.0)
MCH: 25.3 pg — ABNORMAL LOW (ref 26.0–34.0)
MCHC: 31.9 g/dL (ref 30.0–36.0)
MCV: 79.3 fL (ref 78.0–100.0)
Platelets: 363 10*3/uL (ref 150–400)
RBC: 4.82 MIL/uL (ref 3.87–5.11)
RDW: 15.8 % — ABNORMAL HIGH (ref 11.5–15.5)
WBC: 7.1 10*3/uL (ref 4.0–10.5)

## 2013-05-05 LAB — POCT I-STAT, CHEM 8
BUN: 8 mg/dL (ref 6–23)
Calcium, Ion: 1.22 mmol/L (ref 1.12–1.23)
Chloride: 106 mEq/L (ref 96–112)
Creatinine, Ser: 0.8 mg/dL (ref 0.50–1.10)
Glucose, Bld: 90 mg/dL (ref 70–99)
HCT: 40 % (ref 36.0–46.0)
Hemoglobin: 13.6 g/dL (ref 12.0–15.0)
Potassium: 3.5 mEq/L — ABNORMAL LOW (ref 3.7–5.3)
Sodium: 142 mEq/L (ref 137–147)
TCO2: 23 mmol/L (ref 0–100)

## 2013-05-05 LAB — URINALYSIS, ROUTINE W REFLEX MICROSCOPIC
Bilirubin Urine: NEGATIVE
Glucose, UA: NEGATIVE mg/dL
Ketones, ur: 15 mg/dL — AB
Leukocytes, UA: NEGATIVE
Nitrite: NEGATIVE
Protein, ur: NEGATIVE mg/dL
Specific Gravity, Urine: 1.038 — ABNORMAL HIGH (ref 1.005–1.030)
Urobilinogen, UA: 1 mg/dL (ref 0.0–1.0)
pH: 5.5 (ref 5.0–8.0)

## 2013-05-05 LAB — POCT PREGNANCY, URINE: Preg Test, Ur: NEGATIVE

## 2013-05-05 MED ORDER — BENZONATATE 100 MG PO CAPS: 200.0000 mg | ORAL_CAPSULE | Freq: Two times a day (BID) | ORAL | Status: DC | PRN

## 2013-05-05 MED ORDER — SODIUM CHLORIDE 0.9 % IV BOLUS (SEPSIS)
1000.0000 mL | Freq: Once | INTRAVENOUS | Status: AC
  Administered 2013-05-05: 1000 mL via INTRAVENOUS

## 2013-05-05 MED ORDER — ONDANSETRON 4 MG PO TBDP: 4.0000 mg | ORAL_TABLET | Freq: Three times a day (TID) | ORAL | Status: DC | PRN

## 2013-05-05 MED ORDER — ONDANSETRON HCL 4 MG/2ML IJ SOLN
4.0000 mg | INTRAMUSCULAR | Status: AC
  Administered 2013-05-05: 4 mg via INTRAVENOUS
  Filled 2013-05-05: qty 2

## 2013-05-05 MED ORDER — IBUPROFEN 600 MG PO TABS: 600.0000 mg | ORAL_TABLET | Freq: Four times a day (QID) | ORAL | Status: DC | PRN

## 2013-05-05 NOTE — Discharge Instructions (Signed)
Recommend tessalon for cough and zofran for nausea/vomiting. Drink plenty of fluids and get plenty of rest. Take ibuprofen for body aches and fever as needed. Follow up with your primary doctor. Return if symptoms worsen.  Viral Infections A viral infection can be caused by different types of viruses.Most viral infections are not serious and resolve on their own. However, some infections may cause severe symptoms and may lead to further complications. SYMPTOMS Viruses can frequently cause:  Minor sore throat.  Aches and pains.  Headaches.  Runny nose.  Different types of rashes.  Watery eyes.  Tiredness.  Cough.  Loss of appetite.  Gastrointestinal infections, resulting in nausea, vomiting, and diarrhea. These symptoms do not respond to antibiotics because the infection is not caused by bacteria. However, you might catch a bacterial infection following the viral infection. This is sometimes called a "superinfection." Symptoms of such a bacterial infection may include:  Worsening sore throat with pus and difficulty swallowing.  Swollen neck glands.  Chills and a high or persistent fever.  Severe headache.  Tenderness over the sinuses.  Persistent overall ill feeling (malaise), muscle aches, and tiredness (fatigue).  Persistent cough.  Yellow, green, or brown mucus production with coughing. HOME CARE INSTRUCTIONS   Only take over-the-counter or prescription medicines for pain, discomfort, diarrhea, or fever as directed by your caregiver.  Drink enough water and fluids to keep your urine clear or pale yellow. Sports drinks can provide valuable electrolytes, sugars, and hydration.  Get plenty of rest and maintain proper nutrition. Soups and broths with crackers or rice are fine. SEEK IMMEDIATE MEDICAL CARE IF:   You have severe headaches, shortness of breath, chest pain, neck pain, or an unusual rash.  You have uncontrolled vomiting, diarrhea, or you are unable to  keep down fluids.  You or your child has an oral temperature above 102 F (38.9 C), not controlled by medicine.  Your baby is older than 3 months with a rectal temperature of 102 F (38.9 C) or higher.  Your baby is 81 months old or younger with a rectal temperature of 100.4 F (38 C) or higher. MAKE SURE YOU:   Understand these instructions.  Will watch your condition.  Will get help right away if you are not doing well or get worse. Document Released: 12/12/2004 Document Revised: 05/27/2011 Document Reviewed: 07/09/2010 [facility] Patient Information 2014 [place], [place].

## 2013-05-05 NOTE — ED Notes (Signed)
Pt c/o N/V and body aches with HA x 2 days; pt sts some URI sx with cough also

## 2013-05-05 NOTE — ED Provider Notes (Signed)
CSN: [id number]     Arrival date & time 05/05/13  1055 History   First MD Initiated Contact with Patient 05/05/13 1106     Chief Complaint  Patient presents with  . Emesis  . URI  . Generalized Body Aches     (Consider location/radiation/quality/duration/timing/severity/associated sxs/prior Treatment) HPI Comments: Patient is a 23 year old female who presents to the emergency department for upper respiratory symptoms. Patient states that symptoms have been ongoing x2 days and include nasal congestion and dry nonproductive cough. Patient endorses associated myalgias, intermittent fever, intermittent nausea, and nonbloody/nonbilious emesis x2 days. Patient has been using Tylenol with Codeine and Tylenol sinus as well as Vicks Vaporub without relief of symptoms. She denies any aggravating factors of her symptoms or sick contacts. Patient also states she has been experiencing some intermittent lower abdominal discomfort intermittently. She denies associated hemoptysis, syncope, inability to swallow/drooling, shortness of breath, diarrhea, dysuria, melena, hematochezia, hematuria, vaginal bleeding/discharge and numbness/weakness. Abdominal surgical hx significant for appendectomy and cholecystectomy.  Patient is a 23 y.o. female presenting with vomiting and URI. The history is provided by the patient. No language interpreter was used.  Emesis Associated symptoms: abdominal pain and URI   Associated symptoms: no diarrhea   URI Presenting symptoms: congestion, cough, fatigue and fever   Presenting symptoms: no rhinorrhea     Past Medical History  Diagnosis Date  . UTI (lower urinary tract infection)   . Headache(784.0)   . Gallstones   . Hypertension     GHTN with last pg  . Pregnancy induced hypertension    Past Surgical History  Procedure Laterality Date  . Cholecystectomy N/A 07/27/2012    Procedure: LAPAROSCOPIC CHOLECYSTECTOMY WITH INTRAOPERATIVE CHOLANGIOGRAM;  Surgeon: Madilyn Hook,  DO;  Location: [facility];  Service: General;  Laterality: N/A;  . Appendectomy    . Laparoscopic appendectomy N/A 12/09/2012    Procedure: APPENDECTOMY LAPAROSCOPIC;  Surgeon: Gwenyth Ober, MD;  Location: [facility] OR;  Service: General;  Laterality: N/A;   Family History  Problem Relation Age of Onset  . Asthma Mother   . Hypertension Mother   . Parkinsonism Mother   . Diabetes Maternal Grandmother   . Hypertension Maternal Grandmother   . Stroke Maternal Grandmother   . Anesthesia problems Neg Hx   . Malignant hyperthermia Neg Hx   . Pseudochol deficiency Neg Hx   . Hypotension Neg Hx   . Asthma Brother    History  Substance Use Topics  . Smoking status: Never Smoker   . Smokeless tobacco: Never Used  . Alcohol Use: 0.0 oz/week     Comment: social   OB History   Grav Para Term Preterm Abortions TAB SAB Ect Mult Living   2 2 1 1  0 0 0 0 0 2     Review of Systems  Constitutional: Positive for fever and fatigue.  HENT: Positive for congestion. Negative for drooling, rhinorrhea and trouble swallowing.   Respiratory: Positive for cough. Negative for shortness of breath.   Cardiovascular: Negative for palpitations.  Gastrointestinal: Positive for nausea, vomiting and abdominal pain. Negative for diarrhea.  Genitourinary: Negative for dysuria, hematuria, vaginal bleeding and vaginal discharge.  Neurological: Negative for syncope, weakness and numbness.  All other systems reviewed and are negative.      Allergies  Ambien; Amoxicillin; and Dilaudid  Home Medications   Current Outpatient Rx  Name  Route  Sig  Dispense  Refill  . Camphor-Eucalyptus-Menthol (VICKS VAPORUB EX)   Apply externally   Apply 1  application topically at bedtime.         Marland Kitchen levonorgestrel (MIRENA) 20 MCG/24HR IUD   Intrauterine   1 each by Intrauterine route once.         Marland Kitchen Phenylephrine-Acetaminophen (TYLENOL SINUS CONGESTION/PAIN PO)   Oral   Take 1 tablet by mouth every 6 (six) hours as needed  (for cold symptoms).         . benzonatate (TESSALON) 100 MG capsule   Oral   Take 2 capsules (200 mg total) by mouth 2 (two) times daily as needed for cough.   20 capsule   0   . ibuprofen (ADVIL,MOTRIN) 600 MG tablet   Oral   Take 1 tablet (600 mg total) by mouth every 6 (six) hours as needed.   30 tablet   0   . ondansetron (ZOFRAN ODT) 4 MG disintegrating tablet   Oral   Take 1 tablet (4 mg total) by mouth every 8 (eight) hours as needed for nausea or vomiting.   10 tablet   0    BP 108/54  Pulse 90  Temp(Src) 98.7 F (37.1 C) (Oral)  Resp 20  SpO2 100%  Physical Exam  Nursing note and vitals reviewed. Constitutional: She is oriented to person, place, and time. She appears well-developed and well-nourished. No distress.  Patient well and nontoxic appearing. She is in no acute distress.  HENT:  Head: Normocephalic and atraumatic.  Mouth/Throat: Oropharynx is clear and moist. No oropharyngeal exudate.  +nasal congestion  Eyes: Conjunctivae and EOM are normal. Pupils are equal, round, and reactive to light. No scleral icterus.  Neck: Normal range of motion. Neck supple.  Cardiovascular: Regular rhythm and normal heart sounds.  Tachycardia present.   Tachy to 104  Pulmonary/Chest: Effort normal and breath sounds normal. No respiratory distress. She has no wheezes. She has no rales.  Abdominal: Soft. There is tenderness (mild lower abdominal b/l). There is no rebound and no guarding.  Musculoskeletal: Normal range of motion.  Neurological: She is alert and oriented to person, place, and time.  Skin: Skin is warm and dry. No rash noted. She is not diaphoretic. No erythema. No pallor.  Psychiatric: She has a normal mood and affect. Her behavior is normal.    ED Course  Procedures (including critical care time) Labs Review Labs Reviewed  CBC - Abnormal; Notable for the following:    MCH 25.3 (*)    RDW 15.8 (*)    All other components within normal limits   URINALYSIS, ROUTINE W REFLEX MICROSCOPIC - Abnormal; Notable for the following:    APPearance CLOUDY (*)    Specific Gravity, Urine 1.038 (*)    Hgb urine dipstick TRACE (*)    Ketones, ur 15 (*)    All other components within normal limits  URINE MICROSCOPIC-ADD ON - Abnormal; Notable for the following:    Squamous Epithelial / LPF MANY (*)    Bacteria, UA FEW (*)    Crystals CA OXALATE CRYSTALS (*)    All other components within normal limits  POCT I-STAT, CHEM 8 - Abnormal; Notable for the following:    Potassium 3.5 (*)    All other components within normal limits  POCT PREGNANCY, URINE   Imaging Review Dg Chest 2 View  05/05/2013   CLINICAL DATA:  Cough and chest congestion.  EXAM: CHEST  2 VIEW  COMPARISON:  DG CHEST 2 VIEW dated 02/14/2013  FINDINGS: The lungs are adequately inflated and clear. The cardiac silhouette is normal  in size. The pulmonary vascularity is not engorged. The mediastinum is normal in width. There is no pleural effusion. The observed portions of the bony thorax are normal.  IMPRESSION: No active cardiopulmonary disease.   Electronically Signed   By: David  Martinique   On: 05/05/2013 12:30    EKG Interpretation   None       MDM   Final diagnoses:  Viral illness   23 year old female presents to the emergency department for upper respiratory symptoms including congestion and cough with associated fever, myalgias, nausea, and emesis. Patient well and nontoxic appearing, hemodynamically stable, and afebrile on arrival. Physical examination significant for extremely mild tenderness with deep palpation to the lower abdomen b/l. She denies urinary symptoms and vaginal complaints.  Patient treated in the ED with IV fluids and Zofran with improvement. She has no leukocytosis, anemia, or electrolyte imbalance today. Urinalysis does not suggest infection. Urine pregnancy negative. Chest x-ray negative for focal consolidation or pneumonia; no other cardiopulmonary  processes identified on imaging.   Throughout ED course patient has had multiple serial abdominal examinations, all of which have been stable with little to no TTP. Patient has a hx of appendectomy and cholecystecomy; doubt SBO or pSBO given normal recent BMs. Doubt diverticulitis/colitis as patient without leukocytosis or diarrhea. Patient without vaginal c/o and b/l tenderness atypical for concerning emergent processes such as ovarian torsion. For these reasons, do not believe further emergent workup or imaging is indicated at this time. Symptoms today c/w viral illness. Patient tolerating POs in ED without emesis. She is stable and appropriate for d/c with Rx for tessalon, zofran, and ibuprofen. Rest and fluids as well as PCP f/u recommended. Return precautions provided and patient agreeable to plan with no unaddressed concerns.   Filed Vitals:   05/05/13 1244 05/05/13 1305 05/05/13 1330 05/05/13 1418  BP: 110/95 105/64 108/54 105/54  Pulse: 90 88 90 90  Temp: 98.7 F (37.1 C)   98.8 F (37.1 C)  TempSrc: Oral   Oral  Resp: 20     SpO2: 100% 100% 100% 99%       Antonietta Breach, PA-C 05/05/13 1423

## 2013-05-07 NOTE — ED Provider Notes (Signed)
Medical screening examination/treatment/procedure(s) were performed by non-physician practitioner and as supervising physician I was immediately available for consultation/collaboration.  EKG Interpretation   None         Mervin Kung, MD 05/07/13 [phone]

## 2013-06-21 ENCOUNTER — Encounter (HOSPITAL_COMMUNITY): Payer: Self-pay | Admitting: Emergency Medicine

## 2013-06-21 ENCOUNTER — Emergency Department (HOSPITAL_COMMUNITY)
Admission: EM | Admit: 2013-06-21 | Discharge: 2013-06-21 | Disposition: A | Discharge status: Home / Self Care | Payer: Self-pay | Attending: Emergency Medicine | Admitting: Emergency Medicine

## 2013-06-21 ENCOUNTER — Emergency Department (HOSPITAL_COMMUNITY): Payer: Medicaid Other

## 2013-06-21 DIAGNOSIS — Z8719 Personal history of other diseases of the digestive system: Secondary | ICD-10-CM | POA: Insufficient documentation

## 2013-06-21 DIAGNOSIS — R630 Anorexia: Secondary | ICD-10-CM | POA: Insufficient documentation

## 2013-06-21 DIAGNOSIS — Z87440 Personal history of urinary (tract) infections: Secondary | ICD-10-CM | POA: Insufficient documentation

## 2013-06-21 DIAGNOSIS — Z9089 Acquired absence of other organs: Secondary | ICD-10-CM | POA: Insufficient documentation

## 2013-06-21 DIAGNOSIS — Z3202 Encounter for pregnancy test, result negative: Secondary | ICD-10-CM | POA: Insufficient documentation

## 2013-06-21 DIAGNOSIS — T50995A Adverse effect of other drugs, medicaments and biological substances, initial encounter: Secondary | ICD-10-CM | POA: Insufficient documentation

## 2013-06-21 DIAGNOSIS — N83209 Unspecified ovarian cyst, unspecified side: Secondary | ICD-10-CM

## 2013-06-21 DIAGNOSIS — T508X5A Adverse effect of diagnostic agents, initial encounter: Secondary | ICD-10-CM

## 2013-06-21 DIAGNOSIS — Z880 Allergy status to penicillin: Secondary | ICD-10-CM | POA: Insufficient documentation

## 2013-06-21 DIAGNOSIS — R109 Unspecified abdominal pain: Secondary | ICD-10-CM

## 2013-06-21 DIAGNOSIS — R1013 Epigastric pain: Secondary | ICD-10-CM | POA: Insufficient documentation

## 2013-06-21 LAB — CBC WITH DIFFERENTIAL/PLATELET
Basophils Absolute: 0 K/uL (ref 0.0–0.1)
Basophils Relative: 0 % (ref 0–1)
Eosinophils Absolute: 0.4 K/uL (ref 0.0–0.7)
Eosinophils Relative: 4 % (ref 0–5)
HCT: 37.5 % (ref 36.0–46.0)
Hemoglobin: 12.3 g/dL (ref 12.0–15.0)
Lymphocytes Relative: 26 % (ref 12–46)
Lymphs Abs: 2.5 K/uL (ref 0.7–4.0)
MCH: 26.6 pg (ref 26.0–34.0)
MCHC: 32.8 g/dL (ref 30.0–36.0)
MCV: 81.2 fL (ref 78.0–100.0)
Monocytes Absolute: 0.6 K/uL (ref 0.1–1.0)
Monocytes Relative: 6 % (ref 3–12)
Neutro Abs: 6.3 K/uL (ref 1.7–7.7)
Neutrophils Relative %: 64 % (ref 43–77)
Platelets: 359 K/uL (ref 150–400)
RBC: 4.62 MIL/uL (ref 3.87–5.11)
RDW: 15.7 % — ABNORMAL HIGH (ref 11.5–15.5)
WBC: 9.8 K/uL (ref 4.0–10.5)

## 2013-06-21 LAB — COMPREHENSIVE METABOLIC PANEL
ALT: 8 U/L (ref 0–35)
AST: 13 U/L (ref 0–37)
Albumin: 4.1 g/dL (ref 3.5–5.2)
Alkaline Phosphatase: 99 U/L (ref 39–117)
BUN: 10 mg/dL (ref 6–23)
CO2: 24 mEq/L (ref 19–32)
Calcium: 9.5 mg/dL (ref 8.4–10.5)
Chloride: 105 mEq/L (ref 96–112)
Creatinine, Ser: 0.64 mg/dL (ref 0.50–1.10)
GFR calc Af Amer: >90 mL/min (ref >90)
GFR calc non Af Amer: >90 mL/min (ref >90)
Glucose, Bld: 86 mg/dL (ref 70–99)
Potassium: 4 mEq/L (ref 3.7–5.3)
Sodium: 143 mEq/L (ref 137–147)
Total Bilirubin: <0.2 mg/dL — ABNORMAL LOW (ref 0.3–1.2)
Total Protein: 7.9 g/dL (ref 6.0–8.3)

## 2013-06-21 LAB — LIPASE, BLOOD: Lipase: 26 U/L (ref 11–59)

## 2013-06-21 LAB — POC URINE PREG, ED: Preg Test, Ur: NEGATIVE

## 2013-06-21 LAB — URINALYSIS, ROUTINE W REFLEX MICROSCOPIC
Bilirubin Urine: NEGATIVE
Glucose, UA: NEGATIVE mg/dL
Hgb urine dipstick: NEGATIVE
Ketones, ur: NEGATIVE mg/dL
Leukocytes, UA: NEGATIVE
Nitrite: NEGATIVE
Protein, ur: NEGATIVE mg/dL
Specific Gravity, Urine: 1.028 (ref 1.005–1.030)
Urobilinogen, UA: 1 mg/dL (ref 0.0–1.0)
pH: 7 (ref 5.0–8.0)

## 2013-06-21 LAB — PREGNANCY, URINE: Preg Test, Ur: NEGATIVE

## 2013-06-21 MED ORDER — IOHEXOL 300 MG/ML  SOLN
100.0000 mL | Freq: Once | INTRAMUSCULAR | Status: AC | PRN
  Administered 2013-06-21: 100 mL via INTRAVENOUS

## 2013-06-21 MED ORDER — OMEPRAZOLE 20 MG PO CPDR: 20.0000 mg | DELAYED_RELEASE_CAPSULE | Freq: Every day | ORAL | Status: DC

## 2013-06-21 MED ORDER — ONDANSETRON HCL 4 MG/2ML IJ SOLN
4.0000 mg | Freq: Once | INTRAMUSCULAR | Status: AC
  Administered 2013-06-21: 4 mg via INTRAVENOUS
  Filled 2013-06-21: qty 2

## 2013-06-21 MED ORDER — OXYCODONE-ACETAMINOPHEN 5-325 MG PO TABS
1.0000 | ORAL_TABLET | Freq: Once | ORAL | Status: AC
  Administered 2013-06-21: 1 via ORAL
  Filled 2013-06-21: qty 1

## 2013-06-21 MED ORDER — DIPHENHYDRAMINE HCL 50 MG/ML IJ SOLN
25.0000 mg | Freq: Once | INTRAMUSCULAR | Status: AC
  Administered 2013-06-21: 25 mg via INTRAVENOUS

## 2013-06-21 MED ORDER — MORPHINE SULFATE 4 MG/ML IJ SOLN: 4.0000 mg | Freq: Once | INTRAMUSCULAR | Status: DC

## 2013-06-21 MED ORDER — IOHEXOL 300 MG/ML  SOLN
25.0000 mL | INTRAMUSCULAR | Status: AC
  Administered 2013-06-21: 25 mL via ORAL

## 2013-06-21 MED ORDER — SODIUM CHLORIDE 0.9 % IV SOLN
1000.0000 mL | Freq: Once | INTRAVENOUS | Status: AC
  Administered 2013-06-21: 1000 mL via INTRAVENOUS

## 2013-06-21 MED ORDER — SODIUM CHLORIDE 0.9 % IV SOLN
1000.0000 mL | INTRAVENOUS | Status: DC
  Administered 2013-06-21: 1000 mL via INTRAVENOUS

## 2013-06-21 MED ORDER — DIPHENHYDRAMINE HCL 50 MG/ML IJ SOLN
INTRAMUSCULAR | Status: AC
  Administered 2013-06-21: 25 mg via INTRAVENOUS
  Filled 2013-06-21: qty 1

## 2013-06-21 MED ORDER — ONDANSETRON 8 MG PO TBDP: 8.0000 mg | ORAL_TABLET | Freq: Three times a day (TID) | ORAL | Status: DC | PRN

## 2013-06-21 MED ORDER — MORPHINE SULFATE 4 MG/ML IJ SOLN
6.0000 mg | Freq: Once | INTRAMUSCULAR | Status: AC
  Administered 2013-06-21: 6 mg via INTRAVENOUS
  Filled 2013-06-21: qty 2

## 2013-06-21 MED ORDER — METHYLPREDNISOLONE SODIUM SUCC 125 MG IJ SOLR
80.0000 mg | Freq: Once | INTRAMUSCULAR | Status: AC
  Administered 2013-06-21: 80 mg via INTRAVENOUS
  Filled 2013-06-21: qty 2

## 2013-06-21 MED ORDER — OXYCODONE-ACETAMINOPHEN 5-325 MG PO TABS: 1.0000 | ORAL_TABLET | ORAL | Status: DC | PRN

## 2013-06-21 MED ORDER — DIPHENHYDRAMINE HCL 50 MG/ML IJ SOLN: 25.0000 mg | Freq: Once | INTRAMUSCULAR | Status: DC

## 2013-06-21 MED ORDER — PREDNISONE 20 MG PO TABS: 40.0000 mg | ORAL_TABLET | Freq: Every day | ORAL | Status: DC

## 2013-06-21 NOTE — ED Notes (Signed)
Pt states she has been having upper abd pain since Friday that moves to her back. States she hasn't had much of an appetite. States her last BM was 2 days ago which is normal for her. States she is tired of coming for her stomach and just wants to know what is bothering her. States she has had her gallbladder and her appendix removed.

## 2013-06-21 NOTE — ED Provider Notes (Signed)
CSN: [id number]     Arrival date & time 06/21/13  1200 History   First MD Initiated Contact with Patient 06/21/13 1339     Chief Complaint  Patient presents with  . Abdominal Pain      The history is provided by the patient.   Does report some upper abdominal pain that began several days ago and is constant in nature with at times as more sharp pain associated with the periods and her epigastric region and radiates to her bilateral upper abdominal quadrants underlying her breasts.  No rash.  Nausea without vomiting.  Mild decreased appetite.  She states this is been ongoing intermittently for some time.  She has had a prior cholecystectomy as well as a prior appendectomy.  No urinary complaints.   Past Medical History  Diagnosis Date  . UTI (lower urinary tract infection)   . Headache(784.0)   . Gallstones   . Hypertension     GHTN with last pg  . Pregnancy induced hypertension    Past Surgical History  Procedure Laterality Date  . Cholecystectomy N/A 07/27/2012    Procedure: LAPAROSCOPIC CHOLECYSTECTOMY WITH INTRAOPERATIVE CHOLANGIOGRAM;  Surgeon: Madilyn Hook, DO;  Location: [facility];  Service: General;  Laterality: N/A;  . Appendectomy    . Laparoscopic appendectomy N/A 12/09/2012    Procedure: APPENDECTOMY LAPAROSCOPIC;  Surgeon: Gwenyth Ober, MD;  Location: [facility] OR;  Service: General;  Laterality: N/A;   Family History  Problem Relation Age of Onset  . Asthma Mother   . Hypertension Mother   . Parkinsonism Mother   . Diabetes Maternal Grandmother   . Hypertension Maternal Grandmother   . Stroke Maternal Grandmother   . Anesthesia problems Neg Hx   . Malignant hyperthermia Neg Hx   . Pseudochol deficiency Neg Hx   . Hypotension Neg Hx   . Asthma Brother    History  Substance Use Topics  . Smoking status: Never Smoker   . Smokeless tobacco: Never Used  . Alcohol Use: 0.0 oz/week     Comment: social   OB History   Grav Para Term Preterm Abortions TAB SAB Ect Mult  Living   2 2 1 1  0 0 0 0 0 2     Review of Systems  Gastrointestinal: Positive for abdominal pain.  All other systems reviewed and are negative.      Allergies  Ambien; Amoxicillin; and Dilaudid  Home Medications   Current Outpatient Rx  Name  Route  Sig  Dispense  Refill  . ibuprofen (ADVIL,MOTRIN) 600 MG tablet   Oral   Take 1 tablet (600 mg total) by mouth every 6 (six) hours as needed.   30 tablet   0   . levonorgestrel (MIRENA) 20 MCG/24HR IUD   Intrauterine   1 each by Intrauterine route once.         . ondansetron (ZOFRAN ODT) 4 MG disintegrating tablet   Oral   Take 1 tablet (4 mg total) by mouth every 8 (eight) hours as needed for nausea or vomiting.   10 tablet   0    BP 135/75  Pulse 115  Temp(Src) 99 F (37.2 C)  Resp 18  SpO2 99% Physical Exam  Nursing note and vitals reviewed. Constitutional: She is oriented to person, place, and time. She appears well-developed and well-nourished. No distress.  HENT:  Head: Normocephalic and atraumatic.  Eyes: EOM are normal.  Neck: Normal range of motion.  Cardiovascular: Normal rate, regular rhythm and normal heart  sounds.   Pulmonary/Chest: Effort normal and breath sounds normal.  Abdominal: Soft. She exhibits no distension.  Mild epigastric tenderness.  No guarding or rebound.  Musculoskeletal: Normal range of motion.  Neurological: She is alert and oriented to person, place, and time.  Skin: Skin is warm and dry.  Psychiatric: She has a normal mood and affect. Judgment normal.    ED Course  Procedures (including critical care time) Labs Review Labs Reviewed  CBC WITH DIFFERENTIAL - Abnormal; Notable for the following:    RDW 15.7 (*)    All other components within normal limits  COMPREHENSIVE METABOLIC PANEL - Abnormal; Notable for the following:    Total Bilirubin <0.2 (*)    All other components within normal limits  LIPASE, BLOOD  URINALYSIS, ROUTINE W REFLEX MICROSCOPIC  PREGNANCY, URINE   POC URINE PREG, ED   Imaging Review No results found.   EKG Interpretation None      MDM   Final diagnoses:  None   3:51 PM Patient reports ongoing discomfort at this time despite pain medicine.  Additional morphine will be given.  Given her ongoing upper abdominal discomfort and tenderness in her upper abdomen a CT scan will be performed to further evaluate. Care to Dr Dorna Mai    Hoy Morn, MD 06/21/13 [phone]

## 2013-06-21 NOTE — ED Notes (Signed)
Pt continues to itch but hives becoming smaller in size

## 2013-06-21 NOTE — ED Provider Notes (Addendum)
abd pain is improved.  No guard or rebound.  Pt had hives and itching to her torso, legs and face after IV contrast.   No SOB, no stridor or wheezing on exam.  Airway is clear.  Will give IV benadryl and monitor in the ED.    Saddie Benders. Isiaih Hollenbach, MD 06/21/13 1922    9:32 PM Pt's hives are improved.  Mild itching only.  Will add Rx for prednisone for allergic reaction.  Referral to [facility] clinic also given.  Saddie Benders. Dorna Mai, MD 06/21/13 2132

## 2013-06-21 NOTE — Discharge Instructions (Signed)
° °  Abdominal Pain, Adult Many things can cause abdominal pain. Usually, abdominal pain is not caused by a disease and will improve without treatment. It can often be observed and treated at home. Your health care provider will do a physical exam and possibly order blood tests and X-rays to help determine the seriousness of your pain. However, in many cases, more time must pass before a clear cause of the pain can be found. Before that point, your health care provider may not know if you need more testing or further treatment. HOME CARE INSTRUCTIONS  Monitor your abdominal pain for any changes. The following actions may help to alleviate any discomfort you are experiencing:  Only take over-the-counter or prescription medicines as directed by your health care provider.  Do not take laxatives unless directed to do so by your health care provider.  Try a clear liquid diet (broth, tea, or water) as directed by your health care provider. Slowly move to a bland diet as tolerated. SEEK MEDICAL CARE IF:  You have unexplained abdominal pain.  You have abdominal pain associated with nausea or diarrhea.  You have pain when you urinate or have a bowel movement.  You experience abdominal pain that wakes you in the night.  You have abdominal pain that is worsened or improved by eating food.  You have abdominal pain that is worsened with eating fatty foods. SEEK IMMEDIATE MEDICAL CARE IF:   Your pain does not go away within 2 hours.  You have a fever.  You keep throwing up (vomiting).  Your pain is felt only in portions of the abdomen, such as the right side or the left lower portion of the abdomen.  You pass bloody or black tarry stools. MAKE SURE YOU:  Understand these instructions.   Will watch your condition.   Will get help right away if you are not doing well or get worse.  Document Released: 12/12/2004 Document Revised: 12/23/2012 Document Reviewed: 11/11/2012 [facility] Patient  Information 2014 [place].    Narcotic and benzodiazepine use may cause drowsiness, slowed breathing or dependence.  Please use with caution and do not drive, operate machinery or watch young children alone while taking them.  Taking combinations of these medications or drinking alcohol will potentiate these effects.

## 2013-06-21 NOTE — ED Notes (Signed)
Called to CT, CT technician stated that patient had a possible reaction to the contrast. Notified Dr. Dorna Mai. Upon arriving at CT, patient was sitting upright in no acute distress, reports no difficulty breathing but did report some itching.  CT tech reports that patient had already been evaluated by the radiologist after c/o itching.  Upon return to unit, Dr Dorna Mai met patient in room.  Benadryl ordered and given.  See mar

## 2013-06-22 ENCOUNTER — Emergency Department (HOSPITAL_COMMUNITY)
Admission: EM | Admit: 2013-06-22 | Discharge: 2013-06-22 | Disposition: A | Discharge status: Home / Self Care | Payer: Self-pay | Attending: Emergency Medicine | Admitting: Emergency Medicine

## 2013-06-22 ENCOUNTER — Encounter (HOSPITAL_COMMUNITY): Payer: Self-pay | Admitting: Emergency Medicine

## 2013-06-22 ENCOUNTER — Encounter (HOSPITAL_COMMUNITY): Payer: Self-pay | Admitting: *Deleted

## 2013-06-22 ENCOUNTER — Inpatient Hospital Stay (HOSPITAL_COMMUNITY)
Admission: AD | Admit: 2013-06-22 | Discharge: 2013-06-23 | Disposition: A | Discharge status: Home / Self Care | Payer: Medicaid Other | Source: Ambulatory Visit | Attending: Obstetrics & Gynecology | Admitting: Obstetrics & Gynecology

## 2013-06-22 ENCOUNTER — Emergency Department (HOSPITAL_COMMUNITY): Payer: Medicaid Other

## 2013-06-22 DIAGNOSIS — N83209 Unspecified ovarian cyst, unspecified side: Secondary | ICD-10-CM | POA: Insufficient documentation

## 2013-06-22 DIAGNOSIS — N949 Unspecified condition associated with female genital organs and menstrual cycle: Secondary | ICD-10-CM | POA: Insufficient documentation

## 2013-06-22 DIAGNOSIS — I10 Essential (primary) hypertension: Secondary | ICD-10-CM | POA: Insufficient documentation

## 2013-06-22 DIAGNOSIS — Z975 Presence of (intrauterine) contraceptive device: Secondary | ICD-10-CM | POA: Insufficient documentation

## 2013-06-22 DIAGNOSIS — E669 Obesity, unspecified: Secondary | ICD-10-CM | POA: Insufficient documentation

## 2013-06-22 DIAGNOSIS — Z8719 Personal history of other diseases of the digestive system: Secondary | ICD-10-CM | POA: Insufficient documentation

## 2013-06-22 DIAGNOSIS — Z87440 Personal history of urinary (tract) infections: Secondary | ICD-10-CM | POA: Insufficient documentation

## 2013-06-22 DIAGNOSIS — Z79899 Other long term (current) drug therapy: Secondary | ICD-10-CM | POA: Insufficient documentation

## 2013-06-22 DIAGNOSIS — IMO0002 Reserved for concepts with insufficient information to code with codable children: Secondary | ICD-10-CM | POA: Insufficient documentation

## 2013-06-22 DIAGNOSIS — Z9089 Acquired absence of other organs: Secondary | ICD-10-CM | POA: Insufficient documentation

## 2013-06-22 DIAGNOSIS — R102 Pelvic and perineal pain: Secondary | ICD-10-CM

## 2013-06-22 DIAGNOSIS — R109 Unspecified abdominal pain: Secondary | ICD-10-CM

## 2013-06-22 DIAGNOSIS — R1031 Right lower quadrant pain: Secondary | ICD-10-CM | POA: Insufficient documentation

## 2013-06-22 DIAGNOSIS — M549 Dorsalgia, unspecified: Secondary | ICD-10-CM | POA: Insufficient documentation

## 2013-06-22 LAB — WET PREP, GENITAL: Trich, Wet Prep: NONE SEEN

## 2013-06-22 MED ORDER — KETOROLAC TROMETHAMINE 60 MG/2ML IM SOLN
60.0000 mg | Freq: Once | INTRAMUSCULAR | Status: AC
  Administered 2013-06-22: 60 mg via INTRAMUSCULAR
  Filled 2013-06-22: qty 2

## 2013-06-22 NOTE — ED Notes (Signed)
Patient returned back to room. Reports pain level 8/10.

## 2013-06-22 NOTE — MAU Note (Signed)
Right sided pain. Was seen twice for right ovarian cyst and given pain meds, meds aren't working. Pain is spread from RLQ to hip and upper abdomen. Denies vaginal bleeding or discharge.

## 2013-06-22 NOTE — Discharge Instructions (Signed)
Continue percocet for severe pain. No driving or operating heavy machinery while taking percocet. This medication may cause drowsiness.  Abdominal Pain, Women Abdominal (stomach, pelvic, or belly) pain can be caused by many things. It is important to tell your doctor:  The location of the pain.  Does it come and go or is it present all the time?  Are there things that start the pain (eating certain foods, exercise)?  Are there other symptoms associated with the pain (fever, nausea, vomiting, diarrhea)? All of this is helpful to know when trying to find the cause of the pain. CAUSES   Stomach: virus or bacteria infection, or ulcer.  Intestine: appendicitis (inflamed appendix), regional ileitis (Crohn's disease), ulcerative colitis (inflamed colon), irritable bowel syndrome, diverticulitis (inflamed diverticulum of the colon), or cancer of the stomach or intestine.  Gallbladder disease or stones in the gallbladder.  Kidney disease, kidney stones, or infection.  Pancreas infection or cancer.  Fibromyalgia (pain disorder).  Diseases of the female organs:  Uterus: fibroid (non-cancerous) tumors or infection.  Fallopian tubes: infection or tubal pregnancy.  Ovary: cysts or tumors.  Pelvic adhesions (scar tissue).  Endometriosis (uterus lining tissue growing in the pelvis and on the pelvic organs).  Pelvic congestion syndrome (female organs filling up with blood just before the menstrual period).  Pain with the menstrual period.  Pain with ovulation (producing an egg).  Pain with an IUD (intrauterine device, birth control) in the uterus.  Cancer of the female organs.  Functional pain (pain not caused by a disease, may improve without treatment).  Psychological pain.  Depression. DIAGNOSIS  Your doctor will decide the seriousness of your pain by doing an examination.  Blood tests.  X-rays.  Ultrasound.  CT scan (computed tomography, special type of  X-ray).  MRI (magnetic resonance imaging).  Cultures, for infection.  Barium enema (dye inserted in the large intestine, to better view it with X-rays).  Colonoscopy (looking in intestine with a lighted tube).  Laparoscopy (minor surgery, looking in abdomen with a lighted tube).  Major abdominal exploratory surgery (looking in abdomen with a large incision). TREATMENT  The treatment will depend on the cause of the pain.   Many cases can be observed and treated at home.  Over-the-counter medicines recommended by your caregiver.  Prescription medicine.  Antibiotics, for infection.  Birth control pills, for painful periods or for ovulation pain.  Hormone treatment, for endometriosis.  Nerve blocking injections.  Physical therapy.  Antidepressants.  Counseling with a psychologist or psychiatrist.  Minor or major surgery. HOME CARE INSTRUCTIONS   Do not take laxatives, unless directed by your caregiver.  Take over-the-counter pain medicine only if ordered by your caregiver. Do not take aspirin because it can cause an upset stomach or bleeding.  Try a clear liquid diet (broth or water) as ordered by your caregiver. Slowly move to a bland diet, as tolerated, if the pain is related to the stomach or intestine.  Have a thermometer and take your temperature several times a day, and record it.  Bed rest and sleep, if it helps the pain.  Avoid sexual intercourse, if it causes pain.  Avoid stressful situations.  Keep your follow-up appointments and tests, as your caregiver orders.  If the pain does not go away with medicine or surgery, you may try:  Acupuncture.  Relaxation exercises (yoga, meditation).  Group therapy.  Counseling. SEEK MEDICAL CARE IF:   You notice certain foods cause stomach pain.  Your home care treatment is not  helping your pain.  You need stronger pain medicine.  You want your IUD removed.  You feel faint or lightheaded.  You  develop nausea and vomiting.  You develop a rash.  You are having side effects or an allergy to your medicine. SEEK IMMEDIATE MEDICAL CARE IF:   Your pain does not go away or gets worse.  You have a fever.  Your pain is felt only in portions of the abdomen. The right side could possibly be appendicitis. The left lower portion of the abdomen could be colitis or diverticulitis.  You are passing blood in your stools (bright red or black tarry stools, with or without vomiting).  You have blood in your urine.  You develop chills, with or without a fever.  You pass out. MAKE SURE YOU:   Understand these instructions.  Will watch your condition.  Will get help right away if you are not doing well or get worse. Document Released: 12/30/2006 Document Revised: 05/27/2011 Document Reviewed: 01/19/2009 [facility] Patient Information 2014 [place], [place].  Ovarian Cyst An ovarian cyst is a fluid-filled sac that forms on an ovary. The ovaries are small organs that produce eggs in women. Various types of cysts can form on the ovaries. Most are not cancerous. Many do not cause problems, and they often go away on their own. Some may cause symptoms and require treatment. Common types of ovarian cysts include:  Functional cysts These cysts may occur every month during the menstrual cycle. This is normal. The cysts usually go away with the next menstrual cycle if the woman does not get pregnant. Usually, there are no symptoms with a functional cyst.  Endometrioma cysts These cysts form from the tissue that lines the uterus. They are also called "chocolate cysts" because they become filled with blood that turns brown. This type of cyst can cause pain in the lower abdomen during intercourse and with your menstrual period.  Cystadenoma cysts This type develops from the cells on the outside of the ovary. These cysts can get very big and cause lower abdomen pain and pain with intercourse. This type of cyst  can twist on itself, cut off its blood supply, and cause severe pain. It can also easily rupture and cause a lot of pain.  Dermoid cysts This type of cyst is sometimes found in both ovaries. These cysts may contain different kinds of body tissue, such as skin, teeth, hair, or cartilage. They usually do not cause symptoms unless they get very big.  Theca lutein cysts These cysts occur when too much of a certain hormone (human chorionic gonadotropin) is produced and overstimulates the ovaries to produce an egg. This is most common after procedures used to assist with the conception of a baby (in vitro fertilization). CAUSES   Fertility drugs can cause a condition in which multiple large cysts are formed on the ovaries. This is called ovarian hyperstimulation syndrome.  A condition called polycystic ovary syndrome can cause hormonal imbalances that can lead to nonfunctional ovarian cysts. SIGNS AND SYMPTOMS  Many ovarian cysts do not cause symptoms. If symptoms are present, they may include:  Pelvic pain or pressure.  Pain in the lower abdomen.  Pain during sexual intercourse.  Increasing girth (swelling) of the abdomen.  Abnormal menstrual periods.  Increasing pain with menstrual periods.  Stopping having menstrual periods without being pregnant. DIAGNOSIS  These cysts are commonly found during a routine or annual pelvic exam. Tests may be ordered to find out more about the cyst.  These tests may include:  Ultrasound.  X-ray of the pelvis.  CT scan.  MRI.  Blood tests. TREATMENT  Many ovarian cysts go away on their own without treatment. Your health care provider may want to check your cyst regularly for 2 3 months to see if it changes. For women in menopause, it is particularly important to monitor a cyst closely because of the higher rate of ovarian cancer in menopausal women. When treatment is needed, it may include any of the following:  A procedure to drain the cyst  (aspiration). This may be done using a long needle and ultrasound. It can also be done through a laparoscopic procedure. This involves using a thin, lighted tube with a tiny camera on the end (laparoscope) inserted through a small incision.  Surgery to remove the whole cyst. This may be done using laparoscopic surgery or an open surgery involving a larger incision in the lower abdomen.  Hormone treatment or birth control pills. These methods are sometimes used to help dissolve a cyst. HOME CARE INSTRUCTIONS   Only take over-the-counter or prescription medicines as directed by your health care provider.  Follow up with your health care provider as directed.  Get regular pelvic exams and Pap tests. SEEK MEDICAL CARE IF:   Your periods are late, irregular, or painful, or they stop.  Your pelvic pain or abdominal pain does not go away.  Your abdomen becomes larger or swollen.  You have pressure on your bladder or trouble emptying your bladder completely.  You have pain during sexual intercourse.  You have feelings of fullness, pressure, or discomfort in your stomach.  You lose weight for no apparent reason.  You feel generally ill.  You become constipated.  You lose your appetite.  You develop acne.  You have an increase in body and facial hair.  You are gaining weight, without changing your exercise and eating habits.  You think you are pregnant. SEEK IMMEDIATE MEDICAL CARE IF:   You have increasing abdominal pain.  You feel sick to your stomach (nauseous), and you throw up (vomit).  You develop a fever that comes on suddenly.  You have abdominal pain during a bowel movement.  Your menstrual periods become heavier than usual. Document Released: 03/04/2005 Document Revised: 12/23/2012 Document Reviewed: 11/09/2012 [facility] Patient Information 2014 [place].

## 2013-06-22 NOTE — ED Notes (Signed)
Patient is gone to ultrasound.

## 2013-06-22 NOTE — ED Notes (Signed)
Discussed with the patient that follow up care is important for pain management.

## 2013-06-22 NOTE — ED Notes (Signed)
Robyn, PA-C at the bedside to update patient's results and plan for discharge.  No need for urine sample per Robyn.

## 2013-06-22 NOTE — ED Provider Notes (Signed)
CSN: [id number]     Arrival date & time 06/22/13  1427 History   First MD Initiated Contact with Patient 06/22/13 1823     Chief Complaint  Patient presents with  . Abdominal Pain     (Consider location/radiation/quality/duration/timing/severity/associated sxs/prior Treatment) HPI Comments: Patient is a 23 year old female who presents back to the emergency department after being seen last night complaining of continued abdominal pain which has been present x4 days. Yesterday in the ED she had labs and a CT scan of her abdomen and pelvis, labs did not have any acute findings, CT scan showing 4 cm simple appearing right ovarian cyst likely a follicular cyst. IUD obliquely oriented over the region of the lower uterine segment unchanged, although this is not optimal in position. She was discharged with Percocet, states that this is not helping her pain. Pain has been intermittent, coming going at random, located on the right side of her abdomen radiating around to her back and her mid epigastric area. States she tried to have a bowel movement while in the waiting room today, she was able to have a small bowel movement but had severe, sharp and stabbing pain on the right side of her back while having a bowel movement. Denies nausea, vomiting, fever, chills, diarrhea, increased urinary frequency, urgency, dysuria or hematuria, vaginal bleeding or discharge. She has an IUD. History of both appendectomy and cholecystectomy.  Patient is a 23 y.o. female presenting with abdominal pain. The history is provided by the patient and medical records.  Abdominal Pain   Past Medical History  Diagnosis Date  . UTI (lower urinary tract infection)   . Headache(784.0)   . Gallstones   . Hypertension     GHTN with last pg  . Pregnancy induced hypertension    Past Surgical History  Procedure Laterality Date  . Cholecystectomy N/A 07/27/2012    Procedure: LAPAROSCOPIC CHOLECYSTECTOMY WITH INTRAOPERATIVE  CHOLANGIOGRAM;  Surgeon: Madilyn Hook, DO;  Location: [facility];  Service: General;  Laterality: N/A;  . Appendectomy    . Laparoscopic appendectomy N/A 12/09/2012    Procedure: APPENDECTOMY LAPAROSCOPIC;  Surgeon: Gwenyth Ober, MD;  Location: [facility] OR;  Service: General;  Laterality: N/A;   Family History  Problem Relation Age of Onset  . Asthma Mother   . Hypertension Mother   . Parkinsonism Mother   . Diabetes Maternal Grandmother   . Hypertension Maternal Grandmother   . Stroke Maternal Grandmother   . Anesthesia problems Neg Hx   . Malignant hyperthermia Neg Hx   . Pseudochol deficiency Neg Hx   . Hypotension Neg Hx   . Asthma Brother    History  Substance Use Topics  . Smoking status: Never Smoker   . Smokeless tobacco: Never Used  . Alcohol Use: 0.0 oz/week     Comment: social   OB History   Grav Para Term Preterm Abortions TAB SAB Ect Mult Living   2 2 1 1  0 0 0 0 0 2     Review of Systems  Gastrointestinal: Positive for abdominal pain.  Musculoskeletal: Positive for back pain.  All other systems reviewed and are negative.      Allergies  Ambien; Amoxicillin; Dilaudid; and Iohexol  Home Medications   Current Outpatient Rx  Name  Route  Sig  Dispense  Refill  . diphenhydrAMINE (BENADRYL) 25 MG tablet   Oral   Take 25 mg by mouth once.         Marland Kitchen levonorgestrel (MIRENA) 20 MCG/24HR  IUD   Intrauterine   1 each by Intrauterine route once. Implanted end of May 2014         . omeprazole (PRILOSEC) 20 MG capsule   Oral   Take 1 capsule (20 mg total) by mouth daily.   30 capsule   0   . ondansetron (ZOFRAN ODT) 4 MG disintegrating tablet   Oral   Take 1 tablet (4 mg total) by mouth every 8 (eight) hours as needed for nausea or vomiting.   10 tablet   0   . oxyCODONE-acetaminophen (PERCOCET/ROXICET) 5-325 MG per tablet   Oral   Take 1 tablet by mouth every 4 (four) hours as needed for severe pain.   20 tablet   0   . predniSONE (DELTASONE) 20 MG  tablet   Oral   Take 2 tablets (40 mg total) by mouth daily.   12 tablet   0    BP 113/60  Pulse 81  Temp(Src) 98.4 F (36.9 C) (Oral)  Resp 16  Ht 5' (1.524 m)  Wt 226 lb (102.513 kg)  BMI 44.14 kg/m2  SpO2 98% Physical Exam  Nursing note and vitals reviewed. Constitutional: She is oriented to person, place, and time. She appears well-developed and well-nourished. No distress.  Obese  HENT:  Head: Normocephalic and atraumatic.  Mouth/Throat: Oropharynx is clear and moist.  Eyes: Conjunctivae are normal.  Neck: Normal range of motion. Neck supple.  Cardiovascular: Normal rate, regular rhythm and normal heart sounds.   Pulmonary/Chest: Effort normal and breath sounds normal.  Abdominal: Soft. Normal appearance and bowel sounds are normal. She exhibits no distension and no mass. There is no rigidity, no rebound, no guarding and no CVA tenderness.  Mild TTP RUQ, RLQ, epigastric. No peritoneal signs.  Musculoskeletal: Normal range of motion. She exhibits no edema.  Neurological: She is alert and oriented to person, place, and time.  Skin: Skin is warm and dry. She is not diaphoretic.  Psychiatric: She has a normal mood and affect. Her behavior is normal.    ED Course  Procedures (including critical care time) Labs Review Labs Reviewed - No data to display Imaging Review US Transvaginal Non-ob  06/22/2013   CLINICAL DATA:  Right-sided abdominal pain.  IUD for 1 year.  EXAM: TRANSABDOMINAL AND TRANSVAGINAL ULTRASOUND OF PELVIS  TECHNIQUE: Both transabdominal and transvaginal ultrasound examinations of the pelvis were performed. Transabdominal technique was performed for global imaging of the pelvis including uterus, ovaries, adnexal regions, and pelvic cul-de-sac. It was necessary to proceed with endovaginal exam following the transabdominal exam to visualize the endometrium and ovaries.  COMPARISON:  CT 06/21/2013 and ultrasound 07/06/2012  FINDINGS: Uterus  Measurements: 3.7 x  5.2 x 7.6 cm. No fibroids or other mass visualized. Patient's IUD is seen over the endometrial canal over the lower uterine segment.  Endometrium  Thickness: 4.5 mm.  No focal abnormality visualized.  Right ovary  Measurements: 3.7 x 4.5 x 4.6 cm. There is a simple appearing 4.2 cm cyst.  Left ovary  Measurements: 2.5 x 2.4 x 2.9 cm. Normal appearance/no adnexal mass.  Other findings  No free fluid.  IMPRESSION: No acute findings. IUD somewhat low in position over the endometrial canal at the level of the lower uterine segment.  4.2 cm simple appearing right ovarian cyst likely a follicular cyst.   Electronically Signed   By: Marin Olp M.D.   On: 06/22/2013 19:48   US Pelvis Complete  06/22/2013   CLINICAL DATA:  Right-sided  abdominal pain.  IUD for 1 year.  EXAM: TRANSABDOMINAL AND TRANSVAGINAL ULTRASOUND OF PELVIS  TECHNIQUE: Both transabdominal and transvaginal ultrasound examinations of the pelvis were performed. Transabdominal technique was performed for global imaging of the pelvis including uterus, ovaries, adnexal regions, and pelvic cul-de-sac. It was necessary to proceed with endovaginal exam following the transabdominal exam to visualize the endometrium and ovaries.  COMPARISON:  CT 06/21/2013 and ultrasound 07/06/2012  FINDINGS: Uterus  Measurements: 3.7 x 5.2 x 7.6 cm. No fibroids or other mass visualized. Patient's IUD is seen over the endometrial canal over the lower uterine segment.  Endometrium  Thickness: 4.5 mm.  No focal abnormality visualized.  Right ovary  Measurements: 3.7 x 4.5 x 4.6 cm. There is a simple appearing 4.2 cm cyst.  Left ovary  Measurements: 2.5 x 2.4 x 2.9 cm. Normal appearance/no adnexal mass.  Other findings  No free fluid.  IMPRESSION: No acute findings. IUD somewhat low in position over the endometrial canal at the level of the lower uterine segment.  4.2 cm simple appearing right ovarian cyst likely a follicular cyst.   Electronically Signed   By: Marin Olp M.D.    On: 06/22/2013 19:48   Ct Abdomen Pelvis W Contrast  06/21/2013   CLINICAL DATA:  Abdominal pain 4 days.  Prior appendectomy.  EXAM: CT ABDOMEN AND PELVIS WITH CONTRAST  TECHNIQUE: Multidetector CT imaging of the abdomen and pelvis was performed using the standard protocol following bolus administration of intravenous contrast.  CONTRAST:  166mL OMNIPAQUE IOHEXOL 300 MG/ML SOLN. Note that patient experienced an allergic reaction to the IV contrast in the form of itching and hives predominantly over the posterior thorax. Patient otherwise appears relaxed and in no obvious distress. No troubles breathing. 50 mg IV Benadryl was given.  COMPARISON:  12/09/2012  FINDINGS: Lung bases are within normal.  Abdominal images demonstrate evidence of a prior cholecystectomy. Mild stable prominence of the common bile duct likely due to patient's post cholecystectomy state. The spleen, pancreas, liver and adrenal glands are within normal. Kidneys are normal. Ureters are normal. Surgical absence of the appendix. Small bowel and colon are within normal. There is no free fluid or focal inflammatory change. Abdominal aorta normal in caliber.  Pelvic images demonstrate an obliquely oriented IUD over the lower uterine segment unchanged. There is a 4 cm homogeneous right ovarian cyst likely a follicular cyst. There is no free pelvic fluid. Bladder is unremarkable.  IMPRESSION: No acute findings in the abdomen/pelvis.  4 cm simple appearing right ovarian cyst likely a follicular cyst.  IUD obliquely oriented over the region of the lower uterine segment unchanged, although this is not optimal in position. Consider GYN consultation.   Electronically Signed   By: Marin Olp M.D.   On: 06/21/2013 19:38     EKG Interpretation None      MDM   Final diagnoses:  Ovarian cyst  Abdominal pain   Patient presenting back with continued abdominal pain after being seen yesterday. Had CT scan and labs as stated above in HPI. She is  well appearing and in no apparent distress, afebrile with normal vital signs. Labs yesterday without any acute findings, normal UA and negative pregnancy. Pelvic US pending to further evaluate IUD and cyst. Abdomen soft, mild tenderness, no peritoneal signs. 8:00 PM Pelvic US showing no acute findings, IUD somewhat low in position over the endometrial canal at the level of the lwoer uterine segment. 4.2 cm simple appearing right ovarian cyst, likely follicular.  I discussed findings with pt, advised her to f/u with GYN. Resources given as she states she does not have one. Stable for d/c. Return precautions given. Patient states understanding of treatment care plan and is agreeable.  Illene Labrador, PA-C 06/22/13 2001

## 2013-06-22 NOTE — MAU Provider Note (Signed)
History     CSN: [id number]  Arrival date and time: 06/22/13 2043   First Provider Initiated Contact with Patient 06/22/13 2247      Chief Complaint  Patient presents with  . Ovarian Cyst  . Abdominal Pain   HPI Ms. Emma Chambers is a 23 y.o. [id number] who presents to MAU today with complaint of RLQ pain. The patient has had appendectomy and cholecystectomy previously. She was seen an evaluated at [facility] yesterday. CT scan and US show a 4 cm simple right ovarian cyst. IUD is in the uterus and oblique in orientation. Patient had IUD placed 08/2012 in [place]. She denies fever, vaginal bleeding, N/V/D or constipation or UTI symptoms. The patient had a normal CBC, CMP, lipase, UA and negative UPT yesterday. Pelvic exam was not performed.   OB History   Grav Para Term Preterm Abortions TAB SAB Ect Mult Living   2 2 1 1  0 0 0 0 0 2      Past Medical History  Diagnosis Date  . UTI (lower urinary tract infection)   . Headache(784.0)   . Gallstones   . Hypertension     GHTN with last pg  . Pregnancy induced hypertension     Past Surgical History  Procedure Laterality Date  . Cholecystectomy N/A 07/27/2012    Procedure: LAPAROSCOPIC CHOLECYSTECTOMY WITH INTRAOPERATIVE CHOLANGIOGRAM;  Surgeon: Madilyn Hook, DO;  Location: [facility];  Service: General;  Laterality: N/A;  . Appendectomy    . Laparoscopic appendectomy N/A 12/09/2012    Procedure: APPENDECTOMY LAPAROSCOPIC;  Surgeon: Gwenyth Ober, MD;  Location: [facility] OR;  Service: General;  Laterality: N/A;    Family History  Problem Relation Age of Onset  . Asthma Mother   . Hypertension Mother   . Parkinsonism Mother   . Diabetes Maternal Grandmother   . Hypertension Maternal Grandmother   . Stroke Maternal Grandmother   . Anesthesia problems Neg Hx   . Malignant hyperthermia Neg Hx   . Pseudochol deficiency Neg Hx   . Hypotension Neg Hx   . Asthma Brother     History  Substance Use Topics  . Smoking status: Never Smoker   .  Smokeless tobacco: Never Used  . Alcohol Use: 0.0 oz/week     Comment: social    Allergies:  Allergies  Allergen Reactions  . Ambien [Zolpidem Tartrate] Other (See Comments)    Caused her to have hallucinations  . Amoxicillin Nausea And Vomiting and Other (See Comments)    Tics & twitches  . Dilaudid [Hydromorphone Hcl] Itching  . Iohexol Hives and Itching    Prescriptions prior to admission  Medication Sig Dispense Refill  . omeprazole (PRILOSEC) 20 MG capsule Take 1 capsule (20 mg total) by mouth daily.  30 capsule  0  . ondansetron (ZOFRAN ODT) 4 MG disintegrating tablet Take 1 tablet (4 mg total) by mouth every 8 (eight) hours as needed for nausea or vomiting.  10 tablet  0  . oxyCODONE-acetaminophen (PERCOCET/ROXICET) 5-325 MG per tablet Take 1 tablet by mouth every 4 (four) hours as needed for severe pain.  20 tablet  0  . diphenhydrAMINE (BENADRYL) 25 MG tablet Take 25 mg by mouth once.      Marland Kitchen levonorgestrel (MIRENA) 20 MCG/24HR IUD 1 each by Intrauterine route once. Implanted end of May 2014      . predniSONE (DELTASONE) 20 MG tablet Take 2 tablets (40 mg total) by mouth daily.  12 tablet  0  Review of Systems  Constitutional: Negative for fever and malaise/fatigue.  Gastrointestinal: Positive for abdominal pain. Negative for nausea, vomiting, diarrhea and constipation.  Genitourinary: Negative for dysuria, urgency and frequency.       Neg -vaginal bleeding, discharge   Physical Exam   Blood pressure 123/88, pulse 78, temperature 99 F (37.2 C), temperature source Oral, resp. rate 18, height 5' (1.524 m), weight 102.694 kg (226 lb 6.4 oz), SpO2 100.00%.  Physical Exam  Constitutional: She is oriented to person, place, and time. She appears well-developed and well-nourished. No distress.  HENT:  Head: Normocephalic and atraumatic.  Cardiovascular: Normal rate.   Respiratory: Effort normal.  GI: Soft. Bowel sounds are normal. She exhibits no distension and no mass.  There is no tenderness. There is no rebound and no guarding.  Genitourinary: Uterus is not enlarged and not tender. Cervix exhibits no motion tenderness, no discharge and no friability. Right adnexum displays no mass and no tenderness. Left adnexum displays no mass and no tenderness. No bleeding around the vagina. Vaginal discharge (small amount of thin, white discharge noted) found.    Neurological: She is alert and oriented to person, place, and time.  Skin: Skin is warm and dry. No erythema.  Psychiatric: She has a normal mood and affect.   Results for orders placed during the hospital encounter of 06/22/13 (from the past 24 hour(s))  WET PREP, GENITAL     Status: Abnormal   Collection Time    06/22/13 11:16 PM      Result Value Ref Range   Yeast Wet Prep HPF POC FEW (*) NONE SEEN   Trich, Wet Prep NONE SEEN  NONE SEEN   Clue Cells Wet Prep HPF POC FEW (*) NONE SEEN   WBC, Wet Prep HPF POC MANY (*) NONE SEEN   US Transvaginal Non-ob  06/22/2013   CLINICAL DATA:  Right-sided abdominal pain.  IUD for 1 year.  EXAM: TRANSABDOMINAL AND TRANSVAGINAL ULTRASOUND OF PELVIS  TECHNIQUE: Both transabdominal and transvaginal ultrasound examinations of the pelvis were performed. Transabdominal technique was performed for global imaging of the pelvis including uterus, ovaries, adnexal regions, and pelvic cul-de-sac. It was necessary to proceed with endovaginal exam following the transabdominal exam to visualize the endometrium and ovaries.  COMPARISON:  CT 06/21/2013 and ultrasound 07/06/2012  FINDINGS: Uterus  Measurements: 3.7 x 5.2 x 7.6 cm. No fibroids or other mass visualized. Patient's IUD is seen over the endometrial canal over the lower uterine segment.  Endometrium  Thickness: 4.5 mm.  No focal abnormality visualized.  Right ovary  Measurements: 3.7 x 4.5 x 4.6 cm. There is a simple appearing 4.2 cm cyst.  Left ovary  Measurements: 2.5 x 2.4 x 2.9 cm. Normal appearance/no adnexal mass.  Other  findings  No free fluid.  IMPRESSION: No acute findings. IUD somewhat low in position over the endometrial canal at the level of the lower uterine segment.  4.2 cm simple appearing right ovarian cyst likely a follicular cyst.   Electronically Signed   By: Marin Olp M.D.   On: 06/22/2013 19:48   US Pelvis Complete  06/22/2013   CLINICAL DATA:  Right-sided abdominal pain.  IUD for 1 year.  EXAM: TRANSABDOMINAL AND TRANSVAGINAL ULTRASOUND OF PELVIS  TECHNIQUE: Both transabdominal and transvaginal ultrasound examinations of the pelvis were performed. Transabdominal technique was performed for global imaging of the pelvis including uterus, ovaries, adnexal regions, and pelvic cul-de-sac. It was necessary to proceed with endovaginal exam following the transabdominal exam to  visualize the endometrium and ovaries.  COMPARISON:  CT 06/21/2013 and ultrasound 07/06/2012  FINDINGS: Uterus  Measurements: 3.7 x 5.2 x 7.6 cm. No fibroids or other mass visualized. Patient's IUD is seen over the endometrial canal over the lower uterine segment.  Endometrium  Thickness: 4.5 mm.  No focal abnormality visualized.  Right ovary  Measurements: 3.7 x 4.5 x 4.6 cm. There is a simple appearing 4.2 cm cyst.  Left ovary  Measurements: 2.5 x 2.4 x 2.9 cm. Normal appearance/no adnexal mass.  Other findings  No free fluid.  IMPRESSION: No acute findings. IUD somewhat low in position over the endometrial canal at the level of the lower uterine segment.  4.2 cm simple appearing right ovarian cyst likely a follicular cyst.   Electronically Signed   By: Marin Olp M.D.   On: 06/22/2013 19:48   Ct Abdomen Pelvis W Contrast  06/21/2013   CLINICAL DATA:  Abdominal pain 4 days.  Prior appendectomy.  EXAM: CT ABDOMEN AND PELVIS WITH CONTRAST  TECHNIQUE: Multidetector CT imaging of the abdomen and pelvis was performed using the standard protocol following bolus administration of intravenous contrast.  CONTRAST:  144mL OMNIPAQUE IOHEXOL 300 MG/ML  SOLN. Note that patient experienced an allergic reaction to the IV contrast in the form of itching and hives predominantly over the posterior thorax. Patient otherwise appears relaxed and in no obvious distress. No troubles breathing. 50 mg IV Benadryl was given.  COMPARISON:  12/09/2012  FINDINGS: Lung bases are within normal.  Abdominal images demonstrate evidence of a prior cholecystectomy. Mild stable prominence of the common bile duct likely due to patient's post cholecystectomy state. The spleen, pancreas, liver and adrenal glands are within normal. Kidneys are normal. Ureters are normal. Surgical absence of the appendix. Small bowel and colon are within normal. There is no free fluid or focal inflammatory change. Abdominal aorta normal in caliber.  Pelvic images demonstrate an obliquely oriented IUD over the lower uterine segment unchanged. There is a 4 cm homogeneous right ovarian cyst likely a follicular cyst. There is no free pelvic fluid. Bladder is unremarkable.  IMPRESSION: No acute findings in the abdomen/pelvis.  4 cm simple appearing right ovarian cyst likely a follicular cyst.  IUD obliquely oriented over the region of the lower uterine segment unchanged, although this is not optimal in position. Consider GYN consultation.   Electronically Signed   By: Marin Olp M.D.   On: 06/21/2013 19:38    MAU Course  Procedures None   MDM Discussed with Dr. Roselie Awkward. Recommends treating with Rocephin, Zithromax, Flagyl and Diflucan Rx for Ibuprofen to be taken in addition to the previously prescribed Percocet  Assessment and Plan  A: Right simple ovarian cyst Pelvic pain  P: Discharge home Rx for Flagyl, Diflucan and Ibuprofen sent to patient's pharmacy Patient advised to continue Percocet PRN Patient encouraged to make follow-up appointment in [place] if symptoms persist Patient may return to MAU as needed or if her condition were to change or worsen  Farris Has, PA-C  06/23/2013, 3:20  AM

## 2013-06-22 NOTE — ED Notes (Addendum)
Pt was here yesterday for right side abd pain was diagnosed with ovarian cyst but reports no relief with pain meds given (percocet at home). Pain radiates around to her back and has stabbing pain when she has a bowel movement. No acute distress noted at this time.

## 2013-06-22 NOTE — ED Notes (Signed)
Discussed the need to void. Patient will attempt in order to submit sample, but says she voided while at ultrasound.

## 2013-06-23 DIAGNOSIS — N83209 Unspecified ovarian cyst, unspecified side: Secondary | ICD-10-CM

## 2013-06-23 LAB — GC/CHLAMYDIA PROBE AMP
CT Probe RNA: NEGATIVE
GC Probe RNA: NEGATIVE

## 2013-06-23 MED ORDER — METRONIDAZOLE 500 MG PO TABS: 500.0000 mg | ORAL_TABLET | Freq: Two times a day (BID) | ORAL | Status: DC

## 2013-06-23 MED ORDER — AZITHROMYCIN 250 MG PO TABS
1000.0000 mg | ORAL_TABLET | Freq: Once | ORAL | Status: AC
  Administered 2013-06-23: 1000 mg via ORAL
  Filled 2013-06-23: qty 4

## 2013-06-23 MED ORDER — IBUPROFEN 800 MG PO TABS: 800.0000 mg | ORAL_TABLET | Freq: Three times a day (TID) | ORAL | Status: DC | PRN

## 2013-06-23 MED ORDER — CEFTRIAXONE SODIUM 250 MG IJ SOLR
250.0000 mg | INTRAMUSCULAR | Status: DC
  Administered 2013-06-23: 250 mg via INTRAMUSCULAR
  Filled 2013-06-23: qty 250

## 2013-06-23 MED ORDER — FLUCONAZOLE 150 MG PO TABS: 150.0000 mg | ORAL_TABLET | Freq: Every day | ORAL | Status: DC

## 2013-06-23 MED ORDER — FLUCONAZOLE 150 MG PO TABS: ORAL_TABLET | ORAL | Status: DC

## 2013-06-23 NOTE — Discharge Instructions (Signed)
Ovarian Cyst An ovarian cyst is a sac filled with fluid or blood. This sac is attached to the ovary. Some cysts go away on their own. Other cysts need treatment.  HOME CARE   Only take medicine as told by your doctor.  Follow up with your doctor as told.  Get regular pelvic exams and Pap tests. GET HELP IF:  Your periods are late, not regular, or painful.  You stop having periods.  Your belly (abdominal) or pelvic pain does not go away.  Your belly becomes large or puffy (swollen).  You have a hard time peeing (totally emptying your bladder).  You have pressure on your bladder.  You have pain during sex.  You feel fullness, pressure, or discomfort in your belly.  You lose weight for no reason.  You feel sick most of the time.  You have a hard time pooping (constipation).  You do not feel like eating.  You develop pimples (acne).  You have an increase in hair on your body and face.  You are gaining weight for no reason.  You think you are pregnant. GET HELP RIGHT AWAY IF:   Your belly pain gets worse.  You feel sick to your stomach (nauseous), and you throw up (vomit).  You have a fever that comes on fast.  You have belly pain while pooping (bowel movement).  Your periods are heavier than usual. MAKE SURE YOU:   Understand these instructions.  Will watch your condition.  Will get help right away if you are not doing well or get worse. Document Released: 08/21/2007 Document Revised: 12/23/2012 Document Reviewed: 11/09/2012 [facility] Patient Information 2014 [place].

## 2013-06-24 NOTE — ED Provider Notes (Signed)
Medical screening examination/treatment/procedure(s) were performed by non-physician practitioner and as supervising physician I was immediately available for consultation/collaboration.   Emma Murdy T Darely Becknell, MD 06/24/13 1557 

## 2013-07-06 ENCOUNTER — Emergency Department (HOSPITAL_COMMUNITY)
Admission: EM | Admit: 2013-07-06 | Discharge: 2013-07-06 | Disposition: A | Discharge status: Home / Self Care | Payer: Medicaid Other | Attending: Emergency Medicine | Admitting: Emergency Medicine

## 2013-07-06 ENCOUNTER — Encounter (HOSPITAL_COMMUNITY): Payer: Self-pay | Admitting: Emergency Medicine

## 2013-07-06 DIAGNOSIS — R102 Pelvic and perineal pain: Secondary | ICD-10-CM

## 2013-07-06 DIAGNOSIS — Z8719 Personal history of other diseases of the digestive system: Secondary | ICD-10-CM | POA: Insufficient documentation

## 2013-07-06 DIAGNOSIS — Z79899 Other long term (current) drug therapy: Secondary | ICD-10-CM | POA: Insufficient documentation

## 2013-07-06 DIAGNOSIS — K5900 Constipation, unspecified: Secondary | ICD-10-CM | POA: Insufficient documentation

## 2013-07-06 DIAGNOSIS — Z8679 Personal history of other diseases of the circulatory system: Secondary | ICD-10-CM | POA: Insufficient documentation

## 2013-07-06 DIAGNOSIS — M549 Dorsalgia, unspecified: Secondary | ICD-10-CM | POA: Insufficient documentation

## 2013-07-06 DIAGNOSIS — Z9089 Acquired absence of other organs: Secondary | ICD-10-CM | POA: Insufficient documentation

## 2013-07-06 DIAGNOSIS — Z87440 Personal history of urinary (tract) infections: Secondary | ICD-10-CM | POA: Insufficient documentation

## 2013-07-06 DIAGNOSIS — N949 Unspecified condition associated with female genital organs and menstrual cycle: Secondary | ICD-10-CM | POA: Insufficient documentation

## 2013-07-06 DIAGNOSIS — Z3202 Encounter for pregnancy test, result negative: Secondary | ICD-10-CM | POA: Insufficient documentation

## 2013-07-06 DIAGNOSIS — Z880 Allergy status to penicillin: Secondary | ICD-10-CM | POA: Insufficient documentation

## 2013-07-06 DIAGNOSIS — R1110 Vomiting, unspecified: Secondary | ICD-10-CM | POA: Insufficient documentation

## 2013-07-06 LAB — CBC
HCT: 37 % (ref 36.0–46.0)
Hemoglobin: 12 g/dL (ref 12.0–15.0)
MCH: 26.4 pg (ref 26.0–34.0)
MCHC: 32.4 g/dL (ref 30.0–36.0)
MCV: 81.5 fL (ref 78.0–100.0)
Platelets: 322 10*3/uL (ref 150–400)
RBC: 4.54 MIL/uL (ref 3.87–5.11)
RDW: 15.3 % (ref 11.5–15.5)
WBC: 10.2 10*3/uL (ref 4.0–10.5)

## 2013-07-06 LAB — COMPREHENSIVE METABOLIC PANEL
ALT: 10 U/L (ref 0–35)
AST: 14 U/L (ref 0–37)
Albumin: 4.1 g/dL (ref 3.5–5.2)
Alkaline Phosphatase: 93 U/L (ref 39–117)
BUN: 10 mg/dL (ref 6–23)
CO2: 24 mEq/L (ref 19–32)
Calcium: 9.3 mg/dL (ref 8.4–10.5)
Chloride: 104 mEq/L (ref 96–112)
Creatinine, Ser: 0.59 mg/dL (ref 0.50–1.10)
GFR calc Af Amer: >90 mL/min (ref >90)
GFR calc non Af Amer: >90 mL/min (ref >90)
Glucose, Bld: 77 mg/dL (ref 70–99)
Potassium: 3.8 mEq/L (ref 3.7–5.3)
Sodium: 139 mEq/L (ref 137–147)
Total Bilirubin: 0.3 mg/dL (ref 0.3–1.2)
Total Protein: 7.6 g/dL (ref 6.0–8.3)

## 2013-07-06 LAB — URINALYSIS, ROUTINE W REFLEX MICROSCOPIC
Bilirubin Urine: NEGATIVE
Glucose, UA: NEGATIVE mg/dL
Hgb urine dipstick: NEGATIVE
Ketones, ur: NEGATIVE mg/dL
Leukocytes, UA: NEGATIVE
Nitrite: NEGATIVE
Protein, ur: NEGATIVE mg/dL
Specific Gravity, Urine: 1.028 (ref 1.005–1.030)
Urobilinogen, UA: 0.2 mg/dL (ref 0.0–1.0)
pH: 6 (ref 5.0–8.0)

## 2013-07-06 LAB — WET PREP, GENITAL
Clue Cells Wet Prep HPF POC: NONE SEEN
Trich, Wet Prep: NONE SEEN
Yeast Wet Prep HPF POC: NONE SEEN

## 2013-07-06 LAB — POC URINE PREG, ED: Preg Test, Ur: NEGATIVE

## 2013-07-06 LAB — RPR

## 2013-07-06 MED ORDER — OXYCODONE-ACETAMINOPHEN 5-325 MG PO TABS
1.0000 | ORAL_TABLET | Freq: Once | ORAL | Status: AC
  Administered 2013-07-06: 1 via ORAL
  Filled 2013-07-06: qty 1

## 2013-07-06 MED ORDER — ETODOLAC 500 MG PO TABS: 500.0000 mg | ORAL_TABLET | Freq: Two times a day (BID) | ORAL | Status: DC

## 2013-07-06 NOTE — ED Notes (Signed)
Pt asleep when this RN went in to assess pain.  Pt stating that her pain is unchanged at an 8.  Pt return to sleeping when RN left room.

## 2013-07-06 NOTE — ED Notes (Signed)
Pt reports lower back pain, nausea, vomiting for several weeks. Reports groin pain and rectal pain.

## 2013-07-06 NOTE — ED Notes (Signed)
MD at bedside. 

## 2013-07-06 NOTE — ED Notes (Signed)
Pt ambulating to restroom to provide urine sample.

## 2013-07-06 NOTE — ED Notes (Signed)
Pt reports intermittent pelvic and lower back pain for a week.

## 2013-07-06 NOTE — Discharge Instructions (Signed)
Pelvic Pain, Female °Female pelvic pain can be caused by many different things and start from a variety of places. Pelvic pain refers to pain that is located in the lower half of the abdomen and between your hips. The pain may occur over a short period of time (acute) or may be reoccurring (chronic). The cause of pelvic pain may be related to disorders affecting the female reproductive organs (gynecologic), but it may also be related to the bladder, kidney stones, an intestinal complication, or muscle or skeletal problems. Getting help right away for pelvic pain is important, especially if there has been severe, sharp, or a sudden onset of unusual pain. It is also important to get help right away because some types of pelvic pain can be life threatening.  °CAUSES  °Below are only some of the causes of pelvic pain. The causes of pelvic pain can be in one of several categories.  °· Gynecologic. °· Pelvic inflammatory disease. °· Sexually transmitted infection. °· Ovarian cyst or a twisted ovarian ligament (ovarian torsion). °· Uterine lining that grows outside the uterus (endometriosis). °· Fibroids, cysts, or tumors. °· Ovulation. °· Pregnancy. °· Pregnancy that occurs outside the uterus (ectopic pregnancy). °· Miscarriage. °· Labor. °· Abruption of the placenta or ruptured uterus. °· Infection. °· Uterine infection (endometritis). °· Bladder infection. °· Diverticulitis. °· Miscarriage related to a uterine infection (septic abortion). °· Bladder. °· Inflammation of the bladder (cystitis). °· Kidney stone(s). °· Gastrointenstinal. °· Constipation. °· Diverticulitis. °· Neurologic. °· Trauma. °· Feeling pelvic pain because of mental or emotional causes (psychosomatic). °· Cancers of the bowel or pelvis. °EVALUATION  °Your caregiver will want to take a careful history of your concerns. This includes recent changes in your health, a careful gynecologic history of your periods (menses), and a sexual history. Obtaining  your family history and medical history is also important. Your caregiver may suggest a pelvic exam. A pelvic exam will help identify the location and severity of the pain. It also helps in the evaluation of which organ system may be involved. In order to identify the cause of the pelvic pain and be properly treated, your caregiver may order tests. These tests may include:  °· A pregnancy test. °· Pelvic ultrasonography. °· An X-ray exam of the abdomen. °· A urinalysis or evaluation of vaginal discharge. °· Blood tests. °HOME CARE INSTRUCTIONS  °· Only take over-the-counter or prescription medicines for pain, discomfort, or fever as directed by your caregiver.   °· Rest as directed by your caregiver.   °· Eat a balanced diet.   °· Drink enough fluids to make your urine clear or pale yellow, or as directed.   °· Avoid sexual intercourse if it causes pain.   °· Apply warm or cold compresses to the lower abdomen depending on which one helps the pain.   °· Avoid stressful situations.   °· Keep a journal of your pelvic pain. Write down when it started, where the pain is located, and if there are things that seem to be associated with the pain, such as food or your menstrual cycle. °· Follow up with your caregiver as directed.   °SEEK MEDICAL CARE IF: °· Your medicine does not help your pain. °· You have abnormal vaginal discharge. °SEEK IMMEDIATE MEDICAL CARE IF:  °· You have heavy bleeding from the vagina.   °· Your pelvic pain increases.   °· You feel lightheaded or faint.   °· You have chills.   °· You have pain with urination or blood in your urine.   °· You have uncontrolled   diarrhea or vomiting.   °· You have a fever or persistent symptoms for more than 3 days. °· You have a fever and your symptoms suddenly get worse.   °· You are being physically or sexually abused.   °MAKE SURE YOU: °· Understand these instructions. °· Will watch your condition. °· Will get help if you are not doing well or get worse. °Document  Released: 01/30/2004 Document Revised: 09/03/2011 Document Reviewed: 06/24/2011 °Exit[employer]® Patient Information ©2014 [employer]. ° °

## 2013-07-06 NOTE — ED Provider Notes (Signed)
CSN: [id number]     Arrival date & time 07/06/13  1221 History   First MD Initiated Contact with Patient 07/06/13 1322     Chief Complaint  Patient presents with  . Pelvic Pain  . Back Pain    HPI Comments: The pain is more on the left side.  It will move down to the legs.  Pt is on birth control.  No menses since starting that about a year ago.  Patient is a 23 y.o. female presenting with pelvic pain and back pain. The history is provided by the patient.  Pelvic Pain This is a new problem. The current episode started more than 1 week ago. The problem occurs constantly. The problem has not changed since onset.Associated symptoms include abdominal pain. Pertinent negatives include no chest pain, no headaches and no shortness of breath. Exacerbated by: not worse with urination or movement. Nothing (she has tried the percocet, but the pain persists) relieves the symptoms.  Back Pain Associated symptoms: abdominal pain and pelvic pain   Associated symptoms: no chest pain, no fever and no headaches   Pt was seen earlier this month.  She had a CT scan and a pelvic US that demonstrated an ovarian cyst.  [facility]  Pt feels that the pain is different this time.  Past Medical History  Diagnosis Date  . UTI (lower urinary tract infection)   . Headache(784.0)   . Gallstones   . Hypertension     GHTN with last pg  . Pregnancy induced hypertension    Past Surgical History  Procedure Laterality Date  . Cholecystectomy N/A 07/27/2012    Procedure: LAPAROSCOPIC CHOLECYSTECTOMY WITH INTRAOPERATIVE CHOLANGIOGRAM;  Surgeon: Madilyn Hook, DO;  Location: [facility];  Service: General;  Laterality: N/A;  . Appendectomy    . Laparoscopic appendectomy N/A 12/09/2012    Procedure: APPENDECTOMY LAPAROSCOPIC;  Surgeon: Gwenyth Ober, MD;  Location: [facility] OR;  Service: General;  Laterality: N/A;   Family History  Problem Relation Age of Onset  . Asthma Mother   . Hypertension  Mother   . Parkinsonism Mother   . Diabetes Maternal Grandmother   . Hypertension Maternal Grandmother   . Stroke Maternal Grandmother   . Anesthesia problems Neg Hx   . Malignant hyperthermia Neg Hx   . Pseudochol deficiency Neg Hx   . Hypotension Neg Hx   . Asthma Brother    History  Substance Use Topics  . Smoking status: Never Smoker   . Smokeless tobacco: Never Used  . Alcohol Use: 0.0 oz/week     Comment: social   OB History   Grav Para Term Preterm Abortions TAB SAB Ect Mult Living   2 2 1 1  0 0 0 0 0 2     Review of Systems  Constitutional: Negative for fever.  Respiratory: Negative for shortness of breath.   Cardiovascular: Negative for chest pain.  Gastrointestinal: Positive for vomiting, abdominal pain and constipation. Negative for diarrhea.  Genitourinary: Positive for pelvic pain.  Musculoskeletal: Positive for back pain.  Neurological: Negative for headaches.  All other systems reviewed and are negative.     Allergies  Ambien; Amoxicillin; Dilaudid; and Iohexol  Home Medications   Prior to Admission medications   Medication Sig Start Date End Date Taking? Authorizing Provider  diphenhydrAMINE (BENADRYL) 25 MG tablet Take 25 mg by mouth daily as needed for allergies.    Yes Historical Provider, MD  ibuprofen (ADVIL,MOTRIN) 800 MG  tablet Take 800 mg by mouth every 8 (eight) hours as needed for moderate pain.   Yes Historical Provider, MD  levonorgestrel (MIRENA) 20 MCG/24HR IUD 1 each by Intrauterine route once. Implanted end of May 2014   Yes Historical Provider, MD  omeprazole (PRILOSEC) 20 MG capsule Take 1 capsule (20 mg total) by mouth daily. 06/21/13  Yes Hoy Morn, MD  ondansetron (ZOFRAN ODT) 4 MG disintegrating tablet Take 1 tablet (4 mg total) by mouth every 8 (eight) hours as needed for nausea or vomiting. 05/05/13  Yes Antonietta Breach, PA-C  oxyCODONE-acetaminophen (PERCOCET/ROXICET) 5-325 MG per tablet Take 1 tablet by mouth every 4 (four) hours  as needed for severe pain. 06/21/13  Yes Hoy Morn, MD   BP 101/50  Pulse 77  Temp(Src) 98.6 F (37 C) (Oral)  Resp 14  Ht 5' (1.524 m)  Wt 226 lb 6 oz (102.683 kg)  BMI 44.21 kg/m2  SpO2 100% Physical Exam  Nursing note and vitals reviewed. Constitutional: She appears well-developed and well-nourished. No distress.  HENT:  Head: Normocephalic and atraumatic.  Right Ear: External ear normal.  Left Ear: External ear normal.  Eyes: Conjunctivae are normal. Right eye exhibits no discharge. Left eye exhibits no discharge. No scleral icterus.  Neck: Neck supple. No tracheal deviation present.  Cardiovascular: Normal rate, regular rhythm and intact distal pulses.   Pulmonary/Chest: Effort normal and breath sounds normal. No stridor. No respiratory distress. She has no wheezes. She has no rales.  Abdominal: Soft. Bowel sounds are normal. She exhibits no distension. There is no tenderness. There is no rebound and no guarding.  Genitourinary: Vagina normal. There is no rash on the right labia. There is no rash on the left labia. Uterus is tender (mild). Cervix exhibits discharge (small amount of yellow white discharge). Cervix exhibits no motion tenderness and no friability. Right adnexum displays no mass, no tenderness and no fullness. Left adnexum displays tenderness. Left adnexum displays no mass and no fullness.  Musculoskeletal: She exhibits no edema and no tenderness.  Neurological: She is alert. She has normal strength. No cranial nerve deficit (no facial droop, extraocular movements intact, no slurred speech) or sensory deficit. She exhibits normal muscle tone. She displays no seizure activity. Coordination normal.  Skin: Skin is warm and dry. No rash noted.  Psychiatric: She has a normal mood and affect.    ED Course  Procedures (including critical care time) Labs Review Labs Reviewed  WET PREP, GENITAL - Abnormal; Notable for the following:    WBC, Wet Prep HPF POC MANY (*)     All other components within normal limits  URINALYSIS, ROUTINE W REFLEX MICROSCOPIC - Abnormal; Notable for the following:    APPearance CLOUDY (*)    All other components within normal limits  GC/CHLAMYDIA PROBE AMP  COMPREHENSIVE METABOLIC PANEL  CBC  RPR  HIV ANTIBODY (ROUTINE TESTING)  POC URINE PREG, ED    MDM   Final diagnoses:  Pelvic pain   Previous GC Chlam cultures were negative.  Previously treated with diflucan and flagyl.  Rocephin and azithromycin given on the 7th.   Previous CT scan and pelvic exam earlier this month.  Recommend follow up with a GYN doctor.  At this time there does not appear to be any evidence of an acute emergency medical condition and the patient appears stable for discharge with appropriate outpatient follow up.     Kathalene Frames, MD 07/06/13 [phone]

## 2013-07-07 LAB — GC/CHLAMYDIA PROBE AMP
CT Probe RNA: NEGATIVE
GC Probe RNA: NEGATIVE

## 2013-07-07 LAB — HIV ANTIBODY (ROUTINE TESTING W REFLEX): HIV 1&2 Ab, 4th Generation: NONREACTIVE

## 2013-07-19 ENCOUNTER — Encounter (HOSPITAL_COMMUNITY): Payer: Self-pay | Admitting: *Deleted

## 2013-07-19 ENCOUNTER — Inpatient Hospital Stay (HOSPITAL_COMMUNITY)
Admission: AD | Admit: 2013-07-19 | Discharge: 2013-07-20 | Disposition: A | Discharge status: Home / Self Care | Payer: Self-pay | Source: Ambulatory Visit | Attending: Obstetrics & Gynecology | Admitting: Obstetrics & Gynecology

## 2013-07-19 DIAGNOSIS — R109 Unspecified abdominal pain: Secondary | ICD-10-CM | POA: Insufficient documentation

## 2013-07-19 LAB — URINALYSIS, ROUTINE W REFLEX MICROSCOPIC
Bilirubin Urine: NEGATIVE
Glucose, UA: NEGATIVE mg/dL
Hgb urine dipstick: NEGATIVE
Ketones, ur: NEGATIVE mg/dL
Leukocytes, UA: NEGATIVE
Nitrite: NEGATIVE
Protein, ur: NEGATIVE mg/dL
Specific Gravity, Urine: 1.01 (ref 1.005–1.030)
Urobilinogen, UA: 0.2 mg/dL (ref 0.0–1.0)
pH: 7.5 (ref 5.0–8.0)

## 2013-07-19 LAB — URINE MICROSCOPIC-ADD ON

## 2013-07-19 LAB — POCT PREGNANCY, URINE: Preg Test, Ur: NEGATIVE

## 2013-07-19 MED ORDER — GI COCKTAIL ~~LOC~~
30.0000 mL | Freq: Once | ORAL | Status: AC
  Administered 2013-07-20: 30 mL via ORAL
  Filled 2013-07-19: qty 30

## 2013-07-19 NOTE — MAU Provider Note (Signed)
History     CSN: [id number]  Arrival date and time: 07/19/13 2229   First Provider Initiated Contact with Patient 07/19/13 2320      Chief Complaint  Patient presents with  . Abdominal Pain  . Emesis   HPI  Emma Chambers is a 23 y.o. [id number] who presents today with abdominal pain x 4 days. She states that she has had her gallbladder and her appendix removed in the past. She denies any fever, diarrhea or close contacts who have been sick. She had a CT scan for abdominal pain on 06/21/13. That was normal, except for a 4 cm simple cyst. She states that the pain is generalized "all over" her entire abdomen. She states that she vomited Saturday, Sunday and today. She states that she vomited about 2 times each day. She states that she does not have a PCP. She rates her pain 8/10 currently. She states that she last had  BM on Friday and it was normal.   Past Medical History  Diagnosis Date  . UTI (lower urinary tract infection)   . Headache(784.0)   . Gallstones   . Hypertension     GHTN with last pg  . Pregnancy induced hypertension     Past Surgical History  Procedure Laterality Date  . Cholecystectomy N/A 07/27/2012    Procedure: LAPAROSCOPIC CHOLECYSTECTOMY WITH INTRAOPERATIVE CHOLANGIOGRAM;  Surgeon: Madilyn Hook, DO;  Location: [facility];  Service: General;  Laterality: N/A;  . Appendectomy    . Laparoscopic appendectomy N/A 12/09/2012    Procedure: APPENDECTOMY LAPAROSCOPIC;  Surgeon: Gwenyth Ober, MD;  Location: [facility] OR;  Service: General;  Laterality: N/A;    Family History  Problem Relation Age of Onset  . Asthma Mother   . Hypertension Mother   . Parkinsonism Mother   . Diabetes Maternal Grandmother   . Hypertension Maternal Grandmother   . Stroke Maternal Grandmother   . Anesthesia problems Neg Hx   . Malignant hyperthermia Neg Hx   . Pseudochol deficiency Neg Hx   . Hypotension Neg Hx   . Asthma Brother     History  Substance Use Topics  . Smoking status:  Never Smoker   . Smokeless tobacco: Never Used  . Alcohol Use: 0.0 oz/week     Comment: social    Allergies:  Allergies  Allergen Reactions  . Ambien [Zolpidem Tartrate] Other (See Comments)    Caused her to have hallucinations  . Amoxicillin Nausea And Vomiting and Other (See Comments)    Tics & twitches  . Dilaudid [Hydromorphone Hcl] Itching  . Iohexol Hives and Itching    Prescriptions prior to admission  Medication Sig Dispense Refill  . diphenhydrAMINE (BENADRYL) 25 MG tablet Take 25 mg by mouth daily as needed for allergies.       Marland Kitchen etodolac (LODINE) 500 MG tablet Take 1 tablet (500 mg total) by mouth 2 (two) times daily.  20 tablet  0  . levonorgestrel (MIRENA) 20 MCG/24HR IUD 1 each by Intrauterine route once. Implanted end of May 2014      . oxyCODONE-acetaminophen (PERCOCET/ROXICET) 5-325 MG per tablet Take 1 tablet by mouth every 4 (four) hours as needed for severe pain.  20 tablet  0  . omeprazole (PRILOSEC) 20 MG capsule Take 1 capsule (20 mg total) by mouth daily.  30 capsule  0  . ondansetron (ZOFRAN ODT) 4 MG disintegrating tablet Take 1 tablet (4 mg total) by mouth every 8 (eight) hours as needed for nausea or  vomiting.  10 tablet  0    ROS Physical Exam   Blood pressure 125/71, pulse 90, temperature 99.1 F (37.3 C), temperature source Oral, resp. rate 16, height 5' (1.524 m), weight 101.152 kg (223 lb), SpO2 100.00%.  Physical Exam  Nursing note and vitals reviewed. Constitutional: She is oriented to person, place, and time. She appears well-developed and well-nourished. No distress.  Cardiovascular: Normal rate.   Respiratory: Effort normal.  GI: Soft. There is no tenderness. There is no rebound.  Neurological: She is alert and oriented to person, place, and time.  Skin: Skin is warm and dry.  Psychiatric: She has a normal mood and affect.    MAU Course  Procedures  Results for orders placed during the hospital encounter of 07/19/13 (from the past  24 hour(s))  URINALYSIS, ROUTINE W REFLEX MICROSCOPIC     Status: Abnormal   Collection Time    07/19/13 11:03 PM      Result Value Ref Range   Color, Urine YELLOW  YELLOW   APPearance TURBID (*) CLEAR   Specific Gravity, Urine 1.010  1.005 - 1.030   pH 7.5  5.0 - 8.0   Glucose, UA NEGATIVE  NEGATIVE mg/dL   Hgb urine dipstick NEGATIVE  NEGATIVE   Bilirubin Urine NEGATIVE  NEGATIVE   Ketones, ur NEGATIVE  NEGATIVE mg/dL   Protein, ur NEGATIVE  NEGATIVE mg/dL   Urobilinogen, UA 0.2  0.0 - 1.0 mg/dL   Nitrite NEGATIVE  NEGATIVE   Leukocytes, UA NEGATIVE  NEGATIVE  URINE MICROSCOPIC-ADD ON     Status: Abnormal   Collection Time    07/19/13 11:03 PM      Result Value Ref Range   Squamous Epithelial / LPF RARE  RARE   WBC, UA 0-2  <3 WBC/hpf   RBC / HPF 0-2  <3 RBC/hpf   Bacteria, UA FEW (*) RARE   Urine-Other AMORPHOUS URATES/PHOSPHATES    POCT PREGNANCY, URINE     Status: None   Collection Time    07/19/13 11:10 PM      Result Value Ref Range   Preg Test, Ur NEGATIVE  NEGATIVE  CBC     Status: Abnormal   Collection Time    07/19/13 11:50 PM      Result Value Ref Range   WBC 9.4  4.0 - 10.5 K/uL   RBC 4.44  3.87 - 5.11 MIL/uL   Hemoglobin 11.7 (*) 12.0 - 15.0 g/dL   HCT 36.3  36.0 - 46.0 %   MCV 81.8  78.0 - 100.0 fL   MCH 26.4  26.0 - 34.0 pg   MCHC 32.2  30.0 - 36.0 g/dL   RDW 14.9  11.5 - 15.5 %   Platelets 307  150 - 400 K/uL  COMPREHENSIVE METABOLIC PANEL     Status: Abnormal   Collection Time    07/19/13 11:50 PM      Result Value Ref Range   Sodium 143  137 - 147 mEq/L   Potassium 3.7  3.7 - 5.3 mEq/L   Chloride 104  96 - 112 mEq/L   CO2 26  19 - 32 mEq/L   Glucose, Bld 95  70 - 99 mg/dL   BUN 11  6 - 23 mg/dL   Creatinine, Ser 0.72  0.50 - 1.10 mg/dL   Calcium 9.8  8.4 - 10.5 mg/dL   Total Protein 6.8  6.0 - 8.3 g/dL   Albumin 3.8  3.5 - 5.2 g/dL  AST 13  0 - 37 U/L   ALT 10  0 - 35 U/L   Alkaline Phosphatase 91  39 - 117 U/L   Total Bilirubin <0.2  (*) 0.3 - 1.2 mg/dL   GFR calc non Af Amer >90  >90 mL/min   GFR calc Af Amer >90  >90 mL/min  AMYLASE     Status: None   Collection Time    07/19/13 11:50 PM      Result Value Ref Range   Amylase 36  0 - 105 U/L  LIPASE, BLOOD     Status: None   Collection Time    07/19/13 11:50 PM      Result Value Ref Range   Lipase 29  11 - 59 U/L     0100: Patient has had GI cocktail and ibuprofen as well as PO fluids while here so far. She has not had any emesis while here.   Assessment and Plan   1. Abdominal pain    Comfort measures reviewed Patient has RX for percocet at home, may take as needed Return to [facility] ED is sx worsen  Follow-up Information   Follow up with [place] ED. (If symptoms worsen)    Contact information:   [place] [id number]        Heather Donovan Hogan 07/20/2013, 1:58 AM

## 2013-07-19 NOTE — MAU Note (Signed)
Pt reports generalized abd pain for the last 4 days, vomiting x 3 days. Denies dysuria.

## 2013-07-20 DIAGNOSIS — R109 Unspecified abdominal pain: Secondary | ICD-10-CM

## 2013-07-20 LAB — AMYLASE: Amylase: 36 U/L (ref 0–105)

## 2013-07-20 LAB — CBC
HCT: 36.3 % (ref 36.0–46.0)
Hemoglobin: 11.7 g/dL — ABNORMAL LOW (ref 12.0–15.0)
MCH: 26.4 pg (ref 26.0–34.0)
MCHC: 32.2 g/dL (ref 30.0–36.0)
MCV: 81.8 fL (ref 78.0–100.0)
Platelets: 307 10*3/uL (ref 150–400)
RBC: 4.44 MIL/uL (ref 3.87–5.11)
RDW: 14.9 % (ref 11.5–15.5)
WBC: 9.4 10*3/uL (ref 4.0–10.5)

## 2013-07-20 LAB — COMPREHENSIVE METABOLIC PANEL
ALT: 10 U/L (ref 0–35)
AST: 13 U/L (ref 0–37)
Albumin: 3.8 g/dL (ref 3.5–5.2)
Alkaline Phosphatase: 91 U/L (ref 39–117)
BUN: 11 mg/dL (ref 6–23)
CO2: 26 mEq/L (ref 19–32)
Calcium: 9.8 mg/dL (ref 8.4–10.5)
Chloride: 104 mEq/L (ref 96–112)
Creatinine, Ser: 0.72 mg/dL (ref 0.50–1.10)
GFR calc Af Amer: >90 mL/min (ref >90)
GFR calc non Af Amer: >90 mL/min (ref >90)
Glucose, Bld: 95 mg/dL (ref 70–99)
Potassium: 3.7 mEq/L (ref 3.7–5.3)
Sodium: 143 mEq/L (ref 137–147)
Total Bilirubin: <0.2 mg/dL — ABNORMAL LOW (ref 0.3–1.2)
Total Protein: 6.8 g/dL (ref 6.0–8.3)

## 2013-07-20 LAB — LIPASE, BLOOD: Lipase: 29 U/L (ref 11–59)

## 2013-07-20 MED ORDER — IBUPROFEN 800 MG PO TABS
800.0000 mg | ORAL_TABLET | Freq: Once | ORAL | Status: AC
  Administered 2013-07-20: 800 mg via ORAL
  Filled 2013-07-20: qty 1

## 2013-07-20 NOTE — Discharge Instructions (Signed)
Abdominal Pain, Adult °Many things can cause abdominal pain. Usually, abdominal pain is not caused by a disease and will improve without treatment. It can often be observed and treated at home. Your health care provider will do a physical exam and possibly order blood tests and X-rays to help determine the seriousness of your pain. However, in many cases, more time must pass before a clear cause of the pain can be found. Before that point, your health care provider may not know if you need more testing or further treatment. °HOME CARE INSTRUCTIONS  °Monitor your abdominal pain for any changes. The following actions may help to alleviate any discomfort you are experiencing: °· Only take over-the-counter or prescription medicines as directed by your health care provider. °· Do not take laxatives unless directed to do so by your health care provider. °· Try a clear liquid diet (broth, tea, or water) as directed by your health care provider. Slowly move to a bland diet as tolerated. °SEEK MEDICAL CARE IF: °· You have unexplained abdominal pain. °· You have abdominal pain associated with nausea or diarrhea. °· You have pain when you urinate or have a bowel movement. °· You experience abdominal pain that wakes you in the night. °· You have abdominal pain that is worsened or improved by eating food. °· You have abdominal pain that is worsened with eating fatty foods. °SEEK IMMEDIATE MEDICAL CARE IF:  °· Your pain does not go away within 2 hours. °· You have a fever. °· You keep throwing up (vomiting). °· Your pain is felt only in portions of the abdomen, such as the right side or the left lower portion of the abdomen. °· You pass bloody or black tarry stools. °MAKE SURE YOU: °· Understand these instructions.   °· Will watch your condition.   °· Will get help right away if you are not doing well or get worse.   °Document Released: 12/12/2004 Document Revised: 12/23/2012 Document Reviewed: 11/11/2012 °[employer]® Patient  Information ©2014 [employer]. ° °

## 2013-09-09 ENCOUNTER — Encounter (HOSPITAL_COMMUNITY): Payer: Self-pay | Admitting: Emergency Medicine

## 2013-09-09 ENCOUNTER — Emergency Department (HOSPITAL_COMMUNITY): Payer: Self-pay

## 2013-09-09 ENCOUNTER — Emergency Department (HOSPITAL_COMMUNITY)
Admission: EM | Admit: 2013-09-09 | Discharge: 2013-09-09 | Disposition: A | Discharge status: Home / Self Care | Payer: Self-pay | Attending: Emergency Medicine | Admitting: Emergency Medicine

## 2013-09-09 DIAGNOSIS — Z8669 Personal history of other diseases of the nervous system and sense organs: Secondary | ICD-10-CM | POA: Insufficient documentation

## 2013-09-09 DIAGNOSIS — M25469 Effusion, unspecified knee: Secondary | ICD-10-CM | POA: Insufficient documentation

## 2013-09-09 DIAGNOSIS — Z880 Allergy status to penicillin: Secondary | ICD-10-CM | POA: Insufficient documentation

## 2013-09-09 DIAGNOSIS — I10 Essential (primary) hypertension: Secondary | ICD-10-CM | POA: Insufficient documentation

## 2013-09-09 DIAGNOSIS — Z888 Allergy status to other drugs, medicaments and biological substances status: Secondary | ICD-10-CM | POA: Insufficient documentation

## 2013-09-09 DIAGNOSIS — M25561 Pain in right knee: Secondary | ICD-10-CM

## 2013-09-09 DIAGNOSIS — M25461 Effusion, right knee: Secondary | ICD-10-CM

## 2013-09-09 LAB — POC URINE PREG, ED: Preg Test, Ur: NEGATIVE

## 2013-09-09 MED ORDER — IBUPROFEN 400 MG PO TABS
400.0000 mg | ORAL_TABLET | Freq: Once | ORAL | Status: AC
  Administered 2013-09-09: 400 mg via ORAL
  Filled 2013-09-09: qty 1

## 2013-09-09 MED ORDER — IBUPROFEN 600 MG PO TABS: 600.0000 mg | ORAL_TABLET | Freq: Four times a day (QID) | ORAL | Status: DC | PRN

## 2013-09-09 NOTE — ED Notes (Signed)
Pt c/o rt knee pain and swelling. Pt denies any injury to the area. Pt states she put ice to the area and it decreased the pain and swelling but she stands on her feet all day at work. Pt denies any otc pain medication. Pt rates pain 8/10, states it aches. Visible swelling noted to rt knee.

## 2013-09-09 NOTE — ED Provider Notes (Signed)
CSN: [id number]     Arrival date & time 09/09/13  1908 History  This chart was scribed for non-physician practitioner Jamse Mead, PA-C working with Blanchie Dessert, MD by Eston Mould, ED Scribe. This patient was seen in room TR09C/TR09C and the patient's care was started at 11:01 PM .   Chief Complaint  Patient presents with  . Knee Injury   The history is provided by the patient. No language interpreter was used.   HPI Comments: Emma Chambers is a 23 y.o. female who presents to the Emergency Department complaining of ongoing R knee pain and swelling she noticed this morning. She describes the pain as aching when seated and stinging & aching when walking. States the pain stays within her knee but has had tingling to digits today She reports noticing "bruising to R knee today". Pt states she stands for long durations while at work, 8-9/hrs, and states she wears comfortable shoes with Dr. Felicie Morn insoles. She has applied ice, heat and elevated knee today. She denies any recent falls, injuries, redness, recent travels for long periods of time, hx of surgeries, hx of DM or HTN.  Past Medical History  Diagnosis Date  . UTI (lower urinary tract infection)   . Headache(784.0)   . Gallstones   . Hypertension     GHTN with last pg  . Pregnancy induced hypertension    Past Surgical History  Procedure Laterality Date  . Cholecystectomy N/A 07/27/2012    Procedure: LAPAROSCOPIC CHOLECYSTECTOMY WITH INTRAOPERATIVE CHOLANGIOGRAM;  Surgeon: Madilyn Hook, DO;  Location: [facility];  Service: General;  Laterality: N/A;  . Appendectomy    . Laparoscopic appendectomy N/A 12/09/2012    Procedure: APPENDECTOMY LAPAROSCOPIC;  Surgeon: Gwenyth Ober, MD;  Location: [facility] OR;  Service: General;  Laterality: N/A;   Family History  Problem Relation Age of Onset  . Asthma Mother   . Hypertension Mother   . Parkinsonism Mother   . Diabetes Maternal Grandmother   . Hypertension Maternal  Grandmother   . Stroke Maternal Grandmother   . Anesthesia problems Neg Hx   . Malignant hyperthermia Neg Hx   . Pseudochol deficiency Neg Hx   . Hypotension Neg Hx   . Asthma Brother    History  Substance Use Topics  . Smoking status: Never Smoker   . Smokeless tobacco: Never Used  . Alcohol Use: 0.0 oz/week     Comment: social   OB History   Grav Para Term Preterm Abortions TAB SAB Ect Mult Living   2 2 1 1  0 0 0 0 0 2     Review of Systems  Constitutional: Negative for fever and chills.  Musculoskeletal: Positive for arthralgias and myalgias. Negative for gait problem.       Knee pain  Skin: Negative for color change, rash and wound.  Neurological: Negative for weakness and numbness.   Allergies  Ambien; Amoxicillin; Dilaudid; and Iohexol  Home Medications   Prior to Admission medications   Medication Sig Start Date End Date Taking? Authorizing Provider  levonorgestrel (MIRENA) 20 MCG/24HR IUD 1 each by Intrauterine route once. Implanted end of May 2014   Yes Historical Provider, MD  ranitidine (ZANTAC) 150 MG tablet Take 150 mg by mouth daily as needed for heartburn.   Yes Historical Provider, MD  ibuprofen (ADVIL,MOTRIN) 600 MG tablet Take 1 tablet (600 mg total) by mouth every 6 (six) hours as needed. 09/09/13   Nayef College, PA-C   BP 136/86  Pulse 98  Temp(Src) 98.2 F (36.8 C) (Oral)  Resp 18  Ht 5' (1.524 m)  Wt 215 lb (97.523 kg)  BMI 41.99 kg/m2  SpO2 100%  Breastfeeding? No  Physical Exam  Nursing note and vitals reviewed. Constitutional: She appears well-developed and well-nourished. No distress.  HENT:  Head: Normocephalic and atraumatic.  Mouth/Throat: Oropharynx is clear and moist. No oropharyngeal exudate.  Eyes: Conjunctivae and EOM are normal. Pupils are equal, round, and reactive to light. Right eye exhibits no discharge. Left eye exhibits no discharge.  Neck: Normal range of motion. Neck supple. No tracheal deviation present.   Cardiovascular: Normal rate, regular rhythm and normal heart sounds.  Exam reveals no friction rub.   No murmur heard. Pulses:      Radial pulses are 2+ on the right side, and 2+ on the left side.       Dorsalis pedis pulses are 2+ on the right side, and 2+ on the left side.       Posterior tibial pulses are 2+ on the right side, and 2+ on the left side.  Pulmonary/Chest: Effort normal and breath sounds normal. No respiratory distress. She has no wheezes. She has no rales.  Musculoskeletal: Normal range of motion.       Right knee: She exhibits normal range of motion, no swelling, no effusion, no ecchymosis, no deformity, no laceration, no erythema and normal alignment. Tenderness found. Medial joint line tenderness noted.       Legs: Negative deformities, swelling, erythema, inflammation, lesions, sores, ecchymosis identified to the right knee. Negative warmth upon palpation. Mild discomfort upon palpation to the medial aspect and posterior aspect of the right knee. Full flexion extension identified without difficulty. Full range of motion to the right ankle without difficulty. Full range of motion to the digits of right foot without difficulty.  Lymphadenopathy:    She has no cervical adenopathy.  Neurological: She is alert. No cranial nerve deficit. She exhibits normal muscle tone. Coordination normal.  Cranial nerves III-XII grossly intact Strength 5+/5+ to upper and lower extremities bilaterally with resistance applied, equal distribution noted Strength intact to digits of the right foot Sensation intact with differentiation to sharp and dull touch Negative arm drift Fine motor skills intact Heel to knee down shin normal bilaterally Gait proper, proper balance - negative sway, negative drift, negative step-offs  Skin: Skin is warm and dry. No rash noted. She is not diaphoretic. No erythema.  Psychiatric: She has a normal mood and affect. Her behavior is normal. Thought content normal.     ED Course  Procedures (including critical care time) DIAGNOSTIC STUDIES: Oxygen Saturation is 100% on RA, normal by my interpretation.    COORDINATION OF CARE: 11:06 PM-Discussed treatment plan which includes x-rays pending. Pt agreed to plan.   4:27 AM  Results for orders placed during the hospital encounter of 09/09/13  POC URINE PREG, ED      Result Value Ref Range   Preg Test, Ur NEGATIVE  NEGATIVE    Labs Review Labs Reviewed  POC URINE PREG, ED   Imaging Review Dg Knee Complete 4 Views Right  09/09/2013   CLINICAL DATA:  Right knee pain and swelling.  EXAM: RIGHT KNEE - COMPLETE 4+ VIEW  COMPARISON:  11/27/2012  FINDINGS: Trace knee effusion.  No fracture or acute bony findings.  IMPRESSION: 1. Trace knee effusion. If pain persists despite conservative therapy, MRI may be warranted for further characterization.   Electronically Signed   By: Sherryl Barters M.D.  On: 09/09/2013 23:03     EKG Interpretation None     MDM   Final diagnoses:  Right knee pain  Knee effusion, right    Filed Vitals:   09/09/13 1913 09/09/13 2344  BP: 126/86 136/86  Pulse: 111 98  Temp: 98.7 F (37.1 C) 98.2 F (36.8 C)  TempSrc: Oral Oral  Resp: 18 18  Height: 5' (1.524 m)   Weight: 215 lb (97.523 kg)   SpO2: 100% 100%   I personally performed the services described in this documentation, which was scribed in my presence. The recorded information has been reviewed and is accurate.  Urine pregnancy negative. Plain film of right knee noted trace knee effusion with no acute bony abnormalities. Negative focal neurological deficits identified. Pulses palpable and strong-DP and PT bilaterally. Full range of motion to the right lower extremity without difficulty or ataxia. Doubt gout. Doubt septic joint. Doubt ischemia. Doubt compartment syndrome. Suspicion to be possible inflammatory changes secondary to patient being on feet all day. Patient stable, afebrile. Patient not septic  appearing. Discharged patient. Patient placed in knee braces/sleeve for compression and crutches. Discussed with patient to rest, ice, elevate. Discharged patient with anti-inflammatories. Referred to orthopedics. Discussed with patient to closely monitor symptoms and if symptoms are to worsen or change to report back to the ED - strict return instructions given.  Patient agreed to plan of care, understood, all questions answered.   Jamse Mead, PA-C 09/10/13 [phone]

## 2013-09-09 NOTE — ED Notes (Signed)
Pt refused crutches.

## 2013-09-09 NOTE — ED Notes (Signed)
Pt able to ambulate on leg but states she has a burning sensation and it is painful.

## 2013-09-09 NOTE — ED Notes (Signed)
Patients right knee is painful  No swelling noted  Stands at her job

## 2013-09-09 NOTE — Discharge Instructions (Signed)
Please call and set-up an appointment with Dr. Doran Durand, orthopedics Please rest and stay hydrated Please keep knee in sleeve at all times especially when active Please rest, ice, elevate Please avoid any physical or strenuous activity Please take medications as prescribed Please continue to monitor symptoms closely and if symptoms are to worsen or change (fever greater than 101, chills, sweating, chest pain, shortness of breath, difficulty breathing, nausea, vomiting, swelling to the knee, hot to the touch, fall, injury, numbness, tingling, worsening or changes to pain pattern, weakness, hot to the touch) please report back to the ED immediately  Knee Effusion The medical term for having fluid in your knee is effusion. This is often due to an internal derangement of the knee. This means something is wrong inside the knee. Some of the causes of fluid in the knee may be torn cartilage, a torn ligament, or bleeding into the joint from an injury. Your knee is likely more difficult to bend and move. This is often because there is increased pain and pressure in the joint. The time it takes for recovery from a knee effusion depends on different factors, including:   Type of injury.  Your age.  Physical and medical conditions.  Rehabilitation Strategies. How long you will be away from your normal activities will depend on what kind of knee problem you have and how much damage is present. Your knee has two types of cartilage. Articular cartilage covers the bone ends and lets your knee bend and move smoothly. Two menisci, thick pads of cartilage that form a rim inside the joint, help absorb shock and stabilize your knee. Ligaments bind the bones together and support your knee joint. Muscles move the joint, help support your knee, and take stress off the joint itself. CAUSES  Often an effusion in the knee is caused by an injury to one of the menisci. This is often a tear in the cartilage. Recovery after a  meniscus injury depends on how much meniscus is damaged and whether you have damaged other knee tissue. Small tears may heal on their own with conservative treatment. Conservative means rest, limited weight bearing activity and muscle strengthening exercises. Your recovery may take up to 6 weeks.  TREATMENT  Larger tears may require surgery. Meniscus injuries may be treated during arthroscopy. Arthroscopy is a procedure in which your surgeon uses a small telescope like instrument to look in your knee. Your caregiver can make a more accurate diagnosis (learning what is wrong) by performing an arthroscopic procedure. If your injury is on the inner margin of the meniscus, your surgeon may trim the meniscus back to a smooth rim. In other cases your surgeon will try to repair a damaged meniscus with stitches (sutures). This may make rehabilitation take longer, but may provide better long term result by helping your knee keep its shock absorption capabilities. Ligaments which are completely torn usually require surgery for repair. HOME CARE INSTRUCTIONS  Use crutches as instructed.  If a brace is applied, use as directed.  Once you are home, an ice pack applied to your swollen knee may help with discomfort and help decrease swelling.  Keep your knee raised (elevated) when you are not up and around or on crutches.  Only take over-the-counter or prescription medicines for pain, discomfort, or fever as directed by your caregiver.  Your caregivers will help with instructions for rehabilitation of your knee. This often includes strengthening exercises.  You may resume a normal diet and activities as directed. SEEK  MEDICAL CARE IF:   There is increased swelling in your knee.  You notice redness, swelling, or increasing pain in your knee.  An unexplained oral temperature above 102 F (38.9 C) develops. SEEK IMMEDIATE MEDICAL CARE IF:   You develop a rash.  You have difficulty breathing.  You  have any allergic reactions from medications you may have been given.  There is severe pain with any motion of the knee. MAKE SURE YOU:   Understand these instructions.  Will watch your condition.  Will get help right away if you are not doing well or get worse. Document Released: 05/25/2003 Document Revised: 05/27/2011 Document Reviewed: 07/29/2007 [facility] Patient Information 2015 [place], [place]. This information is not intended to replace advice given to you by your health care provider. Make sure you discuss any questions you have with your health care provider.   Emergency Department Resource Guide 1) Find a Doctor and Pay Out of Pocket Although you won't have to find out who is covered by your insurance plan, it is a good idea to ask around and get recommendations. You will then need to call the office and see if the doctor you have chosen will accept you as a new patient and what types of options they offer for patients who are self-pay. Some doctors offer discounts or will set up payment plans for their patients who do not have insurance, but you will need to ask so you aren't surprised when you get to your appointment.  2) Contact Your Local Health Department Not all health departments have doctors that can see patients for sick visits, but many do, so it is worth a call to see if yours does. If you don't know where your local health department is, you can check in your phone book. The CDC also has a tool to help you locate your state's health department, and many state websites also have listings of all of their local health departments.  3) Find a [facility] If your illness is not likely to be very severe or complicated, you may want to try a walk in clinic. These are popping up all over the country in pharmacies, drugstores, and shopping centers. They're usually staffed by nurse practitioners or physician assistants that have been trained to treat common illnesses and  complaints. They're usually fairly quick and inexpensive. However, if you have serious medical issues or chronic medical problems, these are probably not your best option.  No Primary Care Doctor: - Call Health Connect at  [phone] - they can help you locate a primary care doctor that  accepts your insurance, provides certain services, etc. - Physician Referral Service- [phone]  Chronic Pain Problems: Organization         Address  Phone   Notes  [facility]  [phone] Patients need to be referred by their primary care doctor.   Medication Assistance: Organization         Address  Phone   Notes  [facility] Medication [facility] [address] [place], [place] [address] [phone] --Must be a resident of [facility] -- Must have NO insurance coverage whatsoever (no Medicaid/ Medicare, etc.) -- The pt. MUST have a primary care doctor that directs their care regularly and follows them in the community   MedAssist  [phone]   [employer]  [phone]    Agencies that provide inexpensive medical care: Organization         Address  Phone   Notes  [facility]  [phone]   [facility]    [phone]   [facility] [place], [place] [address] [phone]   [place]. [address], [place] [phone]   Planned Parenthood    [phone]   [facility]    [phone]   [facility] and [place] [address], [place] Phone:  [phone], Fax:  [phone] Hours of Operation:  9 am - 6 pm, M-F.  Also accepts Medicaid/Medicare and self-pay.  [facility] for [address], [address], [place] Phone: [phone], Fax: [phone]. Hours of Operation:  8:30 am - 5:30 pm, M-F.  Also accepts Medicaid and self-pay.  [facility] [address] [place] Phone: [phone]   [place], [place], [place] [phone], Ext. [phone] Mondays & Thursdays: 7-9 AM.  First 15 patients are seen on a first come, first serve basis.    [place] Providers:  Organization         Address  Phone   Notes  [facility] [address], [place] [phone] Also accepts self-pay patients.  [facility] [address], [place]  [phone]   [place], [address], [place] [phone]   [facility] [address] [place] [phone]   Lucianne Lei [address], [place]   [phone] Only accepts [place] Access [place] patients after they have their name applied to their card.   Self-Pay (no insurance) in [facility]:  Organization         Address  Phone   Notes  Sickle Cell Patients, [facility] [place] [phone]   [facility] [place] [phone]   [facility] [place]  [place], [place], [place] [phone]   [facility]/Dr. Osei-Bonsu  [address] [place] or [address], [place] [phone] Phone number for both [place] and [place] locations is the same.  [facility] [address] [place] [phone]   [facility] [address] [place] or [address] [phone]   [facility] [address], [place] [phone], phone; [phone], fax Sees patients 1st and 3rd Saturday of every month.  Must not qualify for public or private insurance (i.e. Medicaid, Medicare, [place] Health Choice, Veterans' Benefits)  Household income should be no more than 200% of the poverty level  The clinic cannot treat you if you are pregnant or think you are pregnant  Sexually transmitted diseases are not treated at the clinic.    Dental Care: Organization         Address  Phone  Notes  [facility] Department of [facility] [place] [phone] Accepts children up to age 38 who are enrolled in [place] or [place]; pregnant women with a Medicaid card; and children who have applied for Medicaid or [place] Health Choice, but were declined, whose parents can pay a reduced fee at time of service.  [facility] Department of [facility]  [address], [place] [phone] Accepts children up to age 26 who are enrolled in [place] or [place]; pregnant women with a Medicaid card; and children who have applied for Medicaid or [place] Health Choice, but were declined, whose parents can pay a reduced fee at time of service.  [place] Adult Dental Access PROGRAM  [place] [phone] Patients are seen by appointment only. Walk-ins are not accepted. [place] will see patients 77 years of age and older. Monday - Tuesday (8am-5pm) Most Wednesdays (8:30-5pm) $30 per visit, cash only  [facility] Adult Dental Access PROGRAM  [address], [facility] [phone] Patients are seen by appointment only. Walk-ins are not accepted. [place] will see patients 33 years of age and older. One Wednesday Evening (Monthly: Volunteer Based).  $30 per visit, cash only  [place]  [phone] for adults; Children under age 41, call Graduate Pediatric Dentistry at [phone]. Children aged 33-14, please call [phone] to request a pediatric application.  Dental services are provided in all areas of dental care including fillings, crowns and bridges, complete and partial dentures, implants, gum treatment, root canals, and extractions. Preventive care is  also provided. Treatment is provided to both adults and children. Patients are selected via a lottery and there is often a waiting list.   [facility] [address] [place]  [phone] [email or web address]   [facility] [address] [place], [place] [phone], Ext. [phone] Second and Fourth Thursday of each month, opens at 6:30 AM; Clinic ends at 9 AM.  Patients are seen on a first-come first-served basis, and a limited number are seen during each clinic.   [facility]  [address] Hillard Danker [place], [place] [phone]   Eligibility Requirements You must have lived in [place], [place], or [place] counties for at least the last three months.   You cannot be eligible for state or federal sponsored [employer], including [employer], [place], or [employer].   You generally cannot be eligible for healthcare insurance through your employer.    How to apply: Eligibility screenings are held every Tuesday and Wednesday afternoon from 1:00 pm until 4:00 pm. You do not need an appointment for the interview!  [facility] [address] [place], [place]   [place]  [place] Department  [place]  [phone]    Behavioral Health Resources in the Community: Intensive Outpatient Programs Organization         Address  Phone  Notes  [place]. [address] [place], [place] [phone]   [facility] [address] [place], [place]   ADS: [facility] [address] [place], [place]   [place] [address]  [place], [place] or [phone]   Substance Abuse Resources Organization         Address  Phone  Notes  Alcohol [facility] Drug Services  [phone]   [place]  [phone]   [facility]   Chinita Pester  [phone]   Residential & Outpatient Substance Abuse Program  [phone]   Psychological Services Organization         Address  Phone  Notes  [place]   [place] [address] [place] or [phone]    Mobile Crisis Teams Organization         Address  Phone  Notes  [facility], Mobile Crisis Care Unit  [phone]   [facility]  [address] [place], [place]   [place] [address], [place] [phone]    Self-Help/Support Groups Organization         Address  Phone             Notes  [place]. of [place] - variety of support groups  [place] Call for more information  Narcotics Anonymous (NA), [facility] [address], [employer] [place]  2 meetings at this location   Special educational needs teacher         Address  Phone  Notes  [facility] [place],    [place]  [phone]   [facility]  [address] [place] [id number], [place], [place]   [place], [place] [phone] Admissions: 8am-3pm M-F  Incentives Substance [place] [address]    [place], [place] [phone]   [facility] [address] [place], [place], [place]   [facility] [address]  [place], [place]   [facility] Programs - Intensive Outpatient [address] Kristeen [address], [place], [place]   [facility] ([place] [address].,  [place], [place] [phone] or [phone]   [facility] Services (RTS) [address], [place], [place] Accepts Medicaid  [facility] [place] [address],  [place] [phone]  Substance Abuse/Addiction Treatment   [facility] Organization         Address  Phone  Notes  [facility]  [phone]   Domenic Schwab, PhD [address] [place], [place]   [phone] or [phone]   [address] [place], [place] [phone]   [facility] [address], [place], [place] [phone] Insurance/Medicaid/sponsorship through [facility] and Families [address]                                    [place], [place] [phone] [place] [address], [place] [phone]    Dr. Adele Schilder  [phone]   [facility] Dept. 1) [address] [place] 2) [place] 3)  [place] [address], [place] [phone]  [phone]   [place] [phone] or [phone] (After Hours)

## 2013-09-10 NOTE — ED Provider Notes (Signed)
Medical screening examination/treatment/procedure(s) were performed by non-physician practitioner and as supervising physician I was immediately available for consultation/collaboration.   EKG Interpretation None        Blanchie Dessert, MD 09/10/13 1415

## 2013-09-29 IMAGING — US US PELVIS COMPLETE
1 series · 14 of 25 positions shown · non-contrast
Comparison: 05/29/2012 obstetric ultrasound

CLINICAL DATA: Vaginal delivery 7 days ago with low grade fever.
Bleeding.  Rule out retained products of conception.

TRANSABDOMINAL ULTRASOUND OF PELVIS
TECHNIQUE: Transabdominal ultrasound examination of the pelvis was
performed including evaluation of the uterus, ovaries, adnexal
regions, and pelvic cul-de-sac.

[Series 1: us pelvis complete · 14 of 25 slices shown]
[im 1/25]
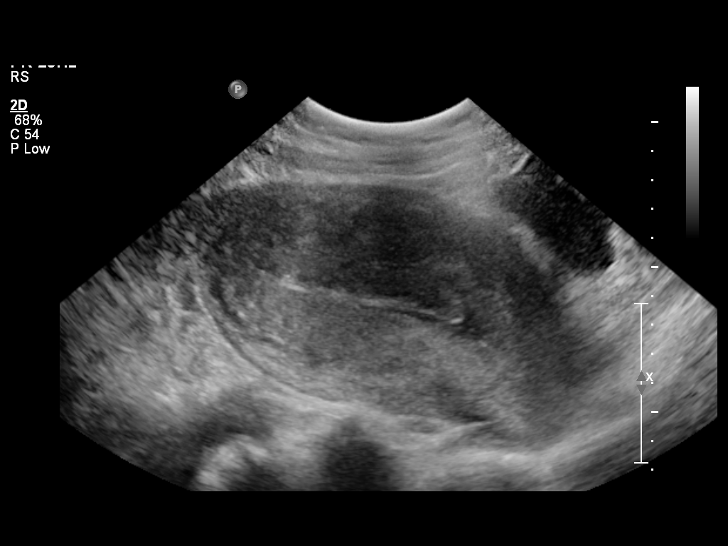
[im 3/25]
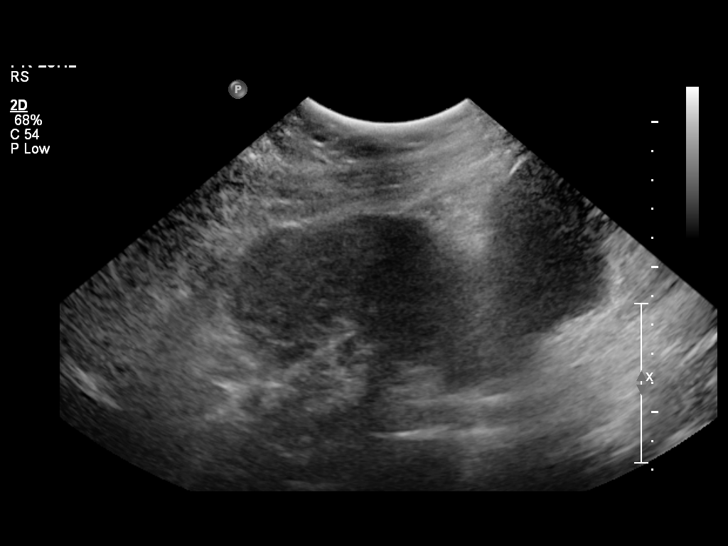
[im 5/25]
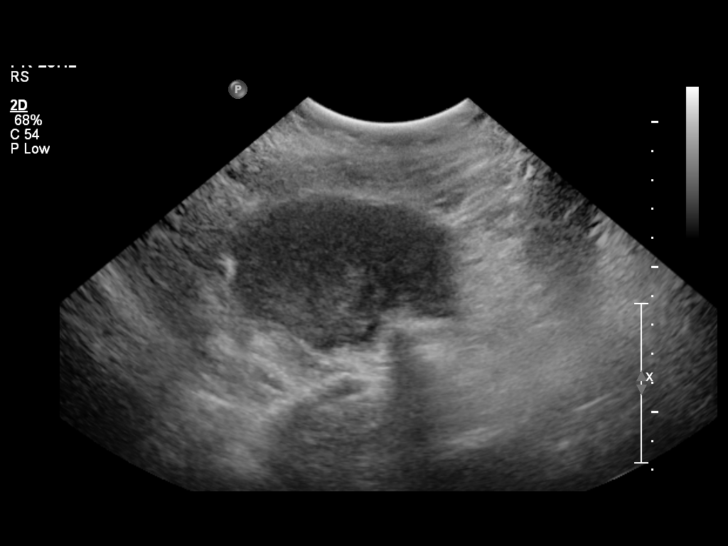
[im 7/25]
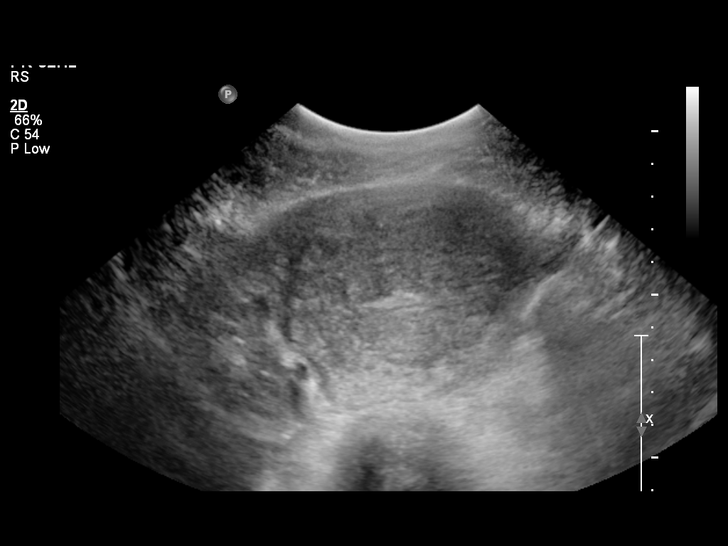
[im 9/25]
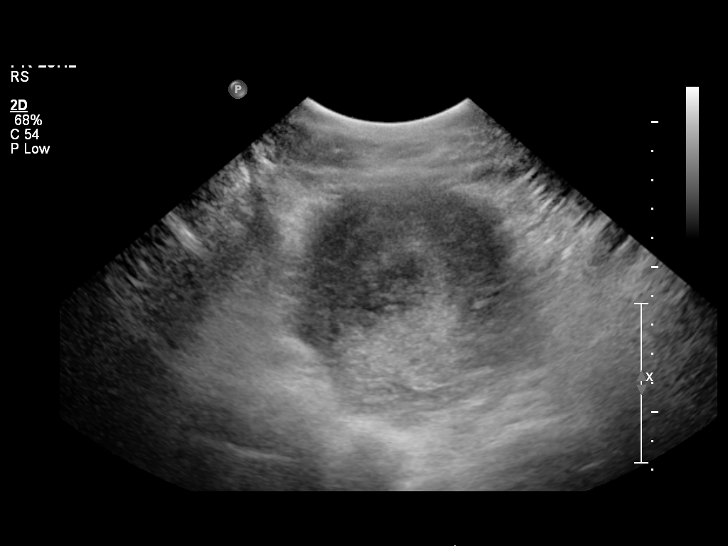
[im 10/25]
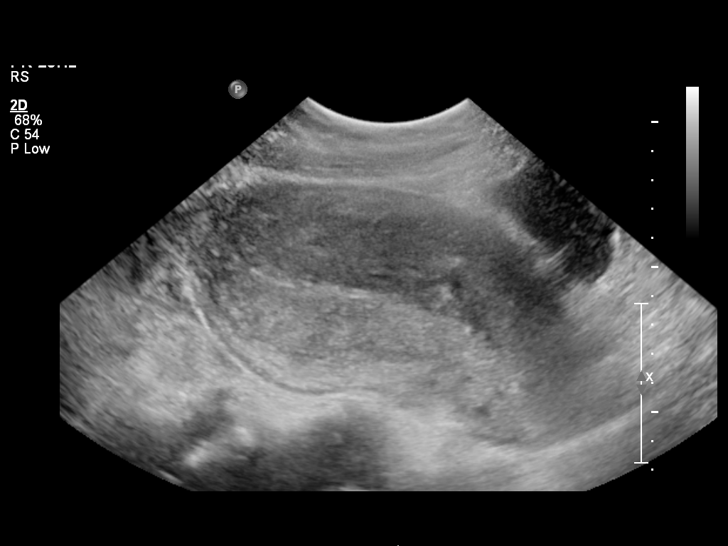
[im 12/25]
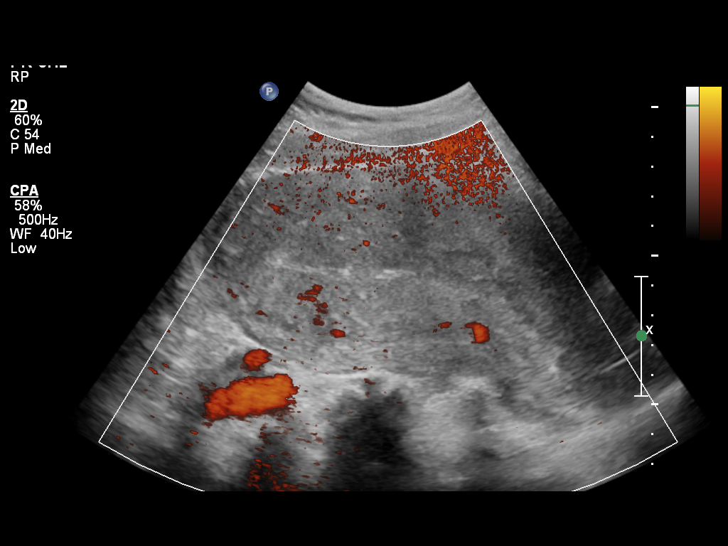
[im 14/25]
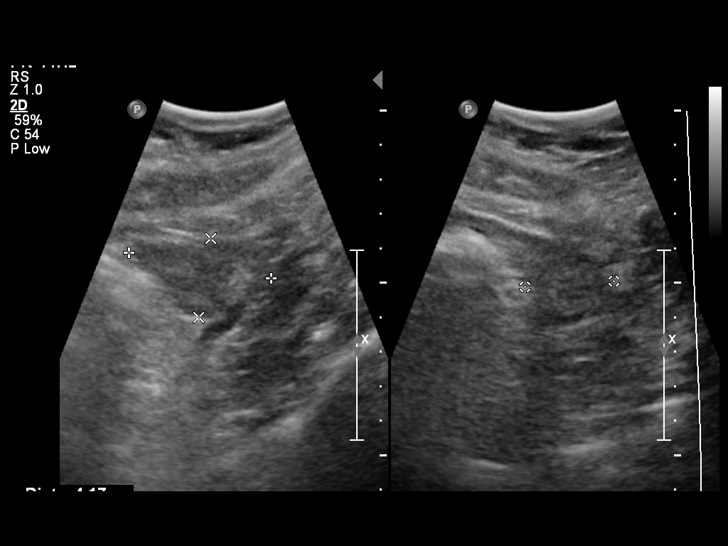
[im 16/25]
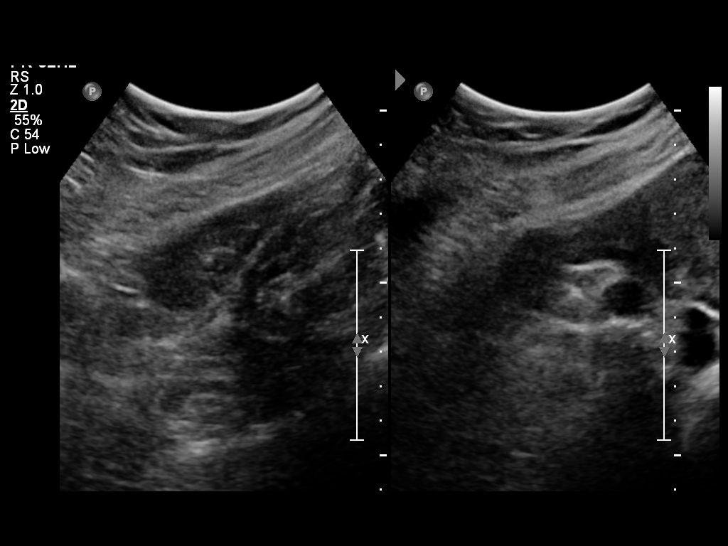
[im 17/25]
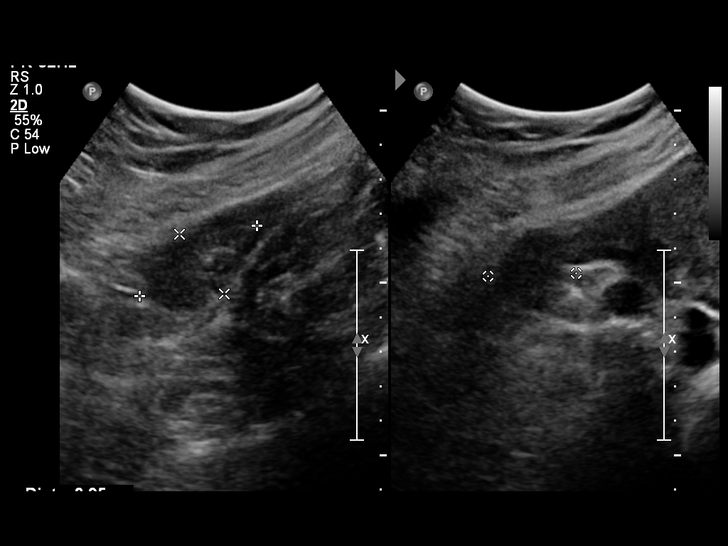
[im 19/25]
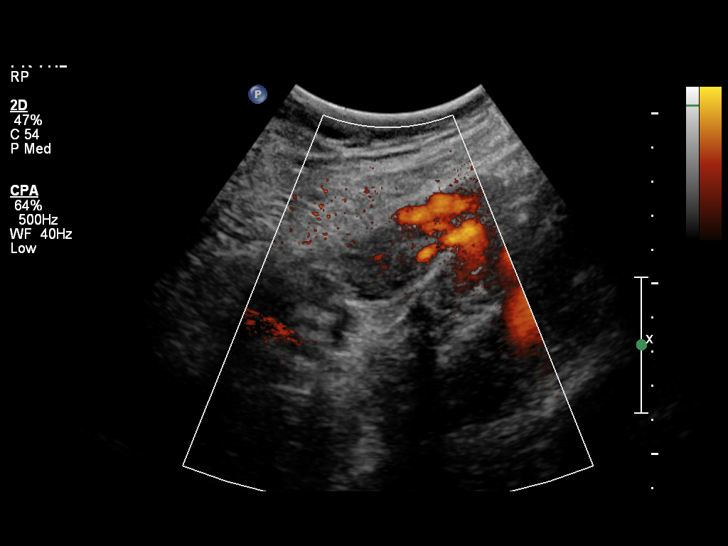
[im 21/25]
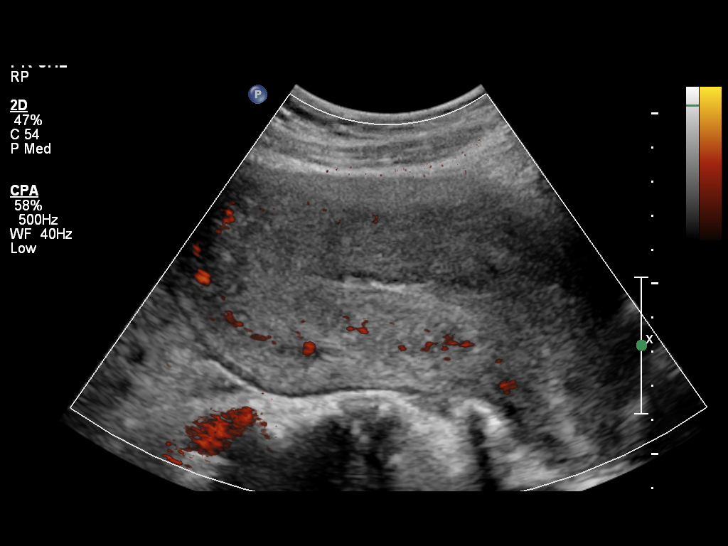
[im 23/25]
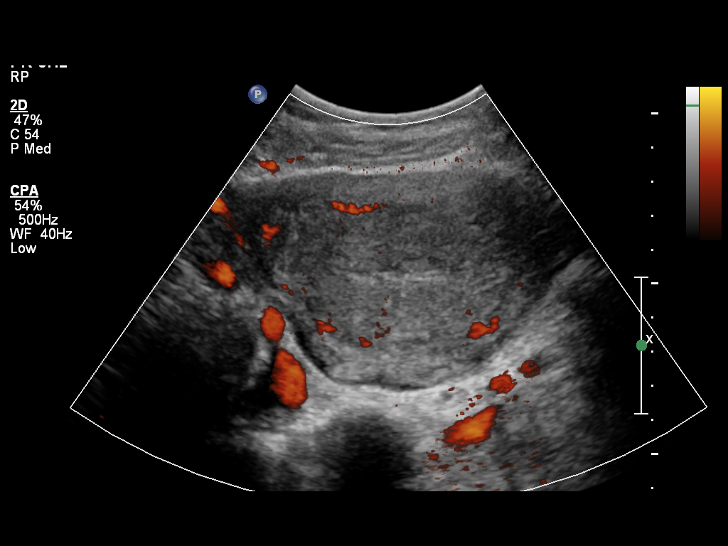
[im 25/25]
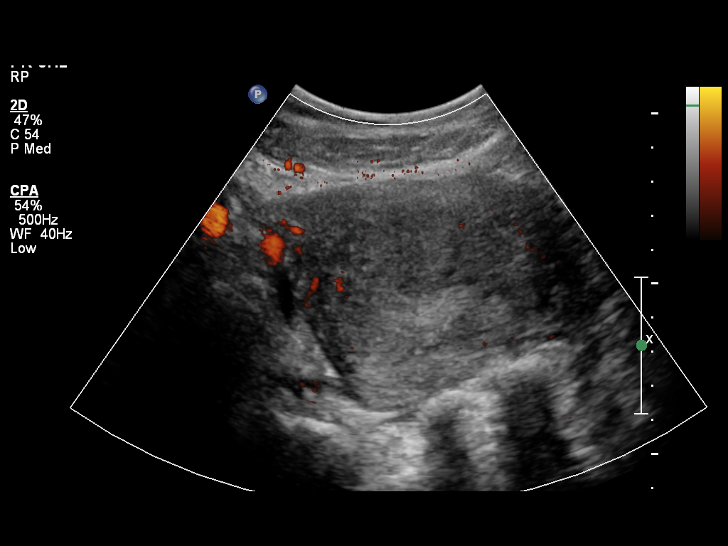

[14 of 25 positions shown; findings below may reference images not displayed]

FINDINGS: Uterus 15.1 x 7.3 x 9.4 cm.  Within normal limits in this
post gravid patient.  Endometrium normal, at 4 - 7 millimeters.  No
evidence of retained products.

Both ovaries are normal in appearance. No significant free fluid.
IMPRESSION: No evidence of retained products of conception.

## 2013-10-20 IMAGING — RF DG CHOLANGIOGRAM OPERATIVE
1 series · 13 of 13 positions shown · non-contrast
Comparison: Abdomen ultrasound dated 05/31/2012

CLINICAL DATA: Cholelithiasis.

INTRAOPERATIVE CHOLANGIOGRAM
TECHNIQUE: Multiple fluoroscopic spot radiographs were obtained
during intraoperative cholangiogram and are submitted for
interpretation post-operatively.

[Series 1: run · 4 acquisitions, 13 frames shown]
[im 1/4]
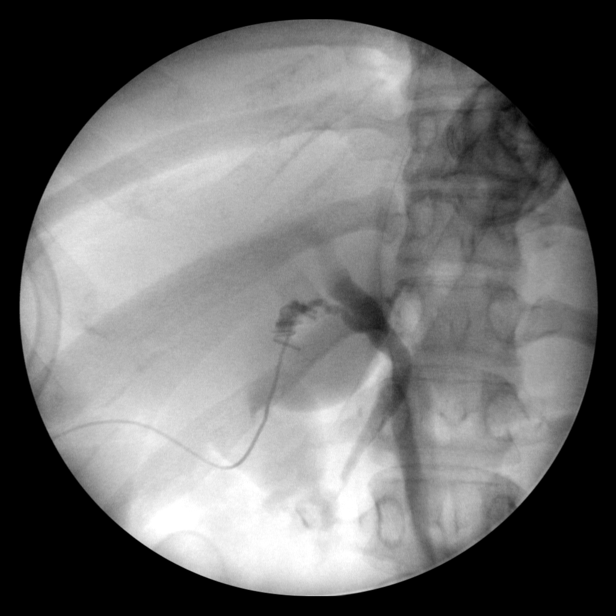
[im 1/4]
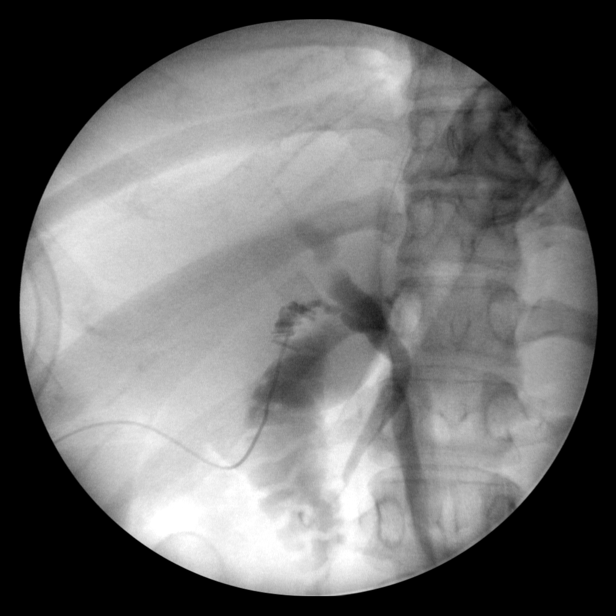
[im 1/4]
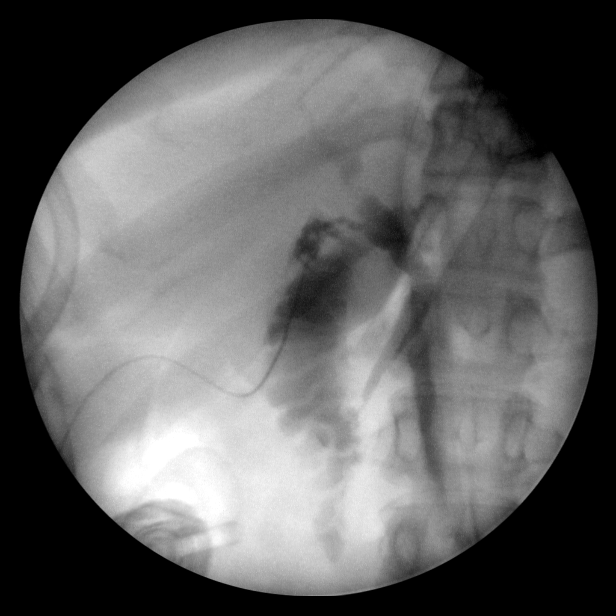
[im 1/4]
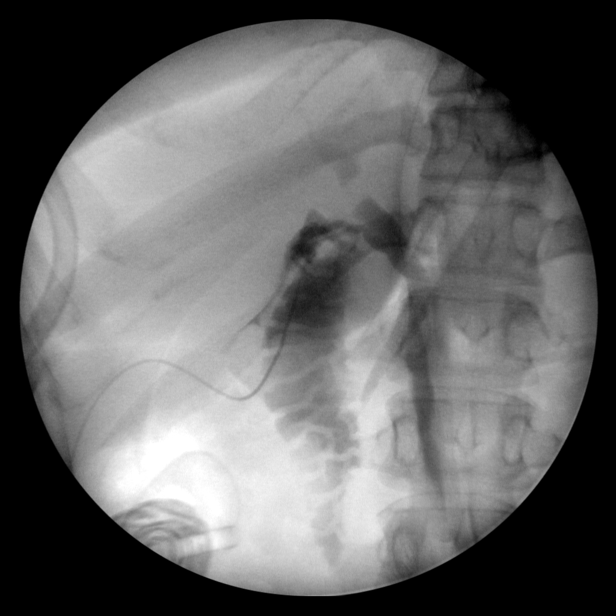
[im 2/4]
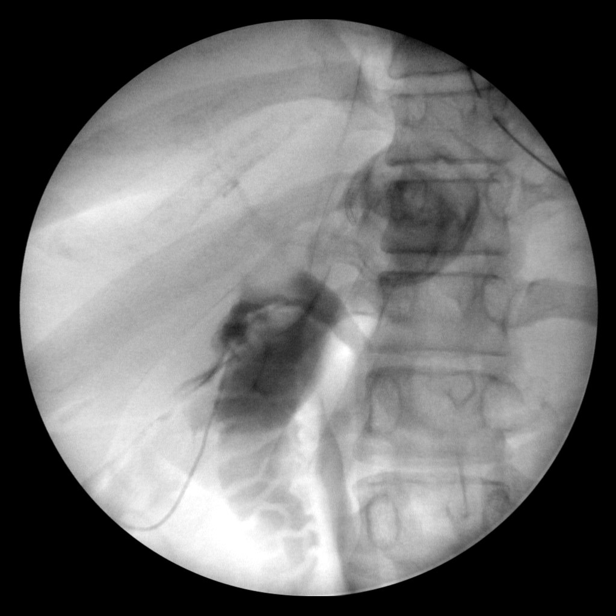
[im 2/4]
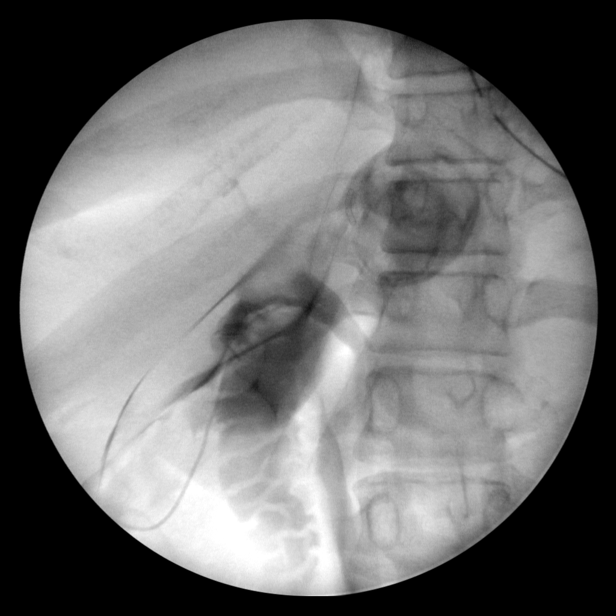
[im 2/4]
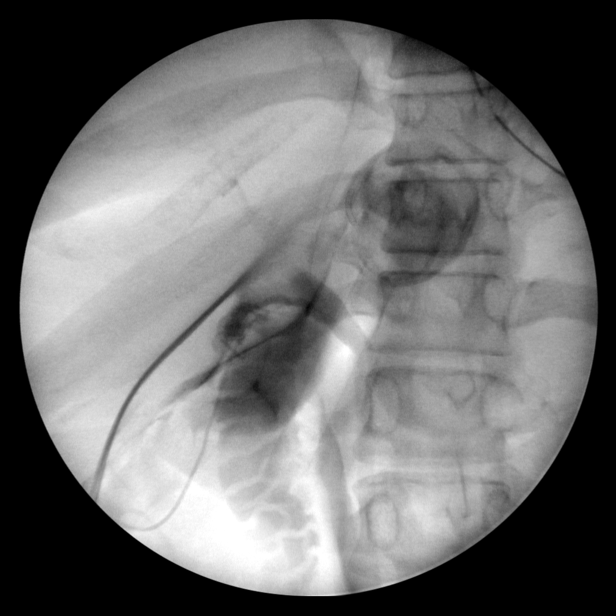
[im 2/4]
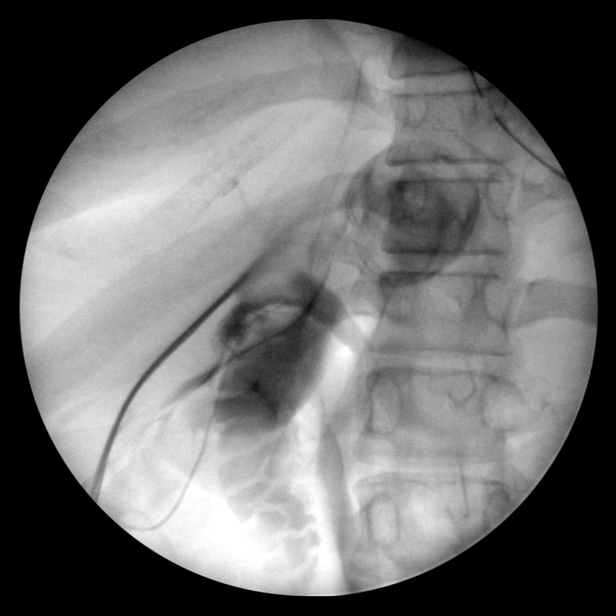
[im 3/4]
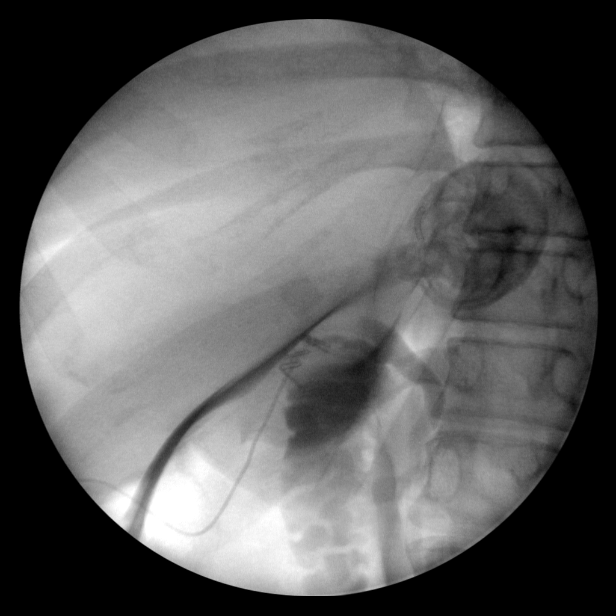
[im 3/4]
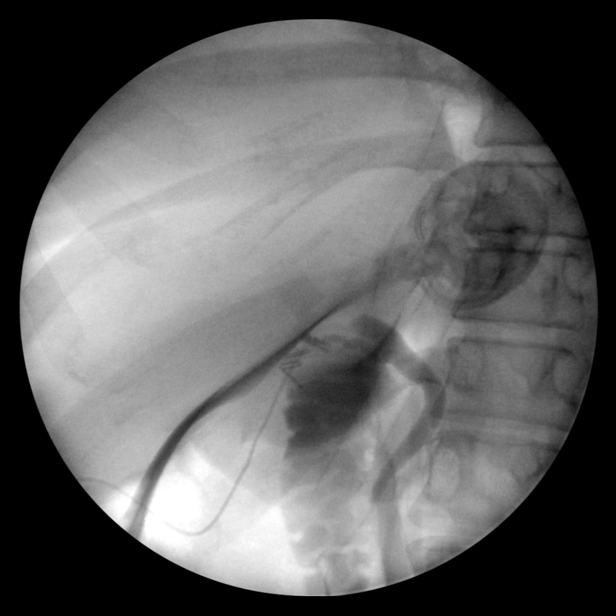
[im 3/4]
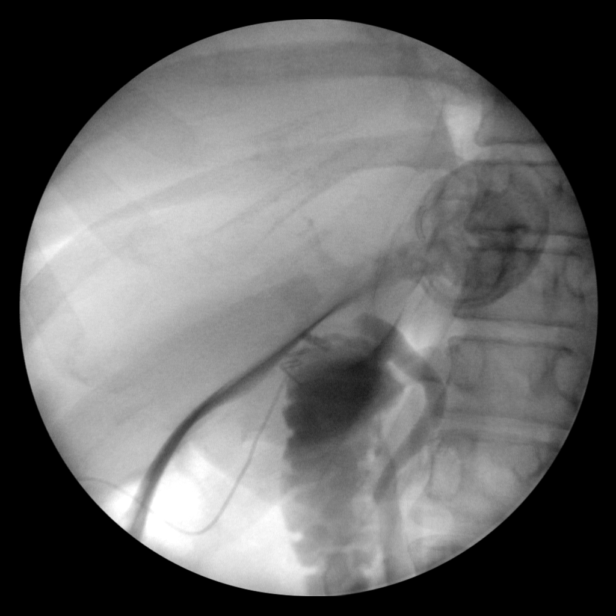
[im 3/4]
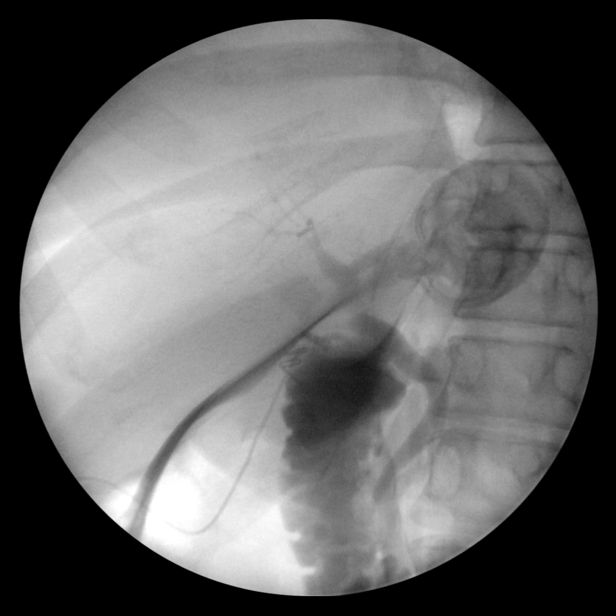
[im 4/4]
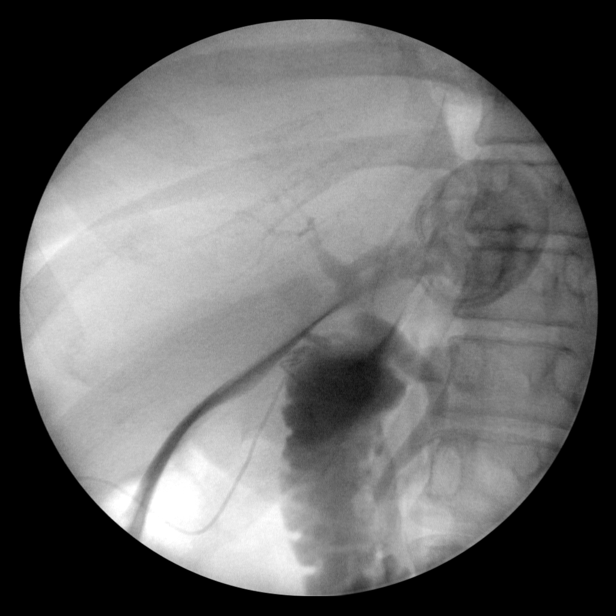

[13 of 13 positions shown; findings below may reference images not displayed]

FINDINGS: There is free flow of contrast through the cystic duct
into the common bile duct and into the duodenum.  There is an area
of compression of the common hepatic duct which appears to be
extrinsic.  There is some extravasation of contrast from the cystic
duct.
IMPRESSION: No significant abnormality on intraoperative cholangiogram.

## 2013-11-17 ENCOUNTER — Encounter (HOSPITAL_COMMUNITY): Payer: Self-pay | Admitting: Emergency Medicine

## 2013-11-17 ENCOUNTER — Emergency Department (HOSPITAL_COMMUNITY)
Admission: EM | Admit: 2013-11-17 | Discharge: 2013-11-18 | Disposition: A | Discharge status: Home / Self Care | Payer: Self-pay | Attending: Emergency Medicine | Admitting: Emergency Medicine

## 2013-11-17 DIAGNOSIS — Z880 Allergy status to penicillin: Secondary | ICD-10-CM | POA: Insufficient documentation

## 2013-11-17 DIAGNOSIS — Z8719 Personal history of other diseases of the digestive system: Secondary | ICD-10-CM | POA: Insufficient documentation

## 2013-11-17 DIAGNOSIS — R51 Headache: Secondary | ICD-10-CM | POA: Insufficient documentation

## 2013-11-17 DIAGNOSIS — E669 Obesity, unspecified: Secondary | ICD-10-CM | POA: Insufficient documentation

## 2013-11-17 DIAGNOSIS — Z87440 Personal history of urinary (tract) infections: Secondary | ICD-10-CM | POA: Insufficient documentation

## 2013-11-17 DIAGNOSIS — I10 Essential (primary) hypertension: Secondary | ICD-10-CM | POA: Insufficient documentation

## 2013-11-17 DIAGNOSIS — R079 Chest pain, unspecified: Secondary | ICD-10-CM | POA: Insufficient documentation

## 2013-11-17 DIAGNOSIS — G44219 Episodic tension-type headache, not intractable: Secondary | ICD-10-CM | POA: Insufficient documentation

## 2013-11-17 DIAGNOSIS — R112 Nausea with vomiting, unspecified: Secondary | ICD-10-CM | POA: Insufficient documentation

## 2013-11-17 LAB — I-STAT TROPONIN, ED: Troponin i, poc: 0 ng/mL (ref 0.00–0.08)

## 2013-11-17 LAB — CBC WITH DIFFERENTIAL/PLATELET
Basophils Absolute: 0 10*3/uL (ref 0.0–0.1)
Basophils Relative: 0 % (ref 0–1)
Eosinophils Absolute: 0.6 10*3/uL (ref 0.0–0.7)
Eosinophils Relative: 6 % — ABNORMAL HIGH (ref 0–5)
HCT: 37.9 % (ref 36.0–46.0)
Hemoglobin: 12.6 g/dL (ref 12.0–15.0)
Lymphocytes Relative: 32 % (ref 12–46)
Lymphs Abs: 3.3 10*3/uL (ref 0.7–4.0)
MCH: 27.3 pg (ref 26.0–34.0)
MCHC: 33.2 g/dL (ref 30.0–36.0)
MCV: 82 fL (ref 78.0–100.0)
Monocytes Absolute: 0.4 10*3/uL (ref 0.1–1.0)
Monocytes Relative: 4 % (ref 3–12)
Neutro Abs: 5.9 10*3/uL (ref 1.7–7.7)
Neutrophils Relative %: 58 % (ref 43–77)
Platelets: 375 10*3/uL (ref 150–400)
RBC: 4.62 MIL/uL (ref 3.87–5.11)
RDW: 14.6 % (ref 11.5–15.5)
WBC: 10.3 10*3/uL (ref 4.0–10.5)

## 2013-11-17 LAB — I-STAT CHEM 8, ED
BUN: 9 mg/dL (ref 6–23)
Calcium, Ion: 1.22 mmol/L (ref 1.12–1.23)
Chloride: 104 mEq/L (ref 96–112)
Creatinine, Ser: 0.7 mg/dL (ref 0.50–1.10)
Glucose, Bld: 88 mg/dL (ref 70–99)
HCT: 42 % (ref 36.0–46.0)
Hemoglobin: 14.3 g/dL (ref 12.0–15.0)
Potassium: 3.9 mEq/L (ref 3.7–5.3)
Sodium: 139 mEq/L (ref 137–147)
TCO2: 26 mmol/L (ref 0–100)

## 2013-11-17 MED ORDER — SODIUM CHLORIDE 0.9 % IV BOLUS (SEPSIS)
1000.0000 mL | Freq: Once | INTRAVENOUS | Status: AC
  Administered 2013-11-17: 1000 mL via INTRAVENOUS

## 2013-11-17 MED ORDER — DIPHENHYDRAMINE HCL 50 MG/ML IJ SOLN
12.5000 mg | Freq: Once | INTRAMUSCULAR | Status: AC
  Administered 2013-11-17: 12.5 mg via INTRAVENOUS
  Filled 2013-11-17: qty 1

## 2013-11-17 MED ORDER — LORAZEPAM 2 MG/ML IJ SOLN
0.5000 mg | Freq: Once | INTRAMUSCULAR | Status: AC
  Administered 2013-11-17: 0.5 mg via INTRAVENOUS
  Filled 2013-11-17: qty 1

## 2013-11-17 MED ORDER — ONDANSETRON 4 MG PO TBDP: 4.0000 mg | ORAL_TABLET | Freq: Three times a day (TID) | ORAL | Status: DC | PRN

## 2013-11-17 MED ORDER — ONDANSETRON HCL 4 MG/2ML IJ SOLN
4.0000 mg | Freq: Once | INTRAMUSCULAR | Status: AC
  Administered 2013-11-17: 4 mg via INTRAVENOUS
  Filled 2013-11-17: qty 2

## 2013-11-17 MED ORDER — METOCLOPRAMIDE HCL 5 MG/ML IJ SOLN
10.0000 mg | Freq: Once | INTRAMUSCULAR | Status: AC
  Administered 2013-11-17: 10 mg via INTRAVENOUS
  Filled 2013-11-17: qty 2

## 2013-11-17 MED ORDER — KETOROLAC TROMETHAMINE 30 MG/ML IJ SOLN
30.0000 mg | Freq: Once | INTRAMUSCULAR | Status: AC
  Administered 2013-11-17: 30 mg via INTRAVENOUS
  Filled 2013-11-17: qty 1

## 2013-11-17 NOTE — ED Provider Notes (Signed)
CSN: [id number]     Arrival date & time 11/17/13  2051 History   First MD Initiated Contact with Patient 11/17/13 2133     Chief Complaint  Patient presents with  . Headache     (Consider location/radiation/quality/duration/timing/severity/associated sxs/prior Treatment) HPI Comments: Patient with generalized headache for the past month  Wakes in the AM with HA and gradually gets better throughout the day  Has been taking Tylenol with some relief.  Denies URI, UTI symptoms. No neck pain, trauma, photophobia.  Recently started school for cosmetology Has has nausea and vomiting for the past 3 days   While in the ED developed a tight sensation mid chest  Patient is a 23 y.o. female presenting with headaches. The history is provided by the patient.  Headache Pain location:  Generalized Quality:  Dull Radiates to:  Does not radiate Severity currently:  3/10 Severity at highest:  6/10 Duration:  4 weeks Timing:  Intermittent Progression:  Unchanged Chronicity:  New Similar to prior headaches: yes   Context: not activity, not exposure to bright light, not caffeine, not coughing, not defecating, not eating, not stress, not exposure to cold air, not intercourse, not loud noise and not straining   Relieved by:  Acetaminophen Worsened by:  Nothing tried Ineffective treatments:  Acetaminophen Associated symptoms: nausea and vomiting   Associated symptoms: no back pain, no blurred vision, no congestion, no cough, no diarrhea, no dizziness, no facial pain, no fatigue, no fever, no loss of balance, no neck pain, no neck stiffness, no numbness, no paresthesias, no photophobia, no sinus pressure, no sore throat, no swollen glands and no URI   Associated symptoms comment:  Nausea and vomiting for the past 3 days    Past Medical History  Diagnosis Date  . UTI (lower urinary tract infection)   . Headache(784.0)   . Gallstones   . Hypertension     GHTN with last pg  . Pregnancy induced  hypertension    Past Surgical History  Procedure Laterality Date  . Cholecystectomy N/A 07/27/2012    Procedure: LAPAROSCOPIC CHOLECYSTECTOMY WITH INTRAOPERATIVE CHOLANGIOGRAM;  Surgeon: Madilyn Hook, DO;  Location: [facility];  Service: General;  Laterality: N/A;  . Appendectomy    . Laparoscopic appendectomy N/A 12/09/2012    Procedure: APPENDECTOMY LAPAROSCOPIC;  Surgeon: Gwenyth Ober, MD;  Location: [facility];  Service: General;  Laterality: N/A;   Family History  Problem Relation Age of Onset  . Asthma Mother   . Hypertension Mother   . Parkinsonism Mother   . Diabetes Maternal Grandmother   . Hypertension Maternal Grandmother   . Stroke Maternal Grandmother   . Anesthesia problems Neg Hx   . Malignant hyperthermia Neg Hx   . Pseudochol deficiency Neg Hx   . Hypotension Neg Hx   . Asthma Brother    History  Substance Use Topics  . Smoking status: Never Smoker   . Smokeless tobacco: Never Used  . Alcohol Use: 0.0 oz/week     Comment: social   OB History   Grav Para Term Preterm Abortions TAB SAB Ect Mult Living   2 2 1 1  0 0 0 0 0 2     Review of Systems  Constitutional: Negative for fever and fatigue.  HENT: Negative for congestion, rhinorrhea, sinus pressure and sore throat.   Eyes: Negative for blurred vision and photophobia.  Respiratory: Negative for cough.   Cardiovascular: Positive for chest pain.  Gastrointestinal: Positive for nausea and vomiting. Negative for diarrhea.  Endocrine: Negative for polydipsia and polyphagia.  Musculoskeletal: Negative for back pain, neck pain and neck stiffness.  Skin: Negative for rash and wound.  Neurological: Positive for headaches. Negative for dizziness, weakness, numbness, paresthesias and loss of balance.  All other systems reviewed and are negative.     Allergies  Amoxicillin; Dilaudid; and Iohexol  Home Medications   Prior to Admission medications   Medication Sig Start Date End Date Taking? Authorizing Provider   acetaminophen (TYLENOL) 500 MG tablet Take 1,000 mg by mouth every 6 (six) hours as needed for headache.   Yes Historical Provider, MD  aspirin-acetaminophen-caffeine (EXCEDRIN MIGRAINE) [phone] MG per tablet Take 1 tablet by mouth every 6 (six) hours as needed for headache.   Yes Historical Provider, MD  levonorgestrel (MIRENA) 20 MCG/24HR IUD 1 each by Intrauterine route once. Implanted end of May 2014   Yes Historical Provider, MD  ranitidine (ZANTAC) 150 MG tablet Take 150 mg by mouth daily as needed for heartburn.   Yes Historical Provider, MD  ondansetron (ZOFRAN ODT) 4 MG disintegrating tablet Take 1 tablet (4 mg total) by mouth every 8 (eight) hours as needed for nausea or vomiting. 11/17/13   Garald Balding, NP   BP 109/70  Pulse 90  Temp(Src) 98.7 F (37.1 C) (Oral)  Resp 15  Ht 5' (1.524 m)  SpO2 100% Physical Exam  Constitutional: She is oriented to person, place, and time. She appears well-developed and well-nourished.  obese  HENT:  Head: Normocephalic.  Right Ear: External ear normal.  Left Ear: External ear normal.  Mouth/Throat: Oropharynx is clear and moist.  Eyes: Pupils are equal, round, and reactive to light.  Neck: Normal range of motion. Neck supple.  Cardiovascular: Normal rate and regular rhythm.   Pulmonary/Chest: Effort normal.  Musculoskeletal: Normal range of motion.  Neurological: She is alert and oriented to person, place, and time.  Skin: Skin is warm and dry. No rash noted.    ED Course  Procedures (including critical care time) Labs Review Labs Reviewed  CBC WITH DIFFERENTIAL - Abnormal; Notable for the following:    Eosinophils Relative 6 (*)    All other components within normal limits  URINALYSIS, ROUTINE W REFLEX MICROSCOPIC  I-STAT CHEM 8, ED  I-STAT TROPOININ, ED    Imaging Review No results found.   EKG Interpretation   Date/Time:  Wednesday November 17 2013 21:37:21 EDT Ventricular Rate:  85 PR Interval:  148 QRS  Duration: 65 QT Interval:  358 QTC Calculation: 426 R Axis:   50 Text Interpretation:  Sinus rhythm Baseline wander When compared with ECG  of 02/14/2013 No significant change was found Confirmed by [facility]  MD,  Nunzio Cory [phone]) on 11/17/2013 9:42:14 PM      MDM   Final diagnoses:  Episodic tension-type headache, not intractable  Non-intractable vomiting with nausea, vomiting of unspecified type     Still having nausea, pain will give aditional IV fluids as well as Benadryl and Reglan  After being give above medications announced she was ready to go home despite no resolution of her headache and urine results pending     Garald Balding, NP 11/18/13 0001

## 2013-11-17 NOTE — ED Notes (Signed)
Pt in c/o headache for a month, states pain will improve at times and then get worse, pain is more consistent recently, also n /v with this, no distress noted

## 2013-11-17 NOTE — Discharge Instructions (Signed)
General Headache Without Cause A general headache is pain or discomfort felt around the head or neck area. The cause may not be found.  HOME CARE   Keep all doctor visits.  Only take medicines as told by your doctor.  Lie down in a dark, quiet room when you have a headache.  Keep a journal to find out if certain things bring on headaches. For example, write down:  What you eat and drink.  How much sleep you get.  Any change to your diet or medicines.  Relax by getting a massage or doing other relaxing activities.  Put ice or heat packs on the head and neck area as told by your doctor.  Lessen stress.  Sit up straight. Do not tighten (tense) your muscles.  Quit smoking if you smoke.  Lessen how much alcohol you drink.  Lessen how much caffeine you drink, or stop drinking caffeine.  Eat and sleep on a regular schedule.  Get 7 to 9 hours of sleep, or as told by your doctor.  Keep lights dim if bright lights bother you or make your headaches worse. GET HELP RIGHT AWAY IF:   Your headache becomes really bad.  You have a fever.  You have a stiff neck.  You have trouble seeing.  Your muscles are weak, or you lose muscle control.  You lose your balance or have trouble walking.  You feel like you will pass out (faint), or you pass out.  You have really bad symptoms that are different than your first symptoms.  You have problems with the medicines given to you by your doctor.  Your medicines do not work.  Your headache feels different than the other headaches.  You feel sick to your stomach (nauseous) or throw up (vomit). MAKE SURE YOU:   Understand these instructions.  Will watch your condition.  Will get help right away if you are not doing well or get worse. Document Released: 12/12/2007 Document Revised: 05/27/2011 Document Reviewed: 02/22/2011 [facility] Patient Information 2015 [place], [place]. This information is not intended to replace advice given to  you by your health care provider. Make sure you discuss any questions you have with your health care provider.  Emergency Department Resource Guide 1) Find a Doctor and Pay Out of Pocket Although you won't have to find out who is covered by your insurance plan, it is a good idea to ask around and get recommendations. You will then need to call the office and see if the doctor you have chosen will accept you as a new patient and what types of options they offer for patients who are self-pay. Some doctors offer discounts or will set up payment plans for their patients who do not have insurance, but you will need to ask so you aren't surprised when you get to your appointment.  2) Contact Your Local Health Department Not all health departments have doctors that can see patients for sick visits, but many do, so it is worth a call to see if yours does. If you don't know where your local health department is, you can check in your phone book. The CDC also has a tool to help you locate your state's health department, and many state websites also have listings of all of their local health departments.  3) Find a [facility] If your illness is not likely to be very severe or complicated, you may want to try a walk in clinic. These are popping up all over the  country in pharmacies, drugstores, and shopping centers. They're usually staffed by nurse practitioners or physician assistants that have been trained to treat common illnesses and complaints. They're usually fairly quick and inexpensive. However, if you have serious medical issues or chronic medical problems, these are probably not your best option.  No Primary Care Doctor: - Call Health Connect at  [phone] - they can help you locate a primary care doctor that  accepts your insurance, provides certain services, etc. - Physician Referral Service- [phone]  Chronic Pain Problems: Organization         Address  Phone   Notes  [facility]  [phone] Patients need to be referred by their primary care doctor.   Medication Assistance: Organization         Address  Phone   Notes  [facility] Medication [facility] [address] [place], [place] [address] [phone] --Must be a resident of [facility] -- Must have NO insurance coverage whatsoever (no Medicaid/ Medicare, etc.) -- The pt. MUST have a primary care doctor that directs their care regularly and follows them in the community   MedAssist  [phone]   [employer]  [phone]    Agencies that provide inexpensive medical care: Organization         Address  Phone   Notes  [place]  [phone]   [facility]    [phone]   [facility] [place], [place] [address] [phone]   [place] [address] [place] [phone]   Planned Parenthood    [phone]   [facility]    [phone]   [place] and [place] [address], [place] Phone:  [phone], Fax:  [phone] Hours of Operation:  9 am - 6 pm, M-F.  Also accepts Medicaid/Medicare and self-pay.  [facility] for [address], [address], [place] Phone: [phone], Fax: [phone]. Hours of Operation:  8:30 am - 5:30 pm, M-F.  Also accepts Medicaid and self-pay.  [facility] [place] [address], [place] Phone: [phone]   [place], [place], [place] [phone], Ext. [phone] Mondays & Thursdays: 7-9 AM.  First 15 patients are seen on a first come, first serve basis.    [place] Providers:  Organization         Address  Phone   Notes  [facility] [address], [place] [phone] Also accepts self-pay patients.  [facility] [address], [place]  [phone]   [place], [address], [place] [phone]   [facility] [address], [place] [phone]   Lucianne Lei [address], [place]   [phone] Only accepts [place] Access [place] patients after they have their name applied to their card.   Self-Pay (no insurance) in [facility]:  Organization         Address  Phone   Notes  Sickle Cell Patients, [facility] [place] [phone]   [facility] [place] [phone]   [facility]gent [place]  [address], [place] [phone]   [facility]/Dr. Osei-Bonsu  [address] [place] or [address], [place] [phone] Phone number for both [place] and [place] locations is the same.  [facility] [address] [place] [phone]   [facility] [address], [place] or [address] [phone]   [facility] [address] [place] [phone], phone; [phone], fax Sees patients 1st and 3rd Saturday of every month.  Must not qualify for public or private insurance (i.e. Medicaid, Medicare, [place] Health Choice, Veterans' Benefits)  Household income should be no more than 200% of the poverty level The clinic cannot treat you if you are pregnant or think you are pregnant  Sexually transmitted diseases are not treated at the clinic.    Dental Care: Organization         Address  Phone  Notes  [facility] Department of [facility] [place] [phone] Accepts children up to age 46 who are enrolled in [place] or [place]; pregnant women with a Medicaid card; and children who have applied for Medicaid or [place]  Health Choice, but were declined, whose parents can pay a reduced fee at time of service.  [facility] Department of [facility]  [address], [place] [phone] Accepts children up to age 89 who are enrolled in [place] or [place]; pregnant women with a Medicaid card; and children who have applied for Medicaid or [place] Health Choice, but were declined, whose parents can pay a reduced fee at time of service.  [place] Adult Dental Access PROGRAM  [place] [phone] Patients are seen by appointment only. Walk-ins are not accepted. [place] will see patients 76 years of age and older. Monday - Tuesday (8am-5pm) Most Wednesdays (8:30-5pm) $30 per visit, cash only  [facility] [place] Adult Dental Access PROGRAM  [address], [facility] [phone] Patients are seen by appointment only. Walk-ins are not accepted. [place] will see patients 72 years of age and older. One Wednesday Evening (Monthly: Volunteer Based).  $30 per visit, cash only  [place]  [phone] for adults; Children under age 33, call Graduate Pediatric Dentistry at [phone]. Children aged 27-14, please call [phone] to request a pediatric application.  Dental services are provided in all areas of dental care including fillings, crowns and bridges, complete and partial dentures, implants, gum treatment, root canals, and extractions. Preventive care is also provided. Treatment is provided to both adults and children. Patients are selected via a lottery and there is often a waiting list.   [facility] [address] [place]  [phone] [email or web address]   [facility] [address] [place], [place] [phone], Ext. [phone] Second and Fourth Thursday of each month, opens at 6:30 AM; Clinic ends at 9 AM.  Patients are seen on a first-come first-served basis, and a limited number are seen during  each clinic.   [facility]  [address] Hillard Danker [place], [place] [phone]   Eligibility Requirements You must have lived in [place], [place], or [place] counties for at least the last three months.   You cannot be eligible for state or federal sponsored [employer], including [employer], [place], or [employer].   You  generally cannot be eligible for healthcare insurance through your employer.    How to apply: Eligibility screenings are held every Tuesday and Wednesday afternoon from 1:00 pm until 4:00 pm. You do not need an appointment for the interview!  [facility] [address] [place], [place]   [place]  [place] Department  [place]  [phone]    Behavioral Health Resources in the Community: Intensive Outpatient Programs Organization         Address  Phone  Notes  [place]. [address], [place], [place] [phone]   [facility] Outpatient [address], [place], [place]   ADS: [facility] [address] [place], [place]   [place] [address]  [place], [place] or [phone]   Substance Abuse Resources Organization         Address  Phone  Notes  Alcohol and Drug Services  [phone]   [place]  [phone]   [facility] [place]   Chinita Pester  [phone]   Residential & Outpatient Substance Abuse Program  [phone]   Psychological Services Organization         Address  Phone  Notes  [facility] [place]  [place]  [phone]   [place] [address] [place] or [phone]    Mobile Crisis Teams Organization         Address  Phone  Notes  [facility]atives, Mobile  Crisis Care Unit  [phone]   [facility]  [address] [place], [place]   [place] [address], [place] [phone]    Self-Help/Support Groups Organization         Address  Phone             Notes  [place]. of [place] - variety of support groups  [place] Call for more information  Narcotics Anonymous (NA), [facility] [address], [employer] [place]  2 meetings at this location   Special educational needs teacher         Address  Phone  Notes  [facility] [place],    [place]  [phone]   [facility]  [address], [place] [address], [place], [place]   [place], [place] [phone] Admissions: 8am-3pm M-F  Incentives Substance [place] [address],    [place], [place] [phone]   [facility] [address] [place], [place], [place]   [facility] [address]  [place], [place] 6   [facility] Programs - Intensive Outpatient [address], [place], [place]   [facility] ([place].,  [place], [place] [phone] or [phone]   [facility] Services (RTS) [address] [place], [place] Accepts Medicaid  [facility] [place] [address]  [place] [phone] Substance Abuse/Addiction Treatment   [facility] Organization         Address  Phone  Notes  [facility]  [phone]   Domenic Schwab, PhD [address] [place], [place]   [phone] or [phone]   [place]  [place], [place] [phone]   [facility] [address] [place], [place] [phone] Insurance/Medicaid/sponsorship through [facility] and Families [address]  [place], [place] [phone] [place], [place] [phone]    Dr. Adele Schilder  [phone]   [facility] Dept. 1) [address] [place] 2) [place] 3)  [place] [address], [place] [phone]  [phone]   [place] [phone] or [phone] (After Hours)

## 2013-11-17 NOTE — ED Notes (Signed)
Pt monitored by pulse ox, bp cuff, and 12-lead. 

## 2013-11-17 NOTE — ED Notes (Signed)
Pt complained of chest tightness, EKG performed.

## 2013-11-17 NOTE — ED Notes (Signed)
Pt placed on monitor upon arrival to room. Pt monitored by blood pressure and pulse ox.  

## 2013-11-18 LAB — URINALYSIS, ROUTINE W REFLEX MICROSCOPIC
Bilirubin Urine: NEGATIVE
Glucose, UA: NEGATIVE mg/dL
Ketones, ur: NEGATIVE mg/dL
Nitrite: NEGATIVE
Protein, ur: NEGATIVE mg/dL
Specific Gravity, Urine: 1.016 (ref 1.005–1.030)
Urobilinogen, UA: 1 mg/dL (ref 0.0–1.0)
pH: 7.5 (ref 5.0–8.0)

## 2013-11-18 LAB — URINE MICROSCOPIC-ADD ON

## 2013-11-19 NOTE — ED Provider Notes (Signed)
Medical screening examination/treatment/procedure(s) were performed by non-physician practitioner and as supervising physician I was immediately available for consultation/collaboration.   EKG Interpretation   Date/Time:  Wednesday November 17 2013 21:37:21 EDT Ventricular Rate:  85 PR Interval:  148 QRS Duration: 65 QT Interval:  358 QTC Calculation: 426 R Axis:   50 Text Interpretation:  Sinus rhythm Baseline wander When compared with ECG  of 02/14/2013 No significant change was found Confirmed by [facility]  MD,  Chimene Salo ([id number]) on 11/17/2013 9:42:14 PM        Francine Graven, DO 11/19/13 2008

## 2014-01-17 ENCOUNTER — Encounter (HOSPITAL_COMMUNITY): Payer: Self-pay | Admitting: Emergency Medicine

## 2014-01-18 ENCOUNTER — Emergency Department (HOSPITAL_COMMUNITY): Payer: Self-pay

## 2014-01-18 ENCOUNTER — Encounter (HOSPITAL_COMMUNITY): Payer: Self-pay | Admitting: Emergency Medicine

## 2014-01-18 ENCOUNTER — Emergency Department (HOSPITAL_COMMUNITY)
Admission: EM | Admit: 2014-01-18 | Discharge: 2014-01-19 | Discharge status: Against Medical Advice | Payer: Self-pay | Attending: Emergency Medicine | Admitting: Emergency Medicine

## 2014-01-18 DIAGNOSIS — Z8719 Personal history of other diseases of the digestive system: Secondary | ICD-10-CM | POA: Insufficient documentation

## 2014-01-18 DIAGNOSIS — E669 Obesity, unspecified: Secondary | ICD-10-CM | POA: Insufficient documentation

## 2014-01-18 DIAGNOSIS — Z9089 Acquired absence of other organs: Secondary | ICD-10-CM | POA: Insufficient documentation

## 2014-01-18 DIAGNOSIS — Z87440 Personal history of urinary (tract) infections: Secondary | ICD-10-CM | POA: Insufficient documentation

## 2014-01-18 DIAGNOSIS — Z880 Allergy status to penicillin: Secondary | ICD-10-CM | POA: Insufficient documentation

## 2014-01-18 DIAGNOSIS — Z9049 Acquired absence of other specified parts of digestive tract: Secondary | ICD-10-CM | POA: Insufficient documentation

## 2014-01-18 DIAGNOSIS — Z3202 Encounter for pregnancy test, result negative: Secondary | ICD-10-CM | POA: Insufficient documentation

## 2014-01-18 DIAGNOSIS — R109 Unspecified abdominal pain: Secondary | ICD-10-CM | POA: Insufficient documentation

## 2014-01-18 DIAGNOSIS — I10 Essential (primary) hypertension: Secondary | ICD-10-CM | POA: Insufficient documentation

## 2014-01-18 DIAGNOSIS — R519 Headache, unspecified: Secondary | ICD-10-CM

## 2014-01-18 DIAGNOSIS — R51 Headache: Secondary | ICD-10-CM | POA: Insufficient documentation

## 2014-01-18 LAB — CBC WITH DIFFERENTIAL/PLATELET
Basophils Absolute: 0 10*3/uL (ref 0.0–0.1)
Basophils Relative: 0 % (ref 0–1)
Eosinophils Absolute: 0.6 10*3/uL (ref 0.0–0.7)
Eosinophils Relative: 7 % — ABNORMAL HIGH (ref 0–5)
HCT: 38.3 % (ref 36.0–46.0)
Hemoglobin: 12.3 g/dL (ref 12.0–15.0)
Lymphocytes Relative: 31 % (ref 12–46)
Lymphs Abs: 2.6 10*3/uL (ref 0.7–4.0)
MCH: 26.6 pg (ref 26.0–34.0)
MCHC: 32.1 g/dL (ref 30.0–36.0)
MCV: 82.7 fL (ref 78.0–100.0)
Monocytes Absolute: 0.5 10*3/uL (ref 0.1–1.0)
Monocytes Relative: 6 % (ref 3–12)
Neutro Abs: 4.7 10*3/uL (ref 1.7–7.7)
Neutrophils Relative %: 56 % (ref 43–77)
Platelets: 340 10*3/uL (ref 150–400)
RBC: 4.63 MIL/uL (ref 3.87–5.11)
RDW: 14.4 % (ref 11.5–15.5)
WBC: 8.4 10*3/uL (ref 4.0–10.5)

## 2014-01-18 LAB — COMPREHENSIVE METABOLIC PANEL
ALT: 8 U/L (ref 0–35)
AST: 15 U/L (ref 0–37)
Albumin: 3.9 g/dL (ref 3.5–5.2)
Alkaline Phosphatase: 78 U/L (ref 39–117)
Anion gap: 14 (ref 5–15)
BUN: 9 mg/dL (ref 6–23)
CO2: 20 mEq/L (ref 19–32)
Calcium: 9.5 mg/dL (ref 8.4–10.5)
Chloride: 107 mEq/L (ref 96–112)
Creatinine, Ser: 0.63 mg/dL (ref 0.50–1.10)
GFR calc Af Amer: >90 mL/min (ref >90)
GFR calc non Af Amer: >90 mL/min (ref >90)
Glucose, Bld: 92 mg/dL (ref 70–99)
Potassium: 4 mEq/L (ref 3.7–5.3)
Sodium: 141 mEq/L (ref 137–147)
Total Bilirubin: 0.2 mg/dL — ABNORMAL LOW (ref 0.3–1.2)
Total Protein: 7.9 g/dL (ref 6.0–8.3)

## 2014-01-18 LAB — URINALYSIS, ROUTINE W REFLEX MICROSCOPIC
Bilirubin Urine: NEGATIVE
Glucose, UA: NEGATIVE mg/dL
Ketones, ur: NEGATIVE mg/dL
Nitrite: NEGATIVE
Protein, ur: NEGATIVE mg/dL
Specific Gravity, Urine: 1.007 (ref 1.005–1.030)
Urobilinogen, UA: 0.2 mg/dL (ref 0.0–1.0)
pH: 8.5 — ABNORMAL HIGH (ref 5.0–8.0)

## 2014-01-18 LAB — WET PREP, GENITAL
Clue Cells Wet Prep HPF POC: NONE SEEN
Trich, Wet Prep: NONE SEEN
WBC, Wet Prep HPF POC: NONE SEEN
Yeast Wet Prep HPF POC: NONE SEEN

## 2014-01-18 LAB — LIPASE, BLOOD: Lipase: 28 U/L (ref 11–59)

## 2014-01-18 LAB — PREGNANCY, URINE: Preg Test, Ur: NEGATIVE

## 2014-01-18 LAB — URINE MICROSCOPIC-ADD ON

## 2014-01-18 MED ORDER — METOCLOPRAMIDE HCL 5 MG/ML IJ SOLN
5.0000 mg | Freq: Once | INTRAMUSCULAR | Status: AC
  Administered 2014-01-18: 5 mg via INTRAVENOUS
  Filled 2014-01-18: qty 2

## 2014-01-18 MED ORDER — LIDOCAINE-EPINEPHRINE 2 %-1:100000 IJ SOLN
20.0000 mL | Freq: Once | INTRAMUSCULAR | Status: DC
  Filled 2014-01-18: qty 1

## 2014-01-18 MED ORDER — HYDROCODONE-ACETAMINOPHEN 5-325 MG PO TABS: 1.0000 | ORAL_TABLET | Freq: Four times a day (QID) | ORAL | Status: DC | PRN

## 2014-01-18 MED ORDER — DIPHENHYDRAMINE HCL 50 MG/ML IJ SOLN
25.0000 mg | Freq: Once | INTRAMUSCULAR | Status: AC
  Administered 2014-01-18: 25 mg via INTRAVENOUS
  Filled 2014-01-18: qty 1

## 2014-01-18 MED ORDER — ONDANSETRON HCL 4 MG PO TABS: 4.0000 mg | ORAL_TABLET | Freq: Three times a day (TID) | ORAL | Status: DC | PRN

## 2014-01-18 MED ORDER — KETOROLAC TROMETHAMINE 30 MG/ML IJ SOLN
30.0000 mg | Freq: Once | INTRAMUSCULAR | Status: AC
  Administered 2014-01-18: 30 mg via INTRAVENOUS
  Filled 2014-01-18: qty 1

## 2014-01-18 MED ORDER — MORPHINE SULFATE 4 MG/ML IJ SOLN
4.0000 mg | Freq: Once | INTRAMUSCULAR | Status: AC
  Administered 2014-01-18: 4 mg via INTRAVENOUS
  Filled 2014-01-18: qty 1

## 2014-01-18 MED ORDER — ONDANSETRON HCL 4 MG/2ML IJ SOLN
4.0000 mg | Freq: Once | INTRAMUSCULAR | Status: AC
  Administered 2014-01-18: 4 mg via INTRAVENOUS
  Filled 2014-01-18: qty 2

## 2014-01-18 MED ORDER — SODIUM CHLORIDE 0.9 % IV BOLUS (SEPSIS)
1000.0000 mL | Freq: Once | INTRAVENOUS | Status: AC
  Administered 2014-01-18: 1000 mL via INTRAVENOUS

## 2014-01-18 NOTE — ED Notes (Signed)
Report received from [place]  Pt came in for abdominal pain and when being discharged she had complaints of headache,  Pt is being treated for both presently

## 2014-01-18 NOTE — ED Notes (Signed)
Pt c/o lower abd pain and left side pain x 2 days, n/v and headache x 3 days. Pt states last night she noticed a moderate amount of blood in vomit. Pt has appendix and gallbladder out.

## 2014-01-18 NOTE — ED Notes (Signed)
MD at bedside. 

## 2014-01-18 NOTE — ED Notes (Signed)
Pelvic is set up 

## 2014-01-18 NOTE — ED Provider Notes (Signed)
CSN: [id number]     Arrival date & time 01/18/14  1238 History   First MD Initiated Contact with Patient 01/18/14 1341     Chief Complaint  Patient presents with  . Abdominal Pain  . Flank Pain     (Consider location/radiation/quality/duration/timing/severity/associated sxs/prior Treatment) HPI  This is a 23 year old female who presents with left flank pain. Also reports headache. Onset of symptoms 2-3 days ago. Regarding headache, it is bitemporal and nonradiating. She rates her pain at 10 out of 10. She denies any vision changes, weakness, numbness, or tingling. She denies abrupt onset. She denies any fevers or neck pain. Patient also reports since yesterday she has been having left flank pain. It is sharp and radiates into the left lower quadrant. She noted hematuria earlier today. Denied dysuria. Endorses nonbilious, nonbloody emesis but no diarrhea.  Past Medical History  Diagnosis Date  . UTI (lower urinary tract infection)   . Headache(784.0)   . Gallstones   . Hypertension     GHTN with last pg  . Pregnancy induced hypertension    Past Surgical History  Procedure Laterality Date  . Cholecystectomy N/A 07/27/2012    Procedure: LAPAROSCOPIC CHOLECYSTECTOMY WITH INTRAOPERATIVE CHOLANGIOGRAM;  Surgeon: Madilyn Hook, DO;  Location: [facility];  Service: General;  Laterality: N/A;  . Appendectomy    . Laparoscopic appendectomy N/A 12/09/2012    Procedure: APPENDECTOMY LAPAROSCOPIC;  Surgeon: Gwenyth Ober, MD;  Location: [facility] OR;  Service: General;  Laterality: N/A;   Family History  Problem Relation Age of Onset  . Asthma Mother   . Hypertension Mother   . Parkinsonism Mother   . Diabetes Maternal Grandmother   . Hypertension Maternal Grandmother   . Stroke Maternal Grandmother   . Anesthesia problems Neg Hx   . Malignant hyperthermia Neg Hx   . Pseudochol deficiency Neg Hx   . Hypotension Neg Hx   . Asthma Brother    History  Substance Use Topics  . Smoking status: Never  Smoker   . Smokeless tobacco: Never Used  . Alcohol Use: 0.0 oz/week     Comment: social   OB History    Gravida Para Term Preterm AB TAB SAB Ectopic Multiple Living   2 2 1 1  0 0 0 0 0 2     Review of Systems  Constitutional: Negative for fever.  Respiratory: Negative for chest tightness and shortness of breath.   Cardiovascular: Negative for chest pain.  Gastrointestinal: Positive for nausea, vomiting and abdominal pain.  Genitourinary: Positive for hematuria and flank pain. Negative for dysuria.  Musculoskeletal: Negative for back pain, neck pain and neck stiffness.  Neurological: Positive for headaches.  All other systems reviewed and are negative.     Allergies  Amoxicillin; Dilaudid; and Iohexol  Home Medications   Prior to Admission medications   Medication Sig Start Date End Date Taking? Authorizing Provider  ibuprofen (ADVIL,MOTRIN) 200 MG tablet Take 200 mg by mouth every 6 (six) hours as needed for mild pain or moderate pain.   Yes Historical Provider, MD  levonorgestrel (MIRENA) 20 MCG/24HR IUD 1 each by Intrauterine route once. Implanted end of May 2014   Yes Historical Provider, MD  acetaminophen (TYLENOL) 500 MG tablet Take 1,000 mg by mouth every 6 (six) hours as needed for headache.    Historical Provider, MD  aspirin-acetaminophen-caffeine (EXCEDRIN MIGRAINE) [phone] MG per tablet Take 1 tablet by mouth every 6 (six) hours as needed for headache.    Historical Provider, MD  ondansetron (ZOFRAN ODT) 4 MG disintegrating tablet Take 1 tablet (4 mg total) by mouth every 8 (eight) hours as needed for nausea or vomiting. 11/17/13   Garald Balding, NP  ranitidine (ZANTAC) 150 MG tablet Take 150 mg by mouth daily as needed for heartburn.    Historical Provider, MD   BP 112/57 mmHg  Pulse 73  Temp(Src) 98.8 F (37.1 C) (Oral)  Resp 21  Ht 5\' 1"  (1.549 m)  SpO2 99% Physical Exam  Constitutional: She is oriented to person, place, and time. She appears  well-developed and well-nourished.  obese  HENT:  Head: Normocephalic and atraumatic.  Mouth/Throat: Oropharynx is clear and moist.  Eyes: EOM are normal. Pupils are equal, round, and reactive to light.  Neck: Normal range of motion. Neck supple.  NO meningismus  Cardiovascular: Normal rate, regular rhythm and normal heart sounds.   Pulmonary/Chest: Effort normal. No respiratory distress. She has no wheezes.  Abdominal: Soft. Bowel sounds are normal. There is no tenderness. There is no rebound and no guarding.  Genitourinary:  No CVA tenderness  Neurological: She is alert and oriented to person, place, and time.  Skin: Skin is warm and dry. No rash noted.  Psychiatric: She has a normal mood and affect.  Nursing note and vitals reviewed.   ED Course  Procedures (including critical care time) Labs Review Labs Reviewed  CBC WITH DIFFERENTIAL - Abnormal; Notable for the following:    Eosinophils Relative 7 (*)    All other components within normal limits  COMPREHENSIVE METABOLIC PANEL - Abnormal; Notable for the following:    Total Bilirubin 0.2 (*)    All other components within normal limits  URINALYSIS, ROUTINE W REFLEX MICROSCOPIC - Abnormal; Notable for the following:    Color, Urine RED (*)    APPearance CLOUDY (*)    pH 8.5 (*)    Hgb urine dipstick LARGE (*)    Leukocytes, UA SMALL (*)    All other components within normal limits  LIPASE, BLOOD  PREGNANCY, URINE  URINE MICROSCOPIC-ADD ON    Imaging Review Ct Renal Stone Study  01/18/2014   CLINICAL DATA:  23 year old female with lower abdominal and left-sided pain for 2 days. Nausea, vomiting and headaches for 3 days. Prior cholecystectomy and appendectomy. Initial encounter.  EXAM: CT ABDOMEN AND PELVIS WITHOUT CONTRAST  TECHNIQUE: Multidetector CT imaging of the abdomen and pelvis was performed following the standard protocol without IV contrast.  COMPARISON:  06/22/2013 pelvic sonogram and 06/21/2013 CT.  FINDINGS:  No extra luminal bowel inflammatory process, free fluid or free air.  No obstructing renal or ureteral calculi or findings of renal collecting system obstruction.  Interval increase in size of the right ovarian cystic structure now spanning up to 5 cm versus prior 4.2 cm. This can be evaluated with pelvic sonogram.  Post cholecystectomy.  Taking into account limitation by non contrast imaging, no worrisome hepatic, splenic, pancreatic, adrenal or renal lesion.  Lung bases clear.  No abdominal aortic aneurysm.  No adenopathy.  IUD is in place.  No gross abnormality of the urinary bladder.  Bilateral sacroiliac joint degenerative changes.  IMPRESSION: No extra luminal bowel inflammatory process, free fluid or free air.  No obstructing renal or ureteral calculi or findings of renal collecting system obstruction.  Interval increase in size of the right ovarian cystic structure now spanning up to 5 cm versus prior 4.2 cm. This can be evaluated with pelvic sonogram.  Post cholecystectomy.  Bilateral sacroiliac joint degenerative  changes.   Electronically Signed   By: Chauncey Cruel M.D.   On: 01/18/2014 15:36     EKG Interpretation None      MDM   Final diagnoses:  Flank pain  Nonintractable headache, unspecified chronicity pattern, unspecified headache type    Patient presents with multiple complaints including headache and left-sided flank pain with associated vomiting. Is nontoxic and nonfocal on exam.  No evidence of fever or meningismus suggestive of infection regarding headache. Headache is been ongoing for the last 2-3 days and patient denies abrupt onset. Regarding patient's flank pain and vomiting, history suspicious for kidney stones. No known history. Will obtain basic lab work. Patient was given fluids, morphine, and Zofran for pain.  Urinalysis shows large hemoglobin. Will obtain CT scan to rule out renal stone. CT scan is negative for stone. Patient does have a right ovarian cyst which does not  correspond with patient's current complaints.  Patient has a nontender abdomen.  3:30pm On reexam, patient still complaining of headache. She remains nonfocal.  We'll give migraine cocktail including Toradol, Reglan, and Benadryl.  Signed out to Dr. Colin Rhein for recheck after headache cocktail.   Merryl Hacker, MD 01/19/14 [phone]

## 2014-01-18 NOTE — Discharge Instructions (Signed)
You were seen today for flank pain and headache.  Your workup was unremarkable including a CT scan. You'll be discharged home with pain medication and nausea medication. See return precautions below.  Your CT scan does show a right ovarian cyst.  If you develop pain over the right abdomen, you may need follow-up with a ultrasound.  General Headache Without Cause A headache is pain or discomfort felt around the head or neck area. The specific cause of a headache may not be found. There are many causes and types of headaches. A few common ones are:  Tension headaches.  Migraine headaches.  Cluster headaches.  Chronic daily headaches. HOME CARE INSTRUCTIONS   Keep all follow-up appointments with your caregiver or any specialist referral.  Only take over-the-counter or prescription medicines for pain or discomfort as directed by your caregiver.  Lie down in a dark, quiet room when you have a headache.  Keep a headache journal to find out what may trigger your migraine headaches. For example, write down:  What you eat and drink.  How much sleep you get.  Any change to your diet or medicines.  Try massage or other relaxation techniques.  Put ice packs or heat on the head and neck. Use these 3 to 4 times per day for 15 to 20 minutes each time, or as needed.  Limit stress.  Sit up straight, and do not tense your muscles.  Quit smoking if you smoke.  Limit alcohol use.  Decrease the amount of caffeine you drink, or stop drinking caffeine.  Eat and sleep on a regular schedule.  Get 7 to 9 hours of sleep, or as recommended by your caregiver.  Keep lights dim if bright lights bother you and make your headaches worse. SEEK MEDICAL CARE IF:   You have problems with the medicines you were prescribed.  Your medicines are not working.  You have a change from the usual headache.  You have nausea or vomiting. SEEK IMMEDIATE MEDICAL CARE IF:   Your headache becomes  severe.  You have a fever.  You have a stiff neck.  You have loss of vision.  You have muscular weakness or loss of muscle control.  You start losing your balance or have trouble walking.  You feel faint or pass out.  You have severe symptoms that are different from your first symptoms. MAKE SURE YOU:   Understand these instructions.  Will watch your condition.  Will get help right away if you are not doing well or get worse. Document Released: 03/04/2005 Document Revised: 05/27/2011 Document Reviewed: 03/20/2011 [facility] Patient Information 2015 [place], [place]. This information is not intended to replace advice given to you by your health care provider. Make sure you discuss any questions you have with your health care provider. Flank Pain Flank pain refers to pain that is located on the side of the body between the upper abdomen and the back. The pain may occur over a short period of time (acute) or may be long-term or reoccurring (chronic). It may be mild or severe. Flank pain can be caused by many things. CAUSES  Some of the more common causes of flank pain include:  Muscle strains.   Muscle spasms.   A disease of your spine (vertebral disk disease).   A lung infection (pneumonia).   Fluid around your lungs (pulmonary edema).   A kidney infection.   Kidney stones.   A very painful skin rash caused by the chickenpox virus (shingles).  Gallbladder disease.  [place] care will depend on the cause of your pain. In general,  Rest as directed by your caregiver.  Drink enough fluids to keep your urine clear or pale yellow.  Only take over-the-counter or prescription medicines as directed by your caregiver. Some medicines may help relieve the pain.  Tell your caregiver about any changes in your pain.  Follow up with your caregiver as directed. SEEK IMMEDIATE MEDICAL CARE IF:   Your pain is not controlled with medicine.   You  have new or worsening symptoms.  Your pain increases.   You have abdominal pain.   You have shortness of breath.   You have persistent nausea or vomiting.   You have swelling in your abdomen.   You feel faint or pass out.   You have blood in your urine.  You have a fever or persistent symptoms for more than 2-3 days.  You have a fever and your symptoms suddenly get worse. MAKE SURE YOU:   Understand these instructions.  Will watch your condition.  Will get help right away if you are not doing well or get worse. Document Released: 04/25/2005 Document Revised: 11/27/2011 Document Reviewed: 10/17/2011 [facility] Patient Information 2015 [place], [place]. This information is not intended to replace advice given to you by your health care provider. Make sure you discuss any questions you have with your health care provider.

## 2014-01-18 NOTE — ED Provider Notes (Signed)
  Physical Exam  BP 114/49 mmHg  Pulse 73  Temp(Src) 98.8 F (37.1 C) (Oral)  Resp 16  Ht 5\' 1"  (1.549 m)  SpO2 100%  Physical Exam  Genitourinary: Cervix exhibits no motion tenderness, no discharge and no friability. Right adnexum displays no tenderness. Left adnexum displays no tenderness.    ED Course  LUMBAR PUNCTURE Date/Time: 01/19/2014 12:17 AM Performed by: Debby Freiberg Authorized by: Debby Freiberg Consent: Written consent obtained. Indications: evaluation for subarachnoid hemorrhage Anesthesia: local infiltration Local anesthetic: lidocaine 1% with epinephrine Lumbar space: L4-L5 interspace Patient's position: right lateral decubitus Needle gauge: 20 Needle type: spinal needle - Quincke tip Needle length: 3.5 in Number of attempts: 2 Patient tolerance: Patient tolerated the procedure well with no immediate complications Comments: Unsuccessful.      MDM Assumed care of pt from Dr. Dina Rich.  Briefly, pt is a 23 y.o. female with flank pain.  She received migraine cocktail and on assumption of care awaiting reassessment for ha.  On my assessment the pt complains of the worst headache of her life which began over the course of approximately less than 5 minutes 3 days ago. The patient insisted on numerous times that is the worst headache and I had difficulty determining if the headache was sudden in onset of gradual, however the patient did ultimately state that her headache reached maximal intensity within 5 minutes.  Given that this is the case and the patient has had exertional symptoms with worsening with neck movement, as well as a family history of subarachnoid, discussed lumbar puncture. This was attempted as above unsuccessfully. The patient refused further attempts by interventional radiology. I gave her the risks of leaving without lumbar puncture including permanent disability or death and she continued to refuse. I instructed that she could follow-up.  With  regards to abdominal pain she has an unremarkable workup.  I performed a pelvic exam which did not show signs of PID or cervicitis, wet prep unremarkable. She did have a small amount of bleeding. She was discharged home [place] with strict return precautions.        Debby Freiberg, MD 01/19/14 [phone]

## 2014-01-18 NOTE — ED Notes (Signed)
Patient tried to urinate but was unable to go will try again

## 2014-01-18 NOTE — ED Notes (Signed)
Pt now decided not to have IR lumbar puncture, Dr Colin Rhein aware

## 2014-01-19 LAB — GC/CHLAMYDIA PROBE AMP
CT Probe RNA: NEGATIVE
GC Probe RNA: NEGATIVE

## 2014-01-19 NOTE — ED Notes (Signed)
PT LEFT PRIOR TO BEING DISCHARGED,  GOWN FOUND ON BED

## 2014-01-20 ENCOUNTER — Telehealth (HOSPITAL_COMMUNITY): Payer: Self-pay

## 2014-03-03 ENCOUNTER — Inpatient Hospital Stay (HOSPITAL_COMMUNITY)
Admission: AD | Admit: 2014-03-03 | Discharge: 2014-03-03 | Disposition: A | Discharge status: Home / Self Care | Payer: Self-pay | Source: Ambulatory Visit | Attending: Obstetrics and Gynecology | Admitting: Obstetrics and Gynecology

## 2014-03-03 ENCOUNTER — Encounter (HOSPITAL_COMMUNITY): Payer: Self-pay | Admitting: General Practice

## 2014-03-03 DIAGNOSIS — G43009 Migraine without aura, not intractable, without status migrainosus: Secondary | ICD-10-CM | POA: Insufficient documentation

## 2014-03-03 DIAGNOSIS — R112 Nausea with vomiting, unspecified: Secondary | ICD-10-CM | POA: Insufficient documentation

## 2014-03-03 LAB — URINE MICROSCOPIC-ADD ON

## 2014-03-03 LAB — URINALYSIS, ROUTINE W REFLEX MICROSCOPIC
Bilirubin Urine: NEGATIVE
Glucose, UA: NEGATIVE mg/dL
Ketones, ur: NEGATIVE mg/dL
Leukocytes, UA: NEGATIVE
Nitrite: NEGATIVE
Protein, ur: NEGATIVE mg/dL
Specific Gravity, Urine: 1.02 (ref 1.005–1.030)
Urobilinogen, UA: 0.2 mg/dL (ref 0.0–1.0)
pH: 6.5 (ref 5.0–8.0)

## 2014-03-03 LAB — POCT PREGNANCY, URINE: Preg Test, Ur: NEGATIVE

## 2014-03-03 MED ORDER — DIPHENHYDRAMINE HCL 50 MG/ML IJ SOLN
25.0000 mg | Freq: Once | INTRAMUSCULAR | Status: AC
  Administered 2014-03-03: 25 mg via INTRAVENOUS
  Filled 2014-03-03: qty 1

## 2014-03-03 MED ORDER — SODIUM CHLORIDE 0.9 % IV SOLN
INTRAVENOUS | Status: DC
  Administered 2014-03-03: 15:00:00 via INTRAVENOUS

## 2014-03-03 MED ORDER — METOCLOPRAMIDE HCL 5 MG/ML IJ SOLN
10.0000 mg | Freq: Once | INTRAMUSCULAR | Status: AC
  Administered 2014-03-03: 10 mg via INTRAVENOUS
  Filled 2014-03-03: qty 2

## 2014-03-03 MED ORDER — DEXAMETHASONE SODIUM PHOSPHATE 10 MG/ML IJ SOLN
10.0000 mg | Freq: Once | INTRAMUSCULAR | Status: AC
  Administered 2014-03-03: 10 mg via INTRAVENOUS
  Filled 2014-03-03: qty 1

## 2014-03-03 MED ORDER — LACTATED RINGERS IV SOLN: Freq: Once | INTRAVENOUS | Status: DC

## 2014-03-03 NOTE — MAU Provider Note (Signed)
History     CSN: [id number]  Arrival date and time: 03/03/14 1122   First Provider Initiated Contact with Patient 03/03/14 1348      Chief Complaint  Patient presents with  . Generalized Body Aches  . Headache  . Abdominal Pain   HPI Emma Chambers 23 y.o. [id number] nonpregnant female presents to MAU complaining of body aches, headache and abdominal pain.  Her symptoms started 2 days ago and seem to be worsening.  Headache is 10/10, located bilat frontal region with throbbing, increased with movement, associated with photophobia and phonophobia.  She states this is a typical headache for her.  She denies visual symptoms.   Ibuprofen is not helpful.  She has had vomiting 4 times today and many times the 2 days before.  She did eat yesterday but has not had anything today.  She had cranberry juice and water only today.  She had a fever to 102 the first day but none today.  She complains of weakness, denies dizziness, chest pain, shortness of breath.   OB History    Gravida Para Term Preterm AB TAB SAB Ectopic Multiple Living   2 2 1 1  0 0 0 0 0 2      Past Medical History  Diagnosis Date  . UTI (lower urinary tract infection)   . Headache(784.0)   . Gallstones   . Hypertension     GHTN with last pg  . Pregnancy induced hypertension     Past Surgical History  Procedure Laterality Date  . Cholecystectomy N/A 07/27/2012    Procedure: LAPAROSCOPIC CHOLECYSTECTOMY WITH INTRAOPERATIVE CHOLANGIOGRAM;  Surgeon: Madilyn Hook, DO;  Location: [facility];  Service: General;  Laterality: N/A;  . Appendectomy    . Laparoscopic appendectomy N/A 12/09/2012    Procedure: APPENDECTOMY LAPAROSCOPIC;  Surgeon: Gwenyth Ober, MD;  Location: [facility] OR;  Service: General;  Laterality: N/A;    Family History  Problem Relation Age of Onset  . Asthma Mother   . Hypertension Mother   . Parkinsonism Mother   . Diabetes Maternal Grandmother   . Hypertension Maternal Grandmother   . Stroke Maternal  Grandmother   . Anesthesia problems Neg Hx   . Malignant hyperthermia Neg Hx   . Pseudochol deficiency Neg Hx   . Hypotension Neg Hx   . Asthma Brother     History  Substance Use Topics  . Smoking status: Never Smoker   . Smokeless tobacco: Never Used  . Alcohol Use: 0.0 oz/week     Comment: social    Allergies:  Allergies  Allergen Reactions  . Amoxicillin Nausea And Vomiting and Other (See Comments)    Tics & twitches  . Dilaudid [Hydromorphone Hcl] Itching  . Iohexol Hives and Itching    Prescriptions prior to admission  Medication Sig Dispense Refill Last Dose  . acetaminophen (TYLENOL) 500 MG tablet Take 1,000 mg by mouth every 6 (six) hours as needed for headache.   11/17/2013 at Unknown time  . aspirin-acetaminophen-caffeine (EXCEDRIN MIGRAINE) 250-250-65 MG per tablet Take 1 tablet by mouth every 6 (six) hours as needed for headache.   11/17/2013 at Unknown time  . HYDROcodone-acetaminophen (NORCO/VICODIN) 5-325 MG per tablet Take 1-2 tablets by mouth every 6 (six) hours as needed for moderate pain or severe pain. 10 tablet 0   . ibuprofen (ADVIL,MOTRIN) 200 MG tablet Take 200 mg by mouth every 6 (six) hours as needed for mild pain or moderate pain.   01/18/2014 at Unknown time  .  levonorgestrel (MIRENA) 20 MCG/24HR IUD 1 each by Intrauterine route once. Implanted end of May 2014   01/18/2014 at Unknown time  . ondansetron (ZOFRAN ODT) 4 MG disintegrating tablet Take 1 tablet (4 mg total) by mouth every 8 (eight) hours as needed for nausea or vomiting. 20 tablet 0   . ondansetron (ZOFRAN) 4 MG tablet Take 1 tablet (4 mg total) by mouth every 8 (eight) hours as needed for nausea or vomiting. 15 tablet 0   . ranitidine (ZANTAC) 150 MG tablet Take 150 mg by mouth daily as needed for heartburn.   unknown     ROS Pertinent ROS in HPI Physical Exam   Blood pressure 121/79, pulse 90, temperature 98.6 F (37 C), temperature source Oral, resp. rate 18, height 5\' 1"  (1.549 m), weight  231 lb 12.8 oz (105.144 kg), not currently breastfeeding.  Physical Exam  Constitutional: She is oriented to person, place, and time. She appears well-developed and well-nourished. No distress.  HENT:  Head: Normocephalic and atraumatic.  Eyes: EOM are normal.  Neck: Normal range of motion.  Cardiovascular: Normal rate, regular rhythm and normal heart sounds.   Respiratory: Effort normal and breath sounds normal. No respiratory distress.  GI: Soft. Bowel sounds are normal. She exhibits no distension. There is tenderness. There is guarding. There is no rebound.  Tenderness to palpation all across lower abdomen   Musculoskeletal: Normal range of motion.  Neurological: She is alert and oriented to person, place, and time.  Skin: Skin is warm and dry.  Psychiatric: She has a normal mood and affect.    MAU Course  Procedures  MDM IV started with 10mg  Reglan, 25mg  Benadryl and 10mg  Decadron.   No vomiting noted.  Headache pain decreased to 6/10.  Pt indicates her son's daycare has called and needs pt to come and get him because he is sick.  She is ready for discharge.  She is offered rx for nausea medication and she declines.   Assessment and Plan  A:  1. Migraine without aura and without status migrainosus, not intractable   2. Nausea and vomiting, vomiting of unspecified type    P: Discharge to home May use OTC Tylenol for additional headache/body ache relief See PCP if sxs continue Patient may return to MAU as needed or if her condition were to change or worsen   Paticia Stack 03/03/2014, 1:52 PM

## 2014-03-03 NOTE — MAU Note (Signed)
Pt presents to mau with co generalized body aches, headaches, abdominal pain, pain under her right breast, nausea and vomiting. Coughing, runny nose

## 2014-03-03 NOTE — Discharge Instructions (Signed)

## 2014-04-07 ENCOUNTER — Emergency Department (HOSPITAL_COMMUNITY)
Admission: EM | Admit: 2014-04-07 | Discharge: 2014-04-07 | Disposition: A | Discharge status: Home / Self Care | Payer: Self-pay | Attending: Emergency Medicine | Admitting: Emergency Medicine

## 2014-04-07 ENCOUNTER — Emergency Department (HOSPITAL_COMMUNITY): Payer: Self-pay

## 2014-04-07 ENCOUNTER — Encounter (HOSPITAL_COMMUNITY): Payer: Self-pay | Admitting: Emergency Medicine

## 2014-04-07 DIAGNOSIS — Z880 Allergy status to penicillin: Secondary | ICD-10-CM | POA: Insufficient documentation

## 2014-04-07 DIAGNOSIS — I10 Essential (primary) hypertension: Secondary | ICD-10-CM | POA: Insufficient documentation

## 2014-04-07 DIAGNOSIS — R112 Nausea with vomiting, unspecified: Secondary | ICD-10-CM | POA: Insufficient documentation

## 2014-04-07 DIAGNOSIS — Z3202 Encounter for pregnancy test, result negative: Secondary | ICD-10-CM | POA: Insufficient documentation

## 2014-04-07 DIAGNOSIS — Z87440 Personal history of urinary (tract) infections: Secondary | ICD-10-CM | POA: Insufficient documentation

## 2014-04-07 DIAGNOSIS — Z8719 Personal history of other diseases of the digestive system: Secondary | ICD-10-CM | POA: Insufficient documentation

## 2014-04-07 LAB — URINE MICROSCOPIC-ADD ON

## 2014-04-07 LAB — COMPREHENSIVE METABOLIC PANEL
ALT: 15 U/L (ref 0–35)
AST: 20 U/L (ref 0–37)
Albumin: 4.1 g/dL (ref 3.5–5.2)
Alkaline Phosphatase: 80 U/L (ref 39–117)
Anion gap: 11 (ref 5–15)
BUN: 8 mg/dL (ref 6–23)
CO2: 22 mmol/L (ref 19–32)
Calcium: 8.8 mg/dL (ref 8.4–10.5)
Chloride: 102 mEq/L (ref 96–112)
Creatinine, Ser: 0.63 mg/dL (ref 0.50–1.10)
GFR calc Af Amer: >90 mL/min (ref >90)
GFR calc non Af Amer: >90 mL/min (ref >90)
Glucose, Bld: 96 mg/dL (ref 70–99)
Potassium: 3.6 mmol/L (ref 3.5–5.1)
Sodium: 135 mmol/L (ref 135–145)
Total Bilirubin: 0.3 mg/dL (ref 0.3–1.2)
Total Protein: 7.5 g/dL (ref 6.0–8.3)

## 2014-04-07 LAB — CBC WITH DIFFERENTIAL/PLATELET
Basophils Absolute: 0 10*3/uL (ref 0.0–0.1)
Basophils Relative: 0 % (ref 0–1)
Eosinophils Absolute: 0 10*3/uL (ref 0.0–0.7)
Eosinophils Relative: 0 % (ref 0–5)
HCT: 37.5 % (ref 36.0–46.0)
Hemoglobin: 12.5 g/dL (ref 12.0–15.0)
Lymphocytes Relative: 8 % — ABNORMAL LOW (ref 12–46)
Lymphs Abs: 0.9 10*3/uL (ref 0.7–4.0)
MCH: 27.4 pg (ref 26.0–34.0)
MCHC: 33.3 g/dL (ref 30.0–36.0)
MCV: 82.1 fL (ref 78.0–100.0)
Monocytes Absolute: 0.4 10*3/uL (ref 0.1–1.0)
Monocytes Relative: 4 % (ref 3–12)
Neutro Abs: 9 10*3/uL — ABNORMAL HIGH (ref 1.7–7.7)
Neutrophils Relative %: 88 % — ABNORMAL HIGH (ref 43–77)
Platelets: 311 10*3/uL (ref 150–400)
RBC: 4.57 MIL/uL (ref 3.87–5.11)
RDW: 14.4 % (ref 11.5–15.5)
WBC: 10.3 10*3/uL (ref 4.0–10.5)

## 2014-04-07 LAB — PREGNANCY, URINE: Preg Test, Ur: NEGATIVE

## 2014-04-07 LAB — URINALYSIS, ROUTINE W REFLEX MICROSCOPIC
Bilirubin Urine: NEGATIVE
Glucose, UA: NEGATIVE mg/dL
Ketones, ur: NEGATIVE mg/dL
Leukocytes, UA: NEGATIVE
Nitrite: NEGATIVE
Protein, ur: NEGATIVE mg/dL
Specific Gravity, Urine: >1.03 — ABNORMAL HIGH (ref 1.005–1.030)
Urobilinogen, UA: 0.2 mg/dL (ref 0.0–1.0)
pH: 6 (ref 5.0–8.0)

## 2014-04-07 LAB — LIPASE, BLOOD: Lipase: 24 U/L (ref 11–59)

## 2014-04-07 MED ORDER — DICYCLOMINE HCL 10 MG/ML IM SOLN
20.0000 mg | Freq: Once | INTRAMUSCULAR | Status: AC
  Administered 2014-04-07: 20 mg via INTRAMUSCULAR
  Filled 2014-04-07: qty 2

## 2014-04-07 MED ORDER — PROMETHAZINE HCL 25 MG RE SUPP: 25.0000 mg | Freq: Four times a day (QID) | RECTAL | Status: DC | PRN

## 2014-04-07 MED ORDER — ONDANSETRON 4 MG PO TBDP
8.0000 mg | ORAL_TABLET | Freq: Once | ORAL | Status: AC
  Administered 2014-04-07: 8 mg via ORAL
  Filled 2014-04-07: qty 2

## 2014-04-07 MED ORDER — PROMETHAZINE HCL 25 MG/ML IJ SOLN
25.0000 mg | Freq: Once | INTRAMUSCULAR | Status: AC
  Administered 2014-04-07: 25 mg via INTRAMUSCULAR
  Filled 2014-04-07: qty 1

## 2014-04-07 MED ORDER — PROMETHAZINE HCL 25 MG PO TABS: 25.0000 mg | ORAL_TABLET | Freq: Four times a day (QID) | ORAL | Status: DC | PRN

## 2014-04-07 NOTE — Discharge Instructions (Signed)
°Emergency Department Resource Guide °1) Find a Doctor and Pay Out of Pocket °Although you won't have to find out who is covered by your insurance plan, it is a good idea to ask around and get recommendations. You will then need to call the office and see if the doctor you have chosen will accept you as a new patient and what types of options they offer for patients who are self-pay. Some doctors offer discounts or will set up payment plans for their patients who do not have insurance, but you will need to ask so you aren't surprised when you get to your appointment. ° °2) Contact Your Local Health Department °Not all health departments have doctors that can see patients for sick visits, but many do, so it is worth a call to see if yours does. If you don't know where your local health department is, you can check in your phone book. The CDC also has a tool to help you locate your state's health department, and many state websites also have listings of all of their local health departments. ° °3) Find a Walk-in Clinic °If your illness is not likely to be very severe or complicated, you may want to try a walk in clinic. These are popping up all over the country in pharmacies, drugstores, and shopping centers. They're usually staffed by nurse practitioners or physician assistants that have been trained to treat common illnesses and complaints. They're usually fairly quick and inexpensive. However, if you have serious medical issues or chronic medical problems, these are probably not your best option. ° °No Primary Care Doctor: °- Call [facility] at  832-8000 - they can help you locate a primary care doctor that  accepts your insurance, provides certain services, etc. °- Physician Referral Service- 1-800-533-3463 ° °Chronic Pain Problems: °Organization         Address  Phone   Notes  °[facility]  (336) [phone] Patients need to be referred by their primary care doctor.  ° °Medication  Assistance: °Organization         Address  Phone   Notes  °[place] [facility] [address] °[place], [place] [address] (336) 641-8030 --Must be a resident of [place] °-- Must have NO insurance coverage whatsoever (no Medicaid/ Medicare, etc.) °-- The pt. MUST have a primary care doctor that directs their care regularly and follows them in the community °  °[facility]  (866) 331-1348   °[employer]  (888) 892-1162   ° °Agencies that provide inexpensive medical care: °Organization         Address  Phone   Notes  °[facility]  (336) 832-8035   °[facility]    (336) 832-7272   °[facility] [address] °[place], [place] [address] (336) 832-4777   °[facility] [address], °[place] (336) 271-4999   °[facility]    (336) 373-0678   °[facility]    (336) 272-1050   °[facility] ° [address], [place] Phone:  (336) 832-4444, Fax:  (336) 832-4440 Hours of Operation:  9 am - 6 pm, M-F.  Also accepts Medicaid/Medicare and self-pay.  °[facility] ° [address],  Phone: (336) 832-3150, Fax: (336) [phone]. Hours of Operation:  8:30 am - 5:30 pm, M-F.  Also accepts Medicaid and self-pay.  °[facility] [place] [address], [place] Phone: (336) 878-6027   °[facility] [address], [place], [place] (336)723-1848, Ext. 123 Mondays & Thursdays: 7-9 AM.  First 15 patients are seen on a first come, first serve basis. °  ° °Medicaid-accepting [place] Providers: ° °Organization         Address  Phone   Notes  °[facility] [address], [place] (336) 641-2100 Also accepts self-pay patients.  °[facility] [address], [place] ° (336) 856-9996   °[facility] [address], [place]  (336) 288-8857   °[facility] [address] [place] [address], [place] (336) 299-7000   °Veita Bland [address], [place]  ° (336) 373-1557 Only accepts [place] Access Medicaid patients after they have their name applied to their card.  ° °Self-Pay (no insurance) in [place]: ° °Organization         Address  Phone   Notes  °Sickle Cell Patients, [facility] [address], [place] (336) 832-1970   °[facility] [address], [place] (336) 832-4400   °[facility] ° [address], [place] (336) 992-4800   °[facility]/Dr. Osei-Bonsu ° [address] [place] [address], [place] or [address], [place] (336) [phone] Phone number for both [place] and [place] locations is the same.  °[facility] [address], [place] (336) [phone]   °[facility] [address] [place] [address], [place] or [address] (336) 852-7530 °(336) 878-2260   °[facility] [address], [place] (336) 350-1642, phone; (336) 294-5005, fax Sees patients 1st and 3rd Saturday of every month.  Must not qualify for public or private insurance (i.e. Medicaid, Medicare, [place] Health Choice, Veterans' Benefits) • Household income should be no more than 200% of the poverty level •The clinic cannot treat you if you are pregnant or think you are pregnant • Sexually transmitted diseases are not treated at the clinic.  ° ° °Dental Care: °Organization         Address  Phone  Notes  °[place] [facility] [address],  (336) 641-6152 Accepts children up to age 21 who are enrolled in Medicaid or [place] Health Choice; pregnant women with a Medicaid card; and children who have applied for Medicaid or [place] Health Choice, but were declined, whose parents can pay a reduced fee at time of service.  °[place]  [facility] [place]  [address], [place] (336) 641-7733 Accepts children up to age 21 who are enrolled in Medicaid or [place] Health Choice; pregnant women with a Medicaid card; and children who have applied for Medicaid or [place] Health Choice, but were declined, whose parents can pay a reduced fee at time of service.  °[facility] ° [address],  (336) 641-4533 Patients are seen by appointment only. Walk-ins are not accepted. [facility] will see patients 18 years of age and older. °Monday - Tuesday (8am-5pm) °Most Wednesdays (8:30-5pm) °$30 per visit, cash only  °[facility] ° [address], [place] (336) 641-4533 Patients are seen by appointment only. Walk-ins are not accepted. [facility] will see patients 18 years of age and older. °One   Wednesday Evening (Monthly: Volunteer Based).  $30 per visit, cash only  °[facility]  (919) [phone] for adults; Children under age 4, call Graduate Pediatric Dentistry at ([phone]) [phone]. Children aged 4-14, please call ([phone]) [phone] to request a pediatric application. ° Dental services are provided in all areas of dental care including fillings, crowns and bridges, complete and partial dentures, implants, gum treatment, root canals, and extractions. Preventive care is also provided. Treatment is provided to both adults and children. °Patients are selected via a lottery and there is often a waiting list. °  °[facility] [address], °[place] ° (336) 763-8833 [email or web address] °  °[facility] [address], [place], [place] (336)723-1848, Ext. 123 Second and Fourth Thursday of each month, opens at 6:30 AM; Clinic ends at 9 AM.  Patients are seen on a first-come first-served basis, and a limited number are seen during each clinic.  ° °[facility] ° [address], [place], [place] (336) [phone]    Eligibility Requirements °You must have lived in [place], [place], or [place] counties for at least the last three months. °  You cannot be eligible for state or federal sponsored healthcare insurance, including Veterans Administration, Medicaid, or Medicare. °  You generally cannot be eligible for healthcare insurance through your employer.  °  How to apply: °Eligibility screenings are held every Tuesday and Wednesday afternoon from 1:00 pm until 4:00 pm. You do not need an appointment for the interview!  °[facility] [address], [place], [place] 336-631-2330   °[facility]  336-342-8273   °[facility]  336-703-3100   °[facility]  336-570-6415   ° °Behavioral Health Resources in the Community: °Intensive Outpatient Programs °Organization         Address  Phone  Notes  °[facility] [address], [place], [place] 336-878-6098   °[facility] [address], [place], [place] 336-832-9800   °[facility] [address], [place], [place] ° 336-882-2125   °[facility] [address],  °[place], [place] 1-800-853-5163 or 336-641-4981   °Substance Abuse Resources °Organization         Address  Phone  Notes  °[facility]  336-882-2125   °[facility]  336-784-9470   °[facility]  336-285-9073   °[facility]  336-845-3988   °Residential & Outpatient Substance Abuse Program  1-800-659-3381   °Psychological Services °Organization         Address  Phone  Notes  °[facility]  336- 832-9600   °[facility]  336- 378-7881   °[facility] [address], [place] 1-800-853-5163 or 336-641-4981   ° °Mobile Crisis Teams °Organization         Address  Phone  Notes  °[facility], Mobile Crisis Care Unit  1-877-626-1772   °Assertive °[facility] ° [address]  [place], [place] 336-834-9664   °Sharon DeEsch [address] °[place] 336-554-5454   ° °Self-Help/Support Groups °Organization         Address  Phone             Notes  °[facility] - variety of support groups  336- 373-1402 Call for more information  °Narcotics Anonymous (NA), [facility] [address], °[place]  2 meetings at this location  ° °  Residential Treatment Programs Organization         Address  Phone  Notes  [facility] [address]    [place]  1-5200737134   [facility]  [address] [place] [address], [place], [place]   [place], [place] [phone] Admissions: 8am-3pm M-F  [facility] [place] [address]    [place], [place] [phone]   [facility] [address] [place], [place], [place]   [facility] [address],  [place], [place]   [facility] Intensive Outpatient [address], [place], [place]   [facility] [address] [place]  [place], [place] 1-860 497 8654 or (985)190-7577   [facility] [address] [place], [place] Accepts Medicaid  [facility] [address]  [place] 1-319-575-0524 Substance Abuse/Addiction Treatment   [facility]         Address  Phone  Notes  [facility]  309-176-4050   Domenic Schwab, PhD [address] [place], [place]   858-833-9791 or 3078615433   [facility] [place], [place] [phone]   [facility] [address] [place], [place] (346)[phone] Insurance/Medicaid/sponsorship through [facility] [address] [place]                                    [place], [place] [phone] [place] [address], [place] ([phone]    Dr. Adele Schilder  [phone]   [facility] 1) [address] [place] 2) [place] 3)  [address], [place] ([phone] ([phone]  [phone]   [place] [phone] or [phone] (After Hours)      Take the prescriptions as directed.  Increase your fluid intake (ie:  Gatoraide) for the next few days.  Eat a bland diet and advance to your regular diet slowly as you can tolerate it.  Call your regular medical doctor today to schedule a follow up appointment in the next 2 days.  Return to the Emergency Department immediately if not improving (or even worsening) despite taking the medicines as prescribed, any black or bloody stool or vomit, if you develop a fever over "101," or for any other concerns.

## 2014-04-07 NOTE — ED Notes (Signed)
Pt vomited x1 s/p PO challenge.

## 2014-04-07 NOTE — ED Notes (Signed)
Pt vomited s/p PO challenge with water. Dr. Thurnell Garbe aware.

## 2014-04-07 NOTE — ED Provider Notes (Signed)
CSN: [id number]     Arrival date & time 04/07/14  1124 History   First MD Initiated Contact with Patient 04/07/14 1151     Chief Complaint  Patient presents with  . Emesis  . Abdominal Pain  . Breast Pain     HPI Pt was seen at 1200. Per pt, c/o gradual onset and persistence of multiple intermittent episodes of N/V that began last night at 2100. Has been associated with generalized "aching" abd pain. Denies diarrhea, no CP/SOB, no back pain, no fevers, no black or blood in stools or emesis. Pt also c/o "my breasts hurt for a really long time now." Pt states she "figured I was here so I'd mention it." Denies nipple discharge, no rash, no injury.     Past Medical History  Diagnosis Date  . UTI (lower urinary tract infection)   . Headache(784.0)     chronic  . Gallstones   . Hypertension     GHTN with last pg  . Pregnancy induced hypertension    Past Surgical History  Procedure Laterality Date  . Cholecystectomy N/A 07/27/2012    Procedure: LAPAROSCOPIC CHOLECYSTECTOMY WITH INTRAOPERATIVE CHOLANGIOGRAM;  Surgeon: Madilyn Hook, DO;  Location: [facility];  Service: General;  Laterality: N/A;  . Appendectomy    . Laparoscopic appendectomy N/A 12/09/2012    Procedure: APPENDECTOMY LAPAROSCOPIC;  Surgeon: Gwenyth Ober, MD;  Location: [facility];  Service: General;  Laterality: N/A;   Family History  Problem Relation Age of Onset  . Asthma Mother   . Hypertension Mother   . Parkinsonism Mother   . Diabetes Maternal Grandmother   . Hypertension Maternal Grandmother   . Stroke Maternal Grandmother   . Anesthesia problems Neg Hx   . Malignant hyperthermia Neg Hx   . Pseudochol deficiency Neg Hx   . Hypotension Neg Hx   . Asthma Brother    History  Substance Use Topics  . Smoking status: Never Smoker   . Smokeless tobacco: Never Used  . Alcohol Use: 0.0 oz/week     Comment: social   OB History    Gravida Para Term Preterm AB TAB SAB Ectopic Multiple Living   2 2 1 1  0 0 0 0 0 2      Review of Systems ROS: Statement: All systems negative except as marked or noted in the HPI; Constitutional: Negative for fever and chills. ; ; Eyes: Negative for eye pain, redness and discharge. ; ; ENMT: Negative for ear pain, hoarseness, nasal congestion, sinus pressure and sore throat. ; ; Cardiovascular: Negative for chest pain, palpitations, diaphoresis, dyspnea and peripheral edema. ; ; Respiratory: Negative for cough, wheezing and stridor. ; ; Gastrointestinal: +N/V, abd pain. Negative for diarrhea, blood in stool, hematemesis, jaundice and rectal bleeding. . ; ; Genitourinary: Negative for dysuria, flank pain and hematuria. ; ; GYN:  +breast pain. No vaginal bleeding, no vaginal discharge, no vulvar pain.;;  Musculoskeletal: Negative for back pain and neck pain. Negative for swelling and trauma.; ; Skin: Negative for pruritus, rash, abrasions, blisters, bruising and skin lesion.; ; Neuro: Negative for headache, lightheadedness and neck stiffness. Negative for weakness, altered level of consciousness , altered mental status, extremity weakness, paresthesias, involuntary movement, seizure and syncope.      Allergies  Amoxicillin; Dilaudid; and Iohexol  Home Medications   Prior to Admission medications   Medication Sig Start Date End Date Taking? Authorizing Provider  levonorgestrel (MIRENA) 20 MCG/24HR IUD 1 each by Intrauterine route once. Implanted end of  May 2014   Yes Historical Provider, MD  ondansetron (ZOFRAN ODT) 4 MG disintegrating tablet Take 1 tablet (4 mg total) by mouth every 8 (eight) hours as needed for nausea or vomiting. Patient not taking: Reported on 03/03/2014 11/17/13   Garald Balding, NP   BP 100/61 mmHg  Pulse 107  Temp(Src) 98 F (36.7 C) (Oral)  Resp 17  Ht 5\' 1"  (1.549 m)  Wt 200 lb (90.719 kg)  BMI 37.81 kg/m2  SpO2 100%  Breastfeeding? No Physical Exam  1205: Physical examination:  Nursing notes reviewed; Vital signs and O2 SAT reviewed;   Constitutional: Well developed, Well nourished, Well hydrated, In no acute distress; Head:  Normocephalic, atraumatic; Eyes: EOMI, PERRL, No scleral icterus; ENMT: Mouth and pharynx normal, Mucous membranes moist; Neck: Supple, Full range of motion, No lymphadenopathy; Cardiovascular: Regular rate and rhythm, No murmur, rub, or gallop; Respiratory: Breath sounds clear & equal bilaterally, No rales, rhonchi, wheezes.  Speaking full sentences with ease, Normal respiratory effort/excursion; Chest: Nontender, Movement normal; Abdomen: Soft, +mild diffuse tenderness to palp. No rebound or guarding. Nondistended, Normal bowel sounds; Genitourinary: No CVA tenderness; Extremities: Pulses normal, No tenderness, No edema, No calf edema or asymmetry.; Neuro: AA&Ox3, Major CN grossly intact.  Speech clear. No gross focal motor or sensory deficits in extremities.; Skin: Color normal, Warm, Dry.   ED Course  Procedures     EKG Interpretation None      MDM  MDM Reviewed: previous chart, nursing note and vitals Reviewed previous: labs Interpretation: labs and x-ray      Results for orders placed or performed during the hospital encounter of 04/07/14  Pregnancy, urine  Result Value Ref Range   Preg Test, Ur NEGATIVE NEGATIVE  Urinalysis, Routine w reflex microscopic  Result Value Ref Range   Color, Urine YELLOW YELLOW   APPearance HAZY (A) CLEAR   Specific Gravity, Urine >1.030 (H) 1.005 - 1.030   pH 6.0 5.0 - 8.0   Glucose, UA NEGATIVE NEGATIVE mg/dL   Hgb urine dipstick MODERATE (A) NEGATIVE   Bilirubin Urine NEGATIVE NEGATIVE   Ketones, ur NEGATIVE NEGATIVE mg/dL   Protein, ur NEGATIVE NEGATIVE mg/dL   Urobilinogen, UA 0.2 0.0 - 1.0 mg/dL   Nitrite NEGATIVE NEGATIVE   Leukocytes, UA NEGATIVE NEGATIVE  Comprehensive metabolic panel  Result Value Ref Range   Sodium 135 135 - 145 mmol/L   Potassium 3.6 3.5 - 5.1 mmol/L   Chloride 102 96 - 112 mEq/L   CO2 22 19 - 32 mmol/L   Glucose, Bld  96 70 - 99 mg/dL   BUN 8 6 - 23 mg/dL   Creatinine, Ser 0.63 0.50 - 1.10 mg/dL   Calcium 8.8 8.4 - 10.5 mg/dL   Total Protein 7.5 6.0 - 8.3 g/dL   Albumin 4.1 3.5 - 5.2 g/dL   AST 20 0 - 37 U/L   ALT 15 0 - 35 U/L   Alkaline Phosphatase 80 39 - 117 U/L   Total Bilirubin 0.3 0.3 - 1.2 mg/dL   GFR calc non Af Amer >90 >90 mL/min   GFR calc Af Amer >90 >90 mL/min   Anion gap 11 5 - 15  Lipase, blood  Result Value Ref Range   Lipase 24 11 - 59 U/L  CBC with Differential  Result Value Ref Range   WBC 10.3 4.0 - 10.5 K/uL   RBC 4.57 3.87 - 5.11 MIL/uL   Hemoglobin 12.5 12.0 - 15.0 g/dL   HCT 37.5 36.0 -  46.0 %   MCV 82.1 78.0 - 100.0 fL   MCH 27.4 26.0 - 34.0 pg   MCHC 33.3 30.0 - 36.0 g/dL   RDW 14.4 11.5 - 15.5 %   Platelets 311 150 - 400 K/uL   Neutrophils Relative % 88 (H) 43 - 77 %   Neutro Abs 9.0 (H) 1.7 - 7.7 K/uL   Lymphocytes Relative 8 (L) 12 - 46 %   Lymphs Abs 0.9 0.7 - 4.0 K/uL   Monocytes Relative 4 3 - 12 %   Monocytes Absolute 0.4 0.1 - 1.0 K/uL   Eosinophils Relative 0 0 - 5 %   Eosinophils Absolute 0.0 0.0 - 0.7 K/uL   Basophils Relative 0 0 - 1 %   Basophils Absolute 0.0 0.0 - 0.1 K/uL  Urine microscopic-add on  Result Value Ref Range   Squamous Epithelial / LPF MANY (A) RARE   WBC, UA 0-2 <3 WBC/hpf   RBC / HPF 3-6 <3 RBC/hpf   Bacteria, UA MANY (A) RARE   Urine-Other MUCOUS PRESENT    Dg Abd Acute W/chest 04/07/2014   CLINICAL DATA:  Hervey Ard lower abdominal pain and 30 episodes of vomiting since last night with breast pain for a few weeks, history hypertension  EXAM: ACUTE ABDOMEN SERIES (ABDOMEN 2 VIEW & CHEST 1 VIEW)  COMPARISON:  Chest radiograph 05/05/2013, abdominal radiographs 03/05/2010  FINDINGS: Normal heart size, mediastinal contours and pulmonary vascularity.  Lungs clear.  No pleural effusion or pneumothorax.  Surgical clips RIGHT upper quadrant likely prior cholecystectomy.  Normal bowel gas pattern.  No bowel dilatation, bowel wall thickening  or free intraperitoneal air.  IUD projects over pelvis.  No urinary tract calcification or acute osseous findings.  IMPRESSION: No acute abnormalities.   Electronically Signed   By: Lavonia Dana M.D.   On: 04/07/2014 13:17    1615:  Pt has tol PO well while in the ED without N/V.  No stooling while in the ED.  Abd benign, VSS. Feels better and wants to go home now. Dx and testing d/w pt and family.  Questions answered.  Verb understanding, agreeable to d/c home with outpt f/u.    Francine Graven, DO 04/10/14 1453

## 2014-04-07 NOTE — ED Notes (Signed)
Pt tolerating saltine crackers without issue.

## 2014-04-07 NOTE — ED Notes (Signed)
Pt presents from home via POV with c/o sharp lower abdominal pain and emesis since last night and breast pain for a few weeks.  Pt reports approx 30 episodes of emesis since last night, pt is A&Ox4 and in NAD.

## 2014-04-15 ENCOUNTER — Emergency Department (HOSPITAL_COMMUNITY)
Admission: EM | Admit: 2014-04-15 | Discharge: 2014-04-15 | Disposition: A | Discharge status: Home / Self Care | Payer: Self-pay | Attending: Emergency Medicine | Admitting: Emergency Medicine

## 2014-04-15 ENCOUNTER — Encounter (HOSPITAL_COMMUNITY): Payer: Self-pay | Admitting: Physical Medicine and Rehabilitation

## 2014-04-15 ENCOUNTER — Emergency Department (HOSPITAL_COMMUNITY): Payer: Self-pay

## 2014-04-15 DIAGNOSIS — K2970 Gastritis, unspecified, without bleeding: Secondary | ICD-10-CM | POA: Insufficient documentation

## 2014-04-15 DIAGNOSIS — Z87440 Personal history of urinary (tract) infections: Secondary | ICD-10-CM | POA: Insufficient documentation

## 2014-04-15 DIAGNOSIS — R519 Headache, unspecified: Secondary | ICD-10-CM

## 2014-04-15 DIAGNOSIS — R51 Headache: Secondary | ICD-10-CM | POA: Insufficient documentation

## 2014-04-15 DIAGNOSIS — I10 Essential (primary) hypertension: Secondary | ICD-10-CM | POA: Insufficient documentation

## 2014-04-15 DIAGNOSIS — R079 Chest pain, unspecified: Secondary | ICD-10-CM | POA: Insufficient documentation

## 2014-04-15 DIAGNOSIS — Z880 Allergy status to penicillin: Secondary | ICD-10-CM | POA: Insufficient documentation

## 2014-04-15 LAB — CBC WITH DIFFERENTIAL/PLATELET
Basophils Absolute: 0 10*3/uL (ref 0.0–0.1)
Basophils Relative: 0 % (ref 0–1)
Eosinophils Absolute: 0.5 10*3/uL (ref 0.0–0.7)
Eosinophils Relative: 5 % (ref 0–5)
HCT: 36.2 % (ref 36.0–46.0)
Hemoglobin: 11.8 g/dL — ABNORMAL LOW (ref 12.0–15.0)
Lymphocytes Relative: 30 % (ref 12–46)
Lymphs Abs: 3.1 10*3/uL (ref 0.7–4.0)
MCH: 26.6 pg (ref 26.0–34.0)
MCHC: 32.6 g/dL (ref 30.0–36.0)
MCV: 81.7 fL (ref 78.0–100.0)
Monocytes Absolute: 0.5 10*3/uL (ref 0.1–1.0)
Monocytes Relative: 5 % (ref 3–12)
Neutro Abs: 6.2 10*3/uL (ref 1.7–7.7)
Neutrophils Relative %: 60 % (ref 43–77)
Platelets: 380 10*3/uL (ref 150–400)
RBC: 4.43 MIL/uL (ref 3.87–5.11)
RDW: 14.2 % (ref 11.5–15.5)
WBC: 10.4 10*3/uL (ref 4.0–10.5)

## 2014-04-15 LAB — COMPREHENSIVE METABOLIC PANEL
ALT: 14 U/L (ref 0–35)
AST: 18 U/L (ref 0–37)
Albumin: 4 g/dL (ref 3.5–5.2)
Alkaline Phosphatase: 76 U/L (ref 39–117)
Anion gap: 5 (ref 5–15)
BUN: 10 mg/dL (ref 6–23)
CO2: 28 mmol/L (ref 19–32)
Calcium: 9.1 mg/dL (ref 8.4–10.5)
Chloride: 105 mmol/L (ref 96–112)
Creatinine, Ser: 0.77 mg/dL (ref 0.50–1.10)
GFR calc Af Amer: >90 mL/min (ref >90)
GFR calc non Af Amer: >90 mL/min (ref >90)
Glucose, Bld: 97 mg/dL (ref 70–99)
Potassium: 3.5 mmol/L (ref 3.5–5.1)
Sodium: 138 mmol/L (ref 135–145)
Total Bilirubin: 0.4 mg/dL (ref 0.3–1.2)
Total Protein: 7.3 g/dL (ref 6.0–8.3)

## 2014-04-15 LAB — I-STAT TROPONIN, ED: Troponin i, poc: 0 ng/mL (ref 0.00–0.08)

## 2014-04-15 MED ORDER — GI COCKTAIL ~~LOC~~
30.0000 mL | Freq: Once | ORAL | Status: AC
  Administered 2014-04-15: 30 mL via ORAL
  Filled 2014-04-15: qty 30

## 2014-04-15 MED ORDER — METOCLOPRAMIDE HCL 5 MG/ML IJ SOLN
10.0000 mg | Freq: Once | INTRAMUSCULAR | Status: AC
  Administered 2014-04-15: 10 mg via INTRAMUSCULAR
  Filled 2014-04-15: qty 2

## 2014-04-15 MED ORDER — DEXAMETHASONE SODIUM PHOSPHATE 10 MG/ML IJ SOLN
10.0000 mg | Freq: Once | INTRAMUSCULAR | Status: AC
  Administered 2014-04-15: 10 mg via INTRAMUSCULAR
  Filled 2014-04-15: qty 1

## 2014-04-15 NOTE — ED Notes (Signed)
Pt not in room and belongings not at bedside. Dr. Mariam Dollar informed and he stated he was about to discharge patient anyway. Pt left without getting discharge paperwork.

## 2014-04-15 NOTE — Discharge Instructions (Signed)

## 2014-04-15 NOTE — ED Notes (Signed)
Pt presents to department for evaluation of nausea/vomiting and misternal chest pain. Also states she has been very stressed out due to death in family. 5/10 chest pain at the time. Pt is alert and oriented x4. Respirations unlabored. NAD.

## 2014-04-15 NOTE — ED Notes (Signed)
Dr. Mariam Dollar at bedside.

## 2014-04-16 NOTE — ED Provider Notes (Signed)
CSN: [id number]     Arrival date & time 04/15/14  1825 History   First MD Initiated Contact with Patient 04/15/14 2124     Chief Complaint  Patient presents with  . Chest Pain  . Emesis     (Consider location/radiation/quality/duration/timing/severity/associated sxs/prior Treatment) Patient is a 24 y.o. female presenting with abdominal pain. The history is provided by the patient. No language interpreter was used.  Abdominal Pain Pain location:  Epigastric Pain quality: burning   Pain radiates to:  Chest Pain severity:  Moderate Onset quality:  Gradual Duration:  5 days Timing:  Constant Progression:  Worsening Chronicity:  New Context: not alcohol use, not awakening from sleep, not eating, not medication withdrawal, not recent illness and not recent sexual activity   Relieved by:  Nothing Exacerbated by: execedrin migraine. Ineffective treatments:  None tried Associated symptoms: nausea and vomiting   Associated symptoms: no anorexia, no chills, no cough, no diarrhea, no dysuria and no fever   Vomiting:    Quality:  Stomach contents   Severity:  Moderate   Timing:  Constant   Progression:  Unchanged Risk factors: aspirin and obesity   Risk factors: no alcohol abuse and has not had multiple surgeries     Past Medical History  Diagnosis Date  . UTI (lower urinary tract infection)   . Headache(784.0)     chronic  . Gallstones   . Hypertension     GHTN with last pg  . Pregnancy induced hypertension    Past Surgical History  Procedure Laterality Date  . Cholecystectomy N/A 07/27/2012    Procedure: LAPAROSCOPIC CHOLECYSTECTOMY WITH INTRAOPERATIVE CHOLANGIOGRAM;  Surgeon: Madilyn Hook, DO;  Location: [facility];  Service: General;  Laterality: N/A;  . Appendectomy    . Laparoscopic appendectomy N/A 12/09/2012    Procedure: APPENDECTOMY LAPAROSCOPIC;  Surgeon: Gwenyth Ober, MD;  Location: [facility] OR;  Service: General;  Laterality: N/A;   Family History  Problem Relation Age of  Onset  . Asthma Mother   . Hypertension Mother   . Parkinsonism Mother   . Diabetes Maternal Grandmother   . Hypertension Maternal Grandmother   . Stroke Maternal Grandmother   . Anesthesia problems Neg Hx   . Malignant hyperthermia Neg Hx   . Pseudochol deficiency Neg Hx   . Hypotension Neg Hx   . Asthma Brother    History  Substance Use Topics  . Smoking status: Never Smoker   . Smokeless tobacco: Never Used  . Alcohol Use: 0.0 oz/week     Comment: social   OB History    Gravida Para Term Preterm AB TAB SAB Ectopic Multiple Living   2 2 1 1  0 0 0 0 0 2     Review of Systems  Constitutional: Negative for fever and chills.  Respiratory: Negative for cough.   Gastrointestinal: Positive for nausea, vomiting and abdominal pain. Negative for diarrhea and anorexia.  Genitourinary: Negative for dysuria.  Musculoskeletal: Negative for back pain and neck pain.  All other systems reviewed and are negative.     Allergies  Amoxicillin; Dilaudid; and Iohexol  Home Medications   Prior to Admission medications   Medication Sig Start Date End Date Taking? Authorizing Provider  aspirin-acetaminophen-caffeine (EXCEDRIN MIGRAINE) [phone] MG per tablet Take 1 tablet by mouth every 6 (six) hours as needed for headache.   Yes Historical Provider, MD  levonorgestrel (MIRENA) 20 MCG/24HR IUD 1 each by Intrauterine route once. Implanted end of May 2014   Yes Historical Provider,  MD  promethazine (PHENERGAN) 25 MG tablet Take 1 tablet (25 mg total) by mouth every 6 (six) hours as needed for nausea or vomiting. 04/07/14  Yes Francine Graven, DO  ondansetron (ZOFRAN ODT) 4 MG disintegrating tablet Take 1 tablet (4 mg total) by mouth every 8 (eight) hours as needed for nausea or vomiting. Patient not taking: Reported on 03/03/2014 11/17/13   Garald Balding, NP  promethazine (PHENERGAN) 25 MG suppository Place 1 suppository (25 mg total) rectally every 6 (six) hours as needed for nausea or  vomiting. Patient not taking: Reported on 04/15/2014 04/07/14   Francine Graven, DO   BP 120/67 mmHg  Pulse 91  Temp(Src) 98.6 F (37 C) (Oral)  Resp 20  SpO2 99% Physical Exam  Constitutional: She is oriented to person, place, and time. She appears well-developed and well-nourished. No distress.  HENT:  Head: Normocephalic and atraumatic.  Eyes: Pupils are equal, round, and reactive to light.  Neck: Normal range of motion.  Cardiovascular: Normal rate, regular rhythm, normal heart sounds and intact distal pulses.   Pulmonary/Chest: Effort normal. No respiratory distress. She has no wheezes. She exhibits no tenderness.  Abdominal: Soft. Bowel sounds are normal. She exhibits no distension. There is tenderness in the epigastric area. There is no rigidity, no rebound, no guarding, no tenderness at McBurney's point and negative Murphy's sign.  Neurological: She is alert and oriented to person, place, and time. She has normal strength. No cranial nerve deficit or sensory deficit. She exhibits normal muscle tone. Coordination and gait normal.  Strength 5/5 bilateral upper and lower extremities.  Sensation intact x4 extremities.  CN II-XII intact.   Skin: Skin is warm and dry.  Nursing note and vitals reviewed.   ED Course  Procedures (including critical care time) Labs Review Labs Reviewed  CBC WITH DIFFERENTIAL/PLATELET - Abnormal; Notable for the following:    Hemoglobin 11.8 (*)    All other components within normal limits  COMPREHENSIVE METABOLIC PANEL  Randolm Idol, ED    Imaging Review Dg Chest 2 View  04/15/2014   CLINICAL DATA:  Chest pain.  EXAM: CHEST  2 VIEW  COMPARISON:  April 07, 2014.  FINDINGS: The heart size and mediastinal contours are within normal limits. Both lungs are clear. No pneumothorax or pleural effusion is noted. The visualized skeletal structures are unremarkable.  IMPRESSION: No acute cardiopulmonary abnormality seen.   Electronically Signed   By:  Sabino Dick M.D.   On: 04/15/2014 19:30     EKG Interpretation None      MDM   Final diagnoses:  Chest pain, unspecified chest pain type  Gastritis  Nonintractable headache, unspecified chronicity pattern, unspecified headache type   Patient is a 24 year old African-American female with pertinent past medical history of anxiety who comes to the emergency department today with nausea, vomiting, epigastric abdominal pain radiating into the chest, and headache for the past week. Notably patient's father died a week ago immediately prior to the onset of the patient's symptoms began. Patient does not have any exertional component to her pain as result I doubt an MI. Initial workup initiated in triage included an i-STAT troponin, CBC, CMP, chest x-ray, and an EKG. EKG demonstrated normal sinus rhythm with no ST elevation or depression and no T-wave inversions. I-STAT troponin was negative. CBC is unremarkable. CMP is unremarkable. Chest x-ray demonstrated no consolidations making pneumonia unlikely there was no evidence of pneumothorax.  With 5 days of symptoms I do not feel the patient requires  a repeat troponin at this time. Patient states that she has been utilizing approximately10 pills of Excedrin migraine for the past week for headaches and has had worsening of her abdominal pain since then I feel this is likely consistent with gastritis as a result she was treated with a GI cocktail. For the patient's headache she was treated with Reglan IM. Patient was unable to be reevaluated because she told the nurses that she felt significantly improved and left prior to my ability to discuss her discharge with her. Prior to that patient had been instructed to decrease her Excedrin use and to ensure that she follows up with her primary care physician. Labs and imaging reviewed by myself and considered in medical decision making. Imaging was interpreted by radiology. Care was discussed with my attending Dr.  Audie Pinto.     Katheren Shams, MD 04/16/14 [place], MD 04/17/14 [phone]

## 2014-04-25 ENCOUNTER — Encounter (HOSPITAL_COMMUNITY): Payer: Self-pay | Admitting: Emergency Medicine

## 2014-04-25 ENCOUNTER — Emergency Department (HOSPITAL_COMMUNITY): Payer: Self-pay

## 2014-04-25 ENCOUNTER — Emergency Department (HOSPITAL_COMMUNITY)
Admission: EM | Admit: 2014-04-25 | Discharge: 2014-04-25 | Disposition: A | Discharge status: Home / Self Care | Payer: Self-pay | Attending: Emergency Medicine | Admitting: Emergency Medicine

## 2014-04-25 DIAGNOSIS — R5383 Other fatigue: Secondary | ICD-10-CM | POA: Insufficient documentation

## 2014-04-25 DIAGNOSIS — R112 Nausea with vomiting, unspecified: Secondary | ICD-10-CM | POA: Insufficient documentation

## 2014-04-25 DIAGNOSIS — R0981 Nasal congestion: Secondary | ICD-10-CM | POA: Insufficient documentation

## 2014-04-25 DIAGNOSIS — Z8719 Personal history of other diseases of the digestive system: Secondary | ICD-10-CM | POA: Insufficient documentation

## 2014-04-25 DIAGNOSIS — R531 Weakness: Secondary | ICD-10-CM | POA: Insufficient documentation

## 2014-04-25 DIAGNOSIS — R05 Cough: Secondary | ICD-10-CM | POA: Insufficient documentation

## 2014-04-25 DIAGNOSIS — R059 Cough, unspecified: Secondary | ICD-10-CM

## 2014-04-25 DIAGNOSIS — M791 Myalgia: Secondary | ICD-10-CM | POA: Insufficient documentation

## 2014-04-25 DIAGNOSIS — R52 Pain, unspecified: Secondary | ICD-10-CM | POA: Insufficient documentation

## 2014-04-25 DIAGNOSIS — R067 Sneezing: Secondary | ICD-10-CM | POA: Insufficient documentation

## 2014-04-25 DIAGNOSIS — Z3202 Encounter for pregnancy test, result negative: Secondary | ICD-10-CM | POA: Insufficient documentation

## 2014-04-25 DIAGNOSIS — Z880 Allergy status to penicillin: Secondary | ICD-10-CM | POA: Insufficient documentation

## 2014-04-25 DIAGNOSIS — R509 Fever, unspecified: Secondary | ICD-10-CM | POA: Insufficient documentation

## 2014-04-25 DIAGNOSIS — I10 Essential (primary) hypertension: Secondary | ICD-10-CM | POA: Insufficient documentation

## 2014-04-25 DIAGNOSIS — Z87440 Personal history of urinary (tract) infections: Secondary | ICD-10-CM | POA: Insufficient documentation

## 2014-04-25 LAB — URINE MICROSCOPIC-ADD ON

## 2014-04-25 LAB — URINALYSIS, ROUTINE W REFLEX MICROSCOPIC
Bilirubin Urine: NEGATIVE
Glucose, UA: NEGATIVE mg/dL
Ketones, ur: NEGATIVE mg/dL
Leukocytes, UA: NEGATIVE
Nitrite: NEGATIVE
Protein, ur: NEGATIVE mg/dL
Specific Gravity, Urine: 1.026 (ref 1.005–1.030)
Urobilinogen, UA: 1 mg/dL (ref 0.0–1.0)
pH: 6.5 (ref 5.0–8.0)

## 2014-04-25 LAB — PREGNANCY, URINE: Preg Test, Ur: NEGATIVE

## 2014-04-25 MED ORDER — OXYCODONE-ACETAMINOPHEN 5-325 MG PO TABS
1.0000 | ORAL_TABLET | Freq: Once | ORAL | Status: AC
  Administered 2014-04-25: 1 via ORAL
  Filled 2014-04-25: qty 1

## 2014-04-25 MED ORDER — METOCLOPRAMIDE HCL 10 MG PO TABS: 10.0000 mg | ORAL_TABLET | Freq: Three times a day (TID) | ORAL | Status: DC | PRN

## 2014-04-25 MED ORDER — METOCLOPRAMIDE HCL 10 MG PO TABS
10.0000 mg | ORAL_TABLET | Freq: Once | ORAL | Status: AC
  Administered 2014-04-25: 10 mg via ORAL
  Filled 2014-04-25 (×2): qty 1

## 2014-04-25 NOTE — ED Provider Notes (Signed)
CSN: [id number]     Arrival date & time 04/25/14  1852 History   First MD Initiated Contact with Patient 04/25/14 2146     Chief Complaint  Patient presents with  . Generalized Body Aches     (Consider location/radiation/quality/duration/timing/severity/associated sxs/prior Treatment) The history is provided by the patient and medical records.   Gracelyn Coventry is 24 year old female with a past medical history of chronic headache presents to the ED with a 6 day history of URI symptoms and generalized body aches. Patient reports having a cough, rhinorrhea, nausea, vomiting, headache, subjective fever, and generalized body aches. Cough is productive with a thick yellow sputum but denies hemoptysis. Pain is 9/10, described as aching, not relieved with OTC medication, and associated with previously stated symptoms. Nyquil and tylenol cold attempted without alleviation of symptoms- last dose was yesterday. Patient did not receive a flu shot this year and reports possible sick contacts (grandmother and mother). Denies hematemesis, otalgia, chest pain, SOB, neck pain, abdominal pain, diarrhea, constipation, dysuria, and urinary frequency. Vital signs stable on arrival.   Past Medical History  Diagnosis Date  . UTI (lower urinary tract infection)   . Headache(784.0)     chronic  . Gallstones   . Hypertension     GHTN with last pg  . Pregnancy induced hypertension    Past Surgical History  Procedure Laterality Date  . Cholecystectomy N/A 07/27/2012    Procedure: LAPAROSCOPIC CHOLECYSTECTOMY WITH INTRAOPERATIVE CHOLANGIOGRAM;  Surgeon: Madilyn Hook, DO;  Location: [facility];  Service: General;  Laterality: N/A;  . Appendectomy    . Laparoscopic appendectomy N/A 12/09/2012    Procedure: APPENDECTOMY LAPAROSCOPIC;  Surgeon: Gwenyth Ober, MD;  Location: [facility] OR;  Service: General;  Laterality: N/A;   Family History  Problem Relation Age of Onset  . Asthma Mother   . Hypertension Mother   .  Parkinsonism Mother   . Diabetes Maternal Grandmother   . Hypertension Maternal Grandmother   . Stroke Maternal Grandmother   . Anesthesia problems Neg Hx   . Malignant hyperthermia Neg Hx   . Pseudochol deficiency Neg Hx   . Hypotension Neg Hx   . Asthma Brother    History  Substance Use Topics  . Smoking status: Never Smoker   . Smokeless tobacco: Never Used  . Alcohol Use: 0.0 oz/week     Comment: social   OB History    Gravida Para Term Preterm AB TAB SAB Ectopic Multiple Living   2 2 1 1  0 0 0 0 0 2     Review of Systems  Constitutional: Positive for fever, chills and fatigue.  HENT: Positive for congestion, sneezing and sore throat.   Respiratory: Positive for cough (productive).   Gastrointestinal: Positive for nausea and vomiting. Negative for diarrhea.  Musculoskeletal: Positive for myalgias and arthralgias.  Neurological: Positive for weakness (generalized) and headaches.  All other systems reviewed and are negative.     Allergies  Amoxicillin; Dilaudid; and Iohexol  Home Medications   Prior to Admission medications   Medication Sig Start Date End Date Taking? Authorizing Provider  aspirin-acetaminophen-caffeine (EXCEDRIN MIGRAINE) [phone] MG per tablet Take 1 tablet by mouth every 6 (six) hours as needed for headache.    Historical Provider, MD  levonorgestrel (MIRENA) 20 MCG/24HR IUD 1 each by Intrauterine route once. Implanted end of May 2014    Historical Provider, MD  ondansetron (ZOFRAN ODT) 4 MG disintegrating tablet Take 1 tablet (4 mg total) by mouth every 8 (  eight) hours as needed for nausea or vomiting. Patient not taking: Reported on 03/03/2014 11/17/13   Garald Balding, NP  promethazine (PHENERGAN) 25 MG suppository Place 1 suppository (25 mg total) rectally every 6 (six) hours as needed for nausea or vomiting. Patient not taking: Reported on 04/15/2014 04/07/14   Francine Graven, DO  promethazine (PHENERGAN) 25 MG tablet Take 1 tablet (25 mg  total) by mouth every 6 (six) hours as needed for nausea or vomiting. 04/07/14   Francine Graven, DO   BP 154/94 mmHg  Pulse 104  Temp(Src) 98.9 F (37.2 C) (Oral)  Resp 20  Ht 5\' 1"  (1.549 m)  Wt 210 lb (95.255 kg)  BMI 39.70 kg/m2  SpO2 99%   Physical Exam  Constitutional: She is oriented to person, place, and time. She appears well-developed and well-nourished.  Non-toxic appearance. No distress.  HENT:  Head: Normocephalic and atraumatic.  Right Ear: Tympanic membrane normal.  Left Ear: Tympanic membrane normal.  Mouth/Throat: Uvula is midline and mucous membranes are normal. Posterior oropharyngeal erythema (mild) present. No oropharyngeal exudate, posterior oropharyngeal edema or tonsillar abscesses.  Tonsils normal in appearance bilaterally without exudate; uvula midline without peritonsillar abscess; handling secretions appropriately; no difficulty swallowing or speaking  Eyes: Conjunctivae and EOM are normal. Pupils are equal, round, and reactive to light.  Neck: Normal range of motion and full passive range of motion without pain. Neck supple. No spinous process tenderness and no muscular tenderness present. No rigidity.  No meningismus  Cardiovascular: Normal rate, regular rhythm and normal heart sounds.   Pulmonary/Chest: Effort normal and breath sounds normal. No respiratory distress. She has no wheezes.  Abdominal: Soft. Bowel sounds are normal. There is no tenderness. There is no rigidity and no guarding.  Musculoskeletal: Normal range of motion.  Neurological: She is alert and oriented to person, place, and time.  AAOx3, answering questions appropriately; equal strength UE and LE bilaterally; CN grossly intact; moves all extremities appropriately without ataxia; no focal neuro deficits or facial asymmetry appreciated  Skin: Skin is warm and dry. She is not diaphoretic.  Psychiatric: She has a normal mood and affect.  Nursing note and vitals reviewed.   ED Course   Procedures (including critical care time) Labs Review Labs Reviewed  URINALYSIS, ROUTINE W REFLEX MICROSCOPIC - Abnormal; Notable for the following:    Hgb urine dipstick TRACE (*)    All other components within normal limits  URINE MICROSCOPIC-ADD ON - Abnormal; Notable for the following:    Squamous Epithelial / LPF FEW (*)    Bacteria, UA FEW (*)    All other components within normal limits  PREGNANCY, URINE    Imaging Review Dg Chest 2 View  04/25/2014   CLINICAL DATA:  Vomiting and general body aches for 4 days.  EXAM: CHEST  2 VIEW  COMPARISON:  04/15/2014  FINDINGS: The heart size and mediastinal contours are within normal limits. Both lungs are clear. The visualized skeletal structures are unremarkable.  IMPRESSION: No active cardiopulmonary disease.   Electronically Signed   By: Lucienne Capers M.D.   On: 04/25/2014 19:52     EKG Interpretation None      MDM   Final diagnoses:  Body aches  Nausea and vomiting, vomiting of unspecified type  Cough   24 year old female with cough, body aches, and fatigue for the past week. She has had sick contacts with her mother and grandmother who have been sick with pneumonia. She denies any chest pain or shortness  of breath. On exam, patient afebrile and nontoxic in appearance. Her lung sounds are clear and exam is overall noninfectious. Her abdominal exam is benign. Her mucous membranes are moist and she does not appear dehydrated. Chest x-ray is clear.  Patient was given dose of Percocet and Reglan, was able to tolerate PO without difficulty. She states she is feeling better and is ready to be discharged home. She was encouraged to follow with [facility].  Discussed plan with patient, he/she acknowledged understanding and agreed with plan of care.  Return precautions given for new or worsening symptoms.  Larene Pickett, PA-C 04/26/14 0003  Quintella Reichert, MD 04/26/14 [phone]

## 2014-04-25 NOTE — ED Notes (Signed)
Pt to ED for evaluation of generalized body aches, weakness, and nausea for the past week.  Admits to productive cough, denies pain.

## 2014-04-25 NOTE — Discharge Instructions (Signed)
Take the prescribed medication as directed. Follow-up with the [facility]. Return to the ED for new or worsening symptoms.

## 2014-05-17 ENCOUNTER — Encounter (HOSPITAL_COMMUNITY): Payer: Self-pay | Admitting: Physical Medicine and Rehabilitation

## 2014-05-17 ENCOUNTER — Emergency Department (HOSPITAL_COMMUNITY): Payer: Self-pay

## 2014-05-17 ENCOUNTER — Emergency Department (HOSPITAL_COMMUNITY)
Admission: EM | Admit: 2014-05-17 | Discharge: 2014-05-17 | Disposition: A | Discharge status: Home / Self Care | Payer: Self-pay | Attending: Emergency Medicine | Admitting: Emergency Medicine

## 2014-05-17 DIAGNOSIS — J069 Acute upper respiratory infection, unspecified: Secondary | ICD-10-CM | POA: Insufficient documentation

## 2014-05-17 DIAGNOSIS — R05 Cough: Secondary | ICD-10-CM

## 2014-05-17 DIAGNOSIS — Z880 Allergy status to penicillin: Secondary | ICD-10-CM | POA: Insufficient documentation

## 2014-05-17 DIAGNOSIS — Z87440 Personal history of urinary (tract) infections: Secondary | ICD-10-CM | POA: Insufficient documentation

## 2014-05-17 DIAGNOSIS — R059 Cough, unspecified: Secondary | ICD-10-CM

## 2014-05-17 DIAGNOSIS — I10 Essential (primary) hypertension: Secondary | ICD-10-CM | POA: Insufficient documentation

## 2014-05-17 DIAGNOSIS — Z8719 Personal history of other diseases of the digestive system: Secondary | ICD-10-CM | POA: Insufficient documentation

## 2014-05-17 DIAGNOSIS — J209 Acute bronchitis, unspecified: Secondary | ICD-10-CM | POA: Insufficient documentation

## 2014-05-17 LAB — CBC WITH DIFFERENTIAL/PLATELET
Basophils Absolute: 0.1 K/uL (ref 0.0–0.1)
Basophils Relative: 1 % (ref 0–1)
Eosinophils Absolute: 0.6 K/uL (ref 0.0–0.7)
Eosinophils Relative: 6 % — ABNORMAL HIGH (ref 0–5)
HCT: 37.5 % (ref 36.0–46.0)
Hemoglobin: 12.6 g/dL (ref 12.0–15.0)
Lymphocytes Relative: 24 % (ref 12–46)
Lymphs Abs: 2.4 K/uL (ref 0.7–4.0)
MCH: 27.6 pg (ref 26.0–34.0)
MCHC: 33.6 g/dL (ref 30.0–36.0)
MCV: 82.2 fL (ref 78.0–100.0)
Monocytes Absolute: 0.5 K/uL (ref 0.1–1.0)
Monocytes Relative: 5 % (ref 3–12)
Neutro Abs: 6.2 K/uL (ref 1.7–7.7)
Neutrophils Relative %: 64 % (ref 43–77)
Platelets: ADEQUATE K/uL (ref 150–400)
RBC: 4.56 MIL/uL (ref 3.87–5.11)
RDW: 14.3 % (ref 11.5–15.5)
Smear Review: ADEQUATE
WBC: 9.8 K/uL (ref 4.0–10.5)

## 2014-05-17 LAB — I-STAT TROPONIN, ED: Troponin i, poc: 0 ng/mL (ref 0.00–0.08)

## 2014-05-17 LAB — RAPID STREP SCREEN (MED CTR MEBANE ONLY): Streptococcus, Group A Screen (Direct): NEGATIVE

## 2014-05-17 LAB — BASIC METABOLIC PANEL
Anion gap: 6 (ref 5–15)
BUN: 7 mg/dL (ref 6–23)
CO2: 24 mmol/L (ref 19–32)
Calcium: 9.1 mg/dL (ref 8.4–10.5)
Chloride: 109 mmol/L (ref 96–112)
Creatinine, Ser: 0.59 mg/dL (ref 0.50–1.10)
GFR calc Af Amer: >90 mL/min (ref >90)
GFR calc non Af Amer: >90 mL/min (ref >90)
Glucose, Bld: 84 mg/dL (ref 70–99)
Potassium: 3.8 mmol/L (ref 3.5–5.1)
Sodium: 139 mmol/L (ref 135–145)

## 2014-05-17 MED ORDER — SODIUM CHLORIDE 0.9 % IV BOLUS (SEPSIS)
1000.0000 mL | Freq: Once | INTRAVENOUS | Status: AC
  Administered 2014-05-17: 1000 mL via INTRAVENOUS

## 2014-05-17 MED ORDER — PREDNISONE 20 MG PO TABS
60.0000 mg | ORAL_TABLET | Freq: Once | ORAL | Status: AC
  Administered 2014-05-17: 60 mg via ORAL
  Filled 2014-05-17: qty 3

## 2014-05-17 MED ORDER — ALBUTEROL SULFATE HFA 108 (90 BASE) MCG/ACT IN AERS
2.0000 | INHALATION_SPRAY | Freq: Once | RESPIRATORY_TRACT | Status: AC
  Administered 2014-05-17: 2 via RESPIRATORY_TRACT
  Filled 2014-05-17: qty 6.7

## 2014-05-17 MED ORDER — AZITHROMYCIN 250 MG PO TABS: ORAL_TABLET | ORAL | Status: DC

## 2014-05-17 MED ORDER — IBUPROFEN 800 MG PO TABS
800.0000 mg | ORAL_TABLET | Freq: Once | ORAL | Status: AC
  Administered 2014-05-17: 800 mg via ORAL
  Filled 2014-05-17: qty 1

## 2014-05-17 MED ORDER — PREDNISONE 20 MG PO TABS: 20.0000 mg | ORAL_TABLET | Freq: Two times a day (BID) | ORAL | Status: DC

## 2014-05-17 NOTE — ED Notes (Signed)
Patient transported to X-ray 

## 2014-05-17 NOTE — ED Provider Notes (Signed)
CSN: [id number]     Arrival date & time 05/17/14  0857 History   First MD Initiated Contact with Patient 05/17/14 [phone]     Chief Complaint  Patient presents with  . Nasal Congestion  . Cough  . Sore Throat     (Consider location/radiation/quality/duration/timing/severity/associated sxs/prior Treatment) HPI Emma Chambers is a 24 year-old female who presents the ER complaining of one month of nasal congestion, cough, sore throat and chest pain. Patient states her symptoms of nasal congestion and sore throat began first, and have persisted over the past month. Patient stated after partially one week she began having a sharp midsternal chest pain which has been intermittent. Patient states the pain is worsened with cough, however she also experiences the pain intermittently when she is not coughing. She states is mild shortness of breath associated with her chest pain, and reports associated lightheadedness. She states over the past several days she is noted in worsening of her symptoms including a more productive cough. Patient denies fever, dysphagia, throat swelling, headache, blurred vision, nausea, vomiting, abdominal pain, dysuria.  Past Medical History  Diagnosis Date  . UTI (lower urinary tract infection)   . Headache(784.0)     chronic  . Gallstones   . Hypertension     GHTN with last pg  . Pregnancy induced hypertension    Past Surgical History  Procedure Laterality Date  . Cholecystectomy N/A 07/27/2012    Procedure: LAPAROSCOPIC CHOLECYSTECTOMY WITH INTRAOPERATIVE CHOLANGIOGRAM;  Surgeon: Madilyn Hook, DO;  Location: [facility];  Service: General;  Laterality: N/A;  . Appendectomy    . Laparoscopic appendectomy N/A 12/09/2012    Procedure: APPENDECTOMY LAPAROSCOPIC;  Surgeon: Gwenyth Ober, MD;  Location: [facility] OR;  Service: General;  Laterality: N/A;   Family History  Problem Relation Age of Onset  . Asthma Mother   . Hypertension Mother   . Parkinsonism Mother   . Diabetes  Maternal Grandmother   . Hypertension Maternal Grandmother   . Stroke Maternal Grandmother   . Anesthesia problems Neg Hx   . Malignant hyperthermia Neg Hx   . Pseudochol deficiency Neg Hx   . Hypotension Neg Hx   . Asthma Brother    History  Substance Use Topics  . Smoking status: Never Smoker   . Smokeless tobacco: Never Used  . Alcohol Use: 0.0 oz/week     Comment: social   OB History    Gravida Para Term Preterm AB TAB SAB Ectopic Multiple Living   2 2 1 1  0 0 0 0 0 2     Review of Systems  Constitutional: Negative for fever.  HENT: Positive for sore throat. Negative for trouble swallowing.   Eyes: Negative for visual disturbance.  Respiratory: Positive for cough and shortness of breath.   Cardiovascular: Negative for chest pain.  Gastrointestinal: Negative for nausea, vomiting and abdominal pain.  Genitourinary: Negative for dysuria.  Musculoskeletal: Negative for neck pain.       Chest wall pain  Skin: Negative for rash.  Neurological: Negative for dizziness, weakness and numbness.  Psychiatric/Behavioral: Negative.       Allergies  Amoxicillin; Dilaudid; and Iohexol  Home Medications   Prior to Admission medications   Medication Sig Start Date End Date Taking? Authorizing Provider  guaiFENesin (ROBITUSSIN) 100 MG/5ML liquid Take 200 mg by mouth 3 (three) times daily as needed for cough.   Yes Historical Provider, MD  pseudoephedrine-acetaminophen (TYLENOL SINUS) 30-500 MG TABS Take 2 tablets by mouth every 4 (four) hours  as needed (cold symptoms).   Yes Historical Provider, MD  aspirin-acetaminophen-caffeine (EXCEDRIN MIGRAINE) [phone] MG per tablet Take 1 tablet by mouth every 6 (six) hours as needed for headache.    Historical Provider, MD  azithromycin (ZITHROMAX Z-PAK) 250 MG tablet 2 po day one, then 1 daily x 4 days 05/17/14   Evelina Bucy, MD  levonorgestrel [facility]) 20 MCG/24HR IUD 1 each by Intrauterine route once. Implanted end of May 2014     Historical Provider, MD  metoCLOPramide (REGLAN) 10 MG tablet Take 1 tablet (10 mg total) by mouth 3 (three) times daily as needed for nausea (headache / nausea). Patient not taking: Reported on 05/17/2014 04/25/14   Larene Pickett, PA-C  ondansetron (ZOFRAN ODT) 4 MG disintegrating tablet Take 1 tablet (4 mg total) by mouth every 8 (eight) hours as needed for nausea or vomiting. Patient not taking: Reported on 03/03/2014 11/17/13   Garald Balding, NP  predniSONE (DELTASONE) 20 MG tablet Take 1 tablet (20 mg total) by mouth 2 (two) times daily with a meal. 05/17/14   Carrie Mew, PA-C  promethazine (PHENERGAN) 25 MG suppository Place 1 suppository (25 mg total) rectally every 6 (six) hours as needed for nausea or vomiting. Patient not taking: Reported on 04/15/2014 04/07/14   Francine Graven, DO  ranitidine (ZANTAC) 150 MG tablet Take 150 mg by mouth daily as needed for heartburn.    Historical Provider, MD   BP 131/93 mmHg  Pulse 81  Temp(Src) 98.5 F (36.9 C) (Oral)  Resp 19  SpO2 100% Physical Exam  Constitutional: She is oriented to person, place, and time. She appears well-developed and well-nourished. No distress.  HENT:  Head: Normocephalic and atraumatic.  Mouth/Throat: Uvula is midline and oropharynx is clear and moist. No trismus in the jaw. No uvula swelling. No oropharyngeal exudate, posterior oropharyngeal edema, posterior oropharyngeal erythema or tonsillar abscesses.  Eyes: Right eye exhibits no discharge. Left eye exhibits no discharge. No scleral icterus.  Neck: Normal range of motion and full passive range of motion without pain. Neck supple. No spinous process tenderness and no muscular tenderness present. No rigidity. No edema, no erythema and normal range of motion present.  Cardiovascular: Normal rate, regular rhythm, S1 normal, S2 normal and normal heart sounds.   No murmur heard. Pulmonary/Chest: Effort normal and breath sounds normal. No accessory muscle usage. No  tachypnea. No respiratory distress.    Abdominal: Soft. Normal appearance and bowel sounds are normal. There is no tenderness. There is no rigidity, no guarding, no tenderness at McBurney's point and negative Murphy's sign.  Musculoskeletal: Normal range of motion. She exhibits no edema or tenderness.  Neurological: She is alert and oriented to person, place, and time. She has normal strength. No cranial nerve deficit or sensory deficit. Coordination normal. GCS eye subscore is 4. GCS verbal subscore is 5. GCS motor subscore is 6.  Patient fully alert, answering questions appropriately in full, clear sentences. Cranial nerves II through XII grossly intact. Motor strength 5 out of 5 in all major muscle groups of upper and lower extremities. Distal sensation intact.  Skin: Skin is warm and dry. No rash noted. She is not diaphoretic.  Psychiatric: She has a normal mood and affect.  Nursing note and vitals reviewed.   ED Course  Procedures (including critical care time) Labs Review Labs Reviewed  CBC WITH DIFFERENTIAL/PLATELET - Abnormal; Notable for the following:    Eosinophils Relative 6 (*)    All other components within normal  limits  RAPID STREP SCREEN  CULTURE, GROUP A STREP  BASIC METABOLIC PANEL  I-STAT TROPOININ, ED    Imaging Review Dg Chest 2 View  05/17/2014   CLINICAL DATA:  Cough for 1 month  EXAM: CHEST  2 VIEW  COMPARISON:  04/25/2014  FINDINGS: Cardiomediastinal silhouette is stable. No acute infiltrate or pleural effusion. No pulmonary edema. Bony thorax is unremarkable.  IMPRESSION: No active cardiopulmonary disease.   Electronically Signed   By: Lahoma Crocker M.D.   On: 05/17/2014 10:19     EKG Interpretation   Date/Time:  Tuesday May 17 2014 09:00:24 EST Ventricular Rate:  95 PR Interval:  142 QRS Duration: 72 QT Interval:  356 QTC Calculation: 447 R Axis:   53 Text Interpretation:  Normal sinus rhythm Nonspecific T wave abnormality  Abnormal ECG No  significant change since last tracing Confirmed by Mingo Amber   MD, [place] ([id number]) on 05/17/2014 11:12:07 AM      MDM   Final diagnoses:  Cough  URI (upper respiratory infection)  Acute bronchitis, unspecified organism    Patient here with ongoing persistent symptoms of upper respiratory infection. Of note patient has had a workup for chest pain twice in the past 2 months. Chest pain is pleuritic in nature, reproducible with palpation and with coughing. Patient has had several workups for this similar chest pain which have been negative. EKG unremarkable for acute injury or ectopy. Troponin negative. PERC negative, patient at very low risk for PE based on per criteria as well as the timing of her symptoms. No concern for ACS. Likely patient has viral bronchitis, we'll discharge with albuterol inhaler, prednisone, azithromycin due to time of complaint. I discussed return precautions with patient, and strongly encouraged to follow-up with a primary care provider. Provided patient with a resource guide to help her find one. I encouraged patient to call or return to the ER with any worsening of symptoms or should she have any questions or concerns.  BP 131/93 mmHg  Pulse 81  Temp(Src) 98.5 F (36.9 C) (Oral)  Resp 19  SpO2 100%  Signed,  Dahlia Bailiff, PA-C 4:53 PM  Patient discussed with Dr. Evelina Bucy, MD    Carrie Mew, PA-C 05/17/14 [place], MD 05/18/14 [phone]

## 2014-05-17 NOTE — ED Notes (Signed)
Patient states she has to leave due to family emergency. Emma Kirks, MD. Ok to d/c fluids and get her discharged.

## 2014-05-17 NOTE — ED Notes (Signed)
Pt presents to department for evaluation of nasal congestion, productive cough and sore throat. Ongoing for several days. No signs of acute distress noted.

## 2014-05-17 NOTE — ED Notes (Signed)
Patient returned from X-ray 

## 2014-05-17 NOTE — Discharge Instructions (Signed)
Acute Bronchitis Bronchitis is inflammation of the airways that extend from the windpipe into the lungs (bronchi). The inflammation often causes mucus to develop. This leads to a cough, which is the most common symptom of bronchitis.  In acute bronchitis, the condition usually develops suddenly and goes away over time, usually in a couple weeks. Smoking, allergies, and asthma can make bronchitis worse. Repeated episodes of bronchitis may cause further lung problems.  CAUSES Acute bronchitis is most often caused by the same virus that causes a cold. The virus can spread from person to person (contagious) through coughing, sneezing, and touching contaminated objects. SIGNS AND SYMPTOMS   Cough.   Fever.   Coughing up mucus.   Body aches.   Chest congestion.   Chills.   Shortness of breath.   Sore throat.  DIAGNOSIS  Acute bronchitis is usually diagnosed through a physical exam. Your health care provider will also ask you questions about your medical history. Tests, such as chest X-rays, are sometimes done to rule out other conditions.  TREATMENT  Acute bronchitis usually goes away in a couple weeks. Oftentimes, no medical treatment is necessary. Medicines are sometimes given for relief of fever or cough. Antibiotic medicines are usually not needed but may be prescribed in certain situations. In some cases, an inhaler may be recommended to help reduce shortness of breath and control the cough. A cool mist vaporizer may also be used to help thin bronchial secretions and make it easier to clear the chest.  HOME CARE INSTRUCTIONS  Get plenty of rest.   Drink enough fluids to keep your urine clear or pale yellow (unless you have a medical condition that requires fluid restriction). Increasing fluids may help thin your respiratory secretions (sputum) and reduce chest congestion, and it will prevent dehydration.   Take medicines only as directed by your health care provider.  If  you were prescribed an antibiotic medicine, finish it all even if you start to feel better.  Avoid smoking and secondhand smoke. Exposure to cigarette smoke or irritating chemicals will make bronchitis worse. If you are a smoker, consider using nicotine gum or skin patches to help control withdrawal symptoms. Quitting smoking will help your lungs heal faster.   Reduce the chances of another bout of acute bronchitis by washing your hands frequently, avoiding people with cold symptoms, and trying not to touch your hands to your mouth, nose, or eyes.   Keep all follow-up visits as directed by your health care provider.  SEEK MEDICAL CARE IF: Your symptoms do not improve after 1 week of treatment.  SEEK IMMEDIATE MEDICAL CARE IF:  You develop an increased fever or chills.   You have chest pain.   You have severe shortness of breath.  You have bloody sputum.   You develop dehydration.  You faint or repeatedly feel like you are going to pass out.  You develop repeated vomiting.  You develop a severe headache. MAKE SURE YOU:   Understand these instructions.  Will watch your condition.  Will get help right away if you are not doing well or get worse. Document Released: 04/11/2004 Document Revised: 07/19/2013 Document Reviewed: 08/25/2012 [facility] Patient Information 2015 [place], [place]. This information is not intended to replace advice given to you by your health care provider. Make sure you discuss any questions you have with your health care provider.   Emergency Department Resource Guide 1) Find a Doctor and Pay Out of Pocket Although you won't have to find out who is  covered by your insurance plan, it is a good idea to ask around and get recommendations. You will then need to call the office and see if the doctor you have chosen will accept you as a new patient and what types of options they offer for patients who are self-pay. Some doctors offer discounts or will set up  payment plans for their patients who do not have insurance, but you will need to ask so you aren't surprised when you get to your appointment.  2) Contact Your Local Health Department Not all health departments have doctors that can see patients for sick visits, but many do, so it is worth a call to see if yours does. If you don't know where your local health department is, you can check in your phone book. The CDC also has a tool to help you locate your state's health department, and many state websites also have listings of all of their local health departments.  3) Find a [facility] If your illness is not likely to be very severe or complicated, you may want to try a walk in clinic. These are popping up all over the country in pharmacies, drugstores, and shopping centers. They're usually staffed by nurse practitioners or physician assistants that have been trained to treat common illnesses and complaints. They're usually fairly quick and inexpensive. However, if you have serious medical issues or chronic medical problems, these are probably not your best option.  No Primary Care Doctor: - Call Health Connect at  [phone] - they can help you locate a primary care doctor that  accepts your insurance, provides certain services, etc. - Physician Referral Service- [phone]  Chronic Pain Problems: Organization         Address  Phone   Notes  [facility]  [phone] Patients need to be referred by their primary care doctor.   Medication Assistance: Organization         Address  Phone   Notes  [facility] Medication [facility] [address] [place], [place] [address] [phone] --Must be a resident of [facility] -- Must have NO insurance coverage whatsoever (no Medicaid/ Medicare, etc.) -- The pt. MUST have a primary care doctor that directs their care regularly and follows them in the community   [facility]ist  [phone]   [employer]  [phone]    Agencies that provide inexpensive medical care: Organization         Address  Phone   Notes  [place]  [phone]   [facility]    [phone]   [facility] [place], [place] [address] [phone]   [place] [address] [place] [phone]   Planned Parenthood    [phone]   [facility]    [phone]   [place] and [address], [place] Phone:  [phone], Fax:  [phone] Hours of Operation:  9 am - 6 pm, M-F.  Also accepts Medicaid/Medicare and self-pay.  [facility] for [place], [address], [place] Phone: [phone], Fax: [phone]. Hours of Operation:  8:30 am - 5:30 pm, M-F.  Also accepts Medicaid and self-pay.  [facility] [place] [address], [place] Phone: [phone]   [place], [place], [place] [phone], Ext.  123 Mondays & Thursdays: 7-9 AM.  First 15 patients are seen on a first come, first serve basis.    [place] Providers:  Organization         Address  Phone   Notes  [facility] [address], [place] [phone] Also accepts self-pay patients.  [facility] [address], [place]  [phone]   [place], [address], [place] [phone]   [facility] [address] [place] [phone]   Lucianne Lei [address], [place]   [phone] Only accepts [place] Access [place] patients after they have their name applied to their card.   Self-Pay (no insurance) in [facility]:  Organization         Address  Phone   Notes  Sickle Cell Patients, [facility] [place]  [phone]   [facility]gent [facility] [place] [phone]   [facility] [place]  [place], [place], [place] [phone]   [facility]/Dr. Osei-Bonsu  [address], [place] or [address], [place] [phone] Phone number for both [place] and [place] locations is the same.  [facility] [address] [place] [phone]   [facility] [address], [place] or [address] [phone]   [facility] [address], [place] [phone], phone; [phone], fax Sees patients 1st and 3rd Saturday of every month.  Must not qualify for public or private insurance (i.e. Medicaid, Medicare, [place] Health Choice, Veterans' Benefits)  Household income should be no more than 200% of the poverty level The clinic cannot treat you if you are pregnant or think you are pregnant  Sexually transmitted diseases are not treated at the clinic.    Dental Care: Organization         Address  Phone  Notes  [facility] Department of [facility] [place] [phone] Accepts children up to age 24 who are enrolled in [place] or [place]; pregnant women with a Medicaid card; and children who have applied for Medicaid or [place] Health Choice, but were declined, whose parents can pay a reduced fee at time of service.  [facility] of [facility]  [address], [place] [phone] Accepts children up to age 75 who are enrolled in [place] or [place]; pregnant women with a Medicaid card; and children who have applied for Medicaid or [place] Health Choice, but were declined, whose parents can pay a reduced fee at time of service.  [facility] Adult Dental Access PROGRAM  [place] [phone] Patients  are seen by appointment only. Walk-ins are not accepted. [place] will see patients 73 years of age and older. Monday - Tuesday (8am-5pm) Most Wednesdays (8:30-5pm) $30 per visit, cash only  [facility] Adult Dental Access PROGRAM  [address], [facility] [phone] Patients are seen by appointment only. Walk-ins are not accepted. [place] will see patients 100 years of age and older. One Wednesday Evening (Monthly: Volunteer Based).  $30 per visit, cash only  [place]  [phone] for  adults; Children under age 69, call Graduate Pediatric Dentistry at [phone]. Children aged 81-14, please call [phone] to request a pediatric application.  Dental services are provided in all areas of dental care including fillings, crowns and bridges, complete and partial dentures, implants, gum treatment, root canals, and extractions. Preventive care is also provided. Treatment is provided to both adults and children. Patients are selected via a lottery and there is often a waiting list.   [facility] [address], [place]  [phone] [email or web address]   [facility] [address] [place], [place] [phone], Ext. [phone] Second and Fourth Thursday of each month, opens at 6:30 AM; Clinic ends at 9 AM.  Patients are seen on a first-come first-served basis, and a limited number are seen during each clinic.   [facility]  [address] [place], [place] [phone]   Eligibility Requirements You must have lived in [place], [place], or [place] counties for at least the last three months.   You cannot be eligible for state or federal sponsored [employer], including [employer], [place], or [employer].   You generally cannot be eligible for healthcare insurance through your employer.    How to apply: Eligibility screenings are held every Tuesday and Wednesday afternoon from 1:00 pm until  4:00 pm. You do not need an appointment for the interview!  [facility] [address] [place], [place]   House  [place] Department  [place]  [phone]    Behavioral Health Resources in the Community: Intensive Outpatient Programs Organization         Address  Phone  Notes  [facility] [place]. [address] [place], [place] [phone]   [facility] [address] [place], [place]   ADS: Alcohol & Drug Svcs [address], [place], [place]   [place] [address],  [place], [place] or [phone]   Substance Abuse Resources Organization         Address  Phone  Notes  Alcohol and Drug Services  [phone]   [place]  [phone]   [facility] [place]   Chinita Pester  [phone]   Residential & Outpatient Substance Abuse Program  [phone]   Psychological Services Organization         Address  Phone  Notes  [facility] [place]  [place]  [phone]   [name] [address] [place] or [phone]    Mobile Crisis Teams Organization         Address  Phone  Notes  [facility], Mobile Crisis Care Unit  [phone]   [facility]ive Psychotherapeutic Services  [address] [place], [place]   [place] Levels [address] [place] [phone]    Self-Help/Support Groups Organization         Address  Phone             Notes  [place]. of  - variety of support groups  [place] Call for more information  Narcotics Anonymous (NA), [facility]aring [facility] [address], [employer] [place]  2 meetings at this location   Brewing technologist  Notes  ASAP  Residential Treatment [address]    [place]  [phone]   [facility]  [address] [place] [address], [place], [place]   [place], [place] [phone] Admissions: 8am-3pm M-F  Incentives Substance Croom [address]    [place], [place] [phone]   [facility] [address] [place], [place], [place]   [facility] [address]  [place], [place]   [facility] Programs - Intensive Outpatient [address], [place], [place]   [facility] ([place].,  [place], [place] [phone] or [phone]   [facility] (RTS) [address], [place], [place] Accepts Medicaid  [facility] [address],  [place] [phone] Substance Abuse/Addiction Treatment   [facility] Organization         Address  Phone  Notes  [facility]  [phone]   Domenic Schwab, PhD [address] Arlis Porta [place], [place]   [phone] or [phone]   [place], [place] [phone]   [facility] [address] [place], [place] [phone] Insurance/Medicaid/sponsorship through [facility] [place] and Families [address] [place]                                    [place], [place] [phone] [place] [address], [place] [phone]    Dr. Adele Schilder  [phone]   [facility] Dept. 1) [address], [place] 2) [place] 3)  [address], [place] [phone]  [phone]   [place] [phone] or [phone] (After Hours)

## 2014-05-17 NOTE — ED Notes (Signed)
Pt reports Cp with cough . Pt also reports CP when not coughing . Pt had a recent ED visit with C/O cp.

## 2014-05-19 LAB — CULTURE, GROUP A STREP: Strep A Culture: POSITIVE — AB

## 2014-05-24 ENCOUNTER — Telehealth (HOSPITAL_BASED_OUTPATIENT_CLINIC_OR_DEPARTMENT_OTHER): Payer: Self-pay | Admitting: Emergency Medicine

## 2014-05-24 ENCOUNTER — Encounter (HOSPITAL_COMMUNITY): Payer: Self-pay

## 2014-05-24 ENCOUNTER — Emergency Department (HOSPITAL_COMMUNITY)
Admission: EM | Admit: 2014-05-24 | Discharge: 2014-05-24 | Discharge status: Against Medical Advice | Payer: Self-pay | Attending: Emergency Medicine | Admitting: Emergency Medicine

## 2014-05-24 DIAGNOSIS — H9209 Otalgia, unspecified ear: Secondary | ICD-10-CM | POA: Insufficient documentation

## 2014-05-24 DIAGNOSIS — I10 Essential (primary) hypertension: Secondary | ICD-10-CM | POA: Insufficient documentation

## 2014-05-24 DIAGNOSIS — R079 Chest pain, unspecified: Secondary | ICD-10-CM | POA: Insufficient documentation

## 2014-05-24 DIAGNOSIS — R6884 Jaw pain: Secondary | ICD-10-CM | POA: Insufficient documentation

## 2014-05-24 NOTE — Telephone Encounter (Signed)
Post ED Visit - Positive Culture Follow-up  Culture report reviewed by antimicrobial stewardship pharmacist: []  Wes Dulaney, Pharm.D., BCPS [x]  Heide Guile, Pharm.D., BCPS []  Alycia Rossetti, Pharm.D., BCPS []  [place], Pharm.D., BCPS, AAHIVP []  Legrand Como, Pharm.D., BCPS, AAHIVP []  Isac Sarna, Pharm.D., BCPS  Positive strep culture Treated with azithromycin, organism sensitive to the same and no further patient follow-up is required at this time.  Hazle Nordmann 05/24/2014, 3:06 PM

## 2014-05-24 NOTE — ED Notes (Signed)
Went to update patient vitals.  Patient did not answer.

## 2014-05-24 NOTE — ED Notes (Signed)
Patient not located.  Patient does not answer calls.    Removing patient from system.

## 2014-05-24 NOTE — ED Notes (Signed)
Pt reports chest pain,onset 1 month ago seen for same and given abx and sterioids, pt reports ear and jaw pain also.

## 2014-05-24 NOTE — ED Notes (Signed)
Called patient to update vitals.   Patient does not answer.

## 2014-06-29 ENCOUNTER — Emergency Department (HOSPITAL_COMMUNITY)
Admission: EM | Admit: 2014-06-29 | Discharge: 2014-06-29 | Disposition: A | Discharge status: Home / Self Care | Payer: Self-pay | Attending: Emergency Medicine | Admitting: Emergency Medicine

## 2014-06-29 ENCOUNTER — Encounter (HOSPITAL_COMMUNITY): Payer: Self-pay | Admitting: *Deleted

## 2014-06-29 ENCOUNTER — Emergency Department (HOSPITAL_COMMUNITY): Payer: Self-pay

## 2014-06-29 DIAGNOSIS — M25552 Pain in left hip: Secondary | ICD-10-CM | POA: Insufficient documentation

## 2014-06-29 DIAGNOSIS — Z8719 Personal history of other diseases of the digestive system: Secondary | ICD-10-CM | POA: Insufficient documentation

## 2014-06-29 DIAGNOSIS — Y9389 Activity, other specified: Secondary | ICD-10-CM | POA: Insufficient documentation

## 2014-06-29 DIAGNOSIS — Y998 Other external cause status: Secondary | ICD-10-CM | POA: Insufficient documentation

## 2014-06-29 DIAGNOSIS — I10 Essential (primary) hypertension: Secondary | ICD-10-CM | POA: Insufficient documentation

## 2014-06-29 DIAGNOSIS — Z880 Allergy status to penicillin: Secondary | ICD-10-CM | POA: Insufficient documentation

## 2014-06-29 DIAGNOSIS — R52 Pain, unspecified: Secondary | ICD-10-CM

## 2014-06-29 DIAGNOSIS — S3992XA Unspecified injury of lower back, initial encounter: Secondary | ICD-10-CM | POA: Insufficient documentation

## 2014-06-29 DIAGNOSIS — Z3202 Encounter for pregnancy test, result negative: Secondary | ICD-10-CM | POA: Insufficient documentation

## 2014-06-29 DIAGNOSIS — Z7951 Long term (current) use of inhaled steroids: Secondary | ICD-10-CM | POA: Insufficient documentation

## 2014-06-29 DIAGNOSIS — M25551 Pain in right hip: Secondary | ICD-10-CM | POA: Insufficient documentation

## 2014-06-29 DIAGNOSIS — Y92410 Unspecified street and highway as the place of occurrence of the external cause: Secondary | ICD-10-CM | POA: Insufficient documentation

## 2014-06-29 DIAGNOSIS — Z87440 Personal history of urinary (tract) infections: Secondary | ICD-10-CM | POA: Insufficient documentation

## 2014-06-29 LAB — I-STAT BETA HCG BLOOD, ED (MC, WL, AP ONLY): I-stat hCG, quantitative: <5 m[IU]/mL (ref <5)

## 2014-06-29 LAB — CBC WITH DIFFERENTIAL/PLATELET
Basophils Absolute: 0 10*3/uL (ref 0.0–0.1)
Basophils Relative: 0 % (ref 0–1)
Eosinophils Absolute: 0.6 10*3/uL (ref 0.0–0.7)
Eosinophils Relative: 5 % (ref 0–5)
HCT: 37.3 % (ref 36.0–46.0)
Hemoglobin: 12.3 g/dL (ref 12.0–15.0)
Lymphocytes Relative: 29 % (ref 12–46)
Lymphs Abs: 3.2 10*3/uL (ref 0.7–4.0)
MCH: 27.4 pg (ref 26.0–34.0)
MCHC: 33 g/dL (ref 30.0–36.0)
MCV: 83.1 fL (ref 78.0–100.0)
Monocytes Absolute: 0.6 10*3/uL (ref 0.1–1.0)
Monocytes Relative: 6 % (ref 3–12)
Neutro Abs: 6.6 10*3/uL (ref 1.7–7.7)
Neutrophils Relative %: 60 % (ref 43–77)
Platelets: 330 10*3/uL (ref 150–400)
RBC: 4.49 MIL/uL (ref 3.87–5.11)
RDW: 14.1 % (ref 11.5–15.5)
WBC: 11.1 10*3/uL — ABNORMAL HIGH (ref 4.0–10.5)

## 2014-06-29 LAB — BASIC METABOLIC PANEL
Anion gap: 8 (ref 5–15)
BUN: 9 mg/dL (ref 6–23)
CO2: 25 mmol/L (ref 19–32)
Calcium: 9.2 mg/dL (ref 8.4–10.5)
Chloride: 106 mmol/L (ref 96–112)
Creatinine, Ser: 0.74 mg/dL (ref 0.50–1.10)
GFR calc Af Amer: >90 mL/min (ref >90)
GFR calc non Af Amer: >90 mL/min (ref >90)
Glucose, Bld: 85 mg/dL (ref 70–99)
Potassium: 3.6 mmol/L (ref 3.5–5.1)
Sodium: 139 mmol/L (ref 135–145)

## 2014-06-29 LAB — SEDIMENTATION RATE: Sed Rate: 40 mm/hr — ABNORMAL HIGH (ref 0–22)

## 2014-06-29 MED ORDER — METHOCARBAMOL 500 MG PO TABS: 500.0000 mg | ORAL_TABLET | Freq: Four times a day (QID) | ORAL | Status: DC | PRN

## 2014-06-29 MED ORDER — HYDROCODONE-ACETAMINOPHEN 5-325 MG PO TABS
1.0000 | ORAL_TABLET | Freq: Once | ORAL | Status: AC
  Administered 2014-06-29: 1 via ORAL
  Filled 2014-06-29: qty 1

## 2014-06-29 MED ORDER — IBUPROFEN 800 MG PO TABS: 800.0000 mg | ORAL_TABLET | Freq: Three times a day (TID) | ORAL | Status: DC | PRN

## 2014-06-29 NOTE — ED Notes (Signed)
Pt reports right hip pain for a week and a temp of 103 in the past day.  No difficulty with bowel or bladder.

## 2014-06-29 NOTE — ED Notes (Signed)
The pt is c/o rt hip pain for one week.  No known injury  lmp none iud

## 2014-06-29 NOTE — Discharge Instructions (Signed)
Read the information below.  Use the prescribed medication as directed.  Please discuss all new medications with your pharmacist.  You may return to the Emergency Department at any time for worsening condition or any new symptoms that concern you.  If you develop uncontrolled pain, weakness or numbness of the extremity, severe discoloration of the skin, or you are unable to walk, return to the ER for a recheck.     Hip Pain Your hip is the joint between your upper legs and your lower pelvis. The bones, cartilage, tendons, and muscles of your hip joint perform a lot of work each day supporting your body weight and allowing you to move around. Hip pain can range from a minor ache to severe pain in one or both of your hips. Pain may be felt on the inside of the hip joint near the groin, or the outside near the buttocks and upper thigh. You may have swelling or stiffness as well.  HOME CARE INSTRUCTIONS   Take medicines only as directed by your health care provider.  Apply ice to the injured area:  Put ice in a plastic bag.  Place a towel between your skin and the bag.  Leave the ice on for 15-20 minutes at a time, 3-4 times a day.  Keep your leg raised (elevated) when possible to lessen swelling.  Avoid activities that cause pain.  Follow specific exercises as directed by your health care provider.  Sleep with a pillow between your legs on your most comfortable side.  Record how often you have hip pain, the location of the pain, and what it feels like. SEEK MEDICAL CARE IF:   You are unable to put weight on your leg.  Your hip is red or swollen or very tender to touch.  Your pain or swelling continues or worsens after 1 week.  You have increasing difficulty walking.  You have a fever. SEEK IMMEDIATE MEDICAL CARE IF:   You have fallen.  You have a sudden increase in pain and swelling in your hip. MAKE SURE YOU:   Understand these instructions.  Will watch your  condition.  Will get help right away if you are not doing well or get worse. Document Released: 08/22/2009 Document Revised: 07/19/2013 Document Reviewed: 10/29/2012 [facility] Patient Information 2015 [place], [place]. This information is not intended to replace advice given to you by your health care provider. Make sure you discuss any questions you have with your health care provider.   Emergency Department Resource Guide 1) Find a Doctor and Pay Out of Pocket Although you won't have to find out who is covered by your insurance plan, it is a good idea to ask around and get recommendations. You will then need to call the office and see if the doctor you have chosen will accept you as a new patient and what types of options they offer for patients who are self-pay. Some doctors offer discounts or will set up payment plans for their patients who do not have insurance, but you will need to ask so you aren't surprised when you get to your appointment.  2) Contact Your Local Health Department Not all health departments have doctors that can see patients for sick visits, but many do, so it is worth a call to see if yours does. If you don't know where your local health department is, you can check in your phone book. The CDC also has a tool to help you locate your state's health department, and  many state websites also have listings of all of their local health departments.  3) Find a [facility] If your illness is not likely to be very severe or complicated, you may want to try a walk in clinic. These are popping up all over the country in pharmacies, drugstores, and shopping centers. They're usually staffed by nurse practitioners or physician assistants that have been trained to treat common illnesses and complaints. They're usually fairly quick and inexpensive. However, if you have serious medical issues or chronic medical problems, these are probably not your best option.  No Primary Care  Doctor: - Call Health Connect at  [phone] - they can help you locate a primary care doctor that  accepts your insurance, provides certain services, etc. - Physician Referral Service- [phone]  Chronic Pain Problems: Organization         Address  Phone   Notes  [facility]  [phone] Patients need to be referred by their primary care doctor.   Medication Assistance: Organization         Address  Phone   Notes  [facility] Medication [facility] [address] [place], [place] [address] [phone] --Must be a resident of [facility] -- Must have NO insurance coverage whatsoever (no Medicaid/ Medicare, etc.) -- The pt. MUST have a primary care doctor that directs their care regularly and follows them in the community   MedAssist  [phone]   [employer]  [phone]    Agencies that provide inexpensive medical care: Organization         Address  Phone   Notes  [place]  [phone]   [facility]    [phone]   [facility] [address], [place] [address] [phone]   [place] [address] [place] [phone]   Planned Parenthood    [phone]   [facility]    [phone]   [place] and [place] [address], [place] Phone:  [phone], Fax:  [phone] Hours of Operation:  9 am - 6 pm, M-F.  Also accepts Medicaid/Medicare and self-pay.  [facility] for [place], [address], [place] Phone: [phone], Fax: [phone]. Hours of Operation:  8:30 am - 5:30 pm, M-F.  Also accepts Medicaid and self-pay.  [facility] [place] [address], [place] Phone: [phone]   [place], [place], [place] [phone], Ext. [phone] Mondays & Thursdays: 7-9 AM.  First 15 patients are seen on a first  come, first serve basis.    [place] Providers:  Organization         Address  Phone   Notes  [facility] [address],  [phone] Also accepts self-pay patients.  [facility] [address], [place]  [phone]   [place], [address], [place] [phone]   [facility] [address] [place] [phone]   Lucianne Lei [address], [place]   [phone] Only accepts [place] Access [place] patients after they have their name applied to their card.   Self-Pay (no insurance) in [facility]:  Organization  Address  Phone   Notes  Sickle Cell Patients, [facility] [place] [phone]   [facility] [place] [phone]   [facility] [place]  [place], [address], [place] [phone]   [facility]/Dr. Osei-Bonsu  [address], [place] or [address], [place] [phone] Phone number for both [place] and [place] locations is the same.  Urgent Medical and [facility] [address] [place] [phone]   [facility] ([facility]) [address], [place] or [address] [phone]   [facility] [address] [place] [phone], phone; [phone], fax Sees patients 1st and 3rd Saturday of every month.  Must not qualify for public or private insurance (i.e. Medicaid, Medicare, [place] Health Choice, Veterans' Benefits)  Household income should be no more than 200% of the poverty level The clinic cannot treat you if you are pregnant or think you are pregnant  Sexually transmitted diseases are not treated at the clinic.    Dental Care: Organization          Address  Phone  Notes  [facility] Department of [facility] [place] [phone] Accepts children up to age 57 who are enrolled in [place] or [place]; pregnant women with a Medicaid card; and children who have applied for Medicaid or [place] Health Choice, but were declined, whose parents can pay a reduced fee at time of service.  [facility] Department of [facility]  [address], [place] [phone] Accepts children up to age 67 who are enrolled in [place] or [place]; pregnant women with a Medicaid card; and children who have applied for Medicaid or [place] Health Choice, but were declined, whose parents can pay a reduced fee at time of service.  [facility] PRO[facility]  [place] [phone] Patients are seen by appointment only. Walk-ins are not accepted. [place] will see patients 49 years of age and older. Monday - Tuesday (8am-5pm) Most Wednesdays (8:30-5pm) $30 per visit, cash only  [facility] Adult Dental Access PROGRAM  [address], [facility] [phone] Patients are seen by appointment only. Walk-ins are not accepted. [place] will see patients 38 years of age and older. One Wednesday Evening (Monthly: Volunteer Based).  $30 per visit, cash only  [place]  [phone] for adults; Children under age 59, call Graduate Pediatric Dentistry at [phone]. Children aged 77-14, please call [phone] to request a pediatric application.  Dental services are provided in all areas of dental care including fillings, crowns and bridges, complete and partial dentures, implants, gum treatment, root canals, and extractions. Preventive care is also provided. Treatment is provided to both adults and children. Patients are selected via a lottery and there is often a waiting list.   [facility] [address], [place] Kitara Hebb  [phone] [email or web address]   [facility] [address] [place], [place] [phone], Ext. [phone] Second and Fourth Thursday of each month, opens at 6:30 AM; Clinic ends at 9 AM.  Patients are seen on a first-come first-served basis, and a limited number are seen during each clinic.   [facility]  [address] [place], [place]  [place], [place] [phone]   Eligibility Requirements You must have lived in [place], [place], or [place] counties for at least the last three months.   You cannot be eligible for state or federal sponsored [employer], including [employer], [place], or [employer].   You generally cannot be eligible for healthcare insurance through your employer.    How to apply: Eligibility screenings are held every Tuesday and Wednesday afternoon from 1:00 pm until 4:00 pm. You do not need an appointment for the interview!  [facility] [address], [place], [place]   [place]  [place] Department  [place]  [phone]    Behavioral Health Resources in the Community: Intensive Outpatient Programs Organization         Address  Phone  Notes  [place]. [address], [place], [place] [phone]   [facility] Outpatient [address] [place], [place]   ADS: Alcohol & Drug Svcs [address] [place], [place]   [place] [address]  [place], [place] or [phone]   Substance Abuse Resources Organization         Address  Phone  Notes  Alcohol and Drug Services  [phone]   [place]  [phone]   The  [place]   Chinita Pester  [phone]   Residential & Outpatient Substance Abuse Program  [phone]   Psychological  Services Organization         Address  Phone  Notes  [facility] [place]  [place]  [phone]   [place] [address] [place] or [phone]    Mobile Crisis Teams Organization         Address  Phone  Notes  [facility], Mobile Crisis Care Unit  [phone]   [facility]  [address] [place], [place]   [place] [address],  [place] [phone]    Self-Help/Support Groups Organization         Address  Phone             Notes  [place]. of [place] - variety of support groups  [place] Call for more information  Narcotics Anonymous (NA), [facility]aring [facility] [address], [employer] [place]  2 meetings at this location   Special educational needs teacher         Address  Phone  Notes  [facility]idential [facility] [place],    [place]  [phone]   [facility]  [address] [place] 846659, [place], [place]   [place], [place] [phone] Admissions: 8am-3pm M-F  Incentives Substance [place] [name],    [place], [place] [phone]   [facility] [address] Jadene Pierini [place], [place]   [facility] [address],  [place], [place]   [facility]ight Programs - Intensive Outpatient [address], [place], [place]   [facility] (Centre.) [place].,  [place], [place] or [phone]   [facility] Services (RTS) [address], [place], [place] Accepts Medicaid  [facility] [place] [address],  [place] [phone] Substance Abuse/Addiction Treatment   [facility] Resources Organization  Address  Phone  Notes  [facility]  [phone]   Domenic Schwab, PhD [address] Arlis Porta [place], [place]   [phone] or [phone]   [place] [address] [place], [place] [phone]   [address], [place], [place] [phone] Insurance/Medicaid/sponsorship through [facility] and Families [address] [place]                                    [place], [place] [phone] [place] [address], [place] [phone]    Dr. Adele Schilder  [phone]   [facility] Dept. 1) [address] [place] 2) [place] 3)  [address], [place] [phone]  [phone]   [place] [phone] or [phone] (After Hours)
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
## 2014-06-30 LAB — C-REACTIVE PROTEIN: CRP: 1.3 mg/dL — ABNORMAL HIGH (ref <0.60)

## 2014-08-05 ENCOUNTER — Emergency Department (HOSPITAL_COMMUNITY)
Admission: EM | Admit: 2014-08-05 | Discharge: 2014-08-05 | Disposition: A | Discharge status: Home / Self Care | Payer: Self-pay | Attending: Emergency Medicine | Admitting: Emergency Medicine

## 2014-08-05 ENCOUNTER — Encounter (HOSPITAL_COMMUNITY): Payer: Self-pay | Admitting: *Deleted

## 2014-08-05 DIAGNOSIS — Z3202 Encounter for pregnancy test, result negative: Secondary | ICD-10-CM | POA: Insufficient documentation

## 2014-08-05 DIAGNOSIS — Z880 Allergy status to penicillin: Secondary | ICD-10-CM | POA: Insufficient documentation

## 2014-08-05 DIAGNOSIS — Z87440 Personal history of urinary (tract) infections: Secondary | ICD-10-CM | POA: Insufficient documentation

## 2014-08-05 DIAGNOSIS — Z792 Long term (current) use of antibiotics: Secondary | ICD-10-CM | POA: Insufficient documentation

## 2014-08-05 DIAGNOSIS — Z8719 Personal history of other diseases of the digestive system: Secondary | ICD-10-CM | POA: Insufficient documentation

## 2014-08-05 DIAGNOSIS — Z79899 Other long term (current) drug therapy: Secondary | ICD-10-CM | POA: Insufficient documentation

## 2014-08-05 DIAGNOSIS — Z8679 Personal history of other diseases of the circulatory system: Secondary | ICD-10-CM | POA: Insufficient documentation

## 2014-08-05 DIAGNOSIS — N939 Abnormal uterine and vaginal bleeding, unspecified: Secondary | ICD-10-CM | POA: Insufficient documentation

## 2014-08-05 DIAGNOSIS — Z7952 Long term (current) use of systemic steroids: Secondary | ICD-10-CM | POA: Insufficient documentation

## 2014-08-05 DIAGNOSIS — G8929 Other chronic pain: Secondary | ICD-10-CM | POA: Insufficient documentation

## 2014-08-05 DIAGNOSIS — R112 Nausea with vomiting, unspecified: Secondary | ICD-10-CM | POA: Insufficient documentation

## 2014-08-05 LAB — COMPREHENSIVE METABOLIC PANEL
ALT: 11 U/L — ABNORMAL LOW (ref 14–54)
AST: 19 U/L (ref 15–41)
Albumin: 4.1 g/dL (ref 3.5–5.0)
Alkaline Phosphatase: 70 U/L (ref 38–126)
Anion gap: 7 (ref 5–15)
BUN: 8 mg/dL (ref 6–20)
CO2: 26 mmol/L (ref 22–32)
Calcium: 9.1 mg/dL (ref 8.9–10.3)
Chloride: 105 mmol/L (ref 101–111)
Creatinine, Ser: 0.66 mg/dL (ref 0.44–1.00)
GFR calc Af Amer: >60 mL/min (ref >60)
GFR calc non Af Amer: >60 mL/min (ref >60)
Glucose, Bld: 82 mg/dL (ref 65–99)
Potassium: 3.8 mmol/L (ref 3.5–5.1)
Sodium: 138 mmol/L (ref 135–145)
Total Bilirubin: 0.5 mg/dL (ref 0.3–1.2)
Total Protein: 7.3 g/dL (ref 6.5–8.1)

## 2014-08-05 LAB — URINALYSIS, ROUTINE W REFLEX MICROSCOPIC
Bilirubin Urine: NEGATIVE
Glucose, UA: NEGATIVE mg/dL
Ketones, ur: 15 mg/dL — AB
Leukocytes, UA: NEGATIVE
Nitrite: NEGATIVE
Protein, ur: NEGATIVE mg/dL
Specific Gravity, Urine: 1.03 (ref 1.005–1.030)
Urobilinogen, UA: 0.2 mg/dL (ref 0.0–1.0)
pH: 5 (ref 5.0–8.0)

## 2014-08-05 LAB — WET PREP, GENITAL
Clue Cells Wet Prep HPF POC: NONE SEEN
Trich, Wet Prep: NONE SEEN
Yeast Wet Prep HPF POC: NONE SEEN

## 2014-08-05 LAB — URINE MICROSCOPIC-ADD ON

## 2014-08-05 LAB — CBC WITH DIFFERENTIAL/PLATELET
Basophils Absolute: 0 10*3/uL (ref 0.0–0.1)
Basophils Relative: 0 % (ref 0–1)
Eosinophils Absolute: 0.5 10*3/uL (ref 0.0–0.7)
Eosinophils Relative: 6 % — ABNORMAL HIGH (ref 0–5)
HCT: 38 % (ref 36.0–46.0)
Hemoglobin: 12.2 g/dL (ref 12.0–15.0)
Lymphocytes Relative: 32 % (ref 12–46)
Lymphs Abs: 2.9 10*3/uL (ref 0.7–4.0)
MCH: 26.6 pg (ref 26.0–34.0)
MCHC: 32.1 g/dL (ref 30.0–36.0)
MCV: 83 fL (ref 78.0–100.0)
Monocytes Absolute: 0.4 10*3/uL (ref 0.1–1.0)
Monocytes Relative: 4 % (ref 3–12)
Neutro Abs: 5.2 10*3/uL (ref 1.7–7.7)
Neutrophils Relative %: 58 % (ref 43–77)
Platelets: 337 10*3/uL (ref 150–400)
RBC: 4.58 MIL/uL (ref 3.87–5.11)
RDW: 14.2 % (ref 11.5–15.5)
WBC: 9 10*3/uL (ref 4.0–10.5)

## 2014-08-05 LAB — PREGNANCY, URINE: Preg Test, Ur: NEGATIVE

## 2014-08-05 MED ORDER — CEFTRIAXONE SODIUM 250 MG IJ SOLR
250.0000 mg | Freq: Once | INTRAMUSCULAR | Status: AC
  Administered 2014-08-05: 250 mg via INTRAMUSCULAR
  Filled 2014-08-05: qty 250

## 2014-08-05 MED ORDER — AZITHROMYCIN 250 MG PO TABS
1000.0000 mg | ORAL_TABLET | Freq: Once | ORAL | Status: AC
  Administered 2014-08-05: 1000 mg via ORAL
  Filled 2014-08-05: qty 4

## 2014-08-05 MED ORDER — LIDOCAINE HCL (PF) 1 % IJ SOLN
0.9000 mL | Freq: Once | INTRAMUSCULAR | Status: AC
  Administered 2014-08-05: 0.9 mL via INTRADERMAL
  Filled 2014-08-05: qty 5

## 2014-08-05 NOTE — ED Notes (Signed)
Pt reports n/v x 2 days, pt thinks she vomited blood this am. Also reports vaginal bleeding x 2 days and passing large clots. Normally doesn't have a menstrual due to having IUD in place. No acute distress noted at triage.

## 2014-08-05 NOTE — ED Notes (Signed)
Pt unable to obtain urine sample at this time 

## 2014-08-05 NOTE — Discharge Instructions (Signed)
Abnormal Uterine Bleeding Abnormal uterine bleeding can affect women at various stages in life, including teenagers, women in their reproductive years, pregnant women, and women who have reached menopause. Several kinds of uterine bleeding are considered abnormal, including:  Bleeding or spotting between periods.   Bleeding after sexual intercourse.   Bleeding that is heavier or more than normal.   Periods that last longer than usual.  Bleeding after menopause.  Many cases of abnormal uterine bleeding are minor and simple to treat, while others are more serious. Any type of abnormal bleeding should be evaluated by your health care provider. Treatment will depend on the cause of the bleeding. HOME CARE INSTRUCTIONS Monitor your condition for any changes. The following actions may help to alleviate any discomfort you are experiencing:  Avoid the use of tampons and douches as directed by your health care provider.  Change your pads frequently. You should get regular pelvic exams and Pap tests. Keep all follow-up appointments for diagnostic tests as directed by your health care provider.  SEEK MEDICAL CARE IF:   Your bleeding lasts more than 1 week.   You feel dizzy at times.  SEEK IMMEDIATE MEDICAL CARE IF:   You pass out.   You are changing pads every 15 to 30 minutes.   You have abdominal pain.  You have a fever.   You become sweaty or weak.   You are passing large blood clots from the vagina.   You start to feel nauseous and vomit. MAKE SURE YOU:   Understand these instructions.  Will watch your condition.  Will get help right away if you are not doing well or get worse. Document Released: 03/04/2005 Document Revised: 03/09/2013 Document Reviewed: 10/01/2012 [employer] Patient Information 2015 [employer]. This information is not intended to replace advice given to you by your health care provider. Make sure you discuss any questions you have with your  health care provider.  

## 2014-08-05 NOTE — ED Provider Notes (Signed)
CSN: [id number]     Arrival date & time 08/05/14  1449 History  This chart was scribed for non-physician practitioner, Domenic Moras, PA-C,working with Daleen Bo, MD, by Marlowe Kays, ED Scribe. This patient was seen in room TR03C/TR03C and the patient's care was started at 4:09 PM.  Chief Complaint  Patient presents with  . Abdominal Pain  . Vaginal Bleeding   HPI  HPI Comments:  Emma Chambers is a 24 y.o. obese female who presents to the Emergency Department complaining of abdominal pain that began approximately two days ago. She reports associated vaginal bleeding with blood clots. She describes the pain as constant, sharp and throbbing. She reports 6 episodes of emesis and reports associated nausea. Her last bowel movement was two days ago and was normal. She states this is normal for her to have a BM every two days. Pt has not taken anything to relieve her symptoms but has been applying warm rags to her abdomen. Eating makes her vomit. Denies alleviating factors. Denies diarrhea, constipation, fever, chills, dysuria, hematuria, vaginal discharge or dyspareunia. She reports being sexually active with the same partner. Pt reports that she has an IUD in place.   Past Medical History  Diagnosis Date  . UTI (lower urinary tract infection)   . Headache(784.0)     chronic  . Gallstones   . Hypertension     GHTN with last pg  . Pregnancy induced hypertension    Past Surgical History  Procedure Laterality Date  . Cholecystectomy N/A 07/27/2012    Procedure: LAPAROSCOPIC CHOLECYSTECTOMY WITH INTRAOPERATIVE CHOLANGIOGRAM;  Surgeon: Madilyn Hook, DO;  Location: [facility] ORS;  Service: General;  Laterality: N/A;  . Appendectomy    . Laparoscopic appendectomy N/A 12/09/2012    Procedure: APPENDECTOMY LAPAROSCOPIC;  Surgeon: Gwenyth Ober, MD;  Location: [facility] OR;  Service: General;  Laterality: N/A;   Family History  Problem Relation Age of Onset  . Asthma Mother   . Hypertension Mother   .  Parkinsonism Mother   . Diabetes Maternal Grandmother   . Hypertension Maternal Grandmother   . Stroke Maternal Grandmother   . Anesthesia problems Neg Hx   . Malignant hyperthermia Neg Hx   . Pseudochol deficiency Neg Hx   . Hypotension Neg Hx   . Asthma Brother    History  Substance Use Topics  . Smoking status: Never Smoker   . Smokeless tobacco: Never Used  . Alcohol Use: 0.0 oz/week     Comment: social   OB History    Gravida Para Term Preterm AB TAB SAB Ectopic Multiple Living   2 2 1 1  0 0 0 0 0 2     Review of Systems  Constitutional: Negative for fever and chills.  Gastrointestinal: Positive for nausea, vomiting and abdominal pain. Negative for diarrhea and constipation.  Genitourinary: Positive for vaginal bleeding. Negative for dysuria, hematuria, vaginal discharge and dyspareunia.    Allergies  Amoxicillin; Dilaudid; and Iohexol  Home Medications   Prior to Admission medications   Medication Sig Start Date End Date Taking? Authorizing Provider  aspirin-acetaminophen-caffeine (EXCEDRIN MIGRAINE) [phone] MG per tablet Take 1 tablet by mouth every 6 (six) hours as needed for headache.    Historical Provider, MD  azithromycin (ZITHROMAX Z-PAK) 250 MG tablet 2 po day one, then 1 daily x 4 days 05/17/14   Evelina Bucy, MD  guaiFENesin (ROBITUSSIN) 100 MG/5ML liquid Take 200 mg by mouth 3 (three) times daily as needed for cough.    Historical  Provider, MD  ibuprofen (ADVIL,MOTRIN) 800 MG tablet Take 1 tablet (800 mg total) by mouth every 8 (eight) hours as needed for mild pain or moderate pain. 06/29/14   [name], PA-C  levonorgestrel (MIRENA) 20 MCG/24HR IUD 1 each by Intrauterine route once. Implanted end of May 2014    Historical Provider, MD  methocarbamol (ROBAXIN) 500 MG tablet Take 1 tablet (500 mg total) by mouth every 6 (six) hours as needed for muscle spasms (and pain). 06/29/14   [name], PA-C  metoCLOPramide (REGLAN) 10 MG tablet Take 1 tablet (10 mg  total) by mouth 3 (three) times daily as needed for nausea (headache / nausea). Patient not taking: Reported on 05/17/2014 04/25/14   Larene Pickett, PA-C  ondansetron (ZOFRAN ODT) 4 MG disintegrating tablet Take 1 tablet (4 mg total) by mouth every 8 (eight) hours as needed for nausea or vomiting. Patient not taking: Reported on 03/03/2014 11/17/13   Junius Creamer, NP  predniSONE (DELTASONE) 20 MG tablet Take 1 tablet (20 mg total) by mouth 2 (two) times daily with a meal. 05/17/14   Dahlia Bailiff, PA-C  promethazine (PHENERGAN) 25 MG suppository Place 1 suppository (25 mg total) rectally every 6 (six) hours as needed for nausea or vomiting. Patient not taking: Reported on 04/15/2014 04/07/14   Francine Graven, DO  pseudoephedrine-acetaminophen (TYLENOL SINUS) 30-500 MG TABS Take 2 tablets by mouth every 4 (four) hours as needed (cold symptoms).    Historical Provider, MD  ranitidine (ZANTAC) 150 MG tablet Take 150 mg by mouth daily as needed for heartburn.    Historical Provider, MD   Triage Vitals: BP 110/85 mmHg  Pulse 96  Temp(Src) 98.5 F (36.9 C) (Oral)  Resp 18  Ht 5\' 1"  (1.549 m)  Wt 225 lb 5 oz (102.201 kg)  BMI 42.59 kg/m2  SpO2 100% Physical Exam  Constitutional: She is oriented to person, place, and time. She appears well-developed and well-nourished.  HENT:  Head: Normocephalic and atraumatic.  Eyes: EOM are normal.  Neck: Normal range of motion.  Cardiovascular: Normal rate, regular rhythm and normal heart sounds.  Exam reveals no gallop and no friction rub.   No murmur heard. Pulmonary/Chest: Effort normal and breath sounds normal. No respiratory distress. She has no wheezes. She has no rales.  Abdominal: Soft. There is tenderness (lower abdomen). There is no rebound, no guarding and no CVA tenderness.  Genitourinary:  Chaperone present during exam.  No inguinal hernia or inguinal lymphadenopathy noted.  Normal external genitalia.  Blood noted in vaginal vault.  Closed cervical os with  IUD string in place.  On bimanual exam pt has R adnexal tenderness and CMT.  No mass appreciated.  Musculoskeletal: Normal range of motion.  Neurological: She is alert and oriented to person, place, and time.  Skin: Skin is warm and dry.  Psychiatric: She has a normal mood and affect. Her behavior is normal.  Nursing note and vitals reviewed.   ED Course  Procedures (including critical care time) DIAGNOSTIC STUDIES: Oxygen Saturation is 100% on RA, normal by my interpretation.   COORDINATION OF CARE: 4:09 PM- Will order urinalysis and perform pelvic exam. Pt verbalizes understanding and agrees to plan.  6:07 PM  Since pt did have pelvic pain, rocephin/zithromax given as prophylaxis for possible STD. Pt vomit up zithromax.  Her wet prep is unremarkable.  No anemia, remainder of her labs are reassuring.  Suspect bleeding from endometriosis/uterine fibroid.  [facility],  return precaution given.   Medications - No data to display  Labs Review Labs Reviewed  WET PREP, GENITAL - Abnormal; Notable for the following:    WBC, Wet Prep HPF POC FEW (*)    All other components within normal limits  CBC WITH DIFFERENTIAL/PLATELET - Abnormal; Notable for the following:    Eosinophils Relative 6 (*)    All other components within normal limits  COMPREHENSIVE METABOLIC PANEL - Abnormal; Notable for the following:    ALT 11 (*)    All other components within normal limits  URINALYSIS, ROUTINE W REFLEX MICROSCOPIC - Abnormal; Notable for the following:    Hgb urine dipstick LARGE (*)    Ketones, ur 15 (*)    All other components within normal limits  PREGNANCY, URINE  URINE MICROSCOPIC-ADD ON  RPR  HIV ANTIBODY (ROUTINE TESTING)  GC/CHLAMYDIA PROBE AMP ([place])    Imaging Review No results found.   EKG Interpretation None      MDM   Final diagnoses:  Abnormal uterine bleeding    BP 110/85 mmHg  Pulse 96  Temp(Src) 98.5 F (36.9 C)  (Oral)  Resp 18  Ht 5\' 1"  (1.549 m)  Wt 225 lb 5 oz (102.201 kg)  BMI 42.59 kg/m2  SpO2 100%   I personally performed the services described in this documentation, which was scribed in my presence. The recorded information has been reviewed and is accurate.    Domenic Moras, PA-C 08/05/14 1809  Pamella Pert, MD 08/05/14 [id number]

## 2014-08-06 LAB — RPR: RPR Ser Ql: NONREACTIVE

## 2014-08-06 LAB — HIV ANTIBODY (ROUTINE TESTING W REFLEX): HIV Screen 4th Generation wRfx: NONREACTIVE

## 2014-08-08 LAB — GC/CHLAMYDIA PROBE AMP (~~LOC~~) NOT AT ARMC
Chlamydia: NEGATIVE
Neisseria Gonorrhea: NEGATIVE

## 2014-08-11 ENCOUNTER — Emergency Department (HOSPITAL_COMMUNITY)
Admission: EM | Admit: 2014-08-11 | Discharge: 2014-08-11 | Disposition: A | Discharge status: Home / Self Care | Payer: Self-pay | Attending: Emergency Medicine | Admitting: Emergency Medicine

## 2014-08-11 ENCOUNTER — Encounter (HOSPITAL_COMMUNITY): Payer: Self-pay | Admitting: Emergency Medicine

## 2014-08-11 DIAGNOSIS — Z792 Long term (current) use of antibiotics: Secondary | ICD-10-CM | POA: Insufficient documentation

## 2014-08-11 DIAGNOSIS — R079 Chest pain, unspecified: Secondary | ICD-10-CM | POA: Insufficient documentation

## 2014-08-11 DIAGNOSIS — R6883 Chills (without fever): Secondary | ICD-10-CM | POA: Insufficient documentation

## 2014-08-11 DIAGNOSIS — R0602 Shortness of breath: Secondary | ICD-10-CM | POA: Insufficient documentation

## 2014-08-11 DIAGNOSIS — Z7952 Long term (current) use of systemic steroids: Secondary | ICD-10-CM | POA: Insufficient documentation

## 2014-08-11 DIAGNOSIS — Z87440 Personal history of urinary (tract) infections: Secondary | ICD-10-CM | POA: Insufficient documentation

## 2014-08-11 DIAGNOSIS — Z8679 Personal history of other diseases of the circulatory system: Secondary | ICD-10-CM | POA: Insufficient documentation

## 2014-08-11 DIAGNOSIS — Z79899 Other long term (current) drug therapy: Secondary | ICD-10-CM | POA: Insufficient documentation

## 2014-08-11 DIAGNOSIS — Z880 Allergy status to penicillin: Secondary | ICD-10-CM | POA: Insufficient documentation

## 2014-08-11 DIAGNOSIS — Z8719 Personal history of other diseases of the digestive system: Secondary | ICD-10-CM | POA: Insufficient documentation

## 2014-08-11 DIAGNOSIS — R112 Nausea with vomiting, unspecified: Secondary | ICD-10-CM | POA: Insufficient documentation

## 2014-08-11 DIAGNOSIS — G8929 Other chronic pain: Secondary | ICD-10-CM | POA: Insufficient documentation

## 2014-08-11 MED ORDER — GI COCKTAIL ~~LOC~~
30.0000 mL | Freq: Once | ORAL | Status: AC
  Administered 2014-08-11: 30 mL via ORAL
  Filled 2014-08-11: qty 30

## 2014-08-11 MED ORDER — ONDANSETRON 4 MG PO TBDP
4.0000 mg | ORAL_TABLET | Freq: Four times a day (QID) | ORAL | Status: DC | PRN
Start: 2014-08-11 — End: 2014-09-07

## 2014-08-11 MED ORDER — ONDANSETRON 4 MG PO TBDP
4.0000 mg | ORAL_TABLET | Freq: Once | ORAL | Status: AC
  Administered 2014-08-11: 4 mg via ORAL
  Filled 2014-08-11: qty 1

## 2014-08-11 NOTE — Discharge Instructions (Signed)
Nausea and Vomiting °Nausea is a sick feeling that often comes before throwing up (vomiting). Vomiting is a reflex where stomach contents come out of your mouth. Vomiting can cause severe loss of body fluids (dehydration). Children and elderly adults can become dehydrated quickly, especially if they also have diarrhea. Nausea and vomiting are symptoms of a condition or disease. It is important to find the cause of your symptoms. °CAUSES  °· Direct irritation of the stomach lining. This irritation can result from increased acid production (gastroesophageal reflux disease), infection, food poisoning, taking certain medicines (such as nonsteroidal anti-inflammatory drugs), alcohol use, or tobacco use. °· Signals from the brain. These signals could be caused by a headache, heat exposure, an inner ear disturbance, increased pressure in the brain from injury, infection, a tumor, or a concussion, pain, emotional stimulus, or metabolic problems. °· An obstruction in the gastrointestinal tract (bowel obstruction). °· Illnesses such as diabetes, hepatitis, gallbladder problems, appendicitis, kidney problems, cancer, sepsis, atypical symptoms of a heart attack, or eating disorders. °· Medical treatments such as chemotherapy and radiation. °· Receiving medicine that makes you sleep (general anesthetic) during surgery. °DIAGNOSIS °Your caregiver may ask for tests to be done if the problems do not improve after a few days. Tests may also be done if symptoms are severe or if the reason for the nausea and vomiting is not clear. Tests may include: °· Urine tests. °· Blood tests. °· Stool tests. °· Cultures (to look for evidence of infection). °· X-rays or other imaging studies. °Test results can help your caregiver make decisions about treatment or the need for additional tests. °TREATMENT °You need to stay well hydrated. Drink frequently but in small amounts. You may wish to drink water, sports drinks, clear broth, or eat frozen  ice pops or gelatin dessert to help stay hydrated. When you eat, eating slowly may help prevent nausea. There are also some antinausea medicines that may help prevent nausea. °HOME CARE INSTRUCTIONS  °· Take all medicine as directed by your caregiver. °· If you do not have an appetite, do not force yourself to eat. However, you must continue to drink fluids. °· If you have an appetite, eat a normal diet unless your caregiver tells you differently. °¨ Eat a variety of complex carbohydrates (rice, wheat, potatoes, bread), lean meats, yogurt, fruits, and vegetables. °¨ Avoid high-fat foods because they are more difficult to digest. °· Drink enough water and fluids to keep your urine clear or pale yellow. °· If you are dehydrated, ask your caregiver for specific rehydration instructions. Signs of dehydration may include: °¨ Severe thirst. °¨ Dry lips and mouth. °¨ Dizziness. °¨ Dark urine. °¨ Decreasing urine frequency and amount. °¨ Confusion. °¨ Rapid breathing or pulse. °SEEK IMMEDIATE MEDICAL CARE IF:  °· You have blood or brown flecks (like coffee grounds) in your vomit. °· You have black or bloody stools. °· You have a severe headache or stiff neck. °· You are confused. °· You have severe abdominal pain. °· You have chest pain or trouble breathing. °· You do not urinate at least once every 8 hours. °· You develop cold or clammy skin. °· You continue to vomit for longer than 24 to 48 hours. °· You have a fever. °MAKE SURE YOU:  °· Understand these instructions. °· Will watch your condition. °· Will get help right away if you are not doing well or get worse. °Document Released: 03/04/2005 Document Revised: 05/27/2011 Document Reviewed: 08/01/2010 °ExitCare® Patient Information ©2015 [employer]. This information is not intended   to replace advice given to you by your health care provider. Make sure you discuss any questions you have with your health care provider.   Emergency Department Resource Guide 1) Find a  Doctor and Pay Out of Pocket Although you won't have to find out who is covered by your insurance plan, it is a good idea to ask around and get recommendations. You will then need to call the office and see if the doctor you have chosen will accept you as a new patient and what types of options they offer for patients who are self-pay. Some doctors offer discounts or will set up payment plans for their patients who do not have insurance, but you will need to ask so you aren't surprised when you get to your appointment.  2) Contact Your Local Health Department Not all health departments have doctors that can see patients for sick visits, but many do, so it is worth a call to see if yours does. If you don't know where your local health department is, you can check in your phone book. The CDC also has a tool to help you locate your state's health department, and many state websites also have listings of all of their local health departments.  3) Find a [facility] If your illness is not likely to be very severe or complicated, you may want to try a walk in clinic. These are popping up all over the country in pharmacies, drugstores, and shopping centers. They're usually staffed by nurse practitioners or physician assistants that have been trained to treat common illnesses and complaints. They're usually fairly quick and inexpensive. However, if you have serious medical issues or chronic medical problems, these are probably not your best option.  No Primary Care Doctor: - Call Health Connect at  [phone] - they can help you locate a primary care doctor that  accepts your insurance, provides certain services, etc. - Physician Referral Service- [phone]  Chronic Pain Problems: Organization         Address  Phone   Notes  [facility]  [phone] Patients need to be referred by their primary care doctor.   Medication Assistance: Organization         Address  Phone    Notes  [facility] Medication [facility] [address] [place], [place] [address] [phone] --Must be a resident of [facility] -- Must have NO insurance coverage whatsoever (no Medicaid/ Medicare, etc.) -- The pt. MUST have a primary care doctor that directs their care regularly and follows them in the community   MedAssist  [phone]   [employer]  [phone]    Agencies that provide inexpensive medical care: Organization         Address  Phone   Notes  [place]  [phone]   [facility]    [phone]   [facility] [place], [place] [address] [phone]   [place] [address] [place] [phone]   Planned Parenthood    [phone]   [facility]    [phone]   [place] and [address], [place] Phone:  [phone], Fax:  [phone] Hours of Operation:  9 am - 6 pm, M-F.  Also accepts Medicaid/Medicare and self-pay.  [facility] for [place] [address], [address], [place]  Phone: [phone], Fax: [phone]. Hours of Operation:  8:30 am - 5:30 pm, M-F.  Also accepts Medicaid and self-pay.  [facility] [place] [address], [place] Phone: [phone]   [place], [place], [place] [phone], Ext. [phone] Mondays & Thursdays: 7-9 AM.  First 15 patients are seen on a first come, first serve basis.    [place] Providers:  Organization         Address  Phone   Notes  [facility] [address],  [phone] Also accepts self-pay patients.  [facility] [address], [place]  [phone]   [place], [address], [place] [phone]   [facility] [address] [place] [phone]   Lucianne Lei [address], [place]   [phone] Only accepts [place] Access [place] patients after they have their name applied to their card.   Self-Pay (no insurance) in [facility]:  Organization         Address  Phone   Notes  Sickle Cell Patients, [facility] [place] [phone]   [facility] [place] [phone]   [facility] [place]  [place], [place], [place] [phone]   [facility]/Dr. Osei-Bonsu  [address] [place] or [address], [place] [phone] Phone number for both [place] and [place] locations is the same.  [facility]gent Medical and [facility] [address] [place] [phone]   [facility] [address] [place] or [address] [phone]   [facility] [place] [address], [place] [phone], phone; [phone], fax Sees patients 1st and 3rd Saturday of every month.  Must not qualify for public or private insurance (i.e. Medicaid, Medicare, [place] Health Choice, Veterans' Benefits)  Household income should be no more than 200% of the poverty level The clinic cannot treat you if you are pregnant or think you are pregnant  Sexually transmitted diseases are not treated at the clinic.    Dental Care: Organization         Address  Phone  Notes  [facility] Department of [facility] [place] [phone] Accepts children up to age 12 who are enrolled in [place] or [place]; pregnant women with a Medicaid card; and children who have applied for Medicaid or [place] Health Choice, but were declined, whose parents can pay a reduced fee at time of service.  [facility] Department of [facility]  [address], [place] [phone] Accepts children up to age 23 who are enrolled in [place] or [place]; pregnant women with a Medicaid card; and children who have applied for Medicaid or [place] Health Choice, but were declined, whose parents can pay a reduced fee at time of service.  [place]  [place] [phone] Patients are seen by appointment only. Walk-ins are not accepted. [place] will see patients 46 years of age and older. Monday - Tuesday (8am-5pm) Most Wednesdays (8:30-5pm) $30 per visit, cash only  [place]  [address]  Dr, [facility] [phone] Patients are seen by appointment only. Walk-ins are not accepted. [place] will see patients 83 years of age and older. One Wednesday Evening (Monthly: Volunteer Based).  $30 per visit, cash only  [place]  [phone] for adults; Children under age 37, call Graduate Pediatric Dentistry at [phone]. Children aged 43-14, please call [phone] to request a pediatric application.  Dental services are provided in all areas of dental care including fillings, crowns and bridges, complete and partial dentures, implants, gum treatment, root canals, and extractions. Preventive care is also provided. Treatment is provided to both adults and children. Patients are selected via a lottery and there is often a waiting list.   [facility] [address] [place]  [phone] [email or web address]   [facility] [address] [place], [place] [phone], Ext. [phone] Second and Fourth Thursday of each month, opens at 6:30 AM; Clinic ends at 9 AM.  Patients are seen on a first-come first-served basis, and a limited number are seen during each clinic.   [facility]  [address] Hillard Danker [place], [place] [phone]   Eligibility Requirements You must have lived in  [place], [place], or [place] counties for at least the last three months.   You cannot be eligible for state or federal sponsored [employer], including [employer], [place], or [employer].   You generally cannot be eligible for healthcare insurance through your employer.    How to apply: Eligibility screenings are held every Tuesday and Wednesday afternoon from 1:00 pm until 4:00 pm. You do not need an appointment for the interview!  [facility] [address] [place], [place]   [place]  [place] Department  [place]  [phone]    Behavioral Health Resources in the Community: Intensive Outpatient Programs Organization         Address  Phone  Notes  [place]. [address] [place], [place] [phone]   [facility] [address], [place], [place]   [facility]: [facility] [address] [place], [place]   [place] [address],  [place], [place] or [phone]   Substance Abuse Resources Organization         Address  Phone  Notes  Alcohol and Drug Services  [phone]   [place]  [phone]   [facility]   Chinita Pester  [phone]   Residential & Outpatient Substance Abuse Program  [phone]   Psychological Services Organization         Address  Phone  Notes  [facility] Central  [place]  [phone]   [place] [address] [place] or [phone]    Mobile Crisis Teams Organization         Address  Phone  Notes  [facility], Mobile Crisis Care Unit  [phone]   [facility]  [address] [place], [place]   [place] [address] [place] [phone]    Self-Help/Support Groups Organization         Address  Phone             Notes  [place]. of [place] - variety of  support groups  [phone] Call for more information  Narcotics Anonymous (NA), [facility] [address], [employer] [place]  2 meetings at this location   Residential Facilities manager         Address  Phone  Notes  [facility] [place],    [place]  [phone]   [facility]  [address] [place] [address], [place], [place]   [place], [place] [phone] Admissions: 8am-3pm M-F  Incentives Substance [place] [address],    [place], [place] [phone]   [facility] [address] [place], [place], [place]   [facility] [address]  [place], [place]   [facility] - Intensive Outpatient [address], [place], [place]   [facility] [place].,  [place], [place] [phone] or [phone]   [facility] Services ([facility]S) [address], [place], [place] Accepts Medicaid  [facility] [place] [address]  [place] [phone] Substance Abuse/Addiction Treatment   [facility] Organization         Address  Phone  Notes  [facility]  [phone]   Domenic Schwab, PhD [address] Arlis Port[place], [place]   [phone] or [phone]   [place], [place] [phone]   [facility] [address] [place], [place] [phone] Insurance/Medicaid/sponsorship through [facility] and Families [address]                                    [place], [place] [phone] [place] [address], [place] [phone]    Dr. Adele Schilder  [phone]   [facility] Dept. 1) [address] [place] 2) [place] 3)  [place] [address], [place] [phone]  [phone]   [place] [phone] or [phone] (After Hours)

## 2014-08-11 NOTE — ED Provider Notes (Signed)
CSN: [id number]     Arrival date & time 08/11/14  1224 History   First MD Initiated Contact with Patient 08/11/14 [phone]     Chief Complaint  Patient presents with  . Nausea  . Emesis  . Shortness of Breath     (Consider location/radiation/quality/duration/timing/severity/associated sxs/prior Treatment) Patient is a 24 y.o. female presenting with vomiting and shortness of breath. The history is provided by the patient.  Emesis Severity:  Moderate Timing:  Constant Quality:  Stomach contents Progression:  Unchanged Chronicity:  New Recent urination:  Normal Relieved by:  Nothing Worsened by:  Nothing tried Associated symptoms: chills   Associated symptoms: no diarrhea, no fever and no URI   Risk factors: alcohol use (3 40 oz beers last night)   Shortness of Breath Associated symptoms: vomiting   Associated symptoms: no chest pain, no cough and no fever     Past Medical History  Diagnosis Date  . UTI (lower urinary tract infection)   . Headache(784.0)     chronic  . Gallstones   . Hypertension     GHTN with last pg  . Pregnancy induced hypertension    Past Surgical History  Procedure Laterality Date  . Cholecystectomy N/A 07/27/2012    Procedure: LAPAROSCOPIC CHOLECYSTECTOMY WITH INTRAOPERATIVE CHOLANGIOGRAM;  Surgeon: Madilyn Hook, DO;  Location: [facility] ORS;  Service: General;  Laterality: N/A;  . Appendectomy    . Laparoscopic appendectomy N/A 12/09/2012    Procedure: APPENDECTOMY LAPAROSCOPIC;  Surgeon: Gwenyth Ober, MD;  [facility];  Service: General;  Laterality: N/A;   Family History  Problem Relation Age of Onset  . Asthma Mother   . Hypertension Mother   . Parkinsonism Mother   . Diabetes Maternal Grandmother   . Hypertension Maternal Grandmother   . Stroke Maternal Grandmother   . Anesthesia problems Neg Hx   . Malignant hyperthermia Neg Hx   . Pseudochol deficiency Neg Hx   . Hypotension Neg Hx   . Asthma Brother    History  Substance Use Topics   . Smoking status: Never Smoker   . Smokeless tobacco: Never Used  . Alcohol Use: 0.0 oz/week     Comment: social   OB History    Gravida Para Term Preterm AB TAB SAB Ectopic Multiple Living   2 2 1 1  0 0 0 0 0 2     Review of Systems  Constitutional: Positive for chills. Negative for fever.  Respiratory: Positive for chest tightness. Negative for cough and shortness of breath.   Cardiovascular: Negative for chest pain.  Gastrointestinal: Positive for nausea and vomiting. Negative for diarrhea.  All other systems reviewed and are negative.     Allergies  Amoxicillin; Dilaudid; and Iohexol  Home Medications   Prior to Admission medications   Medication Sig Start Date End Date Taking? Authorizing Provider  aspirin-acetaminophen-caffeine (EXCEDRIN MIGRAINE) [phone] MG per tablet Take 1 tablet by mouth every 6 (six) hours as needed for headache.    Historical Provider, MD  azithromycin (ZITHROMAX Z-PAK) 250 MG tablet 2 po day one, then 1 daily x 4 days 05/17/14   Evelina Bucy, MD  guaiFENesin (ROBITUSSIN) 100 MG/5ML liquid Take 200 mg by mouth 3 (three) times daily as needed for cough.    Historical Provider, MD  ibuprofen (ADVIL,MOTRIN) 800 MG tablet Take 1 tablet (800 mg total) by mouth every 8 (eight) hours as needed for mild pain or moderate pain. 06/29/14   [name], PA-C  levonorgestrel (MIRENA) 20 MCG/24HR  IUD 1 each by Intrauterine route once. Implanted end of May 2014    Historical Provider, MD  methocarbamol (ROBAXIN) 500 MG tablet Take 1 tablet (500 mg total) by mouth every 6 (six) hours as needed for muscle spasms (and pain). 06/29/14   [name], PA-C  metoCLOPramide (REGLAN) 10 MG tablet Take 1 tablet (10 mg total) by mouth 3 (three) times daily as needed for nausea (headache / nausea). Patient not taking: Reported on 05/17/2014 04/25/14   Larene Pickett, PA-C  ondansetron (ZOFRAN ODT) 4 MG disintegrating tablet Take 1 tablet (4 mg total) by mouth every 8 (eight) hours as  needed for nausea or vomiting. Patient not taking: Reported on 03/03/2014 11/17/13   Junius Creamer, NP  predniSONE (DELTASONE) 20 MG tablet Take 1 tablet (20 mg total) by mouth 2 (two) times daily with a meal. 05/17/14   Dahlia Bailiff, PA-C  promethazine (PHENERGAN) 25 MG suppository Place 1 suppository (25 mg total) rectally every 6 (six) hours as needed for nausea or vomiting. Patient not taking: Reported on 04/15/2014 04/07/14   Francine Graven, DO  pseudoephedrine-acetaminophen (TYLENOL SINUS) 30-500 MG TABS Take 2 tablets by mouth every 4 (four) hours as needed (cold symptoms).    Historical Provider, MD  ranitidine (ZANTAC) 150 MG tablet Take 150 mg by mouth daily as needed for heartburn.    Historical Provider, MD   BP 106/60 mmHg  Pulse 90  Temp(Src) 99 F (37.2 C) (Oral)  Resp 18  Ht 5' (1.524 m)  Wt 225 lb (102.059 kg)  BMI 43.94 kg/m2  SpO2 100% Physical Exam  Constitutional: She is oriented to person, place, and time. She appears well-developed and well-nourished. No distress.  HENT:  Head: Normocephalic and atraumatic.  Mouth/Throat: Oropharynx is clear and moist.  Eyes: EOM are normal. Pupils are equal, round, and reactive to light.  Neck: Normal range of motion. Neck supple.  Cardiovascular: Normal rate and regular rhythm.  Exam reveals no friction rub.   No murmur heard. Pulmonary/Chest: Effort normal and breath sounds normal. No respiratory distress. She has no wheezes. She has no rales.  No subcutaneous emphysema  Abdominal: Soft. She exhibits no distension. There is no tenderness. There is no rebound.  Musculoskeletal: Normal range of motion. She exhibits no edema.  Neurological: She is alert and oriented to person, place, and time.  Skin: No rash noted. She is not diaphoretic.  Nursing note and vitals reviewed.   ED Course  Procedures (including critical care time) Labs Review Labs Reviewed - No data to display  Imaging Review No results found.   EKG  Interpretation None      MDM   Final diagnoses:  Non-intractable vomiting with nausea, vomiting of unspecified type    57F here with vomiting after drinking 3 40oz beers last night. Multiple episodes of vomiting last night and this morning. Well appearing, denies chest pain. Having mild chest tightness here. Speaking in full sentences, no respiratory distress. Belly benign. Chest normal, no subcutaneous emphysema. Lungs clear. Plan for Zofran, GI cocktail.  Feeling better. Stable for discharge.  Evelina Bucy, MD 08/11/14 [phone]

## 2014-08-11 NOTE — ED Notes (Signed)
Patient comes in c/o N/V after consuming 3 40oz cans of beer.  Patient states this is more than she normally drinks.  She woke up earlier this morning and began having N/V.  Pt states she has vomited approximately 10x.  Patient states she still feels "tipsy" and complains of shortness of breath.

## 2014-09-07 ENCOUNTER — Emergency Department (HOSPITAL_COMMUNITY)
Admission: EM | Admit: 2014-09-07 | Discharge: 2014-09-07 | Disposition: A | Discharge status: Home / Self Care | Payer: Self-pay | Attending: Emergency Medicine | Admitting: Emergency Medicine

## 2014-09-07 ENCOUNTER — Encounter (HOSPITAL_COMMUNITY): Payer: Self-pay | Admitting: *Deleted

## 2014-09-07 DIAGNOSIS — Z87440 Personal history of urinary (tract) infections: Secondary | ICD-10-CM | POA: Insufficient documentation

## 2014-09-07 DIAGNOSIS — R0981 Nasal congestion: Secondary | ICD-10-CM

## 2014-09-07 DIAGNOSIS — Z8719 Personal history of other diseases of the digestive system: Secondary | ICD-10-CM | POA: Insufficient documentation

## 2014-09-07 DIAGNOSIS — Z880 Allergy status to penicillin: Secondary | ICD-10-CM | POA: Insufficient documentation

## 2014-09-07 DIAGNOSIS — R1110 Vomiting, unspecified: Secondary | ICD-10-CM | POA: Insufficient documentation

## 2014-09-07 DIAGNOSIS — J069 Acute upper respiratory infection, unspecified: Secondary | ICD-10-CM | POA: Insufficient documentation

## 2014-09-07 MED ORDER — HYDROCOD POLST-CPM POLST ER 10-8 MG/5ML PO SUER: 5.0000 mL | Freq: Two times a day (BID) | ORAL | Status: DC | PRN

## 2014-09-07 MED ORDER — PSEUDOEPHEDRINE HCL ER 120 MG PO TB12: 120.0000 mg | ORAL_TABLET | Freq: Two times a day (BID) | ORAL | Status: DC

## 2014-09-07 NOTE — ED Provider Notes (Signed)
CSN: [id number]     Arrival date & time 09/07/14  1837 History  This chart was scribed for non-physician practitioner,Govanni Plemons M. Janit Bern, NP, working with Milton Ferguson, MD, by Helane Gunther ED Scribe. This patient was seen in room TR10C/TR10C and the patient's care was started at 7:18 PM    Chief Complaint  Patient presents with  . URI   The history is provided by the patient. No language interpreter was used.   HPI Comments: Emma Chambers is a 24 y.o. female who presents to the Emergency Department complaining of cough productive of yellow sputum onset 2 days ago. She also reports associated ear pain, sore throat, rhinorrhea, loss of appetite, post-tussive emesis, and some abdominal pain. She reports taking Nyquil and Tylenol cold and flu with mild relief. She has no history of chronic medical conditions and takes no medications regularly.  She denies urinary symptoms.  Past Medical History  Diagnosis Date  . UTI (lower urinary tract infection)   . Headache(784.0)     chronic  . Gallstones   . Hypertension     GHTN with last pg  . Pregnancy induced hypertension    Past Surgical History  Procedure Laterality Date  . Cholecystectomy N/A 07/27/2012    Procedure: LAPAROSCOPIC CHOLECYSTECTOMY WITH INTRAOPERATIVE CHOLANGIOGRAM;  Surgeon: Madilyn Hook, DO;  Location: [facility];  Service: General;  Laterality: N/A;  . Appendectomy    . Laparoscopic appendectomy N/A 12/09/2012    Procedure: APPENDECTOMY LAPAROSCOPIC;  Surgeon: Gwenyth Ober, MD;  Location: [facility] OR;  Service: General;  Laterality: N/A;   Family History  Problem Relation Age of Onset  . Asthma Mother   . Hypertension Mother   . Parkinsonism Mother   . Diabetes Maternal Grandmother   . Hypertension Maternal Grandmother   . Stroke Maternal Grandmother   . Anesthesia problems Neg Hx   . Malignant hyperthermia Neg Hx   . Pseudochol deficiency Neg Hx   . Hypotension Neg Hx   . Asthma Brother    History  Substance Use Topics   . Smoking status: Never Smoker   . Smokeless tobacco: Never Used  . Alcohol Use: 0.0 oz/week     Comment: social   OB History    Gravida Para Term Preterm AB TAB SAB Ectopic Multiple Living   2 2 1 1  0 0 0 0 0 2     Review of Systems  Constitutional: Positive for appetite change.  HENT: Positive for congestion, ear pain, rhinorrhea, sinus pressure and sore throat. Negative for trouble swallowing.   Respiratory: Positive for cough.   Gastrointestinal: Positive for vomiting (with cough). Negative for nausea. Abdominal pain: feels sore with coughing.  Genitourinary: Negative for dysuria, urgency, frequency and difficulty urinating.  all other systems negataive  Allergies  Amoxicillin; Dilaudid; and Iohexol  Home Medications   Prior to Admission medications   Medication Sig Start Date End Date Taking? Authorizing Provider  levonorgestrel (MIRENA) 20 MCG/24HR IUD 1 each by Intrauterine route once. Implanted end of May 2014   Yes Historical Provider, MD  Pseudoeph-CPM-DM-APAP (TYLENOL COLD PO) Take 2 capsules by mouth daily as needed (cold symptoms).   Yes Historical Provider, MD  Pseudoeph-Doxylamine-DM-APAP (NYQUIL PO) Take 15 mLs by mouth daily as needed (cough / cold).   Yes Historical Provider, MD  chlorpheniramine-HYDROcodone (TUSSIONEX PENNKINETIC ER) 10-8 MG/5ML SUER Take 5 mLs by mouth every 12 (twelve) hours as needed for cough. 09/07/14   Jahnyla Parrillo Bunnie Pion, NP  pseudoephedrine (SUDAFED 12 HOUR) 120  MG 12 hr tablet Take 1 tablet (120 mg total) by mouth 2 (two) times daily. 09/07/14   Teliyah Royal Bunnie Pion, NP   BP 116/66 mmHg  Pulse 90  Temp(Src) 99 F (37.2 C) (Oral)  Resp 20  Wt 225 lb (102.059 kg)  SpO2 100% Physical Exam  Constitutional: She is oriented to person, place, and time. No distress.  obese  HENT:  Head: Normocephalic and atraumatic.  Right Ear: Tympanic membrane normal.  Left Ear: Tympanic membrane normal.  Nose: Mucosal edema and rhinorrhea present. Right sinus  exhibits maxillary sinus tenderness. Left sinus exhibits maxillary sinus tenderness.  Mouth/Throat: Uvula is midline, oropharynx is clear and moist and mucous membranes are normal.  Nasal congestion with some edema of the nares Uvula is midline, no edema or erythema, no cervical lymphadenopathy  Tenderness to maxillary sinus areas bilaterally  Eyes: Conjunctivae and EOM are normal. Pupils are equal, round, and reactive to light.  Neck: Normal range of motion. Neck supple. No tracheal deviation present.  Cardiovascular: Normal rate, regular rhythm and normal heart sounds.   Pulmonary/Chest: Effort normal. No respiratory distress. She has no wheezes. She has no rales.  Abdominal: Soft. There is no tenderness.  Musculoskeletal: Normal range of motion.  Lymphadenopathy:    She has no cervical adenopathy.  Neurological: She is alert and oriented to person, place, and time.  Skin: Skin is warm and dry.  Psychiatric: She has a normal mood and affect. Her behavior is normal.  Nursing note and vitals reviewed.   ED Course  Procedures  DIAGNOSTIC STUDIES: Oxygen Saturation is 100% on RA, normal by my interpretation.    COORDINATION OF CARE: 7:22 PM - Discussed plans to order cough suppressant and decongestant. Pt advised of plan for treatment and pt agrees.   MDM  24 y.o. female with cough and congestion that started yesterday. Stable for d/c without fever and does not appear toxic. Will treat for URI and sinus congestion. Discussed with the patient and all questioned fully answered. She will return if any problems arise.   Final diagnoses:  URI (upper respiratory infection)  Sinus congestion   I personally performed the services described in this documentation, which was scribed in my presence. The recorded information has been reviewed and is accurate.    [address] [place], [place] 09/07/14 1956  Milton Ferguson, MD 09/09/14 [phone]

## 2014-09-07 NOTE — Discharge Instructions (Signed)
Cool Mist Vaporizers  Vaporizers may help relieve the symptoms of a cough and cold. They add moisture to the air, which helps mucus to become thinner and less sticky. This makes it easier to breathe and cough up secretions. Cool mist vaporizers do not cause serious burns like hot mist vaporizers, which may also be called steamers or humidifiers. Vaporizers have not been proven to help with colds. You should not use a vaporizer if you are allergic to mold.  HOME CARE INSTRUCTIONS  · Follow the package instructions for the vaporizer.  · Do not use anything other than distilled water in the vaporizer.  · Do not run the vaporizer all of the time. This can cause mold or bacteria to grow in the vaporizer.  · Clean the vaporizer after each time it is used.  · Clean and dry the vaporizer well before storing it.  · Stop using the vaporizer if worsening respiratory symptoms develop.  Document Released: 11/30/2003 Document Revised: 03/09/2013 Document Reviewed: 07/22/2012  ExitCare® Patient Information ©2015 [employer]. This information is not intended to replace advice given to you by your health care provider. Make sure you discuss any questions you have with your health care provider.  Cough, Adult   A cough is a reflex that helps clear your throat and airways. It can help heal the body or may be a reaction to an irritated airway. A cough may only last 2 or 3 weeks (acute) or may last more than 8 weeks (chronic).   CAUSES  Acute cough:  · Viral or bacterial infections.  Chronic cough:  · Infections.  · Allergies.  · Asthma.  · Post-nasal drip.  · Smoking.  · Heartburn or acid reflux.  · Some medicines.  · Chronic lung problems (COPD).  · Cancer.  SYMPTOMS   · Cough.  · Fever.  · Chest pain.  · Increased breathing rate.  · High-pitched whistling sound when breathing (wheezing).  · Colored mucus that you cough up (sputum).  TREATMENT   · A bacterial cough may be treated with antibiotic medicine.  · A viral cough must run  its course and will not respond to antibiotics.  · Your caregiver may recommend other treatments if you have a chronic cough.  HOME CARE INSTRUCTIONS   · Only take over-the-counter or prescription medicines for pain, discomfort, or fever as directed by your caregiver. Use cough suppressants only as directed by your caregiver.  · Use a cold steam vaporizer or humidifier in your bedroom or home to help loosen secretions.  · Sleep in a semi-upright position if your cough is worse at night.  · Rest as needed.  · Stop smoking if you smoke.  SEEK IMMEDIATE MEDICAL CARE IF:   · You have pus in your sputum.  · Your cough starts to worsen.  · You cannot control your cough with suppressants and are losing sleep.  · You begin coughing up blood.  · You have difficulty breathing.  · You develop pain which is getting worse or is uncontrolled with medicine.  · You have a fever.  MAKE SURE YOU:   · Understand these instructions.  · Will watch your condition.  · Will get help right away if you are not doing well or get worse.  Document Released: 08/31/2010 Document Revised: 05/27/2011 Document Reviewed: 08/31/2010  ExitCare® Patient Information ©2015 [employer]. This information is not intended to replace advice given to you by your health care provider. Make sure you discuss 

## 2014-09-07 NOTE — ED Notes (Signed)
Pt in c/o cough and congestion, body aches and chills for the last few days, no distress noted

## 2014-09-08 ENCOUNTER — Emergency Department (HOSPITAL_COMMUNITY)
Admission: EM | Admit: 2014-09-08 | Discharge: 2014-09-08 | Disposition: A | Discharge status: Home / Self Care | Payer: No Typology Code available for payment source | Attending: Emergency Medicine | Admitting: Emergency Medicine

## 2014-09-08 ENCOUNTER — Emergency Department (HOSPITAL_COMMUNITY): Payer: No Typology Code available for payment source

## 2014-09-08 ENCOUNTER — Encounter (HOSPITAL_COMMUNITY): Payer: Self-pay

## 2014-09-08 DIAGNOSIS — Y9389 Activity, other specified: Secondary | ICD-10-CM | POA: Insufficient documentation

## 2014-09-08 DIAGNOSIS — M546 Pain in thoracic spine: Secondary | ICD-10-CM

## 2014-09-08 DIAGNOSIS — S24109A Unspecified injury at unspecified level of thoracic spinal cord, initial encounter: Secondary | ICD-10-CM | POA: Diagnosis not present

## 2014-09-08 DIAGNOSIS — Z79899 Other long term (current) drug therapy: Secondary | ICD-10-CM | POA: Diagnosis not present

## 2014-09-08 DIAGNOSIS — Y92410 Unspecified street and highway as the place of occurrence of the external cause: Secondary | ICD-10-CM | POA: Insufficient documentation

## 2014-09-08 DIAGNOSIS — Y998 Other external cause status: Secondary | ICD-10-CM | POA: Diagnosis not present

## 2014-09-08 DIAGNOSIS — Z880 Allergy status to penicillin: Secondary | ICD-10-CM | POA: Diagnosis not present

## 2014-09-08 DIAGNOSIS — Z87440 Personal history of urinary (tract) infections: Secondary | ICD-10-CM | POA: Insufficient documentation

## 2014-09-08 DIAGNOSIS — S161XXA Strain of muscle, fascia and tendon at neck level, initial encounter: Secondary | ICD-10-CM | POA: Insufficient documentation

## 2014-09-08 DIAGNOSIS — G8929 Other chronic pain: Secondary | ICD-10-CM | POA: Diagnosis not present

## 2014-09-08 DIAGNOSIS — Z8719 Personal history of other diseases of the digestive system: Secondary | ICD-10-CM | POA: Diagnosis not present

## 2014-09-08 DIAGNOSIS — S199XXA Unspecified injury of neck, initial encounter: Secondary | ICD-10-CM | POA: Diagnosis present

## 2014-09-08 MED ORDER — IBUPROFEN 800 MG PO TABS: 800.0000 mg | ORAL_TABLET | Freq: Three times a day (TID) | ORAL | Status: DC

## 2014-09-08 MED ORDER — IBUPROFEN 800 MG PO TABS
800.0000 mg | ORAL_TABLET | Freq: Once | ORAL | Status: AC
  Administered 2014-09-08: 800 mg via ORAL
  Filled 2014-09-08: qty 1
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
## 2014-09-12 ENCOUNTER — Emergency Department (HOSPITAL_COMMUNITY)
Admission: EM | Admit: 2014-09-12 | Discharge: 2014-09-12 | Disposition: A | Discharge status: Home / Self Care | Payer: No Typology Code available for payment source | Attending: Emergency Medicine | Admitting: Emergency Medicine

## 2014-09-12 ENCOUNTER — Encounter (HOSPITAL_COMMUNITY): Payer: Self-pay | Admitting: Emergency Medicine

## 2014-09-12 DIAGNOSIS — S0990XA Unspecified injury of head, initial encounter: Secondary | ICD-10-CM | POA: Insufficient documentation

## 2014-09-12 DIAGNOSIS — Y92410 Unspecified street and highway as the place of occurrence of the external cause: Secondary | ICD-10-CM | POA: Insufficient documentation

## 2014-09-12 DIAGNOSIS — S24109A Unspecified injury at unspecified level of thoracic spinal cord, initial encounter: Secondary | ICD-10-CM | POA: Insufficient documentation

## 2014-09-12 DIAGNOSIS — M62830 Muscle spasm of back: Secondary | ICD-10-CM | POA: Diagnosis not present

## 2014-09-12 DIAGNOSIS — S199XXA Unspecified injury of neck, initial encounter: Secondary | ICD-10-CM | POA: Insufficient documentation

## 2014-09-12 DIAGNOSIS — Y9389 Activity, other specified: Secondary | ICD-10-CM | POA: Diagnosis not present

## 2014-09-12 DIAGNOSIS — R519 Headache, unspecified: Secondary | ICD-10-CM

## 2014-09-12 DIAGNOSIS — Z87440 Personal history of urinary (tract) infections: Secondary | ICD-10-CM | POA: Diagnosis not present

## 2014-09-12 DIAGNOSIS — R51 Headache: Secondary | ICD-10-CM

## 2014-09-12 DIAGNOSIS — Z8719 Personal history of other diseases of the digestive system: Secondary | ICD-10-CM | POA: Insufficient documentation

## 2014-09-12 DIAGNOSIS — Y998 Other external cause status: Secondary | ICD-10-CM | POA: Insufficient documentation

## 2014-09-12 DIAGNOSIS — Z880 Allergy status to penicillin: Secondary | ICD-10-CM | POA: Diagnosis not present

## 2014-09-12 DIAGNOSIS — M546 Pain in thoracic spine: Secondary | ICD-10-CM

## 2014-09-12 MED ORDER — NAPROXEN 500 MG PO TABS: 500.0000 mg | ORAL_TABLET | Freq: Two times a day (BID) | ORAL | Status: DC | PRN

## 2014-09-12 MED ORDER — CYCLOBENZAPRINE HCL 10 MG PO TABS
5.0000 mg | ORAL_TABLET | Freq: Once | ORAL | Status: AC
  Administered 2014-09-12: 5 mg via ORAL
  Filled 2014-09-12: qty 1

## 2014-09-12 MED ORDER — HYDROCODONE-ACETAMINOPHEN 5-325 MG PO TABS
1.0000 | ORAL_TABLET | Freq: Once | ORAL | Status: AC
  Administered 2014-09-12: 1 via ORAL
  Filled 2014-09-12: qty 1

## 2014-09-12 MED ORDER — CYCLOBENZAPRINE HCL 10 MG PO TABS: 10.0000 mg | ORAL_TABLET | Freq: Three times a day (TID) | ORAL | Status: DC | PRN

## 2014-09-12 MED ORDER — HYDROCODONE-ACETAMINOPHEN 5-325 MG PO TABS: 1.0000 | ORAL_TABLET | Freq: Four times a day (QID) | ORAL | Status: DC | PRN
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
## 2014-10-03 ENCOUNTER — Encounter (HOSPITAL_COMMUNITY): Payer: Self-pay | Admitting: *Deleted

## 2014-10-03 ENCOUNTER — Inpatient Hospital Stay (HOSPITAL_COMMUNITY)
Admission: AD | Admit: 2014-10-03 | Discharge: 2014-10-03 | Disposition: A | Discharge status: Home / Self Care | Payer: Self-pay | Source: Ambulatory Visit | Attending: Obstetrics & Gynecology | Admitting: Obstetrics & Gynecology

## 2014-10-03 DIAGNOSIS — Z30431 Encounter for routine checking of intrauterine contraceptive device: Secondary | ICD-10-CM | POA: Insufficient documentation

## 2014-10-03 DIAGNOSIS — N809 Endometriosis, unspecified: Secondary | ICD-10-CM

## 2014-10-03 DIAGNOSIS — N719 Inflammatory disease of uterus, unspecified: Secondary | ICD-10-CM | POA: Insufficient documentation

## 2014-10-03 LAB — WET PREP, GENITAL
Clue Cells Wet Prep HPF POC: NONE SEEN
Trich, Wet Prep: NONE SEEN
Yeast Wet Prep HPF POC: NONE SEEN

## 2014-10-03 LAB — URINALYSIS, ROUTINE W REFLEX MICROSCOPIC
Bilirubin Urine: NEGATIVE
Glucose, UA: NEGATIVE mg/dL
Ketones, ur: NEGATIVE mg/dL
Leukocytes, UA: NEGATIVE
Nitrite: NEGATIVE
Protein, ur: NEGATIVE mg/dL
Specific Gravity, Urine: >1.03 — ABNORMAL HIGH (ref 1.005–1.030)
Urobilinogen, UA: 1 mg/dL (ref 0.0–1.0)
pH: 5.5 (ref 5.0–8.0)

## 2014-10-03 LAB — URINE MICROSCOPIC-ADD ON

## 2014-10-03 LAB — POCT PREGNANCY, URINE: Preg Test, Ur: NEGATIVE

## 2014-10-03 MED ORDER — IBUPROFEN 800 MG PO TABS
800.0000 mg | ORAL_TABLET | Freq: Four times a day (QID) | ORAL | Status: DC | PRN
  Administered 2014-10-03: 800 mg via ORAL
  Filled 2014-10-03: qty 1

## 2014-10-03 MED ORDER — NORGESTIMATE-ETH ESTRADIOL 0.25-35 MG-MCG PO TABS: 1.0000 | ORAL_TABLET | Freq: Every day | ORAL | Status: DC

## 2014-10-03 MED ORDER — CEPHALEXIN 500 MG PO CAPS
500.0000 mg | ORAL_CAPSULE | Freq: Once | ORAL | Status: AC
  Administered 2014-10-03: 500 mg via ORAL
  Filled 2014-10-03: qty 1

## 2014-10-03 MED ORDER — CEPHALEXIN 500 MG PO CAPS: 500.0000 mg | ORAL_CAPSULE | Freq: Once | ORAL | Status: DC

## 2014-10-03 MED ORDER — IBUPROFEN 800 MG PO TABS: 800.0000 mg | ORAL_TABLET | Freq: Four times a day (QID) | ORAL | Status: DC | PRN

## 2014-10-03 NOTE — MAU Provider Note (Signed)
History   G2P2 not pregnant in with c/o abd pain, tenderness and bleeding for over a week. Was seen at [facility] and gc chla done and neg. Requesting IUD out and to be started on oral contraceptives. Discussed risk and benefits with pt and reviewed her med hx.  CSN: [id number]  Arrival date and time: 10/03/14 1413   None     Chief Complaint  Patient presents with  . Vaginal Bleeding  . Pelvic Pain   HPI  OB History    Gravida Para Term Preterm AB TAB SAB Ectopic Multiple Living   2 2 1 1  0 0 0 0 0 2      Past Medical History  Diagnosis Date  . UTI (lower urinary tract infection)   . Headache(784.0)     chronic  . Gallstones   . Hypertension     GHTN with last pg  . Pregnancy induced hypertension     Past Surgical History  Procedure Laterality Date  . Cholecystectomy N/A 07/27/2012    Procedure: LAPAROSCOPIC CHOLECYSTECTOMY WITH INTRAOPERATIVE CHOLANGIOGRAM;  Surgeon: Madilyn Hook, DO;  Location: [facility];  Service: General;  Laterality: N/A;  . Appendectomy    . Laparoscopic appendectomy N/A 12/09/2012    Procedure: APPENDECTOMY LAPAROSCOPIC;  Surgeon: Gwenyth Ober, MD;  Location: [facility] OR;  Service: General;  Laterality: N/A;    Family History  Problem Relation Age of Onset  . Asthma Mother   . Hypertension Mother   . Parkinsonism Mother   . Diabetes Maternal Grandmother   . Hypertension Maternal Grandmother   . Stroke Maternal Grandmother   . Anesthesia problems Neg Hx   . Malignant hyperthermia Neg Hx   . Pseudochol deficiency Neg Hx   . Hypotension Neg Hx   . Asthma Brother     History  Substance Use Topics  . Smoking status: Never Smoker   . Smokeless tobacco: Never Used  . Alcohol Use: 0.0 oz/week     Comment: social    Allergies:  Allergies  Allergen Reactions  . Amoxicillin Nausea And Vomiting and Other (See Comments)    Tics & twitches  . Dilaudid [Hydromorphone Hcl] Itching  . Iohexol Hives and Itching    Prescriptions prior to admission   Medication Sig Dispense Refill Last Dose  . chlorpheniramine-HYDROcodone (TUSSIONEX PENNKINETIC ER) 10-8 MG/5ML SUER Take 5 mLs by mouth every 12 (twelve) hours as needed for cough. 115 mL 0   . cyclobenzaprine (FLEXERIL) 10 MG tablet Take 1 tablet (10 mg total) by mouth 3 (three) times daily as needed for muscle spasms. 15 tablet 0   . HYDROcodone-acetaminophen (NORCO) 5-325 MG per tablet Take 1 tablet by mouth every 6 (six) hours as needed for severe pain. 6 tablet 0   . ibuprofen (ADVIL,MOTRIN) 800 MG tablet Take 1 tablet (800 mg total) by mouth 3 (three) times daily. 21 tablet 0   . levonorgestrel (MIRENA) 20 MCG/24HR IUD 1 each by Intrauterine route once. Implanted end of May 2014   ongoing at unknown time  . naproxen (NAPROSYN) 500 MG tablet Take 1 tablet (500 mg total) by mouth 2 (two) times daily as needed for mild pain, moderate pain or headache (TAKE WITH MEALS.). 20 tablet 0   . Pseudoeph-CPM-DM-APAP (TYLENOL COLD PO) Take 2 capsules by mouth daily as needed (cold symptoms).   Past Week at Unknown time  . Pseudoeph-Doxylamine-DM-APAP (NYQUIL PO) Take 15 mLs by mouth daily as needed (cough / cold).   Past Week at Unknown time  .  pseudoephedrine (SUDAFED 12 HOUR) 120 MG 12 hr tablet Take 1 tablet (120 mg total) by mouth 2 (two) times daily. 20 tablet 0     Review of Systems  Constitutional: Negative.   HENT: Negative.   Eyes: Negative.   Respiratory: Negative.   Cardiovascular: Negative.   Gastrointestinal: Positive for abdominal pain.  Genitourinary: Negative.   Musculoskeletal: Negative.   Skin: Negative.   Neurological: Negative.   Endo/Heme/Allergies: Negative.   Psychiatric/Behavioral: Negative.    Physical Exam   Blood pressure 118/77, pulse 102, temperature 98.8 F (37.1 C), resp. rate 20, height 5\' 2"  (1.575 m), weight 231 lb 3.2 oz (104.872 kg).  Physical Exam  Constitutional: She is oriented to person, place, and time. She appears well-developed and  well-nourished.  HENT:  Head: Normocephalic.  Eyes: Pupils are equal, round, and reactive to light.  Neck: Normal range of motion.  Cardiovascular: Normal rate, regular rhythm, normal heart sounds and intact distal pulses.   Respiratory: Effort normal and breath sounds normal.  GI: Soft. There is tenderness.  CMT and uterine tenderness with exam.  Musculoskeletal: Normal range of motion.  Neurological: She is alert and oriented to person, place, and time. She has normal reflexes.  Skin: Skin is warm and dry.  Psychiatric: She has a normal mood and affect. Her behavior is normal. Judgment and thought content normal.    MAU Course  Procedures  MDM Endometritis   Assessment and Plan  Sterile spec exam done sm amt bright vag bleeding, cervical motion tenderness noted and uterine tenderness with exam. IUD noted to be trapped in cervix and easily removed with ring tip forcep. Will start on keflex and OCPs. To follow up at [facility] 6 wks.  Koren Shiver DARLENE 10/03/2014, 3:19 PM

## 2014-10-03 NOTE — Discharge Instructions (Signed)
Oral Contraception Information Oral contraceptive pills (OCPs) are medicines taken to prevent pregnancy. OCPs work by preventing the ovaries from releasing eggs. The hormones in OCPs also cause the cervical mucus to thicken, preventing the sperm from entering the uterus. The hormones also cause the uterine lining to become thin, not allowing a fertilized egg to attach to the inside of the uterus. OCPs are highly effective when taken exactly as prescribed. However, OCPs do not prevent sexually transmitted diseases (STDs). Safe sex practices, such as using condoms along with the pill, can help prevent STDs.  Before taking the pill, you may have a physical exam and Pap test. Your health care provider may order blood tests. The health care provider will make sure you are a good candidate for oral contraception. Discuss with your health care provider the possible side effects of the OCP you may be prescribed. When starting an OCP, it can take 2 to 3 months for the body to adjust to the changes in hormone levels in your body.  TYPES OF ORAL CONTRACEPTION  The combination pill--This pill contains estrogen and progestin (synthetic progesterone) hormones. The combination pill comes in 21-day, 28-day, or 91-day packs. Some types of combination pills are meant to be taken continuously (365-day pills). With 21-day packs, you do not take pills for 7 days after the last pill. With 28-day packs, the pill is taken every day. The last 7 pills are without hormones. Certain types of pills have more than 21 hormone-containing pills. With 91-day packs, the first 84 pills contain both hormones, and the last 7 pills contain no hormones or contain estrogen only.  The minipill--This pill contains the progesterone hormone only. The pill is taken every day continuously. It is very important to take the pill at the same time each day. The minipill comes in packs of 28 pills. All 28 pills contain the hormone.  ADVANTAGES OF ORAL  CONTRACEPTIVE PILLS  Decreases premenstrual symptoms.   Treats menstrual period cramps.   Regulates the menstrual cycle.   Decreases a heavy menstrual flow.   May treatacne, depending on the type of pill.   Treats abnormal uterine bleeding.   Treats polycystic ovarian syndrome.   Treats endometriosis.   Can be used as emergency contraception.  THINGS THAT CAN MAKE ORAL CONTRACEPTIVE PILLS LESS EFFECTIVE OCPs can be less effective if:   You forget to take the pill at the same time every day.   You have a stomach or intestinal disease that lessens the absorption of the pill.   You take OCPs with other medicines that make OCPs less effective, such as antibiotics, certain HIV medicines, and some seizure medicines.   You take expired OCPs.   You forget to restart the pill on day 7, when using the packs of 21 pills.  RISKS ASSOCIATED WITH ORAL CONTRACEPTIVE PILLS  Oral contraceptive pills can sometimes cause side effects, such as:  Headache.  Nausea.  Breast tenderness.  Irregular bleeding or spotting. Combination pills are also associated with a small increased risk of:  Blood clots.  Heart attack.  Stroke. Document Released: 05/25/2002 Document Revised: 12/23/2012 Document Reviewed: 08/23/2012 [facility] Patient Information 2015 [place], [place]. This information is not intended to replace advice given to you by your health care provider. Make sure you discuss any questions you have with your health care provider. Endometritis Endometritis is an irritation, soreness, and swelling (inflammation) of the lining of the uterus (endometrium).  CAUSES   Bacterial infections.  Sexually transmitted infections (STIs).  Having a miscarriage or childbirth, especially after a long labor or cesarean delivery.  Certain gynecological procedures (such as dilation and curettage, hysteroscopy, or contraceptive insertion). SIGNS AND SYMPTOMS   Fever.  Lower  abdominal or pelvic pain.  Abnormal vaginal discharge or bleeding.  Abdominal bloating (distention) or swelling.  General discomfort or ill feeling.  Discomfort with bowel movements. DIAGNOSIS  A physical and pelvic exam are performed. Other tests may include:  Cultures from the cervix.  Blood tests.  Examining a tissue sample of the uterine lining (endometrial biopsy).  Examining discharge under a microscope (wet prep).  Laparoscopy. TREATMENT  Antibiotic medicines are usually given. Other treatments may include:  Fluids through an IV tube inserted in your vein.  Rest. HOME CARE INSTRUCTIONS   Take over-the-counter or prescription medicines for pain, discomfort, or fever as directed by your health care provider.  Take your antibiotics as directed. Finish them even if you start to feel better.  Resume your normal diet and activities as directed or as tolerated.  Do not douche or have sexual intercourse until your health care provider says it is okay.  Do not have sexual intercourse until your partner has been treated if your endometritis is caused by an STI. SEEK IMMEDIATE MEDICAL CARE IF:   You have swelling or increasing pain in the abdomen.  You have a fever.  You have bad smelling vaginal discharge, or you have an increased amount of discharge.  You have abnormal vaginal bleeding.  Your medicine is not helping with the pain.  You experience any problems that may be related to the medicine you are taking.  You have nausea and vomiting, or you cannot keep foods down.  You have pain with bowel movements. MAKE SURE YOU:   Understand these instructions.  Will watch your condition.  Will get help right away if you are not doing well or get worse. Document Released: 02/26/2001 Document Revised: 11/04/2012 Document Reviewed: 10/01/2012 [facility] Patient Information 2015 [place], [place]. This information is not intended to replace advice given to you by your  health care provider. Make sure you discuss any questions you have with your health care provider.

## 2014-10-03 NOTE — MAU Note (Signed)
Pt complaining of pelvic pain, went to health dept. STDs neg, has IUD feels pressure to push. No intercourse. Denies N/V. Has scant bleeding for 2 weeks.

## 2014-10-07 ENCOUNTER — Emergency Department (HOSPITAL_COMMUNITY)
Admission: EM | Admit: 2014-10-07 | Discharge: 2014-10-07 | Disposition: A | Discharge status: Home / Self Care | Payer: Medicaid Other | Attending: Emergency Medicine | Admitting: Emergency Medicine

## 2014-10-07 ENCOUNTER — Emergency Department (HOSPITAL_COMMUNITY): Payer: Medicaid Other

## 2014-10-07 ENCOUNTER — Encounter (HOSPITAL_COMMUNITY): Payer: Self-pay | Admitting: Emergency Medicine

## 2014-10-07 DIAGNOSIS — Z87440 Personal history of urinary (tract) infections: Secondary | ICD-10-CM | POA: Insufficient documentation

## 2014-10-07 DIAGNOSIS — R102 Pelvic and perineal pain unspecified side: Secondary | ICD-10-CM

## 2014-10-07 DIAGNOSIS — I10 Essential (primary) hypertension: Secondary | ICD-10-CM | POA: Insufficient documentation

## 2014-10-07 DIAGNOSIS — Z8719 Personal history of other diseases of the digestive system: Secondary | ICD-10-CM | POA: Insufficient documentation

## 2014-10-07 DIAGNOSIS — Z3202 Encounter for pregnancy test, result negative: Secondary | ICD-10-CM | POA: Insufficient documentation

## 2014-10-07 DIAGNOSIS — R109 Unspecified abdominal pain: Secondary | ICD-10-CM

## 2014-10-07 DIAGNOSIS — Z880 Allergy status to penicillin: Secondary | ICD-10-CM | POA: Insufficient documentation

## 2014-10-07 LAB — COMPREHENSIVE METABOLIC PANEL
ALT: 11 U/L — ABNORMAL LOW (ref 14–54)
AST: 17 U/L (ref 15–41)
Albumin: 3.5 g/dL (ref 3.5–5.0)
Alkaline Phosphatase: 69 U/L (ref 38–126)
Anion gap: 7 (ref 5–15)
BUN: 6 mg/dL (ref 6–20)
CO2: 25 mmol/L (ref 22–32)
Calcium: 9.2 mg/dL (ref 8.9–10.3)
Chloride: 107 mmol/L (ref 101–111)
Creatinine, Ser: 0.7 mg/dL (ref 0.44–1.00)
GFR calc Af Amer: >60 mL/min (ref >60)
GFR calc non Af Amer: >60 mL/min (ref >60)
Glucose, Bld: 91 mg/dL (ref 65–99)
Potassium: 3.6 mmol/L (ref 3.5–5.1)
Sodium: 139 mmol/L (ref 135–145)
Total Bilirubin: 0.3 mg/dL (ref 0.3–1.2)
Total Protein: 6.7 g/dL (ref 6.5–8.1)

## 2014-10-07 LAB — URINE MICROSCOPIC-ADD ON

## 2014-10-07 LAB — CBC WITH DIFFERENTIAL/PLATELET
Basophils Absolute: 0 10*3/uL (ref 0.0–0.1)
Basophils Relative: 0 % (ref 0–1)
Eosinophils Absolute: 0.5 10*3/uL (ref 0.0–0.7)
Eosinophils Relative: 6 % — ABNORMAL HIGH (ref 0–5)
HCT: 35.1 % — ABNORMAL LOW (ref 36.0–46.0)
Hemoglobin: 11.5 g/dL — ABNORMAL LOW (ref 12.0–15.0)
Lymphocytes Relative: 31 % (ref 12–46)
Lymphs Abs: 2.7 10*3/uL (ref 0.7–4.0)
MCH: 26.9 pg (ref 26.0–34.0)
MCHC: 32.8 g/dL (ref 30.0–36.0)
MCV: 82 fL (ref 78.0–100.0)
Monocytes Absolute: 0.4 10*3/uL (ref 0.1–1.0)
Monocytes Relative: 4 % (ref 3–12)
Neutro Abs: 5.2 10*3/uL (ref 1.7–7.7)
Neutrophils Relative %: 59 % (ref 43–77)
Platelets: 310 10*3/uL (ref 150–400)
RBC: 4.28 MIL/uL (ref 3.87–5.11)
RDW: 14.2 % (ref 11.5–15.5)
WBC: 8.9 10*3/uL (ref 4.0–10.5)

## 2014-10-07 LAB — URINALYSIS, ROUTINE W REFLEX MICROSCOPIC
Bilirubin Urine: NEGATIVE
Glucose, UA: NEGATIVE mg/dL
Ketones, ur: 15 mg/dL — AB
Nitrite: NEGATIVE
Protein, ur: 100 mg/dL — AB
Specific Gravity, Urine: 1.027 (ref 1.005–1.030)
Urobilinogen, UA: 0.2 mg/dL (ref 0.0–1.0)
pH: 5.5 (ref 5.0–8.0)

## 2014-10-07 LAB — POC URINE PREG, ED: Preg Test, Ur: NEGATIVE

## 2014-10-07 MED ORDER — MORPHINE SULFATE 4 MG/ML IJ SOLN
4.0000 mg | Freq: Once | INTRAMUSCULAR | Status: AC
  Administered 2014-10-07: 4 mg via INTRAVENOUS
  Filled 2014-10-07: qty 1

## 2014-10-07 MED ORDER — HYDROCODONE-ACETAMINOPHEN 5-325 MG PO TABS: 1.0000 | ORAL_TABLET | Freq: Four times a day (QID) | ORAL | Status: DC | PRN

## 2014-10-07 MED ORDER — ONDANSETRON HCL 4 MG/2ML IJ SOLN
4.0000 mg | Freq: Once | INTRAMUSCULAR | Status: AC
  Administered 2014-10-07: 4 mg via INTRAVENOUS
  Filled 2014-10-07: qty 2

## 2014-10-07 MED ORDER — CEFTRIAXONE SODIUM 250 MG IJ SOLR
250.0000 mg | Freq: Once | INTRAMUSCULAR | Status: AC
  Administered 2014-10-07: 250 mg via INTRAMUSCULAR
  Filled 2014-10-07: qty 250

## 2014-10-07 MED ORDER — IOHEXOL 300 MG/ML  SOLN
100.0000 mL | Freq: Once | INTRAMUSCULAR | Status: AC | PRN
  Administered 2014-10-07: 100 mL via INTRAVENOUS

## 2014-10-07 MED ORDER — HYDROCORTISONE NA SUCCINATE PF 100 MG IJ SOLR
200.0000 mg | Freq: Once | INTRAMUSCULAR | Status: AC
  Administered 2014-10-07: 200 mg via INTRAVENOUS
  Filled 2014-10-07: qty 4

## 2014-10-07 MED ORDER — DIPHENHYDRAMINE HCL 50 MG/ML IJ SOLN
50.0000 mg | Freq: Once | INTRAMUSCULAR | Status: AC
  Administered 2014-10-07: 50 mg via INTRAVENOUS
  Filled 2014-10-07: qty 1

## 2014-10-07 MED ORDER — DOXYCYCLINE HYCLATE 100 MG PO CAPS: 100.0000 mg | ORAL_CAPSULE | Freq: Two times a day (BID) | ORAL | Status: DC

## 2014-10-07 MED ORDER — HYDROMORPHONE HCL 1 MG/ML IJ SOLN
1.0000 mg | Freq: Once | INTRAMUSCULAR | Status: DC
  Filled 2014-10-07: qty 1

## 2014-10-07 MED ORDER — KETOROLAC TROMETHAMINE 30 MG/ML IJ SOLN
30.0000 mg | Freq: Once | INTRAMUSCULAR | Status: AC
  Administered 2014-10-07: 30 mg via INTRAVENOUS
  Filled 2014-10-07: qty 1

## 2014-10-07 MED ORDER — LIDOCAINE HCL (PF) 1 % IJ SOLN
5.0000 mL | Freq: Once | INTRAMUSCULAR | Status: AC
  Administered 2014-10-07: 5 mL
  Filled 2014-10-07: qty 5

## 2014-10-07 NOTE — Discharge Instructions (Signed)
Return here as needed.  Follow-up with your primary care doctor.  Your testing here today did not show any significant abnormality other than a ovarian cyst

## 2014-10-07 NOTE — ED Provider Notes (Signed)
CSN: [id number]     Arrival date & time 10/07/14  0815 History   First MD Initiated Contact with Patient 10/07/14 [phone]     Chief Complaint  Patient presents with  . Abdominal Pain     (Consider location/radiation/quality/duration/timing/severity/associated sxs/prior Treatment) HPI Patient presents to the emergency department with a lateral lower abdominal pain that started last week.  The patient states that she has had lower abdominal pain, but no vaginal bleeding or discharge.  Patient states she has an IUD.  Patient states that she is taken nothing for her abdominal pain.  The patient denies chest pain, shortness of breath, weakness, dizziness, headache, blurred vision, back pain, neck pain, fever, cough, incontinence, diarrhea, nausea, vomiting, or syncope.  The patient states that she was recently seen for this.  [facility] she has had a recent pelvic exam with negative STD testing.  The patient states that she has had cysts on her ovaries previously Past Medical History  Diagnosis Date  . UTI (lower urinary tract infection)   . Headache(784.0)     chronic  . Gallstones   . Hypertension     GHTN with last pg  . Pregnancy induced hypertension    Past Surgical History  Procedure Laterality Date  . Cholecystectomy N/A 07/27/2012    Procedure: LAPAROSCOPIC CHOLECYSTECTOMY WITH INTRAOPERATIVE CHOLANGIOGRAM;  Surgeon: Madilyn Hook, DO;  Location: [facility];  Service: General;  Laterality: N/A;  . Appendectomy    . Laparoscopic appendectomy N/A 12/09/2012    Procedure: APPENDECTOMY LAPAROSCOPIC;  Surgeon: Gwenyth Ober, MD;  Location: [facility] OR;  Service: General;  Laterality: N/A;   Family History  Problem Relation Age of Onset  . Asthma Mother   . Hypertension Mother   . Parkinsonism Mother   . Diabetes Maternal Grandmother   . Hypertension Maternal Grandmother   . Stroke Maternal Grandmother   . Anesthesia problems Neg Hx   . Malignant hyperthermia Neg Hx   . Pseudochol  deficiency Neg Hx   . Hypotension Neg Hx   . Asthma Brother    History  Substance Use Topics  . Smoking status: Never Smoker   . Smokeless tobacco: Never Used  . Alcohol Use: 0.0 oz/week     Comment: social   OB History    Gravida Para Term Preterm AB TAB SAB Ectopic Multiple Living   2 2 1 1  0 0 0 0 0 2     Review of Systems  All other systems negative except as documented in the HPI. All pertinent positives and negatives as reviewed in the HPI.  Allergies  Amoxicillin; Dilaudid; and Iohexol  Home Medications   Prior to Admission medications   Medication Sig Start Date End Date Taking? Authorizing Provider  cephALEXin (KEFLEX) 500 MG capsule Take 1 capsule (500 mg total) by mouth once. Patient not taking: Reported on 10/07/2014 10/03/14   Keitha Butte, CNM  ibuprofen (ADVIL,MOTRIN) 800 MG tablet Take 1 tablet (800 mg total) by mouth every 6 (six) hours as needed (pain). Patient not taking: Reported on 10/07/2014 10/03/14   Keitha Butte, CNM  norgestimate-ethinyl estradiol (SPRINTEC 28) 0.25-35 MG-MCG tablet Take 1 tablet by mouth daily. Patient not taking: Reported on 10/07/2014 10/03/14   Keitha Butte, CNM   BP 106/54 mmHg  Pulse 74  Temp(Src) 98.4 F (36.9 C) (Oral)  Resp 20  Ht 5' (1.524 m)  Wt 231 lb (104.781 kg)  BMI 45.11 kg/m2  SpO2 99%  LMP  Physical Exam  Constitutional: She is oriented to person, place, and time. She appears well-developed and well-nourished. No distress.  HENT:  Head: Normocephalic and atraumatic.  Mouth/Throat: Oropharynx is clear and moist.  Eyes: Pupils are equal, round, and reactive to light.  Neck: Normal range of motion.  Cardiovascular: Normal rate, regular rhythm and normal heart sounds.   Pulmonary/Chest: Effort normal and breath sounds normal. No respiratory distress.  Abdominal: Bowel sounds are normal. She exhibits no distension. There is tenderness. There is no rebound and no guarding.  Genitourinary:  Patient had a  recent pelvic exam or days ago and we will not repeat that at this time  Neurological: She is alert and oriented to person, place, and time. She exhibits normal muscle tone. Coordination normal.  Skin: Skin is warm and dry. No rash noted. No erythema.  Psychiatric: She has a normal mood and affect. Her behavior is normal.  Nursing note and vitals reviewed.   ED Course  Procedures (including critical care time) Labs Review Labs Reviewed  CBC WITH DIFFERENTIAL/PLATELET - Abnormal; Notable for the following:    Hemoglobin 11.5 (*)    HCT 35.1 (*)    Eosinophils Relative 6 (*)    All other components within normal limits  URINALYSIS, ROUTINE W REFLEX MICROSCOPIC (NOT AT [facility]) - Abnormal; Notable for the following:    Color, Urine AMBER (*)    APPearance CLOUDY (*)    Hgb urine dipstick LARGE (*)    Ketones, ur 15 (*)    Protein, ur 100 (*)    Leukocytes, UA SMALL (*)    All other components within normal limits  COMPREHENSIVE METABOLIC PANEL - Abnormal; Notable for the following:    ALT 11 (*)    All other components within normal limits  URINE MICROSCOPIC-ADD ON - Abnormal; Notable for the following:    Squamous Epithelial / LPF MANY (*)    Bacteria, UA MANY (*)    All other components within normal limits  POC URINE PREG, ED    Imaging Review Ct Abdomen Pelvis W Contrast  10/07/2014   CLINICAL DATA:  Lower abdominal pain.  EXAM: CT ABDOMEN AND PELVIS WITH CONTRAST  TECHNIQUE: Multidetector CT imaging of the abdomen and pelvis was performed using the standard protocol following bolus administration of intravenous contrast.  CONTRAST:  163mL OMNIPAQUE IOHEXOL 300 MG/ML  SOLN  COMPARISON:  CT scan of January 18, 2014.  FINDINGS: Visualized lung bases appear normal. No significant osseous abnormality is noted.  Status post cholecystectomy and appendectomy. The liver, spleen and pancreas appear normal. Adrenal glands and kidneys appear normal. No hydronephrosis or renal obstruction is  noted. There is no evidence of bowel obstruction. No abnormal fluid collection is noted. Uterus and left ovary appear normal. Right ovarian cystic abnormality is significantly smaller currently, now only measuring 2.9 cm. Urinary bladder appears normal. No significant adenopathy is noted.  IMPRESSION: Right ovarian cystic abnormality noted on prior exam is significantly smaller currently. No other significant abnormality seen in the abdomen or pelvis.   Electronically Signed   By: Marijo Conception, M.D.   On: 10/07/2014 13:15     She will be treated for possible PID.  She does have an ovarian cyst and this could be causing her pain and advised to follow-up with her GYN doctor, told to return here as needed  Dalia Heading, PA-C 10/07/14 [place], MD 10/09/14 [phone]

## 2014-10-07 NOTE — ED Notes (Signed)
Pt. Stated, I've been having lower abdominal pain since yesterday it might be my cyst on my ovaries.  I also am having trouble going to bathroom.

## 2014-10-07 NOTE — ED Notes (Signed)
Pt reported to RN that she could not drink the Barium and would prefer to have the IV contrast.  When questioned about allergy, pt reports "I just itched a lot but I've had it again and they just gave me Benadryl beforehand and I did fine."  Pt denies hives, rash or respiratory difficulty.  Lawyer PA made aware and further v/o received.

## 2014-10-07 NOTE — ED Notes (Signed)
Pt reports diffuse lower abdominal pain that began last night with painful bowel movements.  Pt last BM was last night.  Pt denies any blood in stool or dysuria.  Pt is unsure of LMP pt has IUD.

## 2014-10-18 ENCOUNTER — Encounter (HOSPITAL_COMMUNITY): Payer: Self-pay | Admitting: Emergency Medicine

## 2014-10-18 ENCOUNTER — Emergency Department (HOSPITAL_COMMUNITY)
Admission: EM | Admit: 2014-10-18 | Discharge: 2014-10-18 | Discharge status: Against Medical Advice | Payer: Medicaid Other | Attending: Emergency Medicine | Admitting: Emergency Medicine

## 2014-10-18 ENCOUNTER — Emergency Department (HOSPITAL_COMMUNITY): Payer: Medicaid Other

## 2014-10-18 DIAGNOSIS — G8929 Other chronic pain: Secondary | ICD-10-CM | POA: Insufficient documentation

## 2014-10-18 DIAGNOSIS — R1013 Epigastric pain: Secondary | ICD-10-CM | POA: Insufficient documentation

## 2014-10-18 DIAGNOSIS — Z87440 Personal history of urinary (tract) infections: Secondary | ICD-10-CM | POA: Insufficient documentation

## 2014-10-18 DIAGNOSIS — R110 Nausea: Secondary | ICD-10-CM | POA: Insufficient documentation

## 2014-10-18 DIAGNOSIS — R079 Chest pain, unspecified: Secondary | ICD-10-CM | POA: Insufficient documentation

## 2014-10-18 DIAGNOSIS — Z880 Allergy status to penicillin: Secondary | ICD-10-CM | POA: Insufficient documentation

## 2014-10-18 DIAGNOSIS — Z8719 Personal history of other diseases of the digestive system: Secondary | ICD-10-CM | POA: Insufficient documentation

## 2014-10-18 DIAGNOSIS — R0602 Shortness of breath: Secondary | ICD-10-CM | POA: Insufficient documentation

## 2014-10-18 LAB — CBC
HCT: 34.9 % — ABNORMAL LOW (ref 36.0–46.0)
Hemoglobin: 11.6 g/dL — ABNORMAL LOW (ref 12.0–15.0)
MCH: 27 pg (ref 26.0–34.0)
MCHC: 33.2 g/dL (ref 30.0–36.0)
MCV: 81.4 fL (ref 78.0–100.0)
Platelets: 350 10*3/uL (ref 150–400)
RBC: 4.29 MIL/uL (ref 3.87–5.11)
RDW: 14.4 % (ref 11.5–15.5)
WBC: 11.3 10*3/uL — ABNORMAL HIGH (ref 4.0–10.5)

## 2014-10-18 LAB — BASIC METABOLIC PANEL
Anion gap: 6 (ref 5–15)
BUN: 10 mg/dL (ref 6–20)
CO2: 23 mmol/L (ref 22–32)
Calcium: 8.8 mg/dL — ABNORMAL LOW (ref 8.9–10.3)
Chloride: 105 mmol/L (ref 101–111)
Creatinine, Ser: 0.72 mg/dL (ref 0.44–1.00)
GFR calc Af Amer: >60 mL/min (ref >60)
GFR calc non Af Amer: >60 mL/min (ref >60)
Glucose, Bld: 111 mg/dL — ABNORMAL HIGH (ref 65–99)
Potassium: 3.6 mmol/L (ref 3.5–5.1)
Sodium: 134 mmol/L — ABNORMAL LOW (ref 135–145)

## 2014-10-18 LAB — I-STAT TROPONIN, ED: Troponin i, poc: 0 ng/mL (ref 0.00–0.08)

## 2014-10-18 MED ORDER — GI COCKTAIL ~~LOC~~
30.0000 mL | Freq: Once | ORAL | Status: AC
  Administered 2014-10-18: 30 mL via ORAL
  Filled 2014-10-18: qty 30

## 2014-10-18 NOTE — ED Notes (Signed)
Pt requested to speak with RN. This RN went in pt room and pt states "I am ready to go and would like to leave." This RN asked pt to wait a few minutes so this RN could speak with MD and would return soon. This RN explained that pt c/o 10/10 chest pain and the risks/benefits of leaving without MD following up with pt. When MD went to speak with pt, pt was not found in room - confirmed pt walked out per RN waiting out front with another pt.

## 2014-10-18 NOTE — ED Provider Notes (Signed)
CSN: [id number]     Arrival date & time 10/18/14  0030 History  This chart was scribed for Quintella Reichert, MD by Randa Evens, ED Scribe. This patient was seen in room A05C/A05C and the patient's care was started at 2:01 AM.     Chief Complaint  Patient presents with  . Chest Pain   Patient is a 24 y.o. female presenting with chest pain. The history is provided by the patient. No language interpreter was used.  Chest Pain Pain location:  Substernal area Pain quality: tightness   Pain radiates to:  Does not radiate Pain radiates to the back: no   Pain severity:  Mild Onset quality:  Gradual Duration:  2 hours Timing:  Constant Progression:  Unchanged Chronicity:  New Relieved by:  Nothing Ineffective treatments:  Aspirin Associated symptoms: abdominal pain, nausea and shortness of breath   Associated symptoms: no back pain, no cough, no fever and not vomiting   Risk factors: no birth control, not pregnant and no prior DVT/PE    HPI Comments: JERALINE MARCINEK is a 24 y.o. female who presents to the Emergency Department complaining of constant chest pain onset 2 hours PTA after taking 8 81mg  Asprin. Pt describes the pain as tight and squeezing. She states that the pain is non radiating. She reports slight nausea and abdominal pain and slight SOB. Pt states she took the Asprin because she had a HA. Denies fever, nausea or other related symptoms. Denies HX of thrombus. Denies Hx recent surgeries. Reports ETOH use tonight.   Past Medical History  Diagnosis Date  . UTI (lower urinary tract infection)   . Headache(784.0)     chronic  . Gallstones   . Hypertension     GHTN with last pg  . Pregnancy induced hypertension    Past Surgical History  Procedure Laterality Date  . Cholecystectomy N/A 07/27/2012    Procedure: LAPAROSCOPIC CHOLECYSTECTOMY WITH INTRAOPERATIVE CHOLANGIOGRAM;  Surgeon: Madilyn Hook, DO;  Location: [facility];  Service: General;  Laterality: N/A;  . Appendectomy     . Laparoscopic appendectomy N/A 12/09/2012    Procedure: APPENDECTOMY LAPAROSCOPIC;  Surgeon: Gwenyth Ober, MD;  Location: [facility];  Service: General;  Laterality: N/A;   Family History  Problem Relation Age of Onset  . Asthma Mother   . Hypertension Mother   . Parkinsonism Mother   . Diabetes Maternal Grandmother   . Hypertension Maternal Grandmother   . Stroke Maternal Grandmother   . Anesthesia problems Neg Hx   . Malignant hyperthermia Neg Hx   . Pseudochol deficiency Neg Hx   . Hypotension Neg Hx   . Asthma Brother    History  Substance Use Topics  . Smoking status: Never Smoker   . Smokeless tobacco: Never Used  . Alcohol Use: 0.0 oz/week     Comment: social   OB History    Gravida Para Term Preterm AB TAB SAB Ectopic Multiple Living   2 2 1 1  0 0 0 0 0 2     Review of Systems  Constitutional: Negative for fever.  Respiratory: Positive for shortness of breath. Negative for cough.   Cardiovascular: Positive for chest pain.  Gastrointestinal: Positive for nausea and abdominal pain. Negative for vomiting.  Musculoskeletal: Negative for back pain.  All other systems reviewed and are negative.     Allergies  Amoxicillin; Dilaudid; and Iohexol  Home Medications   Prior to Admission medications   Medication Sig Start Date End Date Taking? Authorizing  Provider  cephALEXin (KEFLEX) 500 MG capsule Take 1 capsule (500 mg total) by mouth once. Patient not taking: Reported on 10/07/2014 10/03/14   Keitha Butte, CNM  doxycycline (VIBRAMYCIN) 100 MG capsule Take 1 capsule (100 mg total) by mouth 2 (two) times daily. Patient not taking: Reported on 10/18/2014 10/07/14   Dalia Heading, PA-C  HYDROcodone-acetaminophen (NORCO/VICODIN) 5-325 MG per tablet Take 1 tablet by mouth every 6 (six) hours as needed for moderate pain. Patient not taking: Reported on 10/18/2014 10/07/14   Dalia Heading, PA-C  ibuprofen (ADVIL,MOTRIN) 800 MG tablet Take 1 tablet (800 mg total) by  mouth every 6 (six) hours as needed (pain). Patient not taking: Reported on 10/07/2014 10/03/14   Keitha Butte, CNM  norgestimate-ethinyl estradiol (SPRINTEC 28) 0.25-35 MG-MCG tablet Take 1 tablet by mouth daily. Patient not taking: Reported on 10/07/2014 10/03/14   Keitha Butte, CNM   BP 116/70 mmHg  Pulse 101  Temp(Src) 98.8 F (37.1 C) (Oral)  Resp 20  SpO2 100%   Physical Exam  Constitutional: She is oriented to person, place, and time. She appears well-developed and well-nourished.  HENT:  Head: Normocephalic and atraumatic.  Cardiovascular: Normal rate and regular rhythm.   No murmur heard. Pulmonary/Chest: Effort normal and breath sounds normal. No respiratory distress.  Abdominal: Soft. There is tenderness (mild) in the epigastric area. There is no rebound and no guarding.  Musculoskeletal: She exhibits no edema or tenderness.  Neurological: She is alert and oriented to person, place, and time.  Skin: Skin is warm and dry.  Psychiatric: She has a normal mood and affect. Her behavior is normal.  Nursing note and vitals reviewed.   ED Course  Procedures (including critical care time) DIAGNOSTIC STUDIES: Oxygen Saturation is 100% on RA, normal by my interpretation.    COORDINATION OF CARE: 2:02 AM-Discussed treatment plan with pt at bedside and pt agreed to plan.     Labs Review Labs Reviewed  BASIC METABOLIC PANEL - Abnormal; Notable for the following:    Sodium 134 (*)    Glucose, Bld 111 (*)    Calcium 8.8 (*)    All other components within normal limits  CBC - Abnormal; Notable for the following:    WBC 11.3 (*)    Hemoglobin 11.6 (*)    HCT 34.9 (*)    All other components within normal limits  I-STAT TROPOININ, ED    Imaging Review Dg Chest 2 View  10/18/2014   CLINICAL DATA:  Acute onset of centralized chest pain and cough. Initial encounter.  EXAM: CHEST  2 VIEW  COMPARISON:  Chest radiograph performed 05/17/2014  FINDINGS: The lungs are well-aerated  and clear. There is no evidence of focal opacification, pleural effusion or pneumothorax.  The heart is normal in size; the mediastinal contour is within normal limits. No acute osseous abnormalities are seen. Clips are noted within the right upper quadrant, reflecting prior cholecystectomy.  IMPRESSION: No acute cardiopulmonary process seen.   Electronically Signed   By: Garald Balding M.D.   On: 10/18/2014 01:31     EKG Interpretation   Date/Time:  Tuesday October 18 2014 00:36:04 EDT Ventricular Rate:  99 PR Interval:  148 QRS Duration: 70 QT Interval:  340 QTC Calculation: 436 R Axis:   57 Text Interpretation:  Normal sinus rhythm Nonspecific T wave abnormality  Abnormal ECG Confirmed by OTTER  MD, OLGA ([id number]) on 10/18/2014 12:36:13 AM      MDM   Final diagnoses:  Chest pain, unspecified chest pain type   Patient here for evaluation of chest discomfort after taking aspirin and alcohol. Patient did over-take her aspirin (8, 81 mg tablets) but this was not a suicide attempt. History of presentation is not consistent with ACS or PE. Patient eloped from the department prior to being able to recheck the patient following GI cocktail.  I personally performed the services described in this documentation, which was scribed in my presence. The recorded information has been reviewed and is accurate.      Quintella Reichert, MD 10/18/14 [phone]

## 2014-10-18 NOTE — ED Notes (Signed)
Pt admits to taking 8 or 10 81mg  ASA with ETOH. Denies SI/HA. Just wanted "headache to stop."

## 2014-10-18 NOTE — ED Notes (Signed)
Pt. reports central chest tightness with SOB and emesis onset this evening , occasional dry cough , denies diaphoresis .

## 2014-10-20 ENCOUNTER — Emergency Department (HOSPITAL_COMMUNITY)
Admission: EM | Admit: 2014-10-20 | Discharge: 2014-10-20 | Disposition: A | Discharge status: Home / Self Care | Payer: Medicaid Other | Attending: Emergency Medicine | Admitting: Emergency Medicine

## 2014-10-20 ENCOUNTER — Encounter (HOSPITAL_COMMUNITY): Payer: Self-pay | Admitting: *Deleted

## 2014-10-20 DIAGNOSIS — I10 Essential (primary) hypertension: Secondary | ICD-10-CM | POA: Insufficient documentation

## 2014-10-20 DIAGNOSIS — R05 Cough: Secondary | ICD-10-CM | POA: Insufficient documentation

## 2014-10-20 DIAGNOSIS — M791 Myalgia: Secondary | ICD-10-CM | POA: Insufficient documentation

## 2014-10-20 DIAGNOSIS — R509 Fever, unspecified: Secondary | ICD-10-CM

## 2014-10-20 DIAGNOSIS — G8929 Other chronic pain: Secondary | ICD-10-CM | POA: Insufficient documentation

## 2014-10-20 DIAGNOSIS — J029 Acute pharyngitis, unspecified: Secondary | ICD-10-CM | POA: Insufficient documentation

## 2014-10-20 DIAGNOSIS — Z880 Allergy status to penicillin: Secondary | ICD-10-CM | POA: Insufficient documentation

## 2014-10-20 DIAGNOSIS — R112 Nausea with vomiting, unspecified: Secondary | ICD-10-CM | POA: Insufficient documentation

## 2014-10-20 DIAGNOSIS — H9203 Otalgia, bilateral: Secondary | ICD-10-CM | POA: Insufficient documentation

## 2014-10-20 DIAGNOSIS — Z8719 Personal history of other diseases of the digestive system: Secondary | ICD-10-CM | POA: Insufficient documentation

## 2014-10-20 DIAGNOSIS — R000 Tachycardia, unspecified: Secondary | ICD-10-CM | POA: Insufficient documentation

## 2014-10-20 DIAGNOSIS — Z87440 Personal history of urinary (tract) infections: Secondary | ICD-10-CM | POA: Insufficient documentation

## 2014-10-20 LAB — URINE MICROSCOPIC-ADD ON

## 2014-10-20 LAB — COMPREHENSIVE METABOLIC PANEL
ALT: 13 U/L — ABNORMAL LOW (ref 14–54)
AST: 17 U/L (ref 15–41)
Albumin: 3.9 g/dL (ref 3.5–5.0)
Alkaline Phosphatase: 69 U/L (ref 38–126)
Anion gap: 8 (ref 5–15)
BUN: 7 mg/dL (ref 6–20)
CO2: 26 mmol/L (ref 22–32)
Calcium: 9.4 mg/dL (ref 8.9–10.3)
Chloride: 104 mmol/L (ref 101–111)
Creatinine, Ser: 0.66 mg/dL (ref 0.44–1.00)
GFR calc Af Amer: >60 mL/min (ref >60)
GFR calc non Af Amer: >60 mL/min (ref >60)
Glucose, Bld: 93 mg/dL (ref 65–99)
Potassium: 3.7 mmol/L (ref 3.5–5.1)
Sodium: 138 mmol/L (ref 135–145)
Total Bilirubin: 0.3 mg/dL (ref 0.3–1.2)
Total Protein: 7.4 g/dL (ref 6.5–8.1)

## 2014-10-20 LAB — URINALYSIS, ROUTINE W REFLEX MICROSCOPIC
Bilirubin Urine: NEGATIVE
Glucose, UA: NEGATIVE mg/dL
Hgb urine dipstick: NEGATIVE
Ketones, ur: NEGATIVE mg/dL
Nitrite: NEGATIVE
Protein, ur: NEGATIVE mg/dL
Specific Gravity, Urine: 1.028 (ref 1.005–1.030)
Urobilinogen, UA: 0.2 mg/dL (ref 0.0–1.0)
pH: 6.5 (ref 5.0–8.0)

## 2014-10-20 LAB — I-STAT CG4 LACTIC ACID, ED: Lactic Acid, Venous: 1.38 mmol/L (ref 0.5–2.0)

## 2014-10-20 MED ORDER — MORPHINE SULFATE 4 MG/ML IJ SOLN
4.0000 mg | Freq: Once | INTRAMUSCULAR | Status: AC
  Administered 2014-10-20: 4 mg via INTRAVENOUS
  Filled 2014-10-20: qty 1

## 2014-10-20 MED ORDER — KETOROLAC TROMETHAMINE 15 MG/ML IJ SOLN
15.0000 mg | Freq: Once | INTRAMUSCULAR | Status: AC
  Administered 2014-10-20: 15 mg via INTRAVENOUS
  Filled 2014-10-20: qty 1

## 2014-10-20 MED ORDER — ACETAMINOPHEN 325 MG PO TABS
650.0000 mg | ORAL_TABLET | Freq: Once | ORAL | Status: AC | PRN
  Administered 2014-10-20: 650 mg via ORAL

## 2014-10-20 MED ORDER — ACETAMINOPHEN 325 MG PO TABS
ORAL_TABLET | ORAL | Status: AC
  Filled 2014-10-20: qty 2

## 2014-10-20 NOTE — ED Notes (Signed)
Pt reports chills, generalized body aches, headache, n/v for 2 days.

## 2014-10-20 NOTE — Discharge Instructions (Signed)

## 2014-10-22 ENCOUNTER — Encounter (HOSPITAL_COMMUNITY): Payer: Self-pay | Admitting: *Deleted

## 2014-10-22 ENCOUNTER — Emergency Department (HOSPITAL_COMMUNITY)
Admission: EM | Admit: 2014-10-22 | Discharge: 2014-10-22 | Disposition: A | Discharge status: Home / Self Care | Payer: Medicaid Other | Attending: Emergency Medicine | Admitting: Emergency Medicine

## 2014-10-22 DIAGNOSIS — G8929 Other chronic pain: Secondary | ICD-10-CM | POA: Insufficient documentation

## 2014-10-22 DIAGNOSIS — Z880 Allergy status to penicillin: Secondary | ICD-10-CM | POA: Insufficient documentation

## 2014-10-22 DIAGNOSIS — Z87440 Personal history of urinary (tract) infections: Secondary | ICD-10-CM | POA: Insufficient documentation

## 2014-10-22 DIAGNOSIS — Z8719 Personal history of other diseases of the digestive system: Secondary | ICD-10-CM | POA: Insufficient documentation

## 2014-10-22 DIAGNOSIS — J020 Streptococcal pharyngitis: Secondary | ICD-10-CM | POA: Insufficient documentation

## 2014-10-22 LAB — URINE CULTURE: Special Requests: NORMAL

## 2014-10-22 LAB — RAPID STREP SCREEN (MED CTR MEBANE ONLY): Streptococcus, Group A Screen (Direct): POSITIVE — AB

## 2014-10-22 MED ORDER — PENICILLIN G BENZATHINE 1200000 UNIT/2ML IM SUSP
1.2000 10*6.[IU] | Freq: Once | INTRAMUSCULAR | Status: AC
  Administered 2014-10-22: 1.2 10*6.[IU] via INTRAMUSCULAR
  Filled 2014-10-22: qty 2

## 2014-10-22 MED ORDER — ACETAMINOPHEN 325 MG PO TABS
325.0000 mg | ORAL_TABLET | Freq: Once | ORAL | Status: AC
  Administered 2014-10-22: 325 mg via ORAL
  Filled 2014-10-22: qty 1

## 2014-10-22 NOTE — Discharge Instructions (Signed)
Pharyngitis Pharyngitis is redness, pain, and swelling (inflammation) of your pharynx.  CAUSES  Pharyngitis is usually caused by infection. Most of the time, these infections are from viruses (viral) and are part of a cold. However, sometimes pharyngitis is caused by bacteria (bacterial). Pharyngitis can also be caused by allergies. Viral pharyngitis may be spread from person to person by coughing, sneezing, and personal items or utensils (cups, forks, spoons, toothbrushes). Bacterial pharyngitis may be spread from person to person by more intimate contact, such as kissing.  SIGNS AND SYMPTOMS  Symptoms of pharyngitis include:   Sore throat.   Tiredness (fatigue).   Low-grade fever.   Headache.  Joint pain and muscle aches.  Skin rashes.  Swollen lymph nodes.  Plaque-like film on throat or tonsils (often seen with bacterial pharyngitis). DIAGNOSIS  Your health care provider will ask you questions about your illness and your symptoms. Your medical history, along with a physical exam, is often all that is needed to diagnose pharyngitis. Sometimes, a rapid strep test is done. Other lab tests may also be done, depending on the suspected cause.  TREATMENT  Viral pharyngitis will usually get better in 3-4 days without the use of medicine. Bacterial pharyngitis is treated with medicines that kill germs (antibiotics).  HOME CARE INSTRUCTIONS   Drink enough water and fluids to keep your urine clear or pale yellow.   Only take over-the-counter or prescription medicines as directed by your health care provider:   If you are prescribed antibiotics, make sure you finish them even if you start to feel better.   Do not take aspirin.   Get lots of rest.   Gargle with 8 oz of salt water ( tsp of salt per 1 qt of water) as often as every 1-2 hours to soothe your throat.   Throat lozenges (if you are not at risk for choking) or sprays may be used to soothe your throat. SEEK MEDICAL  CARE IF:   You have large, tender lumps in your neck.  You have a rash.  You cough up green, yellow-brown, or bloody spit. SEEK IMMEDIATE MEDICAL CARE IF:   Your neck becomes stiff.  You drool or are unable to swallow liquids.  You vomit or are unable to keep medicines or liquids down.  You have severe pain that does not go away with the use of recommended medicines.  You have trouble breathing (not caused by a stuffy nose). MAKE SURE YOU:   Understand these instructions.  Will watch your condition.  Will get help right away if you are not doing well or get worse. Document Released: 03/04/2005 Document Revised: 12/23/2012 Document Reviewed: 11/09/2012 [facility] Patient Information 2015 [place], [place]. This information is not intended to replace advice given to you by your health care provider. Make sure you discuss any questions you have with your health care provider.  Please monitor for new or worsening signs or symptoms, return immediately if any present. Please follow-up with her primary care provider: [facility] for further evaluation if symptoms persist. Please use Tylenol or ibuprofen as needed for pain. Please reattach information

## 2014-10-22 NOTE — ED Notes (Signed)
Pt reports she was here recently for fever and vomiting. Reports still having fever, bodyaches, now has sore throat, ear pain and headache. Reports mild nausea, no diarrhea. No acute distress noted.

## 2014-10-22 NOTE — ED Provider Notes (Signed)
CSN: [id number]     Arrival date & time 10/22/14  1240 History   First MD Initiated Contact with Patient 10/22/14 1406     Chief Complaint  Patient presents with  . Fever  . Sore Throat   HPI   24 year old female presents today with sore throat, fever, body aches. Patient reports symptoms present for the last 2 days. She reports she was seen in the ED for similar presentation, but did not have the sore throat at the time. Patient reports symptoms have continued to persist. She reports she is able tolerate by mouth, but has some pain with swallowing. Patient has not tried any at-home therapies. She denies neck stiffness, difficulty swallowing, dizziness, abdominal pain, cough, shortness breath, chest pain.    Past Medical History  Diagnosis Date  . UTI (lower urinary tract infection)   . Headache(784.0)     chronic  . Gallstones   . Hypertension     GHTN with last pg  . Pregnancy induced hypertension    Past Surgical History  Procedure Laterality Date  . Cholecystectomy N/A 07/27/2012    Procedure: LAPAROSCOPIC CHOLECYSTECTOMY WITH INTRAOPERATIVE CHOLANGIOGRAM;  Surgeon: Madilyn Hook, DO;  Location: [facility];  Service: General;  Laterality: N/A;  . Appendectomy    . Laparoscopic appendectomy N/A 12/09/2012    Procedure: APPENDECTOMY LAPAROSCOPIC;  Surgeon: Gwenyth Ober, MD;  Location: [facility] OR;  Service: General;  Laterality: N/A;   Family History  Problem Relation Age of Onset  . Asthma Mother   . Hypertension Mother   . Parkinsonism Mother   . Diabetes Maternal Grandmother   . Hypertension Maternal Grandmother   . Stroke Maternal Grandmother   . Anesthesia problems Neg Hx   . Malignant hyperthermia Neg Hx   . Pseudochol deficiency Neg Hx   . Hypotension Neg Hx   . Asthma Brother    History  Substance Use Topics  . Smoking status: Never Smoker   . Smokeless tobacco: Never Used  . Alcohol Use: 0.0 oz/week     Comment: social   OB History    Gravida Para Term Preterm AB  TAB SAB Ectopic Multiple Living   2 2 1 1  0 0 0 0 0 2     Review of Systems  All other systems reviewed and are negative.   Allergies  Amoxicillin; Dilaudid; and Iohexol  Home Medications   Prior to Admission medications   Medication Sig Start Date End Date Taking? Authorizing Provider  norgestimate-ethinyl estradiol (SPRINTEC 28) 0.25-35 MG-MCG tablet Take 1 tablet by mouth daily. Patient not taking: Reported on 10/07/2014 10/03/14   Keitha Butte, CNM   BP 103/65 mmHg  Pulse 90  Temp(Src) 99.1 F (37.3 C) (Oral)  Resp 18  Ht 5\' 2"  (1.575 m)  Wt 229 lb 3 oz (103.959 kg)  BMI 41.91 kg/m2  SpO2 100%   Physical Exam  Constitutional: She is oriented to person, place, and time. She appears well-developed and well-nourished.  HENT:  Head: Normocephalic and atraumatic.  Mouth/Throat: Uvula is midline. No oropharyngeal exudate, posterior oropharyngeal edema or posterior oropharyngeal erythema.  Bilateral tonsillar exudate, minimal swelling  Eyes: Conjunctivae are normal. Pupils are equal, round, and reactive to light. Right eye exhibits no discharge. Left eye exhibits no discharge. No scleral icterus.  Neck: Normal range of motion. No JVD present. No tracheal deviation present.  Symmetrical no signs of soft tissue swelling, full active pain-free range of motion.  Cardiovascular: Regular rhythm, normal heart sounds and intact  distal pulses.   Pulmonary/Chest: Effort normal and breath sounds normal. No stridor. No respiratory distress. She has no wheezes. She has no rales. She exhibits no tenderness.  Abdominal: Soft. Bowel sounds are normal.  Musculoskeletal: Normal range of motion. She exhibits no edema.  Lymphadenopathy:    She has cervical adenopathy.  Neurological: She is alert and oriented to person, place, and time. Coordination normal.  Psychiatric: She has a normal mood and affect. Her behavior is normal. Judgment and thought content normal.  Nursing note and vitals  reviewed.   ED Course  Procedures (including critical care time) Labs Review Labs Reviewed  RAPID STREP SCREEN (NOT AT [facility]) - Abnormal; Notable for the following:    Streptococcus, Group A Screen (Direct) POSITIVE (*)    All other components within normal limits    Imaging Review No results found.   EKG Interpretation None      MDM   Final diagnoses:  Strep pharyngitis    Labs: Rapid strep positive  Imaging:  Consults:  Therapeutics: Penicillin G 1.2 million units Discharge Meds:   Assessment/Plan:  Pt presents with likely strep pharyngitis. No difficulty swallowing, drooling, dysphonia,muffled voice, stridor, swelling of the neck, trismus, mouth pain, swelling/ pain in submandibular area or floor of mouth ,assymetry of tonsils, or ulcerations; unlikely epiglottitis, PTA, submandibular space infection, retropharyngeal space infection, or HIV. Pt treated here in the ED with therapeutics listed above, given strict return precautions, PCP follow-up for re-evaluation if symptoms persist beyond 5-7 days in duration, return to the ED if they worsen. Pt verbalized understanding and agreement to today's plan and had no further questions or concerns at the time of discharge.          Okey Regal, PA-C 10/22/14 [place], MD 10/22/14 [phone]

## 2014-10-30 NOTE — ED Provider Notes (Signed)
CSN: [id number]     Arrival date & time 10/20/14  1640 History   First MD Initiated Contact with Patient 10/20/14 2034     Chief Complaint  Patient presents with  . Fever  . Generalized Body Aches     (Consider location/radiation/quality/duration/timing/severity/associated sxs/prior Treatment) HPI   24 year old female with body aches and subjective fever. Onset about 2 days ago. Initially with nausea and vomiting. Progressed to having body ache shortly later. Sore throat and bilateral ear pain. Occasional cough which is nonproductive. No shortness of breath. No rash. No urinary complaints. No unusual vaginal bleeding or discharge. No sick contacts. No diarrhea.  Past Medical History  Diagnosis Date  . UTI (lower urinary tract infection)   . Headache(784.0)     chronic  . Gallstones   . Hypertension     GHTN with last pg  . Pregnancy induced hypertension    Past Surgical History  Procedure Laterality Date  . Cholecystectomy N/A 07/27/2012    Procedure: LAPAROSCOPIC CHOLECYSTECTOMY WITH INTRAOPERATIVE CHOLANGIOGRAM;  Surgeon: Madilyn Hook, DO;  Location: [facility];  Service: General;  Laterality: N/A;  . Appendectomy    . Laparoscopic appendectomy N/A 12/09/2012    Procedure: APPENDECTOMY LAPAROSCOPIC;  Surgeon: Gwenyth Ober, MD;  Location: [facility] OR;  Service: General;  Laterality: N/A;   Family History  Problem Relation Age of Onset  . Asthma Mother   . Hypertension Mother   . Parkinsonism Mother   . Diabetes Maternal Grandmother   . Hypertension Maternal Grandmother   . Stroke Maternal Grandmother   . Anesthesia problems Neg Hx   . Malignant hyperthermia Neg Hx   . Pseudochol deficiency Neg Hx   . Hypotension Neg Hx   . Asthma Brother    Social History  Substance Use Topics  . Smoking status: Never Smoker   . Smokeless tobacco: Never Used  . Alcohol Use: 0.0 oz/week     Comment: social   OB History    Gravida Para Term Preterm AB TAB SAB Ectopic Multiple Living   2 2 1  1  0 0 0 0 0 2     Review of Systems  All systems reviewed and negative, other than as noted in HPI.   Allergies  Amoxicillin; Dilaudid; and Iohexol  Home Medications   Prior to Admission medications   Medication Sig Start Date End Date Taking? Authorizing Provider  norgestimate-ethinyl estradiol (SPRINTEC 28) 0.25-35 MG-MCG tablet Take 1 tablet by mouth daily. Patient not taking: Reported on 10/07/2014 10/03/14   Keitha Butte, CNM   BP 108/58 mmHg  Pulse 101  Temp(Src) 100.2 F (37.9 C) (Oral)  Resp 16  Ht 5\' 2"  (1.575 m)  Wt 231 lb (104.781 kg)  BMI 42.24 kg/m2  SpO2 98% Physical Exam  Constitutional: She appears well-developed and well-nourished. No distress.  HENT:  Head: Normocephalic and atraumatic.  Right Ear: External ear normal.  Left Ear: External ear normal.  Pharyngeal erythema and tonsillar hypertrophy without exudate. Uvula midline. Normal sounding voice. Handling secretions. Neck supple. No nodes. TMs clear bilaterally.  Eyes: Conjunctivae are normal. Right eye exhibits no discharge. Left eye exhibits no discharge.  Neck: Neck supple.  Cardiovascular: Regular rhythm and normal heart sounds.  Exam reveals no gallop and no friction rub.   No murmur heard. Mild tachycardia  Pulmonary/Chest: Effort normal and breath sounds normal. No respiratory distress.  Abdominal: Soft. She exhibits no distension. There is no tenderness.  Musculoskeletal: She exhibits no edema or tenderness.  Neurological:  She is alert.  Skin: Skin is warm and dry.  Psychiatric: She has a normal mood and affect. Her behavior is normal. Thought content normal.  Nursing note and vitals reviewed.   ED Course  Procedures (including critical care time) Labs Review Labs Reviewed  COMPREHENSIVE METABOLIC PANEL - Abnormal; Notable for the following:    ALT 13 (*)    All other components within normal limits  URINALYSIS, ROUTINE W REFLEX MICROSCOPIC (NOT AT [facility]) - Abnormal; Notable for the  following:    APPearance CLOUDY (*)    Leukocytes, UA MODERATE (*)    All other components within normal limits  URINE MICROSCOPIC-ADD ON - Abnormal; Notable for the following:    Squamous Epithelial / LPF MANY (*)    Bacteria, UA MANY (*)    All other components within normal limits  URINE CULTURE  I-STAT CG4 LACTIC ACID, ED  I-STAT CG4 LACTIC ACID, ED    Imaging Review No results found. I, Savonna Birchmeier, personally reviewed and evaluated these images and lab results as part of my medical decision-making.   EKG Interpretation None      MDM   Final diagnoses:  Febrile illness    24 year old female with febrile illness. Pharyngitis on exam without evidence of serious deep space infection or airway compromise. Plan symptomatic treatment for likely viral illness. Return precautions were discussed.  Virgel Manifold, MD 10/30/14 1058

## 2014-12-06 ENCOUNTER — Emergency Department (HOSPITAL_COMMUNITY)
Admission: EM | Admit: 2014-12-06 | Discharge: 2014-12-06 | Discharge status: Against Medical Advice | Payer: Medicaid Other | Attending: Emergency Medicine | Admitting: Emergency Medicine

## 2014-12-06 ENCOUNTER — Encounter (HOSPITAL_COMMUNITY): Payer: Self-pay | Admitting: Emergency Medicine

## 2014-12-06 DIAGNOSIS — R0602 Shortness of breath: Secondary | ICD-10-CM | POA: Insufficient documentation

## 2014-12-06 DIAGNOSIS — G43909 Migraine, unspecified, not intractable, without status migrainosus: Secondary | ICD-10-CM | POA: Insufficient documentation

## 2014-12-06 DIAGNOSIS — I10 Essential (primary) hypertension: Secondary | ICD-10-CM | POA: Insufficient documentation

## 2014-12-06 DIAGNOSIS — R079 Chest pain, unspecified: Secondary | ICD-10-CM | POA: Insufficient documentation

## 2014-12-06 NOTE — ED Notes (Signed)
Patient states chest pain x 2 days constant.  Patient states "sometimes I can't catch my breath".  Patient states she also has migraine that started 2 days ago.   Patient states took tylenol and excedrin at home for pain.  Patient states didn't help at all.

## 2014-12-06 NOTE — ED Notes (Signed)
Patient called for room x3. No answer

## 2014-12-06 NOTE — ED Notes (Signed)
Patient provided urine sample which will remain with her paperwork if needed

## 2014-12-12 ENCOUNTER — Inpatient Hospital Stay (HOSPITAL_COMMUNITY)
Admission: AD | Admit: 2014-12-12 | Discharge: 2014-12-12 | Disposition: A | Discharge status: Home / Self Care | Payer: Self-pay | Source: Ambulatory Visit | Attending: Family Medicine | Admitting: Family Medicine

## 2014-12-12 ENCOUNTER — Inpatient Hospital Stay (HOSPITAL_COMMUNITY): Payer: Medicaid Other

## 2014-12-12 ENCOUNTER — Encounter (HOSPITAL_COMMUNITY): Payer: Self-pay

## 2014-12-12 DIAGNOSIS — O3680X0 Pregnancy with inconclusive fetal viability, not applicable or unspecified: Secondary | ICD-10-CM

## 2014-12-12 DIAGNOSIS — Z3A01 Less than 8 weeks gestation of pregnancy: Secondary | ICD-10-CM | POA: Insufficient documentation

## 2014-12-12 DIAGNOSIS — O26899 Other specified pregnancy related conditions, unspecified trimester: Secondary | ICD-10-CM

## 2014-12-12 DIAGNOSIS — O9989 Other specified diseases and conditions complicating pregnancy, childbirth and the puerperium: Secondary | ICD-10-CM

## 2014-12-12 DIAGNOSIS — R109 Unspecified abdominal pain: Secondary | ICD-10-CM | POA: Insufficient documentation

## 2014-12-12 DIAGNOSIS — O209 Hemorrhage in early pregnancy, unspecified: Secondary | ICD-10-CM | POA: Insufficient documentation

## 2014-12-12 LAB — POCT PREGNANCY, URINE: Preg Test, Ur: POSITIVE — AB

## 2014-12-12 LAB — CBC
HCT: 33.4 % — ABNORMAL LOW (ref 36.0–46.0)
Hemoglobin: 10.8 g/dL — ABNORMAL LOW (ref 12.0–15.0)
MCH: 26.9 pg (ref 26.0–34.0)
MCHC: 32.3 g/dL (ref 30.0–36.0)
MCV: 83.1 fL (ref 78.0–100.0)
Platelets: 319 10*3/uL (ref 150–400)
RBC: 4.02 MIL/uL (ref 3.87–5.11)
RDW: 15 % (ref 11.5–15.5)
WBC: 10.5 10*3/uL (ref 4.0–10.5)

## 2014-12-12 LAB — URINALYSIS, ROUTINE W REFLEX MICROSCOPIC
Bilirubin Urine: NEGATIVE
Glucose, UA: NEGATIVE mg/dL
Ketones, ur: 15 mg/dL — AB
Leukocytes, UA: NEGATIVE
Nitrite: NEGATIVE
Protein, ur: NEGATIVE mg/dL
Specific Gravity, Urine: 1.025 (ref 1.005–1.030)
Urobilinogen, UA: 0.2 mg/dL (ref 0.0–1.0)
pH: 6.5 (ref 5.0–8.0)

## 2014-12-12 LAB — HCG, QUANTITATIVE, PREGNANCY: hCG, Beta Chain, Quant, S: 2999 m[IU]/mL — ABNORMAL HIGH (ref <5)

## 2014-12-12 LAB — ABO/RH: ABO/RH(D): A POS

## 2014-12-12 LAB — URINE MICROSCOPIC-ADD ON

## 2014-12-12 MED ORDER — IBUPROFEN 600 MG PO TABS: 600.0000 mg | ORAL_TABLET | Freq: Four times a day (QID) | ORAL | Status: DC | PRN

## 2014-12-12 MED ORDER — KETOROLAC TROMETHAMINE 60 MG/2ML IM SOLN
60.0000 mg | Freq: Once | INTRAMUSCULAR | Status: AC
  Administered 2014-12-12: 60 mg via INTRAMUSCULAR
  Filled 2014-12-12: qty 2

## 2014-12-12 MED ORDER — GI COCKTAIL ~~LOC~~
30.0000 mL | Freq: Once | ORAL | Status: AC
Start: 2014-12-12 — End: 2014-12-12
  Administered 2014-12-12: 30 mL via ORAL
  Filled 2014-12-12: qty 30

## 2014-12-12 NOTE — MAU Note (Signed)
Severe cramping in abd, and vomiting for the past 3 days.

## 2014-12-12 NOTE — MAU Note (Signed)
IUD placed June 2014, initially did not have period.  In July started having periods, has not checked strings to see if still there. Spotted yesterday.

## 2014-12-12 NOTE — MAU Note (Signed)
Pt states here for nausea and vomiting, and is unsure if she is pregnant.

## 2014-12-12 NOTE — MAU Provider Note (Signed)
History     CSN: [id number]  Arrival date and time: 12/12/14 1650   First Provider Initiated Contact with Patient 12/12/14 1733      Chief Complaint  Patient presents with  . Abdominal Pain  . Emesis   HPI   [name] is a 24 y.o. female [phone] at [redacted]w[redacted]d presenting with vaginal spotting and abdominal pain. She had an IUD placed 3 years ago; positive pregnancy test at home this week. She has not been able to feel her IUD strings for a while.   + cramping throughout the lower part of his stomach. He has noticed a small amount of spotting throughout her lower abdomen.  She has not taken anything for pain.  The pain is pressure like at times. Her bleeding is currently none.    OB History    Gravida Para Term Preterm AB TAB SAB Ectopic Multiple Living   3 2 1 1  0 0 0 0 0 2      Past Medical History  Diagnosis Date  . UTI (lower urinary tract infection)   . Headache(784.0)     chronic  . Gallstones   . Hypertension     GHTN with last pg  . Pregnancy induced hypertension     Past Surgical History  Procedure Laterality Date  . Cholecystectomy N/A 07/27/2012    Procedure: LAPAROSCOPIC CHOLECYSTECTOMY WITH INTRAOPERATIVE CHOLANGIOGRAM;  Surgeon: Madilyn Hook, DO;  Location: [facility];  Service: General;  Laterality: N/A;  . Appendectomy    . Laparoscopic appendectomy N/A 12/09/2012    Procedure: APPENDECTOMY LAPAROSCOPIC;  Surgeon: Gwenyth Ober, MD;  Location: [facility];  Service: General;  Laterality: N/A;    Family History  Problem Relation Age of Onset  . Asthma Mother   . Hypertension Mother   . Parkinsonism Mother   . Diabetes Maternal Grandmother   . Hypertension Maternal Grandmother   . Stroke Maternal Grandmother   . Anesthesia problems Neg Hx   . Malignant hyperthermia Neg Hx   . Pseudochol deficiency Neg Hx   . Hypotension Neg Hx   . Asthma Brother     Social History  Substance Use Topics  . Smoking status: Never Smoker   . Smokeless tobacco: Never  Used  . Alcohol Use: 0.0 oz/week     Comment: social    Allergies:  Allergies  Allergen Reactions  . Amoxicillin Nausea And Vomiting and Other (See Comments)    Tics & twitches  . Dilaudid [Hydromorphone Hcl] Itching  . Iohexol Hives and Itching    Prescriptions prior to admission  Medication Sig Dispense Refill Last Dose  . norgestimate-ethinyl estradiol (SPRINTEC 28) 0.25-35 MG-MCG tablet Take 1 tablet by mouth daily. (Patient not taking: Reported on 10/07/2014) 1 Package 11 Not Taking at Unknown time    Results for orders placed or performed during the hospital encounter of 12/12/14 (from the past 48 hour(s))  Urinalysis, Routine w reflex microscopic (not at [facility])     Status: Abnormal   Collection Time: 12/12/14  5:10 PM  Result Value Ref Range   Color, Urine YELLOW YELLOW   APPearance CLEAR CLEAR   Specific Gravity, Urine 1.025 1.005 - 1.030   pH 6.5 5.0 - 8.0   Glucose, UA NEGATIVE NEGATIVE mg/dL   Hgb urine dipstick SMALL (A) NEGATIVE   Bilirubin Urine NEGATIVE NEGATIVE   Ketones, ur 15 (A) NEGATIVE mg/dL   Protein, ur NEGATIVE NEGATIVE mg/dL   Urobilinogen, UA 0.2 0.0 - 1.0 mg/dL  Nitrite NEGATIVE NEGATIVE   Leukocytes, UA NEGATIVE NEGATIVE  Urine microscopic-add on     Status: None   Collection Time: 12/12/14  5:10 PM  Result Value Ref Range   Squamous Epithelial / LPF RARE RARE   WBC, UA 0-2 <3 WBC/hpf   RBC / HPF 0-2 <3 RBC/hpf   Bacteria, UA RARE RARE  Pregnancy, urine POC     Status: Abnormal   Collection Time: 12/12/14  5:11 PM  Result Value Ref Range   Preg Test, Ur POSITIVE (A) NEGATIVE    Comment:        THE SENSITIVITY OF THIS METHODOLOGY IS >24 mIU/mL   CBC     Status: Abnormal   Collection Time: 12/12/14  6:00 PM  Result Value Ref Range   WBC 10.5 4.0 - 10.5 K/uL   RBC 4.02 3.87 - 5.11 MIL/uL   Hemoglobin 10.8 (L) 12.0 - 15.0 g/dL   HCT 33.4 (L) 36.0 - 46.0 %   MCV 83.1 78.0 - 100.0 fL   MCH 26.9 26.0 - 34.0 pg   MCHC 32.3 30.0 - 36.0 g/dL    RDW 15.0 11.5 - 15.5 %   Platelets 319 150 - 400 K/uL  ABO/Rh     Status: None   Collection Time: 12/12/14  6:00 PM  Result Value Ref Range   ABO/RH(D) A POS   hCG, quantitative, pregnancy     Status: Abnormal   Collection Time: 12/12/14  6:00 PM  Result Value Ref Range   hCG, Beta Chain, Quant, S 2999 (H) <5 mIU/mL    Comment:          GEST. AGE      CONC.  (mIU/mL)   <=1 WEEK        5 - 50     2 WEEKS       50 - 500     3 WEEKS       100 - 10,000     4 WEEKS     1,000 - 30,000     5 WEEKS     3,500 - 115,000   6-8 WEEKS     12,000 - 270,000    12 WEEKS     15,000 - 220,000        FEMALE AND NON-PREGNANT FEMALE:     LESS THAN 5 mIU/mL     US Ob Comp Less 14 Wks  12/12/2014   CLINICAL DATA:  Vaginal bleeding in for treatments to pregnancy. Abdominal cramping and vomiting for 3 days.  EXAM: OBSTETRIC <14 WK [place] AND TRANSVAGINAL OB US  TECHNIQUE: Both transabdominal and transvaginal ultrasound examinations were performed for complete evaluation of the gestation as well as the maternal uterus, adnexal regions, and pelvic cul-de-sac. Transvaginal technique was performed to assess early pregnancy.  COMPARISON:  None.  FINDINGS: Intrauterine gestational sac: Not present.  Yolk sac:  Not present.  Embryo:  Not present.  Maternal uterus/adnexae: Endometrium is thin measuring 3 mm. No fluid in the endometrial canal. The right ovary measures 3.1 x 1.9 x 2.4 cm and contains physiologic follicles. The left ovary measures 3.3 x 3.5 x 2.1 cm and appears normal. There is no adnexal mass. There is no pelvic free fluid.  IMPRESSION: No intrauterine pregnancy. Endometrium is thin measuring 3 mm. No findings of ectopic pregnancy, both ovaries are normal. There is no adnexal mass or free fluid. Findings consistent with pregnancy of unknown location. Early intrauterine or ectopic pregnancy not yet detected sonographically are  considered versus failed pregnancy. Recommend follow-up quantitative B-HCG levels and  follow-up US based on B-HCG trending.   Electronically Signed   By: Jeb Levering M.D.   On: 12/12/2014 19:11   US Ob Transvaginal  12/12/2014   CLINICAL DATA:  Vaginal bleeding in for treatments to pregnancy. Abdominal cramping and vomiting for 3 days.  EXAM: OBSTETRIC <14 WK [place] AND TRANSVAGINAL OB US  TECHNIQUE: Both transabdominal and transvaginal ultrasound examinations were performed for complete evaluation of the gestation as well as the maternal uterus, adnexal regions, and pelvic cul-de-sac. Transvaginal technique was performed to assess early pregnancy.  COMPARISON:  None.  FINDINGS: Intrauterine gestational sac: Not present.  Yolk sac:  Not present.  Embryo:  Not present.  Maternal uterus/adnexae: Endometrium is thin measuring 3 mm. No fluid in the endometrial canal. The right ovary measures 3.1 x 1.9 x 2.4 cm and contains physiologic follicles. The left ovary measures 3.3 x 3.5 x 2.1 cm and appears normal. There is no adnexal mass. There is no pelvic free fluid.  IMPRESSION: No intrauterine pregnancy. Endometrium is thin measuring 3 mm. No findings of ectopic pregnancy, both ovaries are normal. There is no adnexal mass or free fluid. Findings consistent with pregnancy of unknown location. Early intrauterine or ectopic pregnancy not yet detected sonographically are considered versus failed pregnancy. Recommend follow-up quantitative B-HCG levels and follow-up US based on B-HCG trending.   Electronically Signed   By: Jeb Levering M.D.   On: 12/12/2014 19:11    Review of Systems  Constitutional: Negative for fever and chills.  Gastrointestinal: Positive for heartburn and abdominal pain. Negative for nausea and vomiting.  Neurological: Negative for dizziness.   Physical Exam   Blood pressure 120/75, pulse 102, temperature 98.7 F (37.1 C), temperature source Oral, resp. rate 20, height 5' 0.25" (1.53 m), weight 104.509 kg (230 lb 6.4 oz), last menstrual period 10/18/2014.  Physical Exam   Constitutional: She is oriented to person, place, and time. She appears well-developed and well-nourished. No distress.  HENT:  Head: Normocephalic.  Eyes: Pupils are equal, round, and reactive to light.  Neck: Normal range of motion.  GI: Soft. There is generalized tenderness. There is no rigidity, no rebound and no guarding.  Genitourinary:  Cervix closed, posterior. IUD strings not felt.    Musculoskeletal: Normal range of motion.  Neurological: She is alert and oriented to person, place, and time.  Skin: Skin is warm. She is not diaphoretic.  Psychiatric: Her behavior is normal.    MAU Course  Procedures  None  MDM  Discussed [place] results with Dr. Celesta Aver to the radiologist Dr. Marisue Humble regarding IUD and her and I reviewed previous images. CT scan done in July 2016 does not show and IUD in place, [place] today does not show an IUD.  IUD was not felt on bimanual exam.   Discussed patient with Dr. Nehemiah Settle; this pregnancy is not desired, however the patient wants to discuss the pregnancy with the FOB before making any decisions.  Discussed pain management with Dr. Nehemiah Settle.   Toradol 60 mg IM now.   A positive blood type   Assessment and Plan   A:  1. Abdominal pain in pregnancy   2. Vaginal bleeding in pregnancy, first trimester   3. Pregnancy of unknown anatomic location     P:  Discharge home in stable condition RX: ibuprofen for 2 days only Return to MAU in 48 hours for beta hcg level Return to MAU immediately if pain  worsens or bleeding worsens Pelvic rest Bleeding precautions.  Ectopic precautions   Lezlie Lye, NP 12/12/2014 8:08 PM

## 2014-12-13 ENCOUNTER — Encounter (HOSPITAL_COMMUNITY): Payer: Self-pay | Admitting: *Deleted

## 2014-12-13 ENCOUNTER — Inpatient Hospital Stay (HOSPITAL_COMMUNITY)
Admission: AD | Admit: 2014-12-13 | Discharge: 2014-12-13 | Disposition: A | Discharge status: Home / Self Care | Payer: Self-pay | Source: Ambulatory Visit | Attending: Obstetrics and Gynecology | Admitting: Obstetrics and Gynecology

## 2014-12-13 ENCOUNTER — Inpatient Hospital Stay (HOSPITAL_COMMUNITY): Payer: Medicaid Other

## 2014-12-13 DIAGNOSIS — O26891 Other specified pregnancy related conditions, first trimester: Secondary | ICD-10-CM | POA: Insufficient documentation

## 2014-12-13 DIAGNOSIS — Z3A08 8 weeks gestation of pregnancy: Secondary | ICD-10-CM | POA: Insufficient documentation

## 2014-12-13 DIAGNOSIS — R109 Unspecified abdominal pain: Secondary | ICD-10-CM | POA: Insufficient documentation

## 2014-12-13 DIAGNOSIS — O26899 Other specified pregnancy related conditions, unspecified trimester: Secondary | ICD-10-CM

## 2014-12-13 DIAGNOSIS — O009 Unspecified ectopic pregnancy without intrauterine pregnancy: Secondary | ICD-10-CM

## 2014-12-13 HISTORY — DX: Calculus of kidney: N20.0

## 2014-12-13 HISTORY — DX: Anemia, unspecified: D64.9

## 2014-12-13 HISTORY — DX: Unspecified ovarian cyst, unspecified side: N83.209

## 2014-12-13 LAB — COMPREHENSIVE METABOLIC PANEL
ALT: 11 U/L — ABNORMAL LOW (ref 14–54)
AST: 14 U/L — ABNORMAL LOW (ref 15–41)
Albumin: 3.8 g/dL (ref 3.5–5.0)
Alkaline Phosphatase: 60 U/L (ref 38–126)
Anion gap: 4 — ABNORMAL LOW (ref 5–15)
BUN: 8 mg/dL (ref 6–20)
CO2: 26 mmol/L (ref 22–32)
Calcium: 8.8 mg/dL — ABNORMAL LOW (ref 8.9–10.3)
Chloride: 107 mmol/L (ref 101–111)
Creatinine, Ser: 0.57 mg/dL (ref 0.44–1.00)
GFR calc Af Amer: >60 mL/min (ref >60)
GFR calc non Af Amer: >60 mL/min (ref >60)
Glucose, Bld: 78 mg/dL (ref 65–99)
Potassium: 3.9 mmol/L (ref 3.5–5.1)
Sodium: 137 mmol/L (ref 135–145)
Total Bilirubin: 0.4 mg/dL (ref 0.3–1.2)
Total Protein: 7.1 g/dL (ref 6.5–8.1)

## 2014-12-13 LAB — URINALYSIS, ROUTINE W REFLEX MICROSCOPIC
Bilirubin Urine: NEGATIVE
Glucose, UA: NEGATIVE mg/dL
Ketones, ur: NEGATIVE mg/dL
Leukocytes, UA: NEGATIVE
Nitrite: NEGATIVE
Protein, ur: NEGATIVE mg/dL
Specific Gravity, Urine: >1.03 — ABNORMAL HIGH (ref 1.005–1.030)
Urobilinogen, UA: 0.2 mg/dL (ref 0.0–1.0)
pH: 6 (ref 5.0–8.0)

## 2014-12-13 LAB — URINE MICROSCOPIC-ADD ON

## 2014-12-13 LAB — HIV ANTIBODY (ROUTINE TESTING W REFLEX): HIV Screen 4th Generation wRfx: NONREACTIVE

## 2014-12-13 LAB — HCG, QUANTITATIVE, PREGNANCY: hCG, Beta Chain, Quant, S: 3316 m[IU]/mL — ABNORMAL HIGH (ref <5)

## 2014-12-13 MED ORDER — OXYCODONE-ACETAMINOPHEN 5-325 MG PO TABS: 1.0000 | ORAL_TABLET | Freq: Four times a day (QID) | ORAL | Status: DC | PRN

## 2014-12-13 MED ORDER — PROMETHAZINE HCL 25 MG PO TABS: 12.5000 mg | ORAL_TABLET | Freq: Four times a day (QID) | ORAL | Status: DC | PRN

## 2014-12-13 MED ORDER — METHOTREXATE INJECTION FOR WOMEN'S HOSPITAL
50.0000 mg/m2 | Freq: Once | INTRAMUSCULAR | Status: AC
  Administered 2014-12-13: 105 mg via INTRAMUSCULAR
  Filled 2014-12-13: qty 2.1

## 2014-12-13 MED ORDER — OXYCODONE-ACETAMINOPHEN 5-325 MG PO TABS
2.0000 | ORAL_TABLET | ORAL | Status: AC
  Administered 2014-12-13: 2 via ORAL
  Filled 2014-12-13: qty 2

## 2014-12-13 NOTE — MAU Note (Signed)
Urine in lab 

## 2014-12-13 NOTE — Progress Notes (Signed)
Pt states pain is same just sleepy now. Texting. Sprite to pt

## 2014-12-13 NOTE — MAU Provider Note (Signed)
Chief Complaint: Abdominal Pain   First Provider Initiated Contact with Patient 12/13/14 1151      SUBJECTIVE HPI: Emma Chambers is a 24 y.o. [id number] at [redacted]w[redacted]d by LMP who presents to maternity admissions reporting increase in abdominal cramping and back pain. She denies bleeding today.  She was seen yesterday in MAU for vaginal bleeding and cramping and had quant hcg of 2999 with no IUP or ectopic on ultrasound.   She denies vaginal bleeding, vaginal itching/burning, urinary symptoms, h/a, dizziness, n/v, or fever/chills.     Abdominal Pain This is a recurrent problem. The current episode started in the past 7 days. The onset quality is gradual. The problem occurs intermittently. The problem has been waxing and waning. The pain is located in the generalized abdominal region. The pain is severe. The quality of the pain is cramping. The abdominal pain radiates to the back. Pertinent negatives include no constipation, diarrhea, dysuria, fever, frequency, headaches, nausea or vomiting. The pain is relieved by nothing. Treatments tried: ibuprofen. The treatment provided no relief.    Past Medical History  Diagnosis Date  . UTI (lower urinary tract infection)   . Headache(784.0)     chronic  . Gallstones   . Hypertension     GHTN with last pg  . Pregnancy induced hypertension   . Anemia   . Kidney stones   . Ovarian cyst    Past Surgical History  Procedure Laterality Date  . Cholecystectomy N/A 07/27/2012    Procedure: LAPAROSCOPIC CHOLECYSTECTOMY WITH INTRAOPERATIVE CHOLANGIOGRAM;  Surgeon: Madilyn Hook, DO;  Location: [facility];  Service: General;  Laterality: N/A;  . Appendectomy    . Laparoscopic appendectomy N/A 12/09/2012    Procedure: APPENDECTOMY LAPAROSCOPIC;  Surgeon: Gwenyth Ober, MD;  Location: [facility];  Service: General;  Laterality: N/A;   Social History   Social History  . Marital Status: Single    Spouse Name: N/A  . Number of Children: N/A  . Years of Education: N/A    Occupational History  . Not on file.   Social History Main Topics  . Smoking status: Former Research scientist (life sciences)  . Smokeless tobacco: Never Used     Comment: 2014  . Alcohol Use: 0.0 oz/week     Comment: social  . Drug Use: No  . Sexual Activity: Yes    Birth Control/ Protection: IUD     Comment: would like IUD    Other Topics Concern  . Not on file   Social History Narrative   No current facility-administered medications on file prior to encounter.   Current Outpatient Prescriptions on File Prior to Encounter  Medication Sig Dispense Refill  . ibuprofen (ADVIL,MOTRIN) 600 MG tablet Take 1 tablet (600 mg total) by mouth every 6 (six) hours as needed. 10 tablet 0   Allergies  Allergen Reactions  . Amoxicillin Nausea And Vomiting and Other (See Comments)    Tics & twitches  . Dilaudid [Hydromorphone Hcl] Itching  . Iohexol Hives and Itching    ROS:  Review of Systems  Constitutional: Negative for fever, chills and fatigue.  HENT: Negative for sinus pressure.   Eyes: Negative for photophobia.  Respiratory: Negative for shortness of breath.   Cardiovascular: Negative for chest pain.  Gastrointestinal: Positive for abdominal pain. Negative for nausea, vomiting, diarrhea and constipation.  Genitourinary: Negative for dysuria, frequency, flank pain, vaginal bleeding, vaginal discharge, difficulty urinating, vaginal pain and pelvic pain.  Musculoskeletal: Negative for neck pain.  Neurological: Negative for dizziness, weakness and  headaches.  Psychiatric/Behavioral: Negative.      I have reviewed patient's Past Medical Hx, Surgical Hx, Family Hx, Social Hx, medications and allergies.   Physical Exam   No data found.  VS wnl  Constitutional: Well-developed, well-nourished female in no acute distress.  Cardiovascular: normal rate Respiratory: normal effort GI: Abd soft, non-tender. Pos BS x 4 MS: Extremities nontender, no edema, normal ROM Neurologic: Alert and oriented x 4.   GU: Neg CVAT.  PELVIC EXAM: Deferred   LAB RESULTS No results found for this or any previous visit (from the past 24 hour(s)). Results for orders placed or performed during the hospital encounter of 12/13/14 (from the past 168 hour(s))  Urinalysis, Routine w reflex microscopic (not at [facility])   Collection Time: 12/13/14 10:35 AM  Result Value Ref Range   Color, Urine YELLOW YELLOW   APPearance CLEAR CLEAR   Specific Gravity, Urine >1.030 (H) 1.005 - 1.030   pH 6.0 5.0 - 8.0   Glucose, UA NEGATIVE NEGATIVE mg/dL   Hgb urine dipstick TRACE (A) NEGATIVE   Bilirubin Urine NEGATIVE NEGATIVE   Ketones, ur NEGATIVE NEGATIVE mg/dL   Protein, ur NEGATIVE NEGATIVE mg/dL   Urobilinogen, UA 0.2 0.0 - 1.0 mg/dL   Nitrite NEGATIVE NEGATIVE   Leukocytes, UA NEGATIVE NEGATIVE  Urine microscopic-add on   Collection Time: 12/13/14 10:35 AM  Result Value Ref Range   Squamous Epithelial / LPF FEW (A) RARE   WBC, UA 0-2 <3 WBC/hpf   RBC / HPF 0-2 <3 RBC/hpf   Bacteria, UA RARE RARE  hCG, quantitative, pregnancy   Collection Time: 12/13/14 11:35 AM  Result Value Ref Range   hCG, Beta Chain, Quant, S 3316 (H) <5 mIU/mL  Comprehensive metabolic panel   Collection Time: 12/13/14 11:35 AM  Result Value Ref Range   Sodium 137 135 - 145 mmol/L   Potassium 3.9 3.5 - 5.1 mmol/L   Chloride 107 101 - 111 mmol/L   CO2 26 22 - 32 mmol/L   Glucose, Bld 78 65 - 99 mg/dL   BUN 8 6 - 20 mg/dL   Creatinine, Ser 0.57 0.44 - 1.00 mg/dL   Calcium 8.8 (L) 8.9 - 10.3 mg/dL   Total Protein 7.1 6.5 - 8.1 g/dL   Albumin 3.8 3.5 - 5.0 g/dL   AST 14 (L) 15 - 41 U/L   ALT 11 (L) 14 - 54 U/L   Alkaline Phosphatase 60 38 - 126 U/L   Total Bilirubin 0.4 0.3 - 1.2 mg/dL   GFR calc non Af Amer >60 >60 mL/min   GFR calc Af Amer >60 >60 mL/min   Anion gap 4 (L) 5 - 15  Results for orders placed or performed during the hospital encounter of 12/12/14 (from the past 168 hour(s))  Urinalysis, Routine w reflex microscopic  (not at [facility])   Collection Time: 12/12/14  5:10 PM  Result Value Ref Range   Color, Urine YELLOW YELLOW   APPearance CLEAR CLEAR   Specific Gravity, Urine 1.025 1.005 - 1.030   pH 6.5 5.0 - 8.0   Glucose, UA NEGATIVE NEGATIVE mg/dL   Hgb urine dipstick SMALL (A) NEGATIVE   Bilirubin Urine NEGATIVE NEGATIVE   Ketones, ur 15 (A) NEGATIVE mg/dL   Protein, ur NEGATIVE NEGATIVE mg/dL   Urobilinogen, UA 0.2 0.0 - 1.0 mg/dL   Nitrite NEGATIVE NEGATIVE   Leukocytes, UA NEGATIVE NEGATIVE  Urine microscopic-add on   Collection Time: 12/12/14  5:10 PM  Result Value Ref Range  Squamous Epithelial / LPF RARE RARE   WBC, UA 0-2 <3 WBC/hpf   RBC / HPF 0-2 <3 RBC/hpf   Bacteria, UA RARE RARE  Pregnancy, urine POC   Collection Time: 12/12/14  5:11 PM  Result Value Ref Range   Preg Test, Ur POSITIVE (A) NEGATIVE  CBC   Collection Time: 12/12/14  6:00 PM  Result Value Ref Range   WBC 10.5 4.0 - 10.5 K/uL   RBC 4.02 3.87 - 5.11 MIL/uL   Hemoglobin 10.8 (L) 12.0 - 15.0 g/dL   HCT 33.4 (L) 36.0 - 46.0 %   MCV 83.1 78.0 - 100.0 fL   MCH 26.9 26.0 - 34.0 pg   MCHC 32.3 30.0 - 36.0 g/dL   RDW 15.0 11.5 - 15.5 %   Platelets 319 150 - 400 K/uL  HIV antibody   Collection Time: 12/12/14  6:00 PM  Result Value Ref Range   HIV Screen 4th Generation wRfx Non Reactive Non Reactive  hCG, quantitative, pregnancy   Collection Time: 12/12/14  6:00 PM  Result Value Ref Range   hCG, Beta Chain, Quant, S 2999 (H) <5 mIU/mL  ABO/Rh   Collection Time: 12/12/14  6:00 PM  Result Value Ref Range   ABO/RH(D) A POS     --/--/A POS (09/26 1800)  IMAGING US Ob Transvaginal  12/13/2014   CLINICAL DATA:  Abdominal pain in first trimester pregnancy. Pregnancy of unknown location. Gestational age by LMP of 8 weeks 0 days.  EXAM: TRANSVAGINAL OB ULTRASOUND  TECHNIQUE: Transvaginal ultrasound was performed for complete evaluation of the gestation as well as the maternal uterus, adnexal regions, and pelvic  cul-de-sac.  COMPARISON:  12/12/2014  FINDINGS: No intrauterine gestational sac or other fluid collection visualized in endometrial cavity. A tiny less than 1 cm fibroid is seen in the left uterine corpus.  The left ovary is normal in appearance.  A small mass is seen in the right adnexa adjacent to the right ovary on today's exam which measures 2.3 by 1.8 x 1.4 cm. This is suspicious for an ectopic pregnancy. There is no evidence of free fluid.  IMPRESSION: No IUP visualized.  2.3 cm right adnexal mass adjacent to right ovary, suspicious for ectopic pregnancy. No evidence of free fluid.  Tiny less than 1 cm uterine fibroid.  Critical Value/emergent results were called by telephone at the time of interpretation on 12/13/2014 at 2:03 pm to [place] , who verbally acknowledged these results.   Electronically Signed   By: Earle Gell M.D.   On: 12/13/2014 14:05   US Ob Transvaginal  12/12/2014   ADDENDUM REPORT: 12/12/2014 19:47 ADDENDUM: Addendum created to add additional history and findings. Patient with intrauterine device placed 2014. No intrauterine device is seen on the current exam. Review of CT of the abdomen/pelvis October 07, 2014 demonstrated no intrauterine device present in the uterus or abdominal cavity. Electronically Signed   By: Jeb Levering M.D.   On: 12/12/2014 19:47  12/12/2014   CLINICAL DATA:  Vaginal bleeding in for treatments to pregnancy. Abdominal cramping and vomiting for 3 days.  EXAM: OBSTETRIC <14 WK [place] AND TRANSVAGINAL OB US  TECHNIQUE: Both transabdominal and transvaginal ultrasound examinations were performed for complete evaluation of the gestation as well as the maternal uterus, adnexal regions, and pelvic cul-de-sac. Transvaginal technique was performed to assess early pregnancy.  COMPARISON:  None.  FINDINGS: Intrauterine gestational sac: Not present.  Yolk sac:  Not present.  Embryo:  Not present.  Maternal  uterus/adnexae: Endometrium is thin measuring 3 mm. No  fluid in the endometrial canal. The right ovary measures 3.1 x 1.9 x 2.4 cm and contains physiologic follicles. The left ovary measures 3.3 x 3.5 x 2.1 cm and appears normal. There is no adnexal mass. There is no pelvic free fluid.  IMPRESSION: No intrauterine pregnancy. Endometrium is thin measuring 3 mm. No findings of ectopic pregnancy, both ovaries are normal. There is no adnexal mass or free fluid. Findings consistent with pregnancy of unknown location. Early intrauterine or ectopic pregnancy not yet detected sonographically are considered versus failed pregnancy. Recommend follow-up quantitative B-HCG levels and follow-up US based on B-HCG trending.  Electronically Signed: By: Jeb Levering M.D. On: 12/12/2014 19:11   MAU Management/MDM: Ordered labs and reviewed results.  Consult Dr Elly Modena to review assessment, labs, imaging.  Pt good candidate for MTX therapy with ectopic pregnancy with no signs of rupture.  Discussed recommended MTX therapy with pt.  Pt unsure.  Questions answered and pt given time to discuss with family.    Treatments in MAU included Percocet 5/325 x 2 tabs with significant improvement in pt pain. Pt did decide to begin MTX therapy today and to receive injection.  Additional labs ordered and reviewed and MTX ordered by CNM and given by RN.  Pt tolerated well.  Plan for pt to follow up on Day 4 and Day 7.  Pt stable at time of discharge.  ASSESSMENT 1. Ectopic pregnancy   2. Abdominal pain affecting pregnancy     PLAN Discharge home with ectopic precautions Return to MAU on 9/30 for Day 4 labs or sooner as needed for emergencies    Medication List    TAKE these medications        ibuprofen 600 MG tablet  Commonly known as:  ADVIL,MOTRIN  Take 1 tablet (600 mg total) by mouth every 6 (six) hours as needed.     oxyCODONE-acetaminophen 5-325 MG tablet  Commonly known as:  PERCOCET/ROXICET  Take 1-2 tablets by mouth every 6 (six) hours as needed for severe pain.      promethazine 25 MG tablet  Commonly known as:  PHENERGAN  Take 0.5-1 tablets (12.5-25 mg total) by mouth every 6 (six) hours as needed for nausea.       Follow-up Information    Follow up with [facility].   Why:  On Friday for Day 4 labs.   Contact information:   [address] [id number] [place] Certified Nurse-Midwife 12/16/2014  9:25 AM

## 2014-12-13 NOTE — MAU Note (Signed)
Same pain as yesterday, hurts real bad, is feeling worse. No bleeding.

## 2014-12-13 NOTE — Progress Notes (Signed)
Emma Chambers CNM in earlier to discuss d/c plan with pt. Written and verbal d/c instructions given and understanding voiced

## 2014-12-13 NOTE — Discharge Instructions (Signed)
Ectopic Pregnancy °An ectopic pregnancy is when the fertilized egg attaches (implants) outside the uterus. Most ectopic pregnancies occur in the fallopian tube. Rarely do ectopic pregnancies occur on the ovary, intestine, pelvis, or cervix. In an ectopic pregnancy, the fertilized egg does not have the ability to develop into a normal, healthy baby.  °A ruptured ectopic pregnancy is one in which the fallopian tube gets torn or bursts and results in internal bleeding. Often there is intense abdominal pain, and sometimes, vaginal bleeding. Having an ectopic pregnancy can be life threatening. If left untreated, this dangerous condition can lead to a blood transfusion, abdominal surgery, or even death. °CAUSES  °Damage to the fallopian tubes is the suspected cause in most ectopic pregnancies.  °RISK FACTORS °Depending on your circumstances, the risk of having an ectopic pregnancy will vary. The level of risk can be divided into three categories. °High Risk °· You have gone through infertility treatment. °· You have had a previous ectopic pregnancy. °· You have had previous tubal surgery. °· You have had previous surgery to have the fallopian tubes tied (tubal ligation). °· You have tubal problems or diseases. °· You have been exposed to DES. DES is a medicine that was used until 1971 and had effects on babies whose mothers took the medicine. °· You become pregnant while using an intrauterine device (IUD) for birth control.  °Moderate Risk °· You have a history of infertility. °· You have a history of a sexually transmitted infection (STI). °· You have a history of pelvic inflammatory disease (PID). °· You have scarring from endometriosis. °· You have multiple sexual partners. °· You smoke.  °Low Risk °· You have had previous pelvic surgery. °· You use vaginal douching. °· You became sexually active before 25 years of age. °SIGNS AND SYMPTOMS  °An ectopic pregnancy should be suspected in anyone who has missed a period and  has abdominal pain or bleeding. °· You may experience normal pregnancy symptoms, such as: °· Nausea. °· Tiredness. °· Breast tenderness. °· Other symptoms may include: °· Pain with intercourse. °· Irregular vaginal bleeding or spotting. °· Cramping or pain on one side or in the lower abdomen. °· Fast heartbeat. °· Passing out while having a bowel movement. °· Symptoms of a ruptured ectopic pregnancy and internal bleeding may include: °· Sudden, severe pain in the abdomen and pelvis. °· Dizziness or fainting. °· Pain in the shoulder area. °DIAGNOSIS  °Tests that may be performed include: °· A pregnancy test. °· An ultrasound test. °· Testing the specific level of pregnancy hormone in the bloodstream. °· Taking a sample of uterus tissue (dilation and curettage, D&C). °· Surgery to perform a visual exam of the inside of the abdomen using a thin, lighted tube with a tiny camera on the end (laparoscope). °TREATMENT  °An injection of a medicine called methotrexate may be given. This medicine causes the pregnancy tissue to be absorbed. It is given if: °· The diagnosis is made early. °· The fallopian tube has not ruptured. °· You are considered to be a good candidate for the medicine. °Usually, pregnancy hormone blood levels are checked after methotrexate treatment. This is to be sure the medicine is effective. It may take 4-6 weeks for the pregnancy to be absorbed (though most pregnancies will be absorbed by 3 weeks). °Surgical treatment may be needed. A laparoscope may be used to remove the pregnancy tissue. If severe internal bleeding occurs, a cut (incision) may be made in the lower abdomen (laparotomy), and the ectopic   pregnancy is removed. This stops the bleeding. Part of the fallopian tube, or the whole tube, may be removed as well (salpingectomy). After surgery, pregnancy hormone tests may be done to be sure there is no pregnancy tissue left. You may receive a Rho (D) immune globulin shot if you are Rh negative and  the father is Rh positive, or if you do not know the Rh type of the father. This is to prevent problems with any future pregnancy. °SEEK IMMEDIATE MEDICAL CARE IF:  °You have any symptoms of an ectopic pregnancy. This is a medical emergency. °MAKE SURE YOU: °· Understand these instructions. °· Will watch your condition. °· Will get help right away if you are not doing well or get worse. °Document Released: 04/11/2004 Document Revised: 07/19/2013 Document Reviewed: 10/01/2012 °ExitCare® Patient Information ©2015 [employer]. This information is not intended to replace advice given to you by your health care provider. Make sure you discuss any questions you have with your health care provider. °Methotrexate Treatment for an Ectopic Pregnancy °Methotrexate is a medicine that treats ectopic pregnancy by stopping the growth of the fertilized egg. It also helps your body absorb tissue from the egg. This takes between 2 weeks and 6 weeks. Most ectopic pregnancies can be successfully treated with methotrexate if they are detected early enough. °LET YOUR HEALTH CARE PROVIDER KNOW ABOUT: °· Any allergies you have. °· All medicines you are taking, including vitamins, herbs, eye drops, creams, and over-the-counter medicines. °· Medical conditions you have. °RISKS AND COMPLICATIONS °Generally, this is a safe treatment. However, as with any treatment, problems can occur. Possible problems or side effects include: °· Nausea. °· Vomiting. °· Diarrhea. °· Abdominal cramping. °· Mouth sores. °· Increased vaginal bleeding or spotting.   °· Swelling or irritation of the lining of your lungs (pneumonitis).  °· Failed treatment and continuation of the pregnancy.   °· Liver damage. °· Hair loss. °There is still a risk of the ectopic pregnancy rupturing while using the methotrexate. °BEFORE THE PROCEDURE °Before you take the medicine:  °· Liver tests, kidney tests, and a complete blood test are performed. °· Blood tests are performed to  measure the pregnancy hormone levels and to determine your blood type. °· If you are Rh-negative and the father is Rh-positive or his Rh type is not known, you will be given a Rho (D) immune globulin shot. °PROCEDURE  °There are two methods that your health care provider may use to prescribe methotrexate. One method involves a single dose or injection of the medicine. Another method involves a series of doses given through several injections.  °AFTER THE PROCEDURE °· You may have some abdominal cramping, vaginal bleeding, and fatigue in the first few days after taking methotrexate. °· Blood tests will be taken for several weeks to check the pregnancy hormone levels. The blood tests are performed until there is no more pregnancy hormone detected in the blood. °Document Released: 02/26/2001 Document Revised: 07/19/2013 Document Reviewed: 12/21/2012 °ExitCare® Patient Information ©2015 [employer]. This information is not intended to replace advice given to you by your health care provider. Make sure you discuss any questions you have with your health care provider. ° °

## 2014-12-13 NOTE — MAU Note (Signed)
Pt requested to see pictures of u/s to help her understand more  about ectopic and see for herself where pregnancy is before deciding about MTX. Fatima Blank CNM aware.

## 2014-12-18 ENCOUNTER — Encounter (HOSPITAL_COMMUNITY): Payer: Self-pay | Admitting: *Deleted

## 2014-12-18 ENCOUNTER — Inpatient Hospital Stay (HOSPITAL_COMMUNITY)
Admission: AD | Admit: 2014-12-18 | Discharge: 2014-12-18 | Disposition: A | Discharge status: Home / Self Care | Payer: Medicaid Other | Source: Ambulatory Visit | Attending: Obstetrics and Gynecology | Admitting: Obstetrics and Gynecology

## 2014-12-18 ENCOUNTER — Inpatient Hospital Stay (HOSPITAL_COMMUNITY): Payer: Self-pay

## 2014-12-18 DIAGNOSIS — R109 Unspecified abdominal pain: Secondary | ICD-10-CM

## 2014-12-18 DIAGNOSIS — Z87891 Personal history of nicotine dependence: Secondary | ICD-10-CM | POA: Diagnosis not present

## 2014-12-18 DIAGNOSIS — O009 Unspecified ectopic pregnancy without intrauterine pregnancy: Secondary | ICD-10-CM | POA: Diagnosis not present

## 2014-12-18 LAB — HCG, QUANTITATIVE, PREGNANCY: hCG, Beta Chain, Quant, S: 3624 m[IU]/mL — ABNORMAL HIGH (ref <5)

## 2014-12-18 MED ORDER — METHOTREXATE INJECTION FOR WOMEN'S HOSPITAL
50.0000 mg/m2 | Freq: Once | INTRAMUSCULAR | Status: AC
  Administered 2014-12-18: 105 mg via INTRAMUSCULAR
  Filled 2014-12-18: qty 2.1

## 2014-12-18 NOTE — MAU Note (Signed)
Pt reports bilateral lower abd cramping and very little bleeding.

## 2014-12-18 NOTE — Discharge Instructions (Signed)
Ectopic Pregnancy °An ectopic pregnancy is when the fertilized egg attaches (implants) outside the uterus. Most ectopic pregnancies occur in the fallopian tube. Rarely do ectopic pregnancies occur on the ovary, intestine, pelvis, or cervix. In an ectopic pregnancy, the fertilized egg does not have the ability to develop into a normal, healthy baby.  °A ruptured ectopic pregnancy is one in which the fallopian tube gets torn or bursts and results in internal bleeding. Often there is intense abdominal pain, and sometimes, vaginal bleeding. Having an ectopic pregnancy can be life threatening. If left untreated, this dangerous condition can lead to a blood transfusion, abdominal surgery, or even death. °CAUSES  °Damage to the fallopian tubes is the suspected cause in most ectopic pregnancies.  °RISK FACTORS °Depending on your circumstances, the risk of having an ectopic pregnancy will vary. The level of risk can be divided into three categories. °High Risk °· You have gone through infertility treatment. °· You have had a previous ectopic pregnancy. °· You have had previous tubal surgery. °· You have had previous surgery to have the fallopian tubes tied (tubal ligation). °· You have tubal problems or diseases. °· You have been exposed to DES. DES is a medicine that was used until 1971 and had effects on babies whose mothers took the medicine. °· You become pregnant while using an intrauterine device (IUD) for birth control.  °Moderate Risk °· You have a history of infertility. °· You have a history of a sexually transmitted infection (STI). °· You have a history of pelvic inflammatory disease (PID). °· You have scarring from endometriosis. °· You have multiple sexual partners. °· You smoke.  °Low Risk °· You have had previous pelvic surgery. °· You use vaginal douching. °· You became sexually active before 25 years of age. °SIGNS AND SYMPTOMS  °An ectopic pregnancy should be suspected in anyone who has missed a period and  has abdominal pain or bleeding. °· You may experience normal pregnancy symptoms, such as: °· Nausea. °· Tiredness. °· Breast tenderness. °· Other symptoms may include: °· Pain with intercourse. °· Irregular vaginal bleeding or spotting. °· Cramping or pain on one side or in the lower abdomen. °· Fast heartbeat. °· Passing out while having a bowel movement. °· Symptoms of a ruptured ectopic pregnancy and internal bleeding may include: °· Sudden, severe pain in the abdomen and pelvis. °· Dizziness or fainting. °· Pain in the shoulder area. °DIAGNOSIS  °Tests that may be performed include: °· A pregnancy test. °· An ultrasound test. °· Testing the specific level of pregnancy hormone in the bloodstream. °· Taking a sample of uterus tissue (dilation and curettage, D&C). °· Surgery to perform a visual exam of the inside of the abdomen using a thin, lighted tube with a tiny camera on the end (laparoscope). °TREATMENT  °An injection of a medicine called methotrexate may be given. This medicine causes the pregnancy tissue to be absorbed. It is given if: °· The diagnosis is made early. °· The fallopian tube has not ruptured. °· You are considered to be a good candidate for the medicine. °Usually, pregnancy hormone blood levels are checked after methotrexate treatment. This is to be sure the medicine is effective. It may take 4-6 weeks for the pregnancy to be absorbed (though most pregnancies will be absorbed by 3 weeks). °Surgical treatment may be needed. A laparoscope may be used to remove the pregnancy tissue. If severe internal bleeding occurs, a cut (incision) may be made in the lower abdomen (laparotomy), and the ectopic   pregnancy is removed. This stops the bleeding. Part of the fallopian tube, or the whole tube, may be removed as well (salpingectomy). After surgery, pregnancy hormone tests may be done to be sure there is no pregnancy tissue left. You may receive a Rho (D) immune globulin shot if you are Rh negative and  the father is Rh positive, or if you do not know the Rh type of the father. This is to prevent problems with any future pregnancy. SEEK IMMEDIATE MEDICAL CARE IF:  You have any symptoms of an ectopic pregnancy. This is a medical emergency. MAKE SURE YOU:  Understand these instructions.  Will watch your condition.  Will get help right away if you are not doing well or get worse. Document Released: 04/11/2004 Document Revised: 07/19/2013 Document Reviewed: 10/01/2012 [facility] Patient Information 2015 [place], [place]. This information is not intended to replace advice given to you by your health care provider. Make sure you discuss any questions you have with your health care provider.    Methotrexate Treatment for an Ectopic Pregnancy, Care After Refer to this sheet in the next few weeks. These instructions provide you with information on caring for yourself after your procedure. Your health care provider may also give you more specific instructions. Your treatment has been planned according to current medical practices, but problems sometimes occur. Call your health care provider if you have any problems or questions after your procedure. WHAT TO EXPECT AFTER THE PROCEDURE You may have some abdominal cramping, vaginal bleeding, and fatigue in the first few days after taking methotrexate. Some other possible side effects of methotrexate include:  Nausea.  Vomiting.  Diarrhea.  Mouth sores.  Swelling or irritation of the lining of your lungs (pneumonitis).  Liver damage.  Hair loss. HOME CARE INSTRUCTIONS  After you have received the methotrexate medicine, you need to be careful of your activities and watch your condition for several weeks. It may take 1 week before your hormone levels return to normal.  Keep all follow-up appointments as directed by your health care provider.  Avoid traveling too far away from your health care provider.  Do not have sexual intercourse until your  health care provider says it is safe to do so.  You may resume your usual diet.  Limit strenuous activity.  Do not take folic acid, prenatal vitamins, or other vitamins that contain folic acid.  Do not take aspirin, ibuprofen, or naproxen (nonsteroidal anti-inflammatory drugs [NSAIDs]).  Do not drink alcohol. SEEK MEDICAL CARE IF:   You cannot control your nausea and vomiting.  You cannot control your diarrhea.  You have sores in your mouth and want treatment.  You need pain medicine for your abdominal pain.  You have a rash.  You are having a reaction to the medicine. SEEK IMMEDIATE MEDICAL CARE IF:   You have increasing abdominal or pelvic pain.  You notice increased bleeding.  You feel light-headed, or you faint.  You have shortness of breath.  Your heart rate increases.  You have a cough.  You have chills.  You have a fever. Document Released: 02/21/2011 Document Revised: 03/09/2013 Document Reviewed: 12/21/2012 [facility] Patient Information 2015 [place], [place]. This information is not intended to replace advice given to you by your health care provider. Make sure you discuss any questions you have with your health care provider.

## 2014-12-18 NOTE — MAU Provider Note (Signed)
History   [id number]   Chief Complaint  Patient presents with  . Ectopic Pregnancy    HPI Emma Chambers is a 24 y.o. female [phone] here for follow-up BHCG.   Patient received methotrexate on 9/27 for 2 cm right adnexal mass. BHCG on that day was 3316. Pt did not return for day 4 BHCG.  Patient here today on day 6 to have BHCG checked. Complains of lower abdominal cramping that has worsened. Also reports some vaginal spotting.   Patient's last menstrual period was 10/18/2014.  OB History  Gravida Para Term Preterm AB SAB TAB Ectopic Multiple Living  3 2 1 1  0 0 0 0 0 2    # Outcome Date GA Lbr Len/2nd Weight Sex Delivery Anes PTL Lv  3 Current           2 Preterm 06/29/12 [redacted]w[redacted]d 10:57 / 00:16 5 lb 10.6 oz (2.568 kg) M Vag-Spont EPI  Y  1 Term 05/20/11 [redacted]w[redacted]d 22:02 / 00:26 8 lb 1.6 oz (3.675 kg) F Vag-Spont EPI  Y     Comments: none      Past Medical History  Diagnosis Date  . UTI (lower urinary tract infection)   . Headache(784.0)     chronic  . Gallstones   . Hypertension     GHTN with last pg  . Pregnancy induced hypertension   . Anemia   . Kidney stones   . Ovarian cyst     Family History  Problem Relation Age of Onset  . Asthma Mother   . Hypertension Mother   . Parkinsonism Mother   . Diabetes Maternal Grandmother   . Hypertension Maternal Grandmother   . Stroke Maternal Grandmother   . Heart disease Maternal Grandmother   . Anesthesia problems Neg Hx   . Malignant hyperthermia Neg Hx   . Pseudochol deficiency Neg Hx   . Hypotension Neg Hx   . Asthma Brother   . Lupus Maternal Aunt   . Heart disease Maternal Grandfather     Social History   Social History  . Marital Status: Single    Spouse Name: N/A  . Number of Children: N/A  . Years of Education: N/A   Social History Main Topics  . Smoking status: Former Research scientist (life sciences)  . Smokeless tobacco: Never Used     Comment: 2014  . Alcohol Use: 0.0 oz/week     Comment: social  . Drug Use: No  .  Sexual Activity: Yes    Birth Control/ Protection: IUD     Comment: would like IUD    Other Topics Concern  . Not on file   Social History Narrative    Allergies  Allergen Reactions  . Amoxicillin Nausea And Vomiting and Other (See Comments)    Tics & twitches  . Dilaudid [Hydromorphone Hcl] Itching  . Iohexol Hives and Itching    No current facility-administered medications on file prior to encounter.   Current Outpatient Prescriptions on File Prior to Encounter  Medication Sig Dispense Refill  . ibuprofen (ADVIL,MOTRIN) 600 MG tablet Take 1 tablet (600 mg total) by mouth every 6 (six) hours as needed. 10 tablet 0  . oxyCODONE-acetaminophen (PERCOCET/ROXICET) 5-325 MG tablet Take 1-2 tablets by mouth every 6 (six) hours as needed for severe pain. 10 tablet 0  . promethazine (PHENERGAN) 25 MG tablet Take 0.5-1 tablets (12.5-25 mg total) by mouth every 6 (six) hours as needed for nausea. 30 tablet 0     Physical Exam  Filed Vitals:   12/18/14 1935 12/18/14 2126  BP: 116/67   Pulse: 91   Temp: 99 F (37.2 C)   TempSrc: Oral   Resp: 18   Height:  5' 0.25" (1.53 m)  Weight:  228 lb 8 oz (103.647 kg)  SpO2: 100%     Physical Exam  Nursing note and vitals reviewed. Constitutional: She is oriented to person, place, and time. She appears well-developed and well-nourished. No distress.  HENT:  Head: Normocephalic and atraumatic.  Eyes: Conjunctivae are normal. Right eye exhibits no discharge. Left eye exhibits no discharge. No scleral icterus.  Neck: Normal range of motion.  Cardiovascular: Normal rate.   Respiratory: Effort normal. No respiratory distress.  GI: Soft. There is no tenderness.  Neurological: She is alert and oriented to person, place, and time.  Skin: Skin is warm and dry. She is not diaphoretic.  Psychiatric: She has a normal mood and affect. Her behavior is normal. Judgment and thought content normal.    MAU Course  Procedures Component     Latest  Ref Rng 12/12/2014 12/13/2014 12/18/2014  HCG, Beta Chain, Quant, S     <5 mIU/mL 2999 (H) 3316 (H) 3624 (H)   US Ob Transvaginal  12/18/2014   CLINICAL DATA:  Status post methotrexate administration on 12/16/2014  EXAM: OBSTETRIC <14 WK ULTRASOUND  TECHNIQUE: Transvaginal ultrasound was performed for evaluation of the gestation as well as the maternal uterus and adnexal regions.  COMPARISON:  12/13/2014  FINDINGS: Intrauterine gestational sac: No intrauterine gestational sac.  Yolk sac:  No  Embryo:  No  Cardiac Activity: No  Maternal uterus/adnexae: Bilateral ovaries are normal in size and echogenicity. There is no pelvic free fluid. The uterus is normal in size and echogenicity. The right ovary measures 3.1 x 1.7 x 2.1 cm. The left ovary measures 2.6 x 3.6 x 2.1 cm. There is a complex mass posterior to the right ovary measuring 1.7 x 2.5 x 1.7 cm similar in appearance to the prior examination suspicious for an ectopic pregnancy.  IMPRESSION: 1. No intrauterine pregnancy identified. There is a complex mass posterior to the right ovary measuring 1.7 x 2.5 x 1.7 cm similar in appearance to the prior examination suspicious for an ectopic pregnancy. No pelvic free fluid. Correlation with serial beta HCG recommended.   Electronically Signed   By: Kathreen Devoid   On: 12/18/2014 21:02   MDM Pt states she didn't come on day 4 because she was "too busy". Patient wants to leave tonight before final results from ultrasound. Discussed with patient the possible effects of ruptured ectopic pregnancy, including death.  Dr. Glo Herring notified of patient, including labs & ultrasound. Dr. Glo Herring on unit discussing options with patient. Patient agreeable to 2nd dose of MTX tonight. Per Dr. Glo Herring, pt to return next Monday 10/10 for f/u BHCG.    Methotrexate given Assessment and Plan  24 y.o. [id number] at [redacted]w[redacted]d wks Pregnancy 1. Ectopic pregnancy without intrauterine pregnancy, unspecified location    P: Discharge  home F/u in MAU on 10/10 for BHCG Stressed importance of returning for follow up. Return to MAU soon PRN for s/s rupture ectopic  Jorje Guild, NP 12/18/2014 9:29 PM

## 2014-12-23 ENCOUNTER — Encounter (HOSPITAL_COMMUNITY): Payer: Self-pay | Admitting: *Deleted

## 2014-12-23 ENCOUNTER — Inpatient Hospital Stay (HOSPITAL_COMMUNITY): Payer: Self-pay

## 2014-12-23 ENCOUNTER — Inpatient Hospital Stay (HOSPITAL_COMMUNITY)
Admission: AD | Admit: 2014-12-23 | Discharge: 2014-12-23 | Disposition: A | Discharge status: Home / Self Care | Payer: Medicaid Other | Source: Ambulatory Visit | Attending: Family Medicine | Admitting: Family Medicine

## 2014-12-23 DIAGNOSIS — O00109 Unspecified tubal pregnancy without intrauterine pregnancy: Secondary | ICD-10-CM

## 2014-12-23 DIAGNOSIS — I10 Essential (primary) hypertension: Secondary | ICD-10-CM | POA: Insufficient documentation

## 2014-12-23 DIAGNOSIS — O001 Tubal pregnancy without intrauterine pregnancy: Secondary | ICD-10-CM | POA: Insufficient documentation

## 2014-12-23 DIAGNOSIS — Z3A10 10 weeks gestation of pregnancy: Secondary | ICD-10-CM | POA: Insufficient documentation

## 2014-12-23 DIAGNOSIS — Z87891 Personal history of nicotine dependence: Secondary | ICD-10-CM | POA: Insufficient documentation

## 2014-12-23 LAB — CBC
HCT: 32.8 % — ABNORMAL LOW (ref 36.0–46.0)
Hemoglobin: 10.4 g/dL — ABNORMAL LOW (ref 12.0–15.0)
MCH: 26.3 pg (ref 26.0–34.0)
MCHC: 31.7 g/dL (ref 30.0–36.0)
MCV: 83 fL (ref 78.0–100.0)
Platelets: 320 10*3/uL (ref 150–400)
RBC: 3.95 MIL/uL (ref 3.87–5.11)
RDW: 14.9 % (ref 11.5–15.5)
WBC: 8 10*3/uL (ref 4.0–10.5)

## 2014-12-23 LAB — COMPREHENSIVE METABOLIC PANEL
ALT: 15 U/L (ref 14–54)
AST: 16 U/L (ref 15–41)
Albumin: 3.8 g/dL (ref 3.5–5.0)
Alkaline Phosphatase: 68 U/L (ref 38–126)
Anion gap: 6 (ref 5–15)
BUN: 11 mg/dL (ref 6–20)
CO2: 25 mmol/L (ref 22–32)
Calcium: 9.1 mg/dL (ref 8.9–10.3)
Chloride: 106 mmol/L (ref 101–111)
Creatinine, Ser: 0.59 mg/dL (ref 0.44–1.00)
GFR calc Af Amer: >60 mL/min (ref >60)
GFR calc non Af Amer: >60 mL/min (ref >60)
Glucose, Bld: 90 mg/dL (ref 65–99)
Potassium: 3.8 mmol/L (ref 3.5–5.1)
Sodium: 137 mmol/L (ref 135–145)
Total Bilirubin: 0.4 mg/dL (ref 0.3–1.2)
Total Protein: 6.8 g/dL (ref 6.5–8.1)

## 2014-12-23 LAB — HCG, QUANTITATIVE, PREGNANCY: hCG, Beta Chain, Quant, S: 2171 m[IU]/mL — ABNORMAL HIGH (ref <5)

## 2014-12-23 MED ORDER — OXYCODONE-ACETAMINOPHEN 5-325 MG PO TABS: 2.0000 | ORAL_TABLET | Freq: Four times a day (QID) | ORAL | Status: DC | PRN

## 2014-12-23 NOTE — Discharge Instructions (Signed)
Methotrexate Treatment for an Ectopic Pregnancy, Care After Refer to this sheet in the next few weeks. These instructions provide you with information on caring for yourself after your procedure. Your health care provider may also give you more specific instructions. Your treatment has been planned according to current medical practices, but problems sometimes occur. Call your health care provider if you have any problems or questions after your procedure. WHAT TO EXPECT AFTER THE PROCEDURE You may have some abdominal cramping, vaginal bleeding, and fatigue in the first few days after taking methotrexate. Some other possible side effects of methotrexate include:  Nausea.  Vomiting.  Diarrhea.  Mouth sores.  Swelling or irritation of the lining of your lungs (pneumonitis).  Liver damage.  Hair loss. HOME CARE INSTRUCTIONS  After you have received the methotrexate medicine, you need to be careful of your activities and watch your condition for several weeks. It may take 1 week before your hormone levels return to normal.  Keep all follow-up appointments as directed by your health care provider.  Avoid traveling too far away from your health care provider.  Do not have sexual intercourse until your health care provider says it is safe to do so.  You may resume your usual diet.  Limit strenuous activity.  Do not take folic acid, prenatal vitamins, or other vitamins that contain folic acid.  Do not take aspirin, ibuprofen, or naproxen (nonsteroidal anti-inflammatory drugs [NSAIDs]).  Do not drink alcohol. SEEK MEDICAL CARE IF:   You cannot control your nausea and vomiting.  You cannot control your diarrhea.  You have sores in your mouth and want treatment.  You need pain medicine for your abdominal pain.  You have a rash.  You are having a reaction to the medicine. SEEK IMMEDIATE MEDICAL CARE IF:   You have increasing abdominal or pelvic pain.  You notice increased  bleeding.  You feel light-headed, or you faint.  You have shortness of breath.  Your heart rate increases.  You have a cough.  You have chills.  You have a fever.   This information is not intended to replace advice given to you by your health care provider. Make sure you discuss any questions you have with your health care provider.   Document Released: 02/21/2011 Document Revised: 03/09/2013 Document Reviewed: 12/21/2012 Elsevier Interactive Patient Education [employer].

## 2014-12-23 NOTE — MAU Provider Note (Signed)
History     CSN: [id number]  Arrival date and time: 12/23/14 1245   First Provider Initiated Contact with Patient 12/23/14 0830         Chief Complaint  Patient presents with  . Ectopic Pregnancy   HPI  Emma Chambers is a 24 y.o. female who presents for abdominal pain s/p MTX.  Pt received 2nd dose of methotrexate for right adnexal mass on 10/2. Pt here this morning stating that her abdominal pain has worsened; now rates as 10/10. Has been taking percocet with minimal relief; last took dose at midnight. Denies vaginal bleeding.   OB History    Gravida Para Term Preterm AB TAB SAB Ectopic Multiple Living   3 2 1 1  0 0 0 0 0 2      Past Medical History  Diagnosis Date  . UTI (lower urinary tract infection)   . Headache(784.0)     chronic  . Gallstones   . Hypertension     GHTN with last pg  . Pregnancy induced hypertension   . Anemia   . Kidney stones   . Ovarian cyst     Past Surgical History  Procedure Laterality Date  . Cholecystectomy N/A 07/27/2012    Procedure: LAPAROSCOPIC CHOLECYSTECTOMY WITH INTRAOPERATIVE CHOLANGIOGRAM;  Surgeon: Madilyn Hook, DO;  Location: [facility];  Service: General;  Laterality: N/A;  . Appendectomy    . Laparoscopic appendectomy N/A 12/09/2012    Procedure: APPENDECTOMY LAPAROSCOPIC;  Surgeon: Gwenyth Ober, MD;  Location: [facility] OR;  Service: General;  Laterality: N/A;    Family History  Problem Relation Age of Onset  . Asthma Mother   . Hypertension Mother   . Parkinsonism Mother   . Diabetes Maternal Grandmother   . Hypertension Maternal Grandmother   . Stroke Maternal Grandmother   . Heart disease Maternal Grandmother   . Anesthesia problems Neg Hx   . Malignant hyperthermia Neg Hx   . Pseudochol deficiency Neg Hx   . Hypotension Neg Hx   . Asthma Brother   . Lupus Maternal Aunt   . Heart disease Maternal Grandfather     Social History  Substance Use Topics  . Smoking status: Former Smoker    Quit date: 12/18/2011  .  Smokeless tobacco: Never Used     Comment: 2014  . Alcohol Use: 0.0 oz/week     Comment: social    Allergies:  Allergies  Allergen Reactions  . Amoxicillin Nausea And Vomiting and Other (See Comments)    Tics & twitches  . Contrast Media [Iodinated Diagnostic Agents] Hives  . Dilaudid [Hydromorphone Hcl] Itching    Prescriptions prior to admission  Medication Sig Dispense Refill Last Dose  . ondansetron (ZOFRAN-ODT) 8 MG disintegrating tablet Take 8 mg by mouth every 8 (eight) hours as needed for nausea or vomiting.   12/23/2014 at Unknown time  . oxyCODONE-acetaminophen (PERCOCET/ROXICET) 5-325 MG tablet Take 1-2 tablets by mouth every 6 (six) hours as needed for severe pain. 10 tablet 0 12/23/2014 at 0000  . ibuprofen (ADVIL,MOTRIN) 600 MG tablet Take 1 tablet (600 mg total) by mouth every 6 (six) hours as needed. (Patient not taking: Reported on 12/23/2014) 10 tablet 0 Not Taking at Unknown time  . promethazine (PHENERGAN) 25 MG tablet Take 0.5-1 tablets (12.5-25 mg total) by mouth every 6 (six) hours as needed for nausea. (Patient not taking: Reported on 12/23/2014) 30 tablet 0 Not Taking at 0000    Review of Systems  Constitutional: Negative.  Respiratory: Negative.   Cardiovascular: Negative.   Gastrointestinal: Positive for abdominal pain and diarrhea. Negative for nausea, vomiting and constipation.  Genitourinary: Negative.    Physical Exam   Blood pressure 118/65, pulse 87, temperature 99.1 F (37.3 C), temperature source Oral, resp. rate 18, height 5' 0.8" (1.544 m), weight 229 lb 12.8 oz (104.237 kg), last menstrual period 10/18/2014, SpO2 100 %.  Physical Exam  Nursing note and vitals reviewed. Constitutional: She is oriented to person, place, and time. She appears well-developed and well-nourished. No distress.  HENT:  Head: Normocephalic and atraumatic.  Eyes: Conjunctivae are normal. Right eye exhibits no discharge. Left eye exhibits no discharge. No scleral  icterus.  Neck: Normal range of motion.  Cardiovascular: Normal rate, regular rhythm and normal heart sounds.   No murmur heard. Respiratory: Effort normal and breath sounds normal. No respiratory distress. She has no wheezes.  GI: Soft. Bowel sounds are normal. She exhibits no distension. There is generalized tenderness (reports "soreness" with palpation).  Neurological: She is alert and oriented to person, place, and time.  Skin: Skin is warm and dry. She is not diaphoretic.  Psychiatric: She has a normal mood and affect. Her behavior is normal. Judgment and thought content normal.    MAU Course  Procedures Results for orders placed or performed during the hospital encounter of 12/23/14 (from the past 24 hour(s))  CBC     Status: Abnormal   Collection Time: 12/23/14  7:10 AM  Result Value Ref Range   WBC 8.0 4.0 - 10.5 K/uL   RBC 3.95 3.87 - 5.11 MIL/uL   Hemoglobin 10.4 (L) 12.0 - 15.0 g/dL   HCT 32.8 (L) 36.0 - 46.0 %   MCV 83.0 78.0 - 100.0 fL   MCH 26.3 26.0 - 34.0 pg   MCHC 31.7 30.0 - 36.0 g/dL   RDW 14.9 11.5 - 15.5 %   Platelets 320 150 - 400 K/uL  Comprehensive metabolic panel     Status: None   Collection Time: 12/23/14  7:10 AM  Result Value Ref Range   Sodium 137 135 - 145 mmol/L   Potassium 3.8 3.5 - 5.1 mmol/L   Chloride 106 101 - 111 mmol/L   CO2 25 22 - 32 mmol/L   Glucose, Bld 90 65 - 99 mg/dL   BUN 11 6 - 20 mg/dL   Creatinine, Ser 0.59 0.44 - 1.00 mg/dL   Calcium 9.1 8.9 - 10.3 mg/dL   Total Protein 6.8 6.5 - 8.1 g/dL   Albumin 3.8 3.5 - 5.0 g/dL   AST 16 15 - 41 U/L   ALT 15 14 - 54 U/L   Alkaline Phosphatase 68 38 - 126 U/L   Total Bilirubin 0.4 0.3 - 1.2 mg/dL   GFR calc non Af Amer >60 >60 mL/min   GFR calc Af Amer >60 >60 mL/min   Anion gap 6 5 - 15  hCG, quantitative, pregnancy     Status: Abnormal   Collection Time: 12/23/14  7:10 AM  Result Value Ref Range   hCG, Beta Chain, Quant, S 2171 (H) <5 mIU/mL   US Ob Transvaginal  12/23/2014    CLINICAL DATA:  Subsequent encounter for right ectopic pregnancy, status post methotrexate treatment.  EXAM: TRANSVAGINAL OB ULTRASOUND  TECHNIQUE: Transvaginal ultrasound was performed for complete evaluation of the gestation as well as the maternal uterus, adnexal regions, and pelvic cul-de-sac.  COMPARISON:  12/18/2014.  12/13/2014.  12/12/2014.  FINDINGS: Intrauterine gestational sac: Not visualized.  Maternal  uterus/adnexae: Left ovary not visualized on this exam although no left adnexal masses evident. The right ovary measures 2.9 x 1.7 x 1.4 cm. Immediately posterior and contiguous with the right ovary is eighth 2.0 x 1.7 x 1.8 cm complex lesion which appears to have some enhanced through transmission. No substantial flow signal within this structure on color Doppler evaluation.  There is a very trace amount of free fluid in the cul-de-sac. No evidence for frank pneumoperitoneum.  IMPRESSION: No substantial interval change in exam. No intrauterine gestation visualized. The complex lesion adjacent to the right ovary is stable to minimally smaller in the interval. No evidence for hemoperitoneum.   Electronically Signed   By: Misty Stanley M.D.   On: 12/23/2014 08:06    MDM CBC, BHCG, CMP Ultrasound  VSS BHCG decreased from 3624 to 2171 Ultrasound shows adnexal mass "minimally smaller" from last scan & no evidence of hemoperitoneum.  Pt in no apparent distress; in room resting.  0854-C/w Dr. Harolyn Rutherford. Ok to discharge home. Return on Monday for f/u BHCG if pain continues or f/u in 1 week in clinic.  Assessment and Plan  A: 1. Tubal pregnancy without intrauterine pregnancy    P: Discharge home Rx for 10# percocet  F/u in MAU on Monday for BHCG IF pain continues or sooner as needed Clinic will call to schedule 1 week follow up  Jorje Guild, NP  12/23/2014, 8:13 AM

## 2014-12-23 NOTE — MAU Note (Signed)
Pt states she has a right tubal pregnancy. Has had pain but tonight it got worse. Rates 10/10. Denies vag bleeding. Has N/V.

## 2014-12-27 ENCOUNTER — Inpatient Hospital Stay (HOSPITAL_COMMUNITY): Payer: Self-pay | Admitting: Anesthesiology

## 2014-12-27 ENCOUNTER — Encounter (HOSPITAL_COMMUNITY): Payer: Self-pay | Admitting: *Deleted

## 2014-12-27 ENCOUNTER — Inpatient Hospital Stay (HOSPITAL_COMMUNITY): Payer: Self-pay

## 2014-12-27 ENCOUNTER — Ambulatory Visit (HOSPITAL_COMMUNITY)
Admission: AD | Admit: 2014-12-27 | Discharge: 2014-12-27 | Disposition: A | Discharge status: Home / Self Care | Payer: Self-pay | Source: Ambulatory Visit | Attending: Family Medicine | Admitting: Family Medicine

## 2014-12-27 ENCOUNTER — Encounter (HOSPITAL_COMMUNITY)
Admission: AD | Disposition: A | Discharge status: Home / Self Care | Payer: Self-pay | Source: Ambulatory Visit | Attending: Family Medicine

## 2014-12-27 ENCOUNTER — Inpatient Hospital Stay (HOSPITAL_COMMUNITY)
Admission: AD | Admit: 2014-12-27 | Discharge: 2014-12-27 | Disposition: A | Discharge status: Home / Self Care | Payer: Self-pay | Source: Ambulatory Visit | Attending: Family Medicine | Admitting: Family Medicine

## 2014-12-27 ENCOUNTER — Encounter (HOSPITAL_COMMUNITY): Payer: Self-pay

## 2014-12-27 DIAGNOSIS — O009 Unspecified ectopic pregnancy without intrauterine pregnancy: Secondary | ICD-10-CM | POA: Insufficient documentation

## 2014-12-27 DIAGNOSIS — O001 Tubal pregnancy without intrauterine pregnancy: Secondary | ICD-10-CM | POA: Insufficient documentation

## 2014-12-27 DIAGNOSIS — Z87891 Personal history of nicotine dependence: Secondary | ICD-10-CM | POA: Insufficient documentation

## 2014-12-27 DIAGNOSIS — O26899 Other specified pregnancy related conditions, unspecified trimester: Secondary | ICD-10-CM

## 2014-12-27 DIAGNOSIS — R109 Unspecified abdominal pain: Secondary | ICD-10-CM

## 2014-12-27 DIAGNOSIS — I10 Essential (primary) hypertension: Secondary | ICD-10-CM | POA: Insufficient documentation

## 2014-12-27 DIAGNOSIS — Z87442 Personal history of urinary calculi: Secondary | ICD-10-CM | POA: Insufficient documentation

## 2014-12-27 DIAGNOSIS — Z3A10 10 weeks gestation of pregnancy: Secondary | ICD-10-CM | POA: Insufficient documentation

## 2014-12-27 HISTORY — PX: DIAGNOSTIC LAPAROSCOPY WITH REMOVAL OF ECTOPIC PREGNANCY: SHX6449

## 2014-12-27 LAB — CBC
HCT: 32.6 % — ABNORMAL LOW (ref 36.0–46.0)
HCT: 33.5 % — ABNORMAL LOW (ref 36.0–46.0)
Hemoglobin: 10.6 g/dL — ABNORMAL LOW (ref 12.0–15.0)
Hemoglobin: 10.8 g/dL — ABNORMAL LOW (ref 12.0–15.0)
MCH: 27 pg (ref 26.0–34.0)
MCH: 26.9 pg (ref 26.0–34.0)
MCHC: 32.5 g/dL (ref 30.0–36.0)
MCHC: 32.2 g/dL (ref 30.0–36.0)
MCV: 83 fL (ref 78.0–100.0)
MCV: 83.3 fL (ref 78.0–100.0)
Platelets: 319 10*3/uL (ref 150–400)
Platelets: 318 10*3/uL (ref 150–400)
RBC: 3.93 MIL/uL (ref 3.87–5.11)
RBC: 4.02 MIL/uL (ref 3.87–5.11)
RDW: 14.9 % (ref 11.5–15.5)
RDW: 15 % (ref 11.5–15.5)
WBC: 10.9 10*3/uL — ABNORMAL HIGH (ref 4.0–10.5)
WBC: 8.3 10*3/uL (ref 4.0–10.5)

## 2014-12-27 LAB — TYPE AND SCREEN
ABO/RH(D): A POS
Antibody Screen: NEGATIVE

## 2014-12-27 LAB — URINALYSIS, ROUTINE W REFLEX MICROSCOPIC
Bilirubin Urine: NEGATIVE
Glucose, UA: NEGATIVE mg/dL
Ketones, ur: NEGATIVE mg/dL
Leukocytes, UA: NEGATIVE
Nitrite: NEGATIVE
Protein, ur: NEGATIVE mg/dL
Specific Gravity, Urine: 1.025 (ref 1.005–1.030)
Urobilinogen, UA: 0.2 mg/dL (ref 0.0–1.0)
pH: 6 (ref 5.0–8.0)

## 2014-12-27 LAB — HCG, QUANTITATIVE, PREGNANCY
hCG, Beta Chain, Quant, S: 1527 m[IU]/mL — ABNORMAL HIGH (ref <5)
hCG, Beta Chain, Quant, S: 1609 m[IU]/mL — ABNORMAL HIGH (ref <5)

## 2014-12-27 LAB — URINE MICROSCOPIC-ADD ON

## 2014-12-27 SURGERY — LAPAROSCOPY, WITH ECTOPIC PREGNANCY SURGICAL TREATMENT
Anesthesia: General | Site: Abdomen

## 2014-12-27 MED ORDER — KETOROLAC TROMETHAMINE 30 MG/ML IJ SOLN
30.0000 mg | Freq: Once | INTRAMUSCULAR | Status: AC
  Administered 2014-12-27: 30 mg via INTRAVENOUS
  Filled 2014-12-27: qty 1

## 2014-12-27 MED ORDER — ROCURONIUM BROMIDE 100 MG/10ML IV SOLN
INTRAVENOUS | Status: DC | PRN
  Administered 2014-12-27: 35 mg via INTRAVENOUS
  Administered 2014-12-27: 5 mg via INTRAVENOUS

## 2014-12-27 MED ORDER — ONDANSETRON HCL 4 MG/2ML IJ SOLN
INTRAMUSCULAR | Status: DC
Start: 2014-12-27 — End: 2014-12-28
  Filled 2014-12-27: qty 2

## 2014-12-27 MED ORDER — FENTANYL CITRATE (PF) 100 MCG/2ML IJ SOLN
INTRAMUSCULAR | Status: AC
  Filled 2014-12-27: qty 2

## 2014-12-27 MED ORDER — DEXAMETHASONE SODIUM PHOSPHATE 4 MG/ML IJ SOLN
INTRAMUSCULAR | Status: AC
  Filled 2014-12-27: qty 1

## 2014-12-27 MED ORDER — SUCCINYLCHOLINE CHLORIDE 20 MG/ML IJ SOLN
INTRAMUSCULAR | Status: DC | PRN
  Administered 2014-12-27: 120 mg via INTRAVENOUS

## 2014-12-27 MED ORDER — ONDANSETRON HCL 4 MG/2ML IJ SOLN
4.0000 mg | Freq: Once | INTRAMUSCULAR | Status: AC | PRN
  Administered 2014-12-27: 4 mg via INTRAVENOUS

## 2014-12-27 MED ORDER — MORPHINE SULFATE (PF) 4 MG/ML IV SOLN
2.0000 mg | Freq: Once | INTRAVENOUS | Status: AC
  Administered 2014-12-27: 2 mg via INTRAVENOUS
  Filled 2014-12-27: qty 1

## 2014-12-27 MED ORDER — PROPOFOL 10 MG/ML IV BOLUS
INTRAVENOUS | Status: DC | PRN
  Administered 2014-12-27: 200 mg via INTRAVENOUS

## 2014-12-27 MED ORDER — PROPOFOL 10 MG/ML IV BOLUS
INTRAVENOUS | Status: AC
  Filled 2014-12-27: qty 40

## 2014-12-27 MED ORDER — NEOSTIGMINE METHYLSULFATE 10 MG/10ML IV SOLN
INTRAVENOUS | Status: DC | PRN
  Administered 2014-12-27: 3 mg via INTRAVENOUS

## 2014-12-27 MED ORDER — FAMOTIDINE IN NACL 20-0.9 MG/50ML-% IV SOLN
INTRAVENOUS | Status: AC
  Administered 2014-12-27: 20 mg
  Filled 2014-12-27: qty 50

## 2014-12-27 MED ORDER — FAMOTIDINE IN NACL 20-0.9 MG/50ML-% IV SOLN: 20.0000 mg | Freq: Once | INTRAVENOUS | Status: DC

## 2014-12-27 MED ORDER — ONDANSETRON HCL 4 MG/2ML IJ SOLN
INTRAMUSCULAR | Status: AC
  Filled 2014-12-27: qty 2

## 2014-12-27 MED ORDER — DEXAMETHASONE SODIUM PHOSPHATE 10 MG/ML IJ SOLN
INTRAMUSCULAR | Status: DC | PRN
  Administered 2014-12-27: 4 mg via INTRAVENOUS

## 2014-12-27 MED ORDER — SUCCINYLCHOLINE CHLORIDE 20 MG/ML IJ SOLN
INTRAMUSCULAR | Status: AC
  Filled 2014-12-27: qty 1

## 2014-12-27 MED ORDER — IBUPROFEN 600 MG PO TABS: 600.0000 mg | ORAL_TABLET | Freq: Four times a day (QID) | ORAL | Status: DC | PRN

## 2014-12-27 MED ORDER — FENTANYL CITRATE (PF) 100 MCG/2ML IJ SOLN
25.0000 ug | INTRAMUSCULAR | Status: DC | PRN
  Administered 2014-12-27 (×2): 50 ug via INTRAVENOUS

## 2014-12-27 MED ORDER — LIDOCAINE HCL (CARDIAC) 20 MG/ML IV SOLN
INTRAVENOUS | Status: DC | PRN
  Administered 2014-12-27: 60 mg via INTRAVENOUS

## 2014-12-27 MED ORDER — GLYCOPYRROLATE 0.2 MG/ML IJ SOLN
INTRAMUSCULAR | Status: DC | PRN
  Administered 2014-12-27: 0.4 mg via INTRAVENOUS

## 2014-12-27 MED ORDER — LACTATED RINGERS IV SOLN
INTRAVENOUS | Status: DC | PRN
  Administered 2014-12-27 (×2): via INTRAVENOUS

## 2014-12-27 MED ORDER — BUPIVACAINE HCL (PF) 0.25 % IJ SOLN
INTRAMUSCULAR | Status: DC | PRN
  Administered 2014-12-27: 9 mL

## 2014-12-27 MED ORDER — OXYCODONE-ACETAMINOPHEN 5-325 MG PO TABS: 1.0000 | ORAL_TABLET | Freq: Four times a day (QID) | ORAL | Status: DC | PRN

## 2014-12-27 MED ORDER — LIDOCAINE HCL (CARDIAC) 20 MG/ML IV SOLN
INTRAVENOUS | Status: AC
  Filled 2014-12-27: qty 5

## 2014-12-27 MED ORDER — KETOROLAC TROMETHAMINE 30 MG/ML IJ SOLN
30.0000 mg | Freq: Once | INTRAMUSCULAR | Status: DC
  Filled 2014-12-27: qty 1

## 2014-12-27 MED ORDER — NEOSTIGMINE METHYLSULFATE 10 MG/10ML IV SOLN
INTRAVENOUS | Status: AC
  Filled 2014-12-27: qty 1

## 2014-12-27 MED ORDER — FENTANYL CITRATE (PF) 250 MCG/5ML IJ SOLN
INTRAMUSCULAR | Status: AC
  Filled 2014-12-27: qty 25

## 2014-12-27 MED ORDER — PHENYLEPHRINE 40 MCG/ML (10ML) SYRINGE FOR IV PUSH (FOR BLOOD PRESSURE SUPPORT)
PREFILLED_SYRINGE | INTRAVENOUS | Status: AC
  Filled 2014-12-27: qty 10

## 2014-12-27 MED ORDER — KETOROLAC TROMETHAMINE 60 MG/2ML IM SOLN
60.0000 mg | Freq: Once | INTRAMUSCULAR | Status: AC
  Administered 2014-12-27: 60 mg via INTRAMUSCULAR
  Filled 2014-12-27: qty 2

## 2014-12-27 MED ORDER — CITRIC ACID-SODIUM CITRATE 334-500 MG/5ML PO SOLN
ORAL | Status: AC
  Filled 2014-12-27: qty 15

## 2014-12-27 MED ORDER — MORPHINE SULFATE (PF) 4 MG/ML IV SOLN
4.0000 mg | Freq: Once | INTRAVENOUS | Status: AC
  Administered 2014-12-27: 4 mg via INTRAMUSCULAR
  Filled 2014-12-27: qty 1

## 2014-12-27 MED ORDER — CITRIC ACID-SODIUM CITRATE 334-500 MG/5ML PO SOLN
30.0000 mL | Freq: Once | ORAL | Status: AC
  Administered 2014-12-27: 30 mL via ORAL

## 2014-12-27 MED ORDER — LACTATED RINGERS IV BOLUS (SEPSIS)
1000.0000 mL | Freq: Once | INTRAVENOUS | Status: AC
  Administered 2014-12-27: 1000 mL via INTRAVENOUS

## 2014-12-27 MED ORDER — MIDAZOLAM HCL 2 MG/2ML IJ SOLN
INTRAMUSCULAR | Status: DC | PRN
  Administered 2014-12-27: 2 mg via INTRAVENOUS

## 2014-12-27 MED ORDER — GLYCOPYRROLATE 0.2 MG/ML IJ SOLN
INTRAMUSCULAR | Status: AC
  Filled 2014-12-27: qty 2

## 2014-12-27 MED ORDER — MIDAZOLAM HCL 2 MG/2ML IJ SOLN
INTRAMUSCULAR | Status: AC
  Filled 2014-12-27: qty 4

## 2014-12-27 MED ORDER — PHENYLEPHRINE HCL 10 MG/ML IJ SOLN
INTRAMUSCULAR | Status: DC | PRN
  Administered 2014-12-27: 80 ug via INTRAVENOUS
  Administered 2014-12-27: 40 ug via INTRAVENOUS
  Administered 2014-12-27: 80 ug via INTRAVENOUS

## 2014-12-27 MED ORDER — ROCURONIUM BROMIDE 100 MG/10ML IV SOLN
INTRAVENOUS | Status: AC
  Filled 2014-12-27: qty 1

## 2014-12-27 MED ORDER — ONDANSETRON HCL 4 MG/2ML IJ SOLN
INTRAMUSCULAR | Status: DC | PRN
  Administered 2014-12-27: 4 mg via INTRAVENOUS

## 2014-12-27 MED ORDER — FENTANYL CITRATE (PF) 100 MCG/2ML IJ SOLN
INTRAMUSCULAR | Status: DC | PRN
  Administered 2014-12-27 (×3): 50 ug via INTRAVENOUS
  Administered 2014-12-27: 100 ug via INTRAVENOUS

## 2014-12-27 SURGICAL SUPPLY — 21 items
CATH FOLEY 2WAY SLVR  5CC 16FR (CATHETERS) ×2
CATH FOLEY 2WAY SLVR 5CC 16FR (CATHETERS) ×1 IMPLANT
DRSG COVADERM PLUS 2X2 (GAUZE/BANDAGES/DRESSINGS) ×6 IMPLANT
DRSG OPSITE 4X5.5 SM (GAUZE/BANDAGES/DRESSINGS) ×3 IMPLANT
FORCEPS CUTTING 33CM 5MM (CUTTING FORCEPS) ×3 IMPLANT
GLOVE BIO SURGEON STRL SZ 6.5 (GLOVE) ×4 IMPLANT
GLOVE BIO SURGEONS STRL SZ 6.5 (GLOVE) ×2
GLOVE BIOGEL PI IND STRL 7.0 (GLOVE) ×3 IMPLANT
GLOVE BIOGEL PI INDICATOR 7.0 (GLOVE) ×6
GLOVE ECLIPSE 6.5 STRL STRAW (GLOVE) ×3 IMPLANT
GOWN SURGICAL LARGE (GOWNS) ×6 IMPLANT
PACK LAPAROSCOPY BASIN (CUSTOM PROCEDURE TRAY) ×3 IMPLANT
POUCH SPECIMEN RETRIEVAL 10MM (ENDOMECHANICALS) ×3 IMPLANT
SET IRRIG TUBING LAPAROSCOPIC (IRRIGATION / IRRIGATOR) ×3 IMPLANT
SUT VIC AB 3-0 X1 27 (SUTURE) ×3 IMPLANT
SUT VICRYL 0 UR6 27IN ABS (SUTURE) ×6 IMPLANT
SUT VICRYL 4-0 PS2 18IN ABS (SUTURE) ×3 IMPLANT
TOWEL OR 17X24 6PK STRL BLUE (TOWEL DISPOSABLE) ×3 IMPLANT
TROCAR BALLN 12MMX100 BLUNT (TROCAR) ×3 IMPLANT
TROCAR OPTI TIP 5M 100M (ENDOMECHANICALS) ×6 IMPLANT
WARMER LAPAROSCOPE (MISCELLANEOUS) ×3 IMPLANT

## 2014-12-27 NOTE — H&P (Signed)
Emma Chambers is an 24 y.o. [phone] [redacted]w[redacted]d female.   Chief Complaint: abdominal pain HPI: s/p MTX x 2, increasing pain over last 12 hours.  U/s shows new small hemoperitoneum.  Past Medical History  Diagnosis Date  . UTI (lower urinary tract infection)   . Headache(784.0)     chronic  . Gallstones   . Hypertension     GHTN with last pg  . Pregnancy induced hypertension   . Anemia   . Kidney stones   . Ovarian cyst     Past Surgical History  Procedure Laterality Date  . Cholecystectomy N/A 07/27/2012    Procedure: LAPAROSCOPIC CHOLECYSTECTOMY WITH INTRAOPERATIVE CHOLANGIOGRAM;  Surgeon: Madilyn Hook, DO;  Location: [facility];  Service: General;  Laterality: N/A;  . Appendectomy    . Laparoscopic appendectomy N/A 12/09/2012    Procedure: APPENDECTOMY LAPAROSCOPIC;  Surgeon: Gwenyth Ober, MD;  Location: [facility] OR;  Service: General;  Laterality: N/A;    Family History  Problem Relation Age of Onset  . Asthma Mother   . Hypertension Mother   . Parkinsonism Mother   . Diabetes Maternal Grandmother   . Hypertension Maternal Grandmother   . Stroke Maternal Grandmother   . Heart disease Maternal Grandmother   . Anesthesia problems Neg Hx   . Malignant hyperthermia Neg Hx   . Pseudochol deficiency Neg Hx   . Hypotension Neg Hx   . Asthma Brother   . Lupus Maternal Aunt   . Heart disease Maternal Grandfather    Social History:  reports that she quit smoking about 3 years ago. She has never used smokeless tobacco. She reports that she drinks alcohol. She reports that she does not use illicit drugs.  Allergies:  Allergies  Allergen Reactions  . Amoxicillin Nausea And Vomiting and Other (See Comments)    Tics & twitches Has patient had a PCN reaction causing immediate rash, facial/tongue/throat swelling, SOB or lightheadedness with hypotension: Yes Has patient had a PCN reaction causing severe rash involving mucus membranes or skin necrosis: No Has patient had a PCN reaction that  required hospitalization No Has patient had a PCN reaction occurring within the last 10 years: Yes If all of the above answers are "NO", then may proceed with Cephalosporin use.  . Contrast Media [Iodinated Diagnostic Agents] Hives  . Dilaudid [Hydromorphone Hcl] Itching    No current facility-administered medications on file prior to encounter.   Current Outpatient Prescriptions on File Prior to Encounter  Medication Sig Dispense Refill  . ibuprofen (ADVIL,MOTRIN) 600 MG tablet Take 1 tablet (600 mg total) by mouth every 6 (six) hours as needed. (Patient taking differently: Take 600 mg by mouth every 6 (six) hours as needed for moderate pain. ) 30 tablet 0  . ondansetron (ZOFRAN-ODT) 8 MG disintegrating tablet Take 8 mg by mouth every 8 (eight) hours as needed for nausea or vomiting.    Marland Kitchen oxyCODONE-acetaminophen (PERCOCET/ROXICET) 5-325 MG tablet Take 1-2 tablets by mouth every 6 (six) hours as needed for severe pain. 10 tablet 0    Pertinent items are noted in HPI.  Blood pressure 109/53, pulse 78, temperature 98.5 F (36.9 C), resp. rate 18, height 5' 0.5" (1.537 m), weight 230 lb (104.327 kg), last menstrual period 10/18/2014, SpO2 100 %. General appearance: alert, cooperative, appears stated age, moderate distress and moderately obese Head: Normocephalic, without obvious abnormality, atraumatic Neck: supple, symmetrical, trachea midline Lungs: normal effort Heart: regular rate and rhythm Abdomen: soft, moderately tender, no true rebound. Extremities: extremities  normal, atraumatic, no cyanosis or edema Skin: Skin color, texture, turgor normal. No rashes or lesions Neurologic: Grossly normal   Lab Results  Component Value Date   WBC 8.3 12/27/2014   HGB 10.8* 12/27/2014   HCT 33.5* 12/27/2014   MCV 83.3 12/27/2014   PLT 318 12/27/2014    BHCG 1527 (H) Assessment/Plan Principal Problem:   Ruptured ectopic pregnancy  For laparoscopic removal of ectopic. Risks include but  are not limited to bleeding, infection, injury to surrounding structures, including bowel, bladder and ureters, blood clots, and death.  Likelihood of success is high.   PRATT,TANYA S 12/27/2014, 4:53 PM

## 2014-12-27 NOTE — Anesthesia Procedure Notes (Signed)
Procedure Name: Intubation Date/Time: 12/27/2014 7:59 PM Performed by: Jonna Munro Pre-anesthesia Checklist: Patient identified, Emergency Drugs available, Suction available, Patient being monitored and Timeout performed Patient Re-evaluated:Patient Re-evaluated prior to inductionOxygen Delivery Method: Circle system utilized Preoxygenation: Pre-oxygenation with 100% oxygen Intubation Type: Rapid sequence, IV induction and Cricoid Pressure applied Laryngoscope Size: Mac and 3 Grade View: Grade II Tube type: Oral Tube size: 7.0 mm Number of attempts: 1 Airway Equipment and Method: Stylet and Patient positioned with wedge pillow Placement Confirmation: ETT inserted through vocal cords under direct vision,  positive ETCO2 and breath sounds checked- equal and bilateral Secured at: 21 cm Tube secured with: Tape Dental Injury: Teeth and Oropharynx as per pre-operative assessment

## 2014-12-27 NOTE — Anesthesia Preprocedure Evaluation (Signed)
Anesthesia Evaluation  Patient identified by MRN, date of birth, ID band Patient awake    Reviewed: Allergy & Precautions, NPO status , Patient's Chart, lab work & pertinent test results  Airway Mallampati: III  TM Distance: >3 FB Neck ROM: Full    Dental no notable dental hx. (+) Dental Advisory Given   Pulmonary former smoker,    Pulmonary exam normal breath sounds clear to auscultation       Cardiovascular hypertension, Pt. on medications Normal cardiovascular exam Rhythm:Regular Rate:Normal     Neuro/Psych  Headaches, negative psych ROS   GI/Hepatic negative GI ROS, Neg liver ROS,   Endo/Other  Morbid obesity  Renal/GU negative Renal ROS  negative genitourinary   Musculoskeletal negative musculoskeletal ROS (+)   Abdominal   Peds negative pediatric ROS (+)  Hematology negative hematology ROS (+)   Anesthesia Other Findings   Reproductive/Obstetrics negative OB ROS                             Anesthesia Physical Anesthesia Plan  ASA: III and emergent  Anesthesia Plan: General   Post-op Pain Management:    Induction: Intravenous  Airway Management Planned: Oral ETT  Additional Equipment:   Intra-op Plan:   Post-operative Plan:   Informed Consent: I have reviewed the patients History and Physical, chart, labs and discussed the procedure including the risks, benefits and alternatives for the proposed anesthesia with the patient or authorized representative who has indicated his/her understanding and acceptance.   Dental advisory given  Plan Discussed with:   Anesthesia Plan Comments:         Anesthesia Quick Evaluation

## 2014-12-27 NOTE — Progress Notes (Signed)
Raynelle Jan NP aware pt requesting pain med. Will order MS. Pt up to BR before pain med given

## 2014-12-27 NOTE — Progress Notes (Signed)
Pt talking on phone and request pain med.

## 2014-12-27 NOTE — Transfer of Care (Signed)
Immediate Anesthesia Transfer of Care Note  Patient: Emma Chambers  Procedure(s) Performed: Procedure(s): DIAGNOSTIC LAPAROSCOPY WITH REMOVAL OF ECTOPIC PREGNANCY (N/A)  Patient Location: PACU  Anesthesia Type:General  Level of Consciousness: awake, alert  and oriented  Airway & Oxygen Therapy: Patient Spontanous Breathing and Patient connected to nasal cannula oxygen  Post-op Assessment: Report given to RN and Post -op Vital signs reviewed and stable  Post vital signs: Reviewed and stable  Last Vitals:  Filed Vitals:   12/27/14 1901  BP:   Pulse:   Temp: 36.8 C  Resp:     Complications: No apparent anesthesia complications

## 2014-12-27 NOTE — MAU Provider Note (Signed)
History     CSN: [id number]  Arrival date and time: 12/27/14 1427   First Provider Initiated Contact with Patient 12/27/14 1442      Chief Complaint  Patient presents with  . Abdominal Pain  . Emesis During Pregnancy  . Dizziness   HPIpt is G3P1102 at [redacted]w[redacted]d pregnant with known right ectopic- has had MTX x2- seen last night in MAU with worsening pain.  Pt presents today with increasing pain, dizziness, vomiting.  Pt was to f/u in clinic today or tomorrow to discuss surgical options.  HCG dropped appropriately with ultrasound showing persistant ectopic unchanged.  RN note: Here earlier this am with know ectopic pain. Has had MTX x 2. Pain RLQ is worse now. States passed one big blood clot earlier today in shower but no bleeding since. Some n/v and dizziness Past Medical History  Diagnosis Date  . UTI (lower urinary tract infection)   . Headache(784.0)     chronic  . Gallstones   . Hypertension     GHTN with last pg  . Pregnancy induced hypertension   . Anemia   . Kidney stones   . Ovarian cyst     Past Surgical History  Procedure Laterality Date  . Cholecystectomy N/A 07/27/2012    Procedure: LAPAROSCOPIC CHOLECYSTECTOMY WITH INTRAOPERATIVE CHOLANGIOGRAM;  Surgeon: Emma Hook, DO;  Location: [facility];  Service: General;  Laterality: N/A;  . Appendectomy    . Laparoscopic appendectomy N/A 12/09/2012    Procedure: APPENDECTOMY LAPAROSCOPIC;  Surgeon: Emma Ober, MD;  Location: [facility] OR;  Service: General;  Laterality: N/A;    Family History  Problem Relation Age of Onset  . Asthma Mother   . Hypertension Mother   . Parkinsonism Mother   . Diabetes Maternal Grandmother   . Hypertension Maternal Grandmother   . Stroke Maternal Grandmother   . Heart disease Maternal Grandmother   . Anesthesia problems Neg Hx   . Malignant hyperthermia Neg Hx   . Pseudochol deficiency Neg Hx   . Hypotension Neg Hx   . Asthma Brother   . Lupus Maternal Aunt   . Heart disease Maternal  Grandfather     Social History  Substance Use Topics  . Smoking status: Former Smoker    Quit date: 12/18/2011  . Smokeless tobacco: Never Used     Comment: 2014  . Alcohol Use: 0.0 oz/week     Comment: social    Allergies:  Allergies  Allergen Reactions  . Amoxicillin Nausea And Vomiting and Other (See Comments)    Tics & twitches  . Contrast Media [Iodinated Diagnostic Agents] Hives  . Dilaudid [Hydromorphone Hcl] Itching    Prescriptions prior to admission  Medication Sig Dispense Refill Last Dose  . ibuprofen (ADVIL,MOTRIN) 600 MG tablet Take 1 tablet (600 mg total) by mouth every 6 (six) hours as needed. 30 tablet 0   . ondansetron (ZOFRAN-ODT) 8 MG disintegrating tablet Take 8 mg by mouth every 8 (eight) hours as needed for nausea or vomiting.   12/26/2014 at 2100  . oxyCODONE-acetaminophen (PERCOCET/ROXICET) 5-325 MG tablet Take 1-2 tablets by mouth every 6 (six) hours as needed for severe pain. 10 tablet 0     Review of Systems  Constitutional: Negative for fever and chills.  Gastrointestinal: Positive for nausea, vomiting and abdominal pain.  Neurological: Positive for dizziness.   Physical Exam   Height 5' 0.5" (1.537 m), weight 230 lb (104.327 kg), last menstrual period 10/18/2014.  Physical Exam  Nursing note and vitals  reviewed. Constitutional: She is oriented to person, place, and time. She appears well-developed and well-nourished.  Uncomfortable appearing  HENT:  Head: Normocephalic.  Eyes: Pupils are equal, round, and reactive to light.  Neck: Normal range of motion.  Respiratory: Effort normal.  GI: Soft. She exhibits no distension. There is tenderness. There is guarding. There is no rebound.  Musculoskeletal: Normal range of motion.  Neurological: She is alert and oriented to person, place, and time.  Skin: Skin is warm and dry.  Psychiatric: She has a normal mood and affect.    MAU Course  Procedures Discussed with Dr. Kennon Chambers IV LR with  Toradol 30mg  IV given- pain persists Results for Emma, Chambers (MRN [id number]) as of 12/27/2014 16:33  Ref. Range 12/27/2014 02:00 12/27/2014 02:20 12/27/2014 14:57 12/27/2014 15:00  HCG, Beta Chain, Quant, S Latest Ref Range: <5 mIU/mL 1527 (H)  1609 (H)   Radiologist called stating the ultrasound shows some new hemoperitoneum that was not there this morning.size has slightly increased from 2.3 cm to 2.4 cm Reported to Dr. Kennon Chambers Dr. Kennon Chambers here to see pt Assessment and Plan    Emma Chambers 12/27/2014, 2:45 PM

## 2014-12-27 NOTE — MAU Note (Signed)
Here earlier this am with know ectopic pain. Has had MTX x 2. Pain RLQ is worse now. States passed one big blood clot earlier today in shower but no bleeding since. Some n/v and dizziness

## 2014-12-27 NOTE — Discharge Instructions (Signed)
Methotrexate Treatment for an Ectopic Pregnancy, Care After Refer to this sheet in the next few weeks. These instructions provide you with information on caring for yourself after your procedure. Your health care provider may also give you more specific instructions. Your treatment has been planned according to current medical practices, but problems sometimes occur. Call your health care provider if you have any problems or questions after your procedure. WHAT TO EXPECT AFTER THE PROCEDURE You may have some abdominal cramping, vaginal bleeding, and fatigue in the first few days after taking methotrexate. Some other possible side effects of methotrexate include:  Nausea.  Vomiting.  Diarrhea.  Mouth sores.  Swelling or irritation of the lining of your lungs (pneumonitis).  Liver damage.  Hair loss. HOME CARE INSTRUCTIONS  After you have received the methotrexate medicine, you need to be careful of your activities and watch your condition for several weeks. It may take 1 week before your hormone levels return to normal.  Keep all follow-up appointments as directed by your health care provider.  Avoid traveling too far away from your health care provider.  Do not have sexual intercourse until your health care provider says it is safe to do so.  You may resume your usual diet.  Limit strenuous activity.  Do not take folic acid, prenatal vitamins, or other vitamins that contain folic acid.  Do not take aspirin, ibuprofen, or naproxen (nonsteroidal anti-inflammatory drugs [NSAIDs]).  Do not drink alcohol. SEEK MEDICAL CARE IF:   You cannot control your nausea and vomiting.  You cannot control your diarrhea.  You have sores in your mouth and want treatment.  You need pain medicine for your abdominal pain.  You have a rash.  You are having a reaction to the medicine. SEEK IMMEDIATE MEDICAL CARE IF:   You have increasing abdominal or pelvic pain.  You notice increased  bleeding.  You feel light-headed, or you faint.  You have shortness of breath.  Your heart rate increases.  You have a cough.  You have chills.  You have a fever.   This information is not intended to replace advice given to you by your health care provider. Make sure you discuss any questions you have with your health care provider.   Document Released: 02/21/2011 Document Revised: 03/09/2013 Document Reviewed: 12/21/2012 Elsevier Interactive Patient Education [employer]. Ectopic Pregnancy An ectopic pregnancy happens when a fertilized egg grows outside the uterus. A pregnancy cannot live outside of the uterus. This problem often happens in the fallopian tube. It is often caused by damage to the fallopian tube. If this problem is found early, you may be treated with medicine. If your tube tears or bursts open (ruptures), you will bleed inside. This is an emergency. You will need surgery. Get help right away.  SYMPTOMS You may have normal pregnancy symptoms at first. These include:  Missing your period.  Feeling sick to your stomach (nauseous).  Being tired.  Having tender breasts. Then, you may start to have symptoms that are not normal. These include:  Pain with sex (intercourse).  Bleeding from the vagina. This includes light bleeding (spotting).  Belly (abdomen) or lower belly cramping or pain. This may be felt on one side.  A fast heartbeat (pulse).  Passing out (fainting) after going poop (bowel movement). If your tube tears, you may have symptoms such as:  Really bad pain in the belly or lower belly. This happens suddenly.  Dizziness.  Passing out.  Shoulder pain. GET HELP  RIGHT AWAY IF:  You have any of these symptoms. This is an emergency. MAKE SURE YOU:  Understand these instructions.  Will watch your condition.  Will get help right away if you are not doing well or get worse.   This information is not intended to replace advice given to  you by your health care provider. Make sure you discuss any questions you have with your health care provider.   Document Released: 05/31/2008 Document Revised: 03/09/2013 Document Reviewed: 10/14/2012 Elsevier Interactive Patient Education [employer].

## 2014-12-27 NOTE — MAU Provider Note (Signed)
History     CSN: [id number]  Arrival date and time: 12/27/14 5993   First Provider Initiated Contact with Patient 12/27/14 0147      Chief Complaint  Patient presents with  . Abdominal Pain   HPI Ms. Emma Chambers is a 24 y.o. [id number] at [redacted]w[redacted]d who presents to MAU today with complaint of severe RLQ abdominal pain. The patient has a previously diagnosed ectopic pregnancy for which she has received MTX x 2 on 9/27 and 10/2. She presented on day #4 after second dose of MTX and hCG had shown decline. She did not return to MAU yesterday for day #7 labs. She states that she took a Percocet last night and then went to sleep. She woke up around midnight in severe pain. She has noted a scant amount of vaginal bleeding tonight. She denies N/V or fever.   OB History    Gravida Para Term Preterm AB TAB SAB Ectopic Multiple Living   3 2 1 1  0 0 0 0 0 2      Past Medical History  Diagnosis Date  . UTI (lower urinary tract infection)   . Headache(784.0)     chronic  . Gallstones   . Hypertension     GHTN with last pg  . Pregnancy induced hypertension   . Anemia   . Kidney stones   . Ovarian cyst     Past Surgical History  Procedure Laterality Date  . Cholecystectomy N/A 07/27/2012    Procedure: LAPAROSCOPIC CHOLECYSTECTOMY WITH INTRAOPERATIVE CHOLANGIOGRAM;  Surgeon: Madilyn Hook, DO;  Location: [facility] ORS;  Service: General;  Laterality: N/A;  . Appendectomy    . Laparoscopic appendectomy N/A 12/09/2012    Procedure: APPENDECTOMY LAPAROSCOPIC;  Surgeon: Gwenyth Ober, MD;  Location: [facility] OR;  Service: General;  Laterality: N/A;    Family History  Problem Relation Age of Onset  . Asthma Mother   . Hypertension Mother   . Parkinsonism Mother   . Diabetes Maternal Grandmother   . Hypertension Maternal Grandmother   . Stroke Maternal Grandmother   . Heart disease Maternal Grandmother   . Anesthesia problems Neg Hx   . Malignant hyperthermia Neg Hx   . Pseudochol deficiency Neg Hx    . Hypotension Neg Hx   . Asthma Brother   . Lupus Maternal Aunt   . Heart disease Maternal Grandfather     Social History  Substance Use Topics  . Smoking status: Former Smoker    Quit date: 12/18/2011  . Smokeless tobacco: Never Used     Comment: 2014  . Alcohol Use: 0.0 oz/week     Comment: social    Allergies:  Allergies  Allergen Reactions  . Amoxicillin Nausea And Vomiting and Other (See Comments)    Tics & twitches  . Contrast Media [Iodinated Diagnostic Agents] Hives  . Dilaudid [Hydromorphone Hcl] Itching    Prescriptions prior to admission  Medication Sig Dispense Refill Last Dose  . ondansetron (ZOFRAN-ODT) 8 MG disintegrating tablet Take 8 mg by mouth every 8 (eight) hours as needed for nausea or vomiting.   12/26/2014 at 2100  . [DISCONTINUED] oxyCODONE-acetaminophen (PERCOCET/ROXICET) 5-325 MG tablet Take 2 tablets by mouth every 6 (six) hours as needed for severe pain. 10 tablet 0 12/26/2014 at 2100    Review of Systems  Constitutional: Negative for fever and malaise/fatigue.  Gastrointestinal: Positive for abdominal pain. Negative for nausea, vomiting, diarrhea and constipation.  Genitourinary:       + vaginal bleeding  Physical Exam   Blood pressure 133/75, pulse 79, temperature 98.8 F (37.1 C), temperature source Oral, resp. rate 18, height 5' 0.5" (1.537 m), weight 230 lb (104.327 kg), last menstrual period 10/18/2014, SpO2 100 %.  Physical Exam  Nursing note and vitals reviewed. Constitutional: She is oriented to person, place, and time. She appears well-developed and well-nourished. No distress.  HENT:  Head: Normocephalic and atraumatic.  Cardiovascular: Normal rate.   Respiratory: Effort normal.  GI: Soft. She exhibits no distension and no mass. There is tenderness (moderate tenderness to palpation of the RLQ). There is no rebound and no guarding.  Neurological: She is alert and oriented to person, place, and time.  Skin: Skin is warm and  dry. No erythema.  Psychiatric: She has a normal mood and affect.   Results for orders placed or performed during the hospital encounter of 12/27/14 (from the past 24 hour(s))  hCG, quantitative, pregnancy     Status: Abnormal   Collection Time: 12/27/14  2:00 AM  Result Value Ref Range   hCG, Beta Chain, Quant, S 1527 (H) <5 mIU/mL  CBC     Status: Abnormal   Collection Time: 12/27/14  2:00 AM  Result Value Ref Range   WBC 10.9 (H) 4.0 - 10.5 K/uL   RBC 3.93 3.87 - 5.11 MIL/uL   Hemoglobin 10.6 (L) 12.0 - 15.0 g/dL   HCT 32.6 (L) 36.0 - 46.0 %   MCV 83.0 78.0 - 100.0 fL   MCH 27.0 26.0 - 34.0 pg   MCHC 32.5 30.0 - 36.0 g/dL   RDW 14.9 11.5 - 15.5 %   Platelets 319 150 - 400 K/uL     US Ob Transvaginal  12/27/2014   CLINICAL DATA:  Follow-up right ectopic pregnancy. Methotrexate x2. Increasing pain. Quantitative beta HCG is pending but has been decreasing over the past few days. Estimated gestational age by LMP is 10 weeks 0 days.  EXAM: TRANSVAGINAL OB ULTRASOUND  TECHNIQUE: Transvaginal ultrasound was performed for complete evaluation of the gestation as well as the maternal uterus, adnexal regions, and pelvic cul-de-sac.  COMPARISON:  12/23/2014  FINDINGS: Intrauterine gestational sac: No intrauterine gestational sac identified.  Yolk sac:  Not identified  Embryo:  Not identified  Cardiac Activity: Not identified  Maternal uterus/adnexae: No myometrial mass lesions identified in the uterus. Small amount of fluid in the lower uterine segment/ cervix. Right ovary is visualized. Adjacent to the right ovary, there is a complex appearing lesion measuring 2.3 x 1.6 x 1.7 cm. Measurements are similar to prior study. No enlarging mass. No free fluid collections. Left ovary is not identified. No left adnexal mass visualized.  IMPRESSION: Unchanged appearance of right adnexal mass lesion. No evidence of hemoperitoneum. No intrauterine gestational sac identified.   Electronically Signed   By: Lucienne Capers M.D.   On: 12/27/2014 02:27     MAU Course  Procedures None  MDM 4 mg Morphine given IM CBC, quant hCG and [place] ordered Discussed patient with Dr. Nehemiah Settle. He recommends 60 mg IM Toradol for pain control.  Patient was asleep when RN entered room for pain assessment prior to Toradol. Patient still states pain is 9/10.  60 mg IM Toradol given - patient continues to report RLQ abdominal pain rated at 9/10 even > 30 minutes after Toradol.  Discussed patient again with Dr. Nehemiah Settle. He will come to MAU and evaluate the patient prior to disposition Dr. Nehemiah Settle has evaluated the patient and recommends discharge at this time  with Rx for Ibuprofen and Percocet. Patient to be given urgent follow-up appointment with [facility] in 1-2 days to discuss surgical options.  Assessment and Plan  A: Ectopic pregnancy S/P MTX x2 Significant decline in hCG   P: Discharge home Rx for Percocet and Ibuprofen given to patient Ectopic/bleeding precautions discussed Patient advised to follow-up with [facility] as scheduled on 01/02/15 for follow-up Patient may return to MAU as needed or if her condition were to change or worsen  Luvenia Redden, PA-C  12/27/2014, 4:41 AM

## 2014-12-27 NOTE — Op Note (Signed)
Emma Chambers  PROCEDURE DATE: 12/27/2014  PREOPERATIVE DIAGNOSIS: Bleeding ectopic pregnancy  POSTOPERATIVE DIAGNOSIS: Ruptured right fallopian tube ectopic pregnancy  PROCEDURE: Laparoscopic right salpingectomy   SURGEON:  Dr. Darron Doom  ASSISTANT: Lubertha Sayres, MD  ANESTHESIOLOGIST: Josephine Igo, MD  INDICATIONS: 24 y.o. [name] at [redacted]w[redacted]d here for with ruptured ectopic pregnancy with blood type A pos. Patient was counseled regarding need for laparoscopic salpingectomy. Risks of surgery including bleeding which may require transfusion or reoperation, infection, injury to bowel or other surrounding organs, need for additional procedures including laparotomy and other postoperative/anesthesia complications were explained to patient.  Written informed consent was obtained.  FINDINGS:  minimal amount of hemoperitoneum estimated to be about 25 cc of blood.  Dilated right fallopian tube containing ectopic gestation. Small normal appearing uterus, normal left fallopian tube, right ovary and left ovary.  ANESTHESIA: General  SPECIMENS: right fallopian tube to pathology  COMPLICATIONS: None immediate  PROCEDURE IN DETAIL:  The patient was taken to the operating room where general anesthesia was administered and was found to be adequate.  She was placed in the dorsal lithotomy position, and was prepped and draped in a sterile manner.  A Foley catheter was inserted into her bladder and attached to constant drainage and a uterine manipulator was then advanced into the uterus .  After an adequate timeout was performed, attention was then turned to the patient's abdomen where a 10-mm skin incision was made on the umbilical fold. Fascia and peritoneum were entered sharply. One 0 Vicryl sutures was used to tag the fascia circumferentially.  A Hassan trocar was placed. The laparoscope was introduced.  A survey of the patient's pelvis and abdomen revealed the findings as above.  Two left lower  quadrant ports were placed under direct visualization; 5-mm.  Attention was then turned to the right fallopian tube which was grasped and ligated from the underlying mesosalpinx and uterine attachment using the Gyrus instrument.  Good hemostasis was noted.  The specimen was placed in an EndoCatch bag and removed from the abdomen intact.  The abdomen was desufflated, and all instruments were removed.  The umbilicus incision was closed with the afore mentioned Vicryl suture suture; and all skin incisions were closed with a 3-0 Vicryl subcuticular stitch. The patient tolerated the procedures well.  All instruments, needles, and sponge counts were correct x 2. The patient was taken to the recovery room in stable condition.   PRATT,TANYA S MD 12/27/2014 8:57 PM

## 2014-12-27 NOTE — MAU Note (Signed)
Pt states she has had 2 doses MTX, pain much worse tonight. Took percocet at 9pm, did not help. Started having some spotted. Rates 10/10.

## 2014-12-27 NOTE — MAU Note (Signed)
Dr Jillyn Hidden in to see pt

## 2014-12-27 NOTE — Progress Notes (Signed)
Pt states no change in pain but is much stiller since returning from u/s. Is not moving around in bed like before u/s

## 2014-12-27 NOTE — Discharge Instructions (Signed)
Ruptured Ectopic Pregnancy °An ectopic pregnancy is when the fertilized egg attaches (implants) outside the uterus. Most ectopic pregnancies occur in the fallopian tube. Rarely do ectopic pregnancies occur on the ovary, intestine, pelvis, or cervix. An ectopic pregnancy does not have the ability to develop into a normal, healthy baby.  °A ruptured ectopic pregnancy is one in which the fallopian tube gets torn or bursts and results in internal bleeding. Often there is intense abdominal pain, and sometimes, vaginal bleeding. Having an ectopic pregnancy can be a life-threatening experience. If left untreated, this dangerous condition can lead to a blood transfusion, abdominal surgery, or even death.  °CAUSES  °Damage to the fallopian tubes is the suspected cause in most ectopic pregnancies.  °RISK FACTORS °Depending on your circumstances, the amount of risk of having an ectopic pregnancy will vary. There are 3 categories that may help you identify whether you are potentially at risk. °High Risk °· You have gone through infertility treatment. °· You have had a previous ectopic pregnancy. °· You have had previous tubal surgery. °· You have had previous surgery to have the fallopian tubes tied (tubal ligation). °· You have tubal problems or diseases. °· You have been exposed to DES. DES is a medicine that was used until 1971 and had effects on babies whose mothers took the medicine. °· You become pregnant while using an intrauterine device (IUD) for birth control.  °Moderate Risk °· You have a history of infertility. °· You have a history of a sexually transmitted infection (STI). °· You have a history of pelvic inflammatory disease (PID). °· You have scarring from endometriosis. °· You have multiple sexual partners. °· You smoke.  °Low Risk °· You have had previous pelvic surgery. °· You use vaginal douching. °· You became sexually active before 25 years of age. °SYMPTOMS °An ectopic pregnancy should be suspected in  anyone who has missed a period and has abdominal pain or bleeding. °· You may experience normal pregnancy symptoms, such as: °¨ Nausea. °¨ Tiredness. °¨ Breast tenderness. °· Symptoms that are not normal include: °¨ Pain with intercourse. °¨ Irregular vaginal bleeding or spotting. °¨ Cramping or pain on one side, or in the lower abdomen. °¨ Fast heartbeat. °¨ Passing out while having a bowel movement. °· Symptoms of a ruptured ectopic pregnancy and internal bleeding may include: °¨ Sudden, severe pain in the abdomen and pelvis. °¨ Dizziness or fainting. °¨ Pain in the shoulder area. °DIAGNOSIS  °Tests that may be performed include: °· A pregnancy test. °· An ultrasound. °· Testing the specific level of pregnancy hormone in the bloodstream. °· Taking a sample of uterus tissue (dilation and curettage, D&C). °· Surgery to perform a visual exam of the inside of the abdomen using a lighted tube (laparoscopy). °TREATMENT  °Laparoscopic surgery or abdominal surgery is recommended for a ruptured ectopic pregnancy.  °· The whole fallopian tube may need to be removed (salpingectomy). °· If the tube is not too damaged, the tube may be saved, and the pregnancy will be surgically removed. In time, the tube may still function. °· If you have lost a lot of blood, you may need a blood transfusion. °· You may receive a Rho (D) immune globulin shot if you are Rh negative and the father is Rh positive, or if you do not know the Rh type of the father. This is to prevent problems with any future pregnancy. °SEEK IMMEDIATE MEDICAL CARE IF:  °You have any symptoms of an ectopic or ruptured ectopic pregnancy. This   is a medical emergency. MAKE SURE YOU:  Understand these instructions.  Will watch your condition.  Will get help right away if you are not doing well or get worse.   This information is not intended to replace advice given to you by your health care provider. Make sure you discuss any questions you have with your health  care provider.   Document Released: 03/01/2000 Document Revised: 03/09/2013 Document Reviewed: 12/14/2012 Elsevier Interactive Patient Education [employer].

## 2014-12-28 ENCOUNTER — Encounter (HOSPITAL_COMMUNITY): Payer: Self-pay | Admitting: Family Medicine

## 2014-12-28 ENCOUNTER — Encounter: Payer: Self-pay | Admitting: *Deleted

## 2014-12-28 ENCOUNTER — Telehealth: Payer: Self-pay | Admitting: *Deleted

## 2014-12-28 NOTE — Telephone Encounter (Signed)
Pt called the clinic requesting letter for work due to recent surgery.  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  Pt request to be out of work one week and one week with restrictions. Pt to come to clinic to retrieve letter.

## 2015-01-01 ENCOUNTER — Encounter (HOSPITAL_COMMUNITY): Payer: Self-pay

## 2015-01-01 ENCOUNTER — Emergency Department (HOSPITAL_COMMUNITY)
Admission: EM | Admit: 2015-01-01 | Discharge: 2015-01-02 | Disposition: A | Discharge status: Home / Self Care | Payer: Self-pay | Attending: Emergency Medicine | Admitting: Emergency Medicine

## 2015-01-01 DIAGNOSIS — Z880 Allergy status to penicillin: Secondary | ICD-10-CM | POA: Insufficient documentation

## 2015-01-01 DIAGNOSIS — Z8742 Personal history of other diseases of the female genital tract: Secondary | ICD-10-CM | POA: Insufficient documentation

## 2015-01-01 DIAGNOSIS — L02216 Cutaneous abscess of umbilicus: Secondary | ICD-10-CM | POA: Insufficient documentation

## 2015-01-01 DIAGNOSIS — Z87440 Personal history of urinary (tract) infections: Secondary | ICD-10-CM | POA: Insufficient documentation

## 2015-01-01 DIAGNOSIS — Z862 Personal history of diseases of the blood and blood-forming organs and certain disorders involving the immune mechanism: Secondary | ICD-10-CM | POA: Insufficient documentation

## 2015-01-01 DIAGNOSIS — Z9049 Acquired absence of other specified parts of digestive tract: Secondary | ICD-10-CM | POA: Insufficient documentation

## 2015-01-01 DIAGNOSIS — Z8719 Personal history of other diseases of the digestive system: Secondary | ICD-10-CM | POA: Insufficient documentation

## 2015-01-01 DIAGNOSIS — Z87891 Personal history of nicotine dependence: Secondary | ICD-10-CM | POA: Insufficient documentation

## 2015-01-01 DIAGNOSIS — Z87442 Personal history of urinary calculi: Secondary | ICD-10-CM | POA: Insufficient documentation

## 2015-01-01 NOTE — ED Notes (Signed)
Pt states she had an ectopic pregnancy and had surgery on October 11th. Pt presents with abscess inside umbilicus. She states it ruptured earlier today and started to drain purulent drainage. Pt here for pain at this area.

## 2015-01-02 ENCOUNTER — Ambulatory Visit: Payer: Self-pay | Admitting: Obstetrics and Gynecology

## 2015-01-02 MED ORDER — CLINDAMYCIN HCL 150 MG PO CAPS: 300.0000 mg | ORAL_CAPSULE | Freq: Three times a day (TID) | ORAL | Status: DC

## 2015-01-02 MED ORDER — CLINDAMYCIN HCL 300 MG PO CAPS
300.0000 mg | ORAL_CAPSULE | Freq: Once | ORAL | Status: AC
  Administered 2015-01-02: 300 mg via ORAL
  Filled 2015-01-02: qty 1

## 2015-01-02 NOTE — Anesthesia Postprocedure Evaluation (Signed)
  Anesthesia Post-op Note  Patient: Emma Chambers  Procedure(s) Performed: Procedure(s): DIAGNOSTIC LAPAROSCOPY, Right Salpingectomy with removal ECTOPIC PREGNANCY (N/A)  Patient Location: PACU  Anesthesia Type:General  Level of Consciousness: awake, alert  and oriented  Airway and Oxygen Therapy: Patient Spontanous Breathing  Post-op Pain: none  Post-op Assessment: Post-op Vital signs reviewed, Patient's Cardiovascular Status Stable, Respiratory Function Stable, Patent Airway, No signs of Nausea or vomiting and Pain level controlled              Post-op Vital Signs: Reviewed and stable  Last Vitals:  Filed Vitals:   12/27/14 2230  BP: 100/60  Pulse: 72  Temp: 36.7 C  Resp: 20    Complications: No apparent anesthesia complications

## 2015-01-02 NOTE — Discharge Instructions (Signed)
Take clindamycin as directed until gone. Follow up with your OBGYN. Return to the ED with worsening or concerning symptoms.

## 2015-01-02 NOTE — ED Provider Notes (Signed)
CSN: [id number]     Arrival date & time 01/01/15  2259 History   First MD Initiated Contact with Patient 01/02/15 0007     Chief Complaint  Patient presents with  . Post-op Problem     (Consider location/radiation/quality/duration/timing/severity/associated sxs/prior Treatment) Patient is a 24 y.o. female presenting with abscess. The history is provided by the patient. No language interpreter was used.  Abscess Location:  Torso Torso abscess location: umbilicus. Size:  1x1cm Abscess quality: draining and redness   Red streaking: no   Duration:  1 day Progression:  Unchanged Chronicity:  New Context: not diabetes, not immunosuppression, not injected drug use, not insect bite/sting and not skin injury   Context comment:  Incision of surgery for ectopic pregnancy Relieved by:  Draining/squeezing Worsened by:  Nothing tried Ineffective treatments:  None tried Associated symptoms: no anorexia, no fatigue, no fever, no headaches, no nausea and no vomiting   Risk factors: no family hx of MRSA, no hx of MRSA and no prior abscess     Past Medical History  Diagnosis Date  . UTI (lower urinary tract infection)   . Headache(784.0)     chronic  . Gallstones   . Hypertension     GHTN with last pg  . Pregnancy induced hypertension   . Anemia   . Kidney stones   . Ovarian cyst    Past Surgical History  Procedure Laterality Date  . Cholecystectomy N/A 07/27/2012    Procedure: LAPAROSCOPIC CHOLECYSTECTOMY WITH INTRAOPERATIVE CHOLANGIOGRAM;  Surgeon: Madilyn Hook, DO;  Location: [facility];  Service: General;  Laterality: N/A;  . Appendectomy    . Laparoscopic appendectomy N/A 12/09/2012    Procedure: APPENDECTOMY LAPAROSCOPIC;  Surgeon: Gwenyth Ober, MD;  Location: [facility];  Service: General;  Laterality: N/A;  . Diagnostic laparoscopy with removal of ectopic pregnancy N/A 12/27/2014    Procedure: DIAGNOSTIC LAPAROSCOPY, Right Salpingectomy with removal ECTOPIC PREGNANCY;  Surgeon: [name], MD;  Location: [facility];  Service: Gynecology;  Laterality: N/A;   Family History  Problem Relation Age of Onset  . Asthma Mother   . Hypertension Mother   . Parkinsonism Mother   . Diabetes Maternal Grandmother   . Hypertension Maternal Grandmother   . Stroke Maternal Grandmother   . Heart disease Maternal Grandmother   . Anesthesia problems Neg Hx   . Malignant hyperthermia Neg Hx   . Pseudochol deficiency Neg Hx   . Hypotension Neg Hx   . Asthma Brother   . Lupus Maternal Aunt   . Heart disease Maternal Grandfather    Social History  Substance Use Topics  . Smoking status: Former Smoker    Quit date: 12/18/2011  . Smokeless tobacco: Never Used     Comment: 2014  . Alcohol Use: 0.0 oz/week     Comment: social   OB History    Gravida Para Term Preterm AB TAB SAB Ectopic Multiple Living   3 2 1 1  0 0 0 0 0 2     Review of Systems  Constitutional: Negative for fever and fatigue.  Gastrointestinal: Negative for nausea, vomiting and anorexia.  Skin: Positive for wound.  Neurological: Negative for headaches.  All other systems reviewed and are negative.     Allergies  Amoxicillin; Contrast media; and Dilaudid  Home Medications   Prior to Admission medications   Medication Sig Start Date End Date Taking? Authorizing Provider  ibuprofen (ADVIL,MOTRIN) 600 MG tablet Take 1 tablet (600 mg total) by mouth every  6 (six) hours as needed. Patient taking differently: Take 600 mg by mouth every 6 (six) hours as needed for moderate pain.  12/27/14   Luvenia Redden, PA-C  ondansetron (ZOFRAN-ODT) 8 MG disintegrating tablet Take 8 mg by mouth every 8 (eight) hours as needed for nausea or vomiting.    Historical Provider, MD  oxyCODONE-acetaminophen (PERCOCET/ROXICET) 5-325 MG tablet Take 1-2 tablets by mouth every 6 (six) hours as needed. 12/27/14   [name], MD   BP 127/74 mmHg  Pulse 99  Temp(Src) 99.2 F (37.3 C) (Oral)  Resp 16  SpO2 100%  LMP  10/18/2014 Physical Exam  Constitutional: She is oriented to person, place, and time. She appears well-developed and well-nourished. No distress.  HENT:  Head: Normocephalic and atraumatic.  Eyes: Conjunctivae and EOM are normal.  Neck: Normal range of motion.  Cardiovascular: Normal rate and regular rhythm.  Exam reveals no gallop and no friction rub.   No murmur heard. Pulmonary/Chest: Effort normal and breath sounds normal. She has no wheezes. She has no rales. She exhibits no tenderness.  Abdominal: Soft. She exhibits no distension. There is no tenderness. There is no rebound.  Musculoskeletal: Normal range of motion.  Neurological: She is alert and oriented to person, place, and time. Coordination normal.  Speech is goal-oriented. Moves limbs without ataxia.   Skin: Skin is warm and dry.  Small amount of purulent drainage at the umbilicus with small amount of surrounding erythema. No open wound.   Psychiatric: She has a normal mood and affect. Her behavior is normal.  Nursing note and vitals reviewed.   ED Course  Procedures (including critical care time) Labs Review Labs Reviewed - No data to display  Imaging Review No results found. I have personally reviewed and evaluated these images and lab results as part of my medical decision-making.   EKG Interpretation None      MDM   Final diagnoses:  Cutaneous abscess of umbilicus    30:09 AM Patient has a superficial infection of umbilicus. She has no fever or other signs of systemic infection. Abdomen mildly tender at infected site. I doubt deep infection at this time. Patient will have clindamycin for infection and recommended follow up with her OBGYN tomorrow. Patient instructed to return with worsening or concerning symptoms.     Alvina Chou, PA-C 01/02/15 0034  Charlesetta Shanks, MD 01/07/15 [phone]

## 2015-02-01 ENCOUNTER — Emergency Department (HOSPITAL_COMMUNITY): Payer: Self-pay

## 2015-02-01 ENCOUNTER — Emergency Department (HOSPITAL_COMMUNITY)
Admission: EM | Admit: 2015-02-01 | Discharge: 2015-02-01 | Disposition: A | Discharge status: Home / Self Care | Payer: Self-pay | Attending: Emergency Medicine | Admitting: Emergency Medicine

## 2015-02-01 ENCOUNTER — Encounter (HOSPITAL_COMMUNITY): Payer: Self-pay | Admitting: Emergency Medicine

## 2015-02-01 DIAGNOSIS — Y9389 Activity, other specified: Secondary | ICD-10-CM | POA: Insufficient documentation

## 2015-02-01 DIAGNOSIS — W228XXA Striking against or struck by other objects, initial encounter: Secondary | ICD-10-CM | POA: Insufficient documentation

## 2015-02-01 DIAGNOSIS — M79641 Pain in right hand: Secondary | ICD-10-CM

## 2015-02-01 DIAGNOSIS — Z792 Long term (current) use of antibiotics: Secondary | ICD-10-CM | POA: Insufficient documentation

## 2015-02-01 DIAGNOSIS — Z880 Allergy status to penicillin: Secondary | ICD-10-CM | POA: Insufficient documentation

## 2015-02-01 DIAGNOSIS — Z87891 Personal history of nicotine dependence: Secondary | ICD-10-CM | POA: Insufficient documentation

## 2015-02-01 DIAGNOSIS — Z8742 Personal history of other diseases of the female genital tract: Secondary | ICD-10-CM | POA: Insufficient documentation

## 2015-02-01 DIAGNOSIS — S6991XA Unspecified injury of right wrist, hand and finger(s), initial encounter: Secondary | ICD-10-CM | POA: Insufficient documentation

## 2015-02-01 DIAGNOSIS — I10 Essential (primary) hypertension: Secondary | ICD-10-CM | POA: Insufficient documentation

## 2015-02-01 DIAGNOSIS — Y9289 Other specified places as the place of occurrence of the external cause: Secondary | ICD-10-CM | POA: Insufficient documentation

## 2015-02-01 DIAGNOSIS — Z87440 Personal history of urinary (tract) infections: Secondary | ICD-10-CM | POA: Insufficient documentation

## 2015-02-01 DIAGNOSIS — Z862 Personal history of diseases of the blood and blood-forming organs and certain disorders involving the immune mechanism: Secondary | ICD-10-CM | POA: Insufficient documentation

## 2015-02-01 DIAGNOSIS — Z87442 Personal history of urinary calculi: Secondary | ICD-10-CM | POA: Insufficient documentation

## 2015-02-01 DIAGNOSIS — Y990 Civilian activity done for income or pay: Secondary | ICD-10-CM | POA: Insufficient documentation

## 2015-02-01 DIAGNOSIS — R05 Cough: Secondary | ICD-10-CM | POA: Insufficient documentation

## 2015-02-01 NOTE — ED Notes (Signed)
Patient transported to X-ray 

## 2015-02-01 NOTE — ED Provider Notes (Signed)
CSN: [id number]     Arrival date & time 02/01/15  1218 History  By signing my name below, I, Essence Howell, attest that this documentation has been prepared under the direction and in the presence of Okey Regal, PA-C Electronically Signed: Ladene Artist, ED Scribe 02/01/2015 at 4:28 PM.   Chief Complaint  Patient presents with  . Hand Pain   The history is provided by the patient. No language interpreter was used.   HPI Comments: Emma Chambers is a 24 y.o. female who presents to the Emergency Department complaining of a right thumb injury sustained last night. Pt works at [employer]; states that she injured her right thumb when a box was pushed against her thumb last night. She reports constant, aching pain that is exacerbated with movement. Pt has applied ice and a topical cream without relief.   Pt also presents with a dry cough first noticed 2 days ago. She reports associated sneezing, chills, generalized body aches for the past 2 days as well. No treatments tried PTA. Pt denies SOB, abdominal pain, nausea, vomiting.   Past Medical History  Diagnosis Date  . UTI (lower urinary tract infection)   . Headache(784.0)     chronic  . Gallstones   . Hypertension     GHTN with last pg  . Pregnancy induced hypertension   . Anemia   . Kidney stones   . Ovarian cyst    Past Surgical History  Procedure Laterality Date  . Cholecystectomy N/A 07/27/2012    Procedure: LAPAROSCOPIC CHOLECYSTECTOMY WITH INTRAOPERATIVE CHOLANGIOGRAM;  Surgeon: Madilyn Hook, DO;  Location: [facility];  Service: General;  Laterality: N/A;  . Appendectomy    . Laparoscopic appendectomy N/A 12/09/2012    Procedure: APPENDECTOMY LAPAROSCOPIC;  Surgeon: Gwenyth Ober, MD;  Location: [facility];  Service: General;  Laterality: N/A;  . Diagnostic laparoscopy with removal of ectopic pregnancy N/A 12/27/2014    Procedure: DIAGNOSTIC LAPAROSCOPY, Right Salpingectomy with removal ECTOPIC PREGNANCY;  Surgeon: [name], MD;   Location: [facility];  Service: Gynecology;  Laterality: N/A;   Family History  Problem Relation Age of Onset  . Asthma Mother   . Hypertension Mother   . Parkinsonism Mother   . Diabetes Maternal Grandmother   . Hypertension Maternal Grandmother   . Stroke Maternal Grandmother   . Heart disease Maternal Grandmother   . Anesthesia problems Neg Hx   . Malignant hyperthermia Neg Hx   . Pseudochol deficiency Neg Hx   . Hypotension Neg Hx   . Asthma Brother   . Lupus Maternal Aunt   . Heart disease Maternal Grandfather    Social History  Substance Use Topics  . Smoking status: Former Smoker    Quit date: 12/18/2011  . Smokeless tobacco: Never Used     Comment: 2014  . Alcohol Use: 0.0 oz/week     Comment: social   OB History    Gravida Para Term Preterm AB TAB SAB Ectopic Multiple Living   3 2 1 1  0 0 0 0 0 2     Review of Systems  All other systems reviewed and are negative.  Allergies  Amoxicillin; Contrast media; and Dilaudid  Home Medications   Prior to Admission medications   Medication Sig Start Date End Date Taking? Authorizing Provider  clindamycin (CLEOCIN) 150 MG capsule Take 2 capsules (300 mg total) by mouth 3 (three) times daily. May dispense as 150mg  capsules 01/02/15   Kaitlyn Szekalski, PA-C  ibuprofen (ADVIL,MOTRIN) 600 MG  tablet Take 1 tablet (600 mg total) by mouth every 6 (six) hours as needed. Patient taking differently: Take 600 mg by mouth every 6 (six) hours as needed for moderate pain.  12/27/14   Luvenia Redden, PA-C  ondansetron (ZOFRAN-ODT) 8 MG disintegrating tablet Take 8 mg by mouth every 8 (eight) hours as needed for nausea or vomiting.    Historical Provider, MD  oxyCODONE-acetaminophen (PERCOCET/ROXICET) 5-325 MG tablet Take 1-2 tablets by mouth every 6 (six) hours as needed. 12/27/14   [name], MD   BP 111/77 mmHg  Pulse 80  Temp(Src) 97.8 F (36.6 C) (Oral)  Resp 18  Ht 5' (1.524 m)  Wt 220 lb (99.791 kg)  BMI 42.97 kg/m2   SpO2 99%  LMP 01/30/2015  Breastfeeding? Unknown Physical Exam  Constitutional: She is oriented to person, place, and time. She appears well-developed and well-nourished. No distress.  HENT:  Head: Normocephalic and atraumatic.  Right Ear: Tympanic membrane normal.  Left Ear: Tympanic membrane normal.  Nose: No rhinorrhea.  Mouth/Throat: Oropharynx is clear and moist and mucous membranes are normal.  No congestion.  Eyes: Conjunctivae and EOM are normal.  Neck: Neck supple. No tracheal deviation present.  Cardiovascular: Normal rate.   Pulmonary/Chest: Effort normal. No respiratory distress.  Musculoskeletal: Normal range of motion.  Equal bilateral. No swelling. Cap refill less than 3 seconds.   Neurological: She is alert and oriented to person, place, and time.  Skin: Skin is warm and dry.  Psychiatric: She has a normal mood and affect. Her behavior is normal.  Nursing note and vitals reviewed.  ED Course  Procedures (including critical care time) DIAGNOSTIC STUDIES: Oxygen Saturation is 99% on RA, normal by my interpretation.    COORDINATION OF CARE: 1:47 PM-Discussed treatment plan which includes XR with pt at bedside and pt agreed to plan.   Labs Review Labs Reviewed - No data to display  Imaging Review Dg Finger Thumb Right  02/01/2015  CLINICAL DATA:  Pain secondary to blunt trauma yesterday. EXAM: RIGHT THUMB 2+V COMPARISON:  None. FINDINGS: There is no evidence of fracture or dislocation. There is no evidence of arthropathy or other focal bone abnormality. Soft tissues are unremarkable IMPRESSION: Negative. Electronically Signed   By: Lorriane Shire M.D.   On: 02/01/2015 13:33   I have personally reviewed and evaluated these images and lab results as part of my medical decision-making.   EKG Interpretation None      MDM   Final diagnoses:  Pain of right hand   Labs:  Imaging: DG finger thumb right- no acute  findings  Consults:  Therapeutics:  Discharge Meds: ibuprofen   Assessment/Plan: Patient presents with complaints of right thumb and hand pain. No obvious signs of trauma, full active range of motion of the digit, no laxity noted on exam. Patient also reports upper respiratory symptoms, no objective findings on exam, meters per patent without rhinorrhea, ears without signs of infection throat normal. Patient nontoxic in no acute distress. Patient will be discharged home with symptomatic care instructions including ibuprofen Tylenol, ice as needed for pain. Follow-up with primary care in 1 week if symptoms persist, follow-up sooner as needed. Patient verbalizes understanding and agreement to today's plan and had no further questions or concerns at the time of discharge.  I personally performed the services described in this documentation, which was scribed in my presence. The recorded information has been reviewed and is accurate.    Okey Regal, PA-C 02/01/15 [phone]  Blanchie Dessert, MD 02/02/15 [phone]

## 2015-02-01 NOTE — ED Notes (Signed)
E-signature pad not functioning in room.  Pt verbalized understanding of discharge instructions.  All questions answered.  Pt verbalized understanding of follow-up resources.  Pt able to ambulate independently.  Gait steady and even.

## 2015-02-01 NOTE — Discharge Instructions (Signed)
Musculoskeletal Pain Musculoskeletal pain is muscle and boney aches and pains. These pains can occur in any part of the body. Your caregiver may treat you without knowing the cause of the pain. They may treat you if blood or urine tests, X-rays, and other tests were normal.  CAUSES There is often not a definite cause or reason for these pains. These pains may be caused by a type of germ (virus). The discomfort may also come from overuse. Overuse includes working out too hard when your body is not fit. Boney aches also come from weather changes. Bone is sensitive to atmospheric pressure changes. HOME CARE INSTRUCTIONS   Ask when your test results will be ready. Make sure you get your test results.  Only take over-the-counter or prescription medicines for pain, discomfort, or fever as directed by your caregiver. If you were given medications for your condition, do not drive, operate machinery or power tools, or sign legal documents for 24 hours. Do not drink alcohol. Do not take sleeping pills or other medications that may interfere with treatment.  Continue all activities unless the activities cause more pain. When the pain lessens, slowly resume normal activities. Gradually increase the intensity and duration of the activities or exercise.  During periods of severe pain, bed rest may be helpful. Lay or sit in any position that is comfortable.  Putting ice on the injured area.  Put ice in a bag.  Place a towel between your skin and the bag.  Leave the ice on for 15 to 20 minutes, 3 to 4 times a day.  Follow up with your caregiver for continued problems and no reason can be found for the pain. If the pain becomes worse or does not go away, it may be necessary to repeat tests or do additional testing. Your caregiver may need to look further for a possible cause. SEEK IMMEDIATE MEDICAL CARE IF:  You have pain that is getting worse and is not relieved by medications.  You develop chest pain  that is associated with shortness or breath, sweating, feeling sick to your stomach (nauseous), or throw up (vomit).  Your pain becomes localized to the abdomen.  You develop any new symptoms that seem different or that concern you. MAKE SURE YOU:   Understand these instructions.  Will watch your condition.  Will get help right away if you are not doing well or get worse.   This information is not intended to replace advice given to you by your health care provider. Make sure you discuss any questions you have with your health care provider.   Document Released: 03/04/2005 Document Revised: 05/27/2011 Document Reviewed: 11/06/2012 Elsevier Interactive Patient Education 2016 [employer].   Please read attached information. If you experience any new or worsening signs or symptoms please return to the emergency room for evaluation. Please follow-up with your primary care provider or specialist as discussed. Please use medication prescribed only as directed and discontinue taking if you have any concerning signs or symptoms.

## 2015-02-01 NOTE — ED Notes (Signed)
Right thumb injury at work, also c/o body aches, alert, ambulatory and in NAD

## 2015-02-22 ENCOUNTER — Emergency Department (HOSPITAL_COMMUNITY): Payer: Self-pay

## 2015-02-22 ENCOUNTER — Encounter (HOSPITAL_COMMUNITY): Payer: Self-pay | Admitting: Emergency Medicine

## 2015-02-22 ENCOUNTER — Emergency Department (HOSPITAL_COMMUNITY)
Admission: EM | Admit: 2015-02-22 | Discharge: 2015-02-22 | Disposition: A | Discharge status: Home / Self Care | Payer: Self-pay | Attending: Emergency Medicine | Admitting: Emergency Medicine

## 2015-02-22 DIAGNOSIS — Z880 Allergy status to penicillin: Secondary | ICD-10-CM | POA: Insufficient documentation

## 2015-02-22 DIAGNOSIS — Z87440 Personal history of urinary (tract) infections: Secondary | ICD-10-CM | POA: Insufficient documentation

## 2015-02-22 DIAGNOSIS — Z8719 Personal history of other diseases of the digestive system: Secondary | ICD-10-CM | POA: Insufficient documentation

## 2015-02-22 DIAGNOSIS — Z87442 Personal history of urinary calculi: Secondary | ICD-10-CM | POA: Insufficient documentation

## 2015-02-22 DIAGNOSIS — Z87891 Personal history of nicotine dependence: Secondary | ICD-10-CM | POA: Insufficient documentation

## 2015-02-22 DIAGNOSIS — Z3202 Encounter for pregnancy test, result negative: Secondary | ICD-10-CM | POA: Insufficient documentation

## 2015-02-22 DIAGNOSIS — Z862 Personal history of diseases of the blood and blood-forming organs and certain disorders involving the immune mechanism: Secondary | ICD-10-CM | POA: Insufficient documentation

## 2015-02-22 DIAGNOSIS — Z8742 Personal history of other diseases of the female genital tract: Secondary | ICD-10-CM | POA: Insufficient documentation

## 2015-02-22 DIAGNOSIS — E162 Hypoglycemia, unspecified: Secondary | ICD-10-CM | POA: Insufficient documentation

## 2015-02-22 DIAGNOSIS — R55 Syncope and collapse: Secondary | ICD-10-CM | POA: Insufficient documentation

## 2015-02-22 DIAGNOSIS — I10 Essential (primary) hypertension: Secondary | ICD-10-CM | POA: Insufficient documentation

## 2015-02-22 LAB — I-STAT CHEM 8, ED
BUN: 13 mg/dL (ref 6–20)
Calcium, Ion: 1.17 mmol/L (ref 1.12–1.23)
Chloride: 105 mmol/L (ref 101–111)
Creatinine, Ser: 0.7 mg/dL (ref 0.44–1.00)
Glucose, Bld: 67 mg/dL (ref 65–99)
HCT: 40 % (ref 36.0–46.0)
Hemoglobin: 13.6 g/dL (ref 12.0–15.0)
Potassium: 3.3 mmol/L — ABNORMAL LOW (ref 3.5–5.1)
Sodium: 141 mmol/L (ref 135–145)
TCO2: 22 mmol/L (ref 0–100)

## 2015-02-22 LAB — URINALYSIS, ROUTINE W REFLEX MICROSCOPIC
Bilirubin Urine: NEGATIVE
Glucose, UA: NEGATIVE mg/dL
Ketones, ur: 15 mg/dL — AB
Leukocytes, UA: NEGATIVE
Nitrite: NEGATIVE
Protein, ur: NEGATIVE mg/dL
Specific Gravity, Urine: 1.027 (ref 1.005–1.030)
pH: 5 (ref 5.0–8.0)

## 2015-02-22 LAB — CBC
HCT: 37.3 % (ref 36.0–46.0)
Hemoglobin: 11.9 g/dL — ABNORMAL LOW (ref 12.0–15.0)
MCH: 26.7 pg (ref 26.0–34.0)
MCHC: 31.9 g/dL (ref 30.0–36.0)
MCV: 83.6 fL (ref 78.0–100.0)
Platelets: 343 10*3/uL (ref 150–400)
RBC: 4.46 MIL/uL (ref 3.87–5.11)
RDW: 14.6 % (ref 11.5–15.5)
WBC: 10.2 10*3/uL (ref 4.0–10.5)

## 2015-02-22 LAB — BASIC METABOLIC PANEL
Anion gap: 9 (ref 5–15)
BUN: 11 mg/dL (ref 6–20)
CO2: 22 mmol/L (ref 22–32)
Calcium: 9.4 mg/dL (ref 8.9–10.3)
Chloride: 105 mmol/L (ref 101–111)
Creatinine, Ser: 0.75 mg/dL (ref 0.44–1.00)
GFR calc Af Amer: >60 mL/min (ref >60)
GFR calc non Af Amer: >60 mL/min (ref >60)
Glucose, Bld: 73 mg/dL (ref 65–99)
Potassium: 3.4 mmol/L — ABNORMAL LOW (ref 3.5–5.1)
Sodium: 136 mmol/L (ref 135–145)

## 2015-02-22 LAB — URINE MICROSCOPIC-ADD ON

## 2015-02-22 LAB — PREGNANCY, URINE: Preg Test, Ur: NEGATIVE

## 2015-02-22 LAB — CBG MONITORING, ED
Glucose-Capillary: 64 mg/dL — ABNORMAL LOW (ref 65–99)
Glucose-Capillary: 69 mg/dL (ref 65–99)

## 2015-02-22 NOTE — ED Notes (Signed)
Per pt, she drank a redbull while at work this am and fell about 0200. Pt c/o headache to the back of the head. Pt states she lost consciousness, and she was found by coworkers. Denies n/v, chest pain. States she felt hot and sweaty, and short of breath prior to passing out.

## 2015-02-22 NOTE — Discharge Instructions (Signed)
You need to make sure to stay hydrated and eat frequent small meals.  See return precautions below.  Syncope Syncope is a medical term for fainting or passing out. This means you lose consciousness and drop to the ground. People are generally unconscious for less than 5 minutes. You may have some muscle twitches for up to 15 seconds before waking up and returning to normal. Syncope occurs more often in older adults, but it can happen to anyone. While most causes of syncope are not dangerous, syncope can be a sign of a serious medical problem. It is important to seek medical care.  CAUSES  Syncope is caused by a sudden drop in blood flow to the brain. The specific cause is often not determined. Factors that can bring on syncope include:  Taking medicines that lower blood pressure.  Sudden changes in posture, such as standing up quickly.  Taking more medicine than prescribed.  Standing in one place for too long.  Seizure disorders.  Dehydration and excessive exposure to heat.  Low blood sugar (hypoglycemia).  Straining to have a bowel movement.  Heart disease, irregular heartbeat, or other circulatory problems.  Fear, emotional distress, seeing blood, or severe pain. SYMPTOMS  Right before fainting, you may:  Feel dizzy or light-headed.  Feel nauseous.  See all white or all black in your field of vision.  Have cold, clammy skin. DIAGNOSIS  Your health care provider will ask about your symptoms, perform a physical exam, and perform an electrocardiogram (ECG) to record the electrical activity of your heart. Your health care provider may also perform other heart or blood tests to determine the cause of your syncope which may include:  Transthoracic echocardiogram (TTE). During echocardiography, sound waves are used to evaluate how blood flows through your heart.  Transesophageal echocardiogram (TEE).  Cardiac monitoring. This allows your health care provider to monitor your  heart rate and rhythm in real time.  Holter monitor. This is a portable device that records your heartbeat and can help diagnose heart arrhythmias. It allows your health care provider to track your heart activity for several days, if needed.  Stress tests by exercise or by giving medicine that makes the heart beat faster. TREATMENT  In most cases, no treatment is needed. Depending on the cause of your syncope, your health care provider may recommend changing or stopping some of your medicines. HOME CARE INSTRUCTIONS  Have someone stay with you until you feel stable.  Do not drive, use machinery, or play sports until your health care provider says it is okay.  Keep all follow-up appointments as directed by your health care provider.  Lie down right away if you start feeling like you might faint. Breathe deeply and steadily. Wait until all the symptoms have passed.  Drink enough fluids to keep your urine clear or pale yellow.  If you are taking blood pressure or heart medicine, get up slowly and take several minutes to sit and then stand. This can reduce dizziness. SEEK IMMEDIATE MEDICAL CARE IF:   You have a severe headache.  You have unusual pain in the chest, abdomen, or back.  You are bleeding from your mouth or rectum, or you have black or tarry stool.  You have an irregular or very fast heartbeat.  You have pain with breathing.  You have repeated fainting or seizure-like jerking during an episode.  You faint when sitting or lying down.  You have confusion.  You have trouble walking.  You have severe weakness.  You have vision problems. If you fainted, call your local emergency services (911 in [place] Do not drive yourself to the hospital.    This information is not intended to replace advice given to you by your health care provider. Make sure you discuss any questions you have with your health care provider.   Document Released: 03/04/2005 Document Revised:  07/19/2014 Document Reviewed: 05/03/2011 Elsevier Interactive Patient Education [employer].

## 2015-02-22 NOTE — ED Notes (Signed)
Pt ambulated in hallway without difficulty

## 2015-02-22 NOTE — ED Notes (Signed)
Pt requesting to leave, MD aware, waiting on urine to result, pt states she is not able to wait.  MD aware and will d/c pt.   Pt walked out without signing or discharge paperwork.  Ambulatory, NAD, VSS.

## 2015-02-22 NOTE — ED Provider Notes (Signed)
CSN: [id number]     Arrival date & time 02/22/15  0419 History   First MD Initiated Contact with Patient 02/22/15 0430     Chief Complaint  Patient presents with  . Headache     (Consider location/radiation/quality/duration/timing/severity/associated sxs/prior Treatment) HPI  This is a 24 year old female who presents following a syncopal episode. Patient reports that she was at work earlier this evening when she drank red bull.  Next she remembers is being woken up by a coworker.  She states that prior to passing out she felt hot and flushed. No history of syncope.  Denies any chest pain or shortness of breath. Denies any abdominal pain. No history of diabetes. Blood sugar checked by nursing and 64. Patient was given by mouth fluids. She does report a occipital headache from falling. Denies any weakness, numbness, tingling. Denies any vomiting. Reports that she feels like she has been well hydrated.  Otherwise currently is asymptomatic.  Past Medical History  Diagnosis Date  . UTI (lower urinary tract infection)   . Headache(784.0)     chronic  . Gallstones   . Hypertension     GHTN with last pg  . Pregnancy induced hypertension   . Anemia   . Kidney stones   . Ovarian cyst    Past Surgical History  Procedure Laterality Date  . Cholecystectomy N/A 07/27/2012    Procedure: LAPAROSCOPIC CHOLECYSTECTOMY WITH INTRAOPERATIVE CHOLANGIOGRAM;  Surgeon: Madilyn Hook, DO;  Location: [facility];  Service: General;  Laterality: N/A;  . Appendectomy    . Laparoscopic appendectomy N/A 12/09/2012    Procedure: APPENDECTOMY LAPAROSCOPIC;  Surgeon: Gwenyth Ober, MD;  Location: [place];  Service: General;  Laterality: N/A;  . Diagnostic laparoscopy with removal of ectopic pregnancy N/A 12/27/2014    Procedure: DIAGNOSTIC LAPAROSCOPY, Right Salpingectomy with removal ECTOPIC PREGNANCY;  Surgeon: [name], MD;  Location: [facility];  Service: Gynecology;  Laterality: N/A;   Family History  Problem  Relation Age of Onset  . Asthma Mother   . Hypertension Mother   . Parkinsonism Mother   . Diabetes Maternal Grandmother   . Hypertension Maternal Grandmother   . Stroke Maternal Grandmother   . Heart disease Maternal Grandmother   . Anesthesia problems Neg Hx   . Malignant hyperthermia Neg Hx   . Pseudochol deficiency Neg Hx   . Hypotension Neg Hx   . Asthma Brother   . Lupus Maternal Aunt   . Heart disease Maternal Grandfather    Social History  Substance Use Topics  . Smoking status: Former Smoker    Quit date: 12/18/2011  . Smokeless tobacco: Never Used     Comment: 2014  . Alcohol Use: 0.0 oz/week     Comment: social   OB History    Gravida Para Term Preterm AB TAB SAB Ectopic Multiple Living   3 2 1 1  0 0 0 0 0 2     Review of Systems  Constitutional: Negative for fever.  Respiratory: Negative for shortness of breath.   Cardiovascular: Negative for chest pain and leg swelling.  Gastrointestinal: Negative for nausea, vomiting and abdominal pain.  Genitourinary: Negative for dysuria.  Neurological: Positive for syncope and numbness. Negative for dizziness and light-headedness.  All other systems reviewed and are negative.     Allergies  Amoxicillin; Contrast media; and Dilaudid  Home Medications   Prior to Admission medications   Medication Sig Start Date End Date Taking? Authorizing Provider  clindamycin (CLEOCIN) 150 MG capsule Take  2 capsules (300 mg total) by mouth 3 (three) times daily. May dispense as 150mg  capsules Patient not taking: Reported on 02/22/2015 01/02/15   Alvina Chou, PA-C  ibuprofen (ADVIL,MOTRIN) 600 MG tablet Take 1 tablet (600 mg total) by mouth every 6 (six) hours as needed. Patient not taking: Reported on 02/22/2015 12/27/14   Luvenia Redden, PA-C  oxyCODONE-acetaminophen (PERCOCET/ROXICET) 5-325 MG tablet Take 1-2 tablets by mouth every 6 (six) hours as needed. Patient not taking: Reported on 02/22/2015 12/27/14   [name],  MD   BP 124/85 mmHg  Pulse 81  Temp(Src) 98.5 F (36.9 C) (Oral)  Resp 21  SpO2 99%  LMP 01/30/2015 Physical Exam  Constitutional: She is oriented to person, place, and time. She appears well-developed and well-nourished. No distress.  HENT:  Head: Normocephalic and atraumatic.  Tenderness palpation over the occiput without obvious contusion or hematoma  Eyes: EOM are normal. Pupils are equal, round, and reactive to light.  Neck: Normal range of motion. Neck supple.  No midline tenderness, step-off, or deformity  Cardiovascular: Normal rate, regular rhythm and normal heart sounds.   No murmur heard. Pulmonary/Chest: Effort normal and breath sounds normal. No respiratory distress. She has no wheezes.  Abdominal: Soft. Bowel sounds are normal. There is no tenderness. There is no rebound.  Neurological: She is alert and oriented to person, place, and time.  Cranial nerves II through XII intact, 5 out of 5 strength in all 4 extremities, no dysmetria to finger-nose-finger  Skin: Skin is warm and dry.  Psychiatric: She has a normal mood and affect.  Nursing note and vitals reviewed.   ED Course  Procedures (including critical care time) Labs Review Labs Reviewed  BASIC METABOLIC PANEL - Abnormal; Notable for the following:    Potassium 3.4 (*)    All other components within normal limits  CBC - Abnormal; Notable for the following:    Hemoglobin 11.9 (*)    All other components within normal limits  URINALYSIS, ROUTINE W REFLEX MICROSCOPIC (NOT AT [facility]) - Abnormal; Notable for the following:    APPearance CLOUDY (*)    Hgb urine dipstick LARGE (*)    Ketones, ur 15 (*)    All other components within normal limits  URINE MICROSCOPIC-ADD ON - Abnormal; Notable for the following:    Squamous Epithelial / LPF 0-5 (*)    Bacteria, UA MANY (*)    All other components within normal limits  CBG MONITORING, ED - Abnormal; Notable for the following:    Glucose-Capillary 64 (*)    All  other components within normal limits  I-STAT CHEM 8, ED - Abnormal; Notable for the following:    Potassium 3.3 (*)    All other components within normal limits  PREGNANCY, URINE  CBG MONITORING, ED    Imaging Review Ct Head Wo Contrast  02/22/2015  CLINICAL DATA:  Initial evaluation for acute syncope, fall. EXAM: CT HEAD WITHOUT CONTRAST TECHNIQUE: Contiguous axial images were obtained from the base of the skull through the vertex without intravenous contrast. COMPARISON:  Prior study from 01/18/2014. FINDINGS: There is no acute intracranial hemorrhage or infarct. No mass lesion or midline shift. Gray-white matter differentiation is well maintained. Ventricles are normal in size without evidence of hydrocephalus. CSF containing spaces are within normal limits. No extra-axial fluid collection. The calvarium is intact. Orbital soft tissues are within normal limits. The paranasal sinuses and mastoid air cells are well pneumatized and free of fluid. Scalp soft tissues are  unremarkable. IMPRESSION: Negative head CT with no acute intracranial process identified. Electronically Signed   By: Jeannine Boga M.D.   On: 02/22/2015 06:29   I have personally reviewed and evaluated these images and lab results as part of my medical decision-making.   EKG Interpretation   Date/Time:  Wednesday February 22 2015 04:29:44 EST Ventricular Rate:  86 PR Interval:  168 QRS Duration: 71 QT Interval:  369 QTC Calculation: 441 R Axis:   49 Text Interpretation:  Sinus rhythm Borderline T wave abnormalities  Confirmed by Edelyn Heidel  MD, Gabrial Poppell ([id number]) on 02/22/2015 6:00:54 AM      MDM   Final diagnoses:  Syncope, unspecified syncope type  Hypoglycemia    Patient presents with syncopal episode. Nontoxic on exam. Neurologically intact. Reports minor headache.  Patient did feel hot and flushed prior to syncopal event. Also noted to have a blood sugar 64. No history of hypoglycemia or syncope. EKG is  reassuring without evidence of arrhythmia. Head CT is negative for traumatic injury. Basic lab work is largely reassuring. On recheck, patient continues to be asymptomatic with a reassuring exam. While awaiting urinalysis to evaluate for UTI and pregnancy, patient stated that she needed to leave. She was able to ambulate independently. I have reviewed these results after patient discharge and they are reassuring. Patient was given follow-up at [facility].  After history, exam, and medical workup I feel the patient has been appropriately medically screened and is safe for discharge home. Pertinent diagnoses were discussed with the patient. Patient was given return precautions.    Merryl Hacker, MD 02/22/15 2322

## 2015-04-04 ENCOUNTER — Emergency Department (HOSPITAL_COMMUNITY)
Admission: EM | Admit: 2015-04-04 | Discharge: 2015-04-05 | Disposition: A | Discharge status: Home / Self Care | Payer: Self-pay | Attending: Emergency Medicine | Admitting: Emergency Medicine

## 2015-04-04 ENCOUNTER — Encounter (HOSPITAL_COMMUNITY): Payer: Self-pay | Admitting: Emergency Medicine

## 2015-04-04 DIAGNOSIS — N644 Mastodynia: Secondary | ICD-10-CM | POA: Insufficient documentation

## 2015-04-04 DIAGNOSIS — Z880 Allergy status to penicillin: Secondary | ICD-10-CM | POA: Insufficient documentation

## 2015-04-04 DIAGNOSIS — Z87891 Personal history of nicotine dependence: Secondary | ICD-10-CM | POA: Insufficient documentation

## 2015-04-04 DIAGNOSIS — Z8719 Personal history of other diseases of the digestive system: Secondary | ICD-10-CM | POA: Insufficient documentation

## 2015-04-04 DIAGNOSIS — Z862 Personal history of diseases of the blood and blood-forming organs and certain disorders involving the immune mechanism: Secondary | ICD-10-CM | POA: Insufficient documentation

## 2015-04-04 DIAGNOSIS — Z87442 Personal history of urinary calculi: Secondary | ICD-10-CM | POA: Insufficient documentation

## 2015-04-04 DIAGNOSIS — G8929 Other chronic pain: Secondary | ICD-10-CM | POA: Insufficient documentation

## 2015-04-04 DIAGNOSIS — Z87440 Personal history of urinary (tract) infections: Secondary | ICD-10-CM | POA: Insufficient documentation

## 2015-04-04 DIAGNOSIS — I10 Essential (primary) hypertension: Secondary | ICD-10-CM | POA: Insufficient documentation

## 2015-04-04 NOTE — ED Notes (Signed)
Patient presents with pain in both breasts x2 weeks. Denies fever, chills, changes in nipples or breasts, denies drainage or injury to same.

## 2015-04-05 MED ORDER — TRAMADOL HCL 50 MG PO TABS: 50.0000 mg | ORAL_TABLET | Freq: Four times a day (QID) | ORAL | Status: DC | PRN

## 2015-04-05 NOTE — Discharge Instructions (Signed)

## 2015-04-05 NOTE — ED Provider Notes (Signed)
CSN: [id number]     Arrival date & time 04/04/15  2210 History   First MD Initiated Contact with Patient 04/04/15 2316     Chief Complaint  Patient presents with  . Breast Pain     (Consider location/radiation/quality/duration/timing/severity/associated sxs/prior Treatment) HPI Comments: The patient presents for evaluation of bilateral breast pain for 2 weeks. The pain is constant and affects the upper portion of both breasts. No redness, ulcerations or sores. No masses, nipple discharge or nipple bleeding. The patient reports taking multiple pregnancy tests at home and reports all are negative. No fever.  The history is provided by the patient. No language interpreter was used.    Past Medical History  Diagnosis Date  . UTI (lower urinary tract infection)   . Headache(784.0)     chronic  . Gallstones   . Hypertension     GHTN with last pg  . Pregnancy induced hypertension   . Anemia   . Kidney stones   . Ovarian cyst    Past Surgical History  Procedure Laterality Date  . Cholecystectomy N/A 07/27/2012    Procedure: LAPAROSCOPIC CHOLECYSTECTOMY WITH INTRAOPERATIVE CHOLANGIOGRAM;  Surgeon: Madilyn Hook, DO;  Location: [facility];  Service: General;  Laterality: N/A;  . Appendectomy    . Laparoscopic appendectomy N/A 12/09/2012    Procedure: APPENDECTOMY LAPAROSCOPIC;  Surgeon: Gwenyth Ober, MD;  Location: [place];  Service: General;  Laterality: N/A;  . Diagnostic laparoscopy with removal of ectopic pregnancy N/A 12/27/2014    Procedure: DIAGNOSTIC LAPAROSCOPY, Right Salpingectomy with removal ECTOPIC PREGNANCY;  Surgeon: [name], MD;  Location: [facility];  Service: Gynecology;  Laterality: N/A;   Family History  Problem Relation Age of Onset  . Asthma Mother   . Hypertension Mother   . Parkinsonism Mother   . Diabetes Maternal Grandmother   . Hypertension Maternal Grandmother   . Stroke Maternal Grandmother   . Heart disease Maternal Grandmother   . Anesthesia problems Neg  Hx   . Malignant hyperthermia Neg Hx   . Pseudochol deficiency Neg Hx   . Hypotension Neg Hx   . Asthma Brother   . Lupus Maternal Aunt   . Heart disease Maternal Grandfather    Social History  Substance Use Topics  . Smoking status: Former Smoker    Quit date: 12/18/2011  . Smokeless tobacco: Never Used     Comment: 2014  . Alcohol Use: 0.0 oz/week     Comment: social   OB History    Gravida Para Term Preterm AB TAB SAB Ectopic Multiple Living   3 2 1 1  0 0 0 0 0 2     Review of Systems  Constitutional: Negative for fever.  Cardiovascular:       Bilateral breast pain  Gastrointestinal: Negative for nausea and abdominal pain.  Genitourinary: Negative for vaginal discharge, vaginal pain and menstrual problem.  Skin: Negative for color change and wound.      Allergies  Amoxicillin; Contrast media; and Dilaudid  Home Medications   Prior to Admission medications   Medication Sig Start Date End Date Taking? Authorizing Provider  acetaminophen (TYLENOL) 500 MG tablet Take 1,000 mg by mouth every 6 (six) hours as needed for mild pain.   Yes Historical Provider, MD  ibuprofen (ADVIL,MOTRIN) 200 MG tablet Take 400 mg by mouth every 6 (six) hours as needed for moderate pain.   Yes Historical Provider, MD  clindamycin (CLEOCIN) 150 MG capsule Take 2 capsules (300 mg total) by mouth  3 (three) times daily. May dispense as 150mg  capsules Patient not taking: Reported on 02/22/2015 01/02/15   Alvina Chou, PA-C  ibuprofen (ADVIL,MOTRIN) 600 MG tablet Take 1 tablet (600 mg total) by mouth every 6 (six) hours as needed. Patient not taking: Reported on 02/22/2015 12/27/14   Luvenia Redden, PA-C  oxyCODONE-acetaminophen (PERCOCET/ROXICET) 5-325 MG tablet Take 1-2 tablets by mouth every 6 (six) hours as needed. Patient not taking: Reported on 02/22/2015 12/27/14   [name], MD  traMADol (ULTRAM) 50 MG tablet Take 1 tablet (50 mg total) by mouth every 6 (six) hours as needed. 04/05/15    Charlann Lange, PA-C   BP 111/69 mmHg  Pulse 98  Temp(Src) 99.1 F (37.3 C) (Oral)  Resp 20  SpO2 100%  LMP 10/18/2014 Physical Exam  Constitutional: She is oriented to person, place, and time. She appears well-developed and well-nourished.  Neck: Normal range of motion.  Pulmonary/Chest: Effort normal.  Large symmetric breasts. No redness. No palpable mass. Nipples bilaterally unremarkable without discharge, swelling or bleeding. No axillary lymphadenopathy. Mildly tender to palpation of upper portion of bilateral breasts.  Neurological: She is alert and oriented to person, place, and time.  Skin: Skin is warm and dry.    ED Course  Procedures (including critical care time) Labs Review Labs Reviewed - No data to display  Imaging Review No results found. I have personally reviewed and evaluated these images and lab results as part of my medical decision-making.   EKG Interpretation None      MDM   Final diagnoses:  Breast pain    Patient has no objective finding to explain breast pain. Will refer to GYN for further outpatient evaluation and management.     Charlann Lange, PA-C 04/05/15 0023  Quintella Reichert, MD 04/06/15 2017

## 2015-04-12 ENCOUNTER — Emergency Department (HOSPITAL_COMMUNITY)
Admission: EM | Admit: 2015-04-12 | Discharge: 2015-04-12 | Disposition: A | Discharge status: Home / Self Care | Payer: Self-pay | Attending: Emergency Medicine | Admitting: Emergency Medicine

## 2015-04-12 ENCOUNTER — Encounter (HOSPITAL_COMMUNITY): Payer: Self-pay

## 2015-04-12 DIAGNOSIS — I10 Essential (primary) hypertension: Secondary | ICD-10-CM | POA: Insufficient documentation

## 2015-04-12 DIAGNOSIS — K029 Dental caries, unspecified: Secondary | ICD-10-CM | POA: Insufficient documentation

## 2015-04-12 DIAGNOSIS — K0889 Other specified disorders of teeth and supporting structures: Secondary | ICD-10-CM | POA: Insufficient documentation

## 2015-04-12 DIAGNOSIS — Z87442 Personal history of urinary calculi: Secondary | ICD-10-CM | POA: Insufficient documentation

## 2015-04-12 DIAGNOSIS — K0381 Cracked tooth: Secondary | ICD-10-CM | POA: Insufficient documentation

## 2015-04-12 DIAGNOSIS — R110 Nausea: Secondary | ICD-10-CM | POA: Insufficient documentation

## 2015-04-12 DIAGNOSIS — Z87891 Personal history of nicotine dependence: Secondary | ICD-10-CM | POA: Insufficient documentation

## 2015-04-12 DIAGNOSIS — G8929 Other chronic pain: Secondary | ICD-10-CM | POA: Insufficient documentation

## 2015-04-12 DIAGNOSIS — Z87440 Personal history of urinary (tract) infections: Secondary | ICD-10-CM | POA: Insufficient documentation

## 2015-04-12 DIAGNOSIS — Z880 Allergy status to penicillin: Secondary | ICD-10-CM | POA: Insufficient documentation

## 2015-04-12 DIAGNOSIS — Z862 Personal history of diseases of the blood and blood-forming organs and certain disorders involving the immune mechanism: Secondary | ICD-10-CM | POA: Insufficient documentation

## 2015-04-12 DIAGNOSIS — Z8742 Personal history of other diseases of the female genital tract: Secondary | ICD-10-CM | POA: Insufficient documentation

## 2015-04-12 DIAGNOSIS — R51 Headache: Secondary | ICD-10-CM | POA: Insufficient documentation

## 2015-04-12 MED ORDER — BUPIVACAINE-EPINEPHRINE (PF) 0.5% -1:200000 IJ SOLN
1.8000 mL | Freq: Once | INTRAMUSCULAR | Status: AC
  Administered 2015-04-12: 1.8 mL

## 2015-04-12 NOTE — ED Notes (Signed)
Declined W/C at D/C and was escorted to lobby by RN. 

## 2015-04-12 NOTE — Discharge Instructions (Signed)
[employer]  °[employer]  °[facility]  °[address]  °[place], [place] [address]  °Phone [phone]  °[facility] [employer] [facility] [place], , exemplifies the Dental School’s vision to improve the health and quality of life of all [place]ians by creating leaders with a passion to care for the underserved and by leading the nation in community-based, service learning oral health education. °We are committed to offering comprehensive general dental services for adults, children and special needs patients in a safe, caring and professional setting. ° °Appointments: Our clinic is open Monday through Friday 8:00 a.m. until 5:00 p.m. The amount of time scheduled for an appointment depends on the patient’s specific needs. We ask that you keep your appointed time for care or provide 24-hour notice of all appointment changes. Parents or legal guardians must accompany minor children. ° °Payment for Services: Medicaid and other insurance plans are welcome. Payment for services is due when services are rendered and may be made by cash or credit card. If you have dental insurance, we will assist you with your claim submission.  ° °Emergencies: Emergency services will be provided Monday through Friday on a walk-in basis. Please arrive early for emergency services. After hours emergency services will be provided for patients of record as required. ° °Services:  °Comprehensive General Dentistry  °Children’s Dentistry  °Oral Surgery - Extractions  °Root Canals  °Sealants and Tooth Colored Fillings  °Crowns and Bridges  °Dentures and Partial Dentures  °Implant Services  °Periodontal Services and Cleanings  °Cosmetic Tooth Whitening  °Digital Radiography  °3-D/[facility] Beam Imaging ° ° °

## 2015-04-12 NOTE — ED Notes (Signed)
Pt. Presents with complaint of R lower dental pain x3 days. Pt. States she feels the tooth is coming out.

## 2015-04-12 NOTE — ED Provider Notes (Signed)
CSN: [id number]     Arrival date & time 04/12/15  1020 History  By signing my name below, I, Hilda Lias, attest that this documentation has been prepared under the direction and in the presence of [employer], PA-C.  Electronically Signed: Hilda Lias, ED Scribe. 04/12/2015. 11:36 AM.    Chief Complaint  Patient presents with  . Dental Pain      Patient is a 25 y.o. female presenting with tooth pain. The history is provided by the patient. No language interpreter was used.  Dental Pain   HPI Comments: Emma Chambers is a 25 y.o. female who presents to the Emergency Department complaining of constant, worsening right-sided mandibular dental pain that has been present for 3 days. Pt states she was eating three days ago and felt her tooth break, and states she found a piece of of her tooth in her food when she spit it out. Pt states she doesn't have a dentist and has never seen one before, and reports she feels like her tooth is loose and is about to fall out. Pt also reports having associated intermittent headaches and nausea due to her dental pain.    Past Medical History  Diagnosis Date  . UTI (lower urinary tract infection)   . Headache(784.0)     chronic  . Gallstones   . Hypertension     GHTN with last pg  . Pregnancy induced hypertension   . Anemia   . Kidney stones   . Ovarian cyst    Past Surgical History  Procedure Laterality Date  . Cholecystectomy N/A 07/27/2012    Procedure: LAPAROSCOPIC CHOLECYSTECTOMY WITH INTRAOPERATIVE CHOLANGIOGRAM;  Surgeon: Madilyn Hook, DO;  Location: [facility];  Service: General;  Laterality: N/A;  . Appendectomy    . Laparoscopic appendectomy N/A 12/09/2012    Procedure: APPENDECTOMY LAPAROSCOPIC;  Surgeon: Gwenyth Ober, MD;  Location: [place];  Service: General;  Laterality: N/A;  . Diagnostic laparoscopy with removal of ectopic pregnancy N/A 12/27/2014    Procedure: DIAGNOSTIC LAPAROSCOPY, Right Salpingectomy with removal ECTOPIC  PREGNANCY;  Surgeon: [name], MD;  Location: [facility];  Service: Gynecology;  Laterality: N/A;   Family History  Problem Relation Age of Onset  . Asthma Mother   . Hypertension Mother   . Parkinsonism Mother   . Diabetes Maternal Grandmother   . Hypertension Maternal Grandmother   . Stroke Maternal Grandmother   . Heart disease Maternal Grandmother   . Anesthesia problems Neg Hx   . Malignant hyperthermia Neg Hx   . Pseudochol deficiency Neg Hx   . Hypotension Neg Hx   . Asthma Brother   . Lupus Maternal Aunt   . Heart disease Maternal Grandfather    Social History  Substance Use Topics  . Smoking status: Former Smoker    Quit date: 12/18/2011  . Smokeless tobacco: Never Used     Comment: 2014  . Alcohol Use: 0.0 oz/week     Comment: social   OB History    Gravida Para Term Preterm AB TAB SAB Ectopic Multiple Living   3 2 1 1  0 0 0 0 0 2     Review of Systems  A complete 10 system review of systems was obtained and all systems are negative except as noted in the HPI and PMH.    Allergies  Amoxicillin; Contrast media; and Dilaudid  Home Medications   Prior to Admission medications   Medication Sig Start Date End Date Taking? Authorizing Provider  acetaminophen (TYLENOL) 500 MG tablet Take 1,000 mg by mouth every 6 (six) hours as needed for mild pain.    Historical Provider, MD  clindamycin (CLEOCIN) 150 MG capsule Take 2 capsules (300 mg total) by mouth 3 (three) times daily. May dispense as 150mg  capsules Patient not taking: Reported on 02/22/2015 01/02/15   Alvina Chou, PA-C  ibuprofen (ADVIL,MOTRIN) 200 MG tablet Take 400 mg by mouth every 6 (six) hours as needed for moderate pain.    Historical Provider, MD  ibuprofen (ADVIL,MOTRIN) 600 MG tablet Take 1 tablet (600 mg total) by mouth every 6 (six) hours as needed. Patient not taking: Reported on 02/22/2015 12/27/14   Luvenia Redden, PA-C  oxyCODONE-acetaminophen (PERCOCET/ROXICET) 5-325 MG tablet Take 1-2  tablets by mouth every 6 (six) hours as needed. Patient not taking: Reported on 02/22/2015 12/27/14   [name], MD  traMADol (ULTRAM) 50 MG tablet Take 1 tablet (50 mg total) by mouth every 6 (six) hours as needed. 04/05/15   Shari Upstill, PA-C   BP 104/55 mmHg  Pulse 80  Temp(Src) 99.1 F (37.3 C)  Resp 16  SpO2 100%  LMP 03/17/2015 Physical Exam  Constitutional: She is oriented to person, place, and time. She appears well-developed and well-nourished. No distress.  HENT:  Head: Normocephalic.  Mouth/Throat: Uvula is midline, oropharynx is clear and moist and mucous membranes are normal. No oropharyngeal exudate, posterior oropharyngeal edema, posterior oropharyngeal erythema or tonsillar abscesses.  First molar fractured with dental caries throughout Poor dentition  External exam shows no asymmetry of the jaw line or face, no signs of obvious swelling, edema, infection. Full active range of motion of the jaw. Neck is supple with full active range of motion, no tenderness to palpation of the soft tissues  Gumline palpated no obvious signs of infection including warmth, redness, abscess, tenderness. Posterior oropharynx clear with no signs of infection, uvula is midline and rises with phonation, tonsils present and normal in size, symmetrical bilateral, tongue is normal soft touch with full active range of motion, floor mouth is soft nontender.  Eyes: Conjunctivae are normal. Pupils are equal, round, and reactive to light. Right eye exhibits no discharge. Left eye exhibits no discharge.  Neck: Normal range of motion. Neck supple. No JVD present. No tracheal deviation present. No thyromegaly present.  Pulmonary/Chest: No stridor.  Lymphadenopathy:    She has no cervical adenopathy.  Neurological: She is alert and oriented to person, place, and time.  Skin: Skin is warm and dry. No rash noted. She is not diaphoretic. No erythema. No pallor.  Psychiatric: She has a normal mood and  affect. Her behavior is normal. Judgment and thought content normal.  Nursing note and vitals reviewed.   ED Course  Procedures (including critical care time)  DIAGNOSTIC STUDIES: Oxygen Saturation is 100% on room air, normal by my interpretation.    COORDINATION OF CARE: 11:04 AM Discussed treatment plan with pt at bedside and pt agreed to plan.    MDM   Final diagnoses:  Pain, dental  Labs:  Imaging:  Consults:  Therapeutics:  Discharge Meds:   Assessment/Plan: Patient presents with uncomplicated dental pain, no signs of infection. Patient will be referred to dentist, return precautions given.      I personally performed the services described in this documentation, which was scribed in my presence. The recorded information has been reviewed and is accurate.    Okey Regal, PA-C 04/12/15 1438  Davonna Belling, MD 04/13/15 [phone]

## 2015-04-18 ENCOUNTER — Emergency Department (HOSPITAL_COMMUNITY)
Admission: EM | Admit: 2015-04-18 | Discharge: 2015-04-18 | Disposition: A | Discharge status: Home / Self Care | Payer: Self-pay | Attending: Emergency Medicine | Admitting: Emergency Medicine

## 2015-04-18 ENCOUNTER — Encounter (HOSPITAL_COMMUNITY): Payer: Self-pay | Admitting: Emergency Medicine

## 2015-04-18 DIAGNOSIS — Z862 Personal history of diseases of the blood and blood-forming organs and certain disorders involving the immune mechanism: Secondary | ICD-10-CM | POA: Insufficient documentation

## 2015-04-18 DIAGNOSIS — Z8742 Personal history of other diseases of the female genital tract: Secondary | ICD-10-CM | POA: Insufficient documentation

## 2015-04-18 DIAGNOSIS — Z87891 Personal history of nicotine dependence: Secondary | ICD-10-CM | POA: Insufficient documentation

## 2015-04-18 DIAGNOSIS — K0889 Other specified disorders of teeth and supporting structures: Secondary | ICD-10-CM | POA: Insufficient documentation

## 2015-04-18 DIAGNOSIS — Z87442 Personal history of urinary calculi: Secondary | ICD-10-CM | POA: Insufficient documentation

## 2015-04-18 DIAGNOSIS — Z8719 Personal history of other diseases of the digestive system: Secondary | ICD-10-CM | POA: Insufficient documentation

## 2015-04-18 DIAGNOSIS — Z87440 Personal history of urinary (tract) infections: Secondary | ICD-10-CM | POA: Insufficient documentation

## 2015-04-18 DIAGNOSIS — Z880 Allergy status to penicillin: Secondary | ICD-10-CM | POA: Insufficient documentation

## 2015-04-18 DIAGNOSIS — R509 Fever, unspecified: Secondary | ICD-10-CM | POA: Insufficient documentation

## 2015-04-18 DIAGNOSIS — I10 Essential (primary) hypertension: Secondary | ICD-10-CM | POA: Insufficient documentation

## 2015-04-18 MED ORDER — CLINDAMYCIN HCL 150 MG PO CAPS: 150.0000 mg | ORAL_CAPSULE | Freq: Four times a day (QID) | ORAL | Status: DC

## 2015-04-18 MED ORDER — IBUPROFEN 800 MG PO TABS: 800.0000 mg | ORAL_TABLET | Freq: Three times a day (TID) | ORAL | Status: DC

## 2015-04-18 NOTE — Discharge Instructions (Signed)
Take your medications as prescribed. I also recommend applying ice to affected region for 15-20 minutes 3-4 times daily for pain relief. Follow-up with your dentist at your scheduled appointment on 04/21/15. Return to the emergency department if symptoms worsen or new onset of fever, facial/neck swelling, difficulty breathing, difficulty swallowing resulting in drooling, voice change, unable to tolerate fluids.

## 2015-04-18 NOTE — ED Provider Notes (Signed)
CSN: [id number]     Arrival date & time 04/18/15  [id number] History   First MD Initiated Contact with Patient 04/18/15 [phone]     Chief Complaint  Patient presents with  . Dental Pain     (Consider location/radiation/quality/duration/timing/severity/associated sxs/prior Treatment) HPI   Patient is a 25 year old female who presents to the ED with complaint of constant worsening right sided mandibular dental pain. Patient reports she was seen in the ED on 04/12/15 initially, was given a dental block in the ED and resources to follow up with a dentist. She notes she scheduled appointment with a dental clinic 42/3/17. Patient reports due to her tooth bothering her over the past few days she pulled her tooth out at home using pliers 3 days ago. Patient endorses having small amount of bleeding and draining pus status post extraction. She endorses having worsening pain to her right lower mandible and upper neck. Endorses associated fever, ear pain and nausea. Denies dysphasia, drooling, change in voice, headache, vomiting, abdominal pain, chest pain, numbness, tingling, weakness. She reports she has been taking Tylenol and using a heating pad at home with mild relief.  Past Medical History  Diagnosis Date  . UTI (lower urinary tract infection)   . Headache(784.0)     chronic  . Gallstones   . Hypertension     GHTN with last pg  . Pregnancy induced hypertension   . Anemia   . Kidney stones   . Ovarian cyst    Past Surgical History  Procedure Laterality Date  . Cholecystectomy N/A 07/27/2012    Procedure: LAPAROSCOPIC CHOLECYSTECTOMY WITH INTRAOPERATIVE CHOLANGIOGRAM;  Surgeon: Madilyn Hook, DO;  Location: [facility];  Service: General;  Laterality: N/A;  . Appendectomy    . Laparoscopic appendectomy N/A 12/09/2012    Procedure: APPENDECTOMY LAPAROSCOPIC;  Surgeon: Gwenyth Ober, MD;  Location: [place];  Service: General;  Laterality: N/A;  . Diagnostic laparoscopy with removal of ectopic pregnancy N/A  12/27/2014    Procedure: DIAGNOSTIC LAPAROSCOPY, Right Salpingectomy with removal ECTOPIC PREGNANCY;  Surgeon: [name], MD;  Location: [facility];  Service: Gynecology;  Laterality: N/A;   Family History  Problem Relation Age of Onset  . Asthma Mother   . Hypertension Mother   . Parkinsonism Mother   . Diabetes Maternal Grandmother   . Hypertension Maternal Grandmother   . Stroke Maternal Grandmother   . Heart disease Maternal Grandmother   . Anesthesia problems Neg Hx   . Malignant hyperthermia Neg Hx   . Pseudochol deficiency Neg Hx   . Hypotension Neg Hx   . Asthma Brother   . Lupus Maternal Aunt   . Heart disease Maternal Grandfather    Social History  Substance Use Topics  . Smoking status: Former Smoker    Quit date: 12/18/2011  . Smokeless tobacco: Never Used     Comment: 2014  . Alcohol Use: 0.0 oz/week     Comment: social   OB History    Gravida Para Term Preterm AB TAB SAB Ectopic Multiple Living   3 2 1 1  0 0 0 0 0 2     Review of Systems  Constitutional: Positive for fever.  HENT: Positive for dental problem, ear pain and facial swelling. Negative for congestion, sore throat and trouble swallowing.   Respiratory: Negative for shortness of breath.   Gastrointestinal: Positive for nausea. Negative for vomiting and abdominal pain.  Neurological: Negative for headaches.      Allergies  Amoxicillin; Contrast media; and  Dilaudid  Home Medications   Prior to Admission medications   Medication Sig Start Date End Date Taking? Authorizing Provider  acetaminophen (TYLENOL) 500 MG tablet Take 1,000 mg by mouth every 6 (six) hours as needed for mild pain.    Historical Provider, MD  clindamycin (CLEOCIN) 150 MG capsule Take 1 capsule (150 mg total) by mouth every 6 (six) hours. 04/18/15   Nona Dell, PA-C  ibuprofen (ADVIL,MOTRIN) 800 MG tablet Take 1 tablet (800 mg total) by mouth 3 (three) times daily. 04/18/15   Nona Dell, PA-C    oxyCODONE-acetaminophen (PERCOCET/ROXICET) 5-325 MG tablet Take 1-2 tablets by mouth every 6 (six) hours as needed. Patient not taking: Reported on 02/22/2015 12/27/14   [name], MD  traMADol (ULTRAM) 50 MG tablet Take 1 tablet (50 mg total) by mouth every 6 (six) hours as needed. 04/05/15   Shari Upstill, PA-C   BP 101/67 mmHg  Pulse 91  Temp(Src) 99.1 F (37.3 C)  Resp 18  SpO2 100%  LMP 03/17/2015 Physical Exam  Constitutional: She is oriented to person, place, and time. She appears well-developed and well-nourished. No distress.  HENT:  Head: Normocephalic and atraumatic.  Right Ear: Tympanic membrane normal.  Left Ear: Tympanic membrane normal.  Nose: Nose normal. Right sinus exhibits no maxillary sinus tenderness and no frontal sinus tenderness. Left sinus exhibits no maxillary sinus tenderness and no frontal sinus tenderness.  Mouth/Throat: Uvula is midline, oropharynx is clear and moist and mucous membranes are normal. No oral lesions. No trismus in the jaw. Abnormal dentition. No dental abscesses, uvula swelling or lacerations. No oropharyngeal exudate.    Eyes: Conjunctivae and EOM are normal. Right eye exhibits no discharge. Left eye exhibits no discharge. No scleral icterus.  Neck: Normal range of motion. Neck supple.  Cardiovascular: Normal rate.   Pulmonary/Chest: Effort normal. No respiratory distress.  Abdominal: Soft. Bowel sounds are normal. She exhibits no distension. There is no tenderness.  Musculoskeletal: Normal range of motion.  Lymphadenopathy:    She has no cervical adenopathy.  Neurological: She is alert and oriented to person, place, and time.  Nursing note and vitals reviewed.   ED Course  Procedures (including critical care time) Labs Review Labs Reviewed - No data to display  Imaging Review No results found. I have personally reviewed and evaluated these images and lab results as part of my medical decision-making.  Filed Vitals:    04/18/15 0735  BP: 101/67  Pulse: 91  Temp: 99.1 F (37.3 C)  Resp: 18     MDM   Final diagnoses:  Pain, dental    Patient presents with worsening right mandibular dental pain. She was seen in the ED on 04/12/15, given a dental block and discharged home with dental referral. Patient reports she out her affected tooth at home with pliers, endorses small amount of bleeding and.purulent drainage. VSS. Exam revealed TTP at gingiva around extracted tooth #30 with no evidence of abscess. No trismus, drooling, neck swelling or stridor on exam. Plan to discharge patient home with NSAIDs, antibiotics and symptomatic treatment. Advised patient to follow up with her dentist at her scheduled appointment in 3 days.  Evaluation does not show pathology requring ongoing emergent intervention or admission. Pt is hemodynamically stable and mentating appropriately. Discussed findings/results and plan with patient/guardian, who agrees with plan. All questions answered. Return precautions discussed and outpatient follow up given.      Chesley Noon [place], [place] 04/18/15 0800  Quintella Reichert, MD 04/18/15  0838 

## 2015-04-18 NOTE — ED Notes (Signed)
Patient states she was seen a few days ago for same.  Patient states she pulled the tooth that was bothering her and has an appointment with dentist 04/21/15.   Patient states thinks she has some infection.

## 2015-05-07 ENCOUNTER — Encounter (HOSPITAL_COMMUNITY): Payer: Self-pay | Admitting: *Deleted

## 2015-05-07 ENCOUNTER — Inpatient Hospital Stay (HOSPITAL_COMMUNITY)
Admission: AD | Admit: 2015-05-07 | Discharge: 2015-05-07 | Disposition: A | Discharge status: Home / Self Care | Payer: Self-pay | Source: Ambulatory Visit | Attending: Obstetrics and Gynecology | Admitting: Obstetrics and Gynecology

## 2015-05-07 DIAGNOSIS — Z87891 Personal history of nicotine dependence: Secondary | ICD-10-CM | POA: Insufficient documentation

## 2015-05-07 DIAGNOSIS — Z91041 Radiographic dye allergy status: Secondary | ICD-10-CM | POA: Insufficient documentation

## 2015-05-07 DIAGNOSIS — B9689 Other specified bacterial agents as the cause of diseases classified elsewhere: Secondary | ICD-10-CM | POA: Insufficient documentation

## 2015-05-07 DIAGNOSIS — M791 Myalgia: Secondary | ICD-10-CM | POA: Insufficient documentation

## 2015-05-07 DIAGNOSIS — N926 Irregular menstruation, unspecified: Secondary | ICD-10-CM

## 2015-05-07 DIAGNOSIS — A499 Bacterial infection, unspecified: Secondary | ICD-10-CM

## 2015-05-07 DIAGNOSIS — Z885 Allergy status to narcotic agent status: Secondary | ICD-10-CM | POA: Insufficient documentation

## 2015-05-07 DIAGNOSIS — M7918 Myalgia, other site: Secondary | ICD-10-CM

## 2015-05-07 DIAGNOSIS — N760 Acute vaginitis: Secondary | ICD-10-CM | POA: Insufficient documentation

## 2015-05-07 DIAGNOSIS — Z8249 Family history of ischemic heart disease and other diseases of the circulatory system: Secondary | ICD-10-CM | POA: Insufficient documentation

## 2015-05-07 DIAGNOSIS — Z3202 Encounter for pregnancy test, result negative: Secondary | ICD-10-CM | POA: Insufficient documentation

## 2015-05-07 LAB — URINALYSIS, ROUTINE W REFLEX MICROSCOPIC
Bilirubin Urine: NEGATIVE
Glucose, UA: NEGATIVE mg/dL
Ketones, ur: NEGATIVE mg/dL
Leukocytes, UA: NEGATIVE
Nitrite: NEGATIVE
Protein, ur: NEGATIVE mg/dL
Specific Gravity, Urine: >1.03 — ABNORMAL HIGH (ref 1.005–1.030)
pH: 5.5 (ref 5.0–8.0)

## 2015-05-07 LAB — URINE MICROSCOPIC-ADD ON: Bacteria, UA: NONE SEEN

## 2015-05-07 LAB — WET PREP, GENITAL
Sperm: NONE SEEN
Trich, Wet Prep: NONE SEEN
Yeast Wet Prep HPF POC: NONE SEEN

## 2015-05-07 LAB — POCT PREGNANCY, URINE: Preg Test, Ur: NEGATIVE

## 2015-05-07 MED ORDER — METRONIDAZOLE 500 MG PO TABS: 500.0000 mg | ORAL_TABLET | Freq: Two times a day (BID) | ORAL | Status: DC

## 2015-05-07 MED ORDER — IBUPROFEN 600 MG PO TABS
600.0000 mg | ORAL_TABLET | Freq: Once | ORAL | Status: AC
  Administered 2015-05-07: 600 mg via ORAL
  Filled 2015-05-07: qty 1

## 2015-05-07 NOTE — MAU Note (Signed)
Pt presents to MAU with complaints of pain in her lower abdomen and her hips. Denies any vaginal bleeding or abnormal discharge

## 2015-05-07 NOTE — Discharge Instructions (Signed)

## 2015-05-07 NOTE — MAU Note (Addendum)
Charted on wrong patient

## 2015-05-07 NOTE — MAU Provider Note (Signed)
History     CSN: [id number]  Arrival date and time: 05/07/15 [id number]   First Provider Initiated Contact with Patient 05/07/15 1018      Chief Complaint  Patient presents with  . Abdominal Pain  . Hip Pain   HPI   [name] is a 25 y.o. female [phone] presenting to MAU with bilateral lower abdominal pain and bilateral hip pain. The started a few days ago " I lift and bend a lot for my job." She hast tried tylenol for pain only; this has helped some.  The pain does not hurt with movement, however more when she is laying down.   She currently rates her pain 8/10; no recent pain medication Patient was hurting while at work and came in here.  Denies vaginal discharge.   Condoms always for pain No new sexual partners.   Menstrual cycle was supposed to come on at the end of Jan. And never came. Patient is concerned about pregnancy; has not taken a pregnancy test at home.   OB History    Gravida Para Term Preterm AB TAB SAB Ectopic Multiple Living   3 2 1 1  0 0 0 0 0 2      Past Medical History  Diagnosis Date  . UTI (lower urinary tract infection)   . Headache(784.0)     chronic  . Gallstones   . Hypertension     GHTN with last pg  . Pregnancy induced hypertension   . Anemia   . Kidney stones   . Ovarian cyst     Past Surgical History  Procedure Laterality Date  . Cholecystectomy N/A 07/27/2012    Procedure: LAPAROSCOPIC CHOLECYSTECTOMY WITH INTRAOPERATIVE CHOLANGIOGRAM;  Surgeon: Madilyn Hook, DO;  Location: [facility];  Service: General;  Laterality: N/A;  . Appendectomy    . Laparoscopic appendectomy N/A 12/09/2012    Procedure: APPENDECTOMY LAPAROSCOPIC;  Surgeon: Gwenyth Ober, MD;  Location: [facility];  Service: General;  Laterality: N/A;  . Diagnostic laparoscopy with removal of ectopic pregnancy N/A 12/27/2014    Procedure: DIAGNOSTIC LAPAROSCOPY, Right Salpingectomy with removal ECTOPIC PREGNANCY;  Surgeon: [name], MD;  Location: [facility];  Service:  Gynecology;  Laterality: N/A;    Family History  Problem Relation Age of Onset  . Asthma Mother   . Hypertension Mother   . Parkinsonism Mother   . Diabetes Maternal Grandmother   . Hypertension Maternal Grandmother   . Stroke Maternal Grandmother   . Heart disease Maternal Grandmother   . Anesthesia problems Neg Hx   . Malignant hyperthermia Neg Hx   . Pseudochol deficiency Neg Hx   . Hypotension Neg Hx   . Asthma Brother   . Lupus Maternal Aunt   . Heart disease Maternal Grandfather     Social History  Substance Use Topics  . Smoking status: Former Smoker    Quit date: 12/18/2011  . Smokeless tobacco: Never Used     Comment: 2014  . Alcohol Use: 0.0 oz/week     Comment: social    Allergies:  Allergies  Allergen Reactions  . Amoxicillin Nausea And Vomiting and Other (See Comments)    Tics & twitches Has patient had a PCN reaction causing immediate rash, facial/tongue/throat swelling, SOB or lightheadedness with hypotension: Yes Has patient had a PCN reaction causing severe rash involving mucus membranes or skin necrosis: No Has patient had a PCN reaction that required hospitalization No Has patient had a PCN reaction occurring within the last 10  years: Yes If all of the above answers are "NO", then may proceed with Cephalosporin use.  . Contrast Media [Iodinated Diagnostic Agents] Hives  . Dilaudid [Hydromorphone Hcl] Itching    Prescriptions prior to admission  Medication Sig Dispense Refill Last Dose  . acetaminophen (TYLENOL) 500 MG tablet Take 1,000 mg by mouth every 6 (six) hours as needed for mild pain.   prn  . clindamycin (CLEOCIN) 150 MG capsule Take 1 capsule (150 mg total) by mouth every 6 (six) hours. (Patient not taking: Reported on 05/07/2015) 28 capsule 0 Not Taking at Unknown time  . ibuprofen (ADVIL,MOTRIN) 800 MG tablet Take 1 tablet (800 mg total) by mouth 3 (three) times daily. (Patient not taking: Reported on 05/07/2015) 21 tablet 0 Not Taking at  Unknown time  . oxyCODONE-acetaminophen (PERCOCET/ROXICET) 5-325 MG tablet Take 1-2 tablets by mouth every 6 (six) hours as needed. (Patient not taking: Reported on 02/22/2015) 30 tablet 0 Not Taking at Unknown time  . traMADol (ULTRAM) 50 MG tablet Take 1 tablet (50 mg total) by mouth every 6 (six) hours as needed. (Patient not taking: Reported on 05/07/2015) 15 tablet 0 Not Taking at Unknown time   Results for orders placed or performed during the hospital encounter of 05/07/15 (from the past 48 hour(s))  Urinalysis, Routine w reflex microscopic (not at [facility])     Status: Abnormal   Collection Time: 05/07/15  9:00 AM  Result Value Ref Range   Color, Urine YELLOW YELLOW   APPearance CLEAR CLEAR   Specific Gravity, Urine >1.030 (H) 1.005 - 1.030   pH 5.5 5.0 - 8.0   Glucose, UA NEGATIVE NEGATIVE mg/dL   Hgb urine dipstick TRACE (A) NEGATIVE   Bilirubin Urine NEGATIVE NEGATIVE   Ketones, ur NEGATIVE NEGATIVE mg/dL   Protein, ur NEGATIVE NEGATIVE mg/dL   Nitrite NEGATIVE NEGATIVE   Leukocytes, UA NEGATIVE NEGATIVE  Urine microscopic-add on     Status: Abnormal   Collection Time: 05/07/15  9:00 AM  Result Value Ref Range   Squamous Epithelial / LPF 6-30 (A) NONE SEEN   WBC, UA 0-5 0 - 5 WBC/hpf   RBC / HPF 0-5 0 - 5 RBC/hpf   Bacteria, UA NONE SEEN NONE SEEN   Urine-Other MUCOUS PRESENT   Pregnancy, urine POC     Status: None   Collection Time: 05/07/15  9:35 AM  Result Value Ref Range   Preg Test, Ur NEGATIVE NEGATIVE    Comment:        THE SENSITIVITY OF THIS METHODOLOGY IS >24 mIU/mL   Wet prep, genital     Status: Abnormal   Collection Time: 05/07/15 10:25 AM  Result Value Ref Range   Yeast Wet Prep HPF POC NONE SEEN NONE SEEN   Trich, Wet Prep NONE SEEN NONE SEEN   Clue Cells Wet Prep HPF POC PRESENT (A) NONE SEEN   WBC, Wet Prep HPF POC FEW (A) NONE SEEN    Comment: FEW BACTERIA SEEN   Sperm NONE SEEN     Review of Systems  Constitutional: Negative for fever and chills.   Gastrointestinal: Positive for nausea, vomiting (One episode. ) and abdominal pain. Negative for diarrhea and constipation.  Genitourinary: Negative for dysuria.   Physical Exam   Blood pressure 110/67, pulse 88, temperature 98.2 F (36.8 C), resp. rate 18, last menstrual period 03/17/2015, unknown if currently breastfeeding.  Physical Exam  Constitutional: She is oriented to person, place, and time. She appears well-developed and well-nourished. No distress.  GI: Soft. She exhibits no distension and no mass. There is no tenderness. There is no rebound, no guarding and no CVA tenderness.  Genitourinary:  Speculum exam: Vagina - Small amount of creamy discharge, no odor Cervix - No contact bleeding Bimanual exam: Cervix closed Uterus non tender, normal size Adnexa non tender, no masses bilaterally GC/Chlam, wet prep done Chaperone present for exam.  Musculoskeletal:       Right hip: She exhibits normal range of motion, normal strength and no tenderness.       Left hip: She exhibits normal range of motion, normal strength and no tenderness.  Neurological: She is alert and oriented to person, place, and time.  Skin: She is not diaphoretic.    MAU Course  Procedures  None  MDM  Ibuprofen 600 mg PO   Assessment and Plan   A:  1. Musculoskeletal pain   2. BV (bacterial vaginosis)   3. Missed period   4. Encounter for pregnancy test with result negative    P:  Possibly and likely musculoskeletal in nature; RICE therapy discussed If symptom worsen go to urgent care  Return to MAU for emergency's  RX: Flagyl- no alcohol     Lezlie Lye, NP 05/07/2015 10:22 AM

## 2015-05-08 LAB — GC/CHLAMYDIA PROBE AMP (~~LOC~~) NOT AT ARMC
Chlamydia: NEGATIVE
Neisseria Gonorrhea: NEGATIVE

## 2015-05-09 ENCOUNTER — Encounter (HOSPITAL_COMMUNITY): Payer: Self-pay | Admitting: Emergency Medicine

## 2015-05-09 ENCOUNTER — Emergency Department (HOSPITAL_COMMUNITY)
Admission: EM | Admit: 2015-05-09 | Discharge: 2015-05-09 | Disposition: A | Discharge status: Home / Self Care | Payer: Self-pay | Attending: Emergency Medicine | Admitting: Emergency Medicine

## 2015-05-09 DIAGNOSIS — R1110 Vomiting, unspecified: Secondary | ICD-10-CM | POA: Insufficient documentation

## 2015-05-09 DIAGNOSIS — R509 Fever, unspecified: Secondary | ICD-10-CM | POA: Insufficient documentation

## 2015-05-09 DIAGNOSIS — R52 Pain, unspecified: Secondary | ICD-10-CM | POA: Insufficient documentation

## 2015-05-09 DIAGNOSIS — I10 Essential (primary) hypertension: Secondary | ICD-10-CM | POA: Insufficient documentation

## 2015-05-09 LAB — COMPREHENSIVE METABOLIC PANEL
ALT: 13 U/L — ABNORMAL LOW (ref 14–54)
AST: 19 U/L (ref 15–41)
Albumin: 4.3 g/dL (ref 3.5–5.0)
Alkaline Phosphatase: 75 U/L (ref 38–126)
Anion gap: 14 (ref 5–15)
BUN: 8 mg/dL (ref 6–20)
CO2: 21 mmol/L — ABNORMAL LOW (ref 22–32)
Calcium: 9.6 mg/dL (ref 8.9–10.3)
Chloride: 105 mmol/L (ref 101–111)
Creatinine, Ser: 0.72 mg/dL (ref 0.44–1.00)
GFR calc Af Amer: >60 mL/min (ref >60)
GFR calc non Af Amer: >60 mL/min (ref >60)
Glucose, Bld: 85 mg/dL (ref 65–99)
Potassium: 3.7 mmol/L (ref 3.5–5.1)
Sodium: 140 mmol/L (ref 135–145)
Total Bilirubin: 0.4 mg/dL (ref 0.3–1.2)
Total Protein: 7.7 g/dL (ref 6.5–8.1)

## 2015-05-09 LAB — CBC
HCT: 37.1 % (ref 36.0–46.0)
Hemoglobin: 11.9 g/dL — ABNORMAL LOW (ref 12.0–15.0)
MCH: 26.4 pg (ref 26.0–34.0)
MCHC: 32.1 g/dL (ref 30.0–36.0)
MCV: 82.3 fL (ref 78.0–100.0)
Platelets: 336 10*3/uL (ref 150–400)
RBC: 4.51 MIL/uL (ref 3.87–5.11)
RDW: 15 % (ref 11.5–15.5)
WBC: 7.9 10*3/uL (ref 4.0–10.5)

## 2015-05-09 LAB — LIPASE, BLOOD: Lipase: 23 U/L (ref 11–51)

## 2015-05-09 LAB — I-STAT BETA HCG BLOOD, ED (MC, WL, AP ONLY): I-stat hCG, quantitative: <5 m[IU]/mL (ref <5)

## 2015-05-09 LAB — I-STAT CG4 LACTIC ACID, ED: Lactic Acid, Venous: 1.4 mmol/L (ref 0.5–2.0)

## 2015-05-09 NOTE — ED Notes (Signed)
Pt sts fever, body aches and vomiting x 3 days

## 2015-05-09 NOTE — ED Notes (Signed)
No answer when called for room placement 

## 2015-05-09 NOTE — ED Notes (Signed)
Pt sts took something for fever 30 min ago

## 2015-05-10 ENCOUNTER — Encounter (HOSPITAL_COMMUNITY): Payer: Self-pay

## 2015-05-10 ENCOUNTER — Emergency Department (HOSPITAL_COMMUNITY)
Admission: EM | Admit: 2015-05-10 | Discharge: 2015-05-10 | Disposition: A | Discharge status: Home / Self Care | Payer: Self-pay | Attending: Emergency Medicine | Admitting: Emergency Medicine

## 2015-05-10 ENCOUNTER — Emergency Department (HOSPITAL_COMMUNITY): Payer: Self-pay

## 2015-05-10 DIAGNOSIS — R112 Nausea with vomiting, unspecified: Secondary | ICD-10-CM | POA: Insufficient documentation

## 2015-05-10 DIAGNOSIS — J159 Unspecified bacterial pneumonia: Secondary | ICD-10-CM | POA: Insufficient documentation

## 2015-05-10 DIAGNOSIS — J189 Pneumonia, unspecified organism: Secondary | ICD-10-CM

## 2015-05-10 DIAGNOSIS — Z87440 Personal history of urinary (tract) infections: Secondary | ICD-10-CM | POA: Insufficient documentation

## 2015-05-10 DIAGNOSIS — Z880 Allergy status to penicillin: Secondary | ICD-10-CM | POA: Insufficient documentation

## 2015-05-10 DIAGNOSIS — Z87891 Personal history of nicotine dependence: Secondary | ICD-10-CM | POA: Insufficient documentation

## 2015-05-10 DIAGNOSIS — Z8742 Personal history of other diseases of the female genital tract: Secondary | ICD-10-CM | POA: Insufficient documentation

## 2015-05-10 DIAGNOSIS — Z87442 Personal history of urinary calculi: Secondary | ICD-10-CM | POA: Insufficient documentation

## 2015-05-10 DIAGNOSIS — Z862 Personal history of diseases of the blood and blood-forming organs and certain disorders involving the immune mechanism: Secondary | ICD-10-CM | POA: Insufficient documentation

## 2015-05-10 DIAGNOSIS — R000 Tachycardia, unspecified: Secondary | ICD-10-CM | POA: Insufficient documentation

## 2015-05-10 DIAGNOSIS — I10 Essential (primary) hypertension: Secondary | ICD-10-CM | POA: Insufficient documentation

## 2015-05-10 LAB — RAPID STREP SCREEN (MED CTR MEBANE ONLY): Streptococcus, Group A Screen (Direct): NEGATIVE

## 2015-05-10 MED ORDER — ONDANSETRON 4 MG PO TBDP: 4.0000 mg | ORAL_TABLET | Freq: Three times a day (TID) | ORAL | Status: DC | PRN

## 2015-05-10 MED ORDER — ACETAMINOPHEN 325 MG PO TABS
ORAL_TABLET | ORAL | Status: AC
  Administered 2015-05-10: 11:00:00
  Filled 2015-05-10: qty 2

## 2015-05-10 MED ORDER — GUAIFENESIN-CODEINE 100-10 MG/5ML PO SOLN: 5.0000 mL | Freq: Three times a day (TID) | ORAL | Status: DC | PRN

## 2015-05-10 MED ORDER — ONDANSETRON 4 MG PO TBDP
4.0000 mg | ORAL_TABLET | Freq: Once | ORAL | Status: AC
  Administered 2015-05-10: 4 mg via ORAL

## 2015-05-10 MED ORDER — ALBUTEROL SULFATE HFA 108 (90 BASE) MCG/ACT IN AERS
2.0000 | INHALATION_SPRAY | Freq: Once | RESPIRATORY_TRACT | Status: AC
  Administered 2015-05-10: 2 via RESPIRATORY_TRACT
  Filled 2015-05-10: qty 6.7

## 2015-05-10 MED ORDER — IPRATROPIUM-ALBUTEROL 0.5-2.5 (3) MG/3ML IN SOLN
3.0000 mL | Freq: Once | RESPIRATORY_TRACT | Status: AC
  Administered 2015-05-10: 3 mL via RESPIRATORY_TRACT
  Filled 2015-05-10: qty 3

## 2015-05-10 MED ORDER — SODIUM CHLORIDE 0.9 % IV SOLN
Freq: Once | INTRAVENOUS | Status: AC
  Administered 2015-05-10: 12:00:00 via INTRAVENOUS

## 2015-05-10 MED ORDER — ACETAMINOPHEN 325 MG PO TABS
650.0000 mg | ORAL_TABLET | Freq: Once | ORAL | Status: AC | PRN
  Administered 2015-05-10: 650 mg via ORAL

## 2015-05-10 MED ORDER — AZITHROMYCIN 250 MG PO TABS: 250.0000 mg | ORAL_TABLET | Freq: Every day | ORAL | Status: DC

## 2015-05-10 MED ORDER — ONDANSETRON 4 MG PO TBDP
ORAL_TABLET | ORAL | Status: AC
  Administered 2015-05-10: 11:00:00
  Filled 2015-05-10: qty 1

## 2015-05-10 MED ORDER — AZITHROMYCIN 250 MG PO TABS
500.0000 mg | ORAL_TABLET | Freq: Once | ORAL | Status: AC
  Administered 2015-05-10: 500 mg via ORAL
  Filled 2015-05-10: qty 2

## 2015-05-10 NOTE — ED Notes (Signed)
Pt. Given socks and warm blanket

## 2015-05-10 NOTE — Discharge Instructions (Signed)
Community-Acquired Pneumonia, Adult Pneumonia is an infection of the lungs. One type of pneumonia can happen while a person is in a hospital. A different type can happen when a person is not in a hospital (community-acquired pneumonia). It is easy for this kind to spread from person to person. It can spread to you if you breathe near an infected person who coughs or sneezes. Some symptoms include:  A dry cough.  A wet (productive) cough.  Fever.  Sweating.  Chest pain. HOME CARE  Take over-the-counter and prescription medicines only as told by your doctor.  Only take cough medicine if you are losing sleep.  If you were prescribed an antibiotic medicine, take it as told by your doctor. Do not stop taking the antibiotic even if you start to feel better.  Sleep with your head and neck raised (elevated). You can do this by putting a few pillows under your head, or you can sleep in a recliner.  Do not use tobacco products. These include cigarettes, chewing tobacco, and e-cigarettes. If you need help quitting, ask your doctor.  Drink enough water to keep your pee (urine) clear or pale yellow. A shot (vaccine) can help prevent pneumonia. Shots are often suggested for:  People older than 26 years of age.  People older than 26 years of age:  Who are having cancer treatment.  Who have long-term (chronic) lung disease.  Who have problems with their body's defense system (immune system). You may also prevent pneumonia if you take these actions:  Get the flu (influenza) shot every year.  Go to the dentist as often as told.  Wash your hands often. If soap and water are not available, use hand sanitizer. GET HELP IF:  You have a fever.  You lose sleep because your cough medicine does not help. GET HELP RIGHT AWAY IF:  You are short of breath and it gets worse.  You have more chest pain.  Your sickness gets worse. This is very serious if:  You are an older adult.  Your  body's defense system is weak.  You cough up blood.   This information is not intended to replace advice given to you by your health care provider. Make sure you discuss any questions you have with your health care provider.   Document Released: 08/21/2007 Document Revised: 11/23/2014 Document Reviewed: 06/29/2014 Elsevier Interactive Patient Education 2016 [employer]  Nausea and Vomiting Nausea means you feel sick to your stomach. Throwing up (vomiting) is a reflex where stomach contents come out of your mouth. HOME CARE   Take medicine as told by your doctor.  Do not force yourself to eat. However, you do need to drink fluids.  If you feel like eating, eat a normal diet as told by your doctor.  Eat rice, wheat, potatoes, bread, lean meats, yogurt, fruits, and vegetables.  Avoid high-fat foods.  Drink enough fluids to keep your pee (urine) clear or pale yellow.  Ask your doctor how to replace body fluid losses (rehydrate). Signs of body fluid loss (dehydration) include:  Feeling very thirsty.  Dry lips and mouth.  Feeling dizzy.  Dark pee.  Peeing less than normal.  Feeling confused.  Fast breathing or heart rate. GET HELP RIGHT AWAY IF:   You have blood in your throw up.  You have black or bloody poop (stool).  You have a bad headache or stiff neck.  You feel confused.  You have bad belly (abdominal) pain.  You have chest pain  or trouble breathing.  You do not pee at least once every 8 hours.  You have cold, clammy skin.  You keep throwing up after 24 to 48 hours.  You have a fever. MAKE SURE YOU:   Understand these instructions.  Will watch your condition.  Will get help right away if you are not doing well or get worse.   This information is not intended to replace advice given to you by your health care provider. Make sure you discuss any questions you have with your health care provider.   Document Released: 08/21/2007 Document Revised:  05/27/2011 Document Reviewed: 08/03/2010 Elsevier Interactive Patient Education [employer].

## 2015-05-10 NOTE — ED Provider Notes (Signed)
CSN: [id number]     Arrival date & time 05/10/15  [id number] History  By signing my name below, I, Emma Chambers, attest that this documentation has been prepared under the direction and in the presence of Delsa Grana, PA-C. Electronically Signed: Eustaquio Chambers, ED Scribe. 05/10/2015. 11:06 AM.   Chief Complaint  Patient presents with  . Sore Throat   The history is provided by the patient. No language interpreter was used.     HPI Comments: Emma Chambers is a 25 y.o. female who presents to the Emergency Department complaining of gradual onset, constant, burning, sore throat x 4 days with 10/10 pain, with associated chills, body aches, fever, nasal congestion, headache, and bilateral ear pain. She has productive cough, with yellow to green sputum with rib pain and post-tussive emesis for the past 4 days.  She denies orthopnea, PND, respiratory distress, syncope, CP, LE edema, recent travel.  No hx asthma or bronchitis. Pt is occasional smoker for the past month (once per week).  She has been around second hand smoke since childhood. No recent foreign travel.  She states she has roughly 4-5 episodes of vomiting per day, and once today in the ER. With vomiting she has brief abdominal pain that last 5-10 minutes.  She denies diarrhea, hematemesis, hemoptysis, melena.  She states she had a normal bowel movement this morning.  She denies any dysuria, hematuria, flank pain, no vaginal sx.   Recent sick contact with daughter.     Per chart review: Pt was in the ED yesterday for same symptoms but left before being seen due to long wait times. Pt did have lab work done yesterday including: CMP, CBC, Lactic acid, and Lipase.    Past Medical History  Diagnosis Date  . UTI (lower urinary tract infection)   . Headache(784.0)     chronic  . Gallstones   . Hypertension     GHTN with last pg  . Pregnancy induced hypertension   . Anemia   . Kidney stones   . Ovarian cyst    Past Surgical History   Procedure Laterality Date  . Cholecystectomy N/A 07/27/2012    Procedure: LAPAROSCOPIC CHOLECYSTECTOMY WITH INTRAOPERATIVE CHOLANGIOGRAM;  Surgeon: Madilyn Hook, DO;  Location: [facility];  Service: General;  Laterality: N/A;  . Appendectomy    . Laparoscopic appendectomy N/A 12/09/2012    Procedure: APPENDECTOMY LAPAROSCOPIC;  Surgeon: Gwenyth Ober, MD;  Location: [facility];  Service: General;  Laterality: N/A;  . Diagnostic laparoscopy with removal of ectopic pregnancy N/A 12/27/2014    Procedure: DIAGNOSTIC LAPAROSCOPY, Right Salpingectomy with removal ECTOPIC PREGNANCY;  Surgeon: [name], MD;  Location: [facility];  Service: Gynecology;  Laterality: N/A;   Family History  Problem Relation Age of Onset  . Asthma Mother   . Hypertension Mother   . Parkinsonism Mother   . Diabetes Maternal Grandmother   . Hypertension Maternal Grandmother   . Stroke Maternal Grandmother   . Heart disease Maternal Grandmother   . Anesthesia problems Neg Hx   . Malignant hyperthermia Neg Hx   . Pseudochol deficiency Neg Hx   . Hypotension Neg Hx   . Asthma Brother   . Lupus Maternal Aunt   . Heart disease Maternal Grandfather    Social History  Substance Use Topics  . Smoking status: Former Smoker    Quit date: 12/18/2011  . Smokeless tobacco: Never Used     Comment: 2014  . Alcohol Use: 0.0 oz/week  Comment: social   OB History    Gravida Para Term Preterm AB TAB SAB Ectopic Multiple Living   3 2 1 1  0 0 0 0 0 2     Review of Systems  Constitutional: Positive for fever and chills.  HENT: Positive for ear pain and sore throat.   Respiratory: Positive for cough, shortness of breath and wheezing.   Gastrointestinal: Positive for vomiting (post-tussive) and abdominal pain. Negative for diarrhea.  Musculoskeletal: Positive for myalgias and neck pain.  Neurological: Positive for headaches.  All other systems reviewed and are negative.   Allergies  Amoxicillin; Contrast media; and  Dilaudid  Home Medications   Prior to Admission medications   Medication Sig Start Date End Date Taking? Authorizing Provider  acetaminophen (TYLENOL) 500 MG tablet Take 1,000 mg by mouth every 6 (six) hours as needed for mild pain.    Historical Provider, MD  azithromycin (ZITHROMAX) 250 MG tablet Take 1 tablet (250 mg total) by mouth daily. Take first 2 tablets together, then 1 every day until finished. 05/10/15   Delsa Grana, PA-C  guaiFENesin-codeine 100-10 MG/5ML syrup Take 5 mLs by mouth 3 (three) times daily as needed for cough. 05/10/15   Delsa Grana, PA-C  metroNIDAZOLE (FLAGYL) 500 MG tablet Take 1 tablet (500 mg total) by mouth 2 (two) times daily. 05/07/15   Lezlie Lye, NP  ondansetron (ZOFRAN ODT) 4 MG disintegrating tablet Take 1 tablet (4 mg total) by mouth every 8 (eight) hours as needed for nausea or vomiting. 05/10/15   Delsa Grana, PA-C   BP 103/64 mmHg  Pulse 101  Temp(Src) 100 F (37.8 C) (Oral)  Resp 20  Ht 5\' 1"  (1.549 m)  Wt 99.536 kg  BMI 41.48 kg/m2  SpO2 100%  LMP 03/17/2015   Physical Exam  Constitutional: She is oriented to person, place, and time. She appears well-developed and well-nourished. No distress.  HENT:  Head: Normocephalic and atraumatic.  Right Ear: Tympanic membrane normal.  Left Ear: Tympanic membrane normal.  Mouth/Throat: Mucous membranes are normal. No oropharyngeal exudate or posterior oropharyngeal erythema.  Nasal congestion. Clear discharge.   Eyes: Conjunctivae and EOM are normal.  Neck: Neck supple. No tracheal deviation present.  Cardiovascular: Regular rhythm.  Tachycardia present.   Normal pulses  Pulmonary/Chest: Effort normal. No respiratory distress. She has no wheezes. She has no rales.  Diminished breath sounds at the bases.   Abdominal: Soft. There is no tenderness.  Musculoskeletal: Normal range of motion.  Lymphadenopathy:    She has no cervical adenopathy.  Neurological: She is alert and oriented to person,  place, and time.  Skin: Skin is warm and dry. She is not diaphoretic.  Cap refill < 2 seconds  Psychiatric: She has a normal mood and affect. Her behavior is normal.  Nursing note and vitals reviewed.   ED Course  Procedures (including critical care time)  DIAGNOSTIC STUDIES: Oxygen Saturation is 100% on RA, normal by my interpretation.    COORDINATION OF CARE: 11:04 AM-Discussed treatment plan which includes CXR and rapid strep test with pt at bedside and pt agreed to plan.   Labs Review Labs Reviewed  RAPID STREP SCREEN (NOT AT [facility])  CULTURE, GROUP A STREP [facility])   Imaging Review Dg Chest 2 View  05/10/2015  CLINICAL DATA:  Fever.  Sore throat. EXAM: CHEST  2 VIEW COMPARISON:  10/18/2014. FINDINGS: Mediastinum and hilar structures normal. Low lung volumes with mild bibasilar atelectasis. Mild infiltrate right lung base cannot be  excluded . No pleural effusion pneumothorax. Mild cardiomegaly. No acute bony abnormality. IMPRESSION: Low lung volumes with mild bibasilar atelectasis. Mild infiltrate right lung base cannot be excluded. Electronically Signed   By: Marcello Moores  Register   On: 05/10/2015 11:41   I have personally reviewed and evaluated these images and lab results as part of my medical decision-making.   EKG Interpretation None      MDM   Pt with multiple complaints including ST, cough, fever, N, V.  She presented to the ER yesterday, had labs obtained, but left before being seen.  Labs yesterday reviewed, unremarkable, including no white count, normal chemistry, normal lactic acid.  She has mild and stable anemia. She returned today with persistent sx.  Chest x-ray and rapid strep obtained.  She was given multiple breathing treatments and IVF.  CXR pertinent for possible RLL infiltrate.  BS improved with nebs.   Pt has been sleeping comfortably in the ER chair.  She has no respiratory distress, no tachypnea, no accessory muscle use.  She has been able to tolerate PO  medicine without any emesis, however when specifically asked to trial PO's, she spit water back out, witnessed by the nurse.  She continues to complain about pain, and she has piled on multiple layers of clothes and blankets, despite multiple requests.  She was given antipyretics, not much improvement of temp, likely secondary to her noncompliance.  Her HR has remained 101-103, however after multiple nebs and fluids, I suspect her HR is elevated from albuterol, and overall, HR will improve once meds wear off.  She has maintained SpO2.  Feel she is safe to discharge home.    Will D/C home with abx, inhaler, cough medicine, zofran.  Encouraged rest, fluids, tylenol and ibuprofen for pain and fever.  Return precautions reviewed.  Final diagnoses:  Non-intractable vomiting with nausea, vomiting of unspecified type  CAP (community acquired pneumonia)   I personally performed the services described in this documentation, which was scribed in my presence. The recorded information has been reviewed and is accurate.        Delsa Grana, PA-C 05/10/15 2152  Noemi Chapel, MD 05/11/15 [phone]

## 2015-05-10 NOTE — ED Notes (Addendum)
Pt. Having sore throat, cough, chills, body aches, fevers. N/v.  Pt. Had an episode of vomiting in the waiting room.  She was here yesterday and left due to long wait times.  Labs were done yesterday.  Pt. Has a very congested cough with green sputum.  Pt. Also reports having n/v all night and a severe headache.

## 2015-05-12 LAB — CULTURE, GROUP A STREP (THRC)

## 2015-05-30 ENCOUNTER — Emergency Department (HOSPITAL_COMMUNITY)
Admission: EM | Admit: 2015-05-30 | Discharge: 2015-05-30 | Disposition: A | Discharge status: Home / Self Care | Payer: Self-pay | Attending: Emergency Medicine | Admitting: Emergency Medicine

## 2015-05-30 DIAGNOSIS — Z87891 Personal history of nicotine dependence: Secondary | ICD-10-CM | POA: Insufficient documentation

## 2015-05-30 DIAGNOSIS — Z862 Personal history of diseases of the blood and blood-forming organs and certain disorders involving the immune mechanism: Secondary | ICD-10-CM | POA: Insufficient documentation

## 2015-05-30 DIAGNOSIS — Z8742 Personal history of other diseases of the female genital tract: Secondary | ICD-10-CM | POA: Insufficient documentation

## 2015-05-30 DIAGNOSIS — Z87440 Personal history of urinary (tract) infections: Secondary | ICD-10-CM | POA: Insufficient documentation

## 2015-05-30 DIAGNOSIS — Z8719 Personal history of other diseases of the digestive system: Secondary | ICD-10-CM | POA: Insufficient documentation

## 2015-05-30 DIAGNOSIS — Z87442 Personal history of urinary calculi: Secondary | ICD-10-CM | POA: Insufficient documentation

## 2015-05-30 DIAGNOSIS — I10 Essential (primary) hypertension: Secondary | ICD-10-CM | POA: Insufficient documentation

## 2015-05-30 DIAGNOSIS — R05 Cough: Secondary | ICD-10-CM | POA: Insufficient documentation

## 2015-05-30 DIAGNOSIS — Z880 Allergy status to penicillin: Secondary | ICD-10-CM | POA: Insufficient documentation

## 2015-05-30 DIAGNOSIS — R1013 Epigastric pain: Secondary | ICD-10-CM | POA: Insufficient documentation

## 2015-05-30 DIAGNOSIS — Z3202 Encounter for pregnancy test, result negative: Secondary | ICD-10-CM | POA: Insufficient documentation

## 2015-05-30 LAB — URINE MICROSCOPIC-ADD ON

## 2015-05-30 LAB — PREGNANCY, URINE: Preg Test, Ur: NEGATIVE

## 2015-05-30 LAB — LIPASE, BLOOD: Lipase: 30 U/L (ref 11–51)

## 2015-05-30 LAB — CBC
HCT: 38.7 % (ref 36.0–46.0)
Hemoglobin: 12.5 g/dL (ref 12.0–15.0)
MCH: 26.4 pg (ref 26.0–34.0)
MCHC: 32.3 g/dL (ref 30.0–36.0)
MCV: 81.8 fL (ref 78.0–100.0)
Platelets: 353 10*3/uL (ref 150–400)
RBC: 4.73 MIL/uL (ref 3.87–5.11)
RDW: 15.3 % (ref 11.5–15.5)
WBC: 8.6 10*3/uL (ref 4.0–10.5)

## 2015-05-30 LAB — COMPREHENSIVE METABOLIC PANEL
ALT: 13 U/L — ABNORMAL LOW (ref 14–54)
AST: 20 U/L (ref 15–41)
Albumin: 4.1 g/dL (ref 3.5–5.0)
Alkaline Phosphatase: 72 U/L (ref 38–126)
Anion gap: 9 (ref 5–15)
BUN: 8 mg/dL (ref 6–20)
CO2: 23 mmol/L (ref 22–32)
Calcium: 9.5 mg/dL (ref 8.9–10.3)
Chloride: 107 mmol/L (ref 101–111)
Creatinine, Ser: 0.63 mg/dL (ref 0.44–1.00)
GFR calc Af Amer: >60 mL/min (ref >60)
GFR calc non Af Amer: >60 mL/min (ref >60)
Glucose, Bld: 79 mg/dL (ref 65–99)
Potassium: 3.8 mmol/L (ref 3.5–5.1)
Sodium: 139 mmol/L (ref 135–145)
Total Bilirubin: 0.5 mg/dL (ref 0.3–1.2)
Total Protein: 7.6 g/dL (ref 6.5–8.1)

## 2015-05-30 LAB — URINALYSIS, ROUTINE W REFLEX MICROSCOPIC
Bilirubin Urine: NEGATIVE
Glucose, UA: NEGATIVE mg/dL
Ketones, ur: NEGATIVE mg/dL
Leukocytes, UA: NEGATIVE
Nitrite: NEGATIVE
Protein, ur: NEGATIVE mg/dL
Specific Gravity, Urine: 1.03 (ref 1.005–1.030)
pH: 5.5 (ref 5.0–8.0)

## 2015-05-30 MED ORDER — FAMOTIDINE 20 MG PO TABS
20.0000 mg | ORAL_TABLET | Freq: Once | ORAL | Status: AC
  Administered 2015-05-30: 20 mg via ORAL
  Filled 2015-05-30: qty 1

## 2015-05-30 MED ORDER — FAMOTIDINE 20 MG PO TABS: 20.0000 mg | ORAL_TABLET | Freq: Two times a day (BID) | ORAL | Status: DC

## 2015-05-30 NOTE — ED Provider Notes (Signed)
CSN: [id number]     Arrival date & time 05/30/15  1052 History  By signing my name below, I, Evelene Croon, attest that this documentation has been prepared under the direction and in the presence of non-physician practitioner, Harlene Ramus, PA-C. Electronically Signed: Evelene Croon, Scribe. 05/30/2015. 4:41 PM.      Chief Complaint  Patient presents with  . Abdominal Pain   The history is provided by the patient and medical records. No language interpreter was used.    HPI Comments:  Emma Chambers is a 25 y.o. female who presents to the Emergency Department complaining of non-radiating, constant, upper  abdominal pain x 2 days. She describes her pain as a  burning sensation. Denies any aggravating or alleviating factors. Pt reports a h/o similar pain when diagnosed with cholecystitis  but notes she had a cholecystectomy.  She denies fever, chills, nausea, vomiting, SOB, diarrhea, dysuria, hematuria, constipation, and vaginal bleeding/discharge. She has taken ibuprofen without relief. Pt has a PMHx of laparoscopy with removal of ectopic pregnancy and appendectomy.   Pt notes she recently finished course of abx secondary to CAP; she notes mild continued dry cough but states symptoms overall have improved.   LNMP 05/11/15  Past Medical History  Diagnosis Date  . UTI (lower urinary tract infection)   . Headache(784.0)     chronic  . Gallstones   . Hypertension     GHTN with last pg  . Pregnancy induced hypertension   . Anemia   . Kidney stones   . Ovarian cyst    Past Surgical History  Procedure Laterality Date  . Cholecystectomy N/A 07/27/2012    Procedure: LAPAROSCOPIC CHOLECYSTECTOMY WITH INTRAOPERATIVE CHOLANGIOGRAM;  Surgeon: Madilyn Hook, DO;  Location: [facility];  Service: General;  Laterality: N/A;  . Appendectomy    . Laparoscopic appendectomy N/A 12/09/2012    Procedure: APPENDECTOMY LAPAROSCOPIC;  Surgeon: Gwenyth Ober, MD;  Location: [facility];  Service: General;   Laterality: N/A;  . Diagnostic laparoscopy with removal of ectopic pregnancy N/A 12/27/2014    Procedure: DIAGNOSTIC LAPAROSCOPY, Right Salpingectomy with removal ECTOPIC PREGNANCY;  Surgeon: [name], MD;  Location: [facility];  Service: Gynecology;  Laterality: N/A;   Family History  Problem Relation Age of Onset  . Asthma Mother   . Hypertension Mother   . Parkinsonism Mother   . Diabetes Maternal Grandmother   . Hypertension Maternal Grandmother   . Stroke Maternal Grandmother   . Heart disease Maternal Grandmother   . Anesthesia problems Neg Hx   . Malignant hyperthermia Neg Hx   . Pseudochol deficiency Neg Hx   . Hypotension Neg Hx   . Asthma Brother   . Lupus Maternal Aunt   . Heart disease Maternal Grandfather    Social History  Substance Use Topics  . Smoking status: Former Smoker    Quit date: 12/18/2011  . Smokeless tobacco: Never Used     Comment: 2014  . Alcohol Use: 0.0 oz/week     Comment: social   OB History    Gravida Para Term Preterm AB TAB SAB Ectopic Multiple Living   3 2 1 1  0 0 0 0 0 2     Review of Systems  Constitutional: Negative for fever and chills.  Respiratory: Positive for cough (residual). Negative for shortness of breath.   Gastrointestinal: Positive for abdominal pain. Negative for nausea, vomiting, diarrhea and constipation.  Genitourinary: Negative for dysuria, hematuria, vaginal bleeding and vaginal discharge.  All other systems  reviewed and are negative.   Allergies  Amoxicillin; Contrast media; and Dilaudid  Home Medications   Prior to Admission medications   Medication Sig Start Date End Date Taking? Authorizing Provider  acetaminophen (TYLENOL) 500 MG tablet Take 1,000 mg by mouth every 6 (six) hours as needed for mild pain.   Yes Historical Provider, MD  famotidine (PEPCID) 20 MG tablet Take 1 tablet (20 mg total) by mouth 2 (two) times daily. 05/30/15   Chesley Noon Nadeau, PA-C   BP 122/72 mmHg  Pulse 78  Temp(Src)  98.5 F (36.9 C) (Oral)  Resp 18  Ht 5\' 2"  (1.575 m)  Wt 96.163 kg  BMI 38.77 kg/m2  SpO2 100%  LMP 05/10/2015 Physical Exam  Constitutional: She is oriented to person, place, and time. She appears well-developed and well-nourished. No distress.  HENT:  Head: Normocephalic and atraumatic.  Mouth/Throat: Oropharynx is clear and moist. No oropharyngeal exudate.  Eyes: Conjunctivae and EOM are normal. Right eye exhibits no discharge. Left eye exhibits no discharge. No scleral icterus.  Neck: Normal range of motion. Neck supple.  Cardiovascular: Normal rate, regular rhythm, normal heart sounds and intact distal pulses.   Pulmonary/Chest: Effort normal and breath sounds normal. No respiratory distress. She has no wheezes. She has no rales. She exhibits no tenderness.  Abdominal: Soft. Bowel sounds are normal. She exhibits no distension and no mass. There is tenderness (mild tenderness over epigastric region). There is no rebound and no guarding.  Musculoskeletal: Normal range of motion. She exhibits no edema.  Lymphadenopathy:    She has no cervical adenopathy.  Neurological: She is alert and oriented to person, place, and time.  Skin: Skin is warm and dry.  Psychiatric: She has a normal mood and affect.  Nursing note and vitals reviewed.   ED Course  Procedures    DIAGNOSTIC STUDIES:  Oxygen Saturation is 100% on RA, normal by my interpretation.    COORDINATION OF CARE:  4:37 PM Will order pepcid. Discussed treatment plan with pt at bedside and pt agreed to plan.  Labs Review Labs Reviewed  COMPREHENSIVE METABOLIC PANEL - Abnormal; Notable for the following:    ALT 13 (*)    All other components within normal limits  URINALYSIS, ROUTINE W REFLEX MICROSCOPIC (NOT AT [facility]) - Abnormal; Notable for the following:    APPearance HAZY (*)    Hgb urine dipstick TRACE (*)    All other components within normal limits  URINE MICROSCOPIC-ADD ON - Abnormal; Notable for the following:     Squamous Epithelial / LPF 6-30 (*)    Bacteria, UA MANY (*)    All other components within normal limits  LIPASE, BLOOD  CBC  PREGNANCY, URINE    I have personally reviewed and evaluated these lab results as part of my medical decision-making.    MDM   Final diagnoses:  Epigastric pain    Patient presents with burning epigastric pain. History of cholecystectomy. VSS. Exam revealed mild tenderness in epigastric region, no peritoneal signs, remaining exam unremarkable. Patient given Pepcid in the ED. On reevaluation she reports her pain has improved. Labs and urine unremarkable. Pregnancy negative.  Patient does not meet the SIRS or Sepsis criteria.  No indication of  bowel obstruction, bowel perforation,  diverticulitis, PID or ectopic pregnancy. I suspect patient's symptoms are likely due to reflux and don't feel that any further workup or imaging is warranted at this time.  Patient discharged home with Pepcid and symptomatic treatment and given strict  instructions for follow-up with their primary care physician.  I have also discussed reasons to return immediately to the ER.  Patient expresses understanding and agrees with plan.   I personally performed the services described in this documentation, which was scribed in my presence. The recorded information has been reviewed and is accurate.    Chesley Noon [place], [place] 05/31/15 [id number]  Pattricia Boss, MD 06/01/15 1225

## 2015-05-30 NOTE — ED Notes (Signed)
Pt sts that she started to have abd pain since Saturday. Pain is a burning feeling that goes up to chest area. Pt sts nothing makes it better or worse. Denies any n/v/d. Pt denies any chance of being pregnant due to abstinence.

## 2015-05-30 NOTE — ED Notes (Signed)
Call lab to add on urine pregnant.

## 2015-05-30 NOTE — Discharge Instructions (Signed)
Take your medications as prescribed. I recommend following the diet listed in the papers provided below to try to prevent your symptoms that appear to be from acid reflux. Please follow up with a primary care provider from the Resource Guide provided below in 4-5 days. Please return to the Emergency Department if symptoms worsen or new onset of fever, vomiting, worsening abdominal pain, diarrhea, blood in urine or stool, unable to tolerate fluids.

## 2015-06-09 ENCOUNTER — Encounter (HOSPITAL_COMMUNITY): Payer: Self-pay | Admitting: *Deleted

## 2015-06-09 ENCOUNTER — Emergency Department (HOSPITAL_COMMUNITY)
Admission: EM | Admit: 2015-06-09 | Discharge: 2015-06-09 | Disposition: A | Discharge status: Home / Self Care | Payer: Self-pay | Attending: Emergency Medicine | Admitting: Emergency Medicine

## 2015-06-09 DIAGNOSIS — Z880 Allergy status to penicillin: Secondary | ICD-10-CM | POA: Insufficient documentation

## 2015-06-09 DIAGNOSIS — F410 Panic disorder [episodic paroxysmal anxiety] without agoraphobia: Secondary | ICD-10-CM

## 2015-06-09 DIAGNOSIS — Z8719 Personal history of other diseases of the digestive system: Secondary | ICD-10-CM | POA: Insufficient documentation

## 2015-06-09 DIAGNOSIS — Z79899 Other long term (current) drug therapy: Secondary | ICD-10-CM | POA: Insufficient documentation

## 2015-06-09 DIAGNOSIS — Z87440 Personal history of urinary (tract) infections: Secondary | ICD-10-CM | POA: Insufficient documentation

## 2015-06-09 DIAGNOSIS — F419 Anxiety disorder, unspecified: Secondary | ICD-10-CM | POA: Insufficient documentation

## 2015-06-09 DIAGNOSIS — Z8742 Personal history of other diseases of the female genital tract: Secondary | ICD-10-CM | POA: Insufficient documentation

## 2015-06-09 DIAGNOSIS — Z862 Personal history of diseases of the blood and blood-forming organs and certain disorders involving the immune mechanism: Secondary | ICD-10-CM | POA: Insufficient documentation

## 2015-06-09 DIAGNOSIS — Z87891 Personal history of nicotine dependence: Secondary | ICD-10-CM | POA: Insufficient documentation

## 2015-06-09 DIAGNOSIS — G8929 Other chronic pain: Secondary | ICD-10-CM | POA: Insufficient documentation

## 2015-06-09 DIAGNOSIS — Z87442 Personal history of urinary calculi: Secondary | ICD-10-CM | POA: Insufficient documentation

## 2015-06-09 NOTE — ED Notes (Signed)
Pt is tearful upon exam.

## 2015-06-09 NOTE — ED Provider Notes (Signed)
CSN: [id number]     Arrival date & time 06/09/15  [id number] History   First MD Initiated Contact with Patient 06/09/15 [phone]     Chief Complaint  Patient presents with  . Panic Attack     (Consider location/radiation/quality/duration/timing/severity/associated sxs/prior Treatment) HPI Comments: The patient is a 25 year old female, she has a history of what she describes as intermittent depression, she has been feeling more anxious recently stating that her son who was diagnosed with skin cancer has been in and out of the hospital, her grandmother died unexpectedly of a brain tumor complication 4 days ago. She was not diagnosed with brain cancer prior to her demise. The patient states that while she was at work this morning she felt like someone was grabbing her chest, giving her a bear hug and she felt short of breath and unable to breathe. This was short-lived, lasting approximately 30-40 minutes and has eased off though still present here. She denies any headache, nausea, vomiting, coughing, fever, swelling of the legs or any other complaints. She has no history of heart disease, there is no family history of heart disease, the patient has no complaints at this time other than the mild squeezing sensation.  The history is provided by the patient.    Past Medical History  Diagnosis Date  . UTI (lower urinary tract infection)   . Headache(784.0)     chronic  . Gallstones   . Hypertension     GHTN with last pg  . Pregnancy induced hypertension   . Anemia   . Kidney stones   . Ovarian cyst    Past Surgical History  Procedure Laterality Date  . Cholecystectomy N/A 07/27/2012    Procedure: LAPAROSCOPIC CHOLECYSTECTOMY WITH INTRAOPERATIVE CHOLANGIOGRAM;  Surgeon: Madilyn Hook, DO;  Location: [facility];  Service: General;  Laterality: N/A;  . Appendectomy    . Laparoscopic appendectomy N/A 12/09/2012    Procedure: APPENDECTOMY LAPAROSCOPIC;  Surgeon: Gwenyth Ober, MD;  Location: [place];  Service:  General;  Laterality: N/A;  . Diagnostic laparoscopy with removal of ectopic pregnancy N/A 12/27/2014    Procedure: DIAGNOSTIC LAPAROSCOPY, Right Salpingectomy with removal ECTOPIC PREGNANCY;  Surgeon: [name], MD;  Location: [facility];  Service: Gynecology;  Laterality: N/A;   Family History  Problem Relation Age of Onset  . Asthma Mother   . Hypertension Mother   . Parkinsonism Mother   . Diabetes Maternal Grandmother   . Hypertension Maternal Grandmother   . Stroke Maternal Grandmother   . Heart disease Maternal Grandmother   . Anesthesia problems Neg Hx   . Malignant hyperthermia Neg Hx   . Pseudochol deficiency Neg Hx   . Hypotension Neg Hx   . Asthma Brother   . Lupus Maternal Aunt   . Heart disease Maternal Grandfather    Social History  Substance Use Topics  . Smoking status: Former Smoker    Quit date: 12/18/2011  . Smokeless tobacco: Never Used     Comment: 2014  . Alcohol Use: 0.0 oz/week     Comment: social   OB History    Gravida Para Term Preterm AB TAB SAB Ectopic Multiple Living   3 2 1 1  0 0 0 0 0 2     Review of Systems  All other systems reviewed and are negative.     Allergies  Amoxicillin; Contrast media; and Dilaudid  Home Medications   Prior to Admission medications   Medication Sig Start Date End Date Taking? Authorizing Provider  acetaminophen (TYLENOL) 500 MG tablet Take 1,000 mg by mouth every 6 (six) hours as needed for mild pain.    Historical Provider, MD  famotidine (PEPCID) 20 MG tablet Take 1 tablet (20 mg total) by mouth 2 (two) times daily. 05/30/15   Chesley Noon Nadeau, PA-C   BP 116/74 mmHg  Pulse 79  Resp 12  SpO2 100%  LMP 05/10/2015 Physical Exam  Constitutional: She appears well-developed and well-nourished. No distress.  HENT:  Head: Normocephalic and atraumatic.  Mouth/Throat: Oropharynx is clear and moist. No oropharyngeal exudate.  Eyes: Conjunctivae and EOM are normal. Pupils are equal, round, and  reactive to light. Right eye exhibits no discharge. Left eye exhibits no discharge. No scleral icterus.  Neck: Normal range of motion. Neck supple. No JVD present. No thyromegaly present.  Cardiovascular: Normal rate, regular rhythm, normal heart sounds and intact distal pulses.  Exam reveals no gallop and no friction rub.   No murmur heard. Pulmonary/Chest: Effort normal and breath sounds normal. No respiratory distress. She has no wheezes. She has no rales.  Abdominal: Soft. Bowel sounds are normal. She exhibits no distension and no mass. There is no tenderness.  Musculoskeletal: Normal range of motion. She exhibits no edema or tenderness.  Lymphadenopathy:    She has no cervical adenopathy.  Neurological: She is alert. Coordination normal.  Skin: Skin is warm and dry. No rash noted. No erythema.  Psychiatric:  Tearful, anxious  Nursing note and vitals reviewed.   ED Course  Procedures (including critical care time) Labs Review Labs Reviewed - No data to display  Imaging Review No results found. I have personally reviewed and evaluated these images and lab results as part of my medical decision-making.   EKG Interpretation   Date/Time:  Friday June 09 2015 09:21:48 EDT Ventricular Rate:  97 PR Interval:  150 QRS Duration: 70 QT Interval:  324 QTC Calculation: 411 R Axis:   58 Text Interpretation:  Normal sinus rhythm Nonspecific T wave abnormality  Abnormal ECG since last tracing no significant change Confirmed by Sabra Heck   MD, Kalliope Riesen ([id number]) on 06/09/2015 9:24:59 AM      MDM   Final diagnoses:  Anxiety attack    The patient states that she feels worn out and tired, she does not want to talk to anybody, she does not want to see a therapist, she does not have anybody to talk to but states that she is very resistant to talking about her problems or concerns or anxiety with anybody else. Her EKG is unremarkable, her vital signs are unremarkable, I suspect that her symptoms  are related to an anxiety attack or panic attack. She does report that she had these when she was younger as well. There is no indication for further testing knowing that this is not cardiac source, she has asked that I speak with her employer who she states is common with her father.  Stable for d/c. VS normal  Noemi Chapel, MD 06/09/15 1133

## 2015-06-09 NOTE — ED Notes (Signed)
Pt arrives via POV and states she had an episode of cp and hyperventilation today.

## 2015-06-09 NOTE — Discharge Instructions (Signed)
Community Resource Guide Outpatient Counseling/Substance Abuse Adult °The United Way’s “211” is a great source of information about community services available.  Access by dialing 2-1-1 from anywhere in [place], or by website -  www.nc211.org.  ° °Other Local Resources (Updated 03/2015) ° °Crisis Hotlines °  °Services  ° °  °Area Served  °[facility] • Crisis Hotline, available 24 hours a day, 7 days a week: 800-939-5911 [place], [place]  ° [facility] • Crisis Hotline, available 24 hours a day, 7 days a week: 866-275-9552 [place], [place]  °[facility] • Suicide Prevention Hotline, available 24 hours a day, 7 days a week: 800-273-8255 [place], [place]  °[facility] ° • Crisis Hotline, available 24 hours a day, 7 days a week: 336-676-6840 [place], [place] °  °[facility] • Crisis Hotline, available 24 hours a day, 7 days a week: 800-256-2452 All °  °[facility] • Crisis Hotline, available 24 hours a day, 7 days a week: 877-626-1772 All  ° °Other Local Resources (Updated 03/2015) ° °Outpatient Counseling/ Substance Abuse Programs  °Services  ° °  °Address and Phone Number  °[facility] ° • Options include Individual counseling, group counseling, intensive outpatient program (several hours a day, several days a week) °• Offers depression assessments °• Provides methadone maintenance program 336-333-6860 °[address], [address] °[place], [place] [address] °  °[facility] ° • Offers partial hospitalization/day treatment and DUI/DWI programs °• Accepts Medicare, private insurance 336-299-4655 °[address], [address] °[place], [place] [address]  °[facility] ° ° • Services include intensive outpatient program (several hours a day, several days a week), outpatient treatment, DUI/DWI services, family education °• Also has some services specifically for Veterans °• Offers transitional housing   336-886-5594 °[address] °[place], [place] [address] °  °  °[facility] • Accepts Medicare, private pay, and private insurance 336-272-0855 °[address] °[place], [place] [address]  °[facility] • Services include individual counseling, substance abuse intensive outpatient program (several hours a day, several days a week), day treatment °• Accepts Medicare, Medicaid, private insurance 336-271-5888 °[address] °[place], [place] [address]  ° [facility] ° • Offers substance abuse intensive outpatient program (several hours a day, several days a week), partial hospitalization program 336-832-9800 °[address] °[place], [place] [address] ° °336-349-4454 °[address] °[place], [place] [address] ° °336-386-3795 °[address] °[place], [place] [address] ° °[phone] °[address] °[place], [place] [address]  °[facility] • Individual counseling only °• Accepts private insurance only 336-292-1510 °[address] °[place], [place] [address]  °[facility] • Methadone maintenance program 800-805-6989 °[address] °[place], [place] [address]  °[facility] • Walk-In Clinic providing substance abuse and mental health counseling °• Accepts Medicaid, Medicare, private insurance °• Offers sliding scale for uninsured 336-342-8316 °[address] °[place], [place]   °[facility] • Offers individual counseling, and intensive in-home services 336-347-7415 °[address] °[place], [place] [address]  °[name] • Offers individual counseling, family counseling, group therapy, domestic violence counseling, consumer credit counseling °• Accepts Medicare, Medicaid, private insurance °• Offers sliding scale for uninsured 336-387-6161 °[address] °, [place] [address] ° °336-889-6161 °[place], [address] °[place],  [address]  °[facility] • Offers individual, family   and group counseling °• 3 locations - [place], [place], and [place] ° 336-899-8800 ° °[address] °[place], [place] [address] ° °[address] °[place], [place] [address] ° °[address] °[place], [place] [address]  °[facility]  ° • Offers psychiatric assessment, 8-week Intensive Outpatient Program (several hours a day, several times a week, daytime or evenings), early recovery group, family Program, medication management °• Private pay or private insurance only 336 -621-3381, or  °800-659-3381 °[address] °[place], [place] [address]  °[facility] • Offers individual, couples and family counseling °• Accepts Medicaid, private insurance, and sliding scale for uninsured 336-542-2076 °[address] °[place], [place] [address]  °David Fuller, MD • Individual counseling °• Private insurance 336-852-4051 °[address] °[place], [place] [address]  °[facility] ° • Offers assessment, substance abuse treatment, and behavioral health treatment 336-878-6098 °[address] °[place], [place] [address]  °[facility] • Individual counseling °• Accepts private insurance 336-272-1972 °[address] °[place], [place] [address]  °[facility] • Individual counseling °• Accepts Medicare, private insurance 336-547-1574 °[address] °[place], [place] [address]  °[facility]  ° • Offers intensive outpatient program (several hours a day, several times a week) °• Private pay, private insurance [phone] °[address] °[place], [place]  °[facility] • Individual counseling °• Medicare, private insurance 336-505-9494 °[address] °[place], [place] [address]  °[facility]  ° • Offers intensive outpatient program (several hours a day, several times a week) and partial hospitalization  program 336-794-3550 °[address] °[place], [place] [address]  °Parrish McKinney, MD • Individual counseling 336-282-1251 °[address] °[place], [place] [address]  °[facility] • Offers Christian counseling to individuals, couples, and families °• Accepts Medicare and private insurance; offers sliding scale for uninsured 336-288-1484 °[address] °[place], [place] [address]  °[facility] • Christian counseling 336-542-2060 °[address] °[place], [place] [address]  °[facility] ° • Offers crisis counseling, individual counseling, group therapy, in-home therapy, domestic violence services, day treatment, DWI services, Community Support Team (CST), Assertive Community Treatment Team (ACTT), substance abuse Intensive Outpatient Program (several hours a day, several times a week) °• 2 locations - [place] and [place] 336-229-5905 °[address] °[place], [place] [address] ° °336-694-1777 °[address] °[place], [place] [address]  °[facility]  ° ° • Individual counseling and group therapy °• Accepts private insurance, Medicare, Medicaid 336-379-7146 °[address] °[place], [place]  °[facility] • Offers individual and family counseling °• Offers LGBTQ services °• Accepts private insurance and private pay 336-288-9190 °[address] °[place], [place] [address]  °[facility]  ° • Offers individual counseling, group therapy, and outpatient detox °• Accepts private insurance 336-389-1413 °[address] °[place], [place]  °[facility] • Individual counseling °• Accepts Medicare, private insurance 336-632-3505 °[address] °[place], [place] [address]  °[facility] • Individual counseling °• Accepts Medicare, private insurance 336-570-0104 °[address] °, [place] [address]  °[facility] ° • Offers substance abuse  Intensive Outpatient Program (several hours a day, several times a week) [phone], or °[phone] °[place],   ° °

## 2015-06-09 NOTE — ED Notes (Signed)
Pt is in stable condition upon d/c and ambulates from ED. 

## 2015-07-12 ENCOUNTER — Encounter (HOSPITAL_COMMUNITY): Payer: Self-pay | Admitting: *Deleted

## 2015-07-12 ENCOUNTER — Emergency Department (HOSPITAL_COMMUNITY)
Admission: EM | Admit: 2015-07-12 | Discharge: 2015-07-12 | Disposition: A | Discharge status: Home / Self Care | Payer: Self-pay | Attending: Emergency Medicine | Admitting: Emergency Medicine

## 2015-07-12 ENCOUNTER — Emergency Department (HOSPITAL_COMMUNITY): Payer: Self-pay

## 2015-07-12 DIAGNOSIS — I10 Essential (primary) hypertension: Secondary | ICD-10-CM | POA: Insufficient documentation

## 2015-07-12 DIAGNOSIS — R0602 Shortness of breath: Secondary | ICD-10-CM | POA: Insufficient documentation

## 2015-07-12 DIAGNOSIS — G8929 Other chronic pain: Secondary | ICD-10-CM | POA: Insufficient documentation

## 2015-07-12 DIAGNOSIS — G40909 Epilepsy, unspecified, not intractable, without status epilepticus: Secondary | ICD-10-CM | POA: Insufficient documentation

## 2015-07-12 NOTE — ED Notes (Signed)
Pt called for room, no answer. Pt moved OTF

## 2015-07-12 NOTE — ED Notes (Signed)
PT states she started having sob when she got up this morning and reports headaches for the last 2-3 days.  Pt states history of migraines.  Pt states it hurts in her ears.

## 2015-07-12 NOTE — ED Notes (Signed)
Pt called for room, no answer.

## 2015-07-14 NOTE — ED Provider Notes (Signed)
I signed up to see patient but when she was called for room, no answer. Moved OTF.  Delos Haring, PA-C 07/14/15 [phone]

## 2015-07-27 ENCOUNTER — Emergency Department (HOSPITAL_COMMUNITY)
Admission: EM | Admit: 2015-07-27 | Discharge: 2015-07-27 | Disposition: A | Discharge status: Home / Self Care | Payer: Self-pay | Attending: Emergency Medicine | Admitting: Emergency Medicine

## 2015-07-27 ENCOUNTER — Encounter (HOSPITAL_COMMUNITY): Payer: Self-pay | Admitting: *Deleted

## 2015-07-27 ENCOUNTER — Emergency Department (HOSPITAL_COMMUNITY): Payer: Self-pay

## 2015-07-27 DIAGNOSIS — R112 Nausea with vomiting, unspecified: Secondary | ICD-10-CM | POA: Insufficient documentation

## 2015-07-27 DIAGNOSIS — G8929 Other chronic pain: Secondary | ICD-10-CM | POA: Insufficient documentation

## 2015-07-27 DIAGNOSIS — R52 Pain, unspecified: Secondary | ICD-10-CM

## 2015-07-27 DIAGNOSIS — R102 Pelvic and perineal pain: Secondary | ICD-10-CM | POA: Insufficient documentation

## 2015-07-27 DIAGNOSIS — Z862 Personal history of diseases of the blood and blood-forming organs and certain disorders involving the immune mechanism: Secondary | ICD-10-CM | POA: Insufficient documentation

## 2015-07-27 DIAGNOSIS — Z8719 Personal history of other diseases of the digestive system: Secondary | ICD-10-CM | POA: Insufficient documentation

## 2015-07-27 DIAGNOSIS — Z87440 Personal history of urinary (tract) infections: Secondary | ICD-10-CM | POA: Insufficient documentation

## 2015-07-27 DIAGNOSIS — Z87442 Personal history of urinary calculi: Secondary | ICD-10-CM | POA: Insufficient documentation

## 2015-07-27 DIAGNOSIS — N898 Other specified noninflammatory disorders of vagina: Secondary | ICD-10-CM | POA: Insufficient documentation

## 2015-07-27 DIAGNOSIS — Z8742 Personal history of other diseases of the female genital tract: Secondary | ICD-10-CM | POA: Insufficient documentation

## 2015-07-27 DIAGNOSIS — Z87891 Personal history of nicotine dependence: Secondary | ICD-10-CM | POA: Insufficient documentation

## 2015-07-27 DIAGNOSIS — Z880 Allergy status to penicillin: Secondary | ICD-10-CM | POA: Insufficient documentation

## 2015-07-27 DIAGNOSIS — Z3202 Encounter for pregnancy test, result negative: Secondary | ICD-10-CM | POA: Insufficient documentation

## 2015-07-27 LAB — CBC
HCT: 35.5 % — ABNORMAL LOW (ref 36.0–46.0)
Hemoglobin: 11.4 g/dL — ABNORMAL LOW (ref 12.0–15.0)
MCH: 26.4 pg (ref 26.0–34.0)
MCHC: 32.1 g/dL (ref 30.0–36.0)
MCV: 82.2 fL (ref 78.0–100.0)
Platelets: 317 10*3/uL (ref 150–400)
RBC: 4.32 MIL/uL (ref 3.87–5.11)
RDW: 15.2 % (ref 11.5–15.5)
WBC: 8 10*3/uL (ref 4.0–10.5)

## 2015-07-27 LAB — URINALYSIS, ROUTINE W REFLEX MICROSCOPIC
Bilirubin Urine: NEGATIVE
Glucose, UA: NEGATIVE mg/dL
Hgb urine dipstick: NEGATIVE
Ketones, ur: NEGATIVE mg/dL
Leukocytes, UA: NEGATIVE
Nitrite: NEGATIVE
Protein, ur: NEGATIVE mg/dL
Specific Gravity, Urine: 1.03 (ref 1.005–1.030)
pH: 6 (ref 5.0–8.0)

## 2015-07-27 LAB — COMPREHENSIVE METABOLIC PANEL
ALT: 13 U/L — ABNORMAL LOW (ref 14–54)
AST: 16 U/L (ref 15–41)
Albumin: 3.5 g/dL (ref 3.5–5.0)
Alkaline Phosphatase: 65 U/L (ref 38–126)
Anion gap: 11 (ref 5–15)
BUN: 8 mg/dL (ref 6–20)
CO2: 22 mmol/L (ref 22–32)
Calcium: 9.3 mg/dL (ref 8.9–10.3)
Chloride: 107 mmol/L (ref 101–111)
Creatinine, Ser: 0.63 mg/dL (ref 0.44–1.00)
GFR calc Af Amer: >60 mL/min (ref >60)
GFR calc non Af Amer: >60 mL/min (ref >60)
Glucose, Bld: 92 mg/dL (ref 65–99)
Potassium: 3.8 mmol/L (ref 3.5–5.1)
Sodium: 140 mmol/L (ref 135–145)
Total Bilirubin: 0.5 mg/dL (ref 0.3–1.2)
Total Protein: 6.8 g/dL (ref 6.5–8.1)

## 2015-07-27 LAB — WET PREP, GENITAL
Sperm: NONE SEEN
Trich, Wet Prep: NONE SEEN
Yeast Wet Prep HPF POC: NONE SEEN

## 2015-07-27 LAB — POC URINE PREG, ED: Preg Test, Ur: NEGATIVE

## 2015-07-27 LAB — LIPASE, BLOOD: Lipase: 25 U/L (ref 11–51)

## 2015-07-27 MED ORDER — TRAMADOL HCL 50 MG PO TABS
100.0000 mg | ORAL_TABLET | Freq: Once | ORAL | Status: AC
  Administered 2015-07-27: 100 mg via ORAL
  Filled 2015-07-27: qty 2

## 2015-07-27 MED ORDER — DOXYCYCLINE HYCLATE 100 MG PO TABS
100.0000 mg | ORAL_TABLET | Freq: Once | ORAL | Status: AC
  Administered 2015-07-27: 100 mg via ORAL
  Filled 2015-07-27: qty 1

## 2015-07-27 MED ORDER — METRONIDAZOLE 500 MG PO TABS
500.0000 mg | ORAL_TABLET | Freq: Once | ORAL | Status: AC
  Administered 2015-07-27: 500 mg via ORAL
  Filled 2015-07-27: qty 1

## 2015-07-27 MED ORDER — CEFTRIAXONE SODIUM 250 MG IJ SOLR
250.0000 mg | Freq: Once | INTRAMUSCULAR | Status: AC
  Administered 2015-07-27: 250 mg via INTRAMUSCULAR
  Filled 2015-07-27: qty 250

## 2015-07-27 MED ORDER — IBUPROFEN 400 MG PO TABS: 600.0000 mg | ORAL_TABLET | Freq: Once | ORAL | Status: DC | PRN

## 2015-07-27 MED ORDER — TRAMADOL HCL 50 MG PO TABS: 50.0000 mg | ORAL_TABLET | Freq: Four times a day (QID) | ORAL | Status: DC | PRN

## 2015-07-27 MED ORDER — DOXYCYCLINE HYCLATE 100 MG PO TABS: 100.0000 mg | ORAL_TABLET | Freq: Two times a day (BID) | ORAL | Status: DC

## 2015-07-27 MED ORDER — STERILE WATER FOR INJECTION IJ SOLN
INTRAMUSCULAR | Status: AC
  Filled 2015-07-27: qty 10

## 2015-07-27 MED ORDER — METRONIDAZOLE 500 MG PO TABS: 500.0000 mg | ORAL_TABLET | Freq: Two times a day (BID) | ORAL | Status: DC

## 2015-07-27 NOTE — ED Notes (Signed)
Patient transported to Ultrasound 

## 2015-07-27 NOTE — ED Notes (Signed)
Pt presents via POV c/o abdominal pain, describes as burning sensation x 2 days, reports N/V denies diarrhea.  Pt a x 4, NAD.

## 2015-07-27 NOTE — ED Provider Notes (Signed)
CSN: [id number]     Arrival date & time 07/27/15  0941 History   First MD Initiated Contact with Patient 07/27/15 [phone]     Chief Complaint  Patient presents with  . Abdominal Pain     (Consider location/radiation/quality/duration/timing/severity/associated sxs/prior Treatment) Patient is a 25 y.o. female presenting with abdominal pain.  Abdominal Pain Pain location:  Suprapubic Pain quality: aching and burning   Pain radiates to:  Does not radiate Pain severity:  Moderate Duration:  2 days Timing:  Constant Chronicity:  New Context: not alcohol use   Relieved by:  Nothing Worsened by:  Nothing tried Ineffective treatments:  NSAIDs Associated symptoms: nausea and vomiting   Associated symptoms: no chest pain, no chills, no constipation (bowel movements q2 days, formed), no diarrhea, no dysuria, no fatigue, no fever, no hematuria, no melena, no vaginal bleeding and no vaginal discharge   Risk factors: multiple surgeries       Past Medical History  Diagnosis Date  . UTI (lower urinary tract infection)   . Headache(784.0)     chronic  . Gallstones   . Hypertension     GHTN with last pg  . Pregnancy induced hypertension   . Anemia   . Kidney stones   . Ovarian cyst    Past Surgical History  Procedure Laterality Date  . Cholecystectomy N/A 07/27/2012    Procedure: LAPAROSCOPIC CHOLECYSTECTOMY WITH INTRAOPERATIVE CHOLANGIOGRAM;  Surgeon: Madilyn Hook, DO;  Location: [facility];  Service: General;  Laterality: N/A;  . Appendectomy    . Laparoscopic appendectomy N/A 12/09/2012    Procedure: APPENDECTOMY LAPAROSCOPIC;  Surgeon: Gwenyth Ober, MD;  Location: [facility];  Service: General;  Laterality: N/A;  . Diagnostic laparoscopy with removal of ectopic pregnancy N/A 12/27/2014    Procedure: DIAGNOSTIC LAPAROSCOPY, Right Salpingectomy with removal ECTOPIC PREGNANCY;  Surgeon: [name], MD;  Location: [facility];  Service: Gynecology;  Laterality: N/A;   Family History  Problem  Relation Age of Onset  . Asthma Mother   . Hypertension Mother   . Parkinsonism Mother   . Diabetes Maternal Grandmother   . Hypertension Maternal Grandmother   . Stroke Maternal Grandmother   . Heart disease Maternal Grandmother   . Anesthesia problems Neg Hx   . Malignant hyperthermia Neg Hx   . Pseudochol deficiency Neg Hx   . Hypotension Neg Hx   . Asthma Brother   . Lupus Maternal Aunt   . Heart disease Maternal Grandfather    Social History  Substance Use Topics  . Smoking status: Former Smoker    Quit date: 12/18/2011  . Smokeless tobacco: Never Used     Comment: 2014  . Alcohol Use: 0.0 oz/week     Comment: social   OB History    Gravida Para Term Preterm AB TAB SAB Ectopic Multiple Living   3 2 1 1  0 0 0 0 0 2     Review of Systems  Constitutional: Negative for fever, chills and fatigue.  Cardiovascular: Negative for chest pain.  Gastrointestinal: Positive for nausea, vomiting and abdominal pain. Negative for diarrhea, constipation (bowel movements q2 days, formed) and melena.  Genitourinary: Negative for dysuria, hematuria, vaginal bleeding and vaginal discharge.    Allergies  Amoxicillin; Contrast media; and Dilaudid  Home Medications   Prior to Admission medications   Not on File   BP 116/74 mmHg  Pulse 87  Temp(Src) 99.1 F (37.3 C) (Oral)  Resp 14  SpO2 99%  LMP 06/30/2015 Physical Exam  Constitutional: She is oriented to person, place, and time. She appears well-developed and well-nourished.  HENT:  Head: Normocephalic.  Eyes: Conjunctivae are normal. Pupils are equal, round, and reactive to light.  Neck: Normal range of motion. Neck supple.  Cardiovascular: Normal rate and regular rhythm.   No murmur heard. Pulmonary/Chest: Effort normal and breath sounds normal. No respiratory distress. She has no wheezes. She has no rales.  Abdominal: Soft. She exhibits no distension and no mass. There is tenderness (suprapubic). There is no rebound and  no guarding.  Genitourinary: Cervix exhibits motion tenderness (mild). Cervix exhibits no discharge and no friability. Right adnexum displays no mass, no tenderness and no fullness. Left adnexum displays no mass, no tenderness and no fullness. No erythema, tenderness or bleeding in the vagina. No foreign body around the vagina. Vaginal discharge found.  Musculoskeletal: Normal range of motion. She exhibits no edema or tenderness.  Lymphadenopathy:    She has no cervical adenopathy.  Neurological: She is alert and oriented to person, place, and time.  Skin: Skin is warm and dry. She is not diaphoretic.    ED Course  Procedures (including critical care time) Labs Review Labs Reviewed  WET PREP, GENITAL - Abnormal; Notable for the following:    Clue Cells Wet Prep HPF POC PRESENT (*)    WBC, Wet Prep HPF POC MANY (*)    All other components within normal limits  COMPREHENSIVE METABOLIC PANEL - Abnormal; Notable for the following:    ALT 13 (*)    All other components within normal limits  CBC - Abnormal; Notable for the following:    Hemoglobin 11.4 (*)    HCT 35.5 (*)    All other components within normal limits  LIPASE, BLOOD  URINALYSIS, ROUTINE W REFLEX MICROSCOPIC (NOT AT [facility])  RPR  HIV ANTIBODY (ROUTINE TESTING)  POC URINE PREG, ED  GC/CHLAMYDIA PROBE AMP ([place]) NOT AT [facility]    Imaging Review US Transvaginal Non-ob  07/27/2015  CLINICAL DATA:  Mid pelvic pain for 2 days. Patient reports surgery for previous ectopic pregnancy on the right. LMP 06/30/2015. EXAM: TRANSABDOMINAL AND TRANSVAGINAL ULTRASOUND OF PELVIS DOPPLER ULTRASOUND OF OVARIES TECHNIQUE: Both transabdominal and transvaginal ultrasound examinations of the pelvis were performed. Transabdominal technique was performed for global imaging of the pelvis including uterus, ovaries, adnexal regions, and pelvic cul-de-sac. It was necessary to proceed with endovaginal exam following the transabdominal exam to visualize  the endometrium. Color and duplex Doppler ultrasound was utilized to evaluate blood flow to the ovaries. COMPARISON:  Obstetric ultrasound 12/27/2014. FINDINGS: Uterus Measurements: 10.4 x 4.6 x 5.2 cm. No fibroids or other mass visualized. Endometrium Thickness: 8 mm.  No focal abnormality visualized. Right ovary Measurements: 4.0 x 2.5 x 1.9 cm. Small right ovarian follicle. No adnexal mass. Normal blood flow with color Doppler. Left ovary Measurements: 3.3 x 2.2 x 2.4 cm. Normal appearance/no adnexal mass. Normal blood flow with color Doppler. Pulsed Doppler evaluation of both ovaries demonstrates normal low-resistance arterial and venous waveforms. Other findings No abnormal free fluid. IMPRESSION: 1. Normal pelvic ultrasound.  No evidence of ovarian torsion. 2. No evidence of recurrent adnexal mass. The ovaries and uterus appear normal. Electronically Signed   By: Richardean Sale M.D.   On: 07/27/2015 15:27   US Pelvis Complete  07/27/2015  CLINICAL DATA:  Mid pelvic pain for 2 days. Patient reports surgery for previous ectopic pregnancy on the right. LMP 06/30/2015. EXAM: TRANSABDOMINAL AND TRANSVAGINAL ULTRASOUND OF PELVIS DOPPLER ULTRASOUND OF  OVARIES TECHNIQUE: Both transabdominal and transvaginal ultrasound examinations of the pelvis were performed. Transabdominal technique was performed for global imaging of the pelvis including uterus, ovaries, adnexal regions, and pelvic cul-de-sac. It was necessary to proceed with endovaginal exam following the transabdominal exam to visualize the endometrium. Color and duplex Doppler ultrasound was utilized to evaluate blood flow to the ovaries. COMPARISON:  Obstetric ultrasound 12/27/2014. FINDINGS: Uterus Measurements: 10.4 x 4.6 x 5.2 cm. No fibroids or other mass visualized. Endometrium Thickness: 8 mm.  No focal abnormality visualized. Right ovary Measurements: 4.0 x 2.5 x 1.9 cm. Small right ovarian follicle. No adnexal mass. Normal blood flow with color  Doppler. Left ovary Measurements: 3.3 x 2.2 x 2.4 cm. Normal appearance/no adnexal mass. Normal blood flow with color Doppler. Pulsed Doppler evaluation of both ovaries demonstrates normal low-resistance arterial and venous waveforms. Other findings No abnormal free fluid. IMPRESSION: 1. Normal pelvic ultrasound.  No evidence of ovarian torsion. 2. No evidence of recurrent adnexal mass. The ovaries and uterus appear normal. Electronically Signed   By: Richardean Sale M.D.   On: 07/27/2015 15:27   [place] Art/ven Flow Abd Pelv Doppler  07/27/2015  CLINICAL DATA:  Mid pelvic pain for 2 days. Patient reports surgery for previous ectopic pregnancy on the right. LMP 06/30/2015. EXAM: TRANSABDOMINAL AND TRANSVAGINAL ULTRASOUND OF PELVIS DOPPLER ULTRASOUND OF OVARIES TECHNIQUE: Both transabdominal and transvaginal ultrasound examinations of the pelvis were performed. Transabdominal technique was performed for global imaging of the pelvis including uterus, ovaries, adnexal regions, and pelvic cul-de-sac. It was necessary to proceed with endovaginal exam following the transabdominal exam to visualize the endometrium. Color and duplex Doppler ultrasound was utilized to evaluate blood flow to the ovaries. COMPARISON:  Obstetric ultrasound 12/27/2014. FINDINGS: Uterus Measurements: 10.4 x 4.6 x 5.2 cm. No fibroids or other mass visualized. Endometrium Thickness: 8 mm.  No focal abnormality visualized. Right ovary Measurements: 4.0 x 2.5 x 1.9 cm. Small right ovarian follicle. No adnexal mass. Normal blood flow with color Doppler. Left ovary Measurements: 3.3 x 2.2 x 2.4 cm. Normal appearance/no adnexal mass. Normal blood flow with color Doppler. Pulsed Doppler evaluation of both ovaries demonstrates normal low-resistance arterial and venous waveforms. Other findings No abnormal free fluid. IMPRESSION: 1. Normal pelvic ultrasound.  No evidence of ovarian torsion. 2. No evidence of recurrent adnexal mass. The ovaries and uterus  appear normal. Electronically Signed   By: Richardean Sale M.D.   On: 07/27/2015 15:27   I have personally reviewed and evaluated these images and lab results as part of my medical decision-making.   EKG Interpretation None      MDM   Final diagnoses:  Pelvic pain in female   Patient found to have BV on wet prep. Exam significant for mild CMT. Pain improved with Tramadol. Ultrasound not significant for uterine, ovarian or fallopian tube pathology. Patient states her bowel movements are normal. No melena or blood stools. Will treat BV with 7 days of metronidazole and treat possible PID with one dose of ceftriaxone followed by 14 days of doxycycline. Patient agrees with plan. Return precautions discussed. Stable for discharge home.    Mariel Aloe, MD 07/28/15 [place], MD 07/28/15 [phone]

## 2015-07-27 NOTE — Discharge Instructions (Signed)
We evaluated you and nothing significant could be found. You did have some tenderness of your cervix, so you will be treated for possible infection for two weeks. You were also found to have bacterial vaginosis, so you will be taking metronidazole for 7 days. Do not drink alcohol while taking this medication. If symptoms fail to improve, please return for follow-up

## 2015-07-28 LAB — HIV ANTIBODY (ROUTINE TESTING W REFLEX): HIV Screen 4th Generation wRfx: NONREACTIVE

## 2015-07-28 LAB — GC/CHLAMYDIA PROBE AMP (~~LOC~~) NOT AT ARMC
Chlamydia: NEGATIVE
Neisseria Gonorrhea: NEGATIVE

## 2015-07-28 LAB — RPR: RPR Ser Ql: NONREACTIVE

## 2015-09-23 ENCOUNTER — Emergency Department (HOSPITAL_COMMUNITY)
Admission: EM | Admit: 2015-09-23 | Discharge: 2015-09-23 | Disposition: A | Discharge status: Home / Self Care | Payer: Self-pay | Attending: Dermatology | Admitting: Dermatology

## 2015-09-23 ENCOUNTER — Encounter (HOSPITAL_COMMUNITY): Payer: Self-pay | Admitting: Emergency Medicine

## 2015-09-23 DIAGNOSIS — I10 Essential (primary) hypertension: Secondary | ICD-10-CM | POA: Insufficient documentation

## 2015-09-23 DIAGNOSIS — R1030 Lower abdominal pain, unspecified: Secondary | ICD-10-CM | POA: Insufficient documentation

## 2015-09-23 DIAGNOSIS — F17200 Nicotine dependence, unspecified, uncomplicated: Secondary | ICD-10-CM | POA: Insufficient documentation

## 2015-09-23 DIAGNOSIS — Z5321 Procedure and treatment not carried out due to patient leaving prior to being seen by health care provider: Secondary | ICD-10-CM | POA: Insufficient documentation

## 2015-09-23 NOTE — ED Notes (Signed)
Pt continues to come to [facility] requesting pain meds. Pt is informed she will need to see a Dr first. Pt is placed in a room and is informed when the room is cleaned she will be taken back to see a Dr. Abbott Pao states she has to leave and can not stay. Pt leaves AMA.

## 2015-09-23 NOTE — ED Notes (Addendum)
C/o R groin pain since 8pm.  Denies known injury.  Denies urinary complaint.  Pt states she took 5 Ibuprofen without relief.

## 2015-10-08 ENCOUNTER — Emergency Department (HOSPITAL_COMMUNITY)
Admission: EM | Admit: 2015-10-08 | Discharge: 2015-10-08 | Disposition: A | Discharge status: Home / Self Care | Payer: Self-pay | Attending: Emergency Medicine | Admitting: Emergency Medicine

## 2015-10-08 ENCOUNTER — Emergency Department (HOSPITAL_COMMUNITY): Payer: Self-pay

## 2015-10-08 ENCOUNTER — Encounter (HOSPITAL_COMMUNITY): Payer: Self-pay

## 2015-10-08 DIAGNOSIS — F17200 Nicotine dependence, unspecified, uncomplicated: Secondary | ICD-10-CM | POA: Insufficient documentation

## 2015-10-08 DIAGNOSIS — B349 Viral infection, unspecified: Secondary | ICD-10-CM | POA: Insufficient documentation

## 2015-10-08 MED ORDER — ALBUTEROL SULFATE HFA 108 (90 BASE) MCG/ACT IN AERS
2.0000 | INHALATION_SPRAY | Freq: Once | RESPIRATORY_TRACT | Status: AC
  Administered 2015-10-08: 2 via RESPIRATORY_TRACT
  Filled 2015-10-08: qty 6.7

## 2015-10-08 MED ORDER — KETOROLAC TROMETHAMINE 60 MG/2ML IM SOLN
60.0000 mg | Freq: Once | INTRAMUSCULAR | Status: AC
  Administered 2015-10-08: 60 mg via INTRAMUSCULAR
  Filled 2015-10-08: qty 2

## 2015-10-08 MED ORDER — FLUTICASONE PROPIONATE 50 MCG/ACT NA SUSP: 2.0000 | Freq: Every day | NASAL | 0 refills | Status: DC

## 2015-10-08 MED ORDER — IBUPROFEN 800 MG PO TABS: 800.0000 mg | ORAL_TABLET | Freq: Three times a day (TID) | ORAL | 0 refills | Status: DC | PRN

## 2015-10-08 NOTE — ED Provider Notes (Signed)
[facility] DEPT Provider Note   First Provider Contact:  12:19 PM  By signing my name below, I, Bea Graff, attest that this documentation has been prepared under the direction and in the presence of [facility], PA-C. Electronically Signed: Bea Graff, ED Scribe. 10/08/15. 12:28 PM.   History   Chief Complaint Chief Complaint  Patient presents with  . Cough  . Nasal Congestion   The history is provided by the patient and medical records. No language interpreter was used.    HPI Comments:  Emma Chambers is a 25 y.o. female who presents to the Emergency Department complaining of yellow nasal congestion, sore throat and nonproductive cough with some SOB that began two days ago. She reports associated nausea, vomiting (4 episodes total) and generalized body aches. She reports chest soreness from coughing and vomiting. She reports fever Tmax 102 degrees. She has taken Mucinex, Advil, Theraflu and NyQuil with no significant relief of the symptoms. She denies any known sick contacts. She denies modifying factors. She denies abdominal pain, vaginal discharge, vaginal bleeding, dysuria, hematuria, leg swelling.  Past Medical History:  Diagnosis Date  . Anemia   . Gallstones   . Headache(784.0)    chronic  . Hypertension    GHTN with last pg  . Kidney stones   . Ovarian cyst   . Pregnancy induced hypertension   . UTI (lower urinary tract infection)     Patient Active Problem List   Diagnosis Date Noted  . Ruptured ectopic pregnancy 12/27/2014  . 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 Generalized headaches 03/27/2011  . H. pylori infection 02/13/2011    Past Surgical History:  Procedure Laterality Date  . APPENDECTOMY    . CHOLECYSTECTOMY N/A 07/27/2012   Procedure: LAPAROSCOPIC CHOLECYSTECTOMY WITH INTRAOPERATIVE CHOLANGIOGRAM;  Surgeon: Madilyn Hook, DO;  Location: [facility];  Service: General;  Laterality: N/A;  . DIAGNOSTIC LAPAROSCOPY WITH REMOVAL OF ECTOPIC PREGNANCY  N/A 12/27/2014   Procedure: DIAGNOSTIC LAPAROSCOPY, Right Salpingectomy with removal ECTOPIC PREGNANCY;  Surgeon: [name], MD;  Location: [facility];  Service: Gynecology;  Laterality: N/A;  . LAPAROSCOPIC APPENDECTOMY N/A 12/09/2012   Procedure: APPENDECTOMY LAPAROSCOPIC;  Surgeon: Gwenyth Ober, MD;  Location: [facility];  Service: General;  Laterality: N/A;    OB History    Gravida Para Term Preterm AB Living   3 2 1 1  0 2   SAB TAB Ectopic Multiple Live Births   0 0 0 0         Home Medications    Prior to Admission medications   Medication Sig Start Date End Date Taking? Authorizing Provider  doxycycline (VIBRA-TABS) 100 MG tablet Take 1 tablet (100 mg total) by mouth 2 (two) times daily. 07/27/15   Mariel Aloe, MD  ibuprofen (ADVIL,MOTRIN) 200 MG tablet Take 400 mg by mouth every 6 (six) hours as needed (pain).    Historical Provider, MD  metroNIDAZOLE (FLAGYL) 500 MG tablet Take 1 tablet (500 mg total) by mouth 2 (two) times daily. 07/27/15   Mariel Aloe, MD  traMADol (ULTRAM) 50 MG tablet Take 1 tablet (50 mg total) by mouth every 6 (six) hours as needed. 07/27/15   Mariel Aloe, MD    Family History Family History  Problem Relation Age of Onset  . Asthma Mother   . Hypertension Mother   . Parkinsonism Mother   . Asthma Brother   . Diabetes Maternal Grandmother   . Hypertension Maternal Grandmother   . Stroke Maternal  Grandmother   . Heart disease Maternal Grandmother   . Lupus Maternal Aunt   . Heart disease Maternal Grandfather   . Anesthesia problems Neg Hx   . Malignant hyperthermia Neg Hx   . Pseudochol deficiency Neg Hx   . Hypotension Neg Hx     Social History Social History  Substance Use Topics  . Smoking status: Current Every Day Smoker  . Smokeless tobacco: Never Used     Comment: 2014  . Alcohol use 0.0 oz/week     Comment: social     Allergies   Amoxicillin; Contrast media [iodinated diagnostic agents]; and Dilaudid [hydromorphone  hcl]   Review of Systems Review of Systems  Constitutional: Positive for chills and fever.  HENT: Positive for congestion and sore throat.   Respiratory: Positive for cough and shortness of breath.   Gastrointestinal: Positive for nausea and vomiting. Negative for abdominal pain.  Genitourinary: Negative for dysuria, hematuria, vaginal bleeding and vaginal discharge.  Musculoskeletal: Positive for myalgias.  Allergic/Immunologic: Negative for immunocompromised state.  Hematological: Does not bruise/bleed easily.     Physical Exam Updated Vital Signs BP 118/74 (BP Location: Right Arm)   Pulse 92   Temp 99 F (37.2 C) (Oral)   Resp 20   LMP 09/09/2015   SpO2 100%   Physical Exam  Constitutional: She appears well-developed and well-nourished. No distress.  HENT:  Head: Normocephalic and atraumatic.  Mouth/Throat: Oropharynx is clear and moist. No oropharyngeal exudate.  Eyes: Conjunctivae are normal.  Neck: Neck supple.  Cardiovascular: Normal rate and regular rhythm.   Pulmonary/Chest: Effort normal. No respiratory distress. She has no wheezes. She has no rales.  Course breath sounds, particularly in left lower lobe.  Abdominal: Soft. She exhibits no distension. There is no tenderness. There is no rebound and no guarding.  Neurological: She is alert.  Skin: She is not diaphoretic.  Nursing note and vitals reviewed.    ED Treatments / Results  Labs (all labs ordered are listed, but only abnormal results are displayed) Labs Reviewed - No data to display  EKG  EKG Interpretation None       Radiology Dg Chest 2 View  Result Date: 10/08/2015 CLINICAL DATA:  Cough, vomiting, fever EXAM: CHEST  2 VIEW COMPARISON:  07/12/2015 FINDINGS: Lungs are clear.  No pleural effusion or pneumothorax. The heart is normal in size. Visualized osseous structures are within normal limits. Cholecystectomy clips. IMPRESSION: Normal chest radiographs. Electronically Signed   By: Julian Hy M.D.   On: 10/08/2015 12:48   Procedures Procedures (including critical care time)  Medications Ordered in ED Medications - No data to display   Initial Impression / Assessment and Plan / ED Course  I have reviewed the triage vital signs and the nursing notes.  Pertinent labs & imaging results that were available during my care of the patient were reviewed by me and considered in my medical decision making (see chart for details).  Clinical Course  DIAGNOSTIC STUDIES: Oxygen Saturation is 100% on RA, normal by my interpretation.   COORDINATION OF CARE: 12:26 PM- Will order CXR, MDI and Toradol injection. Pt verbalizes understanding and agrees to plan.  Medications  albuterol (PROVENTIL HFA;VENTOLIN HFA) 108 (90 Base) MCG/ACT inhaler 2 puff (2 puffs Inhalation Given 10/08/15 1239)  ketorolac (TORADOL) injection 60 mg (60 mg Intramuscular Given 10/08/15 1238)    Afebrile, nontoxic patient with constellation of symptoms suggestive of viral syndrome.  No concerning findings on exam.  Discharged home with supportive  care, PCP follow up.  Discussed result, findings, treatment, and follow up  with patient.  Pt given return precautions.  Pt verbalizes understanding and agrees with plan.      I personally performed the services described in this documentation, which was scribed in my presence. The recorded information has been reviewed and is accurate.    Final Clinical Impressions(s) / ED Diagnoses   Final diagnoses:  Viral infection    New Prescriptions Discharge Medication List as of 10/08/2015 12:56 PM    START taking these medications   Details  fluticasone (FLONASE) 50 MCG/ACT nasal spray Place 2 sprays into both nostrils daily., Starting Sun 10/08/2015, Print         Brady, PA-C 10/08/15 [place], MD 10/08/15 [phone]

## 2015-10-08 NOTE — Discharge Instructions (Signed)
Read the information below.  Use the prescribed medication as directed.  Please discuss all new medications with your pharmacist.  You may return to the Emergency Department at any time for worsening condition or any new symptoms that concern you.  If you develop high fevers that do not resolve with tylenol or ibuprofen, you have difficulty swallowing or breathing, or you are unable to tolerate fluids by mouth, return to the ER for a recheck.    °

## 2015-10-08 NOTE — ED Notes (Addendum)
Pt states she has been vomiting for 2 days. Abdomen "sore" from vomiting. PA informed. Denies diarrhea.

## 2015-10-21 ENCOUNTER — Encounter (HOSPITAL_COMMUNITY): Payer: Self-pay | Admitting: *Deleted

## 2015-10-21 ENCOUNTER — Inpatient Hospital Stay (HOSPITAL_COMMUNITY)
Admission: AD | Admit: 2015-10-21 | Discharge: 2015-10-21 | Discharge status: Against Medical Advice | Payer: Self-pay | Source: Ambulatory Visit | Attending: Obstetrics & Gynecology | Admitting: Obstetrics & Gynecology

## 2015-10-21 DIAGNOSIS — Z3202 Encounter for pregnancy test, result negative: Secondary | ICD-10-CM | POA: Insufficient documentation

## 2015-10-21 DIAGNOSIS — Z5321 Procedure and treatment not carried out due to patient leaving prior to being seen by health care provider: Secondary | ICD-10-CM | POA: Insufficient documentation

## 2015-10-21 DIAGNOSIS — N939 Abnormal uterine and vaginal bleeding, unspecified: Secondary | ICD-10-CM | POA: Insufficient documentation

## 2015-10-21 LAB — CBC WITH DIFFERENTIAL/PLATELET
Basophils Absolute: 0 10*3/uL (ref 0.0–0.1)
Basophils Relative: 0 %
Eosinophils Absolute: 0.6 10*3/uL (ref 0.0–0.7)
Eosinophils Relative: 6 %
HCT: 32 % — ABNORMAL LOW (ref 36.0–46.0)
Hemoglobin: 10.6 g/dL — ABNORMAL LOW (ref 12.0–15.0)
Lymphocytes Relative: 35 %
Lymphs Abs: 3.4 10*3/uL (ref 0.7–4.0)
MCH: 25.9 pg — ABNORMAL LOW (ref 26.0–34.0)
MCHC: 33.1 g/dL (ref 30.0–36.0)
MCV: 78.2 fL (ref 78.0–100.0)
Monocytes Absolute: 0.4 10*3/uL (ref 0.1–1.0)
Monocytes Relative: 4 %
Neutro Abs: 5.4 10*3/uL (ref 1.7–7.7)
Neutrophils Relative %: 55 %
Platelets: 303 10*3/uL (ref 150–400)
RBC: 4.09 MIL/uL (ref 3.87–5.11)
RDW: 15.2 % (ref 11.5–15.5)
WBC: 9.8 10*3/uL (ref 4.0–10.5)

## 2015-10-21 LAB — URINALYSIS, ROUTINE W REFLEX MICROSCOPIC
Bilirubin Urine: NEGATIVE
Glucose, UA: NEGATIVE mg/dL
Ketones, ur: NEGATIVE mg/dL
Leukocytes, UA: NEGATIVE
Nitrite: NEGATIVE
Protein, ur: NEGATIVE mg/dL
Specific Gravity, Urine: 1.015 (ref 1.005–1.030)
pH: 6 (ref 5.0–8.0)

## 2015-10-21 LAB — URINE MICROSCOPIC-ADD ON

## 2015-10-21 LAB — POCT PREGNANCY, URINE: Preg Test, Ur: NEGATIVE

## 2015-10-21 LAB — HCG, QUANTITATIVE, PREGNANCY: hCG, Beta Chain, Quant, S: <1 m[IU]/mL (ref <5)

## 2015-10-21 NOTE — MAU Note (Signed)
Pt was noted leaving MAU on lobby monitors.

## 2015-10-21 NOTE — MAU Provider Note (Signed)
History     CSN: [id number]  Arrival date and time: 10/21/15 0135   First Provider Initiated Contact with Patient 10/21/15 0153      Chief Complaint  Patient presents with  . Vaginal Bleeding  . Possible Pregnancy   HPI Ms. TAHINA MCKANE is a 25 y.o. [id number] at Unknown who presents to MAU today with complaint of vaginal bleeding off and on x 2 weeks and +HPT last month. The patient states LMP early June, which is different than what she told RN upon arrival. She states +HPT x 2 ~1 month ago. She states that she had bleeding during other pregnancies and wasn't concerned initially, but wanted further evaluation today. She denies abdominal pain, but is having bilateral "hip" pain. She denies vaginal discharge, fever, UTI symptoms diarrhea or constipation. She has had occasional N/V.   OB History    Gravida Para Term Preterm AB Living   3 2 1 1 1 2    SAB TAB Ectopic Multiple Live Births   0 0 1 0 2      Past Medical History:  Diagnosis Date  . Anemia   . Gallstones   . Headache(784.0)    chronic  . Hypertension    GHTN with last pg  . Kidney stones   . Ovarian cyst   . Pregnancy induced hypertension   . UTI (lower urinary tract infection)     Past Surgical History:  Procedure Laterality Date  . APPENDECTOMY    . CHOLECYSTECTOMY N/A 07/27/2012   Procedure: LAPAROSCOPIC CHOLECYSTECTOMY WITH INTRAOPERATIVE CHOLANGIOGRAM;  Surgeon: Madilyn Hook, DO;  Location: [facility];  Service: General;  Laterality: N/A;  . DIAGNOSTIC LAPAROSCOPY WITH REMOVAL OF ECTOPIC PREGNANCY N/A 12/27/2014   Procedure: DIAGNOSTIC LAPAROSCOPY, Right Salpingectomy with removal ECTOPIC PREGNANCY;  Surgeon: [name], MD;  Location: [facility];  Service: Gynecology;  Laterality: N/A;  . LAPAROSCOPIC APPENDECTOMY N/A 12/09/2012   Procedure: APPENDECTOMY LAPAROSCOPIC;  Surgeon: Gwenyth Ober, MD;  Location: [facility];  Service: General;  Laterality: N/A;    Family History  Problem Relation Age of Onset  .  Asthma Mother   . Hypertension Mother   . Parkinsonism Mother   . Asthma Brother   . Diabetes Maternal Grandmother   . Hypertension Maternal Grandmother   . Stroke Maternal Grandmother   . Heart disease Maternal Grandmother   . Lupus Maternal Aunt   . Heart disease Maternal Grandfather   . Anesthesia problems Neg Hx   . Malignant hyperthermia Neg Hx   . Pseudochol deficiency Neg Hx   . Hypotension Neg Hx     Social History  Substance Use Topics  . Smoking status: Current Every Day Smoker  . Smokeless tobacco: Never Used     Comment: 2014  . Alcohol use 0.0 oz/week     Comment: social    Allergies:  Allergies  Allergen Reactions  . Amoxicillin Nausea And Vomiting and Other (See Comments)    Tics & twitches Has patient had a PCN reaction causing immediate rash, facial/tongue/throat swelling, SOB or lightheadedness with hypotension: Yes Has patient had a PCN reaction causing severe rash involving mucus membranes or skin necrosis: No Has patient had a PCN reaction that required hospitalization No Has patient had a PCN reaction occurring within the last 10 years: Yes If all of the above answers are "NO", then may proceed with Cephalosporin use.  . Contrast Media [Iodinated Diagnostic Agents] Hives  . Dilaudid [Hydromorphone Hcl] Itching    No prescriptions  prior to admission.    Review of Systems  Constitutional: Negative for fever and malaise/fatigue.  Gastrointestinal: Positive for nausea and vomiting. Negative for abdominal pain, constipation and diarrhea.  Genitourinary: Negative for dysuria, frequency and urgency.       + spotting Neg - vaginal discharge   Physical Exam   Blood pressure 115/69, pulse 89, temperature 99.1 F (37.3 C), temperature source Oral, resp. rate 17, height 5\' 2"  (1.575 m), weight 230 lb (104.3 kg), last menstrual period 09/20/2015, SpO2 100 %, unknown if currently breastfeeding.  Physical Exam  Nursing note and vitals  reviewed. Constitutional: She is oriented to person, place, and time. She appears well-developed and well-nourished. No distress.  HENT:  Head: Normocephalic and atraumatic.  Cardiovascular: Normal rate.   Respiratory: Effort normal.  GI: Soft. She exhibits no distension.  Neurological: She is alert and oriented to person, place, and time.  Skin: Skin is warm and dry. No erythema.  Psychiatric: She has a normal mood and affect.    Results for orders placed or performed during the hospital encounter of 10/21/15 (from the past 24 hour(s))  Urinalysis, Routine w reflex microscopic (not at [facility])     Status: Abnormal   Collection Time: 10/21/15  1:45 AM  Result Value Ref Range   Color, Urine YELLOW YELLOW   APPearance CLEAR CLEAR   Specific Gravity, Urine 1.015 1.005 - 1.030   pH 6.0 5.0 - 8.0   Glucose, UA NEGATIVE NEGATIVE mg/dL   Hgb urine dipstick TRACE (A) NEGATIVE   Bilirubin Urine NEGATIVE NEGATIVE   Ketones, ur NEGATIVE NEGATIVE mg/dL   Protein, ur NEGATIVE NEGATIVE mg/dL   Nitrite NEGATIVE NEGATIVE   Leukocytes, UA NEGATIVE NEGATIVE  Urine microscopic-add on     Status: Abnormal   Collection Time: 10/21/15  1:45 AM  Result Value Ref Range   Squamous Epithelial / LPF 0-5 (A) NONE SEEN   WBC, UA 0-5 0 - 5 WBC/hpf   RBC / HPF 0-5 0 - 5 RBC/hpf   Bacteria, UA RARE (A) NONE SEEN  Pregnancy, urine POC     Status: None   Collection Time: 10/21/15  1:58 AM  Result Value Ref Range   Preg Test, Ur NEGATIVE NEGATIVE  CBC with Differential/Platelet     Status: Abnormal   Collection Time: 10/21/15  2:01 AM  Result Value Ref Range   WBC 9.8 4.0 - 10.5 K/uL   RBC 4.09 3.87 - 5.11 MIL/uL   Hemoglobin 10.6 (L) 12.0 - 15.0 g/dL   HCT 32.0 (L) 36.0 - 46.0 %   MCV 78.2 78.0 - 100.0 fL   MCH 25.9 (L) 26.0 - 34.0 pg   MCHC 33.1 30.0 - 36.0 g/dL   RDW 15.2 11.5 - 15.5 %   Platelets 303 150 - 400 K/uL   Neutrophils Relative % 55 %   Neutro Abs 5.4 1.7 - 7.7 K/uL   Lymphocytes Relative  35 %   Lymphs Abs 3.4 0.7 - 4.0 K/uL   Monocytes Relative 4 %   Monocytes Absolute 0.4 0.1 - 1.0 K/uL   Eosinophils Relative 6 %   Eosinophils Absolute 0.6 0.0 - 0.7 K/uL   Basophils Relative 0 %   Basophils Absolute 0.0 0.0 - 0.1 K/uL  hCG, quantitative, pregnancy     Status: None   Collection Time: 10/21/15  2:05 AM  Result Value Ref Range   hCG, Beta Chain, Quant, S <1 <5 mIU/mL    MAU Course  Procedures None  MDM UPT - negative Due to report of +HPT, quant hCG ordered CBC ordered Will await lab results prior to ordering [place], if +hCG will proceed with ectopic work-up Prior to receiving final lab results, patient left AMA.   Assessment and Plan  A: Negative pregnancy test Irregular bleeding   P: Prior to receiving final lab results, patient left AMA and did not consult me or RN staff prior to leaving   Luvenia Redden, PA-C  10/21/2015, 3:45 AM

## 2015-10-21 NOTE — MAU Note (Signed)
Pt reports LMP 09/20/2015, reports positive home pregnancy test x 2 at home. Now with bleeding off/on x 2 weeks. Tonight began having pain in her hips.

## 2015-11-20 ENCOUNTER — Encounter (HOSPITAL_COMMUNITY): Payer: Self-pay | Admitting: Emergency Medicine

## 2015-11-20 ENCOUNTER — Emergency Department (HOSPITAL_COMMUNITY)
Admission: EM | Admit: 2015-11-20 | Discharge: 2015-11-20 | Disposition: A | Discharge status: Home / Self Care | Payer: Self-pay | Attending: Emergency Medicine | Admitting: Emergency Medicine

## 2015-11-20 DIAGNOSIS — I10 Essential (primary) hypertension: Secondary | ICD-10-CM | POA: Insufficient documentation

## 2015-11-20 DIAGNOSIS — J029 Acute pharyngitis, unspecified: Secondary | ICD-10-CM

## 2015-11-20 DIAGNOSIS — F17200 Nicotine dependence, unspecified, uncomplicated: Secondary | ICD-10-CM | POA: Insufficient documentation

## 2015-11-20 DIAGNOSIS — R52 Pain, unspecified: Secondary | ICD-10-CM

## 2015-11-20 LAB — RAPID STREP SCREEN (MED CTR MEBANE ONLY): Streptococcus, Group A Screen (Direct): POSITIVE — AB

## 2015-11-20 MED ORDER — ONDANSETRON 4 MG PO TBDP
4.0000 mg | ORAL_TABLET | Freq: Once | ORAL | Status: AC
Start: 2015-11-20 — End: 2015-11-20
  Administered 2015-11-20: 4 mg via ORAL
  Filled 2015-11-20: qty 1

## 2015-11-20 MED ORDER — KETOROLAC TROMETHAMINE 30 MG/ML IJ SOLN
30.0000 mg | Freq: Once | INTRAMUSCULAR | Status: AC
  Administered 2015-11-20: 30 mg via INTRAMUSCULAR
  Filled 2015-11-20: qty 1

## 2015-11-20 MED ORDER — AZITHROMYCIN 250 MG PO TABS: ORAL_TABLET | ORAL | 0 refills | Status: DC

## 2015-11-20 MED ORDER — ONDANSETRON 4 MG PO TBDP: 4.0000 mg | ORAL_TABLET | Freq: Three times a day (TID) | ORAL | 0 refills | Status: DC | PRN

## 2015-11-20 MED ORDER — TRAMADOL HCL 50 MG PO TABS: 50.0000 mg | ORAL_TABLET | Freq: Four times a day (QID) | ORAL | 0 refills | Status: DC | PRN

## 2015-11-20 MED ORDER — IBUPROFEN 800 MG PO TABS
800.0000 mg | ORAL_TABLET | Freq: Three times a day (TID) | ORAL | 0 refills | Status: DC
Start: 2015-11-20 — End: 2016-05-15

## 2015-11-20 MED ORDER — DEXAMETHASONE 4 MG PO TABS
8.0000 mg | ORAL_TABLET | Freq: Once | ORAL | Status: AC
  Administered 2015-11-20: 8 mg via ORAL
  Filled 2015-11-20: qty 2

## 2015-11-20 NOTE — Discharge Instructions (Signed)
Take medication as needed. I will keep an eye on your rapid strep test and call in a prescription for antibiotics if it is positive.

## 2015-11-20 NOTE — ED Triage Notes (Signed)
Pt here with sore throat and body aches x 3 days

## 2015-11-20 NOTE — ED Notes (Signed)
Declined W/C at D/C and was escorted to lobby by RN. 

## 2015-11-20 NOTE — ED Provider Notes (Signed)
[facility] DEPT Provider Note   CSN: [id number] Arrival date & time: 11/20/15  1030    By signing my name below, I, Sonum Patel, attest that this documentation has been prepared under the direction and in the presence of Jolane Bankhead, PA-C. Electronically Signed: Sonum Patel, Education administrator. 11/20/15. 11:15 AM.  History   Chief Complaint Chief Complaint  Patient presents with  . Sore Throat  . Generalized Body Aches    The history is provided by the patient. No language interpreter was used.    HPI Comments: Emma Chambers is a 25 y.o. female who presents to the Emergency Department complaining of gradual onset, constant, gradually worsening sore throat and generalized myalgias that began 3 days ago. She reports associated decreased appetite, cough, nausea, and vomiting 1-2 times since yesterday. She has tried ibuprofen, gargled with warm water, and used a throat spray without relief. She denies sick contacts with similar symptoms. She denies diarrhea, abdominal pain, dysuria, urinary frequency. She has a history of cholecystectomy and appendectomy.    Past Medical History:  Diagnosis Date  . Anemia   . Gallstones   . Headache(784.0)    chronic  . Hypertension    GHTN with last pg  . Kidney stones   . Ovarian cyst   . Pregnancy induced hypertension   . UTI (lower urinary tract infection)     Patient Active Problem List   Diagnosis Date Noted  . Ruptured ectopic pregnancy 12/27/2014  . 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 Generalized headaches 03/27/2011  . H. pylori infection 02/13/2011    Past Surgical History:  Procedure Laterality Date  . APPENDECTOMY    . CHOLECYSTECTOMY N/A 07/27/2012   Procedure: LAPAROSCOPIC CHOLECYSTECTOMY WITH INTRAOPERATIVE CHOLANGIOGRAM;  Surgeon: Madilyn Hook, DO;  Location: [facility];  Service: General;  Laterality: N/A;  . DIAGNOSTIC LAPAROSCOPY WITH REMOVAL OF ECTOPIC PREGNANCY N/A 12/27/2014   Procedure: DIAGNOSTIC LAPAROSCOPY, Right Salpingectomy  with removal ECTOPIC PREGNANCY;  Surgeon: [name], MD;  Location: [facility];  Service: Gynecology;  Laterality: N/A;  . LAPAROSCOPIC APPENDECTOMY N/A 12/09/2012   Procedure: APPENDECTOMY LAPAROSCOPIC;  Surgeon: Gwenyth Ober, MD;  Location: [facility];  Service: General;  Laterality: N/A;    OB History    Gravida Para Term Preterm AB Living   3 2 1 1 1 2    SAB TAB Ectopic Multiple Live Births   0 0 1 0 2       Home Medications    Prior to Admission medications   Medication Sig Start Date End Date Taking? Authorizing Provider  doxycycline (VIBRA-TABS) 100 MG tablet Take 1 tablet (100 mg total) by mouth 2 (two) times daily. 07/27/15   Mariel Aloe, MD  fluticasone (FLONASE) 50 MCG/ACT nasal spray Place 2 sprays into both nostrils daily. 10/08/15   [name], PA-C  ibuprofen (ADVIL,MOTRIN) 800 MG tablet Take 1 tablet (800 mg total) by mouth every 8 (eight) hours as needed for mild pain or moderate pain. 10/08/15   [name], PA-C  metroNIDAZOLE (FLAGYL) 500 MG tablet Take 1 tablet (500 mg total) by mouth 2 (two) times daily. 07/27/15   Mariel Aloe, MD  traMADol (ULTRAM) 50 MG tablet Take 1 tablet (50 mg total) by mouth every 6 (six) hours as needed. 07/27/15   Mariel Aloe, MD    Family History Family History  Problem Relation Age of Onset  . Asthma Mother   . Hypertension Mother   . Parkinsonism Mother   . Asthma  Brother   . Diabetes Maternal Grandmother   . Hypertension Maternal Grandmother   . Stroke Maternal Grandmother   . Heart disease Maternal Grandmother   . Lupus Maternal Aunt   . Heart disease Maternal Grandfather   . Anesthesia problems Neg Hx   . Malignant hyperthermia Neg Hx   . Pseudochol deficiency Neg Hx   . Hypotension Neg Hx     Social History Social History  Substance Use Topics  . Smoking status: Current Every Day Smoker  . Smokeless tobacco: Never Used     Comment: 2014  . Alcohol use 0.0 oz/week     Comment: social     Allergies     Amoxicillin; Contrast media [iodinated diagnostic agents]; and Dilaudid [hydromorphone hcl]   Review of Systems Review of Systems  10 Systems reviewed and all are negative for acute change except as noted in the HPI.  Physical Exam Updated Vital Signs BP 109/75 (BP Location: Right Arm)   Pulse 89   Temp 99.1 F (37.3 C) (Oral)   Resp 20   LMP 09/20/2015   SpO2 99%   Physical Exam  Constitutional: She is oriented to person, place, and time. She appears well-developed and well-nourished.  Appears uncomfortable   HENT:  Head: Normocephalic and atraumatic.  Mouth/Throat: Uvula is midline. No trismus in the jaw. Posterior oropharyngeal erythema present. No oropharyngeal exudate.  Posterior oropharyngeal erythema. Tonsils mildly enlarged bilaterally. No exudate. Uvula midline. No trismus.   Neck: Normal range of motion. Neck supple.  Cardiovascular: Normal rate.   Pulmonary/Chest: Effort normal.  Abdominal: Soft. There is tenderness (mild, diffuse ). There is no rebound and no guarding.  Lymphadenopathy:    She has cervical adenopathy (right).  Neurological: She is alert and oriented to person, place, and time.  Skin: Skin is warm and dry.  Psychiatric: She has a normal mood and affect.  Nursing note and vitals reviewed.    ED Treatments / Results  DIAGNOSTIC STUDIES: Oxygen Saturation is 99% on RA, normal by my interpretation.    COORDINATION OF CARE: 11:16 AM Discussed treatment plan with pt at bedside and pt agreed to plan.    Labs (all labs ordered are listed, but only abnormal results are displayed) Labs Reviewed  RAPID STREP SCREEN (NOT AT [facility])    EKG  EKG Interpretation None       Radiology No results found.  Procedures Procedures (including critical care time)  Medications Ordered in ED Medications - No data to display   Initial Impression / Assessment and Plan / ED Course  I have reviewed the triage vital signs and the nursing  notes.  Pertinent labs & imaging results that were available during my care of the patient were reviewed by me and considered in my medical decision making (see chart for details).  Clinical Course   There was a lab delay in getting the rapid strep swab in process. 2/4 [facility] criteria. Given diffuse body aches and GI symptoms and pt's overall unwell feeling I do suspect pt has a virus, possibly the flu. However, >48h since symptom onset so will avoid use of Tamiflu. Pt does not want to wait for rapid strep to result. I will keep an eye on the results and call in prescription if necessary. Otherwise, pt is nontoxic appearing and hemodynamically stable. The patient appears reasonably screened and/or stabilized for discharge and I doubt any other medical condition or other [facility] requiring further screening, evaluation, or treatment in the ED at this  time prior to discharge. ER return precautions given.  Final Clinical Impressions(s) / ED Diagnoses   Final diagnoses:  Sore throat  Body aches    New Prescriptions New Prescriptions   IBUPROFEN (ADVIL,MOTRIN) 800 MG TABLET    Take 1 tablet (800 mg total) by mouth 3 (three) times daily.   ONDANSETRON (ZOFRAN ODT) 4 MG DISINTEGRATING TABLET    Take 1 tablet (4 mg total) by mouth every 8 (eight) hours as needed for nausea or vomiting.   TRAMADOL (ULTRAM) 50 MG TABLET    Take 1 tablet (50 mg total) by mouth every 6 (six) hours as needed for severe pain.   I personally performed the services described in this documentation, which was scribed in my presence. The recorded information has been reviewed and is accurate.    Anne Ng, PA-C 11/20/15 1224   As pt being discharged her rapid strep came back positive. States she is "very allergic" to amoxicillin and gets "bad hives" and starts throwing up. She states she has never taken penicillin and is afraid to try it. Will cover with course of azithromycin.    Anne Ng, PA-C 11/20/15 1236     Lacretia Leigh, MD 11/21/15 [phone]

## 2015-12-11 DIAGNOSIS — F17200 Nicotine dependence, unspecified, uncomplicated: Secondary | ICD-10-CM | POA: Insufficient documentation

## 2015-12-11 DIAGNOSIS — I10 Essential (primary) hypertension: Secondary | ICD-10-CM | POA: Insufficient documentation

## 2015-12-11 DIAGNOSIS — Z5321 Procedure and treatment not carried out due to patient leaving prior to being seen by health care provider: Secondary | ICD-10-CM | POA: Insufficient documentation

## 2015-12-11 DIAGNOSIS — M25561 Pain in right knee: Secondary | ICD-10-CM | POA: Insufficient documentation

## 2015-12-11 DIAGNOSIS — R51 Headache: Secondary | ICD-10-CM | POA: Insufficient documentation

## 2015-12-12 ENCOUNTER — Emergency Department (HOSPITAL_COMMUNITY)
Admission: EM | Admit: 2015-12-12 | Discharge: 2015-12-12 | Disposition: A | Discharge status: Home / Self Care | Payer: Self-pay | Attending: Emergency Medicine | Admitting: Emergency Medicine

## 2015-12-12 ENCOUNTER — Encounter (HOSPITAL_COMMUNITY): Payer: Self-pay | Admitting: Emergency Medicine

## 2015-12-12 LAB — CBC WITH DIFFERENTIAL/PLATELET
Basophils Absolute: 0 10*3/uL (ref 0.0–0.1)
Basophils Relative: 0 %
Eosinophils Absolute: 0.4 10*3/uL (ref 0.0–0.7)
Eosinophils Relative: 4 %
HCT: 34.5 % — ABNORMAL LOW (ref 36.0–46.0)
Hemoglobin: 10.8 g/dL — ABNORMAL LOW (ref 12.0–15.0)
Lymphocytes Relative: 29 %
Lymphs Abs: 3 10*3/uL (ref 0.7–4.0)
MCH: 25.7 pg — ABNORMAL LOW (ref 26.0–34.0)
MCHC: 31.3 g/dL (ref 30.0–36.0)
MCV: 82.1 fL (ref 78.0–100.0)
Monocytes Absolute: 0.6 10*3/uL (ref 0.1–1.0)
Monocytes Relative: 5 %
Neutro Abs: 6.5 10*3/uL (ref 1.7–7.7)
Neutrophils Relative %: 62 %
Platelets: 339 10*3/uL (ref 150–400)
RBC: 4.2 MIL/uL (ref 3.87–5.11)
RDW: 15.2 % (ref 11.5–15.5)
WBC: 10.5 10*3/uL (ref 4.0–10.5)

## 2015-12-12 LAB — BASIC METABOLIC PANEL
Anion gap: 7 (ref 5–15)
BUN: 13 mg/dL (ref 6–20)
CO2: 24 mmol/L (ref 22–32)
Calcium: 9.2 mg/dL (ref 8.9–10.3)
Chloride: 106 mmol/L (ref 101–111)
Creatinine, Ser: 0.63 mg/dL (ref 0.44–1.00)
GFR calc Af Amer: >60 mL/min (ref >60)
GFR calc non Af Amer: >60 mL/min (ref >60)
Glucose, Bld: 95 mg/dL (ref 65–99)
Potassium: 3.5 mmol/L (ref 3.5–5.1)
Sodium: 137 mmol/L (ref 135–145)

## 2015-12-12 NOTE — ED Notes (Signed)
Pt states that she does not want to stay. Encouraged pt that she didn't have much longer of a wait time, pt still refused to stay.

## 2015-12-12 NOTE — ED Triage Notes (Signed)
Pt. reports headache for 2 weeks , denies head injury , no emesis or photophobia , pt. added right knee pain for several days , denies injury /ambulatory .

## 2015-12-20 ENCOUNTER — Encounter (HOSPITAL_COMMUNITY): Payer: Self-pay | Admitting: Emergency Medicine

## 2015-12-20 ENCOUNTER — Emergency Department (HOSPITAL_COMMUNITY): Payer: Self-pay

## 2015-12-20 ENCOUNTER — Emergency Department (HOSPITAL_COMMUNITY)
Admission: EM | Admit: 2015-12-20 | Discharge: 2015-12-20 | Discharge status: Against Medical Advice | Payer: Self-pay | Attending: Emergency Medicine | Admitting: Emergency Medicine

## 2015-12-20 DIAGNOSIS — F17200 Nicotine dependence, unspecified, uncomplicated: Secondary | ICD-10-CM | POA: Insufficient documentation

## 2015-12-20 DIAGNOSIS — R519 Headache, unspecified: Secondary | ICD-10-CM

## 2015-12-20 DIAGNOSIS — R079 Chest pain, unspecified: Secondary | ICD-10-CM | POA: Insufficient documentation

## 2015-12-20 DIAGNOSIS — Z79899 Other long term (current) drug therapy: Secondary | ICD-10-CM | POA: Insufficient documentation

## 2015-12-20 DIAGNOSIS — I10 Essential (primary) hypertension: Secondary | ICD-10-CM | POA: Insufficient documentation

## 2015-12-20 DIAGNOSIS — R51 Headache: Secondary | ICD-10-CM | POA: Insufficient documentation

## 2015-12-20 LAB — BASIC METABOLIC PANEL WITH GFR
Anion gap: 10 (ref 5–15)
BUN: 11 mg/dL (ref 6–20)
CO2: 22 mmol/L (ref 22–32)
Calcium: 9.2 mg/dL (ref 8.9–10.3)
Chloride: 105 mmol/L (ref 101–111)
Creatinine, Ser: 0.64 mg/dL (ref 0.44–1.00)
GFR calc Af Amer: >60 mL/min
GFR calc non Af Amer: >60 mL/min
Glucose, Bld: 69 mg/dL (ref 65–99)
Potassium: 3.5 mmol/L (ref 3.5–5.1)
Sodium: 137 mmol/L (ref 135–145)

## 2015-12-20 LAB — CBC
HCT: 36.7 % (ref 36.0–46.0)
Hemoglobin: 11.6 g/dL — ABNORMAL LOW (ref 12.0–15.0)
MCH: 26 pg (ref 26.0–34.0)
MCHC: 31.6 g/dL (ref 30.0–36.0)
MCV: 82.3 fL (ref 78.0–100.0)
Platelets: 328 K/uL (ref 150–400)
RBC: 4.46 MIL/uL (ref 3.87–5.11)
RDW: 15.4 % (ref 11.5–15.5)
WBC: 9 K/uL (ref 4.0–10.5)

## 2015-12-20 MED ORDER — DEXAMETHASONE SODIUM PHOSPHATE 10 MG/ML IJ SOLN
10.0000 mg | Freq: Once | INTRAMUSCULAR | Status: AC
  Administered 2015-12-20: 10 mg via INTRAVENOUS
  Filled 2015-12-20: qty 1

## 2015-12-20 MED ORDER — METOCLOPRAMIDE HCL 5 MG/ML IJ SOLN
10.0000 mg | Freq: Once | INTRAMUSCULAR | Status: AC
  Administered 2015-12-20: 10 mg via INTRAVENOUS
  Filled 2015-12-20: qty 2

## 2015-12-20 MED ORDER — KETOROLAC TROMETHAMINE 30 MG/ML IJ SOLN
30.0000 mg | Freq: Once | INTRAMUSCULAR | Status: AC
  Administered 2015-12-20: 30 mg via INTRAVENOUS
  Filled 2015-12-20: qty 1

## 2015-12-20 MED ORDER — DIPHENHYDRAMINE HCL 50 MG/ML IJ SOLN
25.0000 mg | Freq: Once | INTRAMUSCULAR | Status: AC
  Administered 2015-12-20: 25 mg via INTRAVENOUS
  Filled 2015-12-20: qty 1

## 2015-12-20 MED ORDER — SODIUM CHLORIDE 0.9 % IV BOLUS (SEPSIS)
1000.0000 mL | Freq: Once | INTRAVENOUS | Status: AC
  Administered 2015-12-20: 1000 mL via INTRAVENOUS

## 2015-12-20 NOTE — ED Provider Notes (Signed)
[facility] DEPT Provider Note   CSN: [id number] Arrival date & time: 12/20/15  1053     History   Chief Complaint Chief Complaint  Patient presents with  . Headache  . Chest Pain    HPI ANESHIA VILAS is a 25 y.o. female.  Ms. Clack is a 25 yo woman with PMH anemia, chronic headache, HTN, frequent ED visits for somatic complaints who presents for 3 weeks of severe continuous right sided headache. Her headache has associated photo- and phono-phobia with no nausea/vomiting or vision changes or aura. The headache is not positional in nature and she denies congestion, tooth pain, or jaw claudication. She has tried Tylenol, Aleve, Excedrin, and Goody powder without significant symptom relief. 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 She state that she just "blacked out" and did not mention her shaking or convulsing. She denies fevers, chills, weight loss, abdominal pain, chest pain, or anxiety/depression.     Past Medical History:  Diagnosis Date  . Anemia   . Gallstones   . Headache(784.0)    chronic  . Hypertension    GHTN with last pg  . Kidney stones   . Ovarian cyst   . Pregnancy induced hypertension   . UTI (lower urinary tract infection)     Patient Active Problem List   Diagnosis Date Noted  . Ruptured ectopic pregnancy 12/27/2014  . 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 Generalized headaches 03/27/2011  . H. pylori infection 02/13/2011    Past Surgical History:  Procedure Laterality Date  . APPENDECTOMY    . CHOLECYSTECTOMY N/A 07/27/2012   Procedure: LAPAROSCOPIC CHOLECYSTECTOMY WITH INTRAOPERATIVE CHOLANGIOGRAM;  Surgeon: Madilyn Hook, DO;  Location: [facility];  Service: General;  Laterality: N/A;  . DIAGNOSTIC LAPAROSCOPY WITH REMOVAL OF ECTOPIC PREGNANCY N/A 12/27/2014   Procedure: DIAGNOSTIC LAPAROSCOPY,  Right Salpingectomy with removal ECTOPIC PREGNANCY;  Surgeon: [name], MD;  Location: [facility];  Service: Gynecology;  Laterality: N/A;  . LAPAROSCOPIC APPENDECTOMY N/A 12/09/2012   Procedure: APPENDECTOMY LAPAROSCOPIC;  Surgeon: Gwenyth Ober, MD;  Location: [facility];  Service: General;  Laterality: N/A;    OB History    Gravida Para Term Preterm AB Living   3 2 1 1 1 2    SAB TAB Ectopic Multiple Live Births   0 0 1 0 2       Home Medications    Prior to Admission medications   Medication Sig Start Date End Date Taking? Authorizing Provider  azithromycin (ZITHROMAX) 250 MG tablet Take first 2 tablets together the first day, then 1 every day until finished. 11/20/15   Olivia Canter Sam, PA-C  doxycycline (VIBRA-TABS) 100 MG tablet Take 1 tablet (100 mg total) by mouth 2 (two) times daily. 07/27/15   Mariel Aloe, MD  fluticasone (FLONASE) 50 MCG/ACT nasal spray Place 2 sprays into both nostrils daily. 10/08/15   [name], PA-C  ibuprofen (ADVIL,MOTRIN) 800 MG tablet Take 1 tablet (800 mg total) by mouth every 8 (eight) hours as needed for mild pain or moderate pain. 10/08/15   [name], PA-C  ibuprofen (ADVIL,MOTRIN) 800 MG tablet Take 1 tablet (800 mg total) by mouth 3 (three) times daily. 11/20/15   Olivia Canter Sam, PA-C  metroNIDAZOLE (FLAGYL) 500 MG tablet Take 1 tablet (500 mg total) by mouth 2 (two) times daily. 07/27/15  Mariel Aloe, MD  ondansetron (ZOFRAN ODT) 4 MG disintegrating tablet Take 1 tablet (4 mg total) by mouth every 8 (eight) hours as needed for nausea or vomiting. 11/20/15   Olivia Canter Sam, PA-C  traMADol (ULTRAM) 50 MG tablet Take 1 tablet (50 mg total) by mouth every 6 (six) hours as needed. 07/27/15   Mariel Aloe, MD  traMADol (ULTRAM) 50 MG tablet Take 1 tablet (50 mg total) by mouth every 6 (six) hours as needed for severe pain. 11/20/15   Anne Ng, PA-C    Family History Family History  Problem Relation Age of Onset  . Asthma Mother   . Hypertension Mother   .  Parkinsonism Mother   . Asthma Brother   . Diabetes Maternal Grandmother   . Hypertension Maternal Grandmother   . Stroke Maternal Grandmother   . Heart disease Maternal Grandmother   . Lupus Maternal Aunt   . Heart disease Maternal Grandfather   . Anesthesia problems Neg Hx   . Malignant hyperthermia Neg Hx   . Pseudochol deficiency Neg Hx   . Hypotension Neg Hx     Social History Social History  Substance Use Topics  . Smoking status: Current Every Day Smoker  . Smokeless tobacco: Never Used     Comment: 2014  . Alcohol use 0.0 oz/week     Comment: social     Allergies   Amoxicillin; Contrast media [iodinated diagnostic agents]; and Dilaudid [hydromorphone hcl]   Review of Systems Review of Systems  Constitutional: Negative for appetite change, chills, fever and unexpected weight change.  HENT: Negative for congestion.   Eyes: Positive for photophobia. Negative for pain and visual disturbance.  Respiratory: Positive for chest tightness and shortness of breath.   Cardiovascular: Negative for chest pain and palpitations.  Gastrointestinal: Negative for abdominal distention, constipation, diarrhea, nausea and vomiting.  Neurological: Positive for light-headedness and headaches. Negative for seizures, speech difficulty, weakness and numbness.  All other systems reviewed and are negative.    Physical Exam Updated Vital Signs BP 105/85 (BP Location: Right Arm)   Pulse 86   Temp 99.1 F (37.3 C) (Oral)   Resp 21   Ht 5\' 2"  (1.575 m)   Wt 104.3 kg   LMP 11/21/2015 (Exact Date)   SpO2 100%   BMI 42.07 kg/m   Physical Exam  Constitutional: She is oriented to person, place, and time. She appears well-developed and well-nourished. No distress.  HENT:  Head: Normocephalic and atraumatic.  Eyes: Conjunctivae and EOM are normal. Pupils are equal, round, and reactive to light.  No obvious papilledema on fundoscopic exam  Neck: Normal range of motion. Neck supple. No  thyromegaly present.  Cardiovascular: Normal rate, regular rhythm, normal heart sounds and intact distal pulses.   Pulmonary/Chest: Effort normal and breath sounds normal. No respiratory distress.  Abdominal: Soft. Bowel sounds are normal.  Musculoskeletal: Normal range of motion.  Neurological: She is alert and oriented to person, place, and time.  Skin: Skin is warm and dry. Capillary refill takes less than 2 seconds.  Psychiatric: She has a normal mood and affect. Thought content normal.  Vitals reviewed.   ED Treatments / Results  Labs (all labs ordered are listed, but only abnormal results are displayed) Labs Reviewed  CBC - Abnormal; Notable for the following:       Result Value   Hemoglobin 11.6 (*)    All other components within normal limits  [place], ED  EKG  EKG Interpretation None       Radiology Dg Chest 2 View  Result Date: 12/20/2015 CLINICAL DATA:  25 year old female with a history of headaches EXAM: CHEST  2 VIEW COMPARISON:  10/08/2015, 07/12/2015 FINDINGS: Cardiomediastinal silhouette within normal limits. No evidence of central vascular congestion. No confluent airspace disease or pneumothorax.  No pleural effusion. IMPRESSION: No radiographic evidence of acute cardiopulmonary disease. Signed, Dulcy Fanny. Earleen Newport, DO [facility] Radiology Electronically Signed   By: Corrie Mckusick D.O.   On: 12/20/2015 11:59    Procedures Procedures (including critical care time)  Medications Ordered in ED Medications - No data to display   Initial Impression / Assessment and Plan / ED Course  I have reviewed the triage vital signs and the nursing notes.  Pertinent labs & imaging results that were available during my care of the patient were reviewed by me and considered in my medical decision making (see chart for details).  Clinical Course    Ms. Electa Sniff is a 25 year old woman with  PMH anemia, chronic headache, HTN, obesity, and multiple ED visits in the past year for somatic complaints who presents with 3 weeks of right sided headache refractory to numerous OTC medications. No N/V, not related to position, no vision changes. She also reports an episode of fainting at work today with subjective shortness of breath. She is afebrile, hemodynamically stable, no exam findings. Labs, EKG, CXR normal. Suspect migraine vs medication overuse headache.   Provided migraine cocktail (Reglan IV 10, Decadron IV 10, Toradol IV 30, Benadryl IV 25) and 1L IVF.   4:05 PM went to check on patient, room empty. Patient apparently removed IV and left the ED without notifying any RN or provider.  Final Clinical Impressions(s) / ED Diagnoses   Final diagnoses:  None    New Prescriptions New Prescriptions   No medications on file     Asencion Partridge, MD 12/20/15 [place], MD 12/20/15 [id number]

## 2015-12-20 NOTE — ED Triage Notes (Signed)
Pt c/o headache intermittent for over a week-- right side of head, photophobic-- develioped chest heaviness while at work today-- "couldn't get my breath!"

## 2015-12-20 NOTE — ED Notes (Signed)
Patient informed staff she needed to leave. Prior to MD return to room for disposition patient removed her own IV and dressed and left the treatment area

## 2015-12-21 LAB — I-STAT TROPONIN, ED: Troponin i, poc: 0 ng/mL (ref 0.00–0.08)

## 2016-01-08 ENCOUNTER — Encounter (HOSPITAL_COMMUNITY): Payer: Self-pay

## 2016-01-08 ENCOUNTER — Emergency Department (HOSPITAL_COMMUNITY): Payer: Self-pay

## 2016-01-08 ENCOUNTER — Other Ambulatory Visit (HOSPITAL_COMMUNITY): Payer: Self-pay

## 2016-01-08 ENCOUNTER — Emergency Department (HOSPITAL_COMMUNITY)
Admission: EM | Admit: 2016-01-08 | Discharge: 2016-01-08 | Disposition: A | Discharge status: Home / Self Care | Payer: Self-pay | Attending: Emergency Medicine | Admitting: Emergency Medicine

## 2016-01-08 DIAGNOSIS — I10 Essential (primary) hypertension: Secondary | ICD-10-CM | POA: Insufficient documentation

## 2016-01-08 DIAGNOSIS — N939 Abnormal uterine and vaginal bleeding, unspecified: Secondary | ICD-10-CM

## 2016-01-08 DIAGNOSIS — Z79899 Other long term (current) drug therapy: Secondary | ICD-10-CM | POA: Insufficient documentation

## 2016-01-08 DIAGNOSIS — N938 Other specified abnormal uterine and vaginal bleeding: Secondary | ICD-10-CM | POA: Insufficient documentation

## 2016-01-08 DIAGNOSIS — F17200 Nicotine dependence, unspecified, uncomplicated: Secondary | ICD-10-CM | POA: Insufficient documentation

## 2016-01-08 DIAGNOSIS — R102 Pelvic and perineal pain: Secondary | ICD-10-CM | POA: Insufficient documentation

## 2016-01-08 DIAGNOSIS — Z7982 Long term (current) use of aspirin: Secondary | ICD-10-CM | POA: Insufficient documentation

## 2016-01-08 LAB — POC URINE PREG, ED: Preg Test, Ur: NEGATIVE

## 2016-01-08 LAB — I-STAT BETA HCG BLOOD, ED (MC, WL, AP ONLY): I-stat hCG, quantitative: <5 m[IU]/mL (ref <5)

## 2016-01-08 LAB — WET PREP, GENITAL
Clue Cells Wet Prep HPF POC: NONE SEEN
Sperm: NONE SEEN
Trich, Wet Prep: NONE SEEN
Yeast Wet Prep HPF POC: NONE SEEN

## 2016-01-08 LAB — HCG, QUANTITATIVE, PREGNANCY: hCG, Beta Chain, Quant, S: <1 m[IU]/mL (ref <5)

## 2016-01-08 NOTE — ED Notes (Signed)
Pt refused d/c vitals.

## 2016-01-08 NOTE — ED Triage Notes (Signed)
Pt.; stated that she took a pregnancy test September, approximately [redacted] weeks pregnant,  This morning she was at work and passed a large bloodclot, it felt like a size of an egg.  She is not bleeding any more.  She does have pressure.  Skin is warm and dry.

## 2016-01-08 NOTE — ED Notes (Signed)
Patient transported to Ultrasound 

## 2016-01-08 NOTE — Discharge Instructions (Signed)
Your tests today were normal.  No evidence of pregnancy today. Follow-up with the wom[facility] clinic for ongoing care. Return here for new concerns.

## 2016-01-08 NOTE — ED Provider Notes (Signed)
[facility] DEPT Provider Note   CSN: [id number] Arrival date & time: 01/08/16  1105     History   Chief Complaint Chief Complaint  Patient presents with  . Threatened Miscarriage    HPI Emma Chambers is a 25 y.o. female.  The history is provided by the patient and medical records.    25 year old female with history of anemia, gallstones, hypertension, ovarian cysts, presenting to the ED for possible miscarriage. Patient states she was seen in September for IUD placement at a clinic and was told that she was pregnant. She estimates she took proximally 10-12 urine pregnancy tests, all of which were positive. States by this time she thinks she would've been [redacted] weeks pregnant. States today at work she use the restroom and passed a large blood clot which was shaped like an egg.  States after that, no other issues.  No ongoing bleeding.  States she has not yet seen an Conservation officer, historic buildings.  She is G4P2.  Does have hx of ectopic in the past.  States she does have some vaginal discomfort as if "she just pushed something out".  Denies discharge or new sexual partners.  Past Medical History:  Diagnosis Date  . Anemia   . Gallstones   . Headache(784.0)    chronic  . Hypertension    GHTN with last pg  . Kidney stones   . Ovarian cyst   . Pregnancy induced hypertension   . UTI (lower urinary tract infection)     Patient Active Problem List   Diagnosis Date Noted  . Ruptured ectopic pregnancy 12/27/2014  . 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 Generalized headaches 03/27/2011  . H. pylori infection 02/13/2011    Past Surgical History:  Procedure Laterality Date  . APPENDECTOMY    . CHOLECYSTECTOMY N/A 07/27/2012   Procedure: LAPAROSCOPIC CHOLECYSTECTOMY WITH INTRAOPERATIVE CHOLANGIOGRAM;  Surgeon: Madilyn Hook, DO;  Location: [facility];  Service: General;  Laterality: N/A;  . DIAGNOSTIC LAPAROSCOPY WITH REMOVAL OF ECTOPIC PREGNANCY N/A 12/27/2014   Procedure: DIAGNOSTIC LAPAROSCOPY, Right  Salpingectomy with removal ECTOPIC PREGNANCY;  Surgeon: [name], MD;  Location: [facility];  Service: Gynecology;  Laterality: N/A;  . LAPAROSCOPIC APPENDECTOMY N/A 12/09/2012   Procedure: APPENDECTOMY LAPAROSCOPIC;  Surgeon: Gwenyth Ober, MD;  Location: [facility];  Service: General;  Laterality: N/A;    OB History    Gravida Para Term Preterm AB Living   4 2 1 1 1 2    SAB TAB Ectopic Multiple Live Births   0 0 1 0 2       Home Medications    Prior to Admission medications   Medication Sig Start Date End Date Taking? Authorizing Provider  acetaminophen (TYLENOL) 325 MG tablet Take 650 mg by mouth every 6 (six) hours as needed for mild pain.    Historical Provider, MD  aspirin-acetaminophen-caffeine (EXCEDRIN MIGRAINE) [phone] MG tablet Take 1 tablet by mouth every 6 (six) hours as needed for headache.    Historical Provider, MD  azithromycin (ZITHROMAX) 250 MG tablet Take first 2 tablets together the first day, then 1 every day until finished. Patient not taking: Reported on 12/20/2015 11/20/15   Olivia Canter Sam, PA-C  doxycycline (VIBRA-TABS) 100 MG tablet Take 1 tablet (100 mg total) by mouth 2 (two) times daily. Patient not taking: Reported on 12/20/2015 07/27/15   Mariel Aloe, MD  fluticasone [facility]) 50 MCG/ACT nasal spray Place 2 sprays into both nostrils daily. Patient not taking: Reported on 12/20/2015 10/08/15  [name], PA-C  ibuprofen (ADVIL,MOTRIN) 800 MG tablet Take 1 tablet (800 mg total) by mouth every 8 (eight) hours as needed for mild pain or moderate pain. Patient not taking: Reported on 12/20/2015 10/08/15   [name], PA-C  ibuprofen (ADVIL,MOTRIN) 800 MG tablet Take 1 tablet (800 mg total) by mouth 3 (three) times daily. Patient not taking: Reported on 12/20/2015 11/20/15   Olivia Canter Sam, PA-C  metroNIDAZOLE (FLAGYL) 500 MG tablet Take 1 tablet (500 mg total) by mouth 2 (two) times daily. Patient not taking: Reported on 12/20/2015 07/27/15   Mariel Aloe, MD  naproxen  sodium (ANAPROX) 220 MG tablet Take 220 mg by mouth 2 (two) times daily as needed (pain).    Historical Provider, MD  ondansetron (ZOFRAN ODT) 4 MG disintegrating tablet Take 1 tablet (4 mg total) by mouth every 8 (eight) hours as needed for nausea or vomiting. Patient not taking: Reported on 12/20/2015 11/20/15   Olivia Canter Sam, PA-C  traMADol (ULTRAM) 50 MG tablet Take 1 tablet (50 mg total) by mouth every 6 (six) hours as needed. Patient not taking: Reported on 12/20/2015 07/27/15   Mariel Aloe, MD  traMADol (ULTRAM) 50 MG tablet Take 1 tablet (50 mg total) by mouth every 6 (six) hours as needed for severe pain. Patient not taking: Reported on 12/20/2015 11/20/15   Anne Ng, PA-C    Family History Family History  Problem Relation Age of Onset  . Asthma Mother   . Hypertension Mother   . Parkinsonism Mother   . Asthma Brother   . Diabetes Maternal Grandmother   . Hypertension Maternal Grandmother   . Stroke Maternal Grandmother   . Heart disease Maternal Grandmother   . Lupus Maternal Aunt   . Heart disease Maternal Grandfather   . Anesthesia problems Neg Hx   . Malignant hyperthermia Neg Hx   . Pseudochol deficiency Neg Hx   . Hypotension Neg Hx     Social History Social History  Substance Use Topics  . Smoking status: Current Every Day Smoker  . Smokeless tobacco: Never Used     Comment: 2014  . Alcohol use 0.0 oz/week     Comment: social     Allergies   Amoxicillin; Contrast media [iodinated diagnostic agents]; and Dilaudid [hydromorphone hcl]   Review of Systems Review of Systems  Genitourinary: Positive for vaginal bleeding.  All other systems reviewed and are negative.    Physical Exam Updated Vital Signs BP 115/68 (BP Location: Left Arm)   Pulse 95   Temp 98.5 F (36.9 C) (Oral)   Resp 18   Ht 5\' 2"  (1.575 m)   Wt 108 kg   LMP 10/21/2015 (Approximate)   SpO2 100%   BMI 43.53 kg/m   Physical Exam  Constitutional: She is oriented to person, place,  and time. She appears well-developed and well-nourished.  HENT:  Head: Normocephalic and atraumatic.  Mouth/Throat: Oropharynx is clear and moist.  Eyes: Conjunctivae and EOM are normal. Pupils are equal, round, and reactive to light.  Neck: Normal range of motion.  Cardiovascular: Normal rate, regular rhythm and normal heart sounds.   Pulmonary/Chest: Effort normal and breath sounds normal.  Abdominal: Soft. Bowel sounds are normal.  Genitourinary:  Genitourinary Comments: Exam chaperoned by RN Normal female external genitalia without visible lesions, no vaginal bleeding noted; cervical os closed; mild amount of clear discharge noted; no adnexal or CMT  Musculoskeletal: Normal range of motion.  Neurological: She is alert and oriented  to person, place, and time.  Skin: Skin is warm and dry.  Psychiatric: She has a normal mood and affect.  Nursing note and vitals reviewed.    ED Treatments / Results  Labs (all labs ordered are listed, but only abnormal results are displayed) Labs Reviewed  WET PREP, GENITAL - Abnormal; Notable for the following:       Result Value   WBC, Wet Prep HPF POC MANY (*)    All other components within normal limits  HCG, QUANTITATIVE, PREGNANCY  I-STAT BETA HCG BLOOD, ED ([facility], [facility], [facility] ONLY)  POC URINE PREG, ED  GC/CHLAMYDIA PROBE AMP ([place]) NOT AT [facility]    EKG  EKG Interpretation None       Radiology US Pelvis Complete  Result Date: 01/08/2016 CLINICAL DATA:  Vaginal bleeding EXAM: TRANSABDOMINAL OF PELVIS TECHNIQUE: Both transabdominal and transvaginal ultrasound examinations of the pelvis were performed. Transabdominal technique was performed for global imaging of the pelvis including uterus, ovaries, adnexal regions, and pelvic cul-de-sac. It was necessary to proceed with endovaginal exam following the transabdominal exam to visualize the uterus, endometrium and ovaries. However, patient declined endovaginal imaging. COMPARISON:  07/17/2015  FINDINGS: Uterus Measurements: 10 x 3.9 x 5 cm. No fibroids or other mass visualized. Endometrium Thickness: Not visualized. Right ovary Measurements: Not visualized. Left ovary Measurements: a.m. 2.7 x 1.6 x 2.3 cm. Normal appearance/no adnexal mass. Other findings No abnormal free fluid. IMPRESSION: Limited transabdominal study of the pelvis. The right ovary was not visualized and endometrium not optimally characterized transabdominal. Patient deferred endovaginal imaging for better characterization. No conclusive findings for the patient's reported history of vaginal bleeding. Electronically Signed   By: Ashley Royalty M.D.   On: 01/08/2016 14:27    Procedures Procedures (including critical care time)  Medications Ordered in ED Medications - No data to display   Initial Impression / Assessment and Plan / ED Course  I have reviewed the triage vital signs and the nursing notes.  Pertinent labs & imaging results that were available during my care of the patient were reviewed by me and considered in my medical decision making (see chart for details).  --------------------------------------------------------  25 year old female here with vaginal bleeding. She reports several pregnancy tests last month which were positive. She estimates she would be around 6 weeks today. Her urine and blood pregnancy tests here today are negative. She does not have any vaginal bleeding on pelvic exam. No adnexal or cervical motion tenderness. Her cervical os is closed.  Patient's lab work is not suggestive of pregnancy at this time.  She does have hx of ectopic pregnancy in the past.  Ultrasound was obtained, however patient refused transvaginal imaging which limited study, no masses or other abnormalities noted. Question miscarriage since last month versus false positive testing. Recommended the patient follow-up closely with the women's clinic.  Discussed plan with patient, she acknowledged understanding and agreed with  plan of care.  Return precautions given for new or worsening symptoms.  Final Clinical Impressions(s) / ED Diagnoses   Final diagnoses:  Vaginal bleeding    New Prescriptions Discharge Medication List as of 01/08/2016  3:22 PM       Larene Pickett, PA-C 01/08/16 [place], MD 01/10/16 [phone]

## 2016-01-09 LAB — GC/CHLAMYDIA PROBE AMP (~~LOC~~) NOT AT ARMC
Chlamydia: NEGATIVE
Neisseria Gonorrhea: NEGATIVE

## 2016-03-06 IMAGING — US US OB COMP LESS 14 WK
1 series · 15 of 28 positions shown · non-contrast
Comparison: None.

ADDENDUM:
Addendum created to add additional history and findings.

Patient with intrauterine device placed [id number]. No intrauterine device
is seen on the current exam. Review of CT of the abdomen/pelvis October 07, 2014 demonstrated no intrauterine device present in the uterus
or abdominal cavity.
CLINICAL DATA: Vaginal bleeding in for treatments to pregnancy.
Abdominal cramping and vomiting for 3 days.
EXAM:
OBSTETRIC <14 WK US AND TRANSVAGINAL OB US
TECHNIQUE: Both transabdominal and transvaginal ultrasound examinations were
performed for complete evaluation of the gestation as well as the
maternal uterus, adnexal regions, and pelvic cul-de-sac.
Transvaginal technique was performed to assess early pregnancy.

[Series 1: us ob comp less 14 wk · 15 of 60 slices shown]
[im 1/60]
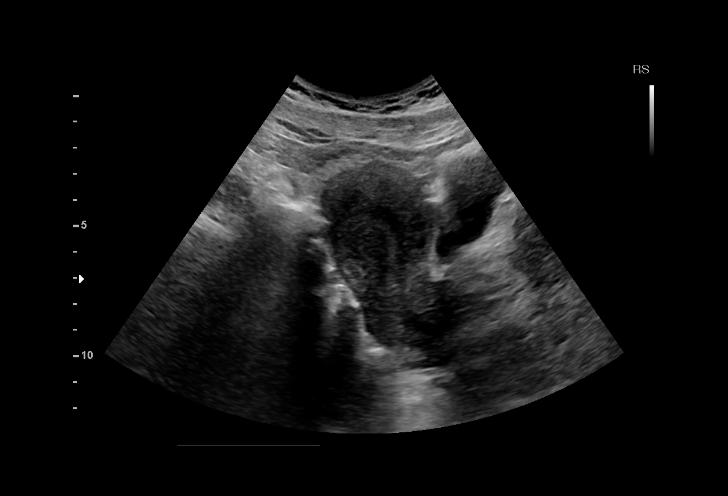
[im 5/60]
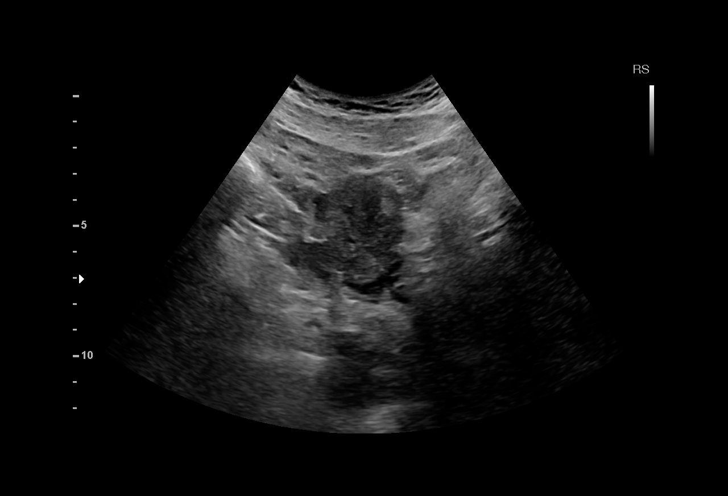
[im 9/60]
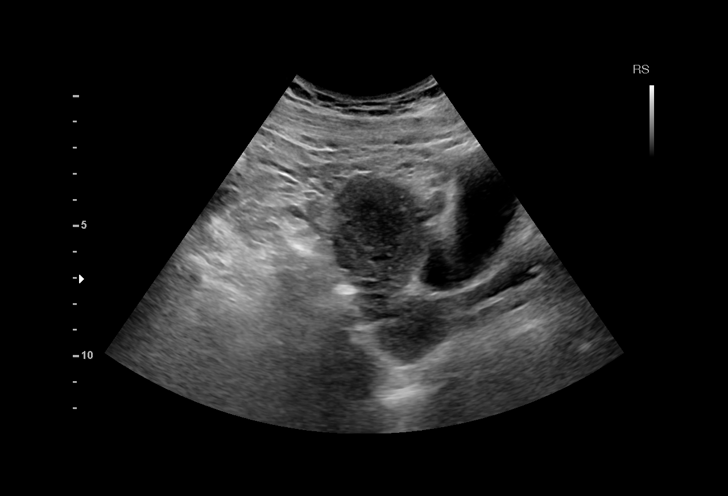
[im 14/60]
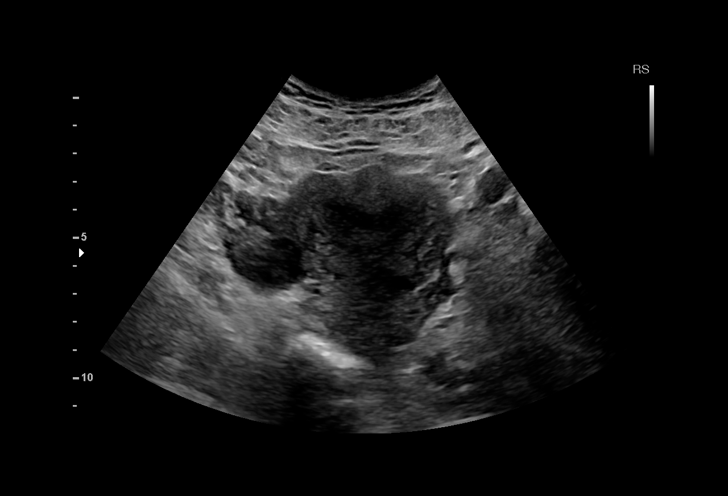
[im 18/60]
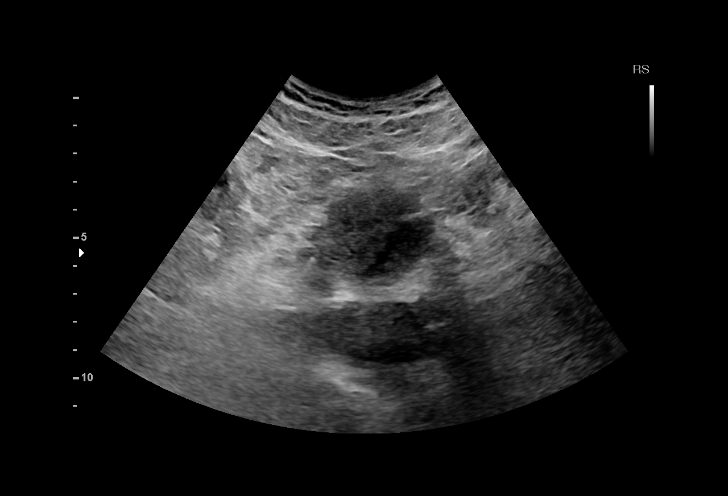
[im 22/60]
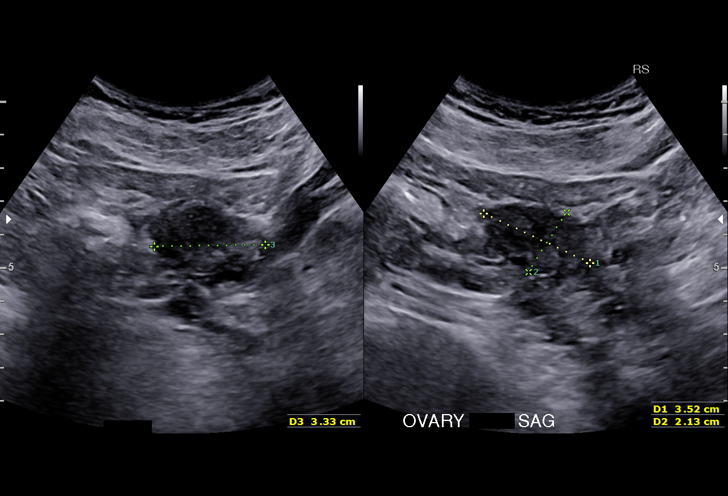
[im 27/60]
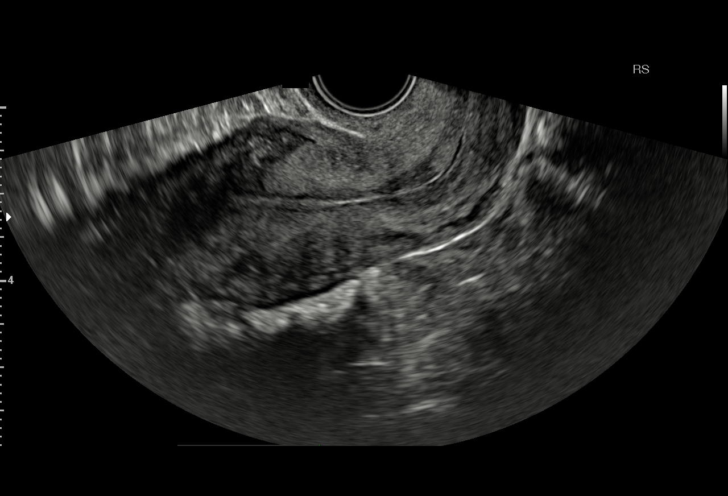
[im 31/60]
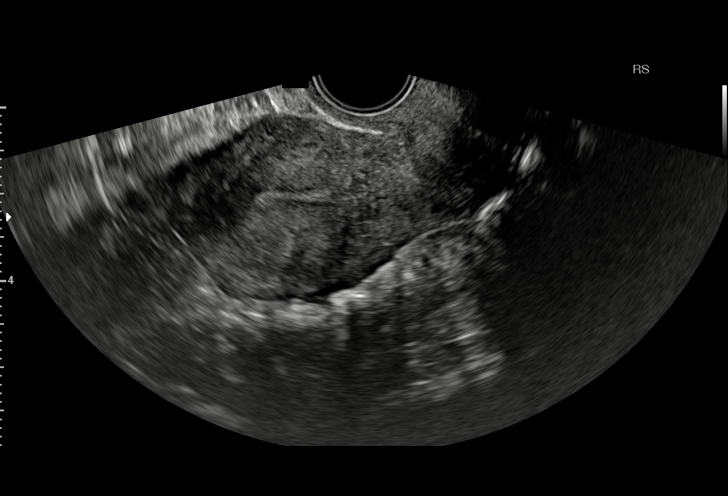
[im 33/60]
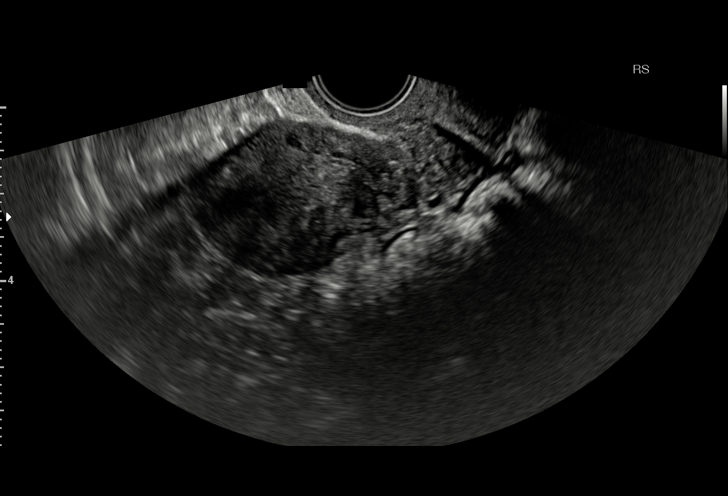
[im 38/60]
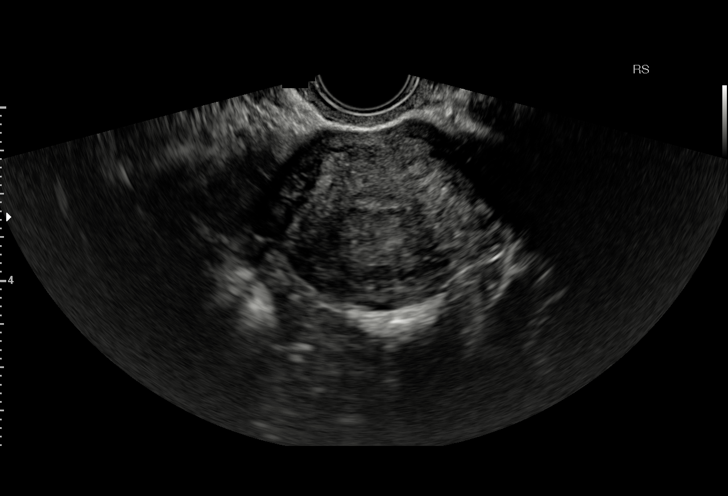
[im 42/60]
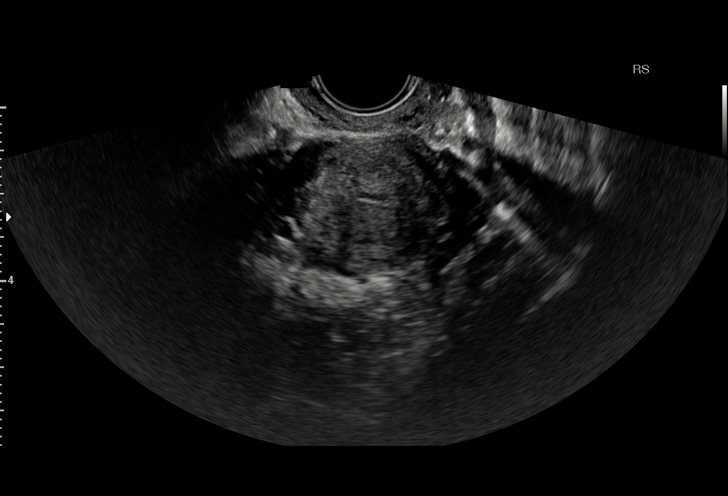
[im 46/60]
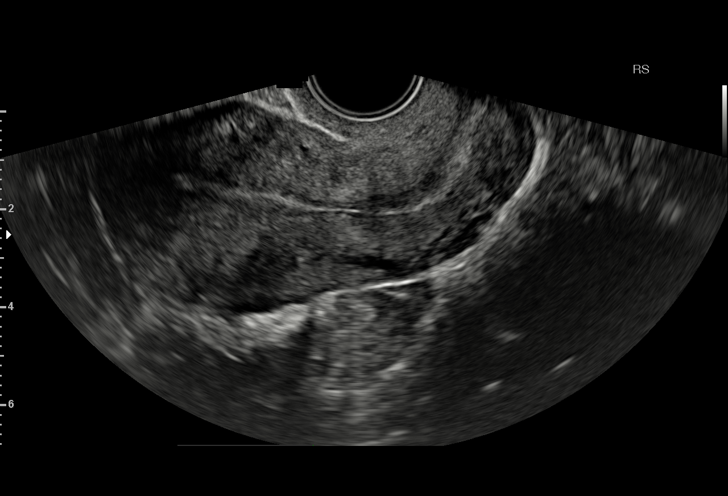
[im 51/60]
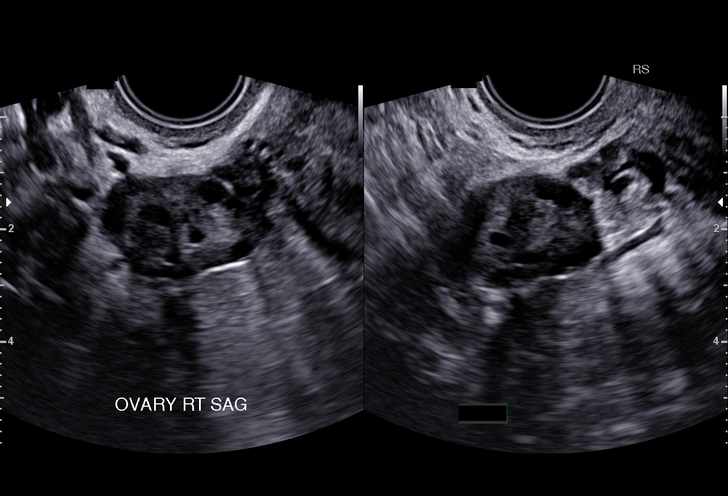
[im 55/60]
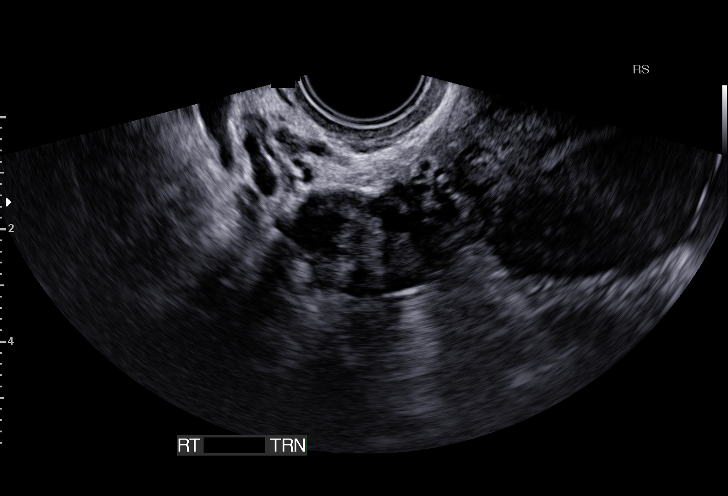
[im 60/60]
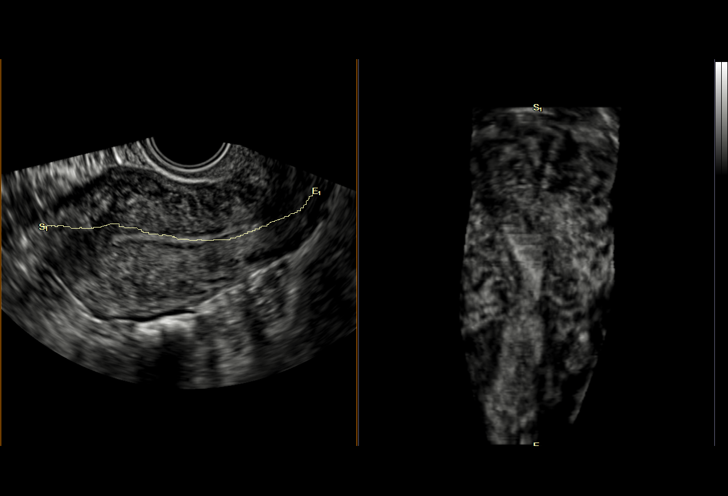

[15 of 28 positions shown; findings below may reference images not displayed]

FINDINGS: Intrauterine gestational sac: Not present.

Yolk sac:  Not present.

Embryo:  Not present.

Maternal uterus/adnexae: Endometrium is thin measuring 3 mm. No
fluid in the endometrial canal. The right ovary measures 3.1 x 1.9 x
2.4 cm and contains physiologic follicles. The left ovary measures
3.3 x 3.5 x 2.1 cm and appears normal. There is no adnexal mass.
There is no pelvic free fluid.
IMPRESSION: No intrauterine pregnancy. Endometrium is thin measuring 3 mm. No
findings of ectopic pregnancy, both ovaries are normal. There is no
adnexal mass or free fluid. Findings consistent with pregnancy of
unknown location. Early intrauterine or ectopic pregnancy not yet
detected sonographically are considered versus failed pregnancy.
Recommend follow-up quantitative B-HCG levels and follow-up US based
on B-HCG trending.

## 2016-04-16 ENCOUNTER — Encounter (HOSPITAL_COMMUNITY): Payer: Self-pay | Admitting: Emergency Medicine

## 2016-04-16 ENCOUNTER — Emergency Department (HOSPITAL_COMMUNITY)
Admission: EM | Admit: 2016-04-16 | Discharge: 2016-04-16 | Disposition: A | Discharge status: Home / Self Care | Payer: Self-pay | Attending: Emergency Medicine | Admitting: Emergency Medicine

## 2016-04-16 DIAGNOSIS — Z7982 Long term (current) use of aspirin: Secondary | ICD-10-CM | POA: Insufficient documentation

## 2016-04-16 DIAGNOSIS — R112 Nausea with vomiting, unspecified: Secondary | ICD-10-CM | POA: Insufficient documentation

## 2016-04-16 DIAGNOSIS — M545 Low back pain, unspecified: Secondary | ICD-10-CM

## 2016-04-16 DIAGNOSIS — R1013 Epigastric pain: Secondary | ICD-10-CM | POA: Insufficient documentation

## 2016-04-16 DIAGNOSIS — Z79899 Other long term (current) drug therapy: Secondary | ICD-10-CM | POA: Insufficient documentation

## 2016-04-16 DIAGNOSIS — I10 Essential (primary) hypertension: Secondary | ICD-10-CM | POA: Insufficient documentation

## 2016-04-16 DIAGNOSIS — F17200 Nicotine dependence, unspecified, uncomplicated: Secondary | ICD-10-CM | POA: Insufficient documentation

## 2016-04-16 LAB — COMPREHENSIVE METABOLIC PANEL
ALT: 12 U/L — ABNORMAL LOW (ref 14–54)
AST: 19 U/L (ref 15–41)
Albumin: 3.7 g/dL (ref 3.5–5.0)
Alkaline Phosphatase: 64 U/L (ref 38–126)
Anion gap: 5 (ref 5–15)
BUN: 7 mg/dL (ref 6–20)
CO2: 23 mmol/L (ref 22–32)
Calcium: 9.1 mg/dL (ref 8.9–10.3)
Chloride: 109 mmol/L (ref 101–111)
Creatinine, Ser: 0.67 mg/dL (ref 0.44–1.00)
GFR calc Af Amer: >60 mL/min (ref >60)
GFR calc non Af Amer: >60 mL/min (ref >60)
Glucose, Bld: 94 mg/dL (ref 65–99)
Potassium: 3.7 mmol/L (ref 3.5–5.1)
Sodium: 137 mmol/L (ref 135–145)
Total Bilirubin: 0.4 mg/dL (ref 0.3–1.2)
Total Protein: 7.1 g/dL (ref 6.5–8.1)

## 2016-04-16 LAB — URINALYSIS, ROUTINE W REFLEX MICROSCOPIC
Bacteria, UA: NONE SEEN
Bilirubin Urine: NEGATIVE
Glucose, UA: NEGATIVE mg/dL
Ketones, ur: NEGATIVE mg/dL
Leukocytes, UA: NEGATIVE
Nitrite: NEGATIVE
Protein, ur: NEGATIVE mg/dL
Specific Gravity, Urine: 1.023 (ref 1.005–1.030)
pH: 5 (ref 5.0–8.0)

## 2016-04-16 LAB — LIPASE, BLOOD: Lipase: 27 U/L (ref 11–51)

## 2016-04-16 LAB — CBC
HCT: 34.7 % — ABNORMAL LOW (ref 36.0–46.0)
Hemoglobin: 11 g/dL — ABNORMAL LOW (ref 12.0–15.0)
MCH: 25.6 pg — ABNORMAL LOW (ref 26.0–34.0)
MCHC: 31.7 g/dL (ref 30.0–36.0)
MCV: 80.7 fL (ref 78.0–100.0)
Platelets: 326 10*3/uL (ref 150–400)
RBC: 4.3 MIL/uL (ref 3.87–5.11)
RDW: 14.9 % (ref 11.5–15.5)
WBC: 9.5 10*3/uL (ref 4.0–10.5)

## 2016-04-16 LAB — POC URINE PREG, ED: Preg Test, Ur: NEGATIVE

## 2016-04-16 MED ORDER — GI COCKTAIL ~~LOC~~
30.0000 mL | Freq: Once | ORAL | Status: AC
  Administered 2016-04-16: 30 mL via ORAL
  Filled 2016-04-16: qty 30

## 2016-04-16 MED ORDER — OMEPRAZOLE 20 MG PO CPDR: 20.0000 mg | DELAYED_RELEASE_CAPSULE | Freq: Every day | ORAL | 0 refills | Status: DC

## 2016-04-16 NOTE — ED Notes (Signed)
Pt ambulated to restroom without distress. RN called mini lab. POC urine was negative for pregnancy.

## 2016-04-16 NOTE — ED Notes (Signed)
ED Provider at bedside. 

## 2016-04-16 NOTE — Discharge Instructions (Signed)
Please take Omeprazole as soon as you wake up 30 minutes before a meal. Drink plenty of fluids throughout the day. Stay away from ibuprofen, Motrin, Aleve, naproxen at this time. He can take Tylenol as needed for pain. Please avoid spicy, chocolate, mint, coffee, fatty foods. Please follow-up with your primary care provider in 2-5 days regarding today's visit.  Get help right away if: Your pain does not go away as soon as your health care provider told you to expect. You cannot stop throwing up. Your pain is only in areas of the abdomen, such as the right side or the left lower portion of the abdomen. You have bloody or black stools, or stools that look like tar. You have severe pain, cramping, or bloating in your abdomen. You have signs of dehydration, such as: Dark urine, very little urine, or no urine. Cracked lips. Dry mouth. Sunken eyes. Sleepiness. Weakness.

## 2016-04-16 NOTE — ED Triage Notes (Signed)
Pt c/o upper abdominal pain x3 days, pt also c/o lower back and R hip pain. Pt states shes been vomiting this morning 5 times. Denies diarrhea. Denies urinary symptoms. Pt in NAD

## 2016-04-17 NOTE — ED Provider Notes (Signed)
[facility] DEPT Provider Note   CSN: [id number] Arrival date & time: 04/16/16  [id number]     History   Chief Complaint Chief Complaint  Patient presents with  . Abdominal Pain    HPI Emma Chambers is a 26 y.o. female with history of kidney stones, ovarian cyst, low back pain presenting today with new upper abdominal pain and worsening lower back pain for 3 days. She describes her pain as gradually worsening, achy, constant, 8/10.  She reports associated emesis 5 times today. She denies diarrhea, urinary symptoms, fevers, chills, trauma, changes in bowel movements. She states she has tried NSAIDs at home with no relief. Patient admits to being able to eat and drink here in the ED with no episodes of vomiting. Patient admits to a surgical history of cholecystectomy, appendectomy, and laparoscopically with removal of ectopic pregnancy. She states to me that she just recently had her period about 2 weeks ago. Patient complains of worsening lower back pain that is consistent with her previous back pain, midline lower back, no radiation, no history of trauma, and states her abdominal pain seems to have worsened her lower back pain. She states both present at the same time.  The history is provided by the patient. No language interpreter was used.  Abdominal Pain   Associated symptoms include vomiting. Pertinent negatives include fever, diarrhea and dysuria.    Past Medical History:  Diagnosis Date  . Anemia   . Gallstones   . Headache(784.0)    chronic  . Hypertension    GHTN with last pg  . Kidney stones   . Ovarian cyst   . Pregnancy induced hypertension   . UTI (lower urinary tract infection)     Patient Active Problem List   Diagnosis Date Noted  . Ruptured ectopic pregnancy 12/27/2014  . 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 Generalized headaches 03/27/2011  . H. pylori infection 02/13/2011    Past Surgical History:  Procedure Laterality Date  . APPENDECTOMY    .  CHOLECYSTECTOMY N/A 07/27/2012   Procedure: LAPAROSCOPIC CHOLECYSTECTOMY WITH INTRAOPERATIVE CHOLANGIOGRAM;  Surgeon: Madilyn Hook, DO;  Location: [facility];  Service: General;  Laterality: N/A;  . DIAGNOSTIC LAPAROSCOPY WITH REMOVAL OF ECTOPIC PREGNANCY N/A 12/27/2014   Procedure: DIAGNOSTIC LAPAROSCOPY, Right Salpingectomy with removal ECTOPIC PREGNANCY;  Surgeon: [name], MD;  Location: [facility];  Service: Gynecology;  Laterality: N/A;  . LAPAROSCOPIC APPENDECTOMY N/A 12/09/2012   Procedure: APPENDECTOMY LAPAROSCOPIC;  Surgeon: Gwenyth Ober, MD;  Location: [facility];  Service: General;  Laterality: N/A;    OB History    Gravida Para Term Preterm AB Living   4 2 1 1 1 2    SAB TAB Ectopic Multiple Live Births   0 0 1 0 2       Home Medications    Prior to Admission medications   Medication Sig Start Date End Date Taking? Authorizing Provider  aspirin-acetaminophen-caffeine (EXCEDRIN MIGRAINE) [phone] MG tablet Take 1 tablet by mouth every 6 (six) hours as needed for headache.   Yes Historical Provider, MD  acetaminophen (TYLENOL) 325 MG tablet Take 650 mg by mouth every 6 (six) hours as needed for mild pain.    Historical Provider, MD  azithromycin (ZITHROMAX) 250 MG tablet Take first 2 tablets together the first day, then 1 every day until finished. Patient not taking: Reported on 12/20/2015 11/20/15   Olivia Canter Sam, PA-C  doxycycline (VIBRA-TABS) 100 MG tablet Take 1 tablet (100 mg total) by mouth 2 (  two) times daily. Patient not taking: Reported on 12/20/2015 07/27/15   Mariel Aloe, MD  fluticasone [facility]) 50 MCG/ACT nasal spray Place 2 sprays into both nostrils daily. Patient not taking: Reported on 12/20/2015 10/08/15   [name], PA-C  ibuprofen (ADVIL,MOTRIN) 800 MG tablet Take 1 tablet (800 mg total) by mouth every 8 (eight) hours as needed for mild pain or moderate pain. Patient not taking: Reported on 12/20/2015 10/08/15   [name], PA-C  ibuprofen (ADVIL,MOTRIN) 800 MG tablet  Take 1 tablet (800 mg total) by mouth 3 (three) times daily. Patient not taking: Reported on 12/20/2015 11/20/15   Olivia Canter Sam, PA-C  metroNIDAZOLE (FLAGYL) 500 MG tablet Take 1 tablet (500 mg total) by mouth 2 (two) times daily. Patient not taking: Reported on 12/20/2015 07/27/15   Mariel Aloe, MD  omeprazole (PRILOSEC) 20 MG capsule Take 1 capsule (20 mg total) by mouth daily. 04/16/16   Azura Tufaro Manuel Alaja Goldinger, PA  ondansetron (ZOFRAN ODT) 4 MG disintegrating tablet Take 1 tablet (4 mg total) by mouth every 8 (eight) hours as needed for nausea or vomiting. Patient not taking: Reported on 12/20/2015 11/20/15   Olivia Canter Sam, PA-C  traMADol (ULTRAM) 50 MG tablet Take 1 tablet (50 mg total) by mouth every 6 (six) hours as needed. Patient not taking: Reported on 12/20/2015 07/27/15   Mariel Aloe, MD  traMADol (ULTRAM) 50 MG tablet Take 1 tablet (50 mg total) by mouth every 6 (six) hours as needed for severe pain. Patient not taking: Reported on 12/20/2015 11/20/15   Anne Ng, PA-C    Family History Family History  Problem Relation Age of Onset  . Asthma Mother   . Hypertension Mother   . Parkinsonism Mother   . Asthma Brother   . Diabetes Maternal Grandmother   . Hypertension Maternal Grandmother   . Stroke Maternal Grandmother   . Heart disease Maternal Grandmother   . Lupus Maternal Aunt   . Heart disease Maternal Grandfather   . Anesthesia problems Neg Hx   . Malignant hyperthermia Neg Hx   . Pseudochol deficiency Neg Hx   . Hypotension Neg Hx     Social History Social History  Substance Use Topics  . Smoking status: Current Every Day Smoker  . Smokeless tobacco: Never Used     Comment: 2014  . Alcohol use 0.0 oz/week     Comment: social     Allergies   Amoxicillin; Contrast media [iodinated diagnostic agents]; and Dilaudid [hydromorphone hcl]   Review of Systems Review of Systems  Constitutional: Negative for chills and fever.  Respiratory: Negative for cough and  shortness of breath.   Cardiovascular: Negative for chest pain.  Gastrointestinal: Positive for abdominal pain and vomiting. Negative for diarrhea.  Genitourinary: Negative for difficulty urinating and dysuria.  Musculoskeletal: Positive for back pain. Negative for neck pain and neck stiffness.  Skin: Negative for rash and wound.  Neurological: Negative for numbness.  All other systems reviewed and are negative.    Physical Exam Updated Vital Signs BP 104/89 (BP Location: Right Arm)   Pulse 105   Temp 98.5 F (36.9 C) (Oral)   Resp 18   Ht 5' (1.524 m)   Wt 108.9 kg   LMP 11/21/2015 (Within Weeks)   SpO2 100%   Breastfeeding? Unknown   BMI 46.87 kg/m   Physical Exam  Constitutional: She is oriented to person, place, and time. She appears well-developed and well-nourished.  Well-appearing  HENT:  Head:  Normocephalic and atraumatic.  Nose: Nose normal.  Mouth/Throat: Oropharynx is clear and moist.  Eyes: Conjunctivae and EOM are normal. Pupils are equal, round, and reactive to light.  Neck: Normal range of motion. Neck supple.  Cardiovascular: Normal rate and normal heart sounds.   Pulmonary/Chest: Effort normal and breath sounds normal. No respiratory distress. She exhibits no tenderness.  CTA. Normal work of breathing.  Abdominal: Soft. Bowel sounds are normal. There is tenderness. There is guarding. There is no rebound.  Abdomen soft and tender and upper abdomen. Maximal tenderness at Epigastric region. Guarding present. Lower abdomen nontender. Negative Murphy sign. No focal tenderness at McBurney's point. No CVA tenderness.  Musculoskeletal: Normal range of motion.  Neurological: She is alert and oriented to person, place, and time.  Cranial Nerves:  III,IV, VI: ptosis not present, extra-ocular movements intact bilaterally, direct and consensual pupillary light reflexes intact bilaterally V: facial sensation, jaw opening, and bite strength equal bilaterally VII:  eyebrow raise, eyelid close, smile, frown, pucker equal bilaterally VIII: hearing grossly normal bilaterally  IX,X: palate elevation and swallowing intact XI: bilateral shoulder shrug and lateral head rotation equal and strong XII: midline tongue extension  Cerebellar tests negative.  Sensory intact.  Muscle strength 5/5 Patient able to ambulate without difficulty.   Skin: Skin is warm. Capillary refill takes less than 2 seconds. No rash noted.  Psychiatric: She has a normal mood and affect. Her behavior is normal.  Nursing note and vitals reviewed.    ED Treatments / Results  Labs (all labs ordered are listed, but only abnormal results are displayed) Labs Reviewed  COMPREHENSIVE METABOLIC PANEL - Abnormal; Notable for the following:       Result Value   ALT 12 (*)    All other components within normal limits  CBC - Abnormal; Notable for the following:    Hemoglobin 11.0 (*)    HCT 34.7 (*)    MCH 25.6 (*)    All other components within normal limits  URINALYSIS, ROUTINE W REFLEX MICROSCOPIC - Abnormal; Notable for the following:    APPearance HAZY (*)    Hgb urine dipstick SMALL (*)    Squamous Epithelial / LPF 0-5 (*)    All other components within normal limits  LIPASE, BLOOD  POC URINE PREG, ED    EKG  EKG Interpretation None       Radiology No results found.  Procedures Procedures (including critical care time)  Medications Ordered in ED Medications  gi cocktail (Maalox,Lidocaine,Donnatal) (30 mLs Oral Given 04/16/16 1122)     Initial Impression / Assessment and Plan / ED Course  I have reviewed the triage vital signs and the nursing notes.  Pertinent labs & imaging results that were available during my care of the patient were reviewed by me and considered in my medical decision making (see chart for details).    Patient with symptoms consistent with gastritis.  Vitals are stable, no fever or tachycardia.  Patient is nontoxic, nonseptic appearing, in  no apparent distress.    Pt's symptoms have been managed in the department.  No signs of dehydration, tolerating PO fluids > 6 oz.  Lungs are clear.  No peritoneal signs, no concern for appendicitis, cholecystitis, pancreatitis, ruptured viscus, UTI, kidney stone, PID, ectopic pregnancy.  Lab work is reassuring.  Patient felt better after GI cocktail. Patient afebrile, no apparent distress, vital signs stable. Supportive therapy indicated. Prescribed PPI to go home with with referral to primary care provider in 2-5 days  regarding today's visit. Patient counseled, expresses understanding and agrees with plan. Return precautions given.   Final Clinical Impressions(s) / ED Diagnoses   Final diagnoses:  Epigastric pain  Midline low back pain without sciatica, unspecified chronicity  Non-intractable vomiting with nausea, unspecified vomiting type    New Prescriptions Discharge Medication List as of 04/16/2016 12:14 PM    START taking these medications   Details  omeprazole (PRILOSEC) 20 MG capsule Take 1 capsule (20 mg total) by mouth daily., Starting Tue 04/16/2016, [place], [place] 04/17/16 [id number]    Dorie Rank, MD 04/17/16 1226

## 2016-04-27 ENCOUNTER — Emergency Department (HOSPITAL_COMMUNITY)
Admission: EM | Admit: 2016-04-27 | Discharge: 2016-04-27 | Disposition: A | Discharge status: Home / Self Care | Payer: Self-pay | Attending: Emergency Medicine | Admitting: Emergency Medicine

## 2016-04-27 ENCOUNTER — Encounter (HOSPITAL_COMMUNITY): Payer: Self-pay

## 2016-04-27 DIAGNOSIS — F17200 Nicotine dependence, unspecified, uncomplicated: Secondary | ICD-10-CM | POA: Insufficient documentation

## 2016-04-27 DIAGNOSIS — R112 Nausea with vomiting, unspecified: Secondary | ICD-10-CM | POA: Insufficient documentation

## 2016-04-27 DIAGNOSIS — I10 Essential (primary) hypertension: Secondary | ICD-10-CM | POA: Insufficient documentation

## 2016-04-27 DIAGNOSIS — Z7982 Long term (current) use of aspirin: Secondary | ICD-10-CM | POA: Insufficient documentation

## 2016-04-27 DIAGNOSIS — R197 Diarrhea, unspecified: Secondary | ICD-10-CM | POA: Insufficient documentation

## 2016-04-27 LAB — COMPREHENSIVE METABOLIC PANEL
ALT: 18 U/L (ref 14–54)
AST: 26 U/L (ref 15–41)
Albumin: 3.9 g/dL (ref 3.5–5.0)
Alkaline Phosphatase: 71 U/L (ref 38–126)
Anion gap: 9 (ref 5–15)
BUN: 9 mg/dL (ref 6–20)
CO2: 21 mmol/L — ABNORMAL LOW (ref 22–32)
Calcium: 9.3 mg/dL (ref 8.9–10.3)
Chloride: 109 mmol/L (ref 101–111)
Creatinine, Ser: 0.67 mg/dL (ref 0.44–1.00)
GFR calc Af Amer: >60 mL/min (ref >60)
GFR calc non Af Amer: >60 mL/min (ref >60)
Glucose, Bld: 104 mg/dL — ABNORMAL HIGH (ref 65–99)
Potassium: 3.7 mmol/L (ref 3.5–5.1)
Sodium: 139 mmol/L (ref 135–145)
Total Bilirubin: 0.2 mg/dL — ABNORMAL LOW (ref 0.3–1.2)
Total Protein: 7.5 g/dL (ref 6.5–8.1)

## 2016-04-27 LAB — CBC
HCT: 34.9 % — ABNORMAL LOW (ref 36.0–46.0)
Hemoglobin: 11.1 g/dL — ABNORMAL LOW (ref 12.0–15.0)
MCH: 25.8 pg — ABNORMAL LOW (ref 26.0–34.0)
MCHC: 31.8 g/dL (ref 30.0–36.0)
MCV: 81.2 fL (ref 78.0–100.0)
Platelets: 399 10*3/uL (ref 150–400)
RBC: 4.3 MIL/uL (ref 3.87–5.11)
RDW: 15.3 % (ref 11.5–15.5)
WBC: 11.1 10*3/uL — ABNORMAL HIGH (ref 4.0–10.5)

## 2016-04-27 LAB — URINALYSIS, ROUTINE W REFLEX MICROSCOPIC
Bacteria, UA: NONE SEEN
Bilirubin Urine: NEGATIVE
Glucose, UA: NEGATIVE mg/dL
Hgb urine dipstick: NEGATIVE
Ketones, ur: NEGATIVE mg/dL
Leukocytes, UA: NEGATIVE
Nitrite: NEGATIVE
Protein, ur: 100 mg/dL — AB
Specific Gravity, Urine: 1.023 (ref 1.005–1.030)
pH: 9 — ABNORMAL HIGH (ref 5.0–8.0)

## 2016-04-27 LAB — LIPASE, BLOOD: Lipase: 19 U/L (ref 11–51)

## 2016-04-27 MED ORDER — ONDANSETRON 4 MG PO TBDP
4.0000 mg | ORAL_TABLET | Freq: Once | ORAL | Status: DC | PRN
  Filled 2016-04-27: qty 1

## 2016-04-27 MED ORDER — SODIUM CHLORIDE 0.9 % IV BOLUS (SEPSIS)
2000.0000 mL | Freq: Once | INTRAVENOUS | Status: AC
  Administered 2016-04-27: 2000 mL via INTRAVENOUS

## 2016-04-27 MED ORDER — ONDANSETRON HCL 4 MG/2ML IJ SOLN
4.0000 mg | Freq: Once | INTRAMUSCULAR | Status: AC
  Administered 2016-04-27: 4 mg via INTRAVENOUS
  Filled 2016-04-27: qty 2

## 2016-04-27 NOTE — Discharge Instructions (Signed)
Start clear liquids today, then gradually advance to a bland diet tomorrow.  See a medical doctor, if needed, for problems.

## 2016-04-27 NOTE — ED Provider Notes (Signed)
[facility] DEPT Provider Note   CSN: [id number] Arrival date & time: 04/27/16  [id number]     History   Chief Complaint Chief Complaint  Patient presents with  . Abdominal Pain  . Nausea    HPI Emma Chambers is a 26 y.o. female.  She presents for evaluation of nausea, vomiting, diarrhea, which started this morning. She also has generalized achiness. She complains of crampy sensation in her abdomen. She works 2 different jobs. No specific known sick contacts. There are no other known modifying factors.  HPI  Past Medical History:  Diagnosis Date  . Anemia   . Gallstones   . Headache(784.0)    chronic  . Hypertension    GHTN with last pg  . Kidney stones   . Ovarian cyst   . Pregnancy induced hypertension   . UTI (lower urinary tract infection)     Patient Active Problem List   Diagnosis Date Noted  . Ruptured ectopic pregnancy 12/27/2014  . 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 Generalized headaches 03/27/2011  . H. pylori infection 02/13/2011    Past Surgical History:  Procedure Laterality Date  . APPENDECTOMY    . CHOLECYSTECTOMY N/A 07/27/2012   Procedure: LAPAROSCOPIC CHOLECYSTECTOMY WITH INTRAOPERATIVE CHOLANGIOGRAM;  Surgeon: Madilyn Hook, DO;  Location: [facility];  Service: General;  Laterality: N/A;  . DIAGNOSTIC LAPAROSCOPY WITH REMOVAL OF ECTOPIC PREGNANCY N/A 12/27/2014   Procedure: DIAGNOSTIC LAPAROSCOPY, Right Salpingectomy with removal ECTOPIC PREGNANCY;  Surgeon: [name], MD;  Location: [facility];  Service: Gynecology;  Laterality: N/A;  . LAPAROSCOPIC APPENDECTOMY N/A 12/09/2012   Procedure: APPENDECTOMY LAPAROSCOPIC;  Surgeon: Gwenyth Ober, MD;  Location: [facility];  Service: General;  Laterality: N/A;    OB History    Gravida Para Term Preterm AB Living   4 2 1 1 1 2    SAB TAB Ectopic Multiple Live Births   0 0 1 0 2       Home Medications    Prior to Admission medications   Medication Sig Start Date End Date Taking? Authorizing Provider    omeprazole (PRILOSEC) 20 MG capsule Take 1 capsule (20 mg total) by mouth daily. 04/16/16  Yes [place], [place]  acetaminophen (TYLENOL) 325 MG tablet Take 650 mg by mouth every 6 (six) hours as needed for mild pain.    Historical Provider, MD  aspirin-acetaminophen-caffeine (EXCEDRIN MIGRAINE) [phone] MG tablet Take 1 tablet by mouth every 6 (six) hours as needed for headache.    Historical Provider, MD  azithromycin (ZITHROMAX) 250 MG tablet Take first 2 tablets together the first day, then 1 every day until finished. Patient not taking: Reported on 12/20/2015 11/20/15   Olivia Canter Sam, PA-C  doxycycline (VIBRA-TABS) 100 MG tablet Take 1 tablet (100 mg total) by mouth 2 (two) times daily. Patient not taking: Reported on 12/20/2015 07/27/15   Mariel Aloe, MD  fluticasone [facility]) 50 MCG/ACT nasal spray Place 2 sprays into both nostrils daily. Patient not taking: Reported on 12/20/2015 10/08/15   [name], PA-C  ibuprofen (ADVIL,MOTRIN) 800 MG tablet Take 1 tablet (800 mg total) by mouth every 8 (eight) hours as needed for mild pain or moderate pain. Patient not taking: Reported on 12/20/2015 10/08/15   [name], PA-C  ibuprofen (ADVIL,MOTRIN) 800 MG tablet Take 1 tablet (800 mg total) by mouth 3 (three) times daily. Patient not taking: Reported on 12/20/2015 11/20/15   Olivia Canter Sam, PA-C  metroNIDAZOLE (FLAGYL) 500 MG tablet Take 1 tablet (  500 mg total) by mouth 2 (two) times daily. Patient not taking: Reported on 12/20/2015 07/27/15   Mariel Aloe, MD  ondansetron (ZOFRAN ODT) 4 MG disintegrating tablet Take 1 tablet (4 mg total) by mouth every 8 (eight) hours as needed for nausea or vomiting. Patient not taking: Reported on 12/20/2015 11/20/15   Olivia Canter Sam, PA-C  traMADol (ULTRAM) 50 MG tablet Take 1 tablet (50 mg total) by mouth every 6 (six) hours as needed. Patient not taking: Reported on 12/20/2015 07/27/15   Mariel Aloe, MD  traMADol (ULTRAM) 50 MG tablet Take 1 tablet (50 mg  total) by mouth every 6 (six) hours as needed for severe pain. Patient not taking: Reported on 12/20/2015 11/20/15   Anne Ng, PA-C    Family History Family History  Problem Relation Age of Onset  . Asthma Mother   . Hypertension Mother   . Parkinsonism Mother   . Asthma Brother   . Diabetes Maternal Grandmother   . Hypertension Maternal Grandmother   . Stroke Maternal Grandmother   . Heart disease Maternal Grandmother   . Lupus Maternal Aunt   . Heart disease Maternal Grandfather   . Anesthesia problems Neg Hx   . Malignant hyperthermia Neg Hx   . Pseudochol deficiency Neg Hx   . Hypotension Neg Hx     Social History Social History  Substance Use Topics  . Smoking status: Current Every Day Smoker  . Smokeless tobacco: Never Used     Comment: 2014  . Alcohol use 0.0 oz/week     Comment: social     Allergies   Amoxicillin; Contrast media [iodinated diagnostic agents]; and Dilaudid [hydromorphone hcl]   Review of Systems Review of Systems  All other systems reviewed and are negative.    Physical Exam Updated Vital Signs BP (!) 80/52   Pulse 88   Temp 98.6 F (37 C) (Oral)   Resp 19   Ht 5\' 2"  (1.575 m)   Wt 240 lb (108.9 kg)   LMP 04/18/2016   SpO2 100%   BMI 43.90 kg/m   Physical Exam  Constitutional: She is oriented to person, place, and time. She appears well-developed and well-nourished.  HENT:  Head: Normocephalic and atraumatic.  Eyes: Conjunctivae and EOM are normal. Pupils are equal, round, and reactive to light.  Neck: Normal range of motion and phonation normal. Neck supple.  Cardiovascular: Normal rate and regular rhythm.   Pulmonary/Chest: Effort normal and breath sounds normal. She exhibits no tenderness.  Abdominal: Soft. She exhibits no distension. There is no tenderness (Diffuse, mild). There is no guarding.  Musculoskeletal: Normal range of motion.  Neurological: She is alert and oriented to person, place, and time. She exhibits  normal muscle tone.  Skin: Skin is warm and dry.  Psychiatric: She has a normal mood and affect. Her behavior is normal. Judgment and thought content normal.  Nursing note and vitals reviewed.    ED Treatments / Results  Labs (all labs ordered are listed, but only abnormal results are displayed) Labs Reviewed  COMPREHENSIVE METABOLIC PANEL - Abnormal; Notable for the following:       Result Value   CO2 21 (*)    Glucose, Bld 104 (*)    Total Bilirubin 0.2 (*)    All other components within normal limits  CBC - Abnormal; Notable for the following:    WBC 11.1 (*)    Hemoglobin 11.1 (*)    HCT 34.9 (*)  MCH 25.8 (*)    All other components within normal limits  URINALYSIS, ROUTINE W REFLEX MICROSCOPIC - Abnormal; Notable for the following:    APPearance HAZY (*)    pH 9.0 (*)    Protein, ur 100 (*)    Squamous Epithelial / LPF 0-5 (*)    All other components within normal limits  LIPASE, BLOOD    EKG  EKG Interpretation None       Radiology No results found.  Procedures Procedures (including critical care time)  Medications Ordered in ED Medications  ondansetron (ZOFRAN-ODT) disintegrating tablet 4 mg (4 mg Oral Refused 04/27/16 0948)  sodium chloride 0.9 % bolus 2,000 mL (0 mLs Intravenous Stopped 04/27/16 1232)  ondansetron (ZOFRAN) injection 4 mg (4 mg Intravenous Given 04/27/16 1027)     Initial Impression / Assessment and Plan / ED Course  I have reviewed the triage vital signs and the nursing notes.  Pertinent labs & imaging results that were available during my care of the patient were reviewed by me and considered in my medical decision making (see chart for details).     Medications  ondansetron (ZOFRAN-ODT) disintegrating tablet 4 mg (4 mg Oral Refused 04/27/16 0948)  sodium chloride 0.9 % bolus 2,000 mL (0 mLs Intravenous Stopped 04/27/16 1232)  ondansetron (ZOFRAN) injection 4 mg (4 mg Intravenous Given 04/27/16 1027)    Patient Vitals for the  past 24 hrs:  BP Temp Temp src Pulse Resp SpO2 Height Weight  04/27/16 1132 (!) 80/52 - - 88 - 100 % - -  04/27/16 1115 (!) 89/53 - - 87 - 100 % - -  04/27/16 1045 91/77 - - 99 - 100 % - -  04/27/16 1030 108/64 - - 93 - 100 % - -  04/27/16 0945 119/71 - - 80 - 100 % - -  04/27/16 0930 122/66 - - 82 - 100 % - -  04/27/16 0818 - - - - - - 5\' 2"  (1.575 m) 240 lb (108.9 kg)  04/27/16 0752 120/56 98.6 F (37 C) Oral 105 19 100 % - -    1:07 PM Reevaluation with update and discussion. After initial assessment and treatment, an updated evaluation reveals She is comfortable now, tolerating oral fluids and has no further complaints. Findings discussed with the patient, all questions were answered. Hassaan Crite L    Final Clinical Impressions(s) / ED Diagnoses   Final diagnoses:  Nausea vomiting and diarrhea    Evaluation consistent with nonspecific, nausea, vomiting and diarrhea. Patient improved after treatment.  Nursing Notes Reviewed/ Care Coordinated Applicable Imaging Reviewed Interpretation of Laboratory Data incorporated into ED treatment  The patient appears reasonably screened and/or stabilized for discharge and I doubt any other medical condition or other [facility] requiring further screening, evaluation, or treatment in the ED at this time prior to discharge.  Plan: Home Medications- continue; Home Treatments- rest; return here if the recommended treatment, does not improve the symptoms; Recommended follow up- PCP prn   New Prescriptions New Prescriptions   No medications on file     Daleen Bo, MD 04/27/16 1310

## 2016-04-27 NOTE — ED Notes (Signed)
PO challenge tolerated well.  Nausea has passed.  Pt feels she is ready to go home.

## 2016-04-27 NOTE — ED Triage Notes (Signed)
Pt. Woke up at 0300 am with n/v/d.  She denies any blood in stool or emesis.  She has they symptoms last week but they resolved,  She denies any fever but is having chills.  She denies any sore throat or cough.  Is having body aches.  She took a Zofran at home prior to coming.  Skin is warm and dry.  Pt. Is alert and oriented X4

## 2016-04-27 NOTE — ED Notes (Signed)
Placed patient in a gown and on the monitor waiting for provider

## 2016-04-27 NOTE — ED Notes (Signed)
Asked patient to get a urine sample pt stated that she could not get one will try later

## 2016-05-15 ENCOUNTER — Emergency Department (HOSPITAL_COMMUNITY)
Admission: EM | Admit: 2016-05-15 | Discharge: 2016-05-15 | Disposition: A | Discharge status: Home / Self Care | Payer: Self-pay | Attending: Emergency Medicine | Admitting: Emergency Medicine

## 2016-05-15 ENCOUNTER — Emergency Department (HOSPITAL_COMMUNITY): Payer: Self-pay

## 2016-05-15 ENCOUNTER — Encounter (HOSPITAL_COMMUNITY): Payer: Self-pay

## 2016-05-15 DIAGNOSIS — F17200 Nicotine dependence, unspecified, uncomplicated: Secondary | ICD-10-CM | POA: Insufficient documentation

## 2016-05-15 DIAGNOSIS — R112 Nausea with vomiting, unspecified: Secondary | ICD-10-CM | POA: Insufficient documentation

## 2016-05-15 DIAGNOSIS — I10 Essential (primary) hypertension: Secondary | ICD-10-CM | POA: Insufficient documentation

## 2016-05-15 DIAGNOSIS — R1084 Generalized abdominal pain: Secondary | ICD-10-CM | POA: Insufficient documentation

## 2016-05-15 DIAGNOSIS — R109 Unspecified abdominal pain: Secondary | ICD-10-CM

## 2016-05-15 LAB — COMPREHENSIVE METABOLIC PANEL
ALT: 12 U/L — ABNORMAL LOW (ref 14–54)
AST: 18 U/L (ref 15–41)
Albumin: 3.7 g/dL (ref 3.5–5.0)
Alkaline Phosphatase: 58 U/L (ref 38–126)
Anion gap: 7 (ref 5–15)
BUN: 8 mg/dL (ref 6–20)
CO2: 24 mmol/L (ref 22–32)
Calcium: 8.9 mg/dL (ref 8.9–10.3)
Chloride: 105 mmol/L (ref 101–111)
Creatinine, Ser: 0.61 mg/dL (ref 0.44–1.00)
GFR calc Af Amer: >60 mL/min (ref >60)
GFR calc non Af Amer: >60 mL/min (ref >60)
Glucose, Bld: 99 mg/dL (ref 65–99)
Potassium: 3.9 mmol/L (ref 3.5–5.1)
Sodium: 136 mmol/L (ref 135–145)
Total Bilirubin: 0.4 mg/dL (ref 0.3–1.2)
Total Protein: 6.6 g/dL (ref 6.5–8.1)

## 2016-05-15 LAB — CBC
HCT: 35.4 % — ABNORMAL LOW (ref 36.0–46.0)
Hemoglobin: 11.3 g/dL — ABNORMAL LOW (ref 12.0–15.0)
MCH: 26 pg (ref 26.0–34.0)
MCHC: 31.9 g/dL (ref 30.0–36.0)
MCV: 81.4 fL (ref 78.0–100.0)
Platelets: 322 10*3/uL (ref 150–400)
RBC: 4.35 MIL/uL (ref 3.87–5.11)
RDW: 15 % (ref 11.5–15.5)
WBC: 7.1 10*3/uL (ref 4.0–10.5)

## 2016-05-15 LAB — URINALYSIS, ROUTINE W REFLEX MICROSCOPIC
Bacteria, UA: NONE SEEN
Bilirubin Urine: NEGATIVE
Glucose, UA: NEGATIVE mg/dL
Ketones, ur: NEGATIVE mg/dL
Leukocytes, UA: NEGATIVE
Nitrite: NEGATIVE
Protein, ur: NEGATIVE mg/dL
Specific Gravity, Urine: 1.02 (ref 1.005–1.030)
pH: 7 (ref 5.0–8.0)

## 2016-05-15 LAB — I-STAT BETA HCG BLOOD, ED (MC, WL, AP ONLY): I-stat hCG, quantitative: <5 m[IU]/mL (ref <5)

## 2016-05-15 LAB — LIPASE, BLOOD: Lipase: 22 U/L (ref 11–51)

## 2016-05-15 MED ORDER — ONDANSETRON HCL 4 MG/2ML IJ SOLN
4.0000 mg | Freq: Once | INTRAMUSCULAR | Status: AC
  Administered 2016-05-15: 4 mg via INTRAVENOUS
  Filled 2016-05-15: qty 2

## 2016-05-15 MED ORDER — PROMETHAZINE HCL 25 MG PO TABS: 25.0000 mg | ORAL_TABLET | Freq: Four times a day (QID) | ORAL | 0 refills | Status: DC | PRN

## 2016-05-15 MED ORDER — SODIUM CHLORIDE 0.9 % IV BOLUS (SEPSIS)
1000.0000 mL | Freq: Once | INTRAVENOUS | Status: AC
  Administered 2016-05-15: 1000 mL via INTRAVENOUS

## 2016-05-15 MED ORDER — OMEPRAZOLE 20 MG PO CPDR: 20.0000 mg | DELAYED_RELEASE_CAPSULE | Freq: Every day | ORAL | 0 refills | Status: DC

## 2016-05-15 MED ORDER — DICYCLOMINE HCL 20 MG PO TABS: 20.0000 mg | ORAL_TABLET | Freq: Two times a day (BID) | ORAL | 0 refills | Status: DC

## 2016-05-15 NOTE — ED Notes (Signed)
Patient resting on stretcher , sprite given

## 2016-05-15 NOTE — ED Provider Notes (Signed)
[facility] DEPT Provider Note   CSN: [id number] Arrival date & time: 05/15/16  [id number]     History   Chief Complaint Chief Complaint  Patient presents with  . Abdominal Pain  . Emesis    HPI Emma Chambers is a 26 y.o. female.  Patient with h/o cholecystectomy, appendectomy, ectopic pregnancy -- presents with several weeks of abdominal pain and cramping, vomiting, watery nonbloody stool which is worse in the nighttime hours. Symptoms were worse last night with multiple episodes of vomiting and upper abdominal pain. Patient has had ED visits for this dating to at least 04/16/16. She has been taking a PPI and Zofran without relief. She denies heavy NSAID or alcohol use. No chest pain or shortness of breath. No fevers or urinary symptoms. No vaginal bleeding or discharge. Patient expects her next period to start on 05/20/16. She has not seen anyone other than emergency department for these problems. The onset of this condition is chronic. The course is constant. Aggravating factors: none. Alleviating factors: none.        Past Medical History:  Diagnosis Date  . Anemia   . Gallstones   . Headache(784.0)    chronic  . Hypertension    GHTN with last pg  . Kidney stones   . Ovarian cyst   . Pregnancy induced hypertension   . UTI (lower urinary tract infection)     Patient Active Problem List   Diagnosis Date Noted  . Ruptured ectopic pregnancy 12/27/2014  . 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 Generalized headaches 03/27/2011  . H. pylori infection 02/13/2011    Past Surgical History:  Procedure Laterality Date  . APPENDECTOMY    . CHOLECYSTECTOMY N/A 07/27/2012   Procedure: LAPAROSCOPIC CHOLECYSTECTOMY WITH INTRAOPERATIVE CHOLANGIOGRAM;  Surgeon: Madilyn Hook, DO;  Location: [facility];  Service: General;  Laterality: N/A;  . DIAGNOSTIC LAPAROSCOPY WITH REMOVAL OF ECTOPIC PREGNANCY N/A 12/27/2014   Procedure: DIAGNOSTIC LAPAROSCOPY, Right Salpingectomy with removal ECTOPIC  PREGNANCY;  Surgeon: [name], MD;  Location: [facility];  Service: Gynecology;  Laterality: N/A;  . LAPAROSCOPIC APPENDECTOMY N/A 12/09/2012   Procedure: APPENDECTOMY LAPAROSCOPIC;  Surgeon: Gwenyth Ober, MD;  Location: [facility];  Service: General;  Laterality: N/A;    OB History    Gravida Para Term Preterm AB Living   4 2 1 1 1 2    SAB TAB Ectopic Multiple Live Births   0 0 1 0 2       Home Medications    Prior to Admission medications   Medication Sig Start Date End Date Taking? Authorizing Provider  acetaminophen (TYLENOL) 325 MG tablet Take 650 mg by mouth every 6 (six) hours as needed for mild pain.    Historical Provider, MD  aspirin-acetaminophen-caffeine (EXCEDRIN MIGRAINE) [phone] MG tablet Take 1 tablet by mouth every 6 (six) hours as needed for headache.    Historical Provider, MD  azithromycin (ZITHROMAX) 250 MG tablet Take first 2 tablets together the first day, then 1 every day until finished. Patient not taking: Reported on 12/20/2015 11/20/15   Olivia Canter Sam, PA-C  doxycycline (VIBRA-TABS) 100 MG tablet Take 1 tablet (100 mg total) by mouth 2 (two) times daily. Patient not taking: Reported on 12/20/2015 07/27/15   Mariel Aloe, MD  fluticasone [facility]) 50 MCG/ACT nasal spray Place 2 sprays into both nostrils daily. Patient not taking: Reported on 12/20/2015 10/08/15   [name], PA-C  ibuprofen (ADVIL,MOTRIN) 800 MG tablet Take 1 tablet (800 mg total) by  mouth every 8 (eight) hours as needed for mild pain or moderate pain. Patient not taking: Reported on 12/20/2015 10/08/15   [name], PA-C  ibuprofen (ADVIL,MOTRIN) 800 MG tablet Take 1 tablet (800 mg total) by mouth 3 (three) times daily. Patient not taking: Reported on 12/20/2015 11/20/15   Olivia Canter Sam, PA-C  metroNIDAZOLE (FLAGYL) 500 MG tablet Take 1 tablet (500 mg total) by mouth 2 (two) times daily. Patient not taking: Reported on 12/20/2015 07/27/15   Mariel Aloe, MD  omeprazole (PRILOSEC) 20 MG capsule Take 1  capsule (20 mg total) by mouth daily. 04/16/16   Francisco Manuel Espina, PA  ondansetron (ZOFRAN ODT) 4 MG disintegrating tablet Take 1 tablet (4 mg total) by mouth every 8 (eight) hours as needed for nausea or vomiting. Patient not taking: Reported on 12/20/2015 11/20/15   Olivia Canter Sam, PA-C  traMADol (ULTRAM) 50 MG tablet Take 1 tablet (50 mg total) by mouth every 6 (six) hours as needed. Patient not taking: Reported on 12/20/2015 07/27/15   Mariel Aloe, MD  traMADol (ULTRAM) 50 MG tablet Take 1 tablet (50 mg total) by mouth every 6 (six) hours as needed for severe pain. Patient not taking: Reported on 12/20/2015 11/20/15   Anne Ng, PA-C    Family History Family History  Problem Relation Age of Onset  . Asthma Mother   . Hypertension Mother   . Parkinsonism Mother   . Asthma Brother   . Diabetes Maternal Grandmother   . Hypertension Maternal Grandmother   . Stroke Maternal Grandmother   . Heart disease Maternal Grandmother   . Lupus Maternal Aunt   . Heart disease Maternal Grandfather   . Anesthesia problems Neg Hx   . Malignant hyperthermia Neg Hx   . Pseudochol deficiency Neg Hx   . Hypotension Neg Hx     Social History Social History  Substance Use Topics  . Smoking status: Current Every Day Smoker  . Smokeless tobacco: Never Used     Comment: 2014  . Alcohol use 0.0 oz/week     Comment: social     Allergies   Amoxicillin; Contrast media [iodinated diagnostic agents]; and Dilaudid [hydromorphone hcl]   Review of Systems Review of Systems  Constitutional: Negative for fever.  HENT: Negative for rhinorrhea and sore throat.   Eyes: Negative for redness.  Respiratory: Negative for cough.   Cardiovascular: Negative for chest pain.  Gastrointestinal: Positive for abdominal pain, nausea and vomiting. Negative for blood in stool and diarrhea.  Genitourinary: Negative for dysuria.  Musculoskeletal: Negative for myalgias.  Skin: Negative for rash.  Neurological:  Negative for headaches.     Physical Exam Updated Vital Signs BP 130/89 (BP Location: Right Arm)   Pulse 75   Temp 98.3 F (36.8 C) (Oral)   Resp 20   Ht 5' (1.524 m)   Wt 108.9 kg   LMP 04/18/2016   SpO2 100%   BMI 46.87 kg/m   Physical Exam  Constitutional: She appears well-developed and well-nourished.  HENT:  Head: Normocephalic and atraumatic.  Mouth/Throat: Oropharynx is clear and moist.  Eyes: Conjunctivae are normal. Right eye exhibits no discharge. Left eye exhibits no discharge.  Neck: Normal range of motion. Neck supple.  Cardiovascular: Normal rate, regular rhythm and normal heart sounds.   No murmur heard. Pulmonary/Chest: Effort normal and breath sounds normal. No respiratory distress. She has no wheezes. She has no rales.  Abdominal: Soft. There is tenderness (diffuse, minimal). There is no rebound  and no guarding.  Neurological: She is alert.  Skin: Skin is warm and dry.  Psychiatric: She has a normal mood and affect.  Nursing note and vitals reviewed.    ED Treatments / Results  Labs (all labs ordered are listed, but only abnormal results are displayed) Labs Reviewed  COMPREHENSIVE METABOLIC PANEL - Abnormal; Notable for the following:       Result Value   ALT 12 (*)    All other components within normal limits  CBC - Abnormal; Notable for the following:    Hemoglobin 11.3 (*)    HCT 35.4 (*)    All other components within normal limits  URINALYSIS, ROUTINE W REFLEX MICROSCOPIC - Abnormal; Notable for the following:    Hgb urine dipstick SMALL (*)    Squamous Epithelial / LPF 0-5 (*)    All other components within normal limits  LIPASE, BLOOD  I-STAT BETA HCG BLOOD, ED ([facility], [facility], [facility] ONLY)    Radiology Dg Abd 2 Views  Result Date: 05/15/2016 CLINICAL DATA:  Vomiting for 3 weeks EXAM: ABDOMEN - 2 VIEW COMPARISON:  None. FINDINGS: Supine and upright images obtained. There is moderate stool throughout the colon. There is no bowel dilatation or  air-fluid level suggesting bowel obstruction. No free air. There are surgical clips the right upper quadrant region. Lung bases are clear. IMPRESSION: Moderate stool throughout colon. No bowel obstruction or evident free air. Electronically Signed   By: Lowella Grip III M.D.   On: 05/15/2016 08:04    Procedures Procedures (including critical care time)  Medications Ordered in ED Medications - No data to display   Initial Impression / Assessment and Plan / ED Course  I have reviewed the triage vital signs and the nursing notes.  Pertinent labs & imaging results that were available during my care of the patient were reviewed by me and considered in my medical decision making (see chart for details).     Patient seen and examined. Work-up initiated. Medications ordered. Will recheck labs and abd x-ray. I feel that patient would benefit from GI evaluation as her symptoms are becoming chronic without acute identifiable cause.   Vital signs reviewed and are as follows: BP 130/89 (BP Location: Right Arm)   Pulse 75   Temp 98.3 F (36.8 C) (Oral)   Resp 20   Ht 5' (1.524 m)   Wt 108.9 kg   LMP 04/18/2016   SpO2 100%   BMI 46.87 kg/m   10:12 AM patient taking sips of water in the room without vomiting. Discussed that she will benefit from a gastroenterology consult. Referral given.  Will provide prescription for Phenergan, Bentyl, omeprazole.   The patient was urged to return to the Emergency Department immediately with worsening of current symptoms, worsening abdominal pain, persistent vomiting, blood noted in stools, fever, or any other concerns. The patient verbalized understanding.    Final Clinical Impressions(s) / ED Diagnoses   Final diagnoses:  Non-intractable vomiting with nausea, unspecified vomiting type  Abdominal cramping   Patient with now chronic nausea, vomiting, and diarrhea with associated abdominal cramps. The symptoms have been waxing and waning but  ongoing for over 1 month. Workup to this point have been unrevealing. At this point, feel patient would benefit from GI evaluation. Referral given. Return instructions as above. Patient appears well, nontoxic. Symptoms controlled in emergency department. Abdomen without focal tenderness.  New Prescriptions New Prescriptions   DICYCLOMINE (BENTYL) 20 MG TABLET    Take 1 tablet (20  mg total) by mouth 2 (two) times daily.   OMEPRAZOLE (PRILOSEC) 20 MG CAPSULE    Take 1 capsule (20 mg total) by mouth daily.   PROMETHAZINE (PHENERGAN) 25 MG TABLET    Take 1 tablet (25 mg total) by mouth every 6 (six) hours as needed for nausea or vomiting.     Carlisle Cater, PA-C 05/15/16 [place], MD 05/24/16 [phone]

## 2016-05-15 NOTE — Discharge Instructions (Signed)
Please read and follow all provided instructions.  Your diagnoses today include:  1. Non-intractable vomiting with nausea, unspecified vomiting type   2. Abdominal cramping     Tests performed today include:  Blood counts and electrolytes  Blood tests to check liver and kidney function  Blood tests to check pancreas function  Urine test to look for infection and pregnancy (in women)  X-ray - shows stool but no blockages  Vital signs. See below for your results today.   Medications prescribed:   Bentyl - medication for intestinal cramps and spasms   Phenergan (promethazine) - for nausea and vomiting   Omeprazole (Prilosec) - stomach acid reducer  This medication can be found over-the-counter  Take any prescribed medications only as directed.  Home care instructions:   Follow any educational materials contained in this packet.  Follow-up instructions: Please follow-up with the gastroenterologist (stomach doctor) listed for further evaluation of your symptoms.    Return instructions:  SEEK IMMEDIATE MEDICAL ATTENTION IF:  The pain does not go away or becomes severe   A temperature above 101F develops   Repeated vomiting occurs (multiple episodes)   The pain becomes localized to portions of the abdomen. The right side could possibly be appendicitis. In an adult, the left lower portion of the abdomen could be colitis or diverticulitis.   Blood is being passed in stools or vomit (bright red or black tarry stools)   You develop chest pain, difficulty breathing, dizziness or fainting, or become confused, poorly responsive, or inconsolable (young children)  If you have any other emergent concerns regarding your health  Additional Information: Abdominal (belly) pain can be caused by many things. Your caregiver performed an examination and possibly ordered blood/urine tests and imaging (CT scan, x-rays, ultrasound). Many cases can be observed and treated at home after  initial evaluation in the emergency department. Even though you are being discharged home, abdominal pain can be unpredictable. Therefore, you need a repeated exam if your pain does not resolve, returns, or worsens. Most patients with abdominal pain don't have to be admitted to the hospital or have surgery, but serious problems like appendicitis and gallbladder attacks can start out as nonspecific pain. Many abdominal conditions cannot be diagnosed in one visit, so follow-up evaluations are very important.  Your vital signs today were: BP 112/72    Pulse 74    Temp 98.3 F (36.8 C) (Oral)    Resp 16    Ht 5' (1.524 m)    Wt 108.9 kg    LMP 04/18/2016    SpO2 98%    BMI 46.87 kg/m  If your blood pressure (bp) was elevated above 135/85 this visit, please have this repeated by your doctor within one month. --------------

## 2016-05-15 NOTE — ED Triage Notes (Signed)
Pt reports abdominal pain x 1 month with n/v/diarrhea. Pt states she has been seen here 3 times for same and is requesting CT scan. PT states she had >10 episodes of vomiting yesterday. Abdominal pain is cramping in nature. NAD VSS

## 2016-07-03 ENCOUNTER — Emergency Department (HOSPITAL_COMMUNITY): Payer: Self-pay

## 2016-07-03 ENCOUNTER — Encounter (HOSPITAL_COMMUNITY): Payer: Self-pay | Admitting: Emergency Medicine

## 2016-07-03 ENCOUNTER — Emergency Department (HOSPITAL_COMMUNITY)
Admission: EM | Admit: 2016-07-03 | Discharge: 2016-07-03 | Disposition: A | Discharge status: Home / Self Care | Payer: Self-pay | Attending: Emergency Medicine | Admitting: Emergency Medicine

## 2016-07-03 DIAGNOSIS — F17200 Nicotine dependence, unspecified, uncomplicated: Secondary | ICD-10-CM | POA: Insufficient documentation

## 2016-07-03 DIAGNOSIS — R1084 Generalized abdominal pain: Secondary | ICD-10-CM | POA: Insufficient documentation

## 2016-07-03 DIAGNOSIS — Z7982 Long term (current) use of aspirin: Secondary | ICD-10-CM | POA: Insufficient documentation

## 2016-07-03 DIAGNOSIS — I10 Essential (primary) hypertension: Secondary | ICD-10-CM | POA: Insufficient documentation

## 2016-07-03 LAB — COMPREHENSIVE METABOLIC PANEL
ALT: 14 U/L (ref 14–54)
AST: 19 U/L (ref 15–41)
Albumin: 4.3 g/dL (ref 3.5–5.0)
Alkaline Phosphatase: 76 U/L (ref 38–126)
Anion gap: 8 (ref 5–15)
BUN: 8 mg/dL (ref 6–20)
CO2: 23 mmol/L (ref 22–32)
Calcium: 9.2 mg/dL (ref 8.9–10.3)
Chloride: 108 mmol/L (ref 101–111)
Creatinine, Ser: 0.63 mg/dL (ref 0.44–1.00)
GFR calc Af Amer: >60 mL/min (ref >60)
GFR calc non Af Amer: >60 mL/min (ref >60)
Glucose, Bld: 84 mg/dL (ref 65–99)
Potassium: 3.5 mmol/L (ref 3.5–5.1)
Sodium: 139 mmol/L (ref 135–145)
Total Bilirubin: 0.6 mg/dL (ref 0.3–1.2)
Total Protein: 8.1 g/dL (ref 6.5–8.1)

## 2016-07-03 LAB — URINALYSIS, ROUTINE W REFLEX MICROSCOPIC
Bacteria, UA: NONE SEEN
Bilirubin Urine: NEGATIVE
Glucose, UA: NEGATIVE mg/dL
Ketones, ur: 5 mg/dL — AB
Leukocytes, UA: NEGATIVE
Nitrite: NEGATIVE
Protein, ur: NEGATIVE mg/dL
Specific Gravity, Urine: 1.027 (ref 1.005–1.030)
pH: 5 (ref 5.0–8.0)

## 2016-07-03 LAB — CBC
HCT: 38.2 % (ref 36.0–46.0)
Hemoglobin: 12.3 g/dL (ref 12.0–15.0)
MCH: 26.5 pg (ref 26.0–34.0)
MCHC: 32.2 g/dL (ref 30.0–36.0)
MCV: 82.2 fL (ref 78.0–100.0)
Platelets: 316 10*3/uL (ref 150–400)
RBC: 4.65 MIL/uL (ref 3.87–5.11)
RDW: 15.1 % (ref 11.5–15.5)
WBC: 7.7 10*3/uL (ref 4.0–10.5)

## 2016-07-03 LAB — I-STAT BETA HCG BLOOD, ED (MC, WL, AP ONLY): I-stat hCG, quantitative: <5 m[IU]/mL (ref <5)

## 2016-07-03 LAB — LIPASE, BLOOD: Lipase: 25 U/L (ref 11–51)

## 2016-07-03 MED ORDER — ONDANSETRON 4 MG PO TBDP
4.0000 mg | ORAL_TABLET | Freq: Once | ORAL | Status: AC | PRN
  Administered 2016-07-03: 4 mg via ORAL
  Filled 2016-07-03: qty 1

## 2016-07-03 MED ORDER — PROMETHAZINE HCL 25 MG PO TABS: 25.0000 mg | ORAL_TABLET | Freq: Three times a day (TID) | ORAL | 0 refills | Status: DC | PRN

## 2016-07-03 MED ORDER — TRAMADOL HCL 50 MG PO TABS: 50.0000 mg | ORAL_TABLET | Freq: Four times a day (QID) | ORAL | 0 refills | Status: DC | PRN

## 2016-07-03 MED ORDER — SODIUM CHLORIDE 0.9 % IV BOLUS (SEPSIS)
1000.0000 mL | Freq: Once | INTRAVENOUS | Status: AC
  Administered 2016-07-03: 1000 mL via INTRAVENOUS

## 2016-07-03 NOTE — ED Triage Notes (Signed)
Patient states that she has lower to mid abd pain that radiates to back that is intermittent over the past month. Patient states that she had vomited 3 x today. States when she puts food on stomach she vomits.  Patient denies constipation or diarrhea.

## 2016-07-03 NOTE — ED Notes (Signed)
Pt in CT.

## 2016-07-03 NOTE — ED Provider Notes (Signed)
Emma Chambers Provider Note   CSN: [id number] Arrival date & time: 07/03/16  1252     History   Chief Complaint Chief Complaint  Patient presents with  . Abdominal Pain  . Back Pain  . Vomiting    HPI [name] is a 26 y.o. female.  HPI Patient presents to the emergency department with last abdominal pain over the last 1-2 months.  The patient states that is been seen for this previously and given medications, not seem to help.  Patient states that she did vomit 3 times today.  She states that it is mainly after eating.  Patient states nothing seems make her condition betterThe patient denies chest pain, shortness of breath, headache,blurred vision, neck pain, fever, cough, weakness, numbness, dizziness, anorexia, edema,  diarrhea, rash, back pain, dysuria, hematemesis, bloody stool, near syncope, or syncope. Past Medical History:  Diagnosis Date  . Anemia   . Gallstones   . Headache(784.0)    chronic  . Hypertension    GHTN with last pg  . Kidney stones   . Ovarian cyst   . Pregnancy induced hypertension   . UTI (lower urinary tract infection)     Patient Active Problem List   Diagnosis Date Noted  . Ruptured ectopic pregnancy 12/27/2014  . 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 Generalized headaches 03/27/2011  . H. pylori infection 02/13/2011    Past Surgical History:  Procedure Laterality Date  . APPENDECTOMY    . CHOLECYSTECTOMY N/A 07/27/2012   Procedure: LAPAROSCOPIC CHOLECYSTECTOMY WITH INTRAOPERATIVE CHOLANGIOGRAM;  Surgeon: Madilyn Hook, DO;  Location: [facility];  Service: General;  Laterality: N/A;  . DIAGNOSTIC LAPAROSCOPY WITH REMOVAL OF ECTOPIC PREGNANCY N/A 12/27/2014   Procedure: DIAGNOSTIC LAPAROSCOPY, Right Salpingectomy with removal ECTOPIC PREGNANCY;  Surgeon: [name], MD;  Location: [facility];  Service: Gynecology;  Laterality: N/A;  . LAPAROSCOPIC APPENDECTOMY N/A 12/09/2012   Procedure: APPENDECTOMY LAPAROSCOPIC;  Surgeon: Gwenyth Ober,  MD;  Location: [facility];  Service: General;  Laterality: N/A;    OB History    Gravida Para Term Preterm AB Living   4 2 1 1 1 2    SAB TAB Ectopic Multiple Live Births   0 0 1 0 2       Home Medications    Prior to Admission medications   Medication Sig Start Date End Date Taking? Authorizing Provider  dicyclomine (BENTYL) 20 MG tablet Take 1 tablet (20 mg total) by mouth 2 (two) times daily. 05/15/16  Yes Carlisle Cater, PA-C  omeprazole (PRILOSEC) 20 MG capsule Take 1 capsule (20 mg total) by mouth daily. 05/15/16  Yes Carlisle Cater, PA-C  aspirin-acetaminophen-caffeine (EXCEDRIN MIGRAINE) [phone] MG tablet Take 1 tablet by mouth every 6 (six) hours as needed for headache.    Historical Provider, MD  promethazine (PHENERGAN) 25 MG tablet Take 1 tablet (25 mg total) by mouth every 8 (eight) hours as needed for nausea or vomiting. 07/03/16   Dalia Heading, PA-C  traMADol (ULTRAM) 50 MG tablet Take 1 tablet (50 mg total) by mouth every 6 (six) hours as needed for severe pain. 07/03/16   Dalia Heading, PA-C    Family History Family History  Problem Relation Age of Onset  . Asthma Mother   . Hypertension Mother   . Parkinsonism Mother   . Asthma Brother   . Diabetes Maternal Grandmother   . Hypertension Maternal Grandmother   . Stroke Maternal Grandmother   . Heart disease Maternal Grandmother   . Lupus  Maternal Aunt   . Heart disease Maternal Grandfather   . Anesthesia problems Neg Hx   . Malignant hyperthermia Neg Hx   . Pseudochol deficiency Neg Hx   . Hypotension Neg Hx     Social History Social History  Substance Use Topics  . Smoking status: Current Every Day Smoker  . Smokeless tobacco: Never Used     Comment: 2014  . Alcohol use 0.0 oz/week     Comment: social     Allergies   Amoxicillin; Contrast media [iodinated diagnostic agents]; and Dilaudid [hydromorphone hcl]   Review of Systems Review of Systems All other systems negative except as  documented in the HPI. All pertinent positives and negatives as reviewed in the HPI.   Physical Exam Updated Vital Signs BP 106/65 (BP Location: Left Arm)   Pulse 90   Temp 98.7 F (37.1 C) (Oral)   Resp 18   Ht 5\' 2"  (1.575 m)   Wt 108.9 kg   LMP 05/20/2016   SpO2 100%   BMI 43.90 kg/m   Physical Exam  Constitutional: She is oriented to person, place, and time. She appears well-developed and well-nourished. No distress.  HENT:  Head: Normocephalic and atraumatic.  Mouth/Throat: Oropharynx is clear and moist.  Eyes: Pupils are equal, round, and reactive to light.  Neck: Normal range of motion. Neck supple.  Cardiovascular: Normal rate, regular rhythm and normal heart sounds.  Exam reveals no gallop and no friction rub.   No murmur heard. Pulmonary/Chest: Effort normal and breath sounds normal. No respiratory distress. She has no wheezes.  Abdominal: Soft. Bowel sounds are normal. She exhibits no distension and no mass. There is tenderness. There is no guarding.  Neurological: She is alert and oriented to person, place, and time. She exhibits normal muscle tone. Coordination normal.  Skin: Skin is warm and dry. Capillary refill takes less than 2 seconds. No rash noted. No erythema.  Psychiatric: She has a normal mood and affect. Her behavior is normal.  Nursing note and vitals reviewed.    ED Treatments / Results  Labs (all labs ordered are listed, but only abnormal results are displayed) Labs Reviewed  URINALYSIS, ROUTINE W REFLEX MICROSCOPIC - Abnormal; Notable for the following:       Result Value   APPearance HAZY (*)    Hgb urine dipstick SMALL (*)    Ketones, ur 5 (*)    Squamous Epithelial / LPF 6-30 (*)    All other components within normal limits  LIPASE, BLOOD  COMPREHENSIVE METABOLIC PANEL  CBC  I-STAT BETA HCG BLOOD, ED ([facility], [facility], [facility] ONLY)    EKG  EKG Interpretation None       Radiology Ct Abdomen Pelvis Wo Contrast  Result Date:  07/03/2016 CLINICAL DATA:  Chronic mid to lower abdominal pain, radiating to the back. Vomiting. Initial encounter. EXAM: CT ABDOMEN AND PELVIS WITHOUT CONTRAST TECHNIQUE: Multidetector CT imaging of the abdomen and pelvis was performed following the standard protocol without IV contrast. COMPARISON:  CT of the abdomen and pelvis performed 10/07/2014, and pelvic ultrasound performed 01/08/2016 FINDINGS: Lower chest: The visualized lung bases are grossly clear. The visualized portions of the mediastinum are unremarkable. Hepatobiliary: The liver is unremarkable in appearance. The patient is status post cholecystectomy, with clips noted at the gallbladder fossa. The common bile duct remains normal in caliber. Pancreas: The pancreas is within normal limits. Spleen: The spleen is unremarkable in appearance. Adrenals/Urinary Tract: The adrenal glands are unremarkable in appearance. The kidneys  are within normal limits. There is no evidence of hydronephrosis. No renal or ureteral stones are identified. No perinephric stranding is seen. Stomach/Bowel: The stomach is unremarkable in appearance. The small bowel is within normal limits. The patient is status post appendectomy. The colon is unremarkable in appearance. Vascular/Lymphatic: The abdominal aorta is unremarkable in appearance. The inferior vena cava is grossly unremarkable. No retroperitoneal lymphadenopathy is seen. No pelvic sidewall lymphadenopathy is identified. Reproductive: The bladder is mildly distended and within normal limits. The uterus is grossly unremarkable in appearance. The ovaries are relatively symmetric. No suspicious adnexal masses are seen. Other: No additional soft tissue abnormalities are seen. Musculoskeletal: No acute osseous abnormalities are identified. The visualized musculature is unremarkable in appearance. IMPRESSION: Unremarkable noncontrast CT of the abdomen and pelvis. Electronically Signed   By: Garald Balding M.D.   On: 07/03/2016  19:11    Procedures Procedures (including critical care time)  Medications Ordered in ED Medications  ondansetron (ZOFRAN-ODT) disintegrating tablet 4 mg (4 mg Oral Given 07/03/16 1313)  sodium chloride 0.9 % bolus 1,000 mL (0 mLs Intravenous Stopped 07/03/16 1745)     Initial Impression / Assessment and Plan / ED Course  I have reviewed the triage vital signs and the nursing notes.  Pertinent labs & imaging results that were available during my care of the patient were reviewed by me and considered in my medical decision making (see chart for details).   patient has negative CT scan was negative.  Patient will be referred to GI for further evaluation and follow-up.  Patient agrees the plan and all questions were answered this point.  Her abdominal pain is undifferentiated.  I did tell her to return here for any worsening in her condition    Final Clinical Impressions(s) / ED Diagnoses   Final diagnoses:  Generalized abdominal pain    New Prescriptions New Prescriptions   PROMETHAZINE (PHENERGAN) 25 MG TABLET    Take 1 tablet (25 mg total) by mouth every 8 (eight) hours as needed for nausea or vomiting.   TRAMADOL (ULTRAM) 50 MG TABLET    Take 1 tablet (50 mg total) by mouth every 6 (six) hours as needed for severe pain.     Dalia Heading, PA-C 07/05/16 [id number]    Daleen Bo, MD 07/05/16 [phone]

## 2016-07-03 NOTE — ED Notes (Signed)
Provider at bedside

## 2016-07-03 NOTE — Discharge Instructions (Signed)
Return here as needed. Follow up with the GI doctor provided. Your CT scan was normal. Your blood testing did not show any significant abnormalities

## 2016-07-19 ENCOUNTER — Inpatient Hospital Stay (HOSPITAL_COMMUNITY)
Admission: AD | Admit: 2016-07-19 | Discharge: 2016-07-19 | Disposition: A | Discharge status: Home / Self Care | Payer: Self-pay | Source: Ambulatory Visit | Attending: Obstetrics and Gynecology | Admitting: Obstetrics and Gynecology

## 2016-07-19 ENCOUNTER — Encounter (HOSPITAL_COMMUNITY): Payer: Self-pay | Admitting: Student

## 2016-07-19 DIAGNOSIS — I10 Essential (primary) hypertension: Secondary | ICD-10-CM | POA: Insufficient documentation

## 2016-07-19 DIAGNOSIS — F17200 Nicotine dependence, unspecified, uncomplicated: Secondary | ICD-10-CM | POA: Insufficient documentation

## 2016-07-19 DIAGNOSIS — R1013 Epigastric pain: Secondary | ICD-10-CM | POA: Insufficient documentation

## 2016-07-19 DIAGNOSIS — N938 Other specified abnormal uterine and vaginal bleeding: Secondary | ICD-10-CM | POA: Insufficient documentation

## 2016-07-19 DIAGNOSIS — Z79899 Other long term (current) drug therapy: Secondary | ICD-10-CM | POA: Insufficient documentation

## 2016-07-19 DIAGNOSIS — Z880 Allergy status to penicillin: Secondary | ICD-10-CM | POA: Insufficient documentation

## 2016-07-19 DIAGNOSIS — Z3202 Encounter for pregnancy test, result negative: Secondary | ICD-10-CM | POA: Insufficient documentation

## 2016-07-19 DIAGNOSIS — R112 Nausea with vomiting, unspecified: Secondary | ICD-10-CM | POA: Insufficient documentation

## 2016-07-19 LAB — URINALYSIS, ROUTINE W REFLEX MICROSCOPIC
Bacteria, UA: NONE SEEN
Bilirubin Urine: NEGATIVE
Glucose, UA: NEGATIVE mg/dL
Ketones, ur: NEGATIVE mg/dL
Leukocytes, UA: NEGATIVE
Nitrite: NEGATIVE
Protein, ur: NEGATIVE mg/dL
Specific Gravity, Urine: 1.031 — ABNORMAL HIGH (ref 1.005–1.030)
pH: 5 (ref 5.0–8.0)

## 2016-07-19 LAB — COMPREHENSIVE METABOLIC PANEL
ALT: 11 U/L — ABNORMAL LOW (ref 14–54)
AST: 17 U/L (ref 15–41)
Albumin: 4.1 g/dL (ref 3.5–5.0)
Alkaline Phosphatase: 68 U/L (ref 38–126)
Anion gap: 7 (ref 5–15)
BUN: 10 mg/dL (ref 6–20)
CO2: 24 mmol/L (ref 22–32)
Calcium: 8.8 mg/dL — ABNORMAL LOW (ref 8.9–10.3)
Chloride: 103 mmol/L (ref 101–111)
Creatinine, Ser: 0.5 mg/dL (ref 0.44–1.00)
GFR calc Af Amer: >60 mL/min (ref >60)
GFR calc non Af Amer: >60 mL/min (ref >60)
Glucose, Bld: 73 mg/dL (ref 65–99)
Potassium: 3.4 mmol/L — ABNORMAL LOW (ref 3.5–5.1)
Sodium: 134 mmol/L — ABNORMAL LOW (ref 135–145)
Total Bilirubin: 0.4 mg/dL (ref 0.3–1.2)
Total Protein: 7.6 g/dL (ref 6.5–8.1)

## 2016-07-19 LAB — CBC
HCT: 34.9 % — ABNORMAL LOW (ref 36.0–46.0)
Hemoglobin: 11.5 g/dL — ABNORMAL LOW (ref 12.0–15.0)
MCH: 26.7 pg (ref 26.0–34.0)
MCHC: 33 g/dL (ref 30.0–36.0)
MCV: 81 fL (ref 78.0–100.0)
Platelets: 352 K/uL (ref 150–400)
RBC: 4.31 MIL/uL (ref 3.87–5.11)
RDW: 15.1 % (ref 11.5–15.5)
WBC: 9.2 K/uL (ref 4.0–10.5)

## 2016-07-19 LAB — POCT PREGNANCY, URINE: Preg Test, Ur: NEGATIVE

## 2016-07-19 LAB — LIPASE, BLOOD: Lipase: 25 U/L (ref 11–51)

## 2016-07-19 MED ORDER — PANTOPRAZOLE SODIUM 40 MG PO TBEC: 40.0000 mg | DELAYED_RELEASE_TABLET | Freq: Every day | ORAL | 0 refills | Status: DC

## 2016-07-19 MED ORDER — MEDROXYPROGESTERONE ACETATE 10 MG PO TABS: 10.0000 mg | ORAL_TABLET | Freq: Every day | ORAL | 0 refills | Status: DC

## 2016-07-19 MED ORDER — GI COCKTAIL ~~LOC~~: 30.0000 mL | Freq: Once | ORAL | Status: DC

## 2016-07-19 MED ORDER — ONDANSETRON 8 MG PO TBDP
8.0000 mg | ORAL_TABLET | Freq: Once | ORAL | Status: AC
  Administered 2016-07-19: 8 mg via ORAL
  Filled 2016-07-19: qty 1

## 2016-07-19 MED ORDER — GI COCKTAIL ~~LOC~~
30.0000 mL | Freq: Once | ORAL | Status: AC
  Administered 2016-07-19: 30 mL via ORAL
  Filled 2016-07-19: qty 30

## 2016-07-19 NOTE — Discharge Instructions (Signed)
Abnormal Uterine Bleeding Abnormal uterine bleeding can affect women at various stages in life, including teenagers, women in their reproductive years, pregnant women, and women who have reached menopause. Several kinds of uterine bleeding are considered abnormal, including:  Bleeding or spotting between periods.  Bleeding after sexual intercourse.  Bleeding that is heavier or more than normal.  Periods that last longer than usual.  Bleeding after menopause. Many cases of abnormal uterine bleeding are minor and simple to treat, while others are more serious. Any type of abnormal bleeding should be evaluated by your health care provider. Treatment will depend on the cause of the bleeding. Follow these instructions at home: Monitor your condition for any changes. The following actions may help to alleviate any discomfort you are experiencing:  Avoid the use of tampons and douches as directed by your health care provider.  Change your pads frequently. You should get regular pelvic exams and Pap tests. Keep all follow-up appointments for diagnostic tests as directed by your health care provider. Contact a health care provider if:  Your bleeding lasts more than 1 week.  You feel dizzy at times. Get help right away if:  You pass out.  You are changing pads every 15 to 30 minutes.  You have abdominal pain.  You have a fever.  You become sweaty or weak.  You are passing large blood clots from the vagina.  You start to feel nauseous and vomit. This information is not intended to replace advice given to you by your health care provider. Make sure you discuss any questions you have with your health care provider. Document Released: 03/04/2005 Document Revised: 08/16/2015 Document Reviewed: 10/01/2012 Elsevier Interactive Patient Education  2017 [employer].   Abdominal Pain, Adult Abdominal pain can be caused by many things. Often, abdominal pain is not serious and it gets better  with no treatment or by being treated at home. However, sometimes abdominal pain is serious. Your health care provider will do a medical history and a physical exam to try to determine the cause of your abdominal pain. Follow these instructions at home:  Take over-the-counter and prescription medicines only as told by your health care provider. Do not take a laxative unless told by your health care provider.  Drink enough fluid to keep your urine clear or pale yellow.  Watch your condition for any changes.  Keep all follow-up visits as told by your health care provider. This is important. Contact a health care provider if:  Your abdominal pain changes or gets worse.  You are not hungry or you lose weight without trying.  You are constipated or have diarrhea for more than 2-3 days.  You have pain when you urinate or have a bowel movement.  Your abdominal pain wakes you up at night.  Your pain gets worse with meals, after eating, or with certain foods.  You are throwing up and cannot keep anything down.  You have a fever. Get help right away if:  Your pain does not go away as soon as your health care provider told you to expect.  You cannot stop throwing up.  Your pain is only in areas of the abdomen, such as the right side or the left lower portion of the abdomen.  You have bloody or black stools, or stools that look like tar.  You have severe pain, cramping, or bloating in your abdomen.  You have signs of dehydration, such as:  Dark urine, very little urine, or no urine.  Cracked lips.  Dry mouth.  Sunken eyes.  Sleepiness.  Weakness. This information is not intended to replace advice given to you by your health care provider. Make sure you discuss any questions you have with your health care provider. Document Released: 12/12/2004 Document Revised: 09/22/2015 Document Reviewed: 08/16/2015 Elsevier Interactive Patient Education  2017 [employer].

## 2016-07-19 NOTE — MAU Note (Signed)
Pt presents to MAU with complaints of starting her menstrual cycle on February the 13th and has been bleeding since that day. States some days the bleeding is heavy and then it lightens up for a couple days. Has tried midol and ibuprofen. Lower abdominal cramping and passing large clots.

## 2016-07-19 NOTE — MAU Provider Note (Signed)
History     CSN: [id number]  Arrival date and time: 07/19/16 1238   First Provider Initiated Contact with Patient 07/19/16 1357      Chief Complaint  Patient presents with  . Vaginal Bleeding  . Abdominal Pain   HPI Emma Chambers is a 26 y.o. non pregnant female who presents with abdominal pain & vaginal bleeding.  Patient reports upper abdominal pain for the last 2 months. Has been seen in ED for pain previously & had negative CT scan last month. Was referred to gastroenterology & states she has an appointment next month with them. States pain is constant & getting no better. Describes as throbbing & sore pain. Rates pain 10/10. Nothing makes better or worse. Has been treating with ibuprofen, midol, advil, & dietary changes without relief. Associated symptom includes nausea. Pt has had gall bladder removed.  Vaginal bleeding since beginning of February. States bleeding alternates in flow from spotting to heavy bleeding with clots. Occasional lower abdominal cramping. Denies any history of AUB. Prior to February had monthly menses that lasted 3 days at a time.    Past Medical History:  Diagnosis Date  . Anemia   . Gallstones   . Headache(784.0)    chronic  . Hypertension    GHTN with last pg  . Kidney stones   . Ovarian cyst   . Pregnancy induced hypertension   . UTI (lower urinary tract infection)     Past Surgical History:  Procedure Laterality Date  . CHOLECYSTECTOMY N/A 07/27/2012   Procedure: LAPAROSCOPIC CHOLECYSTECTOMY WITH INTRAOPERATIVE CHOLANGIOGRAM;  Surgeon: Madilyn Hook, DO;  Location: [facility];  Service: General;  Laterality: N/A;  . DIAGNOSTIC LAPAROSCOPY WITH REMOVAL OF ECTOPIC PREGNANCY N/A 12/27/2014   Procedure: DIAGNOSTIC LAPAROSCOPY, Right Salpingectomy with removal ECTOPIC PREGNANCY;  Surgeon: [name], MD;  Location: [facility];  Service: Gynecology;  Laterality: N/A;  . LAPAROSCOPIC APPENDECTOMY N/A 12/09/2012   Procedure: APPENDECTOMY LAPAROSCOPIC;   Surgeon: Gwenyth Ober, MD;  Location: [facility];  Service: General;  Laterality: N/A;    Family History  Problem Relation Age of Onset  . Asthma Mother   . Hypertension Mother   . Parkinsonism Mother   . Asthma Brother   . Diabetes Maternal Grandmother   . Hypertension Maternal Grandmother   . Stroke Maternal Grandmother   . Heart disease Maternal Grandmother   . Lupus Maternal Aunt   . Heart disease Maternal Grandfather   . Anesthesia problems Neg Hx   . Malignant hyperthermia Neg Hx   . Pseudochol deficiency Neg Hx   . Hypotension Neg Hx     Social History  Substance Use Topics  . Smoking status: Current Every Day Smoker  . Smokeless tobacco: Never Used     Comment: 2014  . Alcohol use 0.0 oz/week     Comment: social    Allergies:  Allergies  Allergen Reactions  . Amoxicillin Nausea And Vomiting and Other (See Comments)    Tics & twitches Has patient had a PCN reaction causing immediate rash, facial/tongue/throat swelling, SOB or lightheadedness with hypotension: Yes Has patient had a PCN reaction causing severe rash involving mucus membranes or skin necrosis: No Has patient had a PCN reaction that required hospitalization No Has patient had a PCN reaction occurring within the last 10 years: Yes If all of the above answers are "NO", then may proceed with Cephalosporin use.  . Contrast Media [Iodinated Diagnostic Agents] Hives  . Dilaudid [Hydromorphone Hcl] Itching  Prescriptions Prior to Admission  Medication Sig Dispense Refill Last Dose  . ibuprofen (ADVIL,MOTRIN) 200 MG tablet Take 400 mg by mouth every 6 (six) hours as needed for mild pain or cramping.   07/19/2016 at Unknown time  . naproxen sodium (ANAPROX) 220 MG tablet Take 440 mg by mouth 2 (two) times daily as needed (pain).    07/19/2016 at Unknown time  . dicyclomine (BENTYL) 20 MG tablet Take 1 tablet (20 mg total) by mouth 2 (two) times daily. 20 tablet 0 07/03/2016 at Unknown time  . omeprazole (PRILOSEC)  20 MG capsule Take 1 capsule (20 mg total) by mouth daily. 30 capsule 0 07/03/2016 at Unknown time  . promethazine (PHENERGAN) 25 MG tablet Take 1 tablet (25 mg total) by mouth every 8 (eight) hours as needed for nausea or vomiting. 15 tablet 0   . traMADol (ULTRAM) 50 MG tablet Take 1 tablet (50 mg total) by mouth every 6 (six) hours as needed for severe pain. 15 tablet 0     Review of Systems  Constitutional: Negative.   Respiratory: Negative.   Cardiovascular: Negative.   Gastrointestinal: Positive for abdominal pain, nausea and vomiting. Negative for abdominal distention, constipation and diarrhea.  Genitourinary: Positive for vaginal bleeding. Negative for dyspareunia, dysuria and pelvic pain.  Musculoskeletal: Negative for back pain.   Physical Exam   Blood pressure 125/77, pulse 90, temperature 98.6 F (37 C), resp. rate 18, weight 240 lb (108.9 kg), last menstrual period 05/01/2016, unknown if currently breastfeeding.  Physical Exam  Nursing note and vitals reviewed. Constitutional: She is oriented to person, place, and time. She appears well-developed and well-nourished. No distress.  HENT:  Head: Normocephalic and atraumatic.  Eyes: Conjunctivae are normal. Right eye exhibits no discharge. Left eye exhibits no discharge. No scleral icterus.  Neck: Normal range of motion.  Cardiovascular: Normal rate, regular rhythm and normal heart sounds.   No murmur heard. Respiratory: Effort normal and breath sounds normal. No respiratory distress. She has no wheezes.  GI: Soft. Bowel sounds are normal. There is tenderness in the epigastric area. There is no rigidity, no rebound and no guarding.  Neurological: She is alert and oriented to person, place, and time.  Skin: Skin is warm and dry. She is not diaphoretic.  Psychiatric: She has a normal mood and affect. Her behavior is normal. Judgment and thought content normal.    MAU Course  Procedures Results for orders placed or performed  during the hospital encounter of 07/19/16 (from the past 24 hour(s))  Urinalysis, Routine w reflex microscopic     Status: Abnormal   Collection Time: 07/19/16 12:57 PM  Result Value Ref Range   Color, Urine YELLOW YELLOW   APPearance CLEAR CLEAR   Specific Gravity, Urine 1.031 (H) 1.005 - 1.030   pH 5.0 5.0 - 8.0   Glucose, UA NEGATIVE NEGATIVE mg/dL   Hgb urine dipstick MODERATE (A) NEGATIVE   Bilirubin Urine NEGATIVE NEGATIVE   Ketones, ur NEGATIVE NEGATIVE mg/dL   Protein, ur NEGATIVE NEGATIVE mg/dL   Nitrite NEGATIVE NEGATIVE   Leukocytes, UA NEGATIVE NEGATIVE   RBC / HPF 6-30 0 - 5 RBC/hpf   WBC, UA 0-5 0 - 5 WBC/hpf   Bacteria, UA NONE SEEN NONE SEEN   Squamous Epithelial / LPF 0-5 (A) NONE SEEN   Mucous PRESENT   Pregnancy, urine POC     Status: None   Collection Time: 07/19/16  1:03 PM  Result Value Ref Range   Preg Test,  Ur NEGATIVE NEGATIVE  CBC     Status: Abnormal   Collection Time: 07/19/16  2:36 PM  Result Value Ref Range   WBC 9.2 4.0 - 10.5 K/uL   RBC 4.31 3.87 - 5.11 MIL/uL   Hemoglobin 11.5 (L) 12.0 - 15.0 g/dL   HCT 34.9 (L) 36.0 - 46.0 %   MCV 81.0 78.0 - 100.0 fL   MCH 26.7 26.0 - 34.0 pg   MCHC 33.0 30.0 - 36.0 g/dL   RDW 15.1 11.5 - 15.5 %   Platelets 352 150 - 400 K/uL  Lipase, blood     Status: None   Collection Time: 07/19/16  2:36 PM  Result Value Ref Range   Lipase 25 11 - 51 U/L  Comprehensive metabolic panel     Status: Abnormal   Collection Time: 07/19/16  2:36 PM  Result Value Ref Range   Sodium 134 (L) 135 - 145 mmol/L   Potassium 3.4 (L) 3.5 - 5.1 mmol/L   Chloride 103 101 - 111 mmol/L   CO2 24 22 - 32 mmol/L   Glucose, Bld 73 65 - 99 mg/dL   BUN 10 6 - 20 mg/dL   Creatinine, Ser 0.50 0.44 - 1.00 mg/dL   Calcium 8.8 (L) 8.9 - 10.3 mg/dL   Total Protein 7.6 6.5 - 8.1 g/dL   Albumin 4.1 3.5 - 5.0 g/dL   AST 17 15 - 41 U/L   ALT 11 (L) 14 - 54 U/L   Alkaline Phosphatase 68 38 - 126 U/L   Total Bilirubin 0.4 0.3 - 1.2 mg/dL   GFR  calc non Af Amer >60 >60 mL/min   GFR calc Af Amer >60 >60 mL/min   Anion gap 7 5 - 15    MDM UPT negative Declines pelvic d/t abdominal discomfort VSS GI cocktail -- pt vomited within minutes of taking Zofran  CBC, CMP, lipase Pt had negative CT of abdomen/pelvis on 4/18  Assessment and Plan  A: 1. Epigastric pain   2. Nausea and vomiting, intractability of vomiting not specified, unspecified vomiting type   3. Dysfunctional uterine bleeding   4. Pregnancy examination or test, negative result    P; Discharge home D/c NSAIDs until follow up with GI Rx protonix & provera x 10 days Keep f/u with GI -- if symptoms worsen go to ED Msg to [facility] for f/u r/t [place] 07/19/2016, 3:19 PM

## 2016-07-22 ENCOUNTER — Encounter: Payer: Self-pay | Admitting: Obstetrics & Gynecology

## 2016-08-15 ENCOUNTER — Other Ambulatory Visit: Payer: Self-pay | Admitting: Student

## 2016-08-19 ENCOUNTER — Encounter: Payer: Self-pay | Admitting: General Practice

## 2016-08-19 ENCOUNTER — Encounter: Payer: Self-pay | Admitting: Obstetrics & Gynecology

## 2016-08-19 NOTE — Progress Notes (Signed)
Patient no showed for office visit. Can reschedule on her own per Dr Ihor Dow

## 2016-11-07 ENCOUNTER — Encounter (HOSPITAL_COMMUNITY): Payer: Self-pay

## 2016-11-07 ENCOUNTER — Emergency Department (HOSPITAL_COMMUNITY)
Admission: EM | Admit: 2016-11-07 | Discharge: 2016-11-07 | Disposition: A | Discharge status: Home / Self Care | Payer: Self-pay | Attending: Emergency Medicine | Admitting: Emergency Medicine

## 2016-11-07 DIAGNOSIS — Z79899 Other long term (current) drug therapy: Secondary | ICD-10-CM | POA: Insufficient documentation

## 2016-11-07 DIAGNOSIS — F17200 Nicotine dependence, unspecified, uncomplicated: Secondary | ICD-10-CM | POA: Insufficient documentation

## 2016-11-07 DIAGNOSIS — Z9049 Acquired absence of other specified parts of digestive tract: Secondary | ICD-10-CM | POA: Insufficient documentation

## 2016-11-07 DIAGNOSIS — I10 Essential (primary) hypertension: Secondary | ICD-10-CM | POA: Insufficient documentation

## 2016-11-07 DIAGNOSIS — J069 Acute upper respiratory infection, unspecified: Secondary | ICD-10-CM | POA: Insufficient documentation

## 2016-11-07 DIAGNOSIS — R112 Nausea with vomiting, unspecified: Secondary | ICD-10-CM | POA: Insufficient documentation

## 2016-11-07 LAB — RAPID STREP SCREEN (MED CTR MEBANE ONLY): Streptococcus, Group A Screen (Direct): NEGATIVE

## 2016-11-07 MED ORDER — FLUTICASONE PROPIONATE 50 MCG/ACT NA SUSP: 1.0000 | Freq: Every day | NASAL | 2 refills | Status: DC

## 2016-11-07 MED ORDER — PANTOPRAZOLE SODIUM 40 MG PO TBEC: 40.0000 mg | DELAYED_RELEASE_TABLET | Freq: Every day | ORAL | 0 refills | Status: DC

## 2016-11-07 NOTE — ED Notes (Signed)
Pt verbalized understanding of discharge instructions and denies any further questions at this time.   

## 2016-11-07 NOTE — ED Triage Notes (Signed)
Pt presents for evaluation of URI symptoms x 2-3 days. Denies sick contacts or recent travel. Pt Reports cough, runny nose, congestion, sore throat. Denies fever.

## 2016-11-07 NOTE — Discharge Instructions (Signed)
Please read attached information. If you experience any new or worsening signs or symptoms please return to the emergency room for evaluation. Please follow-up with your primary care provider or specialist as discussed. Please use medication prescribed only as directed and discontinue taking if you have any concerning signs or symptoms.   °

## 2016-11-07 NOTE — ED Provider Notes (Signed)
[facility] DEPT Provider Note   CSN: [id number] Arrival date & time: 11/07/16  1212     History   Chief Complaint Chief Complaint  Patient presents with  . URI  . Generalized Body Aches    HPI Emma Chambers is a 26 y.o. female.  HPI   26 year old female presents today with complaints of upper respiratory infection.  Patient reports 2-3 days of rhinorrhea, nonproductive cough and nasal congestion.  Patient denies any fevers at home, denies any chest pain.  Patient reports she chronically has abdominal pain is having episode now.  She has notes associated with vomiting and diarrhea.  She notes that she was given Protonix which significantly improved her symptoms but she ran out of the medication.  Patient has not been taking anything for abdominal pain, has not followed up with outpatient gastroenterology as previously instructed.      Past Medical History:  Diagnosis Date  . Anemia   . Gallstones   . Headache(784.0)    chronic  . Hypertension    GHTN with last pg  . Kidney stones   . Ovarian cyst   . Pregnancy induced hypertension   . UTI (lower urinary tract infection)     Patient Active Problem List   Diagnosis Date Noted  . Ruptured ectopic pregnancy 12/27/2014  . 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 Generalized headaches 03/27/2011  . H. pylori infection 02/13/2011    Past Surgical History:  Procedure Laterality Date  . CHOLECYSTECTOMY N/A 07/27/2012   Procedure: LAPAROSCOPIC CHOLECYSTECTOMY WITH INTRAOPERATIVE CHOLANGIOGRAM;  Surgeon: Madilyn Hook, DO;  Location: [facility];  Service: General;  Laterality: N/A;  . DIAGNOSTIC LAPAROSCOPY WITH REMOVAL OF ECTOPIC PREGNANCY N/A 12/27/2014   Procedure: DIAGNOSTIC LAPAROSCOPY, Right Salpingectomy with removal ECTOPIC PREGNANCY;  Surgeon: [name], MD;  Location: [facility];  Service: Gynecology;  Laterality: N/A;  . LAPAROSCOPIC APPENDECTOMY N/A 12/09/2012   Procedure: APPENDECTOMY LAPAROSCOPIC;  Surgeon: Gwenyth Ober, MD;  Location: [facility];  Service: General;  Laterality: N/A;    OB History    Gravida Para Term Preterm AB Living   3 2 1 1 1 2    SAB TAB Ectopic Multiple Live Births   0 0 1 0 2       Home Medications    Prior to Admission medications   Medication Sig Start Date End Date Taking? Authorizing Provider  fluticasone (FLONASE) 50 MCG/ACT nasal spray Place 1 spray into both nostrils daily. 11/07/16   Taquan Bralley, Dellis Filbert, PA-C  medroxyPROGESTERone (PROVERA) 10 MG tablet Take 1 tablet (10 mg total) by mouth daily. 07/19/16   Jorje Guild, NP  pantoprazole (PROTONIX) 40 MG tablet Take 1 tablet (40 mg total) by mouth daily. 11/07/16   Damyan Corne, Dellis Filbert, PA-C  promethazine (PHENERGAN) 25 MG tablet Take 1 tablet (25 mg total) by mouth every 8 (eight) hours as needed for nausea or vomiting. 07/03/16   Lawyer, Harrell Gave, PA-C  traMADol (ULTRAM) 50 MG tablet Take 1 tablet (50 mg total) by mouth every 6 (six) hours as needed for severe pain. 07/03/16   Dalia Heading, PA-C    Family History Family History  Problem Relation Age of Onset  . Asthma Mother   . Hypertension Mother   . Parkinsonism Mother   . Asthma Brother   . Diabetes Maternal Grandmother   . Hypertension Maternal Grandmother   . Stroke Maternal Grandmother   . Heart disease Maternal Grandmother   . Lupus Maternal Aunt   . Heart disease Maternal  Grandfather   . Anesthesia problems Neg Hx   . Malignant hyperthermia Neg Hx   . Pseudochol deficiency Neg Hx   . Hypotension Neg Hx     Social History Social History  Substance Use Topics  . Smoking status: Current Every Day Smoker  . Smokeless tobacco: Never Used     Comment: 2014  . Alcohol use 0.0 oz/week     Comment: social     Allergies   Amoxicillin; Contrast media [iodinated diagnostic agents]; and Dilaudid [hydromorphone hcl]   Review of Systems Review of Systems  All other systems reviewed and are negative.    Physical Exam Updated Vital Signs BP  128/87 (BP Location: Right Arm)   Pulse 79   Temp 98.6 F (37 C) (Oral)   Resp 17   Ht 5\' 2"  (1.575 m)   Wt 104.3 kg (230 lb)   LMP 11/06/2016 (Exact Date)   SpO2 98%   BMI 42.07 kg/m   Physical Exam  Constitutional: She is oriented to person, place, and time. She appears well-developed and well-nourished.  HENT:  Head: Normocephalic and atraumatic.  Rhinorrhea  Eyes: Pupils are equal, round, and reactive to light. Conjunctivae are normal. Right eye exhibits no discharge. Left eye exhibits no discharge. No scleral icterus.  Neck: Normal range of motion. No JVD present. No tracheal deviation present.  Cardiovascular: Normal rate and regular rhythm.  Exam reveals no gallop and no friction rub.   No murmur heard. Pulmonary/Chest: Effort normal and breath sounds normal. No stridor. No respiratory distress. She has no wheezes. She has no rales. She exhibits no tenderness.  Abdominal: Soft. She exhibits no distension and no mass. There is no tenderness. There is no rebound and no guarding.  Neurological: She is alert and oriented to person, place, and time. Coordination normal.  Psychiatric: She has a normal mood and affect. Her behavior is normal. Judgment and thought content normal.  Nursing note and vitals reviewed.    ED Treatments / Results  Labs (all labs ordered are listed, but only abnormal results are displayed) Labs Reviewed  RAPID STREP SCREEN (NOT AT [facility])  CULTURE, GROUP A STREP [facility])    EKG  EKG Interpretation None       Radiology No results found.  Procedures Procedures (including critical care time)  Medications Ordered in ED Medications - No data to display   Initial Impression / Assessment and Plan / ED Course  I have reviewed the triage vital signs and the nursing notes.  Pertinent labs & imaging results that were available during my care of the patient were reviewed by me and considered in my medical decision making (see chart for details).       Patient with viral URI no signs of bacterial infection symptomatic care instructions given.  Patient also having abdominal pain similar to previous.  Patient has had significant workups in the past without significant findings.  This is similar, she has a soft nontender abdomen, no need for further evaluation in this clinical setting.  Patient given strict return precautions, she verbalized understanding and agreement to today's plan.  Final Clinical Impressions(s) / ED Diagnoses   Final diagnoses:  Viral URI  Nausea and vomiting, intractability of vomiting not specified, unspecified vomiting type    New Prescriptions Discharge Medication List as of 11/07/2016  4:51 PM    START taking these medications   Details  fluticasone (FLONASE) 50 MCG/ACT nasal spray Place 1 spray into both nostrils daily., Starting Thu 11/07/2016,  Print         Francee Gentile 11/07/16 2148    Davonna Belling, MD 11/09/16 Adelfa Koh

## 2016-11-08 LAB — CULTURE, GROUP A STREP (THRC)

## 2016-11-09 ENCOUNTER — Telehealth: Payer: Self-pay

## 2016-11-09 NOTE — Telephone Encounter (Signed)
Post ED Visit - Positive Culture Follow-up: Unsuccessful Patient Follow-up  Culture assessed and recommendations reviewed by:  []  Elenor Quinones, Pharm.D. []  Heide Guile, Pharm.D., BCPS AQ-ID [x]  Parks Neptune, Pharm.D., BCPS []  Alycia Rossetti, Pharm.D., BCPS []  [place], Pharm.D., BCPS, AAHIVP []  Legrand Como, Pharm.D., BCPS, AAHIVP []  Salome Arnt, PharmD, BCPS []  Dimitri Ped, PharmD, BCPS []  Vincenza Hews, PharmD, BCPS  Positive strep culture  [x]  Patient discharged without antimicrobial prescription and treatment is now indicated []  Organism is resistant to prescribed ED discharge antimicrobial []  Patient with positive blood cultures   Unable to contact patient after 3 attempts, letter will be sent to address on file  Genia Del 11/09/2016, 10:54 AM

## 2016-11-09 NOTE — Progress Notes (Signed)
ED Antimicrobial Stewardship Positive Culture Follow Up   Emma Chambers is an 26 y.o. female who presented to [facility] on 11/07/2016 with a chief complaint of  Chief Complaint  Patient presents with  . URI  . Generalized Body Aches    Recent Results (from the past 720 hour(s))  Rapid strep screen     Status: None   Collection Time: 11/07/16 12:30 PM  Result Value Ref Range Status   Streptococcus, Group A Screen (Direct) NEGATIVE NEGATIVE Final    Comment: DELTA CHECK NOTED (NOTE) A Rapid Antigen test may result negative if the antigen level in the sample is below the detection level of this test. The FDA has not cleared this test as a stand-alone test therefore the rapid antigen negative result has reflexed to a Group A Strep culture.   Culture, group A strep     Status: None   Collection Time: 11/07/16 12:30 PM  Result Value Ref Range Status   Specimen Description THROAT  Final   Special Requests NONE Reflexed from [id number]  Final   Culture FEW GROUP A STREP (S.PYOGENES) ISOLATED  Final   Report Status 11/08/2016 FINAL  Final   [x]  Patient discharged originally without antimicrobial agent and treatment is now indicated  New antibiotic prescription: [email or web address] as directed  ED Provider: Joline Maxcy, PA-C  Wynell Balloon 11/09/2016, 9:41 AM Infectious Diseases Pharmacist Phone# [phone]

## 2016-12-04 ENCOUNTER — Telehealth: Payer: Self-pay | Admitting: *Deleted

## 2016-12-04 NOTE — Telephone Encounter (Signed)
Letter sent to address on file is returned with no forwarding address.  No further treatment provided.

## 2016-12-15 ENCOUNTER — Emergency Department (HOSPITAL_COMMUNITY)
Admission: EM | Admit: 2016-12-15 | Discharge: 2016-12-15 | Disposition: A | Discharge status: Home / Self Care | Payer: Medicaid Other | Attending: Emergency Medicine | Admitting: Emergency Medicine

## 2016-12-15 ENCOUNTER — Encounter (HOSPITAL_COMMUNITY): Payer: Self-pay | Admitting: Emergency Medicine

## 2016-12-15 DIAGNOSIS — Z79899 Other long term (current) drug therapy: Secondary | ICD-10-CM | POA: Diagnosis not present

## 2016-12-15 DIAGNOSIS — I10 Essential (primary) hypertension: Secondary | ICD-10-CM | POA: Diagnosis not present

## 2016-12-15 DIAGNOSIS — R1013 Epigastric pain: Secondary | ICD-10-CM | POA: Diagnosis not present

## 2016-12-15 DIAGNOSIS — R112 Nausea with vomiting, unspecified: Secondary | ICD-10-CM | POA: Diagnosis present

## 2016-12-15 DIAGNOSIS — F17200 Nicotine dependence, unspecified, uncomplicated: Secondary | ICD-10-CM | POA: Diagnosis not present

## 2016-12-15 LAB — TYPE AND SCREEN
ABO/RH(D): A POS
Antibody Screen: NEGATIVE

## 2016-12-15 LAB — CBC
HCT: 34.9 % — ABNORMAL LOW (ref 36.0–46.0)
Hemoglobin: 11.3 g/dL — ABNORMAL LOW (ref 12.0–15.0)
MCH: 26.4 pg (ref 26.0–34.0)
MCHC: 32.4 g/dL (ref 30.0–36.0)
MCV: 81.5 fL (ref 78.0–100.0)
Platelets: 332 10*3/uL (ref 150–400)
RBC: 4.28 MIL/uL (ref 3.87–5.11)
RDW: 14.9 % (ref 11.5–15.5)
WBC: 8.6 10*3/uL (ref 4.0–10.5)

## 2016-12-15 LAB — COMPREHENSIVE METABOLIC PANEL
ALT: 12 U/L — ABNORMAL LOW (ref 14–54)
AST: 18 U/L (ref 15–41)
Albumin: 4.2 g/dL (ref 3.5–5.0)
Alkaline Phosphatase: 67 U/L (ref 38–126)
Anion gap: 7 (ref 5–15)
BUN: 10 mg/dL (ref 6–20)
CO2: 23 mmol/L (ref 22–32)
Calcium: 8.9 mg/dL (ref 8.9–10.3)
Chloride: 106 mmol/L (ref 101–111)
Creatinine, Ser: 0.61 mg/dL (ref 0.44–1.00)
GFR calc Af Amer: >60 mL/min (ref >60)
GFR calc non Af Amer: >60 mL/min (ref >60)
Glucose, Bld: 84 mg/dL (ref 65–99)
Potassium: 3.3 mmol/L — ABNORMAL LOW (ref 3.5–5.1)
Sodium: 136 mmol/L (ref 135–145)
Total Bilirubin: 0.4 mg/dL (ref 0.3–1.2)
Total Protein: 7.2 g/dL (ref 6.5–8.1)

## 2016-12-15 LAB — ABO/RH: ABO/RH(D): A POS

## 2016-12-15 LAB — POC OCCULT BLOOD, ED: Fecal Occult Bld: NEGATIVE

## 2016-12-15 MED ORDER — GI COCKTAIL ~~LOC~~
30.0000 mL | Freq: Once | ORAL | Status: AC
  Administered 2016-12-15: 30 mL via ORAL
  Filled 2016-12-15: qty 30

## 2016-12-15 MED ORDER — ONDANSETRON 4 MG PO TBDP
4.0000 mg | ORAL_TABLET | Freq: Once | ORAL | Status: AC
  Administered 2016-12-15: 4 mg via ORAL
  Filled 2016-12-15: qty 1

## 2016-12-15 MED ORDER — ONDANSETRON HCL 4 MG/2ML IJ SOLN: 4.0000 mg | Freq: Once | INTRAMUSCULAR | Status: DC

## 2016-12-15 MED ORDER — SODIUM CHLORIDE 0.9 % IV BOLUS (SEPSIS): 1000.0000 mL | Freq: Once | INTRAVENOUS | Status: DC

## 2016-12-15 MED ORDER — OXYCODONE-ACETAMINOPHEN 5-325 MG PO TABS
1.0000 | ORAL_TABLET | Freq: Once | ORAL | Status: AC
  Administered 2016-12-15: 1 via ORAL
  Filled 2016-12-15: qty 1

## 2016-12-15 MED ORDER — ONDANSETRON 4 MG PO TBDP: ORAL_TABLET | ORAL | 0 refills | Status: DC

## 2016-12-15 MED ORDER — PANTOPRAZOLE SODIUM 20 MG PO TBEC: 40.0000 mg | DELAYED_RELEASE_TABLET | Freq: Two times a day (BID) | ORAL | 0 refills | Status: DC

## 2016-12-15 MED ORDER — MORPHINE SULFATE (PF) 4 MG/ML IV SOLN: 4.0000 mg | Freq: Once | INTRAVENOUS | Status: DC

## 2016-12-15 NOTE — ED Provider Notes (Signed)
[facility] DEPT Provider Note   CSN: [id number] Arrival date & time: 12/15/16  0950     History   Chief Complaint Chief Complaint  Patient presents with  . Rectal Bleeding  . Emesis  . Nausea  . Abdominal Pain    HPI  Emma Chambers is a 26 y.o. Female with a history of GERD, and previous appendectomy, and cholecystectomy, who presents with dark stools, N/V and epigastric abdominal pain. Pt describes epigastric abdominal pain as sharp burning pain that is intermittent and worse with meals, pt reports this pain has been present off and on for the past month intermittently but she has been unable to get in to see the GI doc. Pt reports associated nausea and vomiting, denies any grossly bloody emesis or coffee grounds emesis. Pt reports 2 days of dark brown to black stools, no blood in stools. Pt reports she has been having worsening reflux symptoms despite taking PPI. Pt denies CP, SOB, or lightheadedness.       Past Medical History:  Diagnosis Date  . Anemia   . Gallstones   . Headache(784.0)    chronic  . Hypertension    GHTN with last pg  . Kidney stones   . Ovarian cyst   . Pregnancy induced hypertension   . UTI (lower urinary tract infection)     Patient Active Problem List   Diagnosis Date Noted  . Ruptured ectopic pregnancy 12/27/2014  . 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 Generalized headaches 03/27/2011  . H. pylori infection 02/13/2011    Past Surgical History:  Procedure Laterality Date  . CHOLECYSTECTOMY N/A 07/27/2012   Procedure: LAPAROSCOPIC CHOLECYSTECTOMY WITH INTRAOPERATIVE CHOLANGIOGRAM;  Surgeon: Madilyn Hook, DO;  Location: [facility];  Service: General;  Laterality: N/A;  . DIAGNOSTIC LAPAROSCOPY WITH REMOVAL OF ECTOPIC PREGNANCY N/A 12/27/2014   Procedure: DIAGNOSTIC LAPAROSCOPY, Right Salpingectomy with removal ECTOPIC PREGNANCY;  Surgeon: [name], MD;  Location: [facility];  Service: Gynecology;  Laterality: N/A;  . LAPAROSCOPIC APPENDECTOMY  N/A 12/09/2012   Procedure: APPENDECTOMY LAPAROSCOPIC;  Surgeon: Gwenyth Ober, MD;  Location: [facility];  Service: General;  Laterality: N/A;    OB History    Gravida Para Term Preterm AB Living   3 2 1 1 1 2    SAB TAB Ectopic Multiple Live Births   0 0 1 0 2       Home Medications    Prior to Admission medications   Medication Sig Start Date End Date Taking? Authorizing Provider  fluticasone (FLONASE) 50 MCG/ACT nasal spray Place 1 spray into both nostrils daily. 11/07/16   Hedges, Dellis Filbert, PA-C  medroxyPROGESTERone (PROVERA) 10 MG tablet Take 1 tablet (10 mg total) by mouth daily. 07/19/16   Jorje Guild, NP  pantoprazole (PROTONIX) 40 MG tablet Take 1 tablet (40 mg total) by mouth daily. 11/07/16   Hedges, Dellis Filbert, PA-C  promethazine (PHENERGAN) 25 MG tablet Take 1 tablet (25 mg total) by mouth every 8 (eight) hours as needed for nausea or vomiting. 07/03/16   Lawyer, Harrell Gave, PA-C  traMADol (ULTRAM) 50 MG tablet Take 1 tablet (50 mg total) by mouth every 6 (six) hours as needed for severe pain. 07/03/16   Dalia Heading, PA-C    Family History Family History  Problem Relation Age of Onset  . Asthma Mother   . Hypertension Mother   . Parkinsonism Mother   . Asthma Brother   . Diabetes Maternal Grandmother   . Hypertension Maternal Grandmother   . Stroke  Maternal Grandmother   . Heart disease Maternal Grandmother   . Lupus Maternal Aunt   . Heart disease Maternal Grandfather   . Anesthesia problems Neg Hx   . Malignant hyperthermia Neg Hx   . Pseudochol deficiency Neg Hx   . Hypotension Neg Hx     Social History Social History  Substance Use Topics  . Smoking status: Current Every Day Smoker  . Smokeless tobacco: Never Used     Comment: 2014  . Alcohol use 0.0 oz/week     Comment: social     Allergies   Amoxicillin; Contrast media [iodinated diagnostic agents]; and Dilaudid [hydromorphone hcl]   Review of Systems Review of Systems  Constitutional:  Negative for chills and fatigue.  HENT: Negative for congestion, rhinorrhea (Epigastric) and sore throat.   Eyes: Negative for visual disturbance.  Respiratory: Negative for choking, chest tightness and shortness of breath.   Cardiovascular: Negative for chest pain, palpitations and leg swelling.  Gastrointestinal: Positive for abdominal pain, nausea and vomiting. Negative for constipation and diarrhea.       Melenic stools  Genitourinary: Negative for dysuria.  Musculoskeletal: Negative for arthralgias and myalgias.  Skin: Negative for pallor and rash.  Neurological: Negative for dizziness, weakness and light-headedness.     Physical Exam Updated Vital Signs BP 120/70 (BP Location: Left Arm)   Pulse 93   Temp 98.4 F (36.9 C) (Oral)   Resp 16   Ht 5\' 1"  (1.549 m)   Wt 104.3 kg (230 lb)   LMP 12/04/2016   SpO2 100%   BMI 43.46 kg/m   Physical Exam  Constitutional: She is oriented to person, place, and time. She appears well-developed and well-nourished. No distress.  HENT:  Head: Normocephalic and atraumatic.  Mouth/Throat: Oropharynx is clear and moist.  Eyes: Right eye exhibits no discharge. Left eye exhibits no discharge.  Cardiovascular: Normal rate, regular rhythm, normal heart sounds and intact distal pulses.   Pulmonary/Chest: Effort normal and breath sounds normal. No respiratory distress. She has no wheezes. She has no rales.  Abdominal: Soft. Bowel sounds are normal.  Epigastric tenderness with some guarding, all other quadrants nontender to palpation, no rebound tenderness, no CVA tenderness  Musculoskeletal: She exhibits no edema or deformity.  Neurological: She is alert and oriented to person, place, and time. Coordination normal.  Speech is clear, able to follow commands CN III-XII intact Normal strength in upper and lower extremities bilaterally including dorsiflexion and plantar flexion, strong and equal grip strength Moves extremities without ataxia,  coordination intact  Skin: Skin is warm and dry. Capillary refill takes less than 2 seconds. She is not diaphoretic. No pallor.  Psychiatric: She has a normal mood and affect. Her behavior is normal.  Nursing note and vitals reviewed.    ED Treatments / Results  Labs (all labs ordered are listed, but only abnormal results are displayed) Labs Reviewed  COMPREHENSIVE METABOLIC PANEL - Abnormal; Notable for the following:       Result Value   Potassium 3.3 (*)    ALT 12 (*)    All other components within normal limits  CBC - Abnormal; Notable for the following:    Hemoglobin 11.3 (*)    HCT 34.9 (*)    All other components within normal limits  POC OCCULT BLOOD, ED  TYPE AND SCREEN  ABO/RH    EKG  EKG Interpretation None       Radiology No results found.  Procedures Procedures (including critical care time)  Medications Ordered in ED Medications  oxyCODONE-acetaminophen (PERCOCET/ROXICET) 5-325 MG per tablet 1 tablet (1 tablet Oral Given 12/15/16 1336)  ondansetron (ZOFRAN-ODT) disintegrating tablet 4 mg (4 mg Oral Given 12/15/16 1336)  gi cocktail (Maalox,Lidocaine,Donnatal) (30 mLs Oral Given 12/15/16 1346)     Initial Impression / Assessment and Plan / ED Course  I have reviewed the triage vital signs and the nursing notes.  Pertinent labs & imaging results that were available during my care of the patient were reviewed by me and considered in my medical decision making (see chart for details).  Patient presents with 2 days of dark stools, in the setting of epigastric abdominal pain nausea and vomiting. Patient is well appearing, vitals normal. Hemoglobin 11.5, stable from 4 months ago, Hemoccult negative. Patient has slightly low potassium, labs otherwise unremarkable, encourage patient to eat potassium-rich foods such as bananas. Abdominal pain, nausea and vomiting treated with PO meds, this patient requested not to get an IV unless absolutely necessary. On re-eval  patient able to tolerate by mouth fluids, pain relieved. Patient is stable for discharge home, follow up with GI doctor and PCP. Return precautions provided. Patient expresses understanding and agrees with plan.  Final Clinical Impressions(s) / ED Diagnoses   Final diagnoses:  Non-intractable vomiting with nausea, unspecified vomiting type  Epigastric pain    New Prescriptions Discharge Medication List as of 12/15/2016  2:25 PM    START taking these medications   Details  ondansetron (ZOFRAN ODT) 4 MG disintegrating tablet 4mg  ODT q4 hours prn nausea/vomit, Print    !! pantoprazole (PROTONIX) 20 MG tablet Take 2 tablets (40 mg total) by mouth 2 (two) times daily., Starting Sun 12/15/2016, Until Tue 01/14/2017, Print     !! - Potential duplicate medications found. Please discuss with provider.       Jacqlyn Larsen, PA-C 12/15/16 1621    Merrily Pew, MD 12/16/16 [phone]

## 2016-12-15 NOTE — ED Notes (Signed)
ED Provider at bedside. 

## 2016-12-15 NOTE — Discharge Instructions (Signed)
There was no evidence of blood in your stools today and your hemoglobin is stable. If you have worsening abdominal pain, develop black or bloody stools, you're unable to keep down fluids or other new or concerning symptoms develop. Schedule appointment with GI doctor as we discussed.

## 2016-12-15 NOTE — ED Triage Notes (Signed)
Pt. Stated, I started having black stool yesterday. Ive had stomach pain with N/V, but not the black stool.

## 2016-12-27 ENCOUNTER — Encounter (HOSPITAL_COMMUNITY): Payer: Self-pay | Admitting: Family Medicine

## 2016-12-27 ENCOUNTER — Ambulatory Visit (HOSPITAL_COMMUNITY)
Admission: EM | Admit: 2016-12-27 | Discharge: 2016-12-27 | Disposition: A | Discharge status: Home / Self Care | Payer: Medicaid Other | Attending: Family Medicine | Admitting: Family Medicine

## 2016-12-27 DIAGNOSIS — Z3202 Encounter for pregnancy test, result negative: Secondary | ICD-10-CM | POA: Diagnosis not present

## 2016-12-27 DIAGNOSIS — R1032 Left lower quadrant pain: Secondary | ICD-10-CM | POA: Diagnosis not present

## 2016-12-27 LAB — POCT I-STAT, CHEM 8
BUN: 8 mg/dL (ref 6–20)
Calcium, Ion: 1.18 mmol/L (ref 1.15–1.40)
Chloride: 103 mmol/L (ref 101–111)
Creatinine, Ser: 0.6 mg/dL (ref 0.44–1.00)
Glucose, Bld: 85 mg/dL (ref 65–99)
HCT: 41 % (ref 36.0–46.0)
Hemoglobin: 13.9 g/dL (ref 12.0–15.0)
Potassium: 3.6 mmol/L (ref 3.5–5.1)
Sodium: 140 mmol/L (ref 135–145)
TCO2: 25 mmol/L (ref 22–32)

## 2016-12-27 LAB — POCT URINALYSIS DIP (DEVICE)
Bilirubin Urine: NEGATIVE
Glucose, UA: NEGATIVE mg/dL
Ketones, ur: 15 mg/dL — AB
Leukocytes, UA: NEGATIVE
Nitrite: NEGATIVE
Protein, ur: NEGATIVE mg/dL
Specific Gravity, Urine: >=1.03 (ref 1.005–1.030)
Urobilinogen, UA: 0.2 mg/dL (ref 0.0–1.0)
pH: 6.5 (ref 5.0–8.0)

## 2016-12-27 LAB — POCT PREGNANCY, URINE: Preg Test, Ur: NEGATIVE

## 2016-12-27 MED ORDER — HYOSCYAMINE SULFATE SL 0.125 MG SL SUBL: 0.1250 mg | SUBLINGUAL_TABLET | Freq: Four times a day (QID) | SUBLINGUAL | 0 refills | Status: DC | PRN

## 2016-12-27 MED ORDER — PROMETHAZINE HCL 25 MG PO TABS: 25.0000 mg | ORAL_TABLET | Freq: Every evening | ORAL | 0 refills | Status: DC | PRN

## 2016-12-27 NOTE — ED Triage Notes (Signed)
Pt said she has been having abdominal pain for 3 days, was seen at the ED for the same issues back on 9/30 and was given pantoprazole and said the pain is getting worse and medication is not helping. Said she is having black tar stools still, abdominal pain, loss of appetite. Did report nausea and vomitting has gotten better since yesterday. Did make an appt with a GI doctor but it's not until 10/15 but said she needs something to help her until she can make it to that appointment.

## 2016-12-27 NOTE — ED Provider Notes (Signed)
[facility]   [id number] 12/27/16 Arrival Time: 32   SUBJECTIVE:  Emma Chambers is a 26 y.o. female who presents to the urgent care with complaint of abdominal pain in the context of multiple evaluations of abdominal pain since 2011.  These evaluations resulted in cholecystectomy, appendectomy, and right salpingo-oophorectomy for ectopic pregnancy.  She has been diagnosed with H.pylori in the past as well.  Pt said she has been having abdominal pain for 3 days, was seen at the ED for the same issues back on 9/30 and was given pantoprazole and said the pain is getting worse and medication is not helping. Said she is having black tar stools still, abdominal pain, loss of appetite. Did report nausea and vomitting has gotten better since yesterday. Did make an appt with a GI doctor but it's not until 10/15 but said she needs something to help her until she can make it to that appointment.   She was last evaluated for abdominal pain 2 weeks ago.  She was given a PPI but pain and black stools have not decreased.  LMP started 14 days ago.  Pain is crampy in mid abdomen and LLQ.  No vomiting since yesterday.  She often wakes up at 4 am with nausea and cannot get back to sleep.  She no longer has pica.     Past Medical History:  Diagnosis Date  . Anemia   . Gallstones   . Headache(784.0)    chronic  . Hypertension    GHTN with last pg  . Kidney stones   . Ovarian cyst   . Pregnancy induced hypertension   . UTI (lower urinary tract infection)    Family History  Problem Relation Age of Onset  . Asthma Mother   . Hypertension Mother   . Parkinsonism Mother   . Asthma Brother   . Diabetes Maternal Grandmother   . Hypertension Maternal Grandmother   . Stroke Maternal Grandmother   . Heart disease Maternal Grandmother   . Lupus Maternal Aunt   . Heart disease Maternal Grandfather   . Anesthesia problems Neg Hx   . Malignant hyperthermia Neg Hx   . Pseudochol  deficiency Neg Hx   . Hypotension Neg Hx    Social History   Social History  . Marital status: Single    Spouse name: N/A  . Number of children: N/A  . Years of education: N/A   Occupational History  . Not on file.   Social History Main Topics  . Smoking status: Current Every Day Smoker  . Smokeless tobacco: Never Used     Comment: 2014  . Alcohol use 0.0 oz/week     Comment: social  . Drug use: No  . Sexual activity: Yes     Comment: Sep 18th 2016   Other Topics Concern  . Not on file   Social History Narrative  . No narrative on file   No outpatient prescriptions have been marked as taking for the 12/27/16 encounter [facility] Encounter).   Allergies  Allergen Reactions  . Amoxicillin Nausea And Vomiting and Other (See Comments)    Tics & twitches Has patient had a PCN reaction causing immediate rash, facial/tongue/throat swelling, SOB or lightheadedness with hypotension: Yes Has patient had a PCN reaction causing severe rash involving mucus membranes or skin necrosis: No Has patient had a PCN reaction that required hospitalization No Has patient had a PCN reaction occurring within the last 10 years: Yes If all of the above  answers are "NO", then may proceed with Cephalosporin use.  . Contrast Media [Iodinated Diagnostic Agents] Hives  . Dilaudid [Hydromorphone Hcl] Itching      ROS: As per HPI, remainder of ROS negative.   OBJECTIVE:   Vitals:   12/27/16 1540  BP: 131/80  Pulse: 85  Resp: 18  Temp: 98.4 F (36.9 C)  TempSrc: Oral  SpO2: 98%     General appearance: alert; no distress Eyes: PERRL; EOMI; conjunctiva normal HENT: normocephalic; atraumatic; T external ears normal without trauma; nasal mucosa normal; oral mucosa normal Neck: supple Lungs: clear to auscultation bilaterally Heart: regular rate and rhythm Abdomen: soft, mildly tender mid-abdomen and LLQ with deep palpation; bowel sounds normal; no masses or organomegaly; no guarding or  rebound tenderness Back: no CVA tenderness Extremities: no cyanosis or edema; symmetrical with no gross deformities Skin: warm and dry Neurologic: normal gait; grossly normal Psychological: alert and cooperative; normal mood and affect    Labs:  Results for orders placed or performed during the hospital encounter of 12/27/16  POCT urinalysis dip (device)  Result Value Ref Range   Glucose, UA NEGATIVE NEGATIVE mg/dL   Bilirubin Urine NEGATIVE NEGATIVE   Ketones, ur 15 (A) NEGATIVE mg/dL   Specific Gravity, Urine >=1.030 1.005 - 1.030   Hgb urine dipstick TRACE (A) NEGATIVE   pH 6.5 5.0 - 8.0   Protein, ur NEGATIVE NEGATIVE mg/dL   Urobilinogen, UA 0.2 0.0 - 1.0 mg/dL   Nitrite NEGATIVE NEGATIVE   Leukocytes, UA NEGATIVE NEGATIVE  Pregnancy, urine POC  Result Value Ref Range   Preg Test, Ur NEGATIVE NEGATIVE  I-STAT, chem 8  Result Value Ref Range   Sodium 140 135 - 145 mmol/L   Potassium 3.6 3.5 - 5.1 mmol/L   Chloride 103 101 - 111 mmol/L   BUN 8 6 - 20 mg/dL   Creatinine, Ser 0.60 0.44 - 1.00 mg/dL   Glucose, Bld 85 65 - 99 mg/dL   Calcium, Ion 1.18 1.15 - 1.40 mmol/L   TCO2 25 22 - 32 mmol/L   Hemoglobin 13.9 12.0 - 15.0 g/dL   HCT 41.0 36.0 - 46.0 %    Labs Reviewed  POCT URINALYSIS DIP (DEVICE) - Abnormal; Notable for the following:       Result Value   Ketones, ur 15 (*)    Hgb urine dipstick TRACE (*)    All other components within normal limits  POCT PREGNANCY, URINE  POCT I-STAT, CHEM 8    No results found.     ASSESSMENT & PLAN:  1. Left lower quadrant pain     Meds ordered this encounter  Medications  . Hyoscyamine Sulfate SL (LEVSIN/SL) 0.125 MG SUBL    Sig: Place 0.125 mg under the tongue every 6 (six) hours as needed.    Dispense:  120 each    Refill:  0  . promethazine (PHENERGAN) 25 MG tablet    Sig: Take 1 tablet (25 mg total) by mouth at bedtime as needed and may repeat dose one time if needed for nausea or vomiting.    Dispense:   15 tablet    Refill:  0    Reviewed expectations re: course of current medical issues. Questions answered. Outlined signs and symptoms indicating need for more acute intervention. Patient verbalized understanding. After Visit Summary given.    Procedures:      Robyn Haber, MD 12/27/16 1622

## 2017-02-05 ENCOUNTER — Other Ambulatory Visit: Payer: Self-pay | Admitting: Gastroenterology

## 2017-02-05 ENCOUNTER — Telehealth: Payer: Self-pay | Admitting: Radiology

## 2017-02-05 DIAGNOSIS — R1033 Periumbilical pain: Secondary | ICD-10-CM

## 2017-02-05 NOTE — Telephone Encounter (Signed)
Called 13 hour prep to [facility]. Called pt and she will call back if she has any questions.

## 2017-02-10 ENCOUNTER — Telehealth: Payer: Self-pay | Admitting: Emergency Medicine

## 2017-02-10 NOTE — Telephone Encounter (Signed)
Lost to followup 

## 2017-02-13 ENCOUNTER — Telehealth: Payer: Self-pay

## 2017-02-13 NOTE — Progress Notes (Signed)
Called patient to let her know I called in her 13 hour prep again to her [facility] in [facility] for her CT scan tomorrow.  Brita Romp, RN

## 2017-02-14 ENCOUNTER — Other Ambulatory Visit: Payer: Medicaid Other

## 2017-03-15 ENCOUNTER — Encounter (HOSPITAL_COMMUNITY): Payer: Self-pay

## 2017-03-15 ENCOUNTER — Emergency Department (HOSPITAL_COMMUNITY)
Admission: EM | Admit: 2017-03-15 | Discharge: 2017-03-15 | Discharge status: Against Medical Advice | Payer: Medicaid Other | Attending: Physician Assistant | Admitting: Physician Assistant

## 2017-03-15 DIAGNOSIS — R195 Other fecal abnormalities: Secondary | ICD-10-CM | POA: Diagnosis not present

## 2017-03-15 DIAGNOSIS — Z79899 Other long term (current) drug therapy: Secondary | ICD-10-CM | POA: Diagnosis not present

## 2017-03-15 DIAGNOSIS — F17200 Nicotine dependence, unspecified, uncomplicated: Secondary | ICD-10-CM | POA: Insufficient documentation

## 2017-03-15 DIAGNOSIS — K2970 Gastritis, unspecified, without bleeding: Secondary | ICD-10-CM | POA: Insufficient documentation

## 2017-03-15 DIAGNOSIS — R1013 Epigastric pain: Secondary | ICD-10-CM | POA: Diagnosis present

## 2017-03-15 DIAGNOSIS — I10 Essential (primary) hypertension: Secondary | ICD-10-CM | POA: Insufficient documentation

## 2017-03-15 DIAGNOSIS — R112 Nausea with vomiting, unspecified: Secondary | ICD-10-CM | POA: Insufficient documentation

## 2017-03-15 LAB — COMPREHENSIVE METABOLIC PANEL
ALT: 10 U/L — ABNORMAL LOW (ref 14–54)
AST: 18 U/L (ref 15–41)
Albumin: 3.9 g/dL (ref 3.5–5.0)
Alkaline Phosphatase: 77 U/L (ref 38–126)
Anion gap: 7 (ref 5–15)
BUN: 7 mg/dL (ref 6–20)
CO2: 24 mmol/L (ref 22–32)
Calcium: 8.9 mg/dL (ref 8.9–10.3)
Chloride: 104 mmol/L (ref 101–111)
Creatinine, Ser: 0.67 mg/dL (ref 0.44–1.00)
GFR calc Af Amer: >60 mL/min (ref >60)
GFR calc non Af Amer: >60 mL/min (ref >60)
Glucose, Bld: 88 mg/dL (ref 65–99)
Potassium: 3.6 mmol/L (ref 3.5–5.1)
Sodium: 135 mmol/L (ref 135–145)
Total Bilirubin: 0.3 mg/dL (ref 0.3–1.2)
Total Protein: 7.5 g/dL (ref 6.5–8.1)

## 2017-03-15 LAB — URINALYSIS, ROUTINE W REFLEX MICROSCOPIC
Bilirubin Urine: NEGATIVE
Glucose, UA: NEGATIVE mg/dL
Hgb urine dipstick: NEGATIVE
Ketones, ur: NEGATIVE mg/dL
Nitrite: NEGATIVE
Protein, ur: NEGATIVE mg/dL
Specific Gravity, Urine: 1.019 (ref 1.005–1.030)
pH: 6 (ref 5.0–8.0)

## 2017-03-15 LAB — CBC
HCT: 36.6 % (ref 36.0–46.0)
Hemoglobin: 11.6 g/dL — ABNORMAL LOW (ref 12.0–15.0)
MCH: 25.7 pg — ABNORMAL LOW (ref 26.0–34.0)
MCHC: 31.7 g/dL (ref 30.0–36.0)
MCV: 81.2 fL (ref 78.0–100.0)
Platelets: 331 10*3/uL (ref 150–400)
RBC: 4.51 MIL/uL (ref 3.87–5.11)
RDW: 15.4 % (ref 11.5–15.5)
WBC: 6.8 10*3/uL (ref 4.0–10.5)

## 2017-03-15 LAB — I-STAT BETA HCG BLOOD, ED (MC, WL, AP ONLY): I-stat hCG, quantitative: <5 m[IU]/mL (ref <5)

## 2017-03-15 LAB — TYPE AND SCREEN
ABO/RH(D): A POS
Antibody Screen: NEGATIVE

## 2017-03-15 LAB — LIPASE, BLOOD: Lipase: 30 U/L (ref 11–51)

## 2017-03-15 LAB — POC OCCULT BLOOD, ED: Fecal Occult Bld: POSITIVE — AB

## 2017-03-15 MED ORDER — PROMETHAZINE HCL 25 MG/ML IJ SOLN
25.0000 mg | Freq: Once | INTRAMUSCULAR | Status: AC
  Administered 2017-03-15: 25 mg via INTRAVENOUS
  Filled 2017-03-15: qty 1

## 2017-03-15 MED ORDER — GI COCKTAIL ~~LOC~~
30.0000 mL | Freq: Once | ORAL | Status: AC
Start: 2017-03-15 — End: 2017-03-15
  Administered 2017-03-15: 30 mL via ORAL
  Filled 2017-03-15: qty 30

## 2017-03-15 MED ORDER — SODIUM CHLORIDE 0.9 % IV BOLUS (SEPSIS)
1000.0000 mL | Freq: Once | INTRAVENOUS | Status: AC
  Administered 2017-03-15: 1000 mL via INTRAVENOUS

## 2017-03-15 MED ORDER — ONDANSETRON 4 MG PO TBDP
4.0000 mg | ORAL_TABLET | Freq: Once | ORAL | Status: AC | PRN
  Administered 2017-03-15: 4 mg via ORAL
  Filled 2017-03-15: qty 1

## 2017-03-15 MED ORDER — FAMOTIDINE IN NACL 20-0.9 MG/50ML-% IV SOLN
20.0000 mg | Freq: Once | INTRAVENOUS | Status: AC
  Administered 2017-03-15: 20 mg via INTRAVENOUS
  Filled 2017-03-15: qty 50

## 2017-03-15 NOTE — ED Notes (Signed)
This RN went to check on patient and evaluate pain and patient was no where to be found.  No belongings were at bedside. IV visualized removed and on the bed.  PA notified.

## 2017-03-15 NOTE — ED Provider Notes (Cosign Needed)
[facility] EMERGENCY DEPARTMENT Provider Note   CSN: [id number] Arrival date & time: 03/15/17  1014     History   Chief Complaint Chief Complaint  Patient presents with  . Abdominal Pain    HPI Emma Chambers is a 26 y.o. female with a PMHx of anemia, gestational HTN, ovarian cysts, nephrolithiasis, and PSHx of cholecystectomy, appendectomy, and R sided oophorectomy, who presents to the ED with complaints of recurrent epigastric pain, nausea, vomiting, and melanotic stools x 2 days.  Patient has been seen in the ER multiple times for similar complaints, and has seen a GI specialist (?[facility]) and had an endoscopy and colonoscopy which reportedly were "normal".  She was placed on H. pylori medications 1 month ago which she finished, and she has been placed on multiple different medications by the GI specialist but she does not recall what they are.  She states that she does not feel like she is getting any better.  Her symptoms began again 2 days ago.  She describes her pain as 10/10 constant waxing and waning burning nonradiating epigastric abdominal pain, which worsens with eating, and has been unrelieved with the medications that she takes prescribed by the GI specialist.  She has not taken anything else over-the-counter.  She reports too numerous to count nonbloody nonbilious episodes of emesis.  She is also reported dark tarry stools over the last few days, which she has had before.  Her PCP is at [facility].  She does not take iron supplementation nor does she take Pepto-Bismol at home.  She denies fevers, chills, CP, SOB, diarrhea/constipation, obstipation, hematochezia, hematemesis, hematuria, dysuria, vaginal bleeding/discharge, myalgias, arthralgias, numbness, tingling, focal weakness, or any other complaints at this time. Denies recent travel, sick contacts, suspicious food intake, EtOH use, or frequent NSAID use.    The history is provided by the patient and  medical records. No language interpreter was used.  Abdominal Pain   This is a recurrent problem. The current episode started 2 days ago. The problem occurs constantly. The problem has not changed since onset.The pain is associated with eating. The pain is located in the epigastric region. The quality of the pain is burning. The pain is at a severity of 10/10. The pain is moderate. Associated symptoms include melena, nausea and vomiting. Pertinent negatives include fever, diarrhea, flatus, hematochezia, constipation, dysuria, hematuria, arthralgias and myalgias. The symptoms are aggravated by eating. Nothing relieves the symptoms. Past workup includes GI consult.    Past Medical History:  Diagnosis Date  . Anemia   . Gallstones   . Headache(784.0)    chronic  . Hypertension    GHTN with last pg  . Kidney stones   . Ovarian cyst   . Pregnancy induced hypertension   . UTI (lower urinary tract infection)     Patient Active Problem List   Diagnosis Date Noted  . Ruptured ectopic pregnancy 12/27/2014  . 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 Generalized headaches 03/27/2011  . H. pylori infection 02/13/2011    Past Surgical History:  Procedure Laterality Date  . CHOLECYSTECTOMY N/A 07/27/2012   Procedure: LAPAROSCOPIC CHOLECYSTECTOMY WITH INTRAOPERATIVE CHOLANGIOGRAM;  Surgeon: Madilyn Hook, DO;  Location: [facility];  Service: General;  Laterality: N/A;  . DIAGNOSTIC LAPAROSCOPY WITH REMOVAL OF ECTOPIC PREGNANCY N/A 12/27/2014   Procedure: DIAGNOSTIC LAPAROSCOPY, Right Salpingectomy with removal ECTOPIC PREGNANCY;  Surgeon: [name], MD;  Location: [facility];  Service: Gynecology;  Laterality: N/A;  . LAPAROSCOPIC APPENDECTOMY N/A 12/09/2012  Procedure: APPENDECTOMY LAPAROSCOPIC;  Surgeon: Gwenyth Ober, MD;  Location: [facility];  Service: General;  Laterality: N/A;    OB History    Gravida Para Term Preterm AB Living   3 2 1 1 1 2    SAB TAB Ectopic Multiple Live Births   0 0 1 0 2        Home Medications    Prior to Admission medications   Medication Sig Start Date End Date Taking? Authorizing Provider  Hyoscyamine Sulfate SL (LEVSIN/SL) 0.125 MG SUBL Place 0.125 mg under the tongue every 6 (six) hours as needed. 12/27/16   Robyn Haber, MD  omeprazole (PRILOSEC) 20 MG capsule Take 20 mg by mouth 2 (two) times daily. 01/15/17   [provider]  promethazine (PHENERGAN) 25 MG tablet Take 1 tablet (25 mg total) by mouth at bedtime as needed and may repeat dose one time if needed for nausea or vomiting. 12/27/16   Robyn Haber, MD    Family History Family History  Problem Relation Age of Onset  . Asthma Mother   . Hypertension Mother   . Parkinsonism Mother   . Asthma Brother   . Diabetes Maternal Grandmother   . Hypertension Maternal Grandmother   . Stroke Maternal Grandmother   . Heart disease Maternal Grandmother   . Lupus Maternal Aunt   . Heart disease Maternal Grandfather   . Anesthesia problems Neg Hx   . Malignant hyperthermia Neg Hx   . Pseudochol deficiency Neg Hx   . Hypotension Neg Hx     Social History Social History   Tobacco Use  . Smoking status: Current Every Day Smoker  . Smokeless tobacco: Never Used  . Tobacco comment: 2014  Substance Use Topics  . Alcohol use: Yes    Alcohol/week: 0.0 oz    Comment: social  . Drug use: No     Allergies   Amoxicillin; Contrast media [iodinated diagnostic agents]; and Dilaudid [hydromorphone hcl]   Review of Systems Review of Systems  Constitutional: Negative for chills and fever.  Respiratory: Negative for shortness of breath.   Cardiovascular: Negative for chest pain.  Gastrointestinal: Positive for abdominal pain, melena, nausea and vomiting. Negative for blood in stool, constipation, diarrhea, flatus and hematochezia.       +melanotic stools  Genitourinary: Negative for dysuria, hematuria, vaginal bleeding and vaginal discharge.  Musculoskeletal: Negative for  arthralgias and myalgias.  Skin: Negative for color change.  Allergic/Immunologic: Negative for immunocompromised state.  Neurological: Negative for weakness and numbness.  Psychiatric/Behavioral: Negative for confusion.   All other systems reviewed and are negative for acute change except as noted in the HPI.    Physical Exam Updated Vital Signs BP (!) 101/59   Pulse 77   Temp 98.9 F (37.2 C) (Oral)   Resp 16   Ht 5' (1.524 m)   Wt 99.8 kg (220 lb)   LMP 03/05/2017 (Exact Date)   SpO2 100%   BMI 42.97 kg/m   Physical Exam  Constitutional: She is oriented to person, place, and time. Vital signs are normal. She appears well-developed and well-nourished.  Non-toxic appearance. No distress.  Afebrile, nontoxic, NAD  HENT:  Head: Normocephalic and atraumatic.  Mouth/Throat: Oropharynx is clear and moist and mucous membranes are normal.  Eyes: Conjunctivae and EOM are normal. Right eye exhibits no discharge. Left eye exhibits no discharge.  Neck: Normal range of motion. Neck supple.  Cardiovascular: Normal rate, regular rhythm, normal heart sounds and intact distal pulses. Exam reveals  no gallop and no friction rub.  No murmur heard. Pulmonary/Chest: Effort normal and breath sounds normal. No respiratory distress. She has no decreased breath sounds. She has no wheezes. She has no rhonchi. She has no rales.  Abdominal: Soft. Normal appearance and bowel sounds are normal. She exhibits no distension. There is tenderness in the epigastric area. There is no rigidity, no rebound, no guarding, no CVA tenderness, no tenderness at McBurney's point and negative Murphy's sign.  Soft, obese but nondistended, +BS throughout, with mild epigastric TTP, no r/g/r, neg murphy's, neg mcburney's, no CVA TTP   Genitourinary: Rectal exam shows guaiac positive stool. Rectal exam shows no external hemorrhoid, no internal hemorrhoid, no fissure, no mass, no tenderness and anal tone normal.  Genitourinary  Comments: Chaperone present No gross blood noted on rectal exam, brown stool noted in rectal vault, no fecal impaction; normal tone, no tenderness, no mass or fissure, no hemorrhoids. FOBT+  Musculoskeletal: Normal range of motion.  Neurological: She is alert and oriented to person, place, and time. She has normal strength. No sensory deficit.  Skin: Skin is warm, dry and intact. No rash noted.  Psychiatric: She has a normal mood and affect.  Nursing note and vitals reviewed.    ED Treatments / Results  Labs (all labs ordered are listed, but only abnormal results are displayed) Labs Reviewed  COMPREHENSIVE METABOLIC PANEL - Abnormal; Notable for the following components:      Result Value   ALT 10 (*)    All other components within normal limits  CBC - Abnormal; Notable for the following components:   Hemoglobin 11.6 (*)    MCH 25.7 (*)    All other components within normal limits  URINALYSIS, ROUTINE W REFLEX MICROSCOPIC - Abnormal; Notable for the following components:   APPearance CLOUDY (*)    Leukocytes, UA LARGE (*)    Bacteria, UA FEW (*)    Squamous Epithelial / LPF 6-30 (*)    All other components within normal limits  POC OCCULT BLOOD, ED - Abnormal; Notable for the following components:   Fecal Occult Bld POSITIVE (*)    All other components within normal limits  LIPASE, BLOOD  I-STAT BETA HCG BLOOD, ED ([facility], [facility], [facility] ONLY)  TYPE AND SCREEN    EKG  EKG Interpretation None       Radiology No results found.  Procedures Procedures (including critical care time)  Medications Ordered in ED Medications  ondansetron (ZOFRAN-ODT) disintegrating tablet 4 mg (4 mg Oral Given 03/15/17 1032)  famotidine (PEPCID) IVPB 20 mg premix (0 mg Intravenous Stopped 03/15/17 1512)  gi cocktail (Maalox,Lidocaine,Donnatal) (30 mLs Oral Given 03/15/17 1440)  promethazine (PHENERGAN) injection 25 mg (25 mg Intravenous Given 03/15/17 1437)  sodium chloride 0.9 % bolus 1,000 mL (0  mLs Intravenous Stopped 03/15/17 1602)     Initial Impression / Assessment and Plan / ED Course  I have reviewed the triage vital signs and the nursing notes.  Pertinent labs & imaging results that were available during my care of the patient were reviewed by me and considered in my medical decision making (see chart for details).     26 y.o. female here with epigastric pain, n/v x2 days, also with melanotic stools which has occurred before. She's been seen multiple times in the past, has seen a GI specialist (?[facility]) and was on H.pylori meds last month, doesn't feel like she's getting better. On exam, mild epigastric TTP, nonperitoneal, neg murphy's, rectal exam benign. Work up  thus far shows: betaHCG neg; U/A grossly contaminated but no definite signs of UTI; lipase WNL; CMP WNL; CBC with stable anemia and otherwise unremarkable. I suspect gastritis vs PUD/GERD. Will get FOBT test, but doubt need for imaging. Will give pepcid, GI cocktail, and phenergan and reassess shortly.   4:35 PM FOBT+. Pt eloped after receiving meds. I had previously discussed with her that I suspected PUD/gastritis/GERD as the source of her symptoms, and recommended GI f/up for that. Doubt we need to call pt back in for further evaluation at this time. Pt eloped in stable condition.    Final Clinical Impressions(s) / ED Diagnoses   Final diagnoses:  Epigastric pain  Nausea and vomiting in adult patient  Occult blood positive stool  Gastritis, presence of bleeding unspecified, unspecified chronicity, unspecified gastritis type    ED Discharge Orders    [address], [place], [place] 03/15/17 1636

## 2017-03-15 NOTE — ED Triage Notes (Signed)
Pt presents for evaluation of abd pain x 2-3 days. Reports epigastric pain with N/V. Denies diarrhea but endorses dark tarry stools x 2 days. Pt reports hx of same, seeing GI dr.

## 2017-04-01 ENCOUNTER — Encounter (HOSPITAL_COMMUNITY): Payer: Self-pay

## 2017-04-01 ENCOUNTER — Emergency Department (HOSPITAL_COMMUNITY)
Admission: EM | Admit: 2017-04-01 | Discharge: 2017-04-01 | Disposition: A | Discharge status: Home / Self Care | Payer: Medicaid Other | Attending: Emergency Medicine | Admitting: Emergency Medicine

## 2017-04-01 ENCOUNTER — Other Ambulatory Visit: Payer: Self-pay

## 2017-04-01 DIAGNOSIS — Z87891 Personal history of nicotine dependence: Secondary | ICD-10-CM | POA: Insufficient documentation

## 2017-04-01 DIAGNOSIS — R1110 Vomiting, unspecified: Secondary | ICD-10-CM | POA: Insufficient documentation

## 2017-04-01 DIAGNOSIS — R1013 Epigastric pain: Secondary | ICD-10-CM | POA: Diagnosis not present

## 2017-04-01 DIAGNOSIS — Z79899 Other long term (current) drug therapy: Secondary | ICD-10-CM | POA: Insufficient documentation

## 2017-04-01 DIAGNOSIS — I10 Essential (primary) hypertension: Secondary | ICD-10-CM | POA: Diagnosis not present

## 2017-04-01 LAB — LIPASE, BLOOD: Lipase: 24 U/L (ref 11–51)

## 2017-04-01 LAB — COMPREHENSIVE METABOLIC PANEL
ALT: 13 U/L — ABNORMAL LOW (ref 14–54)
AST: 21 U/L (ref 15–41)
Albumin: 4.2 g/dL (ref 3.5–5.0)
Alkaline Phosphatase: 70 U/L (ref 38–126)
Anion gap: 8 (ref 5–15)
BUN: 11 mg/dL (ref 6–20)
CO2: 23 mmol/L (ref 22–32)
Calcium: 8.9 mg/dL (ref 8.9–10.3)
Chloride: 107 mmol/L (ref 101–111)
Creatinine, Ser: 0.58 mg/dL (ref 0.44–1.00)
GFR calc Af Amer: >60 mL/min (ref >60)
GFR calc non Af Amer: >60 mL/min (ref >60)
Glucose, Bld: 96 mg/dL (ref 65–99)
Potassium: 3.4 mmol/L — ABNORMAL LOW (ref 3.5–5.1)
Sodium: 138 mmol/L (ref 135–145)
Total Bilirubin: 0.3 mg/dL (ref 0.3–1.2)
Total Protein: 7.9 g/dL (ref 6.5–8.1)

## 2017-04-01 LAB — CBC
HCT: 36.8 % (ref 36.0–46.0)
Hemoglobin: 12 g/dL (ref 12.0–15.0)
MCH: 26.8 pg (ref 26.0–34.0)
MCHC: 32.6 g/dL (ref 30.0–36.0)
MCV: 82.3 fL (ref 78.0–100.0)
Platelets: 315 10*3/uL (ref 150–400)
RBC: 4.47 MIL/uL (ref 3.87–5.11)
RDW: 16.1 % — ABNORMAL HIGH (ref 11.5–15.5)
WBC: 10.1 10*3/uL (ref 4.0–10.5)

## 2017-04-01 LAB — URINALYSIS, ROUTINE W REFLEX MICROSCOPIC
Bilirubin Urine: NEGATIVE
Glucose, UA: NEGATIVE mg/dL
Hgb urine dipstick: NEGATIVE
Ketones, ur: NEGATIVE mg/dL
Leukocytes, UA: NEGATIVE
Nitrite: NEGATIVE
Protein, ur: NEGATIVE mg/dL
Specific Gravity, Urine: 1.023 (ref 1.005–1.030)
pH: 6 (ref 5.0–8.0)

## 2017-04-01 LAB — POC URINE PREG, ED: Preg Test, Ur: NEGATIVE

## 2017-04-01 MED ORDER — SUCRALFATE 1 G PO TABS: 1.0000 g | ORAL_TABLET | Freq: Three times a day (TID) | ORAL | 0 refills | Status: DC

## 2017-04-01 MED ORDER — ONDANSETRON HCL 4 MG PO TABS: 4.0000 mg | ORAL_TABLET | Freq: Four times a day (QID) | ORAL | 0 refills | Status: DC

## 2017-04-01 MED ORDER — ONDANSETRON HCL 4 MG/2ML IJ SOLN
4.0000 mg | Freq: Once | INTRAMUSCULAR | Status: AC
  Administered 2017-04-01: 4 mg via INTRAVENOUS
  Filled 2017-04-01: qty 2

## 2017-04-01 MED ORDER — SODIUM CHLORIDE 0.9 % IV BOLUS (SEPSIS)
1000.0000 mL | Freq: Once | INTRAVENOUS | Status: AC
  Administered 2017-04-01: 1000 mL via INTRAVENOUS

## 2017-04-01 MED ORDER — FAMOTIDINE IN NACL 20-0.9 MG/50ML-% IV SOLN
20.0000 mg | Freq: Once | INTRAVENOUS | Status: AC
  Administered 2017-04-01: 20 mg via INTRAVENOUS
  Filled 2017-04-01: qty 50

## 2017-04-01 MED ORDER — PANTOPRAZOLE SODIUM 40 MG IV SOLR
40.0000 mg | Freq: Once | INTRAVENOUS | Status: AC
  Administered 2017-04-01: 40 mg via INTRAVENOUS
  Filled 2017-04-01: qty 40

## 2017-04-01 NOTE — ED Triage Notes (Signed)
Patient states she has had mid upper abdominal pain for years, but recently has had vomiting x 3 days. Patient also reports she had a black tarry stool this AM as well.

## 2017-04-01 NOTE — ED Provider Notes (Addendum)
[facility] DEPT Provider Note   CSN: [id number] Arrival date & time: 04/01/17  3500     History   Chief Complaint Chief Complaint  Patient presents with  . Abdominal Pain  . Emesis    HPI Emma Chambers is a 27 y.o. female.  HPI   27 year old female with abdominal pain.  Pain is epigastric.  She reports has had similar pain intermittently over the past several years.  Is been worse over the last few days.  Seems to come on at night and then will improve some time.  She is here with nausea and vomiting.  She reports previous evaluation by gastroenterology.  From her description, it sounds like she was treated for H. pylori.  She reports compliance with Prilosec.  No fevers or chills.  No urinary complaints.  She is status post cholecystectomy.  Her stools have been dark over the past couple days as well.  Denies significant NSAID usage.  Rarely drinks alcohol.  Past Medical History:  Diagnosis Date  . Anemia   . Gallstones   . Headache(784.0)    chronic  . Hypertension    GHTN with last pg  . Kidney stones   . Ovarian cyst   . Pregnancy induced hypertension   . UTI (lower urinary tract infection)     Patient Active Problem List   Diagnosis Date Noted  . Ruptured ectopic pregnancy 12/27/2014  . 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 Generalized headaches 03/27/2011  . H. pylori infection 02/13/2011    Past Surgical History:  Procedure Laterality Date  . CHOLECYSTECTOMY N/A 07/27/2012   Procedure: LAPAROSCOPIC CHOLECYSTECTOMY WITH INTRAOPERATIVE CHOLANGIOGRAM;  Surgeon: Madilyn Hook, DO;  Location: [facility];  Service: General;  Laterality: N/A;  . DIAGNOSTIC LAPAROSCOPY WITH REMOVAL OF ECTOPIC PREGNANCY N/A 12/27/2014   Procedure: DIAGNOSTIC LAPAROSCOPY, Right Salpingectomy with removal ECTOPIC PREGNANCY;  Surgeon: [name], MD;  Location: [facility];  Service: Gynecology;  Laterality: N/A;  . LAPAROSCOPIC APPENDECTOMY N/A 12/09/2012   Procedure:  APPENDECTOMY LAPAROSCOPIC;  Surgeon: Gwenyth Ober, MD;  Location: [facility];  Service: General;  Laterality: N/A;    OB History    Gravida Para Term Preterm AB Living   3 2 1 1 1 2    SAB TAB Ectopic Multiple Live Births   0 0 1 0 2       Home Medications    Prior to Admission medications   Medication Sig Start Date End Date Taking? Authorizing Provider  Hyoscyamine Sulfate SL (LEVSIN/SL) 0.125 MG SUBL Place 0.125 mg under the tongue every 6 (six) hours as needed. 12/27/16   Robyn Haber, MD  omeprazole (PRILOSEC) 20 MG capsule Take 20 mg by mouth 2 (two) times daily. 01/15/17   [provider]  promethazine (PHENERGAN) 25 MG tablet Take 1 tablet (25 mg total) by mouth at bedtime as needed and may repeat dose one time if needed for nausea or vomiting. 12/27/16   Robyn Haber, MD    Family History Family History  Problem Relation Age of Onset  . Asthma Mother   . Hypertension Mother   . Parkinsonism Mother   . Asthma Brother   . Diabetes Maternal Grandmother   . Hypertension Maternal Grandmother   . Stroke Maternal Grandmother   . Heart disease Maternal Grandmother   . Lupus Maternal Aunt   . Heart disease Maternal Grandfather   . Anesthesia problems Neg Hx   . Malignant hyperthermia Neg Hx   . Pseudochol  deficiency Neg Hx   . Hypotension Neg Hx     Social History Social History   Tobacco Use  . Smoking status: Former Smoker    Types: Cigarettes  . Smokeless tobacco: Never Used  . Tobacco comment: 2014  Substance Use Topics  . Alcohol use: Yes    Alcohol/week: 0.0 oz    Comment: social  . Drug use: No     Allergies   Amoxicillin; Contrast media [iodinated diagnostic agents]; and Dilaudid [hydromorphone hcl]   Review of Systems Review of Systems  All systems reviewed and negative, other than as noted in HPI.  Physical Exam Updated Vital Signs BP 114/67   Pulse 85   Temp 99 F (37.2 C)   Resp 15   Ht 5' (1.524 m)   Wt 99.8 kg (220 lb)    LMP 03/05/2017 (Exact Date)   SpO2 100%   BMI 42.97 kg/m   Physical Exam  Constitutional: She appears well-developed and well-nourished. No distress.  Laying in bed.  No acute distress.  Obese.  HENT:  Head: Normocephalic and atraumatic.  Eyes: Conjunctivae are normal. Right eye exhibits no discharge. Left eye exhibits no discharge.  Neck: Neck supple.  Cardiovascular: Normal rate, regular rhythm and normal heart sounds. Exam reveals no gallop and no friction rub.  No murmur heard. Pulmonary/Chest: Effort normal and breath sounds normal. No respiratory distress.  Abdominal: Soft. She exhibits no distension. There is no tenderness.  Mild epigastric tenderness without rebound or guarding.  No distention.  Musculoskeletal: She exhibits no edema or tenderness.  Neurological: She is alert.  Skin: Skin is warm and dry.  Psychiatric: She has a normal mood and affect. Her behavior is normal. Thought content normal.  Nursing note and vitals reviewed.    ED Treatments / Results  Labs (all labs ordered are listed, but only abnormal results are displayed) Labs Reviewed  COMPREHENSIVE METABOLIC PANEL - Abnormal; Notable for the following components:      Result Value   Potassium 3.4 (*)    ALT 13 (*)    All other components within normal limits  CBC - Abnormal; Notable for the following components:   RDW 16.1 (*)    All other components within normal limits  LIPASE, BLOOD  URINALYSIS, ROUTINE W REFLEX MICROSCOPIC  POC URINE PREG, ED    EKG  EKG Interpretation None       Radiology No results found.  Procedures Procedures (including critical care time)  Medications Ordered in ED Medications  ondansetron (ZOFRAN) injection 4 mg (not administered)  famotidine (PEPCID) IVPB 20 mg premix (not administered)  pantoprazole (PROTONIX) injection 40 mg (not administered)  sodium chloride 0.9 % bolus 1,000 mL (not administered)     Initial Impression / Assessment and Plan /  ED Course  I have reviewed the triage vital signs and the nursing notes.  Pertinent labs & imaging results that were available during my care of the patient were reviewed by me and considered in my medical decision making (see chart for details).     27 year old female with epigastric pain.  From her description it sounds like this is PUD or reflux.  She reports compliance with her PPI.  We will treat her symptoms.  Check basic labs.  She is status post cholecystectomy.  She has no respiratory complaints.  Labs unremarkable.  She is feeling somewhat better.  She is reporting dark stool but has had the same complaint multiple times previously.  She is healing apically  stable.  She is not anemic.  She reports compliance with her PPI.  We will give a trial of Carafate.  Return precautions were discussed.  Outpatient follow-up with her PCP or GI otherwise.  10:37 AM At time of discharge, pt still c/o burning when she lays back and coughing up "yellow stuff." This is likely reflux. Consistent with her saying symptoms often occur at night. Can sleep inclined. Lose weight. Take PPI regularly. Diet discussed.   Final Clinical Impressions(s) / ED Diagnoses   Final diagnoses:  Epigastric pain    ED Discharge Orders        Ordered    sucralfate (CARAFATE) 1 g tablet  3 times daily with meals & bedtime     04/01/17 1030    ondansetron (ZOFRAN) 4 MG tablet  Every 6 hours     04/01/17 1030       Virgel Manifold, MD 04/01/17 1033    Virgel Manifold, MD 04/01/17 1039

## 2017-04-08 ENCOUNTER — Other Ambulatory Visit: Payer: Self-pay | Admitting: Gastroenterology

## 2017-04-08 DIAGNOSIS — R1033 Periumbilical pain: Secondary | ICD-10-CM

## 2017-04-08 DIAGNOSIS — R112 Nausea with vomiting, unspecified: Secondary | ICD-10-CM

## 2017-04-14 ENCOUNTER — Ambulatory Visit
Admission: RE | Admit: 2017-04-14 | Discharge: 2017-04-14 | Disposition: A | Discharge status: Home / Self Care | Payer: Medicaid Other | Source: Ambulatory Visit | Attending: Gastroenterology | Admitting: Gastroenterology

## 2017-04-14 DIAGNOSIS — R112 Nausea with vomiting, unspecified: Secondary | ICD-10-CM

## 2017-04-14 DIAGNOSIS — R1033 Periumbilical pain: Secondary | ICD-10-CM

## 2017-04-15 ENCOUNTER — Inpatient Hospital Stay: Admission: RE | Admit: 2017-04-15 | Payer: Medicaid Other | Source: Ambulatory Visit

## 2017-04-21 ENCOUNTER — Ambulatory Visit
Admission: RE | Admit: 2017-04-21 | Discharge: 2017-04-21 | Disposition: A | Discharge status: Home / Self Care | Payer: Medicaid Other | Source: Ambulatory Visit | Attending: Gastroenterology | Admitting: Gastroenterology

## 2017-04-21 MED ORDER — IOPAMIDOL (ISOVUE-300) INJECTION 61%
125.0000 mL | Freq: Once | INTRAVENOUS | Status: AC | PRN
  Administered 2017-04-21: 125 mL via INTRAVENOUS

## 2017-06-13 ENCOUNTER — Encounter (HOSPITAL_COMMUNITY): Payer: Self-pay | Admitting: *Deleted

## 2017-06-13 ENCOUNTER — Emergency Department (HOSPITAL_COMMUNITY)
Admission: EM | Admit: 2017-06-13 | Discharge: 2017-06-13 | Disposition: A | Discharge status: Home / Self Care | Payer: Medicaid Other | Attending: Emergency Medicine | Admitting: Emergency Medicine

## 2017-06-13 ENCOUNTER — Other Ambulatory Visit: Payer: Self-pay

## 2017-06-13 DIAGNOSIS — R55 Syncope and collapse: Secondary | ICD-10-CM | POA: Insufficient documentation

## 2017-06-13 DIAGNOSIS — Z5321 Procedure and treatment not carried out due to patient leaving prior to being seen by health care provider: Secondary | ICD-10-CM | POA: Diagnosis not present

## 2017-06-13 LAB — BASIC METABOLIC PANEL
Anion gap: 7 (ref 5–15)
BUN: 8 mg/dL (ref 6–20)
CO2: 25 mmol/L (ref 22–32)
Calcium: 9.3 mg/dL (ref 8.9–10.3)
Chloride: 107 mmol/L (ref 101–111)
Creatinine, Ser: 0.63 mg/dL (ref 0.44–1.00)
GFR calc Af Amer: >60 mL/min (ref >60)
GFR calc non Af Amer: >60 mL/min (ref >60)
Glucose, Bld: 87 mg/dL (ref 65–99)
Potassium: 2.9 mmol/L — ABNORMAL LOW (ref 3.5–5.1)
Sodium: 139 mmol/L (ref 135–145)

## 2017-06-13 LAB — CBC
HCT: 35.4 % — ABNORMAL LOW (ref 36.0–46.0)
Hemoglobin: 11.3 g/dL — ABNORMAL LOW (ref 12.0–15.0)
MCH: 26.7 pg (ref 26.0–34.0)
MCHC: 31.9 g/dL (ref 30.0–36.0)
MCV: 83.5 fL (ref 78.0–100.0)
Platelets: 321 10*3/uL (ref 150–400)
RBC: 4.24 MIL/uL (ref 3.87–5.11)
RDW: 15.7 % — ABNORMAL HIGH (ref 11.5–15.5)
WBC: 7.2 10*3/uL (ref 4.0–10.5)

## 2017-06-13 LAB — I-STAT BETA HCG BLOOD, ED (MC, WL, AP ONLY): I-stat hCG, quantitative: <5 m[IU]/mL (ref <5)

## 2017-06-13 LAB — CBG MONITORING, ED: Glucose-Capillary: 83 mg/dL (ref 65–99)

## 2017-06-13 NOTE — ED Triage Notes (Signed)
Pt has not been able to eat for 4 days and reports weakness. Pt states she passed twice today at work. Pt has something going on with her stomach and has appt on Monday for scope.

## 2017-10-12 ENCOUNTER — Ambulatory Visit (HOSPITAL_COMMUNITY)
Admission: EM | Admit: 2017-10-12 | Discharge: 2017-10-12 | Disposition: A | Discharge status: Home / Self Care | Payer: Medicaid Other | Attending: Internal Medicine | Admitting: Internal Medicine

## 2017-10-12 ENCOUNTER — Encounter (HOSPITAL_COMMUNITY): Payer: Self-pay

## 2017-10-12 DIAGNOSIS — N644 Mastodynia: Secondary | ICD-10-CM

## 2017-10-12 NOTE — ED Triage Notes (Signed)
Pt presents with complaints of right breast pain and drainage x 2 weeks.

## 2017-10-12 NOTE — ED Provider Notes (Signed)
[place]    CSN: [id number] Arrival date & time: 10/12/17  1712     History   Chief Complaint No chief complaint on file.   HPI Emma Chambers is a 27 y.o. female   No contributing past medical history presenting today for evaluation of right breast pain.  Patient states that over the past 2 weeks she has had persistent constant breast pain.  Notes that it is worse on the lateral side.  Initially she had a clear to creamy discharge.  Denies bloody discharge.  Patient is not breast-feeding.  Denies any changes in her skin.  She has been taking Naprosyn for pain without relief.  Denies any palpated bumps of concern.  Patient notes that her grandmother had breast cancer, but is unsure of at what age.  LMP 7/1, not on any form of birth control.     Past Medical History:  Diagnosis Date  . Anemia   . Gallstones   . Headache(784.0)    chronic  . Hypertension    GHTN with last pg  . Kidney stones   . Ovarian cyst   . Pregnancy induced hypertension   . UTI (lower urinary tract infection)     Patient Active Problem List   Diagnosis Date Noted  . Ruptured ectopic pregnancy 12/27/2014  . 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 Generalized headaches 03/27/2011  . H. pylori infection 02/13/2011    Past Surgical History:  Procedure Laterality Date  . CHOLECYSTECTOMY N/A 07/27/2012   Procedure: LAPAROSCOPIC CHOLECYSTECTOMY WITH INTRAOPERATIVE CHOLANGIOGRAM;  Surgeon: Madilyn Hook, DO;  Location: [facility];  Service: General;  Laterality: N/A;  . DIAGNOSTIC LAPAROSCOPY WITH REMOVAL OF ECTOPIC PREGNANCY N/A 12/27/2014   Procedure: DIAGNOSTIC LAPAROSCOPY, Right Salpingectomy with removal ECTOPIC PREGNANCY;  Surgeon: [name], MD;  Location: [facility];  Service: Gynecology;  Laterality: N/A;  . LAPAROSCOPIC APPENDECTOMY N/A 12/09/2012   Procedure: APPENDECTOMY LAPAROSCOPIC;  Surgeon: Gwenyth Ober, MD;  Location: [facility];  Service: General;  Laterality: N/A;    OB History    Gravida  3   Para  2   Term  1   Preterm  1   AB  1   Living  2     SAB  0   TAB  0   Ectopic  1   Multiple  0   Live Births  2            Home Medications    Prior to Admission medications   Medication Sig Start Date End Date Taking? Authorizing Provider  naproxen (NAPROSYN) 500 MG tablet Take 500 mg by mouth 2 (two) times daily with a meal.   Yes [provider]  Hyoscyamine Sulfate SL (LEVSIN/SL) 0.125 MG SUBL Place 0.125 mg under the tongue every 6 (six) hours as needed. 12/27/16   Robyn Haber, MD  omeprazole (PRILOSEC) 20 MG capsule Take 20 mg by mouth 2 (two) times daily. 01/15/17   [provider]  ondansetron (ZOFRAN) 4 MG tablet Take 1 tablet (4 mg total) by mouth every 6 (six) hours. 04/01/17   Virgel Manifold, MD  promethazine (PHENERGAN) 25 MG tablet Take 1 tablet (25 mg total) by mouth at bedtime as needed and may repeat dose one time if needed for nausea or vomiting. 12/27/16   Robyn Haber, MD  sucralfate (CARAFATE) 1 g tablet Take 1 tablet (1 g total) by mouth 4 (four) times daily -  with meals and at bedtime. 04/01/17  Virgel Manifold, MD    Family History Family History  Problem Relation Age of Onset  . Asthma Mother   . Hypertension Mother   . Parkinsonism Mother   . Asthma Brother   . Diabetes Maternal Grandmother   . Hypertension Maternal Grandmother   . Stroke Maternal Grandmother   . Heart disease Maternal Grandmother   . Lupus Maternal Aunt   . Heart disease Maternal Grandfather   . Anesthesia problems Neg Hx   . Malignant hyperthermia Neg Hx   . Pseudochol deficiency Neg Hx   . Hypotension Neg Hx     Social History Social History   Tobacco Use  . Smoking status: Former Smoker    Types: Cigarettes  . Smokeless tobacco: Never Used  . Tobacco comment: 2014  Substance Use Topics  . Alcohol use: Yes    Comment: social  . Drug use: No     Allergies   Amoxicillin; Contrast media [iodinated diagnostic  agents]; and Dilaudid [hydromorphone hcl]   Review of Systems Review of Systems  Constitutional: Negative for fatigue and fever.  Eyes: Negative for visual disturbance.  Respiratory: Negative for shortness of breath.   Cardiovascular: Positive for chest pain.       Breast pain  Gastrointestinal: Negative for abdominal pain, nausea and vomiting.  Musculoskeletal: Negative for arthralgias and joint swelling.  Skin: Negative for color change, rash and wound.  Neurological: Negative for dizziness, weakness, light-headedness and headaches.     Physical Exam Triage Vital Signs ED Triage Vitals  Enc Vitals Group     BP 10/12/17 1755 (!) 127/57     Pulse Rate 10/12/17 1755 87     Resp 10/12/17 1755 18     Temp 10/12/17 1755 98 F (36.7 C)     Temp src --      SpO2 10/12/17 1755 100 %     Weight --      Height --      Head Circumference --      Peak Flow --      Pain Score 10/12/17 1754 8     Pain Loc --      Pain Edu? --      Excl. in [place]? --    No data found.  Updated Vital Signs BP (!) 127/57   Pulse 87   Temp 98 F (36.7 C)   Resp 18   LMP 09/15/2017   SpO2 100%   Visual Acuity Right Eye Distance:   Left Eye Distance:   Bilateral Distance:    Right Eye Near:   Left Eye Near:    Bilateral Near:     Physical Exam  Constitutional: She is oriented to person, place, and time. She appears well-developed and well-nourished.  No acute distress  HENT:  Head: Normocephalic and atraumatic.  Nose: Nose normal.  Eyes: Conjunctivae are normal.  Neck: Neck supple.  Cardiovascular: Normal rate.  Pulmonary/Chest: Effort normal. No respiratory distress.  Breathing comfortably at rest, CTABL, no wheezing, rales or other adventitious sounds auscultated  Abdominal: She exhibits no distension.  Genitourinary:  Genitourinary Comments: Right breast without any erythema or overlying skin changes, no induration noted with palpation of breast, patient does have dense breasts, no  obvious mass palpated throughout the entire breast, patient does experience significant tenderness with palpation of breast on the right half of the breast, but more focally around 9:00, no discharge seen or expressed from nipple  Musculoskeletal: Normal range of motion.  Neurological: She is alert  and oriented to person, place, and time.  Skin: Skin is warm and dry.  Psychiatric: She has a normal mood and affect.  Nursing note and vitals reviewed.    UC Treatments / Results  Labs (all labs ordered are listed, but only abnormal results are displayed) Labs Reviewed - No data to display  EKG None  Radiology No results found.  Procedures Procedures (including critical care time)  Medications Ordered in UC Medications - No data to display  Initial Impression / Assessment and Plan / UC Course  I have reviewed the triage vital signs and the nursing notes.  Pertinent labs & imaging results that were available during my care of the patient were reviewed by me and considered in my medical decision making (see chart for details).     Discussed with patient breast tenderness most likely related to hormones/fibrocystic changes related to beginning cycle, but given patient has never experienced this with her cycle before as well as having abnormal discharge will refer patient to the breast center for further evaluation with mammogram/ultrasound as needed.  Recommend continuing anti-inflammatories in the meantime.Discussed strict return precautions. Patient verbalized understanding and is agreeable with plan.  Final Clinical Impressions(s) / UC Diagnoses   Final diagnoses:  Breast pain, right     Discharge Instructions     Use anti-inflammatories for pain/swelling. You may take up to 800 mg Ibuprofen every 8 hours with food. You may supplement Ibuprofen with Tylenol [phone] mg every 8 hours.   Pain may be related to cycle about to begin  We have sent over a referal to the [facility], they will call you with your appointment time   ED Prescriptions    None     Controlled Substance Prescriptions [place] Controlled Substance Registry consulted? Not Applicable   Janith Lima, [place] 10/12/17 1851

## 2017-10-12 NOTE — Discharge Instructions (Addendum)
Use anti-inflammatories for pain/swelling. You may take up to 800 mg Ibuprofen every 8 hours with food. You may supplement Ibuprofen with Tylenol [phone] mg every 8 hours.   Pain may be related to cycle about to begin  We have sent over a referal to the [facility], they will call you with your appointment time

## 2017-10-14 ENCOUNTER — Other Ambulatory Visit: Payer: Self-pay | Admitting: Emergency Medicine

## 2017-10-14 DIAGNOSIS — N644 Mastodynia: Secondary | ICD-10-CM

## 2017-10-17 ENCOUNTER — Other Ambulatory Visit: Payer: Medicaid Other

## 2017-10-19 ENCOUNTER — Inpatient Hospital Stay (HOSPITAL_COMMUNITY): Payer: Medicaid Other

## 2017-10-19 ENCOUNTER — Encounter (HOSPITAL_COMMUNITY): Payer: Self-pay

## 2017-10-19 ENCOUNTER — Inpatient Hospital Stay (HOSPITAL_COMMUNITY)
Admission: AD | Admit: 2017-10-19 | Discharge: 2017-10-19 | Disposition: A | Discharge status: Home / Self Care | Payer: Medicaid Other | Source: Ambulatory Visit | Attending: Obstetrics & Gynecology | Admitting: Obstetrics & Gynecology

## 2017-10-19 DIAGNOSIS — Z87891 Personal history of nicotine dependence: Secondary | ICD-10-CM | POA: Insufficient documentation

## 2017-10-19 DIAGNOSIS — Z8249 Family history of ischemic heart disease and other diseases of the circulatory system: Secondary | ICD-10-CM | POA: Insufficient documentation

## 2017-10-19 DIAGNOSIS — Z87440 Personal history of urinary (tract) infections: Secondary | ICD-10-CM | POA: Diagnosis not present

## 2017-10-19 DIAGNOSIS — O3680X0 Pregnancy with inconclusive fetal viability, not applicable or unspecified: Secondary | ICD-10-CM | POA: Diagnosis not present

## 2017-10-19 DIAGNOSIS — O26891 Other specified pregnancy related conditions, first trimester: Secondary | ICD-10-CM

## 2017-10-19 DIAGNOSIS — Z885 Allergy status to narcotic agent status: Secondary | ICD-10-CM | POA: Insufficient documentation

## 2017-10-19 DIAGNOSIS — O210 Mild hyperemesis gravidarum: Secondary | ICD-10-CM | POA: Diagnosis not present

## 2017-10-19 DIAGNOSIS — Z3A01 Less than 8 weeks gestation of pregnancy: Secondary | ICD-10-CM | POA: Insufficient documentation

## 2017-10-19 DIAGNOSIS — Z79899 Other long term (current) drug therapy: Secondary | ICD-10-CM | POA: Diagnosis not present

## 2017-10-19 DIAGNOSIS — O283 Abnormal ultrasonic finding on antenatal screening of mother: Secondary | ICD-10-CM | POA: Diagnosis not present

## 2017-10-19 DIAGNOSIS — R109 Unspecified abdominal pain: Secondary | ICD-10-CM | POA: Diagnosis not present

## 2017-10-19 DIAGNOSIS — Z87442 Personal history of urinary calculi: Secondary | ICD-10-CM | POA: Insufficient documentation

## 2017-10-19 DIAGNOSIS — O219 Vomiting of pregnancy, unspecified: Secondary | ICD-10-CM

## 2017-10-19 DIAGNOSIS — Z880 Allergy status to penicillin: Secondary | ICD-10-CM | POA: Insufficient documentation

## 2017-10-19 LAB — URINALYSIS, ROUTINE W REFLEX MICROSCOPIC
Bacteria, UA: NONE SEEN
Glucose, UA: NEGATIVE mg/dL
Hgb urine dipstick: NEGATIVE
Ketones, ur: NEGATIVE mg/dL
Leukocytes, UA: NEGATIVE
Nitrite: NEGATIVE
Protein, ur: 30 mg/dL — AB
Specific Gravity, Urine: 1.036 — ABNORMAL HIGH (ref 1.005–1.030)
pH: 5 (ref 5.0–8.0)

## 2017-10-19 LAB — CBC
HCT: 36.5 % (ref 36.0–46.0)
Hemoglobin: 12.3 g/dL (ref 12.0–15.0)
MCH: 27.8 pg (ref 26.0–34.0)
MCHC: 33.7 g/dL (ref 30.0–36.0)
MCV: 82.6 fL (ref 78.0–100.0)
Platelets: 380 10*3/uL (ref 150–400)
RBC: 4.42 MIL/uL (ref 3.87–5.11)
RDW: 15.3 % (ref 11.5–15.5)
WBC: 9.4 10*3/uL (ref 4.0–10.5)

## 2017-10-19 LAB — ABO/RH: ABO/RH(D): A POS

## 2017-10-19 LAB — HCG, QUANTITATIVE, PREGNANCY: hCG, Beta Chain, Quant, S: 3814 m[IU]/mL — ABNORMAL HIGH (ref <5)

## 2017-10-19 LAB — POCT PREGNANCY, URINE: Preg Test, Ur: POSITIVE — AB

## 2017-10-19 MED ORDER — ONDANSETRON 8 MG PO TBDP: 8.0000 mg | ORAL_TABLET | Freq: Three times a day (TID) | ORAL | 0 refills | Status: DC | PRN

## 2017-10-19 NOTE — MAU Note (Signed)
Pt took HPT yesterday and it was positive. Having abdominal pain for 3 weeks. Pain is 6/10. No bleeding.

## 2017-10-19 NOTE — MAU Provider Note (Signed)
History     CSN: [id number]  Arrival date and time: 10/19/17 1041   First Provider Initiated Contact with Patient 10/19/17 1323      Chief Complaint  Patient presents with  . Abdominal Pain  . Possible Pregnancy   HPI Ms. Emma Chambers is a 27 y.o. [id number] at [redacted]w[redacted]d who presents to MAU today with complaint of lower abdominal cramping today. The patient had recent +HPT. She has a history of ectopic requiring surgery in 2016. She states sure LMP 09/15/17. She denies vaginal bleeding or abnormal discharge. She denies UTI symptoms or fever. She has had N/V since last night. She denies diarrhea. She had STD testing at [facility] earlier this week and declines additional testing today.   OB History    Gravida  4   Para  2   Term  1   Preterm  1   AB  1   Living  2     SAB  0   TAB  0   Ectopic  1   Multiple  0   Live Births  2           Past Medical History:  Diagnosis Date  . Anemia   . Gallstones   . Headache(784.0)    chronic  . Hypertension    GHTN with last pg  . Kidney stones   . Ovarian cyst   . Pregnancy induced hypertension   . UTI (lower urinary tract infection)     Past Surgical History:  Procedure Laterality Date  . CHOLECYSTECTOMY N/A 07/27/2012   Procedure: LAPAROSCOPIC CHOLECYSTECTOMY WITH INTRAOPERATIVE CHOLANGIOGRAM;  Surgeon: Madilyn Hook, DO;  Location: [facility];  Service: General;  Laterality: N/A;  . DIAGNOSTIC LAPAROSCOPY WITH REMOVAL OF ECTOPIC PREGNANCY N/A 12/27/2014   Procedure: DIAGNOSTIC LAPAROSCOPY, Right Salpingectomy with removal ECTOPIC PREGNANCY;  Surgeon: [name], MD;  Location: [facility];  Service: Gynecology;  Laterality: N/A;  . LAPAROSCOPIC APPENDECTOMY N/A 12/09/2012   Procedure: APPENDECTOMY LAPAROSCOPIC;  Surgeon: Gwenyth Ober, MD;  Location: [facility];  Service: General;  Laterality: N/A;    Family History  Problem Relation Age of Onset  . Asthma Mother   . Hypertension Mother   . Parkinsonism Mother   .  Asthma Brother   . Diabetes Maternal Grandmother   . Hypertension Maternal Grandmother   . Stroke Maternal Grandmother   . Heart disease Maternal Grandmother   . Lupus Maternal Aunt   . Heart disease Maternal Grandfather   . Anesthesia problems Neg Hx   . Malignant hyperthermia Neg Hx   . Pseudochol deficiency Neg Hx   . Hypotension Neg Hx     Social History   Tobacco Use  . Smoking status: Former Smoker    Types: Cigarettes  . Smokeless tobacco: Never Used  . Tobacco comment: 2014  Substance Use Topics  . Alcohol use: Yes    Comment: social  . Drug use: No    Allergies:  Allergies  Allergen Reactions  . Amoxicillin Nausea And Vomiting and Other (See Comments)    Tics & twitches Has patient had a PCN reaction causing immediate rash, facial/tongue/throat swelling, SOB or lightheadedness with hypotension: Yes Has patient had a PCN reaction causing severe rash involving mucus membranes or skin necrosis: No Has patient had a PCN reaction that required hospitalization No Has patient had a PCN reaction occurring within the last 10 years: Yes If all of the above answers are "NO", then may proceed with Cephalosporin use.  Marland Kitchen  Contrast Media [Iodinated Diagnostic Agents] Hives  . Dilaudid [Hydromorphone Hcl] Itching    Medications Prior to Admission  Medication Sig Dispense Refill Last Dose  . Hyoscyamine Sulfate SL (LEVSIN/SL) 0.125 MG SUBL Place 0.125 mg under the tongue every 6 (six) hours as needed. 120 each 0 03/31/2017 at Unknown time  . naproxen (NAPROSYN) 500 MG tablet Take 500 mg by mouth 2 (two) times daily with a meal.   10/12/2017 at Unknown time  . omeprazole (PRILOSEC) 20 MG capsule Take 20 mg by mouth 2 (two) times daily.  0 03/31/2017 at Unknown time  . ondansetron (ZOFRAN) 4 MG tablet Take 1 tablet (4 mg total) by mouth every 6 (six) hours. 12 tablet 0   . promethazine (PHENERGAN) 25 MG tablet Take 1 tablet (25 mg total) by mouth at bedtime as needed and may repeat  dose one time if needed for nausea or vomiting. 15 tablet 0 03/31/2017 at Unknown time  . sucralfate (CARAFATE) 1 g tablet Take 1 tablet (1 g total) by mouth 4 (four) times daily -  with meals and at bedtime. 30 tablet 0     Review of Systems  Constitutional: Negative for fever.  Gastrointestinal: Positive for abdominal pain, nausea and vomiting. Negative for constipation and diarrhea.  Genitourinary: Negative for dysuria, frequency, urgency, vaginal bleeding and vaginal discharge.   Physical Exam   Blood pressure 117/68, pulse 94, temperature 99 F (37.2 C), temperature source Oral, resp. rate 16, height 5' (1.524 m), weight 204 lb (92.5 kg), last menstrual period 09/15/2017, SpO2 100 %.  Physical Exam  Nursing note and vitals reviewed. Constitutional: She is oriented to person, place, and time. She appears well-developed and well-nourished. No distress.  HENT:  Head: Normocephalic and atraumatic.  Cardiovascular: Normal rate.  Respiratory: Effort normal.  GI: Soft. She exhibits no distension. There is no tenderness.  Genitourinary:  Genitourinary Comments: Patient refused pelvic exam  Neurological: She is alert and oriented to person, place, and time.  Skin: Skin is warm and dry. No erythema.  Psychiatric: She has a normal mood and affect.     Results for orders placed or performed during the hospital encounter of 10/19/17 (from the past 24 hour(s))  Urinalysis, Routine w reflex microscopic     Status: Abnormal   Collection Time: 10/19/17 11:21 AM  Result Value Ref Range   Color, Urine YELLOW YELLOW   APPearance HAZY (A) CLEAR   Specific Gravity, Urine 1.036 (H) 1.005 - 1.030   pH 5.0 5.0 - 8.0   Glucose, UA NEGATIVE NEGATIVE mg/dL   Hgb urine dipstick NEGATIVE NEGATIVE   Bilirubin Urine SMALL (A) NEGATIVE   Ketones, ur NEGATIVE NEGATIVE mg/dL   Protein, ur 30 (A) NEGATIVE mg/dL   Nitrite NEGATIVE NEGATIVE   Leukocytes, UA NEGATIVE NEGATIVE   RBC / HPF 6-10 0 - 5 RBC/hpf    WBC, UA 0-5 0 - 5 WBC/hpf   Bacteria, UA NONE SEEN NONE SEEN   Squamous Epithelial / LPF 6-10 0 - 5   Mucus PRESENT   Pregnancy, urine POC     Status: Abnormal   Collection Time: 10/19/17 11:25 AM  Result Value Ref Range   Preg Test, Ur POSITIVE (A) NEGATIVE  hCG, quantitative, pregnancy     Status: Abnormal   Collection Time: 10/19/17 12:30 PM  Result Value Ref Range   hCG, Beta Chain, Quant, S 3,814 (H) <5 mIU/mL  CBC     Status: None   Collection Time: 10/19/17 12:30 PM  Result Value Ref Range   WBC 9.4 4.0 - 10.5 K/uL   RBC 4.42 3.87 - 5.11 MIL/uL   Hemoglobin 12.3 12.0 - 15.0 g/dL   HCT 36.5 36.0 - 46.0 %   MCV 82.6 78.0 - 100.0 fL   MCH 27.8 26.0 - 34.0 pg   MCHC 33.7 30.0 - 36.0 g/dL   RDW 15.3 11.5 - 15.5 %   Platelets 380 150 - 400 K/uL   US Ob Comp Less 14 Wks  Result Date: 10/19/2017 CLINICAL DATA:  Abdominal pain for 3 weeks. Quantitative beta HCG is pending. By LMP of 09/16/2017, patient is 4 weeks 5 days. EDC by LMP is 06/23/2018. EXAM: OBSTETRIC <14 WK [place] AND TRANSVAGINAL OB US TECHNIQUE: Both transabdominal and transvaginal ultrasound examinations were performed for complete evaluation of the gestation as well as the maternal uterus, adnexal regions, and pelvic cul-de-sac. Transvaginal technique was performed to assess early pregnancy. COMPARISON:  01/08/2016 pelvic ultrasound FINDINGS: Intrauterine gestational sac: Single Yolk sac:  Not Visualized. Embryo:  Not Visualized. Cardiac Activity: Not Visualized. Heart Rate: Absent bpm MSD: 4.4 mm   5 w   1 d Subchorionic hemorrhage:  None visualized. Maternal uterus/adnexae: Normal in appearance. No free pelvic fluid. IMPRESSION: Intrauterine gestational sac is present. Embryo and yolk sac are not yet visualized. Follow-up ultrasound is recommended in 14 or more days to document presence of fetal pole and for dating purposes. Electronically Signed   By: Nolon Nations M.D.   On: 10/19/2017 13:31   US Ob  Transvaginal  Result Date: 10/19/2017 CLINICAL DATA:  Abdominal pain for 3 weeks. Quantitative beta HCG is pending. By LMP of 09/16/2017, patient is 4 weeks 5 days. EDC by LMP is 06/23/2018. EXAM: OBSTETRIC <14 WK [place] AND TRANSVAGINAL OB US TECHNIQUE: Both transabdominal and transvaginal ultrasound examinations were performed for complete evaluation of the gestation as well as the maternal uterus, adnexal regions, and pelvic cul-de-sac. Transvaginal technique was performed to assess early pregnancy. COMPARISON:  01/08/2016 pelvic ultrasound FINDINGS: Intrauterine gestational sac: Single Yolk sac:  Not Visualized. Embryo:  Not Visualized. Cardiac Activity: Not Visualized. Heart Rate: Absent bpm MSD: 4.4 mm   5 w   1 d Subchorionic hemorrhage:  None visualized. Maternal uterus/adnexae: Normal in appearance. No free pelvic fluid. IMPRESSION: Intrauterine gestational sac is present. Embryo and yolk sac are not yet visualized. Follow-up ultrasound is recommended in 14 or more days to document presence of fetal pole and for dating purposes. Electronically Signed   By: Nolon Nations M.D.   On: 10/19/2017 13:31    MAU Course  Procedures None  MDM +UPT UA, wet prep, GC/chlamydia, CBC, quant hCG, HIV, RPR and [place] today to rule out ectopic pregnancy  Assessment and Plan  A: Pregnancy of unknown location Abdominal pain in pregnancy, first trimester  Nausea and vomiting in pregnancy prior to [redacted] weeks gestation   P: Discharge home Rx for Zofran ODT sent to patient's pharmacy at her request. Risk vs Benefits of Zofran use in first trimester discussed, patient states this is the only anti-emetic that works for her and would like Rx today. Tylenol PRN for pain  Ectopic precautions discussed Patient advised to follow-up with [facility] on Tuesday at 11:00 am for repeat hCG.  Patient may return to MAU as needed or if her condition were to change or worsen  Kerry Hough, PA-C 10/19/2017, 1:42 PM

## 2017-10-19 NOTE — MAU Note (Signed)
RN to collect wet prep/GC. Patient declined. States was just tested at [facility]. Will inform CNM of pt request.

## 2017-10-19 NOTE — Discharge Instructions (Signed)
Eating Plan for Nausea and Vomiting in Pregnancy Hyperemesis gravidarum is a severe form of morning sickness. Because this condition causes severe nausea and vomiting, it can lead to dehydration, malnutrition, and weight loss. One way to lessen the symptoms of nausea and vomiting is to follow the eating plan for hyperemesis gravidarum. It is often used along with prescribed medicines to control your symptoms. What can I do to relieve my symptoms? Listen to your body. Everyone is different and has different preferences. Find what works best for you. Take any of the following actions that are helpful to you:  Eat and drink slowly.  Eat 5-6 small meals daily instead of 3 large meals.  Eat crackers before you get out of bed in the morning.  Try having a snack in the middle of the night.  Starchy foods are usually tolerated well. Examples include cereal, toast, bread, potatoes, pasta, rice, and pretzels.  Ginger may help with nausea. Add  tsp ground ginger to hot tea or choose ginger tea.  Try drinking 100% fruit juice or an electrolyte drink. An electrolyte drink contains sodium, potassium, and chloride.  Continue to take your prenatal vitamins as told by your health care provider. If you are having trouble taking your prenatal vitamins, talk with your health care provider about different options.  Include at least 1 serving of protein with your meals and snacks. Protein options include meats or poultry, beans, nuts, eggs, and yogurt. Try eating a protein-rich snack before bed. Examples of these snacks include cheese and crackers or half of a peanut butter or [place] sandwich.  Consider eliminating foods that trigger your symptoms. These may include spicy foods, coffee, high-fat foods, very sweet foods, and acidic foods.  Try meals that have more protein combined with bland, salty, lower-fat, and dry foods, such as nuts, seeds, pretzels, crackers, and cereal.  Talk with your healthcare  provider about starting a supplement of vitamin B6.  Have fluids that are cold, clear, and carbonated or sour. Examples include lemonade, ginger ale, lemon-lime soda, ice water, and sparkling water.  Try lemon or mint tea.  Try brushing your teeth or using a mouth rinse after meals.  What should I avoid to reduce my symptoms? Avoiding some of the following things may help reduce your symptoms.  Foods with strong smells. Try eating meals in well-ventilated areas that are free of odors.  Drinking water or other beverages with meals. Try not to drink anything during the 30 minutes before and after your meals.  Drinking more than 1 cup of fluid at a time. Sometimes using a straw helps.  Fried or high-fat foods, such as butter and cream sauces.  Spicy foods.  Skipping meals as best as you can. Nausea can be more intense on an empty stomach. If you cannot tolerate food at that time, do not force it. Try sucking on ice chips or other frozen items, and make up for missed calories later.  Lying down within 2 hours after eating.  Environmental triggers. These may include smoky rooms, closed spaces, rooms with strong smells, warm or humid places, overly loud and noisy rooms, and rooms with motion or flickering lights.  Quick and sudden changes in your movement.  This information is not intended to replace advice given to you by your health care provider. Make sure you discuss any questions you have with your health care provider. Document Released: 12/30/2006 Document Revised: 11/01/2015 Document Reviewed: 10/03/2015 Elsevier Interactive Patient Education  2018 [employer]  Trimester of Pregnancy The first trimester of pregnancy is from week 1 until the end of week 13 (months 1 through 3). During this time, your baby will begin to develop inside you. At 6-8 weeks, the eyes and face are formed, and the heartbeat can be seen on ultrasound. At the end of 12 weeks, all the baby's organs are  formed. Prenatal care is all the medical care you receive before the birth of your baby. Make sure you get good prenatal care and follow all of your doctor's instructions. Follow these instructions at home: Medicines  Take over-the-counter and prescription medicines only as told by your doctor. Some medicines are safe and some medicines are not safe during pregnancy.  Take a prenatal vitamin that contains at least 600 micrograms (mcg) of folic acid.  If you have trouble pooping (constipation), take medicine that will make your stool soft (stool softener) if your doctor approves. Eating and drinking  Eat regular, healthy meals.  Your doctor will tell you the amount of weight gain that is right for you.  Avoid raw meat and uncooked cheese.  If you feel sick to your stomach (nauseous) or throw up (vomit): ? Eat 4 or 5 small meals a day instead of 3 large meals. ? Try eating a few soda crackers. ? Drink liquids between meals instead of during meals.  To prevent constipation: ? Eat foods that are high in fiber, like fresh fruits and vegetables, whole grains, and beans. ? Drink enough fluids to keep your pee (urine) clear or pale yellow. Activity  Exercise only as told by your doctor. Stop exercising if you have cramps or pain in your lower belly (abdomen) or low back.  Do not exercise if it is too hot, too humid, or if you are in a place of great height (high altitude).  Try to avoid standing for long periods of time. Move your legs often if you must stand in one place for a long time.  Avoid heavy lifting.  Wear low-heeled shoes. Sit and stand up straight.  You can have sex unless your doctor tells you not to. Relieving pain and discomfort  Wear a good support bra if your breasts are sore.  Take warm water baths (sitz baths) to soothe pain or discomfort caused by hemorrhoids. Use hemorrhoid cream if your doctor says it is okay.  Rest with your legs raised if you have leg  cramps or low back pain.  If you have puffy, bulging veins (varicose veins) in your legs: ? Wear support hose or compression stockings as told by your doctor. ? Raise (elevate) your feet for 15 minutes, 3-4 times a day. ? Limit salt in your food. Prenatal care  Schedule your prenatal visits by the twelfth week of pregnancy.  Write down your questions. Take them to your prenatal visits.  Keep all your prenatal visits as told by your doctor. This is important. Safety  Wear your seat belt at all times when driving.  Make a list of emergency phone numbers. The list should include numbers for family, friends, the hospital, and police and fire departments. General instructions  Ask your doctor for a referral to a local prenatal class. Begin classes no later than at the start of month 6 of your pregnancy.  Ask for help if you need counseling or if you need help with nutrition. Your doctor can give you advice or tell you where to go for help.  Do not use hot tubs, steam rooms, or  saunas.  Do not douche or use tampons or scented sanitary pads.  Do not cross your legs for long periods of time.  Avoid all herbs and alcohol. Avoid drugs that are not approved by your doctor.  Do not use any tobacco products, including cigarettes, chewing tobacco, and electronic cigarettes. If you need help quitting, ask your doctor. You may get counseling or other support to help you quit.  Avoid cat litter boxes and soil used by cats. These carry germs that can cause birth defects in the baby and can cause a loss of your baby (miscarriage) or stillbirth.  Visit your dentist. At home, brush your teeth with a soft toothbrush. Be gentle when you floss. Contact a doctor if:  You are dizzy.  You have mild cramps or pressure in your lower belly.  You have a nagging pain in your belly area.  You continue to feel sick to your stomach, you throw up, or you have watery poop (diarrhea).  You have a bad  smelling fluid coming from your vagina.  You have pain when you pee (urinate).  You have increased puffiness (swelling) in your face, hands, legs, or ankles. Get help right away if:  You have a fever.  You are leaking fluid from your vagina.  You have spotting or bleeding from your vagina.  You have very bad belly cramping or pain.  You gain or lose weight rapidly.  You throw up blood. It may look like coffee grounds.  You are around people who have [place] measles, fifth disease, or chickenpox.  You have a very bad headache.  You have shortness of breath.  You have any kind of trauma, such as from a fall or a car accident. Summary  The first trimester of pregnancy is from week 1 until the end of week 13 (months 1 through 3).  To take care of yourself and your unborn baby, you will need to eat healthy meals, take medicines only if your doctor tells you to do so, and do activities that are safe for you and your baby.  Keep all follow-up visits as told by your doctor. This is important as your doctor will have to ensure that your baby is healthy and growing well. This information is not intended to replace advice given to you by your health care provider. Make sure you discuss any questions you have with your health care provider. Document Released: 08/21/2007 Document Revised: 03/12/2016 Document Reviewed: 03/12/2016 Elsevier Interactive Patient Education  2017 [employer].

## 2017-10-20 ENCOUNTER — Inpatient Hospital Stay: Admission: RE | Admit: 2017-10-20 | Payer: Medicaid Other | Source: Ambulatory Visit

## 2017-10-20 LAB — HIV ANTIBODY (ROUTINE TESTING W REFLEX): HIV Screen 4th Generation wRfx: NONREACTIVE

## 2017-10-21 ENCOUNTER — Ambulatory Visit (INDEPENDENT_AMBULATORY_CARE_PROVIDER_SITE_OTHER): Payer: Medicaid Other | Admitting: General Practice

## 2017-10-21 DIAGNOSIS — O283 Abnormal ultrasonic finding on antenatal screening of mother: Secondary | ICD-10-CM

## 2017-10-21 DIAGNOSIS — O3680X0 Pregnancy with inconclusive fetal viability, not applicable or unspecified: Secondary | ICD-10-CM

## 2017-10-21 LAB — HCG, QUANTITATIVE, PREGNANCY: hCG, Beta Chain, Quant, S: 6514 m[IU]/mL — ABNORMAL HIGH (ref <5)

## 2017-10-21 NOTE — Progress Notes (Signed)
Patient presents to office today for stat bhcg. Patient denies pain or bleeding. Discussed with patient we are monitoring your beta hcg levels today & asked she wait in lobby for results/updated plan of care. Patient verbalized understanding & had no questions at this time.  Reviewed results with Dr Roselie Awkward who finds appropriate rise in bhcg levels- patient should have follow up ultrasound in 10-14 days. Scheduled 8/20 @ 10am.  Informed patient of results, ultrasound appt, & ectopic precautions reviewed. Patient verbalized understanding to all & had no questions.

## 2017-10-25 ENCOUNTER — Inpatient Hospital Stay (HOSPITAL_COMMUNITY)
Admission: AD | Admit: 2017-10-25 | Discharge: 2017-10-25 | Disposition: A | Discharge status: Home / Self Care | Payer: Medicaid Other | Attending: Family Medicine | Admitting: Family Medicine

## 2017-10-25 ENCOUNTER — Encounter (HOSPITAL_COMMUNITY): Payer: Self-pay | Admitting: *Deleted

## 2017-10-25 DIAGNOSIS — Z3A01 Less than 8 weeks gestation of pregnancy: Secondary | ICD-10-CM | POA: Insufficient documentation

## 2017-10-25 DIAGNOSIS — O9989 Other specified diseases and conditions complicating pregnancy, childbirth and the puerperium: Secondary | ICD-10-CM

## 2017-10-25 DIAGNOSIS — O26891 Other specified pregnancy related conditions, first trimester: Secondary | ICD-10-CM | POA: Diagnosis not present

## 2017-10-25 DIAGNOSIS — Z87891 Personal history of nicotine dependence: Secondary | ICD-10-CM | POA: Diagnosis not present

## 2017-10-25 DIAGNOSIS — Z79899 Other long term (current) drug therapy: Secondary | ICD-10-CM | POA: Insufficient documentation

## 2017-10-25 DIAGNOSIS — Z885 Allergy status to narcotic agent status: Secondary | ICD-10-CM | POA: Diagnosis not present

## 2017-10-25 DIAGNOSIS — M549 Dorsalgia, unspecified: Secondary | ICD-10-CM | POA: Diagnosis not present

## 2017-10-25 DIAGNOSIS — R1084 Generalized abdominal pain: Secondary | ICD-10-CM | POA: Diagnosis not present

## 2017-10-25 DIAGNOSIS — Z880 Allergy status to penicillin: Secondary | ICD-10-CM | POA: Insufficient documentation

## 2017-10-25 DIAGNOSIS — Z9049 Acquired absence of other specified parts of digestive tract: Secondary | ICD-10-CM | POA: Insufficient documentation

## 2017-10-25 HISTORY — DX: Other specified bacterial intestinal infections: A04.8

## 2017-10-25 LAB — URINALYSIS, ROUTINE W REFLEX MICROSCOPIC
Bilirubin Urine: NEGATIVE
Glucose, UA: NEGATIVE mg/dL
Hgb urine dipstick: NEGATIVE
Ketones, ur: NEGATIVE mg/dL
Leukocytes, UA: NEGATIVE
Nitrite: NEGATIVE
Protein, ur: NEGATIVE mg/dL
Specific Gravity, Urine: 1.02 (ref 1.005–1.030)
pH: 6 (ref 5.0–8.0)

## 2017-10-25 MED ORDER — PROMETHAZINE HCL 25 MG RE SUPP: 25.0000 mg | Freq: Every day | RECTAL | 0 refills | Status: DC

## 2017-10-25 NOTE — Discharge Instructions (Signed)
Helicobacter Pylori Infection  Helicobacter pylori infection is an infection in the stomach that is caused by the Helicobacter pylori (H. pylori) bacteria. This type of bacteria often lives in the lining of the stomach. The infection can cause ulcers and irritation (gastritis) in some people. It is the most common cause of ulcers in the stomach (gastric ulcer) and in the upper part of the intestine (duodenal ulcer). Having this infection may also increase the risk of stomach cancer and a type of white blood cell cancer (lymphoma) that affects the stomach.  What are the causes?  H. pylori is a type of bacteria that is often found in the stomachs of healthy people. The bacteria may be passed from person to person through contact with stool or saliva. It is not known why some people develop ulcers, gastritis, or cancer from the infection.  What increases the risk?  This condition is more likely to develop in people who:  · Have family members with the infection.  · Live with many other people, such as in a dormitory.  · Are of African, Hispanic, or Asian descent.     What are the signs or symptoms?  Most people with this infection do not have symptoms. If you do have symptoms, they may include:  · Heartburn.  · Stomach pain.  · Nausea.  · Vomiting.  · Blood-tinged vomit.  · Loss of appetite.  · Bad breath.     How is this diagnosed?  This condition may be diagnosed based on your symptoms, a physical exam, and various tests. Tests may include:  · Blood tests or stool tests to check for the proteins (antibodies) that your body may produce in response to the bacteria. These tests are the best way to confirm the diagnosis.  · A breath test to check for the type of gas that the H. pylori bacteria release after breaking down a substance called urea. For the test, you are asked to drink urea. This test is often done after treatment in order to find out if the treatment worked.  · A procedure in which a thin, flexible tube  with a tiny camera at the end is placed into your stomach and upper intestine (upper endoscopy). Your health care provider may also take tissue samples (biopsy) to test for H. pylori and cancer.     How is this treated?  Treatment for this condition usually involves taking a combination of medicines (triple therapy) for a couple of weeks. Triple therapy includes one medicine to reduce the acid in your stomach and two types of antibiotic medicines. Many drug combinations have been approved for treatment. Treatment usually kills the H. pylori and reduces your risk of cancer. You may need to be tested for H. pylori again after treatment. In some cases, the treatment may need to be repeated.  Follow these instructions at home:  ·   · Take over-the-counter and prescription medicines only as told by your health care provider.  · Take your antibiotics as told by your health care provider. Do not stop taking the antibiotics even if you start to feel better.  · You can do all your usual activities and eat what you usually do.  · Take steps to prevent future infections:  ? Wash your hands often.  ? Make sure the food you eat has been properly prepared.  ? Drink water only from clean sources.  · Keep all follow-up visits as told by your health care provider. This is important.  Contact   a health care provider if:  · Your symptoms do not get better.  · Your symptoms return after treatment.  This information is not intended to replace advice given to you by your health care provider. Make sure you discuss any questions you have with your health care provider.  Document Released: 06/26/2015 Document Revised: 08/10/2015 Document Reviewed: 03/16/2014  Elsevier Interactive Patient Education © 2018 [employer] Inc[employer]   

## 2017-10-25 NOTE — MAU Provider Note (Signed)
Patient Emma Chambers is a 27 y.o. [phone] At [redacted]w[redacted]d here with complaints of on-going abdominal pain. She also mentions frequent vomiting. She denies bleeding, discharge, dysuria. She was seen in the MAU on 8-4 and diagnosed with a pregnancy of unknown location after she had complaints of abdominal pain. She had an appropriate rise in quant two days later. Her cultures were negative and she was not diagnosed with a UTI.   She has been smoking pot constantly but stopped when she found out she was pregnant. She used to smoke pot to help her eat (when she was non-pregnant).   She sees a GI doctor and was diagnosed with H.Pylori a month ago. She was given antibiotics for that but she did not complete her treatment.  She has a history of abdominal pain and has been seen by a specialist. 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    History     CSN: [id number]  Arrival date and time: 10/25/17 9562   None     Chief Complaint  Patient presents with  . Abdominal Pain  . Back Pain   Abdominal Pain  The current episode started in the past 7 days. The problem occurs constantly. The problem has been gradually worsening. The pain is at a severity of 8/10 (it is a 10 at home when she is trying to go to sleep). The quality of the pain is cramping. The abdominal pain radiates to the back. Associated symptoms include nausea and vomiting. Pertinent negatives include no constipation or diarrhea. Treatments tried: heat.  Back Pain  Associated symptoms include abdominal pain.   Yesterday she ate stir fry (lo mein with vegetables), cereal for breakfast.  On Thursday she ate salad, corn and grilled wings.  She threw up 7-8 times yesterday.   OB History    Gravida  4   Para  2   Term  1   Preterm  1   AB  1   Living  2     SAB  0   TAB  0   Ectopic  1   Multiple  0   Live Births  2           Past Medical History:  Diagnosis Date  .  Anemia   . Gallstones   . H. pylori infection   . Headache(784.0)    chronic  . Hypertension    GHTN with last pg  . Kidney stones   . Ovarian cyst   . Pregnancy induced hypertension   . UTI (lower urinary tract infection)     Past Surgical History:  Procedure Laterality Date  . CHOLECYSTECTOMY N/A 07/27/2012   Procedure: LAPAROSCOPIC CHOLECYSTECTOMY WITH INTRAOPERATIVE CHOLANGIOGRAM;  Surgeon: Madilyn Hook, DO;  Location: [facility];  Service: General;  Laterality: N/A;  . DIAGNOSTIC LAPAROSCOPY WITH REMOVAL OF ECTOPIC PREGNANCY N/A 12/27/2014   Procedure: DIAGNOSTIC LAPAROSCOPY, Right Salpingectomy with removal ECTOPIC PREGNANCY;  Surgeon: [name], MD;  Location: [facility];  Service: Gynecology;  Laterality: N/A;  . LAPAROSCOPIC APPENDECTOMY N/A 12/09/2012   Procedure: APPENDECTOMY LAPAROSCOPIC;  Surgeon: Gwenyth Ober, MD;  Location: [facility];  Service: General;  Laterality: N/A;    Family History  Problem Relation Age of Onset  . Asthma Mother   . Hypertension Mother   . Parkinsonism Mother   . Asthma Brother   . Diabetes Maternal Grandmother   . Hypertension Maternal  Grandmother   . Stroke Maternal Grandmother   . Heart disease Maternal Grandmother   . Lupus Maternal Aunt   . Heart disease Maternal Grandfather   . Anesthesia problems Neg Hx   . Malignant hyperthermia Neg Hx   . Pseudochol deficiency Neg Hx   . Hypotension Neg Hx     Social History   Tobacco Use  . Smoking status: Former Smoker    Types: Cigarettes  . Smokeless tobacco: Never Used  . Tobacco comment: 2014  Substance Use Topics  . Alcohol use: Not Currently    Comment: social  . Drug use: No    Allergies:  Allergies  Allergen Reactions  . Amoxicillin Nausea And Vomiting and Other (See Comments)    Tics & twitches Has patient had a PCN reaction causing immediate rash, facial/tongue/throat swelling, SOB or lightheadedness with hypotension: Yes Has patient had a PCN reaction causing severe rash  involving mucus membranes or skin necrosis: No Has patient had a PCN reaction that required hospitalization No Has patient had a PCN reaction occurring within the last 10 years: Yes If all of the above answers are "NO", then may proceed with Cephalosporin use.  . Contrast Media [Iodinated Diagnostic Agents] Hives  . Dilaudid [Hydromorphone Hcl] Itching    Medications Prior to Admission  Medication Sig Dispense Refill Last Dose  . omeprazole (PRILOSEC) 20 MG capsule Take 20 mg by mouth 2 (two) times daily.  0 03/31/2017 at Unknown time  . ondansetron (ZOFRAN ODT) 8 MG disintegrating tablet Take 1 tablet (8 mg total) by mouth every 8 (eight) hours as needed for nausea or vomiting. 20 tablet 0     Review of Systems  Gastrointestinal: Positive for abdominal pain, nausea and vomiting. Negative for constipation and diarrhea.  Musculoskeletal: Positive for back pain.   Physical Exam   Blood pressure 110/65, pulse 86, temperature 98.8 F (37.1 C), temperature source Oral, weight 93.3 kg, last menstrual period 09/15/2017, SpO2 100 %.  Physical Exam  Constitutional: She appears well-developed.  HENT:  Head: Normocephalic.  Neck: Normal range of motion.  Cardiovascular: Normal rate.  Respiratory: Effort normal.  GI: Soft.  Musculoskeletal: Normal range of motion.  Neurological: She is alert.  Skin: Skin is warm and dry.  Psychiatric: She has a normal mood and affect.    MAU Course  Procedures  MDM -Reviewed with patient that she should take her H Pylori and pepcid; will also add Phenergan at night.  -explained to her that abdominal pain may be due to diet/vomiting/H.pylori. Patient plans to follow up at her GI doctor.  -no lab work or US done today.   Assessment and Plan   1. Generalized abdominal pain    2. Return to MAU if her condition worsens or changes; keep [place] appt on 8-20.   Mervyn Skeeters [place]ra 10/25/2017, 9:12 AM

## 2017-10-25 NOTE — MAU Note (Signed)
Patient presents with constant low back and lower abdominal pain for past two days.  Tried to take Tylenol with no relief.  Denies vaginal discharge or bleeding.  Scheduled for [place] 11/04/17.  Reports hx of H. Plylori.

## 2017-10-25 NOTE — Progress Notes (Signed)
Patient signed E signature on wrong patient profile.  Verbalized understanding of dc instructions and Rx.

## 2017-11-01 ENCOUNTER — Encounter (HOSPITAL_COMMUNITY): Payer: Self-pay

## 2017-11-01 ENCOUNTER — Other Ambulatory Visit: Payer: Self-pay

## 2017-11-01 ENCOUNTER — Inpatient Hospital Stay (HOSPITAL_COMMUNITY)
Admission: AD | Admit: 2017-11-01 | Discharge: 2017-11-01 | Disposition: A | Discharge status: Home / Self Care | Payer: Medicaid Other | Attending: Family Medicine | Admitting: Family Medicine

## 2017-11-01 DIAGNOSIS — O26891 Other specified pregnancy related conditions, first trimester: Secondary | ICD-10-CM | POA: Insufficient documentation

## 2017-11-01 DIAGNOSIS — Z880 Allergy status to penicillin: Secondary | ICD-10-CM | POA: Insufficient documentation

## 2017-11-01 DIAGNOSIS — R1084 Generalized abdominal pain: Secondary | ICD-10-CM | POA: Diagnosis not present

## 2017-11-01 DIAGNOSIS — M549 Dorsalgia, unspecified: Secondary | ICD-10-CM | POA: Diagnosis not present

## 2017-11-01 DIAGNOSIS — Z87891 Personal history of nicotine dependence: Secondary | ICD-10-CM | POA: Diagnosis not present

## 2017-11-01 DIAGNOSIS — Z8619 Personal history of other infectious and parasitic diseases: Secondary | ICD-10-CM

## 2017-11-01 DIAGNOSIS — O9989 Other specified diseases and conditions complicating pregnancy, childbirth and the puerperium: Secondary | ICD-10-CM | POA: Diagnosis not present

## 2017-11-01 DIAGNOSIS — Z3A01 Less than 8 weeks gestation of pregnancy: Secondary | ICD-10-CM | POA: Diagnosis not present

## 2017-11-01 DIAGNOSIS — Z885 Allergy status to narcotic agent status: Secondary | ICD-10-CM | POA: Diagnosis not present

## 2017-11-01 LAB — URINALYSIS, ROUTINE W REFLEX MICROSCOPIC
Bacteria, UA: NONE SEEN
Bilirubin Urine: NEGATIVE
Glucose, UA: NEGATIVE mg/dL
Hgb urine dipstick: NEGATIVE
Ketones, ur: NEGATIVE mg/dL
Leukocytes, UA: NEGATIVE
Nitrite: NEGATIVE
Protein, ur: 30 mg/dL — AB
Specific Gravity, Urine: 1.025 (ref 1.005–1.030)
pH: 9 — ABNORMAL HIGH (ref 5.0–8.0)

## 2017-11-01 MED ORDER — ONDANSETRON 8 MG PO TBDP: 8.0000 mg | ORAL_TABLET | Freq: Three times a day (TID) | ORAL | 0 refills | Status: DC | PRN

## 2017-11-01 MED ORDER — PROMETHAZINE HCL 25 MG PO TABS
25.0000 mg | ORAL_TABLET | Freq: Once | ORAL | Status: AC
  Administered 2017-11-01: 25 mg via ORAL
  Filled 2017-11-01: qty 1

## 2017-11-01 MED ORDER — METOCLOPRAMIDE HCL 10 MG PO TABS: 10.0000 mg | ORAL_TABLET | Freq: Three times a day (TID) | ORAL | 0 refills | Status: DC

## 2017-11-01 NOTE — Discharge Instructions (Signed)
You were seen for abdominal pain and back pain. You can continue to take Tylenol for your back pain, but you should also try some exercises and warm heat to help.   You should follow-up with your GI doctor given your history of H. Pylori, so they can make sure the infection is gone. Make sure you are having regular bowel movements. Try to stay hydrated with water.   You can take the nausea medication by mouth or use a tablet in your bottom. We sent a new medication to your pharmacy called Reglan which can help with your nausea and vomiting. You should take it before meals until your symptoms feel better.   Back Exercises The following exercises strengthen the muscles that help to support the back. They also help to keep the lower back flexible. Doing these exercises can help to prevent back pain or lessen existing pain. If you have back pain or discomfort, try doing these exercises 2-3 times each day or as told by your health care provider. When the pain goes away, do them once each day, but increase the number of times that you repeat the steps for each exercise (do more repetitions). If you do not have back pain or discomfort, do these exercises once each day or as told by your health care provider. Exercises Single Knee to Chest  Repeat these steps 3-5 times for each leg: 1. Lie on your back on a firm bed or the floor with your legs extended. 2. Bring one knee to your chest. Your other leg should stay extended and in contact with the floor. 3. Hold your knee in place by grabbing your knee or thigh. 4. Pull on your knee until you feel a gentle stretch in your lower back. 5. Hold the stretch for 10-30 seconds. 6. Slowly release and straighten your leg.  Pelvic Tilt  Repeat these steps 5-10 times: 1. Lie on your back on a firm bed or the floor with your legs extended. 2. Bend your knees so they are pointing toward the ceiling and your feet are flat on the floor. 3. Tighten your lower  abdominal muscles to press your lower back against the floor. This motion will tilt your pelvis so your tailbone points up toward the ceiling instead of pointing to your feet or the floor. 4. With gentle tension and even breathing, hold this position for 5-10 seconds.  Cat-Cow  Repeat these steps until your lower back becomes more flexible: 1. Get into a hands-and-knees position on a firm surface. Keep your hands under your shoulders, and keep your knees under your hips. You may place padding under your knees for comfort. 2. Let your head hang down, and point your tailbone toward the floor so your lower back becomes rounded like the back of a cat. 3. Hold this position for 5 seconds. 4. Slowly lift your head and point your tailbone up toward the ceiling so your back forms a sagging arch like the back of a cow. 5. Hold this position for 5 seconds.  Press-Ups  Repeat these steps 5-10 times: 1. Lie on your abdomen (face-down) on the floor. 2. Place your palms near your head, about shoulder-width apart. 3. While you keep your back as relaxed as possible and keep your hips on the floor, slowly straighten your arms to raise the top half of your body and lift your shoulders. Do not use your back muscles to raise your upper torso. You may adjust the placement of your hands  to make yourself more comfortable. 4. Hold this position for 5 seconds while you keep your back relaxed. 5. Slowly return to lying flat on the floor.  Bridges  Repeat these steps 10 times: 1. Lie on your back on a firm surface. 2. Bend your knees so they are pointing toward the ceiling and your feet are flat on the floor. 3. Tighten your buttocks muscles and lift your buttocks off of the floor until your waist is at almost the same height as your knees. You should feel the muscles working in your buttocks and the back of your thighs. If you do not feel these muscles, slide your feet 1-2 inches farther away from your  buttocks. 4. Hold this position for 3-5 seconds. 5. Slowly lower your hips to the starting position, and allow your buttocks muscles to relax completely.  If this exercise is too easy, try doing it with your arms crossed over your chest. Abdominal Crunches  Repeat these steps 5-10 times: 1. Lie on your back on a firm bed or the floor with your legs extended. 2. Bend your knees so they are pointing toward the ceiling and your feet are flat on the floor. 3. Cross your arms over your chest. 4. Tip your chin slightly toward your chest without bending your neck. 5. Tighten your abdominal muscles and slowly raise your trunk (torso) high enough to lift your shoulder blades a tiny bit off of the floor. Avoid raising your torso higher than that, because it can put too much stress on your low back and it does not help to strengthen your abdominal muscles. 6. Slowly return to your starting position.  Back Lifts Repeat these steps 5-10 times: 1. Lie on your abdomen (face-down) with your arms at your sides, and rest your forehead on the floor. 2. Tighten the muscles in your legs and your buttocks. 3. Slowly lift your chest off of the floor while you keep your hips pressed to the floor. Keep the back of your head in line with the curve in your back. Your eyes should be looking at the floor. 4. Hold this position for 3-5 seconds. 5. Slowly return to your starting position.  Contact a health care provider if:  Your back pain or discomfort gets much worse when you do an exercise.  Your back pain or discomfort does not lessen within 2 hours after you exercise. If you have any of these problems, stop doing these exercises right away. Do not do them again unless your health care provider says that you can. Get help right away if:  You develop sudden, severe back pain. If this happens, stop doing the exercises right away. Do not do them again unless your health care provider says that you can. This  information is not intended to replace advice given to you by your health care provider. Make sure you discuss any questions you have with your health care provider. Document Released: 04/11/2004 Document Revised: 07/12/2015 Document Reviewed: 04/28/2014 Elsevier Interactive Patient Education  2017 [employer].

## 2017-11-01 NOTE — MAU Provider Note (Signed)
History    CSN: [id number] Arrival date and time: 11/01/17 1018 First Provider Initiated Contact with Patient 11/01/17 1134     Chief Complaint  Patient presents with  . Abdominal Pain  . Back Pain   HPI Emma Chambers is a 27yo [id number] at [redacted]w[redacted]d who presents for generalized abdominal pain, back pain. She has been seen for the same in the MAU twice in the last two weeks. She states she came in today because the abdominal pain was worse, and she was worried about an ectopic. She has a history of ectopic pregnancy. She also has a recent history of H. Pylori. She states she finished the course of treatment but hasn't followed up with her GI doctor. She wasn't sure if she was supposed to or not. She states she wasn't able to sleep well because of the pain.   Past Medical History:  Diagnosis Date  . Anemia   . Gallstones   . H. pylori infection   . Headache(784.0)    chronic  . Hypertension    GHTN with last pg  . Kidney stones   . Ovarian cyst   . Pregnancy induced hypertension   . UTI (lower urinary tract infection)     Past Surgical History:  Procedure Laterality Date  . CHOLECYSTECTOMY N/A 07/27/2012   Procedure: LAPAROSCOPIC CHOLECYSTECTOMY WITH INTRAOPERATIVE CHOLANGIOGRAM;  Surgeon: Madilyn Hook, DO;  Location: [facility];  Service: General;  Laterality: N/A;  . DIAGNOSTIC LAPAROSCOPY WITH REMOVAL OF ECTOPIC PREGNANCY N/A 12/27/2014   Procedure: DIAGNOSTIC LAPAROSCOPY, Right Salpingectomy with removal ECTOPIC PREGNANCY;  Surgeon: [name], MD;  Location: [facility];  Service: Gynecology;  Laterality: N/A;  . LAPAROSCOPIC APPENDECTOMY N/A 12/09/2012   Procedure: APPENDECTOMY LAPAROSCOPIC;  Surgeon: Gwenyth Ober, MD;  Location: [facility];  Service: General;  Laterality: N/A;    Family History  Problem Relation Age of Onset  . Asthma Mother   . Hypertension Mother   . Parkinsonism Mother   . Asthma Brother   . Diabetes Maternal Grandmother   . Hypertension Maternal Grandmother   . Stroke  Maternal Grandmother   . Heart disease Maternal Grandmother   . Lupus Maternal Aunt   . Heart disease Maternal Grandfather   . Anesthesia problems Neg Hx   . Malignant hyperthermia Neg Hx   . Pseudochol deficiency Neg Hx   . Hypotension Neg Hx     Social History   Tobacco Use  . Smoking status: Former Smoker    Types: Cigarettes  . Smokeless tobacco: Never Used  . Tobacco comment: 2014  Substance Use Topics  . Alcohol use: Not Currently    Comment: social  . Drug use: No    Allergies:  Allergies  Allergen Reactions  . Amoxicillin Nausea And Vomiting and Other (See Comments)    Tics & twitches Has patient had a PCN reaction causing immediate rash, facial/tongue/throat swelling, SOB or lightheadedness with hypotension: Yes Has patient had a PCN reaction causing severe rash involving mucus membranes or skin necrosis: No Has patient had a PCN reaction that required hospitalization No Has patient had a PCN reaction occurring within the last 10 years: Yes If all of the above answers are "NO", then may proceed with Cephalosporin use.  . Contrast Media [Iodinated Diagnostic Agents] Hives  . Dilaudid [Hydromorphone Hcl] Itching    Medications Prior to Admission  Medication Sig Dispense Refill Last Dose  . omeprazole (PRILOSEC) 20 MG capsule Take 20 mg by mouth 2 (two) times daily.  0  03/31/2017 at Unknown time  . promethazine (PHENERGAN) 25 MG suppository Place 1 suppository (25 mg total) rectally at bedtime. 30 each 0   . [DISCONTINUED] ondansetron (ZOFRAN ODT) 8 MG disintegrating tablet Take 1 tablet (8 mg total) by mouth every 8 (eight) hours as needed for nausea or vomiting. 20 tablet 0    Review of Systems  Constitutional: Positive for appetite change. Negative for activity change and fever.  HENT: Negative for congestion.   Respiratory: Negative for shortness of breath.   Cardiovascular: Negative for chest pain.  Gastrointestinal: Positive for abdominal pain, diarrhea,  nausea and vomiting (7x per day). Negative for blood in stool and constipation.  Endocrine: Positive for cold intolerance and heat intolerance.  Genitourinary: Negative for difficulty urinating, vaginal bleeding, vaginal discharge and vaginal pain.  Musculoskeletal: Positive for back pain.  Neurological: Negative for dizziness and light-headedness.  Psychiatric/Behavioral: Positive for sleep disturbance.   Physical Exam   Blood pressure (!) 111/58, pulse 87, temperature 98.4 F (36.9 C), temperature source Oral, resp. rate 18, weight 92.5 kg, last menstrual period 09/15/2017, SpO2 100 %.  Physical Exam  Nursing note and vitals reviewed. Constitutional: She is oriented to person, place, and time. She appears well-developed and well-nourished. No distress.  Sleeping on stomach when entered room  HENT:  Head: Normocephalic and atraumatic.  Mouth/Throat: Oropharynx is clear and moist.  Eyes: Conjunctivae and EOM are normal. No scleral icterus.  Neck: Neck supple. No thyromegaly present.  Cardiovascular: Normal rate and intact distal pulses.  Respiratory: Effort normal. No respiratory distress.  GI: Soft. Bowel sounds are normal. There is no tenderness. There is no guarding.  Musculoskeletal: She exhibits no edema.  Lymphadenopathy:    She has no cervical adenopathy.  Neurological: She is alert and oriented to person, place, and time.  Skin: Skin is warm and dry. No rash noted.  Psychiatric: She has a normal mood and affect. Her behavior is normal.   MAU Course  Procedures  MDM -- U/S 10/19/17 - intrauterine gestational sac without embryo, yolk sac   -- near doubling of BhCG 3814 > 6514 within 48 hours -- U/A with SG 1.025 suggesting slightly dehydrated but not ketones  -- about 1kg from last visit a week ago   Assessment and [place] is a 27yo [id number] at [redacted]w[redacted]d who presents for generalized abdominal pain, back pain. She has been seen for this recently. Exam is reassuring, no  tenderness to palpation. Her nausea improved with one dose of po phenergan. I encouraged her to follow-up with her GI provider given reported recent history of H. Pylori (though from chart review appears that was in 2012). Will trial Reglan with prn Zofran ODTor phenergan PR. Encouraged her to keep scheduled follow-up ultrasound this week, given last U/S with intrauterine gestational sac but no embryo. Also sent message to admin team to schedule initial prenatal. Instructions reviewed with patient, she is in agreement with plan.   Glenice Bow, DO  11/01/2017, 12:26 PM

## 2017-11-01 NOTE — MAU Note (Signed)
Pt states she's having bad back and abdominal pain. 9/10. States she's having n/v and diarrhea for a couple of days now. Feels like the nausea medication isn't working.

## 2017-11-04 ENCOUNTER — Inpatient Hospital Stay (HOSPITAL_COMMUNITY)
Admission: AD | Admit: 2017-11-04 | Discharge: 2017-11-04 | Disposition: A | Discharge status: Home / Self Care | Payer: Medicaid Other | Source: Ambulatory Visit | Attending: Obstetrics and Gynecology | Admitting: Obstetrics and Gynecology

## 2017-11-04 ENCOUNTER — Encounter (HOSPITAL_COMMUNITY): Payer: Self-pay | Admitting: *Deleted

## 2017-11-04 ENCOUNTER — Inpatient Hospital Stay (HOSPITAL_COMMUNITY): Payer: Medicaid Other

## 2017-11-04 ENCOUNTER — Other Ambulatory Visit: Payer: Self-pay | Admitting: Medical

## 2017-11-04 ENCOUNTER — Ambulatory Visit (HOSPITAL_COMMUNITY)
Admission: RE | Admit: 2017-11-04 | Discharge: 2017-11-04 | Disposition: A | Discharge status: Home / Self Care | Payer: Medicaid Other | Source: Ambulatory Visit | Attending: Obstetrics & Gynecology | Admitting: Obstetrics & Gynecology

## 2017-11-04 DIAGNOSIS — O26851 Spotting complicating pregnancy, first trimester: Secondary | ICD-10-CM | POA: Insufficient documentation

## 2017-11-04 DIAGNOSIS — Z880 Allergy status to penicillin: Secondary | ICD-10-CM | POA: Diagnosis not present

## 2017-11-04 DIAGNOSIS — Z87891 Personal history of nicotine dependence: Secondary | ICD-10-CM | POA: Insufficient documentation

## 2017-11-04 DIAGNOSIS — O4690 Antepartum hemorrhage, unspecified, unspecified trimester: Secondary | ICD-10-CM | POA: Insufficient documentation

## 2017-11-04 DIAGNOSIS — R1032 Left lower quadrant pain: Secondary | ICD-10-CM | POA: Diagnosis present

## 2017-11-04 DIAGNOSIS — O283 Abnormal ultrasonic finding on antenatal screening of mother: Secondary | ICD-10-CM | POA: Diagnosis not present

## 2017-11-04 DIAGNOSIS — O210 Mild hyperemesis gravidarum: Secondary | ICD-10-CM | POA: Insufficient documentation

## 2017-11-04 DIAGNOSIS — O3680X0 Pregnancy with inconclusive fetal viability, not applicable or unspecified: Secondary | ICD-10-CM

## 2017-11-04 DIAGNOSIS — O209 Hemorrhage in early pregnancy, unspecified: Secondary | ICD-10-CM

## 2017-11-04 DIAGNOSIS — N930 Postcoital and contact bleeding: Secondary | ICD-10-CM | POA: Diagnosis not present

## 2017-11-04 DIAGNOSIS — O26891 Other specified pregnancy related conditions, first trimester: Secondary | ICD-10-CM | POA: Diagnosis not present

## 2017-11-04 DIAGNOSIS — Z3A01 Less than 8 weeks gestation of pregnancy: Secondary | ICD-10-CM | POA: Diagnosis not present

## 2017-11-04 DIAGNOSIS — R109 Unspecified abdominal pain: Secondary | ICD-10-CM | POA: Diagnosis not present

## 2017-11-04 LAB — URINALYSIS, ROUTINE W REFLEX MICROSCOPIC
Bilirubin Urine: NEGATIVE
Glucose, UA: NEGATIVE mg/dL
Hgb urine dipstick: NEGATIVE
Ketones, ur: NEGATIVE mg/dL
Leukocytes, UA: NEGATIVE
Nitrite: NEGATIVE
Protein, ur: NEGATIVE mg/dL
Specific Gravity, Urine: 1.02 (ref 1.005–1.030)
pH: 9 — ABNORMAL HIGH (ref 5.0–8.0)

## 2017-11-04 NOTE — Discharge Instructions (Signed)
First Trimester of Pregnancy The first trimester of pregnancy is from week 1 until the end of week 13 (months 1 through 3). During this time, your baby will begin to develop inside you. At 6-8 weeks, the eyes and face are formed, and the heartbeat can be seen on ultrasound. At the end of 12 weeks, all the baby's organs are formed. Prenatal care is all the medical care you receive before the birth of your baby. Make sure you get good prenatal care and follow all of your doctor's instructions. Follow these instructions at home: Medicines  Take over-the-counter and prescription medicines only as told by your doctor. Some medicines are safe and some medicines are not safe during pregnancy.  Take a prenatal vitamin that contains at least 600 micrograms (mcg) of folic acid.  If you have trouble pooping (constipation), take medicine that will make your stool soft (stool softener) if your doctor approves. Eating and drinking  Eat regular, healthy meals.  Your doctor will tell you the amount of weight gain that is right for you.  Avoid raw meat and uncooked cheese.  If you feel sick to your stomach (nauseous) or throw up (vomit): ? Eat 4 or 5 small meals a day instead of 3 large meals. ? Try eating a few soda crackers. ? Drink liquids between meals instead of during meals.  To prevent constipation: ? Eat foods that are high in fiber, like fresh fruits and vegetables, whole grains, and beans. ? Drink enough fluids to keep your pee (urine) clear or pale yellow. Activity  Exercise only as told by your doctor. Stop exercising if you have cramps or pain in your lower belly (abdomen) or low back.  Do not exercise if it is too hot, too humid, or if you are in a place of great height (high altitude).  Try to avoid standing for long periods of time. Move your legs often if you must stand in one place for a long time.  Avoid heavy lifting.  Wear low-heeled shoes. Sit and stand up straight.  You  can have sex unless your doctor tells you not to. Relieving pain and discomfort  Wear a good support bra if your breasts are sore.  Take warm water baths (sitz baths) to soothe pain or discomfort caused by hemorrhoids. Use hemorrhoid cream if your doctor says it is okay.  Rest with your legs raised if you have leg cramps or low back pain.  If you have puffy, bulging veins (varicose veins) in your legs: ? Wear support hose or compression stockings as told by your doctor. ? Raise (elevate) your feet for 15 minutes, 3-4 times a day. ? Limit salt in your food. Prenatal care  Schedule your prenatal visits by the twelfth week of pregnancy.  Write down your questions. Take them to your prenatal visits.  Keep all your prenatal visits as told by your doctor. This is important. Safety  Wear your seat belt at all times when driving.  Make a list of emergency phone numbers. The list should include numbers for family, friends, the hospital, and police and fire departments. General instructions  Ask your doctor for a referral to a local prenatal class. Begin classes no later than at the start of month 6 of your pregnancy.  Ask for help if you need counseling or if you need help with nutrition. Your doctor can give you advice or tell you where to go for help.  Do not use hot tubs, steam rooms, or  saunas.  Do not douche or use tampons or scented sanitary pads.  Do not cross your legs for long periods of time.  Avoid all herbs and alcohol. Avoid drugs that are not approved by your doctor.  Do not use any tobacco products, including cigarettes, chewing tobacco, and electronic cigarettes. If you need help quitting, ask your doctor. You may get counseling or other support to help you quit.  Avoid cat litter boxes and soil used by cats. These carry germs that can cause birth defects in the baby and can cause a loss of your baby (miscarriage) or stillbirth.  Visit your dentist. At home, brush  your teeth with a soft toothbrush. Be gentle when you floss. Contact a doctor if:  You are dizzy.  You have mild cramps or pressure in your lower belly.  You have a nagging pain in your belly area.  You continue to feel sick to your stomach, you throw up, or you have watery poop (diarrhea).  You have a bad smelling fluid coming from your vagina.  You have pain when you pee (urinate).  You have increased puffiness (swelling) in your face, hands, legs, or ankles. Get help right away if:  You have a fever.  You are leaking fluid from your vagina.  You have spotting or bleeding from your vagina.  You have very bad belly cramping or pain.  You gain or lose weight rapidly.  You throw up blood. It may look like coffee grounds.  You are around people who have [place] measles, fifth disease, or chickenpox.  You have a very bad headache.  You have shortness of breath.  You have any kind of trauma, such as from a fall or a car accident. Summary  The first trimester of pregnancy is from week 1 until the end of week 13 (months 1 through 3).  To take care of yourself and your unborn baby, you will need to eat healthy meals, take medicines only if your doctor tells you to do so, and do activities that are safe for you and your baby.  Keep all follow-up visits as told by your doctor. This is important as your doctor will have to ensure that your baby is healthy and growing well. This information is not intended to replace advice given to you by your health care provider. Make sure you discuss any questions you have with your health care provider. Document Released: 08/21/2007 Document Revised: 03/12/2016 Document Reviewed: 03/12/2016 Elsevier Interactive Patient Education  2017 [place]. Pelvic Rest Pelvic rest may be recommended if:  Your placenta is partially or completely covering the opening of your cervix (placenta previa).  There is bleeding between the wall of the  uterus and the amniotic sac in the first trimester of pregnancy (subchorionic hemorrhage).  You went into labor too early (preterm labor).  Based on your overall health and the health of your baby, your health care provider will decide if pelvic rest is right for you. How do I rest my pelvis? For as long as told by your health care provider:  Do not have sex, sexual stimulation, or an orgasm.  Do not use tampons. Do not douche. Do not put anything in your vagina.  Do not lift anything that is heavier than 10 lb (4.5 kg).  Avoid activities that take a lot of effort (are strenuous).  Avoid any activity in which your pelvic muscles could become strained.  When should I seek medical care? Seek medical care if you  have:  Cramping pain in your lower abdomen.  Vaginal discharge.  A low, dull backache.  Regular contractions.  Uterine tightening.  When should I seek immediate medical care? Seek immediate medical care if:  You have vaginal bleeding and you are pregnant.  This information is not intended to replace advice given to you by your health care provider. Make sure you discuss any questions you have with your health care provider. Document Released: 06/29/2010 Document Revised: 08/10/2015 Document Reviewed: 09/05/2014 Elsevier Interactive Patient Education  [employer].

## 2017-11-04 NOTE — MAU Provider Note (Signed)
History     CSN: [id number]  Arrival date and time: 11/04/17 0900   First Provider Initiated Contact with Patient 11/04/17 1004      Chief Complaint  Patient presents with  . Abdominal Pain  . Back Pain  . Vaginal Bleeding   HPI Emma Chambers is a 27 y.o. [id number] at [redacted]w[redacted]d who presents to MAU today with complaint of increased LLQ and back pain and new vaginal bleeding. The patient was seen ~ 2 weeks ago in MAU with abdominal and back pain. She was followed-up for hCG after 48 hours and had appropriate rise. Repeat US was scheduled for this morning, but patient decided to come here for evaluation due to new onset bleeding. She states light bleeding since 0400 today. She denies fever. She continues to have N/V as previously. She had intercourse last night.   OB History    Gravida  4   Para  2   Term  1   Preterm  1   AB  1   Living  2     SAB  0   TAB  0   Ectopic  1   Multiple  0   Live Births  2           Past Medical History:  Diagnosis Date  . Anemia   . Gallstones   . H. pylori infection   . Headache(784.0)    chronic  . Hypertension    GHTN with last pg  . Kidney stones   . Ovarian cyst   . Pregnancy induced hypertension   . UTI (lower urinary tract infection)     Past Surgical History:  Procedure Laterality Date  . CHOLECYSTECTOMY N/A 07/27/2012   Procedure: LAPAROSCOPIC CHOLECYSTECTOMY WITH INTRAOPERATIVE CHOLANGIOGRAM;  Surgeon: Madilyn Hook, DO;  Location: [facility];  Service: General;  Laterality: N/A;  . DIAGNOSTIC LAPAROSCOPY WITH REMOVAL OF ECTOPIC PREGNANCY N/A 12/27/2014   Procedure: DIAGNOSTIC LAPAROSCOPY, Right Salpingectomy with removal ECTOPIC PREGNANCY;  Surgeon: [name], MD;  Location: [facility];  Service: Gynecology;  Laterality: N/A;  . LAPAROSCOPIC APPENDECTOMY N/A 12/09/2012   Procedure: APPENDECTOMY LAPAROSCOPIC;  Surgeon: Gwenyth Ober, MD;  Location: [facility];  Service: General;  Laterality: N/A;    Family History   Problem Relation Age of Onset  . Asthma Mother   . Hypertension Mother   . Parkinsonism Mother   . Asthma Brother   . Diabetes Maternal Grandmother   . Hypertension Maternal Grandmother   . Stroke Maternal Grandmother   . Heart disease Maternal Grandmother   . Lupus Maternal Aunt   . Heart disease Maternal Grandfather   . Anesthesia problems Neg Hx   . Malignant hyperthermia Neg Hx   . Pseudochol deficiency Neg Hx   . Hypotension Neg Hx     Social History   Tobacco Use  . Smoking status: Former Smoker    Types: Cigarettes  . Smokeless tobacco: Never Used  . Tobacco comment: 2014  Substance Use Topics  . Alcohol use: Not Currently    Comment: social  . Drug use: No    Allergies:  Allergies  Allergen Reactions  . Amoxicillin Nausea And Vomiting and Other (See Comments)    Tics & twitches Has patient had a PCN reaction causing immediate rash, facial/tongue/throat swelling, SOB or lightheadedness with hypotension: Yes Has patient had a PCN reaction causing severe rash involving mucus membranes or skin necrosis: No Has patient had a PCN reaction that required hospitalization No  Has patient had a PCN reaction occurring within the last 10 years: Yes If all of the above answers are "NO", then may proceed with Cephalosporin use.  . Contrast Media [Iodinated Diagnostic Agents] Hives  . Dilaudid [Hydromorphone Hcl] Itching    Medications Prior to Admission  Medication Sig Dispense Refill Last Dose  . metoCLOPramide (REGLAN) 10 MG tablet Take 1 tablet (10 mg total) by mouth 3 (three) times daily with meals for 10 days. 30 tablet 0   . omeprazole (PRILOSEC) 20 MG capsule Take 20 mg by mouth 2 (two) times daily.  0 03/31/2017 at Unknown time  . ondansetron (ZOFRAN ODT) 8 MG disintegrating tablet Take 1 tablet (8 mg total) by mouth every 8 (eight) hours as needed for nausea or vomiting. 20 tablet 0   . promethazine (PHENERGAN) 25 MG suppository Place 1 suppository (25 mg total)  rectally at bedtime. 30 each 0     Review of Systems  Constitutional: Negative for fever.  Gastrointestinal: Positive for abdominal pain, nausea and vomiting. Negative for constipation and diarrhea.  Genitourinary: Positive for vaginal bleeding. Negative for dysuria, frequency, urgency and vaginal discharge.   Physical Exam   Blood pressure 123/65, pulse 91, temperature 98.9 F (37.2 C), temperature source Oral, resp. rate 16, height 5\' 2"  (1.575 m), weight 93.4 kg, last menstrual period 09/15/2017, SpO2 100 %.  Physical Exam  Nursing note and vitals reviewed. Constitutional: She is oriented to person, place, and time. She appears well-developed and well-nourished. No distress.  HENT:  Head: Normocephalic and atraumatic.  Cardiovascular: Normal rate.  Respiratory: Effort normal.  GI: Soft. She exhibits no distension and no mass. There is tenderness (mild LLQ abdominal tenderness to palpation). There is no rebound and no guarding.  Genitourinary: Uterus is not enlarged and not tender. Cervix exhibits no motion tenderness, no discharge and no friability. Right adnexum displays no mass and no tenderness. Left adnexum displays no mass and no tenderness. There is bleeding (scant, light brown) in the vagina. No vaginal discharge found.  Neurological: She is alert and oriented to person, place, and time.  Skin: Skin is warm and dry. No erythema.  Psychiatric: She has a normal mood and affect.  Dilation: Closed Effacement (%): Thick Cervical Position: Posterior Exam by:: Kerry Hough, PA-C   Results for orders placed or performed during the hospital encounter of 11/04/17 (from the past 24 hour(s))  Urinalysis, Routine w reflex microscopic     Status: Abnormal   Collection Time: 11/04/17  9:56 AM  Result Value Ref Range   Color, Urine YELLOW YELLOW   APPearance CLOUDY (A) CLEAR   Specific Gravity, Urine 1.020 1.005 - 1.030   pH 9.0 (H) 5.0 - 8.0   Glucose, UA NEGATIVE NEGATIVE mg/dL    Hgb urine dipstick NEGATIVE NEGATIVE   Bilirubin Urine NEGATIVE NEGATIVE   Ketones, ur NEGATIVE NEGATIVE mg/dL   Protein, ur NEGATIVE NEGATIVE mg/dL   Nitrite NEGATIVE NEGATIVE   Leukocytes, UA NEGATIVE NEGATIVE    US Ob Transvaginal  Result Date: 11/04/2017 CLINICAL DATA:  Patient with lower back and abdominal pain. EXAM: TRANSVAGINAL OB ULTRASOUND TECHNIQUE: Transvaginal ultrasound was performed for complete evaluation of the gestation as well as the maternal uterus, adnexal regions, and pelvic cul-de-sac. COMPARISON:  Pelvic ultrasound 10/19/2017 FINDINGS: Intrauterine gestational sac: Single Yolk sac:  Visualized. Embryo:  Visualized. Cardiac Activity: Visualized. Heart Rate: 123 bpm CRL:   10.1 mm   7 w 1 d  [place] EDC: 06/22/2018 Subchorionic hemorrhage:  Small Maternal uterus/adnexae: Normal right and left ovaries. IMPRESSION: Single live intrauterine gestation.  Small subchorionic hemorrhage. Electronically Signed   By: Lovey Newcomer M.D.   On: 11/04/2017 10:57    MAU Course  Procedures None  MDM US OB TV today - SIUP with normal FHR  Assessment and Plan  A: SIUP at [redacted]w[redacted]d Spotting in pregnancy, first trimester Nausea and vomiting in pregnancy prior to [redacted] weeks gestation   P: Discharge home Patient advised to continue previously prescribed anti-emetics Bleeding precautions and pelvic rest discussed Patient advised to follow-up with [facility] as planned to start prenatal care or sooner PRN Patient may return to MAU as needed or if her condition were to change or worsen   Kerry Hough, PA-C 11/04/2017, 11:12 AM

## 2017-11-04 NOTE — MAU Note (Signed)
Pt reports lower abd pain and back pain for weeks but getting worse today. Also reports bright red bleeding that started today.

## 2017-11-28 NOTE — BH Specialist Note (Signed)
Integrated Behavioral Health Initial Visit  MRN: [id number] Name: Emma Chambers  Number of [place] Clinician visits:: 1/6 Session Start time: 9:36  Session End time: 9:48 Total time: 15 minutes  Type of Service: [place] Interpretor:No. Interpretor Name and Language: n/a   Warm Hand Off Completed.       SUBJECTIVE: Emma Chambers is a 27 y.o. female accompanied by n/a Patient was referred by Marcille Buffy, CNM for Initial OB introduction to integrated behavioral health services . Patient reports the following symptoms/concerns: Pt states her primary concern today is mild sleep difficulty and morning sickness; pt plans to begin taking prenatal vitamins, and self-reports a history of depression and PTSD, with treatment at [facility] in the past.   Duration of problem: Current pregnancy; Severity of problem: mild  OBJECTIVE: Mood: Normal and Affect: Appropriate Risk of harm to self or others: No plan to harm self or others  LIFE CONTEXT: Family and Social: Pt has two children,( 6yo; 42yo) School/Work: Pt works full time Self-Care: Pt uses breaks at work for self care Life Changes: Current pregnancy   GOALS ADDRESSED: Patient will: 1. Reduce symptoms of: anxiety and depression 2. Increase knowledge and/or ability of: self-management skills  3. Demonstrate ability to: Increase healthy adjustment to current life circumstances  INTERVENTIONS: Interventions utilized: Psychoeducation and/or Health Education  Standardized Assessments completed: GAD-7 and PHQ 9  ASSESSMENT: Patient currently experiencing Supervision of other normal pregnancy, antepartum.   Patient may benefit from Initial OB introduction to integrated behavioral health services, and brief therapeutic intervention regarding coping with symptoms of anxiety and depression today .  PLAN: 1. Follow up with behavioral health clinician on : As  needed 2. Behavioral recommendations:  -Continue using work breaks for self care -Consider replacing TV with sleep app at bedtime for improved sleep -Begin taking prenatal vitamin daily, as recommended by medical provider -Read educational materials regarding coping with symptoms of depression and anxiety  3. Referral(s): [place] (In Clinic) 4. "From scale of 1-10, how likely are you to follow plan?": 10  Garlan Fair, LCSW  Depression screen [facility] 2/9 12/01/2017  Decreased Interest 1  Down, Depressed, Hopeless 2  PHQ - 2 Score 3  Altered sleeping 2  Tired, decreased energy 2  Change in appetite 2  Feeling bad or failure about yourself  1  Trouble concentrating 0  Moving slowly or fidgety/restless 0  Suicidal thoughts 0  PHQ-9 Score 10   GAD 7 : Generalized Anxiety Score 12/01/2017  Nervous, Anxious, on Edge 1  Control/stop worrying 1  Worry too much - different things 1  Trouble relaxing 1  Restless 1  Easily annoyed or irritable 1  Afraid - awful might happen 1  Total GAD 7 Score 7

## 2017-12-01 ENCOUNTER — Ambulatory Visit (INDEPENDENT_AMBULATORY_CARE_PROVIDER_SITE_OTHER): Payer: Medicaid Other | Admitting: Advanced Practice Midwife

## 2017-12-01 ENCOUNTER — Encounter: Payer: Self-pay | Admitting: Advanced Practice Midwife

## 2017-12-01 ENCOUNTER — Ambulatory Visit: Payer: Self-pay | Admitting: Clinical

## 2017-12-01 DIAGNOSIS — Z8659 Personal history of other mental and behavioral disorders: Secondary | ICD-10-CM

## 2017-12-01 DIAGNOSIS — Z3480 Encounter for supervision of other normal pregnancy, unspecified trimester: Secondary | ICD-10-CM

## 2017-12-01 DIAGNOSIS — Z3481 Encounter for supervision of other normal pregnancy, first trimester: Secondary | ICD-10-CM

## 2017-12-01 DIAGNOSIS — Z8759 Personal history of other complications of pregnancy, childbirth and the puerperium: Secondary | ICD-10-CM

## 2017-12-01 HISTORY — DX: Personal history of other complications of pregnancy, childbirth and the puerperium: Z87.59

## 2017-12-01 LAB — POCT URINALYSIS DIP (DEVICE)
Bilirubin Urine: NEGATIVE
Glucose, UA: NEGATIVE mg/dL
Leukocytes, UA: NEGATIVE
Nitrite: NEGATIVE
Protein, ur: 30 mg/dL — AB
Specific Gravity, Urine: >=1.03 (ref 1.005–1.030)
Urobilinogen, UA: 0.2 mg/dL (ref 0.0–1.0)
pH: 6 (ref 5.0–8.0)

## 2017-12-01 MED ORDER — FAMOTIDINE 20 MG PO TABS: 20.0000 mg | ORAL_TABLET | Freq: Two times a day (BID) | ORAL | 11 refills | Status: DC

## 2017-12-01 MED ORDER — PROMETHAZINE HCL 25 MG PO TABS: 12.5000 mg | ORAL_TABLET | Freq: Four times a day (QID) | ORAL | 0 refills | Status: DC | PRN

## 2017-12-01 MED ORDER — ASPIRIN EC 81 MG PO TBEC: 81.0000 mg | DELAYED_RELEASE_TABLET | Freq: Every day | ORAL | 11 refills | Status: DC

## 2017-12-01 MED ORDER — PRENATAL PLUS 27-1 MG PO TABS: 1.0000 | ORAL_TABLET | Freq: Every day | ORAL | 11 refills | Status: DC

## 2017-12-01 NOTE — Patient Instructions (Signed)

## 2017-12-01 NOTE — Progress Notes (Signed)
Send prenatal Vit  to pharmacy, something for nausea.

## 2017-12-01 NOTE — Progress Notes (Signed)
Subjective:   Emma Chambers is a 27 y.o. [id number] at 41w0dby LMP, early ultrasound being seen today for her first obstetrical visit.  Her obstetrical history is significant for GHTN. Patient does not intend to breast feed. Pregnancy history fully reviewed.  Patient reports fatigue, heartburn and nausea.  HISTORY: OB History  Gravida Para Term Preterm AB Living  4 2 1 1 1 2   SAB TAB Ectopic Multiple Live Births  0 0 1 0 2    # Outcome Date GA Lbr Len/2nd Weight Sex Delivery Anes PTL Lv  4 Current           3 Preterm 06/29/12 361w4d0:57 / 00:16 5 lb 10.6 oz (2.568 kg) M Vag-Spont EPI  LIV     Name: Lange,BOY [place]     Apgar1: 9  Apgar5: 9  2 Term 05/20/11 4024w3d:02 / 00:26 8 lb 1.6 oz (3.675 kg) F Vag-Spont EPI  LIV     Birth Comments: none     Name: Malesky,GIRL Teri     Apgar1: 8  Apgar5: 9  1 Ectopic             Last pap smear was done 2018 and was normal per patient. Will sign ROI for copy  Past Medical History:  Diagnosis Date  . Anemia   . Gallstones   . H. pylori infection   . Headache(784.0)    chronic  . Hypertension    GHTN with last pg  . Kidney stones   . Ovarian cyst   . Pregnancy induced hypertension   . UTI (lower urinary tract infection)    Past Surgical History:  Procedure Laterality Date  . CHOLECYSTECTOMY N/A 07/27/2012   Procedure: LAPAROSCOPIC CHOLECYSTECTOMY WITH INTRAOPERATIVE CHOLANGIOGRAM;  Surgeon: BriMadilyn HookO;  Location: [facility];  Service: General;  Laterality: N/A;  . DIAGNOSTIC LAPAROSCOPY WITH REMOVAL OF ECTOPIC PREGNANCY N/A 12/27/2014   Procedure: DIAGNOSTIC LAPAROSCOPY, Right Salpingectomy with removal ECTOPIC PREGNANCY;  Surgeon: TanDonnamae JudeD;  [facility];  Service: Gynecology;  Laterality: N/A;  . LAPAROSCOPIC APPENDECTOMY N/A 12/09/2012   Procedure: APPENDECTOMY LAPAROSCOPIC;  Surgeon: JamGwenyth OberD;  Location: [facility];  Service: General;  Laterality: N/A;   Family History  Problem Relation Age of Onset   . Asthma Mother   . Hypertension Mother   . Parkinsonism Mother   . Asthma Brother   . Diabetes Maternal Grandmother   . Hypertension Maternal Grandmother   . Stroke Maternal Grandmother   . Heart disease Maternal Grandmother   . Lupus Maternal Aunt   . Heart disease Maternal Grandfather   . Anesthesia problems Neg Hx   . Malignant hyperthermia Neg Hx   . Pseudochol deficiency Neg Hx   . Hypotension Neg Hx    Social History   Tobacco Use  . Smoking status: Former Smoker    Types: Cigarettes  . Smokeless tobacco: Never Used  . Tobacco comment: 2014  Substance Use Topics  . Alcohol use: Not Currently    Comment: social  . Drug use: No   Allergies  Allergen Reactions  . Amoxicillin Nausea And Vomiting and Other (See Comments)    Tics & twitches Has patient had a PCN reaction causing immediate rash, facial/tongue/throat swelling, SOB or lightheadedness with hypotension: Yes Has patient had a PCN reaction causing severe rash involving mucus membranes or skin necrosis: No Has patient had a PCN reaction that required hospitalization No Has patient had a PCN reaction occurring within  the last 10 years: Yes If all of the above answers are "NO", then may proceed with Cephalosporin use.  . Contrast Media [Iodinated Diagnostic Agents] Hives  . Dilaudid [Hydromorphone Hcl] Itching   Current Outpatient Medications on File Prior to Visit  Medication Sig Dispense Refill  . metoCLOPramide (REGLAN) 10 MG tablet Take 1 tablet (10 mg total) by mouth 3 (three) times daily with meals for 10 days. 30 tablet 0  . omeprazole (PRILOSEC) 20 MG capsule Take 20 mg by mouth 2 (two) times daily.  0  . ondansetron (ZOFRAN ODT) 8 MG disintegrating tablet Take 1 tablet (8 mg total) by mouth every 8 (eight) hours as needed for nausea or vomiting. (Patient not taking: Reported on 12/01/2017) 20 tablet 0  . promethazine (PHENERGAN) 25 MG suppository Place 1 suppository (25 mg total) rectally at bedtime.  (Patient not taking: Reported on 12/01/2017) 30 each 0   No current facility-administered medications on file prior to visit.     Review of Systems Pertinent items noted in HPI and remainder of comprehensive ROS otherwise negative.  Exam   Vitals:   12/01/17 0842  BP: (!) 104/59  Pulse: 91  Weight: 206 lb 11.2 oz (93.8 kg)   Fetal Heart Rate (bpm): 152  Uterus:   11 week size   Pelvic Exam: Perineum:    Vulva:    Vagina:     Cervix:    Adnexa:    Bony Pelvis: average  System: General: well-developed, well-nourished female in no acute distress   Breast:  normal appearance, no masses or tenderness   Skin: normal coloration and turgor, no rashes   Neurologic: oriented, normal, negative, normal mood   Extremities: normal strength, tone, and muscle mass, ROM of all joints is normal   HEENT PERRLA, extraocular movement intact and sclera clear, anicteric   Mouth/Teeth mucous membranes moist, pharynx normal without lesions and dental hygiene good   Neck supple and no masses   Cardiovascular: regular rate and rhythm   Respiratory:  no respiratory distress, normal breath sounds   Abdomen: soft, non-tender; bowel sounds normal; no masses,  no organomegaly    Assessment:   Pregnancy: [id number] Patient Active Problem List   Diagnosis Date Noted  . Supervision of other normal pregnancy, antepartum 12/01/2017  . History of gestational hypertension 12/01/2017  . Ruptured ectopic pregnancy 12/27/2014  . 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 Generalized headaches 03/27/2011  . H. pylori infection 02/13/2011     Plan:  1. Supervision of other normal pregnancy, antepartum - Routine care  - Obstetric Panel, Including HIV - Comp Met (CMET) - Protein / creatinine ratio, urine - Genetic Screening - US MFM OB DETAIL +14 WK; Future - Culture, OB Urine - SMN1 Copy Number Analysis - Cervicovaginal ancillary only - Cystic fibrosis gene test - Hemoglobinopathy Evaluation - HgB A1c  2. History  of gestational hypertension - Start baby ASA at 12 weeks    Initial labs drawn. Continue prenatal vitamins. Genetic Screening discussed, AFP and NIPS: requested. Ultrasound discussed; fetal anatomic survey: requested. Problem list reviewed and updated. The nature of [facility] with multiple MDs and other Advanced Practice Providers was explained to patient; also emphasized that residents, students are part of our team. Routine obstetric precautions reviewed. 50% of 45 min visit spent in counseling and coordination of care. Return in about 4 weeks (around 12/29/2017).

## 2017-12-02 LAB — PROTEIN / CREATININE RATIO, URINE
Creatinine, Urine: 283.2 mg/dL
Protein, Ur: 38.2 mg/dL
Protein/Creat Ratio: 135 mg/g creat (ref 0–200)

## 2017-12-03 LAB — CULTURE, OB URINE

## 2017-12-03 LAB — URINE CULTURE, OB REFLEX

## 2017-12-08 ENCOUNTER — Encounter: Payer: Self-pay | Admitting: *Deleted

## 2017-12-09 LAB — SMN1 COPY NUMBER ANALYSIS (SMA CARRIER SCREENING)

## 2017-12-09 LAB — COMPREHENSIVE METABOLIC PANEL
ALT: 11 IU/L (ref 0–32)
AST: 14 IU/L (ref 0–40)
Albumin/Globulin Ratio: 1.7 (ref 1.2–2.2)
Albumin: 4.3 g/dL (ref 3.5–5.5)
Alkaline Phosphatase: 53 IU/L (ref 39–117)
BUN/Creatinine Ratio: 13 (ref 9–23)
BUN: 7 mg/dL (ref 6–20)
Bilirubin Total: <0.2 mg/dL (ref 0.0–1.2)
CO2: 20 mmol/L (ref 20–29)
Calcium: 9.5 mg/dL (ref 8.7–10.2)
Chloride: 102 mmol/L (ref 96–106)
Creatinine, Ser: 0.52 mg/dL — ABNORMAL LOW (ref 0.57–1.00)
GFR calc Af Amer: 150 mL/min/{1.73_m2} (ref >59)
GFR calc non Af Amer: 130 mL/min/{1.73_m2} (ref >59)
Globulin, Total: 2.6 g/dL (ref 1.5–4.5)
Glucose: 74 mg/dL (ref 65–99)
Potassium: 3.8 mmol/L (ref 3.5–5.2)
Sodium: 137 mmol/L (ref 134–144)
Total Protein: 6.9 g/dL (ref 6.0–8.5)

## 2017-12-09 LAB — OBSTETRIC PANEL, INCLUDING HIV
Antibody Screen: NEGATIVE
Basophils Absolute: 0 10*3/uL (ref 0.0–0.2)
Basos: 0 %
EOS (ABSOLUTE): 0.3 10*3/uL (ref 0.0–0.4)
Eos: 4 %
HIV Screen 4th Generation wRfx: NONREACTIVE
Hematocrit: 33.9 % — ABNORMAL LOW (ref 34.0–46.6)
Hemoglobin: 11.4 g/dL (ref 11.1–15.9)
Hepatitis B Surface Ag: NEGATIVE
Immature Grans (Abs): 0 10*3/uL (ref 0.0–0.1)
Immature Granulocytes: 0 %
Lymphocytes Absolute: 1.8 10*3/uL (ref 0.7–3.1)
Lymphs: 22 %
MCH: 28.1 pg (ref 26.6–33.0)
MCHC: 33.6 g/dL (ref 31.5–35.7)
MCV: 84 fL (ref 79–97)
Monocytes Absolute: 0.4 10*3/uL (ref 0.1–0.9)
Monocytes: 5 %
Neutrophils Absolute: 5.6 10*3/uL (ref 1.4–7.0)
Neutrophils: 69 %
Platelets: 343 10*3/uL (ref 150–450)
RBC: 4.06 x10E6/uL (ref 3.77–5.28)
RDW: 15.4 % (ref 12.3–15.4)
RPR Ser Ql: NONREACTIVE
Rh Factor: POSITIVE
Rubella Antibodies, IGG: 4.01 index (ref >0.99)
WBC: 8 10*3/uL (ref 3.4–10.8)

## 2017-12-09 LAB — CYSTIC FIBROSIS GENE TEST

## 2017-12-09 LAB — HEMOGLOBIN A1C
Est. average glucose Bld gHb Est-mCnc: 103 mg/dL
Hgb A1c MFr Bld: 5.2 % (ref 4.8–5.6)

## 2017-12-09 LAB — HEMOGLOBINOPATHY EVALUATION
Ferritin: 22 ng/mL (ref 15–150)
Hgb A2 Quant: 2.2 % (ref 1.8–3.2)
Hgb A: 97.8 % (ref 96.4–98.8)
Hgb C: 0 %
Hgb F Quant: 0 % (ref 0.0–2.0)
Hgb S: 0 %
Hgb Solubility: NEGATIVE
Hgb Variant: 0 %

## 2017-12-14 ENCOUNTER — Encounter (HOSPITAL_COMMUNITY): Payer: Self-pay

## 2017-12-14 ENCOUNTER — Ambulatory Visit (HOSPITAL_COMMUNITY)
Admission: EM | Admit: 2017-12-14 | Discharge: 2017-12-14 | Disposition: A | Discharge status: Home / Self Care | Payer: Medicaid Other | Attending: Internal Medicine | Admitting: Internal Medicine

## 2017-12-14 ENCOUNTER — Emergency Department (HOSPITAL_COMMUNITY)
Admission: EM | Admit: 2017-12-14 | Discharge: 2017-12-14 | Disposition: A | Discharge status: Home / Self Care | Payer: Medicaid Other | Attending: Emergency Medicine | Admitting: Emergency Medicine

## 2017-12-14 ENCOUNTER — Other Ambulatory Visit: Payer: Self-pay

## 2017-12-14 ENCOUNTER — Encounter (HOSPITAL_COMMUNITY): Payer: Self-pay | Admitting: Emergency Medicine

## 2017-12-14 DIAGNOSIS — R51 Headache: Secondary | ICD-10-CM | POA: Insufficient documentation

## 2017-12-14 DIAGNOSIS — R519 Headache, unspecified: Secondary | ICD-10-CM

## 2017-12-14 DIAGNOSIS — R079 Chest pain, unspecified: Secondary | ICD-10-CM | POA: Insufficient documentation

## 2017-12-14 DIAGNOSIS — R0789 Other chest pain: Secondary | ICD-10-CM

## 2017-12-14 DIAGNOSIS — Z3A12 12 weeks gestation of pregnancy: Secondary | ICD-10-CM

## 2017-12-14 DIAGNOSIS — Z5321 Procedure and treatment not carried out due to patient leaving prior to being seen by health care provider: Secondary | ICD-10-CM | POA: Diagnosis not present

## 2017-12-14 LAB — CBC
HCT: 35.4 % — ABNORMAL LOW (ref 36.0–46.0)
Hemoglobin: 11.4 g/dL — ABNORMAL LOW (ref 12.0–15.0)
MCH: 28 pg (ref 26.0–34.0)
MCHC: 32.2 g/dL (ref 30.0–36.0)
MCV: 87 fL (ref 78.0–100.0)
Platelets: 301 10*3/uL (ref 150–400)
RBC: 4.07 MIL/uL (ref 3.87–5.11)
RDW: 14.6 % (ref 11.5–15.5)
WBC: 10.9 10*3/uL — ABNORMAL HIGH (ref 4.0–10.5)

## 2017-12-14 LAB — BASIC METABOLIC PANEL
Anion gap: 8 (ref 5–15)
BUN: 6 mg/dL (ref 6–20)
CO2: 21 mmol/L — ABNORMAL LOW (ref 22–32)
Calcium: 9.2 mg/dL (ref 8.9–10.3)
Chloride: 106 mmol/L (ref 98–111)
Creatinine, Ser: 0.55 mg/dL (ref 0.44–1.00)
GFR calc Af Amer: >60 mL/min (ref >60)
GFR calc non Af Amer: >60 mL/min (ref >60)
Glucose, Bld: 91 mg/dL (ref 70–99)
Potassium: 3.3 mmol/L — ABNORMAL LOW (ref 3.5–5.1)
Sodium: 135 mmol/L (ref 135–145)

## 2017-12-14 LAB — I-STAT TROPONIN, ED: Troponin i, poc: 0 ng/mL (ref 0.00–0.08)

## 2017-12-14 NOTE — ED Triage Notes (Signed)
C/o R sided headache and R ear pain x 2 days.  Pain worse with palpation.  States she went to [facility] to be seen and started having SOB and tightness to center of chest while in the waiting room.  Pt states she is [redacted] weeks pregnant.

## 2017-12-14 NOTE — ED Notes (Signed)
No answer for lobby vitals recheck.

## 2017-12-14 NOTE — ED Provider Notes (Signed)
[place]   [id number] 12/14/17 Arrival Time: 1420  DJ:MEQASTMH and chest pain  SUBJECTIVE:  Emma Chambers is a 27 y.o. female currently 3 months pregnant, who complains of migraine x2 days.  Denies a precipitating event, or recent head trauma.  Patient localizes her pain to the top of head.  Describes the pain as constant and achy and sharp in character.  Patient has tried OTC baby ASA without relief. Symptoms are made worse with light and noise.  Reports similar symptoms in the past.  States this is the worst headache of her life. Complains of chills, and aura.  Patient denies fever, nausea, vomiting, rhinorrhea, watery eyes, SOB, abdominal pain, weakness, numbness or tingling.    Patient also mentions chest pain that began 30 minutes ago while waiting to be seen.  Denies a precipitating event, recent cough or chest injury.  States it feels like someone is squeezing her chest.  Chest pain is 8/10.  Denies aggravating or allevating factors.  Denies previous hx of chest pain.  Denies lightheadedness, dizziness, SOB, abdominal pain, or weakness.    ROS: As per HPI.  Past Medical History:  Diagnosis Date  . Anemia   . Gallstones   . H. pylori infection   . Headache(784.0)    chronic  . Hypertension    GHTN with last pg  . Kidney stones   . Ovarian cyst   . Pregnancy induced hypertension   . UTI (lower urinary tract infection)    Past Surgical History:  Procedure Laterality Date  . CHOLECYSTECTOMY N/A 07/27/2012   Procedure: LAPAROSCOPIC CHOLECYSTECTOMY WITH INTRAOPERATIVE CHOLANGIOGRAM;  Surgeon: Madilyn Hook, DO;  Location: [facility];  Service: General;  Laterality: N/A;  . DIAGNOSTIC LAPAROSCOPY WITH REMOVAL OF ECTOPIC PREGNANCY N/A 12/27/2014   Procedure: DIAGNOSTIC LAPAROSCOPY, Right Salpingectomy with removal ECTOPIC PREGNANCY;  Surgeon: [name], MD;  Location: [facility];  Service: Gynecology;  Laterality: N/A;  . LAPAROSCOPIC APPENDECTOMY N/A 12/09/2012   Procedure: APPENDECTOMY LAPAROSCOPIC;  Surgeon: Gwenyth Ober, MD;  Location: [facility];  Service: General;  Laterality: N/A;   Allergies  Allergen Reactions  . Amoxicillin Nausea And Vomiting and Other (See Comments)    Tics & twitches Has patient had a PCN reaction causing immediate rash, facial/tongue/throat swelling, SOB or lightheadedness with hypotension: Yes Has patient had a PCN reaction causing severe rash involving mucus membranes or skin necrosis: No Has patient had a PCN reaction that required hospitalization No Has patient had a PCN reaction occurring within the last 10 years: Yes If all of the above answers are "NO", then may proceed with Cephalosporin use.  . Contrast Media [Iodinated Diagnostic Agents] Hives  . Dilaudid [Hydromorphone Hcl] Itching   No current facility-administered medications on file prior to encounter.    Current Outpatient Medications on File Prior to Encounter  Medication Sig Dispense Refill  . aspirin EC 81 MG tablet Take 1 tablet (81 mg total) by mouth daily. 30 tablet 11  . famotidine (PEPCID) 20 MG tablet Take 1 tablet (20 mg total) by mouth 2 (two) times daily. (Patient not taking: Reported on 12/14/2017) 60 tablet 11  . ondansetron (ZOFRAN ODT) 8 MG disintegrating tablet Take 1 tablet (8 mg total) by mouth every 8 (eight) hours as needed for nausea or vomiting. (Patient not taking: Reported on 12/01/2017) 20 tablet 0  . prenatal vitamin w/FE, FA (PRENATAL 1 + 1) 27-1 MG TABS tablet Take 1 tablet by mouth daily at 12 noon. 30 each 11  .  promethazine (PHENERGAN) 25 MG tablet Take 0.5-1 tablets (12.5-25 mg total) by mouth every 6 (six) hours as needed for nausea or vomiting. (Patient not taking: Reported on 12/14/2017) 30 tablet 0   Social History   Socioeconomic History  . Marital status: Single    Spouse name: Not on file  . Number of children: Not on file  . Years of education: Not on file  . Highest education level: Not on file  Occupational  History  . Not on file  Social Needs  . Financial resource strain: Not on file  . Food insecurity:    Worry: Not on file    Inability: Not on file  . Transportation needs:    Medical: Not on file    Non-medical: Not on file  Tobacco Use  . Smoking status: Former Smoker    Types: Cigarettes  . Smokeless tobacco: Never Used  . Tobacco comment: 2014  Substance and Sexual Activity  . Alcohol use: Not Currently    Comment: social  . Drug use: No  . Sexual activity: Yes    Comment: last end of july 2019  Lifestyle  . Physical activity:    Days per week: Not on file    Minutes per session: Not on file  . Stress: Not on file  Relationships  . Social connections:    Talks on phone: Not on file    Gets together: Not on file    Attends religious service: Not on file    Active member of club or organization: Not on file    Attends meetings of clubs or organizations: Not on file    Relationship status: Not on file  . Intimate partner violence:    Fear of current or ex partner: Not on file    Emotionally abused: Not on file    Physically abused: Not on file    Forced sexual activity: Not on file  Other Topics Concern  . Not on file  Social History Narrative  . Not on file   Family History  Problem Relation Age of Onset  . Asthma Mother   . Hypertension Mother   . Parkinsonism Mother   . Asthma Brother   . Diabetes Maternal Grandmother   . Hypertension Maternal Grandmother   . Stroke Maternal Grandmother   . Heart disease Maternal Grandmother   . Lupus Maternal Aunt   . Heart disease Maternal Grandfather   . Anesthesia problems Neg Hx   . Malignant hyperthermia Neg Hx   . Pseudochol deficiency Neg Hx   . Hypotension Neg Hx     OBJECTIVE:  Vitals:   12/14/17 1458 12/14/17 1500  BP: 123/65   Pulse: 89   Resp: 18   Temp: 99.2 F (37.3 C)   SpO2: 100%   Weight:  208 lb (94.3 kg)    General appearance: alert; no distress; nontoxic appearing Eyes: PERRLA;  EOMI HENT: normocephalic; atraumatic Neck: supple with FROM Lungs: clear to auscultation bilaterally Heart: regular rate and rhythm.  Radial pulses 2+ symmetrical bilaterally Chest: mildly tender with AP compression Extremities: no edema; symmetrical with no gross deformities Skin: warm and dry Neurologic: CN 2-12 grossly intact; finger to nose without difficulty; 1+ patella reflexes; strength and sensation intact bilaterally about the upper and lower extremities Psychological: alert and cooperative; normal mood and affect  EKG:   EKG normal sinus rhythm without ST elevations, depressions, or prolonged PR interval.  No narrowing or widening of the QRS complexes.    ASSESSMENT &  PLAN:  1. Severe headache   2. Other chest pain   3. [redacted] weeks gestation of pregnancy     No orders of the defined types were placed in this encounter.  Recommending further evaluation and management in ED for worst headache and new onset chest pain that began today without precipitating event.   Patient aware and in agreement with this plan.   Reviewed expectations re: course of current medical issues. Questions answered. Outlined signs and symptoms indicating need for more acute intervention. Patient verbalized understanding. After Visit Summary given.   Lestine Box, PA-C 12/14/17 1634

## 2017-12-14 NOTE — ED Triage Notes (Addendum)
Pt states she has a migraine and right ear discomfort x 2 days

## 2017-12-14 NOTE — ED Notes (Signed)
No answer for lobby vitals recheck

## 2017-12-14 NOTE — ED Notes (Signed)
No answer for lobby vitals recheck x3.

## 2017-12-14 NOTE — Discharge Instructions (Addendum)
Recommending further evaluation and management in ED for worst headache and new onset chest pain that began today without precipitating event.   Patient aware and in agreement with this plan.

## 2017-12-30 ENCOUNTER — Encounter: Payer: Self-pay | Admitting: Advanced Practice Midwife

## 2018-01-19 ENCOUNTER — Encounter (HOSPITAL_COMMUNITY): Payer: Self-pay

## 2018-01-22 ENCOUNTER — Encounter: Payer: Self-pay | Admitting: *Deleted

## 2018-01-26 ENCOUNTER — Other Ambulatory Visit (HOSPITAL_COMMUNITY): Payer: Self-pay | Admitting: *Deleted

## 2018-01-26 ENCOUNTER — Ambulatory Visit (HOSPITAL_COMMUNITY)
Admission: RE | Admit: 2018-01-26 | Discharge: 2018-01-26 | Disposition: A | Discharge status: Home / Self Care | Payer: Medicaid Other | Source: Ambulatory Visit | Attending: Advanced Practice Midwife | Admitting: Advanced Practice Midwife

## 2018-01-26 ENCOUNTER — Encounter (HOSPITAL_COMMUNITY): Payer: Self-pay

## 2018-01-26 DIAGNOSIS — Z3A19 19 weeks gestation of pregnancy: Secondary | ICD-10-CM | POA: Diagnosis not present

## 2018-01-26 DIAGNOSIS — Z3480 Encounter for supervision of other normal pregnancy, unspecified trimester: Secondary | ICD-10-CM

## 2018-01-26 DIAGNOSIS — O09212 Supervision of pregnancy with history of pre-term labor, second trimester: Secondary | ICD-10-CM | POA: Diagnosis not present

## 2018-01-26 DIAGNOSIS — Z3482 Encounter for supervision of other normal pregnancy, second trimester: Secondary | ICD-10-CM | POA: Insufficient documentation

## 2018-01-26 DIAGNOSIS — O09292 Supervision of pregnancy with other poor reproductive or obstetric history, second trimester: Secondary | ICD-10-CM | POA: Diagnosis not present

## 2018-01-26 DIAGNOSIS — Z3689 Encounter for other specified antenatal screening: Secondary | ICD-10-CM

## 2018-01-26 DIAGNOSIS — O99212 Obesity complicating pregnancy, second trimester: Secondary | ICD-10-CM | POA: Diagnosis not present

## 2018-01-26 DIAGNOSIS — Z363 Encounter for antenatal screening for malformations: Secondary | ICD-10-CM | POA: Diagnosis not present

## 2018-01-31 ENCOUNTER — Encounter (HOSPITAL_COMMUNITY): Payer: Self-pay

## 2018-01-31 ENCOUNTER — Inpatient Hospital Stay (HOSPITAL_COMMUNITY)
Admission: AD | Admit: 2018-01-31 | Discharge: 2018-01-31 | Disposition: A | Discharge status: Home / Self Care | Payer: Medicaid Other | Source: Ambulatory Visit | Attending: Obstetrics & Gynecology | Admitting: Obstetrics & Gynecology

## 2018-01-31 ENCOUNTER — Other Ambulatory Visit: Payer: Self-pay

## 2018-01-31 DIAGNOSIS — O99352 Diseases of the nervous system complicating pregnancy, second trimester: Secondary | ICD-10-CM | POA: Diagnosis not present

## 2018-01-31 DIAGNOSIS — R102 Pelvic and perineal pain: Secondary | ICD-10-CM | POA: Insufficient documentation

## 2018-01-31 DIAGNOSIS — G44209 Tension-type headache, unspecified, not intractable: Secondary | ICD-10-CM | POA: Insufficient documentation

## 2018-01-31 DIAGNOSIS — Z3A19 19 weeks gestation of pregnancy: Secondary | ICD-10-CM | POA: Diagnosis not present

## 2018-01-31 DIAGNOSIS — O26892 Other specified pregnancy related conditions, second trimester: Secondary | ICD-10-CM | POA: Insufficient documentation

## 2018-01-31 DIAGNOSIS — R51 Headache: Secondary | ICD-10-CM | POA: Diagnosis present

## 2018-01-31 DIAGNOSIS — O26899 Other specified pregnancy related conditions, unspecified trimester: Secondary | ICD-10-CM

## 2018-01-31 DIAGNOSIS — G44201 Tension-type headache, unspecified, intractable: Secondary | ICD-10-CM | POA: Diagnosis not present

## 2018-01-31 DIAGNOSIS — R109 Unspecified abdominal pain: Secondary | ICD-10-CM | POA: Diagnosis present

## 2018-01-31 LAB — URINALYSIS, ROUTINE W REFLEX MICROSCOPIC
Bilirubin Urine: NEGATIVE
Glucose, UA: NEGATIVE mg/dL
Hgb urine dipstick: NEGATIVE
Ketones, ur: NEGATIVE mg/dL
Leukocytes, UA: NEGATIVE
Nitrite: NEGATIVE
Protein, ur: NEGATIVE mg/dL
Specific Gravity, Urine: 1.021 (ref 1.005–1.030)
pH: 9 — ABNORMAL HIGH (ref 5.0–8.0)

## 2018-01-31 LAB — WET PREP, GENITAL
Clue Cells Wet Prep HPF POC: NONE SEEN
Sperm: NONE SEEN
Trich, Wet Prep: NONE SEEN
Yeast Wet Prep HPF POC: NONE SEEN

## 2018-01-31 MED ORDER — COMFORT FIT MATERNITY SUPP LG MISC: 1.0000 [IU] | Freq: Every day | 0 refills | Status: DC

## 2018-01-31 MED ORDER — CYCLOBENZAPRINE HCL 10 MG PO TABS
10.0000 mg | ORAL_TABLET | Freq: Once | ORAL | Status: AC
  Administered 2018-01-31: 10 mg via ORAL
  Filled 2018-01-31: qty 1

## 2018-01-31 MED ORDER — BUTALBITAL-APAP-CAFFEINE 50-325-40 MG PO TABS: 1.0000 | ORAL_TABLET | Freq: Four times a day (QID) | ORAL | 0 refills | Status: DC | PRN

## 2018-01-31 NOTE — MAU Note (Signed)
Pt states lower abd pain started 3 days ago. She rates pain 8/10 and took tylenol at 0600 this morning.  Pt also reports a headache for the past week that she rates 8/10.  Pt denies vag bleeding or dc.

## 2018-01-31 NOTE — MAU Provider Note (Signed)
History     CSN: [id number]  Arrival date and time: 01/31/18 8366   First Provider Initiated Contact with Patient 01/31/18 1006      Chief Complaint  Patient presents with  . Abdominal Pain  . Headache   Emma Chambers is a 27 y.o. G4P2 at [redacted]w[redacted]d who presents to MAU with complaints of abdominal pain and HA. She reports abdominal pain started occurring 3 days ago. She reports lower abdominal pain, describes pain as sharp burning pain that gets worse with movement, rates pain 8/10- has taken Tylenol for abdominal pain and HA with no relief. She reports HA has been occurring for the past week, has hx of HA prior to getting pregnant was on medication for HA but does not know what medication she was on, "does not remember name". Rates HA 8/10- took tylenol at 0600 this morning with no relief. She denies vaginal bleeding, discharge, urinary symptoms or N/V.    OB History    Gravida  4   Para  2   Term  1   Preterm  1   AB  1   Living  2     SAB  0   TAB  0   Ectopic  1   Multiple  0   Live Births  2        Obstetric Comments  2014: IOL at 66 weeks 2/2 gallstones 2013: GHTN approx 2017, ectopic pregnancy: Had methotrexate and then had surgery         Past Medical History:  Diagnosis Date  . Anemia   . Gallstones   . H. pylori infection   . Headache(784.0)    chronic  . Hypertension    GHTN with last pg  . Kidney stones   . Ovarian cyst   . Pregnancy induced hypertension   . UTI (lower urinary tract infection)     Past Surgical History:  Procedure Laterality Date  . CHOLECYSTECTOMY N/A 07/27/2012   Procedure: LAPAROSCOPIC CHOLECYSTECTOMY WITH INTRAOPERATIVE CHOLANGIOGRAM;  Surgeon: Madilyn Hook, DO;  Location: [facility];  Service: General;  Laterality: N/A;  . DIAGNOSTIC LAPAROSCOPY WITH REMOVAL OF ECTOPIC PREGNANCY N/A 12/27/2014   Procedure: DIAGNOSTIC LAPAROSCOPY, Right Salpingectomy with removal ECTOPIC PREGNANCY;  Surgeon: [name], MD;   Location: [facility];  Service: Gynecology;  Laterality: N/A;  . LAPAROSCOPIC APPENDECTOMY N/A 12/09/2012   Procedure: APPENDECTOMY LAPAROSCOPIC;  Surgeon: Gwenyth Ober, MD;  Location: [facility];  Service: General;  Laterality: N/A;    Family History  Problem Relation Age of Onset  . Asthma Mother   . Hypertension Mother   . Parkinsonism Mother   . Asthma Brother   . Diabetes Maternal Grandmother   . Hypertension Maternal Grandmother   . Stroke Maternal Grandmother   . Heart disease Maternal Grandmother   . Lupus Maternal Aunt   . Heart disease Maternal Grandfather   . Anesthesia problems Neg Hx   . Malignant hyperthermia Neg Hx   . Pseudochol deficiency Neg Hx   . Hypotension Neg Hx     Social History   Tobacco Use  . Smoking status: Former Smoker    Types: Cigarettes  . Smokeless tobacco: Never Used  . Tobacco comment: 2014  Substance Use Topics  . Alcohol use: Not Currently    Comment: social  . Drug use: No    Allergies:  Allergies  Allergen Reactions  . Amoxicillin Nausea And Vomiting and Other (See Comments)    Tics & twitches Has  patient had a PCN reaction causing immediate rash, facial/tongue/throat swelling, SOB or lightheadedness with hypotension: Yes Has patient had a PCN reaction causing severe rash involving mucus membranes or skin necrosis: No Has patient had a PCN reaction that required hospitalization No Has patient had a PCN reaction occurring within the last 10 years: Yes If all of the above answers are "NO", then may proceed with Cephalosporin use.  . Contrast Media [Iodinated Diagnostic Agents] Hives  . Dilaudid [Hydromorphone Hcl] Itching    Medications Prior to Admission  Medication Sig Dispense Refill Last Dose  . aspirin EC 81 MG tablet Take 1 tablet (81 mg total) by mouth daily. 30 tablet 11 01/30/2018 at Unknown time  . prenatal vitamin w/FE, FA (PRENATAL 1 + 1) 27-1 MG TABS tablet Take 1 tablet by mouth daily at 12 noon. 30 each 11 01/30/2018 at  Unknown time    Review of Systems  Constitutional: Negative.   Respiratory: Negative.   Cardiovascular: Negative.   Gastrointestinal: Positive for abdominal pain. Negative for constipation, diarrhea, nausea and vomiting.  Genitourinary: Negative.   Musculoskeletal: Negative.   Neurological: Positive for headaches.   Physical Exam   Blood pressure (!) 106/53, pulse 99, temperature 98.9 F (37.2 C), temperature source Oral, resp. rate 18, height 5' (1.524 m), weight 97 kg, last menstrual period 09/15/2017, SpO2 100 %.  Physical Exam  Nursing note and vitals reviewed. Constitutional: She is oriented to person, place, and time. She appears well-developed and well-nourished. No distress.  Cardiovascular: Normal rate, regular rhythm and normal heart sounds.  Respiratory: Effort normal and breath sounds normal. No respiratory distress. She has no wheezes.  GI: Soft. Bowel sounds are normal. There is no tenderness. There is no rebound and no guarding.  Musculoskeletal: Normal range of motion. She exhibits no edema.  Neurological: She is alert and oriented to person, place, and time.  Psychiatric: She has a normal mood and affect. Her behavior is normal. Thought content normal.   Dilation: Closed Effacement (%): Thick Cervical Position: Posterior Exam by:: Wende Bushy CNM   MAU Course  Procedures  MDM Orders Placed This Encounter  Procedures  . Wet prep, genital  . Urinalysis, Routine w reflex microscopic   Meds ordered this encounter  Medications  . cyclobenzaprine (FLEXERIL) tablet 10 mg  . butalbital-acetaminophen-caffeine (FIORICET, ESGIC) 50-325-40 MG tablet    Sig: Take 1 tablet by mouth every 6 (six) hours as needed for headache.    Dispense:  20 tablet    Refill:  0    Order Specific Question:   Supervising Provider    Answer:   [place] [facility] [[id number]]  . Elastic Bandages & Supports (COMFORT FIT MATERNITY SUPP LG) MISC    Sig: 1 Units by Does not apply route daily.     Dispense:  1 each    Refill:  0    Order Specific Question:   Supervising Provider    Answer:   [place] [facility] [phone]   Results for orders placed or performed during the hospital encounter of 01/31/18 (from the past 24 hour(s))  Urinalysis, Routine w reflex microscopic     Status: Abnormal   Collection Time: 01/31/18 10:31 AM  Result Value Ref Range   Color, Urine YELLOW YELLOW   APPearance HAZY (A) CLEAR   Specific Gravity, Urine 1.021 1.005 - 1.030   pH 9.0 (H) 5.0 - 8.0   Glucose, UA NEGATIVE NEGATIVE mg/dL   Hgb urine dipstick NEGATIVE NEGATIVE   Bilirubin Urine  NEGATIVE NEGATIVE   Ketones, ur NEGATIVE NEGATIVE mg/dL   Protein, ur NEGATIVE NEGATIVE mg/dL   Nitrite NEGATIVE NEGATIVE   Leukocytes, UA NEGATIVE NEGATIVE  Wet prep, genital     Status: Abnormal   Collection Time: 01/31/18 11:21 AM  Result Value Ref Range   Yeast Wet Prep HPF POC NONE SEEN NONE SEEN   Trich, Wet Prep NONE SEEN NONE SEEN   Clue Cells Wet Prep HPF POC NONE SEEN NONE SEEN   WBC, Wet Prep HPF POC FEW (A) NONE SEEN   Sperm NONE SEEN    UA and wet prep negative, GC/C pending  Treatments in MAU included 10mg  Flexeril for abdominal pain and HA. Upon reassessment patient asleep in room. Patient reports abdominal pain is relieved with medication, still complaints of mild HA rates pain 2/10.   Educated and discussed Fioricet during pregnancy. Rx sent to pharmacy of choice. Discussed reasons to return to MAU. Patient has not been seen for prenatal care since early September, appointment made for ROB next week. Patient aware. Pt stable at time of discharge.   Assessment and Plan   1. Acute intractable tension-type headache   2. [redacted] weeks gestation of pregnancy   3. Pain of round ligament during pregnancy    Discharge home  Rx for fioricet and maternity support belt  Discussed reasons to return to MAU  ROB scheduled for next week  Continue medication as prescribed   [place] for  [facility]. Go on 02/04/2018.   Specialty:  Obstetrics and Gynecology Why:  Go to prenatal appointment on 11/20 at 220pm  Contact information: [place] [phone]       [employer] Follow up.   Why:  Go here to pick up maternity support belt  Contact information: [address] [place]           Allergies as of 01/31/2018      Reactions   Amoxicillin Nausea And Vomiting, Other (See Comments)   Tics & twitches Has patient had a PCN reaction causing immediate rash, facial/tongue/throat swelling, SOB or lightheadedness with hypotension: Yes Has patient had a PCN reaction causing severe rash involving mucus membranes or skin necrosis: No Has patient had a PCN reaction that required hospitalization No Has patient had a PCN reaction occurring within the last 10 years: Yes If all of the above answers are "NO", then may proceed with Cephalosporin use.   Contrast Media [iodinated Diagnostic Agents] Hives   Dilaudid [hydromorphone Hcl] Itching      Medication List    TAKE these medications   aspirin EC 81 MG tablet Take 1 tablet (81 mg total) by mouth daily.   butalbital-acetaminophen-caffeine 50-325-40 MG tablet Commonly known as:  FIORICET, ESGIC Take 1 tablet by mouth every 6 (six) hours as needed for headache.   COMFORT FIT MATERNITY SUPP LG Misc 1 Units by Does not apply route daily.   prenatal vitamin w/FE, FA 27-1 MG Tabs tablet Take 1 tablet by mouth daily at 12 noon.       Lajean Manes CNM  01/31/2018, 1:29 PM

## 2018-02-02 LAB — GC/CHLAMYDIA PROBE AMP (~~LOC~~) NOT AT ARMC
Chlamydia: NEGATIVE
Neisseria Gonorrhea: NEGATIVE

## 2018-02-04 ENCOUNTER — Encounter: Payer: Self-pay | Admitting: Nurse Practitioner

## 2018-02-04 ENCOUNTER — Ambulatory Visit: Payer: Self-pay

## 2018-02-18 ENCOUNTER — Encounter: Payer: Self-pay | Admitting: Nurse Practitioner

## 2018-02-18 ENCOUNTER — Encounter: Payer: Self-pay | Admitting: Family Medicine

## 2018-03-18 ENCOUNTER — Inpatient Hospital Stay (HOSPITAL_COMMUNITY)
Admission: AD | Admit: 2018-03-18 | Discharge: 2018-03-18 | Disposition: A | Discharge status: Home / Self Care | Payer: Medicaid Other | Attending: Obstetrics and Gynecology | Admitting: Obstetrics and Gynecology

## 2018-03-18 ENCOUNTER — Other Ambulatory Visit: Payer: Self-pay

## 2018-03-18 ENCOUNTER — Encounter (HOSPITAL_COMMUNITY): Payer: Self-pay

## 2018-03-18 DIAGNOSIS — M549 Dorsalgia, unspecified: Secondary | ICD-10-CM | POA: Diagnosis not present

## 2018-03-18 DIAGNOSIS — R1030 Lower abdominal pain, unspecified: Secondary | ICD-10-CM | POA: Diagnosis not present

## 2018-03-18 DIAGNOSIS — Z87891 Personal history of nicotine dependence: Secondary | ICD-10-CM | POA: Diagnosis not present

## 2018-03-18 DIAGNOSIS — O26892 Other specified pregnancy related conditions, second trimester: Secondary | ICD-10-CM | POA: Diagnosis not present

## 2018-03-18 DIAGNOSIS — Z3A26 26 weeks gestation of pregnancy: Secondary | ICD-10-CM

## 2018-03-18 DIAGNOSIS — O99891 Other specified diseases and conditions complicating pregnancy: Secondary | ICD-10-CM

## 2018-03-18 DIAGNOSIS — O9989 Other specified diseases and conditions complicating pregnancy, childbirth and the puerperium: Secondary | ICD-10-CM | POA: Diagnosis not present

## 2018-03-18 DIAGNOSIS — Z880 Allergy status to penicillin: Secondary | ICD-10-CM | POA: Diagnosis not present

## 2018-03-18 LAB — URINALYSIS, ROUTINE W REFLEX MICROSCOPIC
Bilirubin Urine: NEGATIVE
Glucose, UA: NEGATIVE mg/dL
Hgb urine dipstick: NEGATIVE
Ketones, ur: NEGATIVE mg/dL
Nitrite: NEGATIVE
Protein, ur: NEGATIVE mg/dL
Specific Gravity, Urine: 1.025 (ref 1.005–1.030)
pH: 6 (ref 5.0–8.0)

## 2018-03-18 LAB — WET PREP, GENITAL
Clue Cells Wet Prep HPF POC: NONE SEEN
Sperm: NONE SEEN
Trich, Wet Prep: NONE SEEN
Yeast Wet Prep HPF POC: NONE SEEN

## 2018-03-18 LAB — FETAL FIBRONECTIN: Fetal Fibronectin: NEGATIVE

## 2018-03-18 MED ORDER — ONDANSETRON 4 MG PO TBDP
4.0000 mg | ORAL_TABLET | Freq: Once | ORAL | Status: AC
  Administered 2018-03-18: 4 mg via ORAL
  Filled 2018-03-18: qty 1

## 2018-03-18 MED ORDER — TRAMADOL HCL 50 MG PO TABS
100.0000 mg | ORAL_TABLET | Freq: Once | ORAL | Status: AC
  Administered 2018-03-18: 100 mg via ORAL
  Filled 2018-03-18: qty 2

## 2018-03-18 MED ORDER — CYCLOBENZAPRINE HCL 5 MG PO TABS
5.0000 mg | ORAL_TABLET | Freq: Once | ORAL | Status: AC
  Administered 2018-03-18: 5 mg via ORAL
  Filled 2018-03-18: qty 1

## 2018-03-18 NOTE — MAU Note (Signed)
Pt reports lower abd and back pain for the past hour.  Pt rates pain 10/10.  Pt also reports dizziness and feels light headed for past 2 hours.  Pt denies vag bleeding or LOF but reports some light pink dc once today when she used the bathroom.  Pt reports good fetal movement.

## 2018-03-18 NOTE — [facility] Provider Note (Signed)
History    Emma Chambers is a 28 y.o. [phone] at 26.2wks who presents for lower abdominal and back pain.  She states the pain started about one hour ago and woke her from sleep.  States pain is constant but gradually improves and worsens but has not gone away despite treatment.  Patient reports taking 2 tylenol (regular strength) about 15 minutes prior to arrival with no relief.  Patient does endorse a recent fall 2 days ago when attempting to go down the stairs.  She denies trauma to the abdomen or head during this incident and admits that she had some soreness, but no pain.  Patient states she last ate at 2100 and drank apple juice this morning.  No sexual activity in the last 48 hours.  Patient reports some light pink spotting with wiping, while at home but none prior to this incident or upon arrival.  Patient denies LOF and endorses fetal movement and reports that fetus "has been moving more than usual."  Patient denies other pain or discomfort.  Patient also denies vaginal concerns including itching, burning, discharge, or odor.     Patient Active Problem List   Diagnosis Date Noted  . Supervision of other normal pregnancy, antepartum 12/01/2017  . History of gestational hypertension 12/01/2017  . Ruptured ectopic pregnancy 12/27/2014  . 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 Generalized headaches 03/27/2011  . H. pylori infection 02/13/2011    Chief Complaint  Patient presents with  . Abdominal Pain  . Back Pain  . Dizziness   HPI  OB History    Gravida  4   Para  2   Term  1   Preterm  1   AB  1   Living  2     SAB  0   TAB  0   Ectopic  1   Multiple  0   Live Births  2        Obstetric Comments  2014: IOL at 79 weeks 2/2 gallstones 2013: GHTN approx 2017, ectopic pregnancy: Had methotrexate and then had surgery         Past Medical History:  Diagnosis Date  . Anemia   . Gallstones   . H. pylori infection   . Headache(784.0)    chronic  . Hypertension     GHTN with last pg  . Kidney stones   . Ovarian cyst   . Pregnancy induced hypertension   . UTI (lower urinary tract infection)     Past Surgical History:  Procedure Laterality Date  . CHOLECYSTECTOMY N/A 07/27/2012   Procedure: LAPAROSCOPIC CHOLECYSTECTOMY WITH INTRAOPERATIVE CHOLANGIOGRAM;  Surgeon: Madilyn Hook, DO;  Location: [facility];  Service: General;  Laterality: N/A;  . DIAGNOSTIC LAPAROSCOPY WITH REMOVAL OF ECTOPIC PREGNANCY N/A 12/27/2014   Procedure: DIAGNOSTIC LAPAROSCOPY, Right Salpingectomy with removal ECTOPIC PREGNANCY;  Surgeon: [name], MD;  Location: [facility];  Service: Gynecology;  Laterality: N/A;  . LAPAROSCOPIC APPENDECTOMY N/A 12/09/2012   Procedure: APPENDECTOMY LAPAROSCOPIC;  Surgeon: Gwenyth Ober, MD;  Location: [facility];  Service: General;  Laterality: N/A;    Family History  Problem Relation Age of Onset  . Asthma Mother   . Hypertension Mother   . Parkinsonism Mother   . Asthma Brother   . Diabetes Maternal Grandmother   . Hypertension Maternal Grandmother   . Stroke Maternal Grandmother   . Heart disease Maternal Grandmother   . Lupus Maternal Aunt   . Heart disease Maternal Grandfather   .  Anesthesia problems Neg Hx   . Malignant hyperthermia Neg Hx   . Pseudochol deficiency Neg Hx   . Hypotension Neg Hx     Social History   Tobacco Use  . Smoking status: Former Smoker    Types: Cigarettes  . Smokeless tobacco: Never Used  . Tobacco comment: 2014  Substance Use Topics  . Alcohol use: Not Currently    Comment: social  . Drug use: No    Allergies:  Allergies  Allergen Reactions  . Amoxicillin Nausea And Vomiting and Other (See Comments)    Tics & twitches Has patient had a PCN reaction causing immediate rash, facial/tongue/throat swelling, SOB or lightheadedness with hypotension: Yes Has patient had a PCN reaction causing severe rash involving mucus membranes or skin necrosis: No Has patient had a PCN reaction that required  hospitalization No Has patient had a PCN reaction occurring within the last 10 years: Yes If all of the above answers are "NO", then may proceed with Cephalosporin use.  . Contrast Media [Iodinated Diagnostic Agents] Hives  . Dilaudid [Hydromorphone Hcl] Itching    Medications Prior to Admission  Medication Sig Dispense Refill Last Dose  . aspirin EC 81 MG tablet Take 1 tablet (81 mg total) by mouth daily. 30 tablet 11 01/30/2018 at Unknown time  . butalbital-acetaminophen-caffeine (FIORICET, ESGIC) 50-325-40 MG tablet Take 1 tablet by mouth every 6 (six) hours as needed for headache. 20 tablet 0   . Elastic Bandages & Supports (COMFORT FIT MATERNITY SUPP LG) MISC 1 Units by Does not apply route daily. 1 each 0   . prenatal vitamin w/FE, FA (PRENATAL 1 + 1) 27-1 MG TABS tablet Take 1 tablet by mouth daily at 12 noon. 30 each 11 01/30/2018 at Unknown time    Review of Systems  Constitutional: Negative for chills and fever.  HENT: Negative for sore throat.   Eyes: Negative for blurred vision and double vision.  Respiratory: Negative for cough and shortness of breath.   Cardiovascular: Negative for chest pain and leg swelling.  Gastrointestinal: Positive for abdominal pain. Negative for constipation, diarrhea, nausea and vomiting.  Genitourinary: Negative for dysuria, frequency, hematuria and urgency.  Musculoskeletal: Positive for back pain and falls (2 days ago down the stairs, landed on side. Denies hitting abdomen or head. Marland Kitchen).  Neurological: Positive for dizziness. Negative for headaches.    See HPI Above Physical Exam   Blood pressure (!) 116/56, pulse 92, temperature 98.9 F (37.2 C), temperature source Oral, resp. rate 20, height 5' (1.524 m), weight 96.7 kg, last menstrual period 09/15/2017, SpO2 100 %.  Results for orders placed or performed during the hospital encounter of 03/18/18 (from the past 24 hour(s))  Urinalysis, Routine w reflex microscopic     Status: Abnormal    Collection Time: 03/18/18  9:16 AM  Result Value Ref Range   Color, Urine YELLOW YELLOW   APPearance HAZY (A) CLEAR   Specific Gravity, Urine 1.025 1.005 - 1.030   pH 6.0 5.0 - 8.0   Glucose, UA NEGATIVE NEGATIVE mg/dL   Hgb urine dipstick NEGATIVE NEGATIVE   Bilirubin Urine NEGATIVE NEGATIVE   Ketones, ur NEGATIVE NEGATIVE mg/dL   Protein, ur NEGATIVE NEGATIVE mg/dL   Nitrite NEGATIVE NEGATIVE   Leukocytes, UA TRACE (A) NEGATIVE   RBC / HPF 0-5 0 - 5 RBC/hpf   WBC, UA 0-5 0 - 5 WBC/hpf   Bacteria, UA RARE (A) NONE SEEN   Squamous Epithelial / LPF 11-20 0 - 5  Mucus PRESENT   Wet prep, genital     Status: Abnormal   Collection Time: 03/18/18 11:04 AM  Result Value Ref Range   Yeast Wet Prep HPF POC NONE SEEN NONE SEEN   Trich, Wet Prep NONE SEEN NONE SEEN   Clue Cells Wet Prep HPF POC NONE SEEN NONE SEEN   WBC, Wet Prep HPF POC MODERATE (A) NONE SEEN   Sperm NONE SEEN   Fetal fibronectin     Status: None   Collection Time: 03/18/18 11:04 AM  Result Value Ref Range   Fetal Fibronectin NEGATIVE NEGATIVE    Physical Exam  Constitutional: She is oriented to person, place, and time. She appears well-developed and well-nourished.  HENT:  Head: Normocephalic and atraumatic.  Eyes: Conjunctivae are normal.  Neck: Normal range of motion.  Cardiovascular: Normal rate, regular rhythm and normal heart sounds.  Respiratory: Effort normal and breath sounds normal.  GI: Soft. Bowel sounds are normal.  Genitourinary: Uterus is enlarged. Cervix exhibits discharge. Cervix exhibits no motion tenderness and no friability.    Vaginal discharge (Small amt in posterior fornix; thin milky white. ) present.     No vaginal tenderness or bleeding.  No tenderness or bleeding in the vagina.    Genitourinary Comments: Sterile Speculum Exam: -Vaginal Vault: Discharge -wet prep collected and fFN collected from PF -Cervix: Appears open; Small amt thick milky discharge from os-GC/CT collected -Bimanual  Exam: 1cm external os with Closed internal os/Long/Thick   Musculoskeletal: Normal range of motion.  Neurological: She is alert and oriented to person, place, and time.  Skin: Skin is warm and dry.  Psychiatric: She has a normal mood and affect. Her behavior is normal.     FHR:155 bpm, Mod Var, -Decels, +Accels UC: Irritability Graphed that resolved with position change ED Course  Assessment: IUP at 26.2wks Cat I FT Lower Abdominal Pain Back Pain  Plan: -PE as above -UA: Negative -Discussed how symptoms most likely related to recent fall. -Give Flexeril 5mg  now and reassess for improvement.  Follow Up (1100) -Patient reports no improvement in symptoms and onset of N/V -Patient questions if symptoms are related to GI history -Give Zofran 4mg  ODT now -Will perform pelvic exam -Wet prep, GC/CT, fFN pending -After review of chart decision to give tramadol 100mg  -Will reassess.   Follow Up (1220) -Results return negative. GC/CT Pending -Reports no change in pain level despite dosing -Discussed following up with GI doctor and scheduling and keeping ROB appt especially for completion of 28 week labs -Patient verbalizes understanding and has no further q/c. -Instructed to return to MAU for worsening symptoms or onset of new symptoms. -Message sent to admin team to schedule patient for [place] -Patient aware of F/U MFM [place] on 1/6. -Discharged to home in stable condition with preterm labor precautions.    [place], MSN 03/18/2018 9:45 AM

## 2018-03-18 NOTE — L&D Delivery Note (Signed)
Patient: Emma Chambers MRN: [id number]  GBS status: Negative, IAP given: None   Patient is a 28 y.o. now G4P3 s/p NSVD at [redacted]w[redacted]d, who was admitted for SOL. SROM 5h 15m prior to delivery with clear fluid.    Delivery Note At 11:52 AM a viable female was delivered via Vaginal, Spontaneous (Presentation: LOA).  APGAR: 9, 9; weight pending.   Placenta status: spontaneous, intact.  Cord: 3 vessel with the following complications: none.   Anesthesia:  Epidural  Episiotomy: None Lacerations: None Suture Repair: None  Est. Blood Loss (mL): 115   Head delivered LOA. No nuchal cord present. Shoulder and body delivered in usual fashion. Infant with spontaneous cry, placed on mother's abdomen, dried and bulb suctioned. Cord clamped x 2 after 1-minute delay, and cut by mother. Cord blood drawn. Placenta delivered spontaneously with gentle cord traction. Fundus firm with massage and Pitocin. Perineum inspected and found to have no lacerations.   Mom to postpartum.  Baby to Couplet care / Skin to Skin.  Melina Schools 06/21/2018, 12:20 PM

## 2018-03-18 NOTE — Discharge Instructions (Signed)
Abdominal Pain During Pregnancy ° °Abdominal pain is common during pregnancy, and has many possible causes. Some causes are more serious than others, and sometimes the cause is not known. Abdominal pain can be a sign that labor is starting. It can also be caused by normal growth and stretching of muscles and ligaments during pregnancy. Always tell your health care provider if you have any abdominal pain. °Follow these instructions at home: °· Do not have sex or put anything in your vagina until your pain goes away completely. °· Get plenty of rest until your pain improves. °· Drink enough fluid to keep your urine pale yellow. °· Take over-the-counter and prescription medicines only as told by your health care provider. °· Keep all follow-up visits as told by your health care provider. This is important. °Contact a health care provider if: °· Your pain continues or gets worse after resting. °· You have lower abdominal pain that: °? Comes and goes at regular intervals. °? Spreads to your back. °? Is similar to menstrual cramps. °· You have pain or burning when you urinate. °Get help right away if: °· You have a fever or chills. °· You have vaginal bleeding. °· You are leaking fluid from your vagina. °· You are passing tissue from your vagina. °· You have vomiting or diarrhea that lasts for more than 24 hours. °· Your baby is moving less than usual. °· You feel very weak or faint. °· You have shortness of breath. °· You develop severe pain in your upper abdomen. °Summary °· Abdominal pain is common during pregnancy, and has many possible causes. °· If you experience abdominal pain during pregnancy, tell your health care provider right away. °· Follow your health care provider's home care instructions and keep all follow-up visits as directed. °This information is not intended to replace advice given to you by your health care provider. Make sure you discuss any questions you have with your health care  provider. °Document Released: 03/04/2005 Document Revised: 06/06/2016 Document Reviewed: 06/06/2016 °Elsevier Interactive Patient Education © 2019 [employer] ° °

## 2018-03-19 ENCOUNTER — Telehealth: Payer: Self-pay | Admitting: Family Medicine

## 2018-03-19 NOTE — Telephone Encounter (Signed)
Spoke with patient about coming in on 01/06 for her OB appointment. She stated she would be here.

## 2018-03-20 LAB — GC/CHLAMYDIA PROBE AMP (~~LOC~~) NOT AT ARMC
Chlamydia: NEGATIVE
Neisseria Gonorrhea: NEGATIVE

## 2018-03-23 ENCOUNTER — Ambulatory Visit (INDEPENDENT_AMBULATORY_CARE_PROVIDER_SITE_OTHER): Payer: Medicaid Other | Admitting: Advanced Practice Midwife

## 2018-03-23 ENCOUNTER — Encounter (HOSPITAL_COMMUNITY): Payer: Self-pay

## 2018-03-23 ENCOUNTER — Encounter: Payer: Self-pay | Admitting: Advanced Practice Midwife

## 2018-03-23 ENCOUNTER — Other Ambulatory Visit (HOSPITAL_COMMUNITY): Payer: Self-pay | Admitting: *Deleted

## 2018-03-23 ENCOUNTER — Ambulatory Visit (HOSPITAL_COMMUNITY)
Admission: RE | Admit: 2018-03-23 | Discharge: 2018-03-23 | Disposition: A | Discharge status: Home / Self Care | Payer: Medicaid Other | Source: Ambulatory Visit | Attending: Advanced Practice Midwife | Admitting: Advanced Practice Midwife

## 2018-03-23 VITALS — BP 119/76 | HR 111 | Wt 218.0 lb

## 2018-03-23 DIAGNOSIS — O09212 Supervision of pregnancy with history of pre-term labor, second trimester: Secondary | ICD-10-CM

## 2018-03-23 DIAGNOSIS — Z362 Encounter for other antenatal screening follow-up: Secondary | ICD-10-CM

## 2018-03-23 DIAGNOSIS — Z3689 Encounter for other specified antenatal screening: Secondary | ICD-10-CM | POA: Insufficient documentation

## 2018-03-23 DIAGNOSIS — O09292 Supervision of pregnancy with other poor reproductive or obstetric history, second trimester: Secondary | ICD-10-CM | POA: Diagnosis not present

## 2018-03-23 DIAGNOSIS — O0933 Supervision of pregnancy with insufficient antenatal care, third trimester: Secondary | ICD-10-CM

## 2018-03-23 DIAGNOSIS — Z8759 Personal history of other complications of pregnancy, childbirth and the puerperium: Secondary | ICD-10-CM

## 2018-03-23 DIAGNOSIS — Z3482 Encounter for supervision of other normal pregnancy, second trimester: Secondary | ICD-10-CM

## 2018-03-23 DIAGNOSIS — O99212 Obesity complicating pregnancy, second trimester: Secondary | ICD-10-CM

## 2018-03-23 DIAGNOSIS — O0932 Supervision of pregnancy with insufficient antenatal care, second trimester: Secondary | ICD-10-CM

## 2018-03-23 DIAGNOSIS — Z3480 Encounter for supervision of other normal pregnancy, unspecified trimester: Secondary | ICD-10-CM

## 2018-03-23 DIAGNOSIS — Z3A27 27 weeks gestation of pregnancy: Secondary | ICD-10-CM

## 2018-03-23 NOTE — Progress Notes (Signed)
   PRENATAL VISIT NOTE  Subjective:  Emma Chambers is a 28 y.o. [phone] at [redacted]w[redacted]d being seen today for ongoing prenatal care.  She is currently monitored for the following issues for this low-risk pregnancy and has H. pylori infection; Generalized headaches; Pica in adults; Ruptured ectopic pregnancy; Supervision of other normal pregnancy, antepartum; and History of gestational hypertension on their problem list.  Patient reports no complaints.  Contractions: Irritability. Vag. Bleeding: None.  Movement: Present. Denies leaking of fluid.   The following portions of the patient's history were reviewed and updated as appropriate: allergies, current medications, past family history, past medical history, past social history, past surgical history and problem list. Problem list updated.  Objective:   Vitals:   03/23/18 1438  BP: 119/76  Pulse: (!) 111  Weight: 218 lb (98.9 kg)    Fetal Status: Fetal Heart Rate (bpm): 147 Fundal Height: 29 cm Movement: Present     General:  Alert, oriented and cooperative. Patient is in no acute distress.  Skin: Skin is warm and dry. No rash noted.   Cardiovascular: Normal heart rate noted  Respiratory: Normal respiratory effort, no problems with respiration noted  Abdomen: Soft, gravid, appropriate for gestational age.  Pain/Pressure: Present     Pelvic: Cervical exam deferred        Extremities: Normal range of motion.  Edema: None  Mental Status: Normal mood and affect. Normal behavior. Normal judgment and thought content.   Assessment and Plan:  Pregnancy: [id number] at [redacted]w[redacted]d  1. Supervision of other normal pregnancy, antepartum - Routine care  - CBC; Future - HIV Antibody (routine testing w rflx); Future - RPR; Future - Glucose Tolerance, 2 Hours w/1 Hour; Future - BTL papers today   2. Limited prenatal care in third trimester - Patient has not been seen since 11/2017. Was planning a 1 hour GTT today due to lack of visits, but patient states  that she will return for 2 hour GTT.    Preterm labor symptoms and general obstetric precautions including but not limited to vaginal bleeding, contractions, leaking of fluid and fetal movement were reviewed in detail with the patient. Please refer to After Visit Summary for other counseling recommendations.  Return in about 2 weeks (around 04/06/2018).  Future Appointments  Date Time Provider [employer] [facility]  04/20/2018  9:30 AM [facility] [place] 1 [facility]   Nya Monds DNP, CNM  03/23/18  3:07 PM

## 2018-03-23 NOTE — Patient Instructions (Addendum)
Third Trimester of Pregnancy The third trimester is from week 28 through week 40 (months 7 through 9). The third trimester is a time when the unborn baby (fetus) is growing rapidly. At the end of the ninth month, the fetus is about 20 inches in length and weighs 6-10 pounds. Body changes during your third trimester Your body will continue to go through many changes during pregnancy. The changes vary from woman to woman. During the third trimester:  Your weight will continue to increase. You can expect to gain 25-35 pounds (11-16 kg) by the end of the pregnancy.  You may begin to get stretch marks on your hips, abdomen, and breasts.  You may urinate more often because the fetus is moving lower into your pelvis and pressing on your bladder.  You may develop or continue to have heartburn. This is caused by increased hormones that slow down muscles in the digestive tract.  You may develop or continue to have constipation because increased hormones slow digestion and cause the muscles that push waste through your intestines to relax.  You may develop hemorrhoids. These are swollen veins (varicose veins) in the rectum that can itch or be painful.  You may develop swollen, bulging veins (varicose veins) in your legs.  You may have increased body aches in the pelvis, back, or thighs. This is due to weight gain and increased hormones that are relaxing your joints.  You may have changes in your hair. These can include thickening of your hair, rapid growth, and changes in texture. Some women also have hair loss during or after pregnancy, or hair that feels dry or thin. Your hair will most likely return to normal after your baby is born.  Your breasts will continue to grow and they will continue to become tender. A yellow fluid (colostrum) may leak from your breasts. This is the first milk you are producing for your baby.  Your belly button may stick out.  You may notice more swelling in your hands,  face, or ankles.  You may have increased tingling or numbness in your hands, arms, and legs. The skin on your belly may also feel numb.  You may feel short of breath because of your expanding uterus.  You may have more problems sleeping. This can be caused by the size of your belly, increased need to urinate, and an increase in your body's metabolism.  You may notice the fetus "dropping," or moving lower in your abdomen (lightening).  You may have increased vaginal discharge.  You may notice your joints feel loose and you may have pain around your pelvic bone. What to expect at prenatal visits You will have prenatal exams every 2 weeks until week 36. Then you will have weekly prenatal exams. During a routine prenatal visit:  You will be weighed to make sure you and the baby are growing normally.  Your blood pressure will be taken.  Your abdomen will be measured to track your baby's growth.  The fetal heartbeat will be listened to.  Any test results from the previous visit will be discussed.  You may have a cervical check near your due date to see if your cervix has softened or thinned (effaced).  You will be tested for Group B streptococcus. This happens between 35 and 37 weeks. Your health care provider may ask you:  What your birth plan is.  How you are feeling.  If you are feeling the baby move.  If you have had any abnormal   symptoms, such as leaking fluid, bleeding, severe headaches, or abdominal cramping.  If you are using any tobacco products, including cigarettes, chewing tobacco, and electronic cigarettes.  If you have any questions. Other tests or screenings that may be performed during your third trimester include:  Blood tests that check for low iron levels (anemia).  Fetal testing to check the health, activity level, and growth of the fetus. Testing is done if you have certain medical conditions or if there are problems during the pregnancy.  Nonstress test  (NST). This test checks the health of your baby to make sure there are no signs of problems, such as the baby not getting enough oxygen. During this test, a belt is placed around your belly. The baby is made to move, and its heart rate is monitored during movement. What is false labor? False labor is a condition in which you feel small, irregular tightenings of the muscles in the womb (contractions) that usually go away with rest, changing position, or drinking water. These are called Braxton Hicks contractions. Contractions may last for hours, days, or even weeks before true labor sets in. If contractions come at regular intervals, become more frequent, increase in intensity, or become painful, you should see your health care provider. What are the signs of labor?  Abdominal cramps.  Regular contractions that start at 10 minutes apart and become stronger and more frequent with time.  Contractions that start on the top of the uterus and spread down to the lower abdomen and back.  Increased pelvic pressure and dull back pain.  A watery or bloody mucus discharge that comes from the vagina.  Leaking of amniotic fluid. This is also known as your "water breaking." It could be a slow trickle or a gush. Let your health care provider know if it has a color or strange odor. If you have any of these signs, call your health care provider right away, even if it is before your due date. Follow these instructions at home: Medicines  Follow your health care provider's instructions regarding medicine use. Specific medicines may be either safe or unsafe to take during pregnancy.  Take a prenatal vitamin that contains at least 600 micrograms (mcg) of folic acid.  If you develop constipation, try taking a stool softener if your health care provider approves. Eating and drinking   Eat a balanced diet that includes fresh fruits and vegetables, whole grains, good sources of protein such as meat, eggs, or tofu,  and low-fat dairy. Your health care provider will help you determine the amount of weight gain that is right for you.  Avoid raw meat and uncooked cheese. These carry germs that can cause birth defects in the baby.  If you have low calcium intake from food, talk to your health care provider about whether you should take a daily calcium supplement.  Eat four or five small meals rather than three large meals a day.  Limit foods that are high in fat and processed sugars, such as fried and sweet foods.  To prevent constipation: ? Drink enough fluid to keep your urine clear or pale yellow. ? Eat foods that are high in fiber, such as fresh fruits and vegetables, whole grains, and beans. Activity  Exercise only as directed by your health care provider. Most women can continue their usual exercise routine during pregnancy. Try to exercise for 30 minutes at least 5 days a week. Stop exercising if you experience uterine contractions.  Avoid heavy lifting.  Do   not exercise in extreme heat or humidity, or at high altitudes.  Wear low-heel, comfortable shoes.  Practice good posture.  You may continue to have sex unless your health care provider tells you otherwise. Relieving pain and discomfort  Take frequent breaks and rest with your legs elevated if you have leg cramps or low back pain.  Take warm sitz baths to soothe any pain or discomfort caused by hemorrhoids. Use hemorrhoid cream if your health care provider approves.  Wear a good support bra to prevent discomfort from breast tenderness.  If you develop varicose veins: ? Wear support pantyhose or compression stockings as told by your healthcare provider. ? Elevate your feet for 15 minutes, 3-4 times a day. Prenatal care  Write down your questions. Take them to your prenatal visits.  Keep all your prenatal visits as told by your health care provider. This is important. Safety  Wear your seat belt at all times when driving.  Make  a list of emergency phone numbers, including numbers for family, friends, the hospital, and police and fire departments. General instructions  Avoid cat litter boxes and soil used by cats. These carry germs that can cause birth defects in the baby. If you have a cat, ask someone to clean the litter box for you.  Do not travel far distances unless it is absolutely necessary and only with the approval of your health care provider.  Do not use hot tubs, steam rooms, or saunas.  Do not drink alcohol.  Do not use any products that contain nicotine or tobacco, such as cigarettes and e-cigarettes. If you need help quitting, ask your health care provider.  Do not use any medicinal herbs or unprescribed drugs. These chemicals affect the formation and growth of the baby.  Do not douche or use tampons or scented sanitary pads.  Do not cross your legs for long periods of time.  To prepare for the arrival of your baby: ? Take prenatal classes to understand, practice, and ask questions about labor and delivery. ? Make a trial run to the hospital. ? Visit the hospital and tour the maternity area. ? Arrange for maternity or paternity leave through employers. ? Arrange for family and friends to take care of pets while you are in the hospital. ? Purchase a rear-facing car seat and make sure you know how to install it in your car. ? Pack your hospital bag. ? Prepare the baby's nursery. Make sure to remove all pillows and stuffed animals from the baby's crib to prevent suffocation.  Visit your dentist if you have not gone during your pregnancy. Use a soft toothbrush to brush your teeth and be gentle when you floss. Contact a health care provider if:  You are unsure if you are in labor or if your water has broken.  You become dizzy.  You have mild pelvic cramps, pelvic pressure, or nagging pain in your abdominal area.  You have lower back pain.  You have persistent nausea, vomiting, or  diarrhea.  You have an unusual or bad smelling vaginal discharge.  You have pain when you urinate. Get help right away if:  Your water breaks before 37 weeks.  You have regular contractions less than 5 minutes apart before 37 weeks.  You have a fever.  You are leaking fluid from your vagina.  You have spotting or bleeding from your vagina.  You have severe abdominal pain or cramping.  You have rapid weight loss or weight gain.  You have   shortness of breath with chest pain.  You notice sudden or extreme swelling of your face, hands, ankles, feet, or legs.  Your baby makes fewer than 10 movements in 2 hours.  You have severe headaches that do not go away when you take medicine.  You have vision changes. Summary  The third trimester is from week 28 through week 40, months 7 through 9. The third trimester is a time when the unborn baby (fetus) is growing rapidly.  During the third trimester, your discomfort may increase as you and your baby continue to gain weight. You may have abdominal, leg, and back pain, sleeping problems, and an increased need to urinate.  During the third trimester your breasts will keep growing and they will continue to become tender. A yellow fluid (colostrum) may leak from your breasts. This is the first milk you are producing for your baby.  False labor is a condition in which you feel small, irregular tightenings of the muscles in the womb (contractions) that eventually go away. These are called Braxton Hicks contractions. Contractions may last for hours, days, or even weeks before true labor sets in.  Signs of labor can include: abdominal cramps; regular contractions that start at 10 minutes apart and become stronger and more frequent with time; watery or bloody mucus discharge that comes from the vagina; increased pelvic pressure and dull back pain; and leaking of amniotic fluid. This information is not intended to replace advice given to you by your  health care provider. Make sure you discuss any questions you have with your health care provider. Document Released: 02/26/2001 Document Revised: 04/09/2016 Document Reviewed: 04/09/2016 Elsevier Interactive Patient Education  2019 [place] [address] [place], [place]  [address] Phone - [phone]   Fax - [phone]  [facility] [address] [place], [place] [address] Phone - [phone]   Fax - [place] [address] [place], [place]  [address] Phone - [phone]   Fax - [phone]  [place]. [address] [address] [place], [place]  [address] Phone - [phone]   Fax - [phone]  [place] [address] [place], [place]  [address] Phone - [phone]   Fax - [phone]  [facility] [address] [place], [place]  [address] Phone - [phone]   Fax - [place] [address], [place], [place]  [address] Phone - [phone]   Fax - [phone]  [place] [address] [place], [place] [address] [place], [place]  [address] Phone - [phone]   Fax - [place] [address] [place], [place]  [address] Phone - [phone]   Fax - [phone] [facility] [place]. [address] [place], [place]  [address] Phone - [phone]   Fax - [phone]  [place] [address] [place], [place]  [address] Phone - [phone]   Fax - [phone]  [facility] [place], [place], [place]  [address] Phone - [phone]   Fax - [place] [address] [place], [place]  [address] Phone - [phone]   Fax -  [phone]  [facility] [address] [place], [place]  [address] Phone -  [place], [place]  [address] Phone - [phone]   Fax - [place] [address], [place], [place]  [address] Phone - [phone]   Fax - [phone]  [place] [address] [place], [place]  [address] Phone - [phone]   Fax - [place], [place]  [address] Phone - [phone]   Fax - [place], [place], [place]  [address] Phone - [phone]   Fax - [place] [address] [place], [place]  [address] Phone - [phone]   Fax - [phone]  [facility] [address] [place], [place]  [address] Phone - [phone]   Fax - [place] [place], [place]  [address] Phone - [phone]   Fax - [phone]  [place] [address] [place], [place]  [address] Phone - [phone]   Fax - [phone] [place] [place] [address] [place], [place]  [address] Phone - [phone]   Fax - [place] [address] [place], [place]  [address] Phone - [phone]   Fax - [place] [address], [place], [place]  [address] Phone - [phone]   Fax - [phone]  [place] MD [address] [place] [address] Phone [phone]  Fax [phone]

## 2018-03-24 ENCOUNTER — Ambulatory Visit (INDEPENDENT_AMBULATORY_CARE_PROVIDER_SITE_OTHER): Payer: Medicaid Other | Admitting: Obstetrics & Gynecology

## 2018-03-24 ENCOUNTER — Other Ambulatory Visit: Payer: Medicaid Other

## 2018-03-24 VITALS — BP 120/65 | HR 95 | Wt 213.7 lb

## 2018-03-24 DIAGNOSIS — Z3482 Encounter for supervision of other normal pregnancy, second trimester: Secondary | ICD-10-CM

## 2018-03-24 DIAGNOSIS — Z3480 Encounter for supervision of other normal pregnancy, unspecified trimester: Secondary | ICD-10-CM

## 2018-03-24 DIAGNOSIS — O99212 Obesity complicating pregnancy, second trimester: Secondary | ICD-10-CM

## 2018-03-24 DIAGNOSIS — O99210 Obesity complicating pregnancy, unspecified trimester: Secondary | ICD-10-CM

## 2018-03-24 HISTORY — DX: Obesity complicating pregnancy, unspecified trimester: O99.210

## 2018-03-24 LAB — POCT URINALYSIS DIP (DEVICE)
Bilirubin Urine: NEGATIVE
Glucose, UA: NEGATIVE mg/dL
Hgb urine dipstick: NEGATIVE
Ketones, ur: NEGATIVE mg/dL
Leukocytes, UA: NEGATIVE
Nitrite: NEGATIVE
Protein, ur: 30 mg/dL — AB
Specific Gravity, Urine: 1.025 (ref 1.005–1.030)
Urobilinogen, UA: 0.2 mg/dL (ref 0.0–1.0)
pH: 7 (ref 5.0–8.0)

## 2018-03-24 MED ORDER — PANTOPRAZOLE SODIUM 20 MG PO TBEC: 20.0000 mg | DELAYED_RELEASE_TABLET | Freq: Two times a day (BID) | ORAL | 6 refills | Status: DC

## 2018-03-24 MED ORDER — METOCLOPRAMIDE HCL 10 MG PO TABS: 10.0000 mg | ORAL_TABLET | Freq: Three times a day (TID) | ORAL | 2 refills | Status: DC

## 2018-03-24 NOTE — Addendum Note (Signed)
Addended by: Emily Filbert on: 03/24/2018 10:46 AM   Modules accepted: Orders

## 2018-03-24 NOTE — Progress Notes (Addendum)
   PRENATAL VISIT NOTE  Subjective:  Emma Chambers is a 28 y.o. [phone] at [redacted]w[redacted]d being seen today for ongoing prenatal care.  She is currently monitored for the following issues for this high-risk pregnancy and has H. pylori infection; Generalized headaches; Pica in adults; Ruptured ectopic pregnancy; Supervision of other normal pregnancy, antepartum; History of gestational hypertension; and Obesity in pregnancy on their problem list.  Patient reports vomiting "every day since I've been pregnant". She reports that she can keep liquids down. This episode today occurred with the administration of her 2 hour GTT. She has tried zofran, phenergan, and vitamin B6. None of these have worked for her.   .  .   . Denies leaking of fluid.   The following portions of the patient's history were reviewed and updated as appropriate: allergies, current medications, past family history, past medical history, past social history, past surgical history and problem list. Problem list updated.  Objective:  There were no vitals filed for this visit.  Fetal Status:           General:  Alert, oriented and cooperative. Patient is in no acute distress.  Skin: Skin is warm and dry. No rash noted.   Cardiovascular: Normal heart rate noted  Respiratory: Normal respiratory effort, no problems with respiration noted  Abdomen: Soft, gravid, appropriate for gestational age.        Pelvic: Cervical exam deferred        Extremities: Normal range of motion.     Mental Status: Normal mood and affect. Normal behavior. Normal judgment and thought content.   Assessment and Plan:  Pregnancy: [id number] at [redacted]w[redacted]d  1. Obesity in pregnancy - monthly growth u/s, most recent yesterday 2. vomitting (most recently today with her 2 hour GTT)- Her ketones are negative.  I have started her on reglan 10 mg TID and protonix 20 mg BID She will come back for a weight check tomorrow as well as a HBA1C.  Preterm labor symptoms and general  obstetric precautions including but not limited to vaginal bleeding, contractions, leaking of fluid and fetal movement were reviewed in detail with the patient. Please refer to After Visit Summary for other counseling recommendations.  No follow-ups on file.  Future Appointments  Date Time Provider [place]  04/06/2018  1:55 PM Jorje Guild, NP [place]  04/20/2018  9:30 AM [facility]FC [place] 1 [facility]-MFCUS M[facility]-US    Emily Filbert, MD

## 2018-03-25 ENCOUNTER — Encounter (HOSPITAL_COMMUNITY): Payer: Self-pay

## 2018-03-25 ENCOUNTER — Observation Stay (HOSPITAL_COMMUNITY)
Admission: AD | Admit: 2018-03-25 | Discharge: 2018-03-26 | Disposition: A | Discharge status: Home / Self Care | Payer: Medicaid Other | Source: Ambulatory Visit | Attending: Obstetrics and Gynecology | Admitting: Obstetrics and Gynecology

## 2018-03-25 ENCOUNTER — Ambulatory Visit (INDEPENDENT_AMBULATORY_CARE_PROVIDER_SITE_OTHER): Payer: Medicaid Other | Admitting: General Practice

## 2018-03-25 ENCOUNTER — Other Ambulatory Visit: Payer: Self-pay

## 2018-03-25 VITALS — BP 120/63 | HR 96 | Ht 61.0 in | Wt 209.9 lb

## 2018-03-25 DIAGNOSIS — E860 Dehydration: Secondary | ICD-10-CM | POA: Diagnosis not present

## 2018-03-25 DIAGNOSIS — O211 Hyperemesis gravidarum with metabolic disturbance: Secondary | ICD-10-CM | POA: Diagnosis not present

## 2018-03-25 DIAGNOSIS — Z3A27 27 weeks gestation of pregnancy: Secondary | ICD-10-CM

## 2018-03-25 DIAGNOSIS — Z9189 Other specified personal risk factors, not elsewhere classified: Secondary | ICD-10-CM

## 2018-03-25 DIAGNOSIS — Z87891 Personal history of nicotine dependence: Secondary | ICD-10-CM | POA: Insufficient documentation

## 2018-03-25 DIAGNOSIS — R634 Abnormal weight loss: Secondary | ICD-10-CM | POA: Insufficient documentation

## 2018-03-25 DIAGNOSIS — O2612 Low weight gain in pregnancy, second trimester: Secondary | ICD-10-CM | POA: Diagnosis not present

## 2018-03-25 DIAGNOSIS — O210 Mild hyperemesis gravidarum: Secondary | ICD-10-CM | POA: Diagnosis present

## 2018-03-25 DIAGNOSIS — O99210 Obesity complicating pregnancy, unspecified trimester: Secondary | ICD-10-CM

## 2018-03-25 DIAGNOSIS — O212 Late vomiting of pregnancy: Secondary | ICD-10-CM | POA: Diagnosis present

## 2018-03-25 LAB — COMPREHENSIVE METABOLIC PANEL
ALT: 8 U/L (ref 0–44)
AST: 16 U/L (ref 15–41)
Albumin: 3.2 g/dL — ABNORMAL LOW (ref 3.5–5.0)
Alkaline Phosphatase: 61 U/L (ref 38–126)
Anion gap: 8 (ref 5–15)
BUN: 6 mg/dL (ref 6–20)
CO2: 20 mmol/L — ABNORMAL LOW (ref 22–32)
Calcium: 8.6 mg/dL — ABNORMAL LOW (ref 8.9–10.3)
Chloride: 107 mmol/L (ref 98–111)
Creatinine, Ser: 0.44 mg/dL (ref 0.44–1.00)
GFR calc Af Amer: >60 mL/min (ref >60)
GFR calc non Af Amer: >60 mL/min (ref >60)
Glucose, Bld: 71 mg/dL (ref 70–99)
Potassium: 3.6 mmol/L (ref 3.5–5.1)
Sodium: 135 mmol/L (ref 135–145)
Total Bilirubin: 0.1 mg/dL — ABNORMAL LOW (ref 0.3–1.2)
Total Protein: 6.6 g/dL (ref 6.5–8.1)

## 2018-03-25 LAB — URINALYSIS, ROUTINE W REFLEX MICROSCOPIC
Bilirubin Urine: NEGATIVE
Glucose, UA: NEGATIVE mg/dL
Hgb urine dipstick: NEGATIVE
Ketones, ur: 20 mg/dL — AB
Leukocytes, UA: NEGATIVE
Nitrite: NEGATIVE
Protein, ur: NEGATIVE mg/dL
Specific Gravity, Urine: 1.006 (ref 1.005–1.030)
pH: 7 (ref 5.0–8.0)

## 2018-03-25 LAB — TSH: TSH: 1.326 u[IU]/mL (ref 0.350–4.500)

## 2018-03-25 LAB — CBC
HCT: 32.3 % — ABNORMAL LOW (ref 36.0–46.0)
Hemoglobin: 10.5 g/dL — ABNORMAL LOW (ref 12.0–15.0)
MCH: 28.2 pg (ref 26.0–34.0)
MCHC: 32.5 g/dL (ref 30.0–36.0)
MCV: 86.6 fL (ref 80.0–100.0)
Platelets: 283 10*3/uL (ref 150–400)
RBC: 3.73 MIL/uL — ABNORMAL LOW (ref 3.87–5.11)
RDW: 13.8 % (ref 11.5–15.5)
WBC: 9.3 10*3/uL (ref 4.0–10.5)
nRBC: 0 % (ref 0.0–0.2)

## 2018-03-25 LAB — HEMOGLOBIN A1C
Est. average glucose Bld gHb Est-mCnc: 97 mg/dL
Hgb A1c MFr Bld: 5 % (ref 4.8–5.6)

## 2018-03-25 LAB — MAGNESIUM: Magnesium: 2 mg/dL (ref 1.7–2.4)

## 2018-03-25 MED ORDER — ZOLPIDEM TARTRATE 5 MG PO TABS: 5.0000 mg | ORAL_TABLET | Freq: Every evening | ORAL | Status: DC | PRN

## 2018-03-25 MED ORDER — SCOPOLAMINE 1 MG/3DAYS TD PT72
1.0000 | MEDICATED_PATCH | Freq: Once | TRANSDERMAL | Status: DC
  Administered 2018-03-25: 1.5 mg via TRANSDERMAL
  Filled 2018-03-25: qty 1

## 2018-03-25 MED ORDER — LACTATED RINGERS IV BOLUS
1000.0000 mL | Freq: Once | INTRAVENOUS | Status: AC
  Administered 2018-03-25: 1000 mL via INTRAVENOUS

## 2018-03-25 MED ORDER — HYDROMORPHONE HCL 1 MG/ML IJ SOLN
1.0000 mg | Freq: Once | INTRAMUSCULAR | Status: AC
  Administered 2018-03-25: 1 mg via INTRAVENOUS
  Filled 2018-03-25: qty 1

## 2018-03-25 MED ORDER — PRENATAL MULTIVITAMIN CH: 1.0000 | ORAL_TABLET | Freq: Every day | ORAL | Status: DC

## 2018-03-25 MED ORDER — FAMOTIDINE IN NACL 20-0.9 MG/50ML-% IV SOLN
20.0000 mg | Freq: Two times a day (BID) | INTRAVENOUS | Status: DC
  Administered 2018-03-25 – 2018-03-26 (×2): 20 mg via INTRAVENOUS
  Filled 2018-03-25 (×3): qty 50

## 2018-03-25 MED ORDER — CALCIUM CARBONATE ANTACID 500 MG PO CHEW: 2.0000 | CHEWABLE_TABLET | ORAL | Status: DC | PRN

## 2018-03-25 MED ORDER — PROMETHAZINE HCL 25 MG/ML IJ SOLN
25.0000 mg | Freq: Four times a day (QID) | INTRAMUSCULAR | Status: DC
  Administered 2018-03-25 – 2018-03-26 (×4): 25 mg via INTRAVENOUS
  Filled 2018-03-25 (×4): qty 1

## 2018-03-25 MED ORDER — ONDANSETRON HCL 4 MG/2ML IJ SOLN
4.0000 mg | Freq: Four times a day (QID) | INTRAMUSCULAR | Status: DC
  Administered 2018-03-25 – 2018-03-26 (×4): 4 mg via INTRAVENOUS
  Filled 2018-03-25 (×4): qty 2

## 2018-03-25 MED ORDER — FAMOTIDINE IN NACL 20-0.9 MG/50ML-% IV SOLN
20.0000 mg | Freq: Once | INTRAVENOUS | Status: AC
  Administered 2018-03-25: 20 mg via INTRAVENOUS
  Filled 2018-03-25: qty 50

## 2018-03-25 MED ORDER — LACTATED RINGERS IV SOLN
INTRAVENOUS | Status: DC
  Administered 2018-03-25 – 2018-03-26 (×3): via INTRAVENOUS

## 2018-03-25 MED ORDER — M.V.I. ADULT IV INJ
Freq: Once | INTRAVENOUS | Status: AC
  Administered 2018-03-25: 12:00:00 via INTRAVENOUS
  Filled 2018-03-25: qty 10

## 2018-03-25 MED ORDER — DIPHENHYDRAMINE HCL 25 MG PO CAPS
25.0000 mg | ORAL_CAPSULE | Freq: Once | ORAL | Status: AC
  Administered 2018-03-25: 25 mg via ORAL
  Filled 2018-03-25: qty 1

## 2018-03-25 MED ORDER — ACETAMINOPHEN 325 MG PO TABS
650.0000 mg | ORAL_TABLET | ORAL | Status: DC | PRN
  Administered 2018-03-25: 650 mg via ORAL
  Filled 2018-03-25: qty 2

## 2018-03-25 MED ORDER — SODIUM CHLORIDE 0.9 % IV SOLN
25.0000 mg | Freq: Once | INTRAVENOUS | Status: AC
  Administered 2018-03-25: 25 mg via INTRAVENOUS
  Filled 2018-03-25: qty 1

## 2018-03-25 MED ORDER — SODIUM CHLORIDE 0.9 % IV SOLN
8.0000 mg | Freq: Once | INTRAVENOUS | Status: AC
  Administered 2018-03-25: 8 mg via INTRAVENOUS
  Filled 2018-03-25: qty 4

## 2018-03-25 MED ORDER — SCOPOLAMINE 1 MG/3DAYS TD PT72: 1.0000 | MEDICATED_PATCH | TRANSDERMAL | Status: DC

## 2018-03-25 MED ORDER — DOCUSATE SODIUM 100 MG PO CAPS
100.0000 mg | ORAL_CAPSULE | Freq: Every day | ORAL | Status: DC
  Administered 2018-03-26: 100 mg via ORAL
  Filled 2018-03-25: qty 1

## 2018-03-25 NOTE — Progress Notes (Signed)
After ordered IVF, pt con't to spit & vomiting small amt of bile colored liquid.  Pt has continued to c/o of nausea that has not been relieved. Pt also c/o increased cramping.  toco applied, will notify provider & con't to monitor.

## 2018-03-25 NOTE — Progress Notes (Signed)
Pt having HA and cramping pain rated 9/10. Tylenol not touching the pain. Called Dr. Rip Harbour to request stronger pain medication, he ordered 1 mg dilaudid IV one time dose. Informed him of dilaudid allergy, reaction is itching. Instructed to give it anyway. Discussed this with pharmacist as well.  Nicholes Calamity, RN

## 2018-03-25 NOTE — H&P (Signed)
HPI: Emma Chambers is a 28 y.o. [id number] at [redacted]w[redacted]d who presented to maternity admissions reporting nausea & vomiting. This has been a continued issue throughout the pregnancy but has worsened over the last few days. States she has vomited 4+ times per day and has been unable to keep down foods/fluids/meds. Was started on reglan & protonix yesterday by Dr. Hulan Fray. Took first dose of those meds this morning but vomited within 30 minutes of taking. She reports that she has lost 9 lbs in the last 2 days.  Denies fever/chills, heartburn, epigastric pain, or diarrhea. Continues to have abdominal cramping which is not new for her. Denies LOF or vaginal bleeding. Normal fetal movement.   While in MAU, received IV fluids & meds (phenergan, zofran, scop patch, & pepcid). Pt continued to vomit. Has documented 8 lb weight loss.       Past Medical History:  Diagnosis Date  . Anemia   . Gallstones   . H. pylori infection   . Headache(784.0)    chronic  . Kidney stones   . Ovarian cyst   . Pregnancy induced hypertension   . UTI (lower urinary tract infection)                    OB History  Gravida Para Term Preterm AB Living  4 2 1 1 1 2   SAB TAB Ectopic Multiple Live Births     0 0 1 0 2       # Outcome Date GA Lbr Len/2nd Weight Sex Delivery Anes PTL Lv  4 Current           3 Ectopic 2016          2 Preterm 06/29/12 [redacted]w[redacted]d 10:57 / 00:16 2568 g M Vag-Spont EPI  LIV  1 Term 05/20/11 [redacted]w[redacted]d 22:02 / 00:26 3675 g F Vag-Spont EPI  LIV     Birth Comments: none     Complications: Gestational hypertension    Obstetric Comments  2014: IOL at 65 weeks 2/2 gallstones  2013: GHTN  2016: ectopic pregnancy: Had methotrexate and then had surgery         Past Surgical History:  Procedure Laterality Date  . CHOLECYSTECTOMY N/A 07/27/2012   Procedure: LAPAROSCOPIC CHOLECYSTECTOMY WITH INTRAOPERATIVE CHOLANGIOGRAM;  Surgeon: Madilyn Hook, DO;  Location: [facility];  Service:  General;  Laterality: N/A;  . DIAGNOSTIC LAPAROSCOPY WITH REMOVAL OF ECTOPIC PREGNANCY N/A 12/27/2014   Procedure: DIAGNOSTIC LAPAROSCOPY, Right Salpingectomy with removal ECTOPIC PREGNANCY;  Surgeon: [name], MD;  Location: [facility];  Service: Gynecology;  Laterality: N/A;  . LAPAROSCOPIC APPENDECTOMY N/A 12/09/2012   Procedure: APPENDECTOMY LAPAROSCOPIC;  Surgeon: Gwenyth Ober, MD;  Location: [facility];  Service: General;  Laterality: N/A;        Family History  Problem Relation Age of Onset  . Asthma Mother   . Hypertension Mother   . Parkinsonism Mother   . Asthma Brother   . Diabetes Maternal Grandmother   . Hypertension Maternal Grandmother   . Stroke Maternal Grandmother   . Heart disease Maternal Grandmother   . Lupus Maternal Aunt   . Heart disease Maternal Grandfather   . Anesthesia problems Neg Hx   . Malignant hyperthermia Neg Hx   . Pseudochol deficiency Neg Hx   . Hypotension Neg Hx    Social History        Tobacco Use  . Smoking status: Former Smoker    Types: Cigarettes  . Smokeless tobacco:  Never Used  . Tobacco comment: 2014  Substance Use Topics  . Alcohol use: Not Currently    Comment: social  . Drug use: No        Allergies  Allergen Reactions  . Amoxicillin Nausea And Vomiting and Other (See Comments)    Tics & twitches Has patient had a PCN reaction causing immediate rash, facial/tongue/throat swelling, SOB or lightheadedness with hypotension: Yes Has patient had a PCN reaction causing severe rash involving mucus membranes or skin necrosis: No Has patient had a PCN reaction that required hospitalization No Has patient had a PCN reaction occurring within the last 10 years: Yes If all of the above answers are "NO", then may proceed with Cephalosporin use.  . Contrast Media [Iodinated Diagnostic Agents] Hives  . Dilaudid [Hydromorphone Hcl] Itching          Medications Prior to Admission  Medication Sig Dispense Refill  Last Dose  . acetaminophen (TYLENOL) 325 MG tablet Take 650 mg by mouth every 6 (six) hours as needed for headache.   03/25/2018 at Unknown time  . aspirin EC 81 MG tablet Take 1 tablet (81 mg total) by mouth daily. 30 tablet 11 03/24/2018 at Unknown time  . metoCLOPramide (REGLAN) 10 MG tablet Take 1 tablet (10 mg total) by mouth 3 (three) times daily before meals. 90 tablet 2 03/24/2018 at Unknown time  . pantoprazole (PROTONIX) 20 MG tablet Take 1 tablet (20 mg total) by mouth 2 (two) times daily. 60 tablet 6 03/25/2018 at Unknown time  . prenatal vitamin w/FE, FA (PRENATAL 1 + 1) 27-1 MG TABS tablet Take 1 tablet by mouth daily at 12 noon. 30 each 11 03/25/2018 at Unknown time  . Elastic Bandages & Supports (COMFORT FIT MATERNITY SUPP LG) MISC 1 Units by Does not apply route daily. (Patient not taking: Reported on 03/24/2018) 1 each 0 Not Taking    I have reviewed patient's Past Medical Hx, Surgical Hx, Family Hx, Social Hx, medications and allergies.   ROS:  Review of Systems  Constitutional: Positive for fatigue. Negative for chills and fever.  Gastrointestinal: Positive for abdominal pain, nausea and vomiting. Negative for constipation and diarrhea.  Genitourinary: Negative.     Physical Exam   Patient Vitals for the past 24 hrs:  BP Temp Temp src Pulse Resp SpO2 Height Weight  03/25/18 1536 - 98.4 F (36.9 C) Oral - - - - -  03/25/18 1006 109/64 98.7 F (37.1 C) Oral 99 16 98 % 5\' 1"  (1.549 m) 96.5 kg    Constitutional: Well-developed, well-nourished female in no acute distress.  Cardiovascular: normal rate & rhythm, no murmur Respiratory: normal effort, lung sounds clear throughout GI: Abd soft, non-tender, gravid appropriate for gestational age. Pos BS x 4 MS: Extremities nontender, no edema, normal ROM Neurologic: Alert and oriented x 4.   NST:  Baseline: 150 bpm, Variability: Good {> 6 bpm), Accelerations: Non-reactive but appropriate for gestational age and  Decelerations: Absent   Labs: LabResultsLast24Hours       Results for orders placed or performed during the hospital encounter of 03/25/18 (from the past 24 hour(s))  CBC     Status: Abnormal   Collection Time: 03/25/18 10:46 AM  Result Value Ref Range   WBC 9.3 4.0 - 10.5 K/uL   RBC 3.73 (L) 3.87 - 5.11 MIL/uL   Hemoglobin 10.5 (L) 12.0 - 15.0 g/dL   HCT 32.3 (L) 36.0 - 46.0 %   MCV 86.6 80.0 - 100.0 fL  MCH 28.2 26.0 - 34.0 pg   MCHC 32.5 30.0 - 36.0 g/dL   RDW 13.8 11.5 - 15.5 %   Platelets 283 150 - 400 K/uL   nRBC 0.0 0.0 - 0.2 %  Comprehensive metabolic panel     Status: Abnormal   Collection Time: 03/25/18 10:46 AM  Result Value Ref Range   Sodium 135 135 - 145 mmol/L   Potassium 3.6 3.5 - 5.1 mmol/L   Chloride 107 98 - 111 mmol/L   CO2 20 (L) 22 - 32 mmol/L   Glucose, Bld 71 70 - 99 mg/dL   BUN 6 6 - 20 mg/dL   Creatinine, Ser 0.44 0.44 - 1.00 mg/dL   Calcium 8.6 (L) 8.9 - 10.3 mg/dL   Total Protein 6.6 6.5 - 8.1 g/dL   Albumin 3.2 (L) 3.5 - 5.0 g/dL   AST 16 15 - 41 U/L   ALT 8 0 - 44 U/L   Alkaline Phosphatase 61 38 - 126 U/L   Total Bilirubin 0.1 (L) 0.3 - 1.2 mg/dL   GFR calc non Af Amer >60 >60 mL/min   GFR calc Af Amer >60 >60 mL/min   Anion gap 8 5 - 15  TSH     Status: None   Collection Time: 03/25/18 10:46 AM  Result Value Ref Range   TSH 1.326 0.350 - 4.500 uIU/mL  Magnesium     Status: None   Collection Time: 03/25/18 10:46 AM  Result Value Ref Range   Magnesium 2.0 1.7 - 2.4 mg/dL  Urinalysis, Routine w reflex microscopic     Status: Abnormal   Collection Time: 03/25/18 12:37 PM  Result Value Ref Range   Color, Urine STRAW (A) YELLOW   APPearance CLEAR CLEAR   Specific Gravity, Urine 1.006 1.005 - 1.030   pH 7.0 5.0 - 8.0   Glucose, UA NEGATIVE NEGATIVE mg/dL   Hgb urine dipstick NEGATIVE NEGATIVE   Bilirubin Urine NEGATIVE NEGATIVE   Ketones, ur 20 (A) NEGATIVE mg/dL   Protein, ur NEGATIVE  NEGATIVE mg/dL   Nitrite NEGATIVE NEGATIVE   Leukocytes, UA NEGATIVE NEGATIVE       Assessment: 1. Hyperemesis gravidarum, antepartum   2. [redacted] weeks gestation of pregnancy     Plan: Place in obs on [facility] unit IV fluids Scheduled phenergan, zofran, & pepcid Advance diet as tolerated NST Q shift  Jorje Guild, NP 03/25/2018 5:31 PM

## 2018-03-25 NOTE — MAU Provider Note (Signed)
Chief Complaint:  Emesis   First Provider Initiated Contact with Patient 03/25/18 1028     HPI: Emma Chambers is a 28 y.o. [id number] at [redacted]w[redacted]d who presents to maternity admissions reporting nausea & vomiting. This has been a continued issue throughout the pregnancy but has worsened over the last few days. States she has vomited 4+ times per day and has been unable to keep down foods/fluids/meds. Was started on reglan & protonix yesterday by Dr. Hulan Fray. Took first dose of those meds this morning but vomited within 30 minutes of taking. She reports that she has lost 9 lbs in the last 2 days.  Denies fever/chills, heartburn, epigastric pain, or diarrhea. Continues to have abdominal cramping which is not new for her. Denies LOF or vaginal bleeding. Normal fetal movement.   Past Medical History:  Diagnosis Date  . Anemia   . Gallstones   . H. pylori infection   . Headache(784.0)    chronic  . Kidney stones   . Ovarian cyst   . Pregnancy induced hypertension   . UTI (lower urinary tract infection)    OB History  Gravida Para Term Preterm AB Living  4 2 1 1 1 2   SAB TAB Ectopic Multiple Live Births  0 0 1 0 2    # Outcome Date GA Lbr Len/2nd Weight Sex Delivery Anes PTL Lv  4 Current           3 Ectopic 2016          2 Preterm 06/29/12 [redacted]w[redacted]d 10:57 / 00:16 2568 g M Vag-Spont EPI  LIV  1 Term 05/20/11 [redacted]w[redacted]d 22:02 / 00:26 3675 g F Vag-Spont EPI  LIV     Birth Comments: none     Complications: Gestational hypertension    Obstetric Comments  2014: IOL at 70 weeks 2/2 gallstones  2013: GHTN  2016: ectopic pregnancy: Had methotrexate and then had surgery    Past Surgical History:  Procedure Laterality Date  . CHOLECYSTECTOMY N/A 07/27/2012   Procedure: LAPAROSCOPIC CHOLECYSTECTOMY WITH INTRAOPERATIVE CHOLANGIOGRAM;  Surgeon: Madilyn Hook, DO;  Location: [facility];  Service: General;  Laterality: N/A;  . DIAGNOSTIC LAPAROSCOPY WITH REMOVAL OF ECTOPIC PREGNANCY N/A 12/27/2014   Procedure:  DIAGNOSTIC LAPAROSCOPY, Right Salpingectomy with removal ECTOPIC PREGNANCY;  Surgeon: [name], MD;  Location: [facility];  Service: Gynecology;  Laterality: N/A;  . LAPAROSCOPIC APPENDECTOMY N/A 12/09/2012   Procedure: APPENDECTOMY LAPAROSCOPIC;  Surgeon: Gwenyth Ober, MD;  Location: [facility];  Service: General;  Laterality: N/A;   Family History  Problem Relation Age of Onset  . Asthma Mother   . Hypertension Mother   . Parkinsonism Mother   . Asthma Brother   . Diabetes Maternal Grandmother   . Hypertension Maternal Grandmother   . Stroke Maternal Grandmother   . Heart disease Maternal Grandmother   . Lupus Maternal Aunt   . Heart disease Maternal Grandfather   . Anesthesia problems Neg Hx   . Malignant hyperthermia Neg Hx   . Pseudochol deficiency Neg Hx   . Hypotension Neg Hx    Social History   Tobacco Use  . Smoking status: Former Smoker    Types: Cigarettes  . Smokeless tobacco: Never Used  . Tobacco comment: 2014  Substance Use Topics  . Alcohol use: Not Currently    Comment: social  . Drug use: No   Allergies  Allergen Reactions  . Amoxicillin Nausea And Vomiting and Other (See Comments)    Tics & twitches  Has patient had a PCN reaction causing immediate rash, facial/tongue/throat swelling, SOB or lightheadedness with hypotension: Yes Has patient had a PCN reaction causing severe rash involving mucus membranes or skin necrosis: No Has patient had a PCN reaction that required hospitalization No Has patient had a PCN reaction occurring within the last 10 years: Yes If all of the above answers are "NO", then may proceed with Cephalosporin use.  . Contrast Media [Iodinated Diagnostic Agents] Hives  . Dilaudid [Hydromorphone Hcl] Itching   Medications Prior to Admission  Medication Sig Dispense Refill Last Dose  . acetaminophen (TYLENOL) 325 MG tablet Take 650 mg by mouth every 6 (six) hours as needed for headache.   03/25/2018 at Unknown time  . aspirin EC 81 MG  tablet Take 1 tablet (81 mg total) by mouth daily. 30 tablet 11 03/24/2018 at Unknown time  . metoCLOPramide (REGLAN) 10 MG tablet Take 1 tablet (10 mg total) by mouth 3 (three) times daily before meals. 90 tablet 2 03/24/2018 at Unknown time  . pantoprazole (PROTONIX) 20 MG tablet Take 1 tablet (20 mg total) by mouth 2 (two) times daily. 60 tablet 6 03/25/2018 at Unknown time  . prenatal vitamin w/FE, FA (PRENATAL 1 + 1) 27-1 MG TABS tablet Take 1 tablet by mouth daily at 12 noon. 30 each 11 03/25/2018 at Unknown time  . Elastic Bandages & Supports (COMFORT FIT MATERNITY SUPP LG) MISC 1 Units by Does not apply route daily. (Patient not taking: Reported on 03/24/2018) 1 each 0 Not Taking    I have reviewed patient's Past Medical Hx, Surgical Hx, Family Hx, Social Hx, medications and allergies.   ROS:  Review of Systems  Constitutional: Positive for fatigue. Negative for chills and fever.  Gastrointestinal: Positive for abdominal pain, nausea and vomiting. Negative for constipation and diarrhea.  Genitourinary: Negative.     Physical Exam   Patient Vitals for the past 24 hrs:  BP Temp Temp src Pulse Resp SpO2 Height Weight  03/25/18 1536 - 98.4 F (36.9 C) Oral - - - - -  03/25/18 1006 109/64 98.7 F (37.1 C) Oral 99 16 98 % 5\' 1"  (1.549 m) 96.5 kg    Constitutional: Well-developed, well-nourished female in no acute distress.  Cardiovascular: normal rate & rhythm, no murmur Respiratory: normal effort, lung sounds clear throughout GI: Abd soft, non-tender, gravid appropriate for gestational age. Pos BS x 4 MS: Extremities nontender, no edema, normal ROM Neurologic: Alert and oriented x 4.   NST:  Baseline: 150 bpm, Variability: Good {> 6 bpm), Accelerations: Non-reactive but appropriate for gestational age and Decelerations: Absent   Labs: Results for orders placed or performed during the hospital encounter of 03/25/18 (from the past 24 hour(s))  CBC     Status: Abnormal   Collection Time:  03/25/18 10:46 AM  Result Value Ref Range   WBC 9.3 4.0 - 10.5 K/uL   RBC 3.73 (L) 3.87 - 5.11 MIL/uL   Hemoglobin 10.5 (L) 12.0 - 15.0 g/dL   HCT 32.3 (L) 36.0 - 46.0 %   MCV 86.6 80.0 - 100.0 fL   MCH 28.2 26.0 - 34.0 pg   MCHC 32.5 30.0 - 36.0 g/dL   RDW 13.8 11.5 - 15.5 %   Platelets 283 150 - 400 K/uL   nRBC 0.0 0.0 - 0.2 %  Comprehensive metabolic panel     Status: Abnormal   Collection Time: 03/25/18 10:46 AM  Result Value Ref Range   Sodium 135 135 - 145  mmol/L   Potassium 3.6 3.5 - 5.1 mmol/L   Chloride 107 98 - 111 mmol/L   CO2 20 (L) 22 - 32 mmol/L   Glucose, Bld 71 70 - 99 mg/dL   BUN 6 6 - 20 mg/dL   Creatinine, Ser 0.44 0.44 - 1.00 mg/dL   Calcium 8.6 (L) 8.9 - 10.3 mg/dL   Total Protein 6.6 6.5 - 8.1 g/dL   Albumin 3.2 (L) 3.5 - 5.0 g/dL   AST 16 15 - 41 U/L   ALT 8 0 - 44 U/L   Alkaline Phosphatase 61 38 - 126 U/L   Total Bilirubin 0.1 (L) 0.3 - 1.2 mg/dL   GFR calc non Af Amer >60 >60 mL/min   GFR calc Af Amer >60 >60 mL/min   Anion gap 8 5 - 15  TSH     Status: None   Collection Time: 03/25/18 10:46 AM  Result Value Ref Range   TSH 1.326 0.350 - 4.500 uIU/mL  Magnesium     Status: None   Collection Time: 03/25/18 10:46 AM  Result Value Ref Range   Magnesium 2.0 1.7 - 2.4 mg/dL  Urinalysis, Routine w reflex microscopic     Status: Abnormal   Collection Time: 03/25/18 12:37 PM  Result Value Ref Range   Color, Urine STRAW (A) YELLOW   APPearance CLEAR CLEAR   Specific Gravity, Urine 1.006 1.005 - 1.030   pH 7.0 5.0 - 8.0   Glucose, UA NEGATIVE NEGATIVE mg/dL   Hgb urine dipstick NEGATIVE NEGATIVE   Bilirubin Urine NEGATIVE NEGATIVE   Ketones, ur 20 (A) NEGATIVE mg/dL   Protein, ur NEGATIVE NEGATIVE mg/dL   Nitrite NEGATIVE NEGATIVE   Leukocytes, UA NEGATIVE NEGATIVE    Imaging:  No results found.  MAU Course: Orders Placed This Encounter  Procedures  . Urinalysis, Routine w reflex microscopic  . CBC  . Comprehensive metabolic panel  .  TSH  . Magnesium   Meds ordered this encounter  Medications  . FOLLOWED BY Linked Order Group   . lactated ringers bolus 1,000 mL   . multivitamins adult (INFUVITE ADULT) 10 mL in dextrose 5% lactated ringers 1,000 mL infusion  . famotidine (PEPCID) IVPB 20 mg premix  . promethazine (PHENERGAN) 25 mg in sodium chloride 0.9 % 1,000 mL infusion  . ondansetron (ZOFRAN) 8 mg in sodium chloride 0.9 % 50 mL IVPB  . DISCONTD: scopolamine (TRANSDERM-SCOP) 1 MG/3DAYS 1.5 mg  . scopolamine (TRANSDERM-SCOP) 1 MG/3DAYS 1.5 mg    MDM: Documented 8# weight loss in the last 2 days (per clinic scale): 217.5 lb (1/6), 213.2 lb (1/7), 209.4 lb (1/8) IV started, 1 liter of LR & phenergan 25 mg followed by MVI in 1 liter of D5LR & zofran 8 mg, pt also given pepcid 20 mg.  CBC, CMP, Mag, & TSH labs ordered After receiving IVF & meds, pt vomited twice after taking a sip of water.  Scop patch applied C/w Dr. Rip Harbour. Will admit to [facility] for HEG control  Assessment: 1. Hyperemesis gravidarum, antepartum   2. [redacted] weeks gestation of pregnancy     Plan: Place in obs on [facility] unit IV fluids Scheduled phenergan, zofran, & pepcid Advance diet as tolerated NST Q shift  Jorje Guild, NP 03/25/2018 5:31 PM

## 2018-03-25 NOTE — MAU Note (Signed)
Pt states she has been vomiting throughout the pregnancy, however, the last 2 days have been worse.  Pt has tried taken prescribed meds (zofran odt & phenergan), nothing helps.  Pt states she "has been feeling the cramping for a while, but lately it's been worse".  States she has lost 9lbs over last 2 days.  Denies diarrhea or fever.  Denies lof, vag bleeding.  Endorses +FM.

## 2018-03-25 NOTE — Progress Notes (Signed)
I have reviewed this chart and agree with the RN/CMA assessment and management.    Edrie Ehrich C Jann Milkovich, MD, FACOG Attending Physician, [facility]  

## 2018-03-25 NOTE — Progress Notes (Signed)
Patient presents to office today for weight check following visit yesterday with Dr Hulan Fray. Patient reports picking up prescription yesterday and taking medication but it hasn't seemed to make a difference yet. Will route to Dr Hulan Fray.  Koren Bound RN BSN 03/25/18

## 2018-03-26 DIAGNOSIS — O210 Mild hyperemesis gravidarum: Secondary | ICD-10-CM

## 2018-03-26 MED ORDER — ONDANSETRON 4 MG PO TBDP: 4.0000 mg | ORAL_TABLET | Freq: Four times a day (QID) | ORAL | 0 refills | Status: DC | PRN

## 2018-03-26 MED ORDER — PROMETHAZINE HCL 25 MG RE SUPP: 25.0000 mg | Freq: Four times a day (QID) | RECTAL | 0 refills | Status: DC | PRN

## 2018-03-26 MED ORDER — DIPHENHYDRAMINE HCL 25 MG PO CAPS
25.0000 mg | ORAL_CAPSULE | Freq: Four times a day (QID) | ORAL | Status: DC | PRN
  Administered 2018-03-26: 25 mg via ORAL
  Filled 2018-03-26: qty 1

## 2018-03-26 MED ORDER — SCOPOLAMINE 1 MG/3DAYS TD PT72: 1.0000 | MEDICATED_PATCH | Freq: Once | TRANSDERMAL | 12 refills | Status: AC

## 2018-03-26 NOTE — Progress Notes (Signed)
Patient discharged home. Discharge teaching, home care, follow-up appt, prescriptions, and reasons to seek care discussed. Pt verbalized understanding.

## 2018-03-26 NOTE — Progress Notes (Signed)
Patient ID: Emma Chambers, female   DOB: [birth year], 28 y.o.   MRN: [id number] [place]) NOTE  Emma Chambers is a 28 y.o. [id number] at [redacted]w[redacted]d by best clinical estimate who is admitted for hyperemesis.   Fetal presentation is cephalic. Length of Stay:  0  Days  Subjective: Doing well. No emesis since last pm in MAU. Feels hungry and wants to eat. Patient reports the fetal movement as active. Patient reports uterine contraction  activity as none. Patient reports  vaginal bleeding as none. Patient describes fluid per vagina as None.  Vitals:  Blood pressure (!) 91/54, pulse 87, temperature 98.2 F (36.8 C), temperature source Oral, resp. rate 20, height 5\' 1"  (1.549 m), weight 96.5 kg, last menstrual period 09/15/2017, SpO2 100 %. Physical Examination:  General appearance - alert, well appearing, and in no distress Chest - normal effort Abdomen - gravid, non-tender Fundal Height:  size equals dates Extremities: Homans sign is negative, no sign of DVT  Membranes:intact  Fetal Monitoring:  Baseline: 140 bpm, Variability: Good {> 6 bpm), Accelerations: Non-reactive but appropriate for gestational age and Decelerations: Absent  Labs:  Results for orders placed or performed during the hospital encounter of 03/25/18 (from the past 24 hour(s))  Urinalysis, Routine w reflex microscopic   Collection Time: 03/25/18 12:37 PM  Result Value Ref Range   Color, Urine STRAW (A) YELLOW   APPearance CLEAR CLEAR   Specific Gravity, Urine 1.006 1.005 - 1.030   pH 7.0 5.0 - 8.0   Glucose, UA NEGATIVE NEGATIVE mg/dL   Hgb urine dipstick NEGATIVE NEGATIVE   Bilirubin Urine NEGATIVE NEGATIVE   Ketones, ur 20 (A) NEGATIVE mg/dL   Protein, ur NEGATIVE NEGATIVE mg/dL   Nitrite NEGATIVE NEGATIVE   Leukocytes, UA NEGATIVE NEGATIVE    Medications:  Scheduled . docusate sodium  100 mg Oral Daily  . ondansetron (ZOFRAN) IV  4 mg Intravenous Q6H  . prenatal multivitamin   1 tablet Oral Q1200  . promethazine  25 mg Intravenous Q6H  . scopolamine  1 patch Transdermal Once   I have reviewed the patient's current medications.  ASSESSMENT: Active Problems:   Hyperemesis gravidarum, antepartum  PLAN: Advance diet Continue scopolamine patch, Pepcid, Zofran, phenergan If tolerates po, dc IVF Consider d/c when tolerating po  [name], MD 03/26/2018,11:29 AM

## 2018-03-26 NOTE — Discharge Instructions (Signed)
Hyperemesis Gravidarum Hyperemesis gravidarum is a severe form of nausea and vomiting that happens during pregnancy. Hyperemesis is worse than morning sickness. It may cause you to have nausea or vomiting all day for many days. It may keep you from eating and drinking enough food and liquids, which can lead to dehydration, malnutrition, and weight loss. Hyperemesis usually occurs during the first half (the first 20 weeks) of pregnancy. It often goes away once a woman is in her second half of pregnancy. However, sometimes hyperemesis continues through an entire pregnancy. What are the causes? The cause of this condition is not known. It may be related to changes in chemicals (hormones) in the body during pregnancy, such as the high level of pregnancy hormone (human chorionic gonadotropin) or the increase in the female sex hormone (estrogen). What are the signs or symptoms? Symptoms of this condition include:  Nausea that does not go away.  Vomiting that does not allow you to keep any food down.  Weight loss.  Body fluid loss (dehydration).  Having no desire to eat, or not liking food that you have previously enjoyed. How is this diagnosed? This condition may be diagnosed based on:  A physical exam.  Your medical history.  Your symptoms.  Blood tests.  Urine tests. How is this treated? This condition is managed by controlling symptoms. This may include:  Following an eating plan. This can help lessen nausea and vomiting.  Taking prescription medicines. An eating plan and medicines are often used together to help control symptoms. If medicines do not help relieve nausea and vomiting, you may need to receive fluids through an IV at the hospital. Follow these instructions at home: Eating and drinking   Avoid the following: ? Drinking fluids with meals. Try not to drink anything during the 30 minutes before and after your meals. ? Drinking more than 1 cup of fluid at a  time. ? Eating foods that trigger your symptoms. These may include spicy foods, coffee, high-fat foods, very sweet foods, and acidic foods. ? Skipping meals. Nausea can be more intense on an empty stomach. If you cannot tolerate food, do not force it. Try sucking on ice chips or other frozen items and make up for missed calories later. ? Lying down within 2 hours after eating. ? Being exposed to environmental triggers. These may include food smells, smoky rooms, closed spaces, rooms with strong smells, warm or humid places, overly loud and noisy rooms, and rooms with motion or flickering lights. Try eating meals in a well-ventilated area that is free of strong smells. ? Quick and sudden changes in your movement. ? Taking iron pills and multivitamins that contain iron. If you take prescription iron pills, do not stop taking them unless your health care provider approves. ? Preparing food. The smell of food can spoil your appetite or trigger nausea.  To help relieve your symptoms: ? Listen to your body. Everyone is different and has different preferences. Find what works best for you. ? Eat and drink slowly. ? Eat 5-6 small meals daily instead of 3 large meals. Eating small meals and snacks can help you avoid an empty stomach. ? In the morning, before getting out of bed, eat a couple of crackers to avoid moving around on an empty stomach. ? Try eating starchy foods as these are usually tolerated well. Examples include cereal, toast, bread, potatoes, pasta, rice, and pretzels. ? Include at least 1 serving of protein with your meals and snacks. Protein options include  least 1 serving of protein with your meals and snacks. Protein options include lean meats, poultry, seafood, beans, nuts, nut butters, eggs, cheese, and yogurt.  Try eating a protein-rich snack before bed. Examples of a protein-rick snack include cheese and crackers or a peanut butter sandwich made with 1 slice of whole-wheat bread and 1 tsp (5 g) of peanut butter.  Eat or suck on things that have ginger in them. It may help relieve nausea. Add  tsp ground ginger to hot tea or  choose ginger tea.  Try drinking 100% fruit juice or an electrolyte drink. An electrolyte drink contains sodium, potassium, and chloride.  Drink fluids that are cold, clear, and carbonated or sour. Examples include lemonade, ginger ale, lemon-lime soda, ice water, and sparkling water.  Brush your teeth or use a mouth rinse after meals.  Talk with your health care provider about starting a supplement of vitamin B6.  General instructions  Take over-the-counter and prescription medicines only as told by your health care provider.  Follow instructions from your health care provider about eating or drinking restrictions.  Continue to take your prenatal vitamins as told by your health care provider. If you are having trouble taking your prenatal vitamins, talk with your health care provider about different options.  Keep all follow-up and pre-birth (prenatal) visits as told by your health care provider. This is important.  Contact a health care provider if:  You have pain in your abdomen.  You have a severe headache.  You have vision problems.  You are losing weight.  You feel weak or dizzy.  Get help right away if:  You cannot drink fluids without vomiting.  You vomit blood.  You have constant nausea and vomiting.  You are very weak.  You faint.  You have a fever and your symptoms suddenly get worse.  Summary  Hyperemesis gravidarum is a severe form of nausea and vomiting that happens during pregnancy.  Making some changes to your eating habits may help relieve nausea and vomiting.  This condition may be managed with medicine.  If medicines do not help relieve nausea and vomiting, you may need to receive fluids through an IV at the hospital.  This information is not intended to replace advice given to you by your health care provider. Make sure you discuss any questions you have with your health care provider.  Document Released: 03/04/2005 Document Revised: 03/24/2017 Document Reviewed: 11/01/2015  Elsevier Interactive  Patient Education  2019 [employer]

## 2018-03-26 NOTE — Discharge Summary (Signed)
Antenatal Physician Discharge Summary  Patient ID: Emma Chambers MRN: [id number] DOB/AGE: November 06, 1990 28 y.o.  Admit date: 03/25/2018 Discharge date: 03/26/2018  Admission Diagnoses: Active Problems:   Hyperemesis gravidarum, antepartum   Dehydration   Weight loss    Discharge Diagnoses: Same  Prenatal Procedures: none  Consults: None  Hospital Course:  This is a 28 y.o. [id number] with IUP at [redacted]w[redacted]d admitted for Hyperemesis and 8 lb weight loss in 2 days. She was admitted and given antiemetics and IV fluid hydration. She stopped vomiting and was tolerating po and was deemed stable for discharge to home with outpatient follow up.  Discharge Exam: Temp:  [98.1 F (36.7 C)-98.9 F (37.2 C)] 98.7 F (37.1 C) (01/09 1226) Pulse Rate:  [79-98] 97 (01/09 1226) Resp:  [18-20] 20 (01/09 1226) BP: (82-114)/(44-76) 114/65 (01/09 1226) SpO2:  [100 %] 100 % (01/09 1226) Weight:  [98 kg] 98 kg (01/09 1226) Physical Examination: CONSTITUTIONAL: Well-developed, well-nourished female in no acute distress.   CARDIOVASCULAR: Normal heart rate noted, regular rhythm RESPIRATORY: Effort and breath sounds normal, no problems with respiration noted   Significant Diagnostic Studies:  Results for orders placed or performed during the hospital encounter of 03/25/18 (from the past 168 hour(s))  CBC   Collection Time: 03/25/18 10:46 AM  Result Value Ref Range   WBC 9.3 4.0 - 10.5 K/uL   RBC 3.73 (L) 3.87 - 5.11 MIL/uL   Hemoglobin 10.5 (L) 12.0 - 15.0 g/dL   HCT 32.3 (L) 36.0 - 46.0 %   MCV 86.6 80.0 - 100.0 fL   MCH 28.2 26.0 - 34.0 pg   MCHC 32.5 30.0 - 36.0 g/dL   RDW 13.8 11.5 - 15.5 %   Platelets 283 150 - 400 K/uL   nRBC 0.0 0.0 - 0.2 %  Comprehensive metabolic panel   Collection Time: 03/25/18 10:46 AM  Result Value Ref Range   Sodium 135 135 - 145 mmol/L   Potassium 3.6 3.5 - 5.1 mmol/L   Chloride 107 98 - 111 mmol/L   CO2 20 (L) 22 - 32 mmol/L   Glucose, Bld 71 70 - 99 mg/dL    BUN 6 6 - 20 mg/dL   Creatinine, Ser 0.44 0.44 - 1.00 mg/dL   Calcium 8.6 (L) 8.9 - 10.3 mg/dL   Total Protein 6.6 6.5 - 8.1 g/dL   Albumin 3.2 (L) 3.5 - 5.0 g/dL   AST 16 15 - 41 U/L   ALT 8 0 - 44 U/L   Alkaline Phosphatase 61 38 - 126 U/L   Total Bilirubin 0.1 (L) 0.3 - 1.2 mg/dL   GFR calc non Af Amer >60 >60 mL/min   GFR calc Af Amer >60 >60 mL/min   Anion gap 8 5 - 15  TSH   Collection Time: 03/25/18 10:46 AM  Result Value Ref Range   TSH 1.326 0.350 - 4.500 uIU/mL  Magnesium   Collection Time: 03/25/18 10:46 AM  Result Value Ref Range   Magnesium 2.0 1.7 - 2.4 mg/dL  Urinalysis, Routine w reflex microscopic   Collection Time: 03/25/18 12:37 PM  Result Value Ref Range   Color, Urine STRAW (A) YELLOW   APPearance CLEAR CLEAR   Specific Gravity, Urine 1.006 1.005 - 1.030   pH 7.0 5.0 - 8.0   Glucose, UA NEGATIVE NEGATIVE mg/dL   Hgb urine dipstick NEGATIVE NEGATIVE   Bilirubin Urine NEGATIVE NEGATIVE   Ketones, ur 20 (A) NEGATIVE mg/dL   Protein, ur NEGATIVE NEGATIVE mg/dL  Nitrite NEGATIVE NEGATIVE   Leukocytes, UA NEGATIVE NEGATIVE  Results for orders placed or performed in visit on 03/25/18 (from the past 168 hour(s))  Hemoglobin A1c   Collection Time: 03/25/18 10:35 AM  Result Value Ref Range   Hgb A1c MFr Bld 5.0 4.8 - 5.6 %   Est. average glucose Bld gHb Est-mCnc 97 mg/dL  Results for orders placed or performed in visit on 03/24/18 (from the past 168 hour(s))  POCT urinalysis dip (device)   Collection Time: 03/24/18 10:27 AM  Result Value Ref Range   Glucose, UA NEGATIVE NEGATIVE mg/dL   Bilirubin Urine NEGATIVE NEGATIVE   Ketones, ur NEGATIVE NEGATIVE mg/dL   Specific Gravity, Urine 1.025 1.005 - 1.030   Hgb urine dipstick NEGATIVE NEGATIVE   pH 7.0 5.0 - 8.0   Protein, ur 30 (A) NEGATIVE mg/dL   Urobilinogen, UA 0.2 0.0 - 1.0 mg/dL   Nitrite NEGATIVE NEGATIVE   Leukocytes, UA NEGATIVE NEGATIVE   [place] [facility] Ob Follow Up  Result Date:  03/23/2018 ----------------------------------------------------------------------  OBSTETRICS REPORT                       (Signed Final 03/23/2018 12:00 pm) ---------------------------------------------------------------------- Patient Info  ID #:       [id number]                          D.O.B.:  1991-02-19 (27 yrs)  Name:       Emma Chambers               Visit Date: 03/23/2018 11:18 am ---------------------------------------------------------------------- Performed By  Performed By:     Jeanene Erb BS,      Ref. Address:      [address]                    [place],                                                              [place] [address]  Attending:        Sander Nephew      Location:          [facility]                    MD  Referred By:      Tresea Mall CNM ---------------------------------------------------------------------- Orders   #  Description                          Code         Ordered By   1  [place] MFM OB FOLLOW UP  26378.58     Row[place]  ----------------------------------------------------------------------   #  Order #                    Accession #                 Episode #   1  [id number]                  [id number]                  [id number]  ---------------------------------------------------------------------- Indications   Obesity complicating pregnancy, second         O99.212   trimester (Pre Pregnancy BMI 36.4)   Poor obstetric history: Previous               O09.299   preeclampsia / eclampsia/gestational HTN   Poor obstetric history (prior pre-term labor)- O09.219   36w 4d   [redacted] weeks gestation of pregnancy                Z3A.27   Encounter for other antenatal screening        Z36.2   follow-up  ---------------------------------------------------------------------- Vital Signs  Weight (lb): 213                               Height:        5'2"  BMI:         38.95  ---------------------------------------------------------------------- Fetal Evaluation  Num Of Fetuses:          1  Fetal Heart Rate(bpm):   158  Cardiac Activity:        Observed  Presentation:            Cephalic  Placenta:                Anterior  P. Cord Insertion:       Previously Visualized  Amniotic Fluid  AFI FV:      Within normal limits                              Largest Pocket(cm)                              4.8 ---------------------------------------------------------------------- Biometry  BPD:      69.4  mm     G. Age:  27w 6d         69  %    CI:        76.39   %    70 - 86                                                          FL/HC:       19.8  %    18.6 - 20.4  HC:      251.6  mm     G. Age:  27w 3d         33  %    HC/AC:       1.10       1.05 - 1.21  AC:      228.3  mm  G. Age:  27w 2d         48  %    FL/BPD:      71.8  %    71 - 87  FL:       49.8  mm     G. Age:  26w 6d         30  %    FL/AC:       21.8  %    20 - 24  Est. FW:    1030   gm     2 lb 4 oz     56  % ---------------------------------------------------------------------- OB History  Gravidity:    4         Term:   1        Prem:   1  Ectopic:      1        Living:  2 ---------------------------------------------------------------------- Gestational Age  LMP:           27w 0d        Date:  09/15/17                 EDD:   06/22/18  U/S Today:     27w 3d                                        EDD:   06/19/18  Best:          27w 0d     Det. By:  LMP  (09/15/17)          EDD:   06/22/18 ---------------------------------------------------------------------- Anatomy  Cranium:               Appears normal         LVOT:                   Previously seen  Cavum:                 Previously seen        Aortic Arch:            Previously seen  Ventricles:            Appears normal         Ductal Arch:            Previously seen  Choroid Plexus:        Previously seen        Diaphragm:              Appears normal  Cerebellum:             Previously seen        Stomach:                Appears normal, left                                                                        sided  Posterior Fossa:       Previously seen        Abdomen:  Appears normal  Nuchal Fold:           Previously seen        Abdominal Wall:         Previously seen  Face:                  Orbits and profile     Cord Vessels:           Previously seen                         previously seen  Lips:                  Appears normal         Kidneys:                Appear normal  Palate:                Not well visualized    Bladder:                Appears normal  Thoracic:              Appears normal         Spine:                  Previously seen  Heart:                 Appears normal         Upper Extremities:      Previously seen                         (4CH, axis, and situs  RVOT:                  Previously seen        Lower Extremities:      Previously seen  Other:  Female gender Heels, 5th digit, Nasal bone, and Open hands          previously visualized. ---------------------------------------------------------------------- Cervix Uterus Adnexa  Cervix  Not visualized (advanced GA >24wks) ---------------------------------------------------------------------- Impression  Normal interval growth. ---------------------------------------------------------------------- Recommendations  Follow up growth in 4 weeks. ----------------------------------------------------------------------               Sander Nephew, MD Electronically Signed Final Report   03/23/2018 12:00 pm ----------------------------------------------------------------------   Future Appointments  Date Time Provider [place]  04/06/2018  1:55 PM Jorje Guild, NP [facility] [place]  04/20/2018  9:30 AM [place] 1 WH-MFCUS M[facility]-US    Discharge Condition: Stable  There are no questions and answers to display.         Allergies as of 03/26/2018      Reactions   Amoxicillin Nausea  And Vomiting, Other (See Comments)   Tics & twitches Has patient had a PCN reaction causing immediate rash, facial/tongue/throat swelling, SOB or lightheadedness with hypotension: Yes Has patient had a PCN reaction causing severe rash involving mucus membranes or skin necrosis: No Has patient had a PCN reaction that required hospitalization No Has patient had a PCN reaction occurring within the last 10 years: Yes If all of the above answers are "NO", then may proceed with Cephalosporin use.   Contrast Media [iodinated Diagnostic Agents] Hives   Dilaudid [hydromorphone Hcl] Itching      Medication List    TAKE these medications   acetaminophen 325 MG tablet Commonly known as:  TYLENOL Take 650 mg by mouth every 6 (six) hours as needed for headache.   aspirin EC 81 MG tablet Take 1 tablet (81 mg total) by mouth daily.   COMFORT FIT MATERNITY SUPP LG Misc 1 Units by Does not apply route daily.   metoCLOPramide 10 MG tablet Commonly known as:  REGLAN Take 1 tablet (10 mg total) by mouth 3 (three) times daily before meals.   ondansetron 4 MG disintegrating tablet Commonly known as:  ZOFRAN ODT Take 1 tablet (4 mg total) by mouth every 6 (six) hours as needed for nausea.   pantoprazole 20 MG tablet Commonly known as:  PROTONIX Take 1 tablet (20 mg total) by mouth 2 (two) times daily.   prenatal vitamin w/FE, FA 27-1 MG Tabs tablet Take 1 tablet by mouth daily at 12 noon.   promethazine 25 MG suppository Commonly known as:  PHENERGAN Place 1 suppository (25 mg total) rectally every 6 (six) hours as needed for nausea or vomiting.   scopolamine 1 MG/3DAYS Commonly known as:  TRANSDERM-SCOP Place 1 patch (1.5 mg total) onto the skin once for 1 dose.      Follow-up [facility] [facility] Follow up in 1 week(s).   Specialty:  Obstetrics and Gynecology Why:  Keep next appointment Contact information: [address]  [place] [phone]          Signed: [name] M.D. 03/26/2018, 4:45 PM

## 2018-04-02 ENCOUNTER — Encounter: Payer: Self-pay | Admitting: *Deleted

## 2018-04-02 LAB — CBC
Hematocrit: 32.2 % — ABNORMAL LOW (ref 34.0–46.6)
Hemoglobin: 10.6 g/dL — ABNORMAL LOW (ref 11.1–15.9)
MCH: 28.4 pg (ref 26.6–33.0)
MCHC: 32.9 g/dL (ref 31.5–35.7)
MCV: 86 fL (ref 79–97)
Platelets: 276 10*3/uL (ref 150–450)
RBC: 3.73 x10E6/uL — ABNORMAL LOW (ref 3.77–5.28)
RDW: 13.7 % (ref 11.7–15.4)
WBC: 9.4 10*3/uL (ref 3.4–10.8)

## 2018-04-02 LAB — HIV ANTIBODY (ROUTINE TESTING W REFLEX): HIV Screen 4th Generation wRfx: NONREACTIVE

## 2018-04-02 LAB — RPR: RPR Ser Ql: NONREACTIVE

## 2018-04-03 LAB — SPECIMEN STATUS REPORT

## 2018-04-05 ENCOUNTER — Encounter (HOSPITAL_COMMUNITY): Payer: Self-pay | Admitting: *Deleted

## 2018-04-05 ENCOUNTER — Inpatient Hospital Stay (HOSPITAL_COMMUNITY)
Admission: AD | Admit: 2018-04-05 | Discharge: 2018-04-05 | Disposition: A | Discharge status: Home / Self Care | Payer: Medicaid Other | Source: Ambulatory Visit | Attending: Obstetrics & Gynecology | Admitting: Obstetrics & Gynecology

## 2018-04-05 DIAGNOSIS — Z7982 Long term (current) use of aspirin: Secondary | ICD-10-CM | POA: Diagnosis not present

## 2018-04-05 DIAGNOSIS — O99213 Obesity complicating pregnancy, third trimester: Secondary | ICD-10-CM | POA: Insufficient documentation

## 2018-04-05 DIAGNOSIS — Z3689 Encounter for other specified antenatal screening: Secondary | ICD-10-CM

## 2018-04-05 DIAGNOSIS — Z3480 Encounter for supervision of other normal pregnancy, unspecified trimester: Secondary | ICD-10-CM

## 2018-04-05 DIAGNOSIS — Z880 Allergy status to penicillin: Secondary | ICD-10-CM | POA: Insufficient documentation

## 2018-04-05 DIAGNOSIS — Z3A28 28 weeks gestation of pregnancy: Secondary | ICD-10-CM | POA: Diagnosis not present

## 2018-04-05 DIAGNOSIS — Z0371 Encounter for suspected problem with amniotic cavity and membrane ruled out: Secondary | ICD-10-CM

## 2018-04-05 DIAGNOSIS — O4703 False labor before 37 completed weeks of gestation, third trimester: Secondary | ICD-10-CM

## 2018-04-05 DIAGNOSIS — O368130 Decreased fetal movements, third trimester, not applicable or unspecified: Secondary | ICD-10-CM | POA: Diagnosis present

## 2018-04-05 DIAGNOSIS — O99210 Obesity complicating pregnancy, unspecified trimester: Secondary | ICD-10-CM

## 2018-04-05 DIAGNOSIS — Z87891 Personal history of nicotine dependence: Secondary | ICD-10-CM | POA: Diagnosis not present

## 2018-04-05 DIAGNOSIS — E669 Obesity, unspecified: Secondary | ICD-10-CM | POA: Diagnosis not present

## 2018-04-05 DIAGNOSIS — O219 Vomiting of pregnancy, unspecified: Secondary | ICD-10-CM

## 2018-04-05 LAB — URINALYSIS, ROUTINE W REFLEX MICROSCOPIC
Bilirubin Urine: NEGATIVE
Glucose, UA: NEGATIVE mg/dL
Hgb urine dipstick: NEGATIVE
Ketones, ur: NEGATIVE mg/dL
Leukocytes, UA: NEGATIVE
Nitrite: NEGATIVE
Protein, ur: NEGATIVE mg/dL
Specific Gravity, Urine: 1.027 (ref 1.005–1.030)
pH: 7 (ref 5.0–8.0)

## 2018-04-05 LAB — WET PREP, GENITAL
Clue Cells Wet Prep HPF POC: NONE SEEN
Sperm: NONE SEEN
Trich, Wet Prep: NONE SEEN
Yeast Wet Prep HPF POC: NONE SEEN

## 2018-04-05 LAB — AMNISURE RUPTURE OF MEMBRANE (ROM) NOT AT ARMC: Amnisure ROM: NEGATIVE

## 2018-04-05 MED ORDER — TERBUTALINE SULFATE 1 MG/ML IJ SOLN
0.2500 mg | Freq: Once | INTRAMUSCULAR | Status: AC
  Administered 2018-04-05: 0.25 mg via SUBCUTANEOUS
  Filled 2018-04-05: qty 1

## 2018-04-05 MED ORDER — METOCLOPRAMIDE HCL 5 MG/ML IJ SOLN
10.0000 mg | Freq: Once | INTRAMUSCULAR | Status: AC
  Administered 2018-04-05: 10 mg via INTRAVENOUS
  Filled 2018-04-05: qty 2

## 2018-04-05 MED ORDER — FAMOTIDINE IN NACL 20-0.9 MG/50ML-% IV SOLN
20.0000 mg | Freq: Once | INTRAVENOUS | Status: AC
  Administered 2018-04-05: 20 mg via INTRAVENOUS
  Filled 2018-04-05: qty 50

## 2018-04-05 MED ORDER — ONDANSETRON HCL 4 MG/2ML IJ SOLN
4.0000 mg | Freq: Once | INTRAMUSCULAR | Status: AC
  Administered 2018-04-05: 4 mg via INTRAVENOUS
  Filled 2018-04-05: qty 2

## 2018-04-05 MED ORDER — LACTATED RINGERS IV BOLUS
1000.0000 mL | Freq: Once | INTRAVENOUS | Status: AC
  Administered 2018-04-05: 1000 mL via INTRAVENOUS

## 2018-04-05 NOTE — MAU Note (Signed)
Still having lots of nausea and vomiting since she was discharge. Reports she has been taking medications Rx  Reports some leaking of clear fluid around 12  Feeling weak  Having lower abdominal pain  Has not felt any fetal movement today

## 2018-04-05 NOTE — MAU Provider Note (Signed)
Chief Complaint:  Abdominal Pain; Rupture of Membranes; Nausea; and Decreased Fetal Movement   None     HPI: Emma Chambers is a 28 y.o. [phone] at [redacted]w[redacted]d by LMP, c/w 7 week [place], who presents to maternity admissions reporting leakage of fluid, cramping, decreased fetal movement, and nausea and vomiting. She reports that she was standing watching television when she felt a gush that dripped down to her leg this morning.  The fluid was clear without odor.  She denies any further leakage and has not required a pad.  Afterwards, she reprots intermittent cramping pain in her low abdomen that radiates to her back when she is lying down.  There has been less fetal movement today than usual, last movement felt since arriving in MAU.  She has been managed in this pregnancy for n/v and had hospital admission on 03/25/18-03/26/18.  She is taking PO medications at home and last took Zofran 4 mg ODT today at 10:00 am.  The medications are not helping.  She does report using marijuana some because someone told her it would help but it does not help either.  Nothing makes this nausea better or worse.  She vomits 4-5 times daily, including in the shower, and in the car on the way here today.  There is heartburn associated with the n/v.  HPI  Past Medical History: Past Medical History:  Diagnosis Date  . Anemia   . Gallstones   . H. pylori infection   . Headache(784.0)    chronic  . Kidney stones   . Ovarian cyst   . Pregnancy induced hypertension   . UTI (lower urinary tract infection)     Past obstetric history: OB History  Gravida Para Term Preterm AB Living  4 2 1 1 1 2   SAB TAB Ectopic Multiple Live Births  0 0 1 0 2    # Outcome Date GA Lbr Len/2nd Weight Sex Delivery Anes PTL Lv  4 Current           3 Ectopic 2016          2 Preterm 06/29/12 [redacted]w[redacted]d 10:57 / 00:16 2568 g M Vag-Spont EPI  LIV  1 Term 05/20/11 [redacted]w[redacted]d 22:02 / 00:26 3675 g F Vag-Spont EPI  LIV     Birth Comments: none   Complications: Gestational hypertension    Obstetric Comments  2014: IOL at 92 weeks 2/2 gallstones  2013: GHTN  2016: ectopic pregnancy: Had methotrexate and then had surgery     Past Surgical History: Past Surgical History:  Procedure Laterality Date  . CHOLECYSTECTOMY N/A 07/27/2012   Procedure: LAPAROSCOPIC CHOLECYSTECTOMY WITH INTRAOPERATIVE CHOLANGIOGRAM;  Surgeon: Madilyn Hook, DO;  Location: [facility];  Service: General;  Laterality: N/A;  . DIAGNOSTIC LAPAROSCOPY WITH REMOVAL OF ECTOPIC PREGNANCY N/A 12/27/2014   Procedure: DIAGNOSTIC LAPAROSCOPY, Right Salpingectomy with removal ECTOPIC PREGNANCY;  Surgeon: [name], MD;  Location: [facility];  Service: Gynecology;  Laterality: N/A;  . LAPAROSCOPIC APPENDECTOMY N/A 12/09/2012   Procedure: APPENDECTOMY LAPAROSCOPIC;  Surgeon: Gwenyth Ober, MD;  Location: [facility];  Service: General;  Laterality: N/A;    Family History: Family History  Problem Relation Age of Onset  . Asthma Mother   . Hypertension Mother   . Parkinsonism Mother   . Asthma Brother   . Diabetes Maternal Grandmother   . Hypertension Maternal Grandmother   . Stroke Maternal Grandmother   . Heart disease Maternal Grandmother   . Lupus Maternal Aunt   .  Heart disease Maternal Grandfather   . Anesthesia problems Neg Hx   . Malignant hyperthermia Neg Hx   . Pseudochol deficiency Neg Hx   . Hypotension Neg Hx     Social History: Social History   Tobacco Use  . Smoking status: Former Smoker    Types: Cigarettes  . Smokeless tobacco: Never Used  . Tobacco comment: 2014  Substance Use Topics  . Alcohol use: Not Currently    Comment: social  . Drug use: No    Allergies:  Allergies  Allergen Reactions  . Amoxicillin Nausea And Vomiting and Other (See Comments)    Tics & twitches Has patient had a PCN reaction causing immediate rash, facial/tongue/throat swelling, SOB or lightheadedness with hypotension: Yes Has patient had a PCN reaction causing severe  rash involving mucus membranes or skin necrosis: No Has patient had a PCN reaction that required hospitalization No Has patient had a PCN reaction occurring within the last 10 years: Yes If all of the above answers are "NO", then may proceed with Cephalosporin use.  . Contrast Media [Iodinated Diagnostic Agents] Hives  . Dilaudid [Hydromorphone Hcl] Itching    Meds:  Medications Prior to Admission  Medication Sig Dispense Refill Last Dose  . acetaminophen (TYLENOL) 325 MG tablet Take 650 mg by mouth every 6 (six) hours as needed for headache.   03/25/2018 at Unknown time  . aspirin EC 81 MG tablet Take 1 tablet (81 mg total) by mouth daily. 30 tablet 11 03/24/2018 at Unknown time  . Elastic Bandages & Supports (COMFORT FIT MATERNITY SUPP LG) MISC 1 Units by Does not apply route daily. (Patient not taking: Reported on 03/24/2018) 1 each 0 Not Taking  . metoCLOPramide (REGLAN) 10 MG tablet Take 1 tablet (10 mg total) by mouth 3 (three) times daily before meals. 90 tablet 2 03/24/2018 at Unknown time  . ondansetron (ZOFRAN ODT) 4 MG disintegrating tablet Take 1 tablet (4 mg total) by mouth every 6 (six) hours as needed for nausea. 20 tablet 0   . pantoprazole (PROTONIX) 20 MG tablet Take 1 tablet (20 mg total) by mouth 2 (two) times daily. 60 tablet 6 03/25/2018 at Unknown time  . prenatal vitamin w/FE, FA (PRENATAL 1 + 1) 27-1 MG TABS tablet Take 1 tablet by mouth daily at 12 noon. 30 each 11 03/25/2018 at Unknown time  . promethazine (PHENERGAN) 25 MG suppository Place 1 suppository (25 mg total) rectally every 6 (six) hours as needed for nausea or vomiting. 12 each 0     ROS:  Review of Systems  Constitutional: Negative for chills, fatigue and fever.  Eyes: Negative for visual disturbance.  Respiratory: Negative for shortness of breath.   Cardiovascular: Negative for chest pain.  Gastrointestinal: Positive for abdominal pain. Negative for nausea and vomiting.  Genitourinary: Positive for pelvic pain  and vaginal discharge. Negative for difficulty urinating, dysuria, flank pain, vaginal bleeding and vaginal pain.  Musculoskeletal: Positive for back pain.  Neurological: Negative for dizziness and headaches.  Psychiatric/Behavioral: Negative.      I have reviewed patient's Past Medical Hx, Surgical Hx, Family Hx, Social Hx, medications and allergies.   Physical Exam   Patient Vitals for the past 24 hrs:  BP Temp Temp src Pulse Resp Height Weight  04/05/18 1228 123/69 98.5 F (36.9 C) Oral 96 18 5\' 1"  (1.549 m) 95.5 kg   Constitutional: Well-developed, well-nourished female in no acute distress.  Cardiovascular: normal rate Respiratory: normal effort GI: Abd soft, non-tender, gravid appropriate  for gestational age.  MS: Extremities nontender, no edema, normal ROM Neurologic: Alert and oriented x 4.  GU: Neg CVAT.  PELVIC EXAM: Cervix pink, visually closed, without lesion, scant white creamy discharge, vaginal walls and external genitalia normal Bimanual exam: Cervix 0/long/high, firm, anterior, neg CMT, uterus nontender, nonenlarged, adnexa without tenderness, enlargement, or mass  Dilation: Closed Effacement (%): Thick Cervical Position: Posterior Station: Ballotable Exam by:: Leftwich-Kirby, CNM  FHT:  Baseline 150 , moderate variability, accelerations present, no decelerations Contractions: q 10 mins, mild to palpation   Labs: Results for orders placed or performed during the hospital encounter of 04/05/18 (from the past 24 hour(s))  Urinalysis, Routine w reflex microscopic     Status: None   Collection Time: 04/05/18 12:38 PM  Result Value Ref Range   Color, Urine YELLOW YELLOW   APPearance CLEAR CLEAR   Specific Gravity, Urine 1.027 1.005 - 1.030   pH 7.0 5.0 - 8.0   Glucose, UA NEGATIVE NEGATIVE mg/dL   Hgb urine dipstick NEGATIVE NEGATIVE   Bilirubin Urine NEGATIVE NEGATIVE   Ketones, ur NEGATIVE NEGATIVE mg/dL   Protein, ur NEGATIVE NEGATIVE mg/dL   Nitrite  NEGATIVE NEGATIVE   Leukocytes, UA NEGATIVE NEGATIVE  Amnisure rupture of membrane (rom)not at [facility]     Status: None   Collection Time: 04/05/18  1:35 PM  Result Value Ref Range   Amnisure ROM NEGATIVE   Wet prep, genital     Status: Abnormal   Collection Time: 04/05/18  1:35 PM  Result Value Ref Range   Yeast Wet Prep HPF POC NONE SEEN NONE SEEN   Trich, Wet Prep NONE SEEN NONE SEEN   Clue Cells Wet Prep HPF POC NONE SEEN NONE SEEN   WBC, Wet Prep HPF POC FEW (A) NONE SEEN   Sperm NONE SEEN    A/Positive/-- (09/16 8416)  Imaging:    MAU Course/MDM: Orders Placed This Encounter  Procedures  . Wet prep, genital  . Urinalysis, Routine w reflex microscopic  . Amnisure rupture of membrane (rom)not at [facility] [place]  . Discharge patient    Meds ordered this encounter  Medications  . lactated ringers bolus 1,000 mL  . ondansetron (ZOFRAN) injection 4 mg  . metoCLOPramide (REGLAN) injection 10 mg  . famotidine (PEPCID) IVPB 20 mg premix  . terbutaline (BRETHINE) injection 0.25 mg     NST reviewed and reactive No evidence of preterm labor or rupture of membranes on exam today IV fluids given for ctx, reduced but not resolved, terbutaline SQ x 1 dose given.  IV Reglan, Pepcid, Zofran given for nausea, pt driving so no sedating medications given. Contractions resolved, pt reports minimal cramping and good fetal movement after fluids and medication. No additional vomiting after IV fluids and medications. Pt to continue home regimen for N/V, use suppositories and ODT Zofran if unable to keep meds down. Keep appt in office Return to MAU as needed for emergencies Pt discharge with strict return precautions.    Assessment: 1. Threatened preterm labor, third trimester   2. Obesity in pregnancy   3. Supervision of other normal pregnancy, antepartum   4. Encounter for suspected premature rupture of amniotic membranes, with rupture of membranes not found   5. Nausea and vomiting during  pregnancy   6. Decreased fetal movements in third trimester, single or unspecified fetus   7. NST (non-stress test) reactive     Plan: Discharge home Labor precautions and fetal kick counts Follow-up [place] for [facility]  [facility]care[facility]om[facility] Follow up.   Specialty:  Obstetrics and Gynecology Contact information: [place] [phone]         Allergies as of 04/05/2018      Reactions   Amoxicillin Nausea And Vomiting, Other (See Comments)   Tics & twitches Has patient had a PCN reaction causing immediate rash, facial/tongue/throat swelling, SOB or lightheadedness with hypotension: Yes Has patient had a PCN reaction causing severe rash involving mucus membranes or skin necrosis: No Has patient had a PCN reaction that required hospitalization No Has patient had a PCN reaction occurring within the last 10 years: Yes If all of the above answers are "NO", then may proceed with Cephalosporin use.   Contrast Media [iodinated Diagnostic Agents] Hives   Dilaudid [hydromorphone Hcl] Itching      Medication List    TAKE these medications   acetaminophen 325 MG tablet Commonly known as:  TYLENOL Take 650 mg by mouth every 6 (six) hours as needed for headache.   aspirin EC 81 MG tablet Take 1 tablet (81 mg total) by mouth daily.   COMFORT FIT MATERNITY SUPP LG Misc 1 Units by Does not apply route daily.   metoCLOPramide 10 MG tablet Commonly known as:  REGLAN Take 1 tablet (10 mg total) by mouth 3 (three) times daily before meals.   ondansetron 4 MG disintegrating tablet Commonly known as:  ZOFRAN ODT Take 1 tablet (4 mg total) by mouth every 6 (six) hours as needed for nausea.   pantoprazole 20 MG tablet Commonly known as:  PROTONIX Take 1 tablet (20 mg total) by mouth 2 (two) times daily.   prenatal vitamin w/FE, FA 27-1 MG Tabs tablet Take 1 tablet by mouth daily at 12 noon.   promethazine 25 MG suppository Commonly  known as:  PHENERGAN Place 1 suppository (25 mg total) rectally every 6 (six) hours as needed for nausea or vomiting.       Fatima Blank Certified Nurse-Midwife 04/05/2018 5:33 PM

## 2018-04-06 ENCOUNTER — Encounter: Payer: Self-pay | Admitting: Student

## 2018-04-06 ENCOUNTER — Telehealth: Payer: Self-pay | Admitting: Student

## 2018-04-06 NOTE — Telephone Encounter (Signed)
Called the patient to reschedule missed appointment. The patient was unavailable, left a voicemail and sent a missed appointment.

## 2018-04-07 ENCOUNTER — Encounter: Payer: Self-pay | Admitting: Medical

## 2018-04-07 ENCOUNTER — Ambulatory Visit (INDEPENDENT_AMBULATORY_CARE_PROVIDER_SITE_OTHER): Payer: Medicaid Other | Admitting: Medical

## 2018-04-07 VITALS — BP 109/71 | HR 99 | Wt 213.0 lb

## 2018-04-07 DIAGNOSIS — O219 Vomiting of pregnancy, unspecified: Secondary | ICD-10-CM

## 2018-04-07 DIAGNOSIS — Z3480 Encounter for supervision of other normal pregnancy, unspecified trimester: Secondary | ICD-10-CM

## 2018-04-07 DIAGNOSIS — O368130 Decreased fetal movements, third trimester, not applicable or unspecified: Secondary | ICD-10-CM | POA: Diagnosis present

## 2018-04-07 LAB — POCT URINALYSIS DIP (DEVICE)
Bilirubin Urine: NEGATIVE
Glucose, UA: NEGATIVE mg/dL
Hgb urine dipstick: NEGATIVE
Ketones, ur: NEGATIVE mg/dL
Leukocytes, UA: NEGATIVE
Nitrite: NEGATIVE
Protein, ur: 30 mg/dL — AB
Specific Gravity, Urine: >=1.03 (ref 1.005–1.030)
Urobilinogen, UA: 0.2 mg/dL (ref 0.0–1.0)
pH: 6 (ref 5.0–8.0)

## 2018-04-07 MED ORDER — MECLIZINE HCL 12.5 MG PO TABS: 12.5000 mg | ORAL_TABLET | Freq: Three times a day (TID) | ORAL | 0 refills | Status: DC | PRN

## 2018-04-07 NOTE — Patient Instructions (Signed)
Fetal Movement Counts Patient Name: ________________________________________________ Patient Due Date: ____________________ What is a fetal movement count?  A fetal movement count is the number of times that you feel your baby move during a certain amount of time. This may also be called a fetal kick count. A fetal movement count is recommended for every pregnant woman. You may be asked to start counting fetal movements as early as week 28 of your pregnancy. Pay attention to when your baby is most active. You may notice your baby's sleep and wake cycles. You may also notice things that make your baby move more. You should do a fetal movement count:  When your baby is normally most active.  At the same time each day. A good time to count movements is while you are resting, after having something to eat and drink. How do I count fetal movements? 1. Find a quiet, comfortable area. Sit, or lie down on your side. 2. Write down the date, the start time and stop time, and the number of movements that you felt between those two times. Take this information with you to your health care visits. 3. For 2 hours, count kicks, flutters, swishes, rolls, and jabs. You should feel at least 10 movements during 2 hours. 4. You may stop counting after you have felt 10 movements. 5. If you do not feel 10 movements in 2 hours, have something to eat and drink. Then, keep resting and counting for 1 hour. If you feel at least 4 movements during that hour, you may stop counting. Contact a health care provider if:  You feel fewer than 4 movements in 2 hours.  Your baby is not moving like he or she usually does. Date: ____________ Start time: ____________ Stop time: ____________ Movements: ____________ Date: ____________ Start time: ____________ Stop time: ____________ Movements: ____________ Date: ____________ Start time: ____________ Stop time: ____________ Movements: ____________ Date: ____________ Start time:  ____________ Stop time: ____________ Movements: ____________ Date: ____________ Start time: ____________ Stop time: ____________ Movements: ____________ Date: ____________ Start time: ____________ Stop time: ____________ Movements: ____________ Date: ____________ Start time: ____________ Stop time: ____________ Movements: ____________ Date: ____________ Start time: ____________ Stop time: ____________ Movements: ____________ Date: ____________ Start time: ____________ Stop time: ____________ Movements: ____________ This information is not intended to replace advice given to you by your health care provider. Make sure you discuss any questions you have with your health care provider. Document Released: 04/03/2006 Document Revised: 11/01/2015 Document Reviewed: 04/13/2015 Elsevier Interactive Patient Education  2019 [employer] Braxton Hicks Contractions Contractions of the uterus can occur throughout pregnancy, but they are not always a sign that you are in labor. You may have practice contractions called Braxton Hicks contractions. These false labor contractions are sometimes confused with true labor. What are Braxton Hicks contractions? Braxton Hicks contractions are tightening movements that occur in the muscles of the uterus before labor. Unlike true labor contractions, these contractions do not result in opening (dilation) and thinning of the cervix. Toward the end of pregnancy (32-34 weeks), Braxton Hicks contractions can happen more often and may become stronger. These contractions are sometimes difficult to tell apart from true labor because they can be very uncomfortable. You should not feel embarrassed if you go to the hospital with false labor. Sometimes, the only way to tell if you are in true labor is for your health care provider to look for changes in the cervix. The health care provider will do a physical exam and may monitor your contractions. If   you are not in true labor, the exam  should show that your cervix is not dilating and your water has not broken. If there are no other health problems associated with your pregnancy, it is completely safe for you to be sent home with false labor. You may continue to have Braxton Hicks contractions until you go into true labor. How to tell the difference between true labor and false labor True labor  Contractions last 30-70 seconds.  Contractions become very regular.  Discomfort is usually felt in the top of the uterus, and it spreads to the lower abdomen and low back.  Contractions do not go away with walking.  Contractions usually become more intense and increase in frequency.  The cervix dilates and gets thinner. False labor  Contractions are usually shorter and not as strong as true labor contractions.  Contractions are usually irregular.  Contractions are often felt in the front of the lower abdomen and in the groin.  Contractions may go away when you walk around or change positions while lying down.  Contractions get weaker and are shorter-lasting as time goes on.  The cervix usually does not dilate or become thin. Follow these instructions at home:   Take over-the-counter and prescription medicines only as told by your health care provider.  Keep up with your usual exercises and follow other instructions from your health care provider.  Eat and drink lightly if you think you are going into labor.  If Braxton Hicks contractions are making you uncomfortable: ? Change your position from lying down or resting to walking, or change from walking to resting. ? Sit and rest in a tub of warm water. ? Drink enough fluid to keep your urine pale yellow. Dehydration may cause these contractions. ? Do slow and deep breathing several times an hour.  Keep all follow-up prenatal visits as told by your health care provider. This is important. Contact a health care provider if:  You have a fever.  You have continuous  pain in your abdomen. Get help right away if:  Your contractions become stronger, more regular, and closer together.  You have fluid leaking or gushing from your vagina.  You pass blood-tinged mucus (bloody show).  You have bleeding from your vagina.  You have low back pain that you never had before.  You feel your baby's head pushing down and causing pelvic pressure.  Your baby is not moving inside you as much as it used to. Summary  Contractions that occur before labor are called Braxton Hicks contractions, false labor, or practice contractions.  Braxton Hicks contractions are usually shorter, weaker, farther apart, and less regular than true labor contractions. True labor contractions usually become progressively stronger and regular, and they become more frequent.  Manage discomfort from Braxton Hicks contractions by changing position, resting in a warm bath, drinking plenty of water, or practicing deep breathing. This information is not intended to replace advice given to you by your health care provider. Make sure you discuss any questions you have with your health care provider. Document Released: 07/18/2016 Document Revised: 12/17/2016 Document Reviewed: 07/18/2016 Elsevier Interactive Patient Education  2019 Elsevier Inc.  

## 2018-04-07 NOTE — Progress Notes (Signed)
   PRENATAL VISIT NOTE  Subjective:  Emma Chambers is a 28 y.o. [phone] at [redacted]w[redacted]d being seen today for ongoing prenatal care.  She is currently monitored for the following issues for this high-risk pregnancy and has H. pylori infection; Generalized headaches; Pica in adults; Ruptured ectopic pregnancy; Supervision of other normal pregnancy, antepartum; History of gestational hypertension; Obesity in pregnancy; and Hyperemesis gravidarum, antepartum on their problem list.  Patient reports nausea and vomiting.  Contractions: Irregular. Vag. Bleeding: None.  Movement: (!) Decreased. Denies leaking of fluid.   The following portions of the patient's history were reviewed and updated as appropriate: allergies, current medications, past family history, past medical history, past social history, past surgical history and problem list. Problem list updated.  Objective:   Vitals:   04/07/18 1409  BP: 109/71  Pulse: 99  Weight: 213 lb (96.6 kg)    Fetal Status: Fetal Heart Rate (bpm): 162 Fundal Height: 32 cm Movement: (!) Decreased     General:  Alert, oriented and cooperative. Patient is in no acute distress.  Skin: Skin is warm and dry. No rash noted.   Cardiovascular: Normal heart rate noted  Respiratory: Normal respiratory effort, no problems with respiration noted  Abdomen: Soft, gravid, appropriate for gestational age.  Pain/Pressure: Present     Pelvic: Cervical exam deferred        Extremities: Normal range of motion.  Edema: None  Mental Status: Normal mood and affect. Normal behavior. Normal judgment and thought content.   Assessment and Plan:  Pregnancy: [id number] at [redacted]w[redacted]d  1. Decreased fetal movements in third trimester, single or unspecified fetus - Fetal nonstress test, reassuring for GA  2. Nausea and vomiting during pregnancy - Discussed hot shower for relief due to recent discontinuation of MJ use - meclizine (ANTIVERT) 12.5 MG tablet; Take 1 tablet (12.5 mg total) by  mouth 3 (three) times daily as needed for dizziness.  Dispense: 30 tablet; Refill: 0  Preterm labor symptoms and general obstetric precautions including but not limited to vaginal bleeding, contractions, leaking of fluid and fetal movement were reviewed in detail with the patient. Please refer to After Visit Summary for other counseling recommendations.  No follow-ups on file.  Future Appointments  Date Time Provider [place]  04/20/2018  9:30 AM [facility] [place]    Kerry Hough, PA-C

## 2018-04-15 NOTE — Progress Notes (Signed)
NST from 04/07/18 Fetal Monitoring: Baseline: 140 bpm Variability: moderate Accelerations: 10 x 10s Decelerations: none Contractions: none  Luvenia Redden, PA-C 04/15/2018 8:24 AM

## 2018-04-16 ENCOUNTER — Inpatient Hospital Stay (HOSPITAL_COMMUNITY)
Admission: AD | Admit: 2018-04-16 | Discharge: 2018-04-16 | Disposition: A | Discharge status: Home / Self Care | Payer: Medicaid Other | Attending: Obstetrics and Gynecology | Admitting: Obstetrics and Gynecology

## 2018-04-16 ENCOUNTER — Other Ambulatory Visit: Payer: Self-pay

## 2018-04-16 ENCOUNTER — Encounter (HOSPITAL_COMMUNITY): Payer: Self-pay | Admitting: *Deleted

## 2018-04-16 DIAGNOSIS — F1210 Cannabis abuse, uncomplicated: Secondary | ICD-10-CM

## 2018-04-16 DIAGNOSIS — Z87891 Personal history of nicotine dependence: Secondary | ICD-10-CM | POA: Insufficient documentation

## 2018-04-16 DIAGNOSIS — O219 Vomiting of pregnancy, unspecified: Secondary | ICD-10-CM

## 2018-04-16 DIAGNOSIS — O212 Late vomiting of pregnancy: Secondary | ICD-10-CM | POA: Insufficient documentation

## 2018-04-16 DIAGNOSIS — Z3A30 30 weeks gestation of pregnancy: Secondary | ICD-10-CM | POA: Diagnosis not present

## 2018-04-16 DIAGNOSIS — O99210 Obesity complicating pregnancy, unspecified trimester: Secondary | ICD-10-CM | POA: Diagnosis not present

## 2018-04-16 DIAGNOSIS — Z3480 Encounter for supervision of other normal pregnancy, unspecified trimester: Secondary | ICD-10-CM

## 2018-04-16 DIAGNOSIS — Z880 Allergy status to penicillin: Secondary | ICD-10-CM | POA: Insufficient documentation

## 2018-04-16 LAB — RAPID URINE DRUG SCREEN, HOSP PERFORMED
Amphetamines: NOT DETECTED
Barbiturates: NOT DETECTED
Benzodiazepines: NOT DETECTED
Cocaine: NOT DETECTED
Opiates: NOT DETECTED
Tetrahydrocannabinol: POSITIVE — AB

## 2018-04-16 LAB — URINALYSIS, ROUTINE W REFLEX MICROSCOPIC
Bilirubin Urine: NEGATIVE
Glucose, UA: NEGATIVE mg/dL
Hgb urine dipstick: NEGATIVE
Ketones, ur: 15 mg/dL — AB
Leukocytes, UA: NEGATIVE
Nitrite: NEGATIVE
Protein, ur: NEGATIVE mg/dL
Specific Gravity, Urine: 1.025 (ref 1.005–1.030)
pH: 6.5 (ref 5.0–8.0)

## 2018-04-16 MED ORDER — METOCLOPRAMIDE HCL 10 MG PO TABS
10.0000 mg | ORAL_TABLET | Freq: Once | ORAL | Status: AC
  Administered 2018-04-16: 10 mg via ORAL
  Filled 2018-04-16: qty 1

## 2018-04-16 NOTE — Discharge Instructions (Signed)
Follow these instructions at home: Eating and drinking   Avoid the following: ? Drinking fluids with meals. Try not to drink anything during the 30 minutes before and after your meals. ? Drinking more than 1 cup of fluid at a time. ? Eating foods that trigger your symptoms. These may include spicy foods, coffee, high-fat foods, very sweet foods, and acidic foods. ? Skipping meals. Nausea can be more intense on an empty stomach. If you cannot tolerate food, do not force it. Try sucking on ice chips or other frozen items and make up for missed calories later. ? Lying down within 2 hours after eating. ? Being exposed to environmental triggers. These may include food smells, smoky rooms, closed spaces, rooms with strong smells, warm or humid places, overly loud and noisy rooms, and rooms with motion or flickering lights. Try eating meals in a well-ventilated area that is free of strong smells. ? Quick and sudden changes in your movement. ? Taking iron pills and multivitamins that contain iron. If you take prescription iron pills, do not stop taking them unless your health care provider approves. ? Preparing food. The smell of food can spoil your appetite or trigger nausea.  To help relieve your symptoms: ? Listen to your body. Everyone is different and has different preferences. Find what works best for you. ? Eat and drink slowly. ? Eat 5-6 small meals daily instead of 3 large meals. Eating small meals and snacks can help you avoid an empty stomach. ? In the morning, before getting out of bed, eat a couple of crackers to avoid moving around on an empty stomach. ? Try eating starchy foods as these are usually tolerated well. Examples include cereal, toast, bread, potatoes, pasta, rice, and pretzels. ? Include at least 1 serving of protein with your meals and snacks. Protein options include lean meats, poultry, seafood, beans, nuts, nut butters, eggs, cheese, and yogurt. ? Try eating a protein-rich  snack before bed. Examples of a protein-rick snack include cheese and crackers or a peanut butter sandwich made with 1 slice of whole-wheat bread and 1 tsp (5 g) of peanut butter. ? Eat or suck on things that have ginger in them. It may help relieve nausea. Add  tsp ground ginger to hot tea or choose ginger tea. ? Try drinking 100% fruit juice or an electrolyte drink. An electrolyte drink contains sodium, potassium, and chloride. ? Drink fluids that are cold, clear, and carbonated or sour. Examples include lemonade, ginger ale, lemon-lime soda, ice water, and sparkling water. ? Brush your teeth or use a mouth rinse after meals. ? Talk with your health care provider about starting a supplement of vitamin B6. General instructions  Take over-the-counter and prescription medicines only as told by your health care provider.  Follow instructions from your health care provider about eating or drinking restrictions.  Continue to take your prenatal vitamins as told by your health care provider. If you are having trouble taking your prenatal vitamins, talk with your health care provider about different options.  Keep all follow-up and pre-birth (prenatal) visits as told by your health care provider. This is important. Contact a health care provider if:  You have pain in your abdomen.  You have a severe headache.  You have vision problems.  You are losing weight.  You feel weak or dizzy. Get help right away if:  You cannot drink fluids without vomiting.  You vomit blood.  You have constant nausea and vomiting.  You are  very weak.  You faint.  You have a fever and your symptoms suddenly get worse. Summary  Making some changes to your eating habits may help relieve nausea and vomiting.  This condition may be managed with medicine.  If medicines do not help relieve nausea and vomiting, you may need to receive fluids through an IV at the hospital. This information is not intended to  replace advice given to you by your health care provider. Make sure you discuss any questions you have with your health care provider. Document Released: 03/04/2005 Document Revised: 03/24/2017 Document Reviewed: 11/01/2015 Elsevier Interactive Patient Education  2019 [employer].

## 2018-04-16 NOTE — MAU Provider Note (Signed)
History     CSN: [id number]  Arrival date and time: 04/16/18 6269   First Provider Initiated Contact with Patient 04/16/18 0957      Chief Complaint  Patient presents with  . Emesis  . Contractions   HPI Emma Chambers is a 28 y.o. [phone] at [redacted]w[redacted]d who presents to MAU with chief complaint of nausea and vomiting. This is a recurring problem this pregnancy. Patient endorses taking Zofran and Phenergan together this morning without relief. She is tolerating PO hydration and endorses being able to tolerate "baked goods" and fruit popsicles.  Patient endorses mild abdominal cramping that coincides with episodes of vomiting. She denies vaginal bleeding, abnormal vaginal discharge, decreased fetal movement, dysuria, fever or recent illness.  Patient denies THC use. She states her boyfriend lives with her, she wore his shoes to MAU, and his shoes are the source of the [facility] smell in her MAU room.  OB History    Gravida  4   Para  2   Term  1   Preterm  1   AB  1   Living  2     SAB  0   TAB  0   Ectopic  1   Multiple  0   Live Births  2        Obstetric Comments  2014: IOL at 46 weeks 2/2 gallstones 2013: GHTN 2016: ectopic pregnancy: Had methotrexate and then had surgery         Past Medical History:  Diagnosis Date  . Anemia   . Gallstones   . H. pylori infection   . Headache(784.0)    chronic  . Kidney stones   . Ovarian cyst   . Pregnancy induced hypertension   . UTI (lower urinary tract infection)     Past Surgical History:  Procedure Laterality Date  . CHOLECYSTECTOMY N/A 07/27/2012   Procedure: LAPAROSCOPIC CHOLECYSTECTOMY WITH INTRAOPERATIVE CHOLANGIOGRAM;  Surgeon: Madilyn Hook, DO;  Location: [facility];  Service: General;  Laterality: N/A;  . DIAGNOSTIC LAPAROSCOPY WITH REMOVAL OF ECTOPIC PREGNANCY N/A 12/27/2014   Procedure: DIAGNOSTIC LAPAROSCOPY, Right Salpingectomy with removal ECTOPIC PREGNANCY;  Surgeon: [name], MD;  Location: [facility]  [facility];  Service: Gynecology;  Laterality: N/A;  . LAPAROSCOPIC APPENDECTOMY N/A 12/09/2012   Procedure: APPENDECTOMY LAPAROSCOPIC;  Surgeon: Gwenyth Ober, MD;  Location: [facility] OR;  Service: General;  Laterality: N/A;    Family History  Problem Relation Age of Onset  . Asthma Mother   . Hypertension Mother   . Parkinsonism Mother   . Asthma Brother   . Diabetes Maternal Grandmother   . Hypertension Maternal Grandmother   . Stroke Maternal Grandmother   . Heart disease Maternal Grandmother   . Lupus Maternal Aunt   . Heart disease Maternal Grandfather   . Anesthesia problems Neg Hx   . Malignant hyperthermia Neg Hx   . Pseudochol deficiency Neg Hx   . Hypotension Neg Hx     Social History   Tobacco Use  . Smoking status: Former Smoker    Types: Cigarettes  . Smokeless tobacco: Never Used  . Tobacco comment: 2014  Substance Use Topics  . Alcohol use: Not Currently    Comment: social  . Drug use: No    Allergies:  Allergies  Allergen Reactions  . Amoxicillin Nausea And Vomiting and Other (See Comments)    Tics & twitches Has patient had a PCN reaction causing immediate rash, facial/tongue/throat swelling, SOB or lightheadedness with hypotension: Yes  Has patient had a PCN reaction causing severe rash involving mucus membranes or skin necrosis: No Has patient had a PCN reaction that required hospitalization No Has patient had a PCN reaction occurring within the last 10 years: Yes If all of the above answers are "NO", then may proceed with Cephalosporin use.  . Contrast Media [Iodinated Diagnostic Agents] Hives  . Dilaudid [Hydromorphone Hcl] Itching    Medications Prior to Admission  Medication Sig Dispense Refill Last Dose  . acetaminophen (TYLENOL) 325 MG tablet Take 650 mg by mouth every 6 (six) hours as needed for headache.   Taking  . aspirin EC 81 MG tablet Take 1 tablet (81 mg total) by mouth daily. (Patient not taking: Reported on 04/07/2018) 30 tablet 11 Not Taking   . Elastic Bandages & Supports (COMFORT FIT MATERNITY SUPP LG) MISC 1 Units by Does not apply route daily. (Patient not taking: Reported on 03/24/2018) 1 each 0 Not Taking  . meclizine (ANTIVERT) 12.5 MG tablet Take 1 tablet (12.5 mg total) by mouth 3 (three) times daily as needed for dizziness. 30 tablet 0   . metoCLOPramide (REGLAN) 10 MG tablet Take 1 tablet (10 mg total) by mouth 3 (three) times daily before meals. 90 tablet 2 Taking  . ondansetron (ZOFRAN ODT) 4 MG disintegrating tablet Take 1 tablet (4 mg total) by mouth every 6 (six) hours as needed for nausea. 20 tablet 0 Taking  . pantoprazole (PROTONIX) 20 MG tablet Take 1 tablet (20 mg total) by mouth 2 (two) times daily. (Patient not taking: Reported on 04/07/2018) 60 tablet 6 Not Taking  . prenatal vitamin w/FE, FA (PRENATAL 1 + 1) 27-1 MG TABS tablet Take 1 tablet by mouth daily at 12 noon. 30 each 11 Taking  . promethazine (PHENERGAN) 25 MG suppository Place 1 suppository (25 mg total) rectally every 6 (six) hours as needed for nausea or vomiting. (Patient not taking: Reported on 04/07/2018) 12 each 0 Not Taking    Review of Systems  Constitutional: Negative for chills, fatigue and fever.  Respiratory: Negative for chest tightness and shortness of breath.   Gastrointestinal: Positive for abdominal pain, nausea and vomiting.  Genitourinary: Negative for difficulty urinating, dyspareunia, dysuria, flank pain, vaginal bleeding, vaginal discharge and vaginal pain.  Musculoskeletal: Negative for back pain.  Neurological: Negative for dizziness, seizures, weakness and headaches.  All other systems reviewed and are negative.  Physical Exam   Blood pressure 123/61, pulse 99, temperature 98.8 F (37.1 C), temperature source Oral, resp. rate 18, weight 95.7 kg, last menstrual period 09/15/2017, SpO2 100 %.  Physical Exam  Nursing note and vitals reviewed. Constitutional: She is oriented to person, place, and time. She appears  well-developed and well-nourished.  Cardiovascular: Normal rate.  Respiratory: Effort normal.  GI: She exhibits no distension. There is no abdominal tenderness. There is no rebound, no guarding and no CVA tenderness.  Gravid  Genitourinary:    Genitourinary Comments: SVE: closed/thick/ballotable   Musculoskeletal: Normal range of motion.  Neurological: She is alert and oriented to person, place, and time.  Skin: Skin is warm and dry.  Psychiatric: She has a normal mood and affect. Her behavior is normal. Judgment and thought content normal.    MAU Course/MDM   --Reactive tracing: baseline 150, moderate variability, positive 10 x 10 accelerations, no decels --Toco: non contractions, rare uterine irritability  Patient Vitals for the past 24 hrs:  BP Temp Temp src Pulse Resp SpO2 Weight  04/16/18 0943 123/61 98.8 F (37.1 C)  Oral 99 18 100 % 95.7 kg    Results for orders placed or performed during the hospital encounter of 04/16/18 (from the past 24 hour(s))  Urinalysis, Routine w reflex microscopic     Status: Abnormal   Collection Time: 04/16/18  9:58 AM  Result Value Ref Range   Color, Urine YELLOW YELLOW   APPearance HAZY (A) CLEAR   Specific Gravity, Urine 1.025 1.005 - 1.030   pH 6.5 5.0 - 8.0   Glucose, UA NEGATIVE NEGATIVE mg/dL   Hgb urine dipstick NEGATIVE NEGATIVE   Bilirubin Urine NEGATIVE NEGATIVE   Ketones, ur 15 (A) NEGATIVE mg/dL   Protein, ur NEGATIVE NEGATIVE mg/dL   Nitrite NEGATIVE NEGATIVE   Leukocytes, UA NEGATIVE NEGATIVE  Urine rapid drug screen (hosp performed)     Status: Abnormal   Collection Time: 04/16/18 10:05 AM  Result Value Ref Range   Opiates NONE DETECTED NONE DETECTED   Cocaine NONE DETECTED NONE DETECTED   Benzodiazepines NONE DETECTED NONE DETECTED   Amphetamines NONE DETECTED NONE DETECTED   Tetrahydrocannabinol POSITIVE (A) NONE DETECTED   Barbiturates NONE DETECTED NONE DETECTED   Assessment and Plan  --28 y.o. [id number] at [redacted]w[redacted]d   --Reactive tracing, closed cervix --Discussed diet modification in addition to PO meds previously prescribed --Greater than 20 minutes spent at bedside discussing cannabis hyperemesis. Pt emphatically denies use --Discharge home in stable condition  Darlina Rumpf, CNM 04/16/2018, 11:32 AM

## 2018-04-16 NOTE — MAU Note (Signed)
Can't stop vomiting and having contractions. Gotten worse last couple days

## 2018-04-20 ENCOUNTER — Ambulatory Visit (HOSPITAL_COMMUNITY)
Admission: RE | Admit: 2018-04-20 | Discharge: 2018-04-20 | Disposition: A | Discharge status: Home / Self Care | Payer: Medicaid Other | Source: Ambulatory Visit | Attending: Obstetrics and Gynecology | Admitting: Obstetrics and Gynecology

## 2018-04-20 ENCOUNTER — Other Ambulatory Visit (HOSPITAL_COMMUNITY): Payer: Self-pay | Admitting: *Deleted

## 2018-04-20 ENCOUNTER — Encounter (HOSPITAL_COMMUNITY): Payer: Self-pay

## 2018-04-20 DIAGNOSIS — O99213 Obesity complicating pregnancy, third trimester: Secondary | ICD-10-CM

## 2018-04-20 DIAGNOSIS — E669 Obesity, unspecified: Secondary | ICD-10-CM

## 2018-04-20 DIAGNOSIS — O09219 Supervision of pregnancy with history of pre-term labor, unspecified trimester: Secondary | ICD-10-CM | POA: Diagnosis not present

## 2018-04-20 DIAGNOSIS — Z3A31 31 weeks gestation of pregnancy: Secondary | ICD-10-CM

## 2018-04-20 DIAGNOSIS — O09299 Supervision of pregnancy with other poor reproductive or obstetric history, unspecified trimester: Secondary | ICD-10-CM

## 2018-04-29 ENCOUNTER — Ambulatory Visit (INDEPENDENT_AMBULATORY_CARE_PROVIDER_SITE_OTHER): Payer: Medicaid Other | Admitting: Medical

## 2018-04-29 VITALS — BP 128/79 | HR 101 | Wt 209.6 lb

## 2018-04-29 DIAGNOSIS — Z8759 Personal history of other complications of pregnancy, childbirth and the puerperium: Secondary | ICD-10-CM

## 2018-04-29 DIAGNOSIS — B009 Herpesviral infection, unspecified: Secondary | ICD-10-CM

## 2018-04-29 DIAGNOSIS — Z3480 Encounter for supervision of other normal pregnancy, unspecified trimester: Secondary | ICD-10-CM

## 2018-04-29 DIAGNOSIS — O210 Mild hyperemesis gravidarum: Secondary | ICD-10-CM

## 2018-04-29 DIAGNOSIS — Z3A32 32 weeks gestation of pregnancy: Secondary | ICD-10-CM

## 2018-04-29 DIAGNOSIS — Z3483 Encounter for supervision of other normal pregnancy, third trimester: Secondary | ICD-10-CM

## 2018-04-29 HISTORY — DX: Herpesviral infection, unspecified: B00.9

## 2018-04-29 NOTE — Patient Instructions (Signed)

## 2018-04-29 NOTE — Progress Notes (Signed)
   PRENATAL VISIT NOTE  Subjective:  Emma Chambers is a 28 y.o. [phone] at [redacted]w[redacted]d being seen today for ongoing prenatal care.  She is currently monitored for the following issues for this low-risk pregnancy and has H. pylori infection; Generalized headaches; Pica in adults; Ruptured ectopic pregnancy; Supervision of other normal pregnancy, antepartum; History of gestational hypertension; Obesity in pregnancy; Hyperemesis gravidarum, antepartum; Mild tetrahydrocannabinol (THC) abuse; and HSV (herpes simplex virus) infection on their problem list.  Patient reports nausea, occasional contractions and vomiting.  Contractions: Irritability. Vag. Bleeding: None.  Movement: Present. Denies leaking of fluid.   The following portions of the patient's history were reviewed and updated as appropriate: allergies, current medications, past family history, past medical history, past social history, past surgical history and problem list. Problem list updated.  Objective:   Vitals:   04/29/18 1026  BP: 128/79  Pulse: (!) 101  Weight: 209 lb 9.6 oz (95.1 kg)    Fetal Status: Fetal Heart Rate (bpm): 159 Fundal Height: 33 cm Movement: Present     General:  Alert, oriented and cooperative. Patient is in no acute distress.  Skin: Skin is warm and dry. No rash noted.   Cardiovascular: Normal heart rate noted  Respiratory: Normal respiratory effort, no problems with respiration noted  Abdomen: Soft, gravid, appropriate for gestational age.  Pain/Pressure: Present     Pelvic: Cervical exam deferred        Extremities: Normal range of motion.  Edema: Trace  Mental Status: Normal mood and affect. Normal behavior. Normal judgment and thought content.   Assessment and Plan:  Pregnancy: [id number] at [redacted]w[redacted]d  1. Supervision of other normal pregnancy, antepartum - BH contractions noted up to 7 per hours at time, seen in MAU and cervix was closed. No contractions today.   2. History of gestational  hypertension - Normotensive today   3. Hyperemesis gravidarum, antepartum - Seen in MAU for N/V since last visit - Trying to make dietary changes and able to tolerate water and Gatorade most days  4. HSV (herpes simplex virus) infection - Tested positive in the past, no current outbreak - Advised of need for ppx at 36 weeks, patient agrees   Preterm labor symptoms and general obstetric precautions including but not limited to vaginal bleeding, contractions, leaking of fluid and fetal movement were reviewed in detail with the patient. Please refer to After Visit Summary for other counseling recommendations.  Return in about 2 weeks (around 05/13/2018) for LOB.  Future Appointments  Date Time Provider [place]  06/01/2018  9:30 AM [facility] [place]    Kerry Hough, PA-C

## 2018-05-15 ENCOUNTER — Encounter: Payer: Self-pay | Admitting: Certified Nurse Midwife

## 2018-05-20 ENCOUNTER — Encounter: Payer: Self-pay | Admitting: Obstetrics & Gynecology

## 2018-05-20 ENCOUNTER — Ambulatory Visit (INDEPENDENT_AMBULATORY_CARE_PROVIDER_SITE_OTHER): Payer: Medicaid Other | Admitting: Nurse Practitioner

## 2018-05-20 VITALS — BP 126/76 | HR 115 | Wt 214.0 lb

## 2018-05-20 DIAGNOSIS — O210 Mild hyperemesis gravidarum: Secondary | ICD-10-CM

## 2018-05-20 DIAGNOSIS — Z3A35 35 weeks gestation of pregnancy: Secondary | ICD-10-CM

## 2018-05-20 DIAGNOSIS — Z3480 Encounter for supervision of other normal pregnancy, unspecified trimester: Secondary | ICD-10-CM

## 2018-05-20 MED ORDER — PROMETHAZINE HCL 25 MG PO TABS: 25.0000 mg | ORAL_TABLET | Freq: Four times a day (QID) | ORAL | 1 refills | Status: DC | PRN

## 2018-05-20 MED ORDER — VALACYCLOVIR HCL 500 MG PO TABS: 500.0000 mg | ORAL_TABLET | Freq: Two times a day (BID) | ORAL | 2 refills | Status: DC

## 2018-05-20 NOTE — Progress Notes (Signed)
    Subjective:  Emma Chambers is a 28 y.o. [phone] at [redacted]w[redacted]d being seen today for ongoing prenatal care.  She is currently monitored for the following issues for this low-risk pregnancy and has H. pylori infection; Generalized headaches; Pica in adults; Ruptured ectopic pregnancy; Supervision of other normal pregnancy, antepartum; History of gestational hypertension; Obesity in pregnancy; Hyperemesis gravidarum, antepartum; Mild tetrahydrocannabinol (THC) abuse; and HSV (herpes simplex virus) infection on their problem list.  Patient reports vomiting. States her meds do not help and she is not taking them any longer.  Contractions: Irregular. Vag. Bleeding: None.  Movement: Present. Denies leaking of fluid.   The following portions of the patient's history were reviewed and updated as appropriate: allergies, current medications, past family history, past medical history, past social history, past surgical history and problem list. Problem list updated.  Objective:   Vitals:   05/20/18 0926  BP: 126/76  Pulse: (!) 115  Weight: 214 lb (97.1 kg)    Fetal Status: Fetal Heart Rate (bpm): 152 Fundal Height: 36 cm Movement: Present     General:  Alert, oriented and cooperative. Patient is in no acute distress.  Skin: Skin is warm and dry. No rash noted.   Cardiovascular: Normal heart rate noted  Respiratory: Normal respiratory effort, no problems with respiration noted  Abdomen: Soft, gravid, appropriate for gestational age. Pain/Pressure: Present     Pelvic:  Cervical exam deferred        Extremities: Normal range of motion.  Edema: Trace  Mental Status: Normal mood and affect. Normal behavior. Normal judgment and thought content.   Urinalysis:      Assessment and Plan:  Pregnancy: [id number] at [redacted]w[redacted]d  1. Supervision of other normal pregnancy, antepartum Very hot, no sweating, O2 sat 100%, temp 98.8, pulse 116,  Prescribed valtrex and discussed taking BID.  2. Hyperemesis gravidarum,  antepartum Continues to have vomiting - has stopped using all meds.  Encouraged to restart Phenergan and reviewed the way it works - only works for 4-6 hours and then she will need to take another dose.  Client reports she will try her meds again for vomiting and for heartburn.  Did not discuss marijuana use but if still using that may be making vomiting worse.  Preterm labor symptoms and general obstetric precautions including but not limited to vaginal bleeding, contractions, leaking of fluid and fetal movement were reviewed in detail with the patient. Please refer to After Visit Summary for other counseling recommendations.  Return in about 1 week (around 05/27/2018).  Earlie Server, RN, MSN, NP-BC Nurse Practitioner, [facility] for [employer] 05/20/2018 9:57 AM

## 2018-05-25 ENCOUNTER — Inpatient Hospital Stay (HOSPITAL_COMMUNITY)
Admission: AD | Admit: 2018-05-25 | Discharge: 2018-05-25 | Disposition: A | Discharge status: Home / Self Care | Payer: Medicaid Other | Attending: Obstetrics and Gynecology | Admitting: Obstetrics and Gynecology

## 2018-05-25 ENCOUNTER — Encounter (HOSPITAL_COMMUNITY): Payer: Self-pay

## 2018-05-25 ENCOUNTER — Other Ambulatory Visit: Payer: Self-pay

## 2018-05-25 DIAGNOSIS — O99013 Anemia complicating pregnancy, third trimester: Secondary | ICD-10-CM | POA: Diagnosis not present

## 2018-05-25 DIAGNOSIS — Z87442 Personal history of urinary calculi: Secondary | ICD-10-CM | POA: Insufficient documentation

## 2018-05-25 DIAGNOSIS — Z79899 Other long term (current) drug therapy: Secondary | ICD-10-CM | POA: Diagnosis not present

## 2018-05-25 DIAGNOSIS — O26893 Other specified pregnancy related conditions, third trimester: Secondary | ICD-10-CM | POA: Diagnosis not present

## 2018-05-25 DIAGNOSIS — O4703 False labor before 37 completed weeks of gestation, third trimester: Secondary | ICD-10-CM | POA: Insufficient documentation

## 2018-05-25 DIAGNOSIS — Z87891 Personal history of nicotine dependence: Secondary | ICD-10-CM | POA: Diagnosis not present

## 2018-05-25 DIAGNOSIS — Z880 Allergy status to penicillin: Secondary | ICD-10-CM | POA: Diagnosis not present

## 2018-05-25 DIAGNOSIS — Z885 Allergy status to narcotic agent status: Secondary | ICD-10-CM | POA: Insufficient documentation

## 2018-05-25 DIAGNOSIS — R0602 Shortness of breath: Secondary | ICD-10-CM | POA: Diagnosis not present

## 2018-05-25 DIAGNOSIS — Z3689 Encounter for other specified antenatal screening: Secondary | ICD-10-CM

## 2018-05-25 DIAGNOSIS — O26899 Other specified pregnancy related conditions, unspecified trimester: Secondary | ICD-10-CM

## 2018-05-25 DIAGNOSIS — Z3A36 36 weeks gestation of pregnancy: Secondary | ICD-10-CM

## 2018-05-25 LAB — CBC
HCT: 30.1 % — ABNORMAL LOW (ref 36.0–46.0)
Hemoglobin: 9.8 g/dL — ABNORMAL LOW (ref 12.0–15.0)
MCH: 26.8 pg (ref 26.0–34.0)
MCHC: 32.6 g/dL (ref 30.0–36.0)
MCV: 82.5 fL (ref 80.0–100.0)
Platelets: 325 10*3/uL (ref 150–400)
RBC: 3.65 MIL/uL — ABNORMAL LOW (ref 3.87–5.11)
RDW: 13.9 % (ref 11.5–15.5)
WBC: 11.2 10*3/uL — ABNORMAL HIGH (ref 4.0–10.5)
nRBC: 0 % (ref 0.0–0.2)

## 2018-05-25 MED ORDER — POLYSACCHARIDE IRON COMPLEX 150 MG PO CAPS: 150.0000 mg | ORAL_CAPSULE | Freq: Every day | ORAL | 1 refills | Status: DC

## 2018-05-25 NOTE — Discharge Instructions (Signed)
Pregnancy and Anemia ° °Anemia is a condition in which the concentration of red blood cells, or hemoglobin, in the blood is below normal. Hemoglobin is a substance in red blood cells that carries oxygen to the tissues of the body. Anemia results when enough oxygen does not reach these tissues. °Anemia is common during pregnancy because the woman's body needs more blood volume and blood cells to provide nutrition to the fetus. The fetus needs iron and folic acid as it is developing. Your body may not produce enough red blood cells because of this. Also, during pregnancy, the liquid part of the blood (plasma) increases by about 30-50%, and the red blood cells increase by only 20%. This lowers the concentration of the red blood cells and creates a natural anemia-like situation. °What are the causes? °The most common cause of anemia during pregnancy is not having enough iron in the body to make red blood cells (iron deficiency anemia). Other causes may include: °· Folic acid deficiency. °· Vitamin B12 deficiency. °· Certain prescription or over-the-counter medicines. °· Certain medical conditions or infections that destroy red blood cells. °· A low platelet count and bleeding caused by antibodies that go through the placenta to the fetus from the mother’s blood. °What are the signs or symptoms? °Mild anemia may not be noticeable. If it becomes severe, symptoms may include: °· Feeling tired (fatigue). °· Shortness of breath, especially during activity. °· Weakness. °· Fainting. °· Pale looking skin. °· Headaches. °· A fast or irregular heartbeat (palpitations). °· Dizziness. °How is this diagnosed? °This condition may be diagnosed based on: °· Your medical history and a physical exam. °· Blood tests. °How is this treated? °Treatment for anemia during pregnancy depends on the cause of the anemia. Treatment can include: °· Dietary changes. °· Supplements of iron, vitamin B12, or folic acid. °· A blood transfusion. This may  be needed if anemia is severe. °· Hospitalization. This may be needed if there is a lot of blood loss or severe anemia. °Follow these instructions at home: °· Follow recommendations from your dietitian or health care provider about changing your diet. °· Increase your vitamin C intake. This will help the stomach absorb more iron. Some foods that are high in vitamin C include: °? Oranges. °? Peppers. °? Tomatoes. °? Mangoes. °· Eat a diet rich in iron. This would include foods such as: °? Liver. °? Beef. °? Eggs. °? Whole grains. °? Spinach. °? Dried fruit. °· Take iron and vitamins as told by your health care provider. °· Eat green leafy vegetables. These are a good source of folic acid. °· Keep all follow-up visits as told by your health care provider. This is important. °Contact a health care provider if: °· You have frequent or lasting headaches. °· You look pale. °· You bruise easily. °Get help right away if: °· You have extreme weakness, shortness of breath, or chest pain. °· You become dizzy or have trouble concentrating. °· You have heavy vaginal bleeding. °· You develop a rash. °· You have bloody or black, tarry stools. °· You faint. °· You vomit up blood. °· You vomit repeatedly. °· You have abdominal pain. °· You have a fever. °· You are dehydrated. °Summary °· Anemia is a condition in which the concentration of red blood cells or hemoglobin in the blood is below normal. °· Anemia is common during pregnancy because the woman's body needs more blood volume and blood cells to provide nutrition to the fetus. °· The most   common cause of anemia during pregnancy is not having enough iron in the body to make red blood cells (iron deficiency anemia).  Mild anemia may not be noticeable. If it becomes severe, symptoms may include feeling tired and weak. This information is not intended to replace advice given to you by your health care provider. Make sure you discuss any questions you have with your health care  provider. Document Released: 03/01/2000 Document Revised: 04/09/2016 Document Reviewed: 04/09/2016 Elsevier Interactive Patient Education  2019 [employer].

## 2018-05-25 NOTE — MAU Note (Signed)
Contractions started around 10, 'back to back'. Sob started around 8. Denies bleeding or leaking

## 2018-05-25 NOTE — MAU Provider Note (Signed)
History     CSN: [id number]  Arrival date and time: 05/25/18 1203   First Provider Initiated Contact with Patient 05/25/18 1242      Chief Complaint  Patient presents with  . Contractions  . Shortness of Breath   [id number] @36 .0 weeks presenting with SOB and ctx. SOB started 1 week ago. Occurs intermittently at rest and while ambulating. No CP. No cough. No fever. No hx asthma. Ctx started this am around 10. Unsure of frequency. Denies VB or LOF. +FM.    OB History    Gravida  4   Para  2   Term  1   Preterm  1   AB  1   Living  2     SAB  0   TAB  0   Ectopic  1   Multiple  0   Live Births  2        Obstetric Comments  2014: IOL at 58 weeks 2/2 gallstones 2013: GHTN 2016: ectopic pregnancy: Had methotrexate and then had surgery         Past Medical History:  Diagnosis Date  . Anemia   . Gallstones   . H. pylori infection   . Headache(784.0)    chronic  . Kidney stones   . Ovarian cyst   . Pregnancy induced hypertension   . UTI (lower urinary tract infection)     Past Surgical History:  Procedure Laterality Date  . APPENDECTOMY    . CHOLECYSTECTOMY N/A 07/27/2012   Procedure: LAPAROSCOPIC CHOLECYSTECTOMY WITH INTRAOPERATIVE CHOLANGIOGRAM;  Surgeon: Madilyn Hook, DO;  Location: [facility];  Service: General;  Laterality: N/A;  . DIAGNOSTIC LAPAROSCOPY WITH REMOVAL OF ECTOPIC PREGNANCY N/A 12/27/2014   Procedure: DIAGNOSTIC LAPAROSCOPY, Right Salpingectomy with removal ECTOPIC PREGNANCY;  Surgeon: [name], MD;  Location: [facility];  Service: Gynecology;  Laterality: N/A;  . LAPAROSCOPIC APPENDECTOMY N/A 12/09/2012   Procedure: APPENDECTOMY LAPAROSCOPIC;  Surgeon: Gwenyth Ober, MD;  Location: [facility];  Service: General;  Laterality: N/A;    Family History  Problem Relation Age of Onset  . Asthma Mother   . Hypertension Mother   . Parkinsonism Mother   . Asthma Brother   . Diabetes Maternal Grandmother   . Hypertension Maternal Grandmother   .  Stroke Maternal Grandmother   . Heart disease Maternal Grandmother   . Lupus Maternal Aunt   . Heart disease Maternal Grandfather   . Anesthesia problems Neg Hx   . Malignant hyperthermia Neg Hx   . Pseudochol deficiency Neg Hx   . Hypotension Neg Hx     Social History   Tobacco Use  . Smoking status: Former Smoker    Types: Cigarettes  . Smokeless tobacco: Never Used  . Tobacco comment: 2014  Substance Use Topics  . Alcohol use: Never    Frequency: Never    Comment: social  . Drug use: Not Currently    Types: Marijuana    Allergies:  Allergies  Allergen Reactions  . Amoxicillin Nausea And Vomiting and Other (See Comments)    Tics & twitches Has patient had a PCN reaction causing immediate rash, facial/tongue/throat swelling, SOB or lightheadedness with hypotension: Yes Has patient had a PCN reaction causing severe rash involving mucus membranes or skin necrosis: No Has patient had a PCN reaction that required hospitalization No Has patient had a PCN reaction occurring within the last 10 years: Yes If all of the above answers are "NO", then may proceed with Cephalosporin use.  Marland Kitchen  Contrast Media [Iodinated Diagnostic Agents] Hives  . Dilaudid [Hydromorphone Hcl] Itching    Medications Prior to Admission  Medication Sig Dispense Refill Last Dose  . acetaminophen (TYLENOL) 325 MG tablet Take 650 mg by mouth every 6 (six) hours as needed for headache.   Not Taking  . Elastic Bandages & Supports (COMFORT FIT MATERNITY SUPP LG) MISC 1 Units by Does not apply route daily. (Patient not taking: Reported on 03/24/2018) 1 each 0 Not Taking  . metoCLOPramide (REGLAN) 10 MG tablet Take 1 tablet (10 mg total) by mouth 3 (three) times daily before meals. (Patient not taking: Reported on 04/20/2018) 90 tablet 2 Not Taking  . ondansetron (ZOFRAN ODT) 4 MG disintegrating tablet Take 1 tablet (4 mg total) by mouth every 6 (six) hours as needed for nausea. (Patient not taking: Reported on  04/20/2018) 20 tablet 0 Not Taking  . pantoprazole (PROTONIX) 20 MG tablet Take 1 tablet (20 mg total) by mouth 2 (two) times daily. (Patient not taking: Reported on 04/07/2018) 60 tablet 6 Not Taking  . prenatal vitamin w/FE, FA (PRENATAL 1 + 1) 27-1 MG TABS tablet Take 1 tablet by mouth daily at 12 noon. (Patient not taking: Reported on 05/20/2018) 30 each 11 Not Taking  . promethazine (PHENERGAN) 25 MG suppository Place 1 suppository (25 mg total) rectally every 6 (six) hours as needed for nausea or vomiting. (Patient not taking: Reported on 05/20/2018) 12 each 0 Not Taking  . promethazine (PHENERGAN) 25 MG tablet Take 1 tablet (25 mg total) by mouth every 6 (six) hours as needed for nausea or vomiting. 30 tablet 1   . valACYclovir (VALTREX) 500 MG tablet Take 1 tablet (500 mg total) by mouth 2 (two) times daily. 60 tablet 2     Review of Systems  HENT: Negative for congestion.   Respiratory: Positive for chest tightness and shortness of breath. Negative for cough.   Gastrointestinal: Positive for abdominal pain.  Genitourinary: Negative for vaginal bleeding and vaginal discharge.   Physical Exam   Blood pressure 129/74, pulse (!) 106, temperature 99 F (37.2 C), temperature source Oral, resp. rate 20, height 5' (1.524 m), weight 96.7 kg, last menstrual period 09/15/2017, SpO2 100 %.  Physical Exam  Nursing note and vitals reviewed. Constitutional: She appears well-developed and well-nourished. No distress.  HENT:  Head: Normocephalic and atraumatic.  Neck: Normal range of motion.  Cardiovascular: Normal rate, regular rhythm and normal heart sounds.  Mild tachy  Respiratory: Effort normal and breath sounds normal. No respiratory distress. She has no wheezes. She has no rales.  GI: Soft. She exhibits no distension. There is no abdominal tenderness.  gravid  EFM: 150 bpm, mod variability, + accels, no decels Toco: irritability  Results for orders placed or performed during the hospital  encounter of 05/25/18 (from the past 24 hour(s))  CBC     Status: Abnormal   Collection Time: 05/25/18 12:58 PM  Result Value Ref Range   WBC 11.2 (H) 4.0 - 10.5 K/uL   RBC 3.65 (L) 3.87 - 5.11 MIL/uL   Hemoglobin 9.8 (L) 12.0 - 15.0 g/dL   HCT 30.1 (L) 36.0 - 46.0 %   MCV 82.5 80.0 - 100.0 fL   MCH 26.8 26.0 - 34.0 pg   MCHC 32.6 30.0 - 36.0 g/dL   RDW 13.9 11.5 - 15.5 %   Platelets 325 150 - 400 K/uL   nRBC 0.0 0.0 - 0.2 %   MAU Course  Procedures  MDM Labs ordered  and reviewed. Low suspicion for PE or cardiac etiology. Sx likely d/t advancing pregnancy and/or anemia. No signs of PTL. Stable for discharge home.   Assessment and Plan   1. [redacted] weeks gestation of pregnancy   2. NST (non-stress test) reactive   3. Shortness of breath due to pregnancy   4. Anemia during pregnancy in third trimester    Discharge home Follow up in OB office as scheduled this week Return precautions Rx Ferrex  Allergies as of 05/25/2018      Reactions   Amoxicillin Nausea And Vomiting, Other (See Comments)   Tics & twitches Has patient had a PCN reaction causing immediate rash, facial/tongue/throat swelling, SOB or lightheadedness with hypotension: Yes Has patient had a PCN reaction causing severe rash involving mucus membranes or skin necrosis: No Has patient had a PCN reaction that required hospitalization No Has patient had a PCN reaction occurring within the last 10 years: Yes If all of the above answers are "NO", then may proceed with Cephalosporin use.   Contrast Media [iodinated Diagnostic Agents] Hives   Dilaudid [hydromorphone Hcl] Itching      Medication List    TAKE these medications   acetaminophen 325 MG tablet Commonly known as:  TYLENOL Take 650 mg by mouth every 6 (six) hours as needed for headache.   Comfort Fit Maternity Supp Lg Misc 1 Units by Does not apply route daily.   iron polysaccharides 150 MG capsule Commonly known as:  NIFEREX Take 1 capsule (150 mg total)  by mouth daily.   metoCLOPramide 10 MG tablet Commonly known as:  REGLAN Take 1 tablet (10 mg total) by mouth 3 (three) times daily before meals.   ondansetron 4 MG disintegrating tablet Commonly known as:  Zofran ODT Take 1 tablet (4 mg total) by mouth every 6 (six) hours as needed for nausea.   pantoprazole 20 MG tablet Commonly known as:  PROTONIX Take 1 tablet (20 mg total) by mouth 2 (two) times daily.   prenatal vitamin w/FE, FA 27-1 MG Tabs tablet Take 1 tablet by mouth daily at 12 noon.   promethazine 25 MG suppository Commonly known as:  PHENERGAN Place 1 suppository (25 mg total) rectally every 6 (six) hours as needed for nausea or vomiting.   promethazine 25 MG tablet Commonly known as:  PHENERGAN Take 1 tablet (25 mg total) by mouth every 6 (six) hours as needed for nausea or vomiting.   valACYclovir 500 MG tablet Commonly known as:  Valtrex Take 1 tablet (500 mg total) by mouth 2 (two) times daily.      Julianne Handler, CNM 05/25/2018, 12:51 PM

## 2018-05-27 ENCOUNTER — Other Ambulatory Visit: Payer: Self-pay

## 2018-05-27 ENCOUNTER — Ambulatory Visit (INDEPENDENT_AMBULATORY_CARE_PROVIDER_SITE_OTHER): Payer: Medicaid Other | Admitting: Nurse Practitioner

## 2018-05-27 ENCOUNTER — Other Ambulatory Visit (HOSPITAL_COMMUNITY)
Admission: RE | Admit: 2018-05-27 | Discharge: 2018-05-27 | Disposition: A | Discharge status: Home / Self Care | Payer: Medicaid Other | Source: Ambulatory Visit | Attending: Nurse Practitioner | Admitting: Nurse Practitioner

## 2018-05-27 VITALS — BP 110/59 | HR 116 | Wt 213.0 lb

## 2018-05-27 DIAGNOSIS — Z3483 Encounter for supervision of other normal pregnancy, third trimester: Secondary | ICD-10-CM

## 2018-05-27 DIAGNOSIS — Z3480 Encounter for supervision of other normal pregnancy, unspecified trimester: Secondary | ICD-10-CM

## 2018-05-27 DIAGNOSIS — Z3A36 36 weeks gestation of pregnancy: Secondary | ICD-10-CM

## 2018-05-27 NOTE — Patient Instructions (Signed)

## 2018-05-27 NOTE — Progress Notes (Signed)
    Subjective:  Emma Chambers is a 28 y.o. [phone] at [redacted]w[redacted]d being seen today for ongoing prenatal care.  She is currently monitored for the following issues for this low-risk pregnancy and has H. pylori infection; Generalized headaches; Pica in adults; Ruptured ectopic pregnancy; Supervision of other normal pregnancy, antepartum; History of gestational hypertension; Obesity in pregnancy; Hyperemesis gravidarum, antepartum; Mild tetrahydrocannabinol (THC) abuse; and HSV (herpes simplex virus) infection on their problem list.  Patient reports still has some vomiting but knows it will over when the baby comes..  Contractions: Irritability. Vag. Bleeding: None.  Movement: Present. Denies leaking of fluid.   Was seen in MAU 2 days ago.  Chart reviewed.  Reports her symptoms are the same.  Baby is moving well.  The following portions of the patient's history were reviewed and updated as appropriate: allergies, current medications, past family history, past medical history, past social history, past surgical history and problem list. Problem list updated.  Objective:   Vitals:   05/27/18 1328  BP: (!) 110/59  Pulse: (!) 116  Weight: 213 lb (96.6 kg)    Fetal Status: Fetal Heart Rate (bpm): 154 Fundal Height: 37 cm Movement: Present     General:  Alert, oriented and cooperative. Patient is in no acute distress.  Skin: Skin is warm and dry. No rash noted.   Cardiovascular: Normal heart rate noted; mild tachycardia consistent with normal late pregnancy, no murmurs heard  Respiratory: Normal respiratory effort, no problems with respiration noted, lung sounds clear bilaterally  Abdomen: Soft, gravid, appropriate for gestational age. Pain/Pressure: Present     Pelvic:  Cervical exam deferred      GC/Chlam and GBS swabs done  Extremities: Normal range of motion.  Edema: Trace  Mental Status: Normal mood and affect. Normal behavior. Normal judgment and thought content.   Urinalysis:       Assessment and Plan:  Pregnancy: [id number] at [redacted]w[redacted]d  1. Supervision of other normal pregnancy, antepartum Plans PP BTL - Strep Gp B Culture+Rflx - GC/Chlamydia probe amp ([place])not at [facility]  Preterm labor symptoms and general obstetric precautions including but not limited to vaginal bleeding, contractions, leaking of fluid and fetal movement were reviewed in detail with the patient. Please refer to After Visit Summary for other counseling recommendations.  Return in about 1 week (around 06/03/2018).  Earlie Server, RN, MSN, NP-BC Nurse Practitioner, [facility] for [employer], [place] [facility] 05/27/2018 1:51 PM

## 2018-05-28 LAB — GC/CHLAMYDIA PROBE AMP (~~LOC~~) NOT AT ARMC
Chlamydia: NEGATIVE
Neisseria Gonorrhea: NEGATIVE

## 2018-05-31 LAB — STREP GP B CULTURE+RFLX: Strep Gp B Culture+Rflx: NEGATIVE

## 2018-06-01 ENCOUNTER — Ambulatory Visit (HOSPITAL_COMMUNITY)
Admission: RE | Admit: 2018-06-01 | Discharge: 2018-06-01 | Disposition: A | Discharge status: Home / Self Care | Payer: Medicaid Other | Source: Ambulatory Visit | Attending: Obstetrics and Gynecology | Admitting: Obstetrics and Gynecology

## 2018-06-01 ENCOUNTER — Other Ambulatory Visit: Payer: Self-pay

## 2018-06-01 ENCOUNTER — Ambulatory Visit (HOSPITAL_COMMUNITY): Payer: Medicaid Other | Admitting: *Deleted

## 2018-06-01 ENCOUNTER — Encounter (HOSPITAL_COMMUNITY): Payer: Self-pay

## 2018-06-01 VITALS — BP 116/66 | HR 96 | Temp 99.3°F | Wt 215.0 lb

## 2018-06-01 DIAGNOSIS — O09293 Supervision of pregnancy with other poor reproductive or obstetric history, third trimester: Secondary | ICD-10-CM | POA: Diagnosis not present

## 2018-06-01 DIAGNOSIS — O09213 Supervision of pregnancy with history of pre-term labor, third trimester: Secondary | ICD-10-CM

## 2018-06-01 DIAGNOSIS — E669 Obesity, unspecified: Secondary | ICD-10-CM | POA: Diagnosis not present

## 2018-06-01 DIAGNOSIS — O0990 Supervision of high risk pregnancy, unspecified, unspecified trimester: Secondary | ICD-10-CM

## 2018-06-01 DIAGNOSIS — O99213 Obesity complicating pregnancy, third trimester: Secondary | ICD-10-CM

## 2018-06-01 DIAGNOSIS — Z362 Encounter for other antenatal screening follow-up: Secondary | ICD-10-CM | POA: Diagnosis not present

## 2018-06-01 DIAGNOSIS — Z3A37 37 weeks gestation of pregnancy: Secondary | ICD-10-CM

## 2018-06-03 ENCOUNTER — Ambulatory Visit (INDEPENDENT_AMBULATORY_CARE_PROVIDER_SITE_OTHER): Payer: Medicaid Other | Admitting: Medical

## 2018-06-03 ENCOUNTER — Telehealth: Payer: Self-pay

## 2018-06-03 ENCOUNTER — Other Ambulatory Visit: Payer: Self-pay

## 2018-06-03 ENCOUNTER — Encounter: Payer: Self-pay | Admitting: Medical

## 2018-06-03 VITALS — BP 118/70 | HR 96 | Temp 98.6°F | Wt 214.6 lb

## 2018-06-03 DIAGNOSIS — O98513 Other viral diseases complicating pregnancy, third trimester: Secondary | ICD-10-CM

## 2018-06-03 DIAGNOSIS — Z8759 Personal history of other complications of pregnancy, childbirth and the puerperium: Secondary | ICD-10-CM

## 2018-06-03 DIAGNOSIS — B009 Herpesviral infection, unspecified: Secondary | ICD-10-CM

## 2018-06-03 DIAGNOSIS — Z3A37 37 weeks gestation of pregnancy: Secondary | ICD-10-CM

## 2018-06-03 DIAGNOSIS — Z3480 Encounter for supervision of other normal pregnancy, unspecified trimester: Secondary | ICD-10-CM

## 2018-06-03 NOTE — Patient Instructions (Signed)
Fetal Movement Counts Patient Name: ________________________________________________ Patient Due Date: ____________________ What is a fetal movement count?  A fetal movement count is the number of times that you feel your baby move during a certain amount of time. This may also be called a fetal kick count. A fetal movement count is recommended for every pregnant woman. You may be asked to start counting fetal movements as early as week 28 of your pregnancy. Pay attention to when your baby is most active. You may notice your baby's sleep and wake cycles. You may also notice things that make your baby move more. You should do a fetal movement count:  When your baby is normally most active.  At the same time each day. A good time to count movements is while you are resting, after having something to eat and drink. How do I count fetal movements? 1. Find a quiet, comfortable area. Sit, or lie down on your side. 2. Write down the date, the start time and stop time, and the number of movements that you felt between those two times. Take this information with you to your health care visits. 3. For 2 hours, count kicks, flutters, swishes, rolls, and jabs. You should feel at least 10 movements during 2 hours. 4. You may stop counting after you have felt 10 movements. 5. If you do not feel 10 movements in 2 hours, have something to eat and drink. Then, keep resting and counting for 1 hour. If you feel at least 4 movements during that hour, you may stop counting. Contact a health care provider if:  You feel fewer than 4 movements in 2 hours.  Your baby is not moving like he or she usually does. Date: ____________ Start time: ____________ Stop time: ____________ Movements: ____________ Date: ____________ Start time: ____________ Stop time: ____________ Movements: ____________ Date: ____________ Start time: ____________ Stop time: ____________ Movements: ____________ Date: ____________ Start time:  ____________ Stop time: ____________ Movements: ____________ Date: ____________ Start time: ____________ Stop time: ____________ Movements: ____________ Date: ____________ Start time: ____________ Stop time: ____________ Movements: ____________ Date: ____________ Start time: ____________ Stop time: ____________ Movements: ____________ Date: ____________ Start time: ____________ Stop time: ____________ Movements: ____________ Date: ____________ Start time: ____________ Stop time: ____________ Movements: ____________ This information is not intended to replace advice given to you by your health care provider. Make sure you discuss any questions you have with your health care provider. Document Released: 04/03/2006 Document Revised: 11/01/2015 Document Reviewed: 04/13/2015 Elsevier Interactive Patient Education  2019 [employer] Braxton Hicks Contractions Contractions of the uterus can occur throughout pregnancy, but they are not always a sign that you are in labor. You may have practice contractions called Braxton Hicks contractions. These false labor contractions are sometimes confused with true labor. What are Braxton Hicks contractions? Braxton Hicks contractions are tightening movements that occur in the muscles of the uterus before labor. Unlike true labor contractions, these contractions do not result in opening (dilation) and thinning of the cervix. Toward the end of pregnancy (32-34 weeks), Braxton Hicks contractions can happen more often and may become stronger. These contractions are sometimes difficult to tell apart from true labor because they can be very uncomfortable. You should not feel embarrassed if you go to the hospital with false labor. Sometimes, the only way to tell if you are in true labor is for your health care provider to look for changes in the cervix. The health care provider will do a physical exam and may monitor your contractions. If   you are not in true labor, the exam  should show that your cervix is not dilating and your water has not broken. If there are no other health problems associated with your pregnancy, it is completely safe for you to be sent home with false labor. You may continue to have Braxton Hicks contractions until you go into true labor. How to tell the difference between true labor and false labor True labor  Contractions last 30-70 seconds.  Contractions become very regular.  Discomfort is usually felt in the top of the uterus, and it spreads to the lower abdomen and low back.  Contractions do not go away with walking.  Contractions usually become more intense and increase in frequency.  The cervix dilates and gets thinner. False labor  Contractions are usually shorter and not as strong as true labor contractions.  Contractions are usually irregular.  Contractions are often felt in the front of the lower abdomen and in the groin.  Contractions may go away when you walk around or change positions while lying down.  Contractions get weaker and are shorter-lasting as time goes on.  The cervix usually does not dilate or become thin. Follow these instructions at home:   Take over-the-counter and prescription medicines only as told by your health care provider.  Keep up with your usual exercises and follow other instructions from your health care provider.  Eat and drink lightly if you think you are going into labor.  If Braxton Hicks contractions are making you uncomfortable: ? Change your position from lying down or resting to walking, or change from walking to resting. ? Sit and rest in a tub of warm water. ? Drink enough fluid to keep your urine pale yellow. Dehydration may cause these contractions. ? Do slow and deep breathing several times an hour.  Keep all follow-up prenatal visits as told by your health care provider. This is important. Contact a health care provider if:  You have a fever.  You have continuous  pain in your abdomen. Get help right away if:  Your contractions become stronger, more regular, and closer together.  You have fluid leaking or gushing from your vagina.  You pass blood-tinged mucus (bloody show).  You have bleeding from your vagina.  You have low back pain that you never had before.  You feel your baby's head pushing down and causing pelvic pressure.  Your baby is not moving inside you as much as it used to. Summary  Contractions that occur before labor are called Braxton Hicks contractions, false labor, or practice contractions.  Braxton Hicks contractions are usually shorter, weaker, farther apart, and less regular than true labor contractions. True labor contractions usually become progressively stronger and regular, and they become more frequent.  Manage discomfort from Braxton Hicks contractions by changing position, resting in a warm bath, drinking plenty of water, or practicing deep breathing. This information is not intended to replace advice given to you by your health care provider. Make sure you discuss any questions you have with your health care provider. Document Released: 07/18/2016 Document Revised: 12/17/2016 Document Reviewed: 07/18/2016 Elsevier Interactive Patient Education  2019 Elsevier Inc.  

## 2018-06-03 NOTE — Progress Notes (Signed)
   PRENATAL VISIT NOTE  Subjective:  Emma Chambers is a 28 y.o. [phone] at [redacted]w[redacted]d being seen today for ongoing prenatal care.  She is currently monitored for the following issues for this high-risk pregnancy and has H. pylori infection; Generalized headaches; Pica in adults; Ruptured ectopic pregnancy; Supervision of other normal pregnancy, antepartum; History of gestational hypertension; Obesity in pregnancy; Hyperemesis gravidarum, antepartum; Mild tetrahydrocannabinol (THC) abuse; and HSV (herpes simplex virus) infection on their problem list.  Patient reports mild pelvic pressure and mild LE edema.  Contractions: Irritability. Vag. Bleeding: None.  Movement: Present. Denies leaking of fluid.   The following portions of the patient's history were reviewed and updated as appropriate: allergies, current medications, past family history, past medical history, past social history, past surgical history and problem list.   Objective:   Vitals:   06/03/18 1416  BP: 118/70  Pulse: 96  Temp: 98.6 F (37 C)  Weight: 214 lb 9.6 oz (97.3 kg)    Fetal Status: Fetal Heart Rate (bpm): 159 Fundal Height: 39 cm Movement: Present     General:  Alert, oriented and cooperative. Patient is in no acute distress.  Skin: Skin is warm and dry. No rash noted.   Cardiovascular: Normal heart rate noted  Respiratory: Normal respiratory effort, no problems with respiration noted  Abdomen: Soft, gravid, appropriate for gestational age.  Pain/Pressure: Present     Pelvic: Cervical exam deferred        Extremities: Normal range of motion.  Edema: Trace  Mental Status: Normal mood and affect. Normal behavior. Normal judgment and thought content.   Assessment and Plan:  Pregnancy: [id number] at [redacted]w[redacted]d 1. Supervision of other normal pregnancy, antepartum - Encouraged patient to call ahead if experiencing respiratory symptoms with fever so that she can be triaged and tested/directed appropriately   2. HSV (herpes  simplex virus) infection - Taking Valtrex  3. History of gestational hypertension - Normotensive today - Warning symptoms reviewed   Term labor symptoms and general obstetric precautions including but not limited to vaginal bleeding, contractions, leaking of fluid and fetal movement were reviewed in detail with the patient. Please refer to After Visit Summary for other counseling recommendations.   Return in about 1 week (around 06/10/2018) for LOB.  Future Appointments  Date Time Provider [place]  06/10/2018  3:55 PM Virginia Rochester, NP [facility]  06/18/2018 11:15 AM Rasch, Artist Pais, NP [facility]  06/24/2018 10:55 AM Luvenia Redden, PA-C [facility]    Kerry Hough, PA-C

## 2018-06-03 NOTE — Telephone Encounter (Signed)
Called pt to make sure she keeping appt of would like to reschedule. Pt states will be here, gave current check in process, Pt verbalized understanding.

## 2018-06-08 ENCOUNTER — Inpatient Hospital Stay (HOSPITAL_BASED_OUTPATIENT_CLINIC_OR_DEPARTMENT_OTHER): Payer: Medicaid Other

## 2018-06-08 ENCOUNTER — Inpatient Hospital Stay (HOSPITAL_COMMUNITY)
Admission: AD | Admit: 2018-06-08 | Discharge: 2018-06-08 | Disposition: A | Discharge status: Home / Self Care | Payer: Medicaid Other | Attending: Obstetrics and Gynecology | Admitting: Obstetrics and Gynecology

## 2018-06-08 ENCOUNTER — Encounter (HOSPITAL_COMMUNITY): Payer: Self-pay | Admitting: *Deleted

## 2018-06-08 ENCOUNTER — Other Ambulatory Visit: Payer: Self-pay

## 2018-06-08 DIAGNOSIS — Z3A38 38 weeks gestation of pregnancy: Secondary | ICD-10-CM

## 2018-06-08 DIAGNOSIS — W108XXA Fall (on) (from) other stairs and steps, initial encounter: Secondary | ICD-10-CM

## 2018-06-08 DIAGNOSIS — Z87891 Personal history of nicotine dependence: Secondary | ICD-10-CM | POA: Diagnosis not present

## 2018-06-08 DIAGNOSIS — Z3689 Encounter for other specified antenatal screening: Secondary | ICD-10-CM | POA: Diagnosis present

## 2018-06-08 DIAGNOSIS — Z885 Allergy status to narcotic agent status: Secondary | ICD-10-CM | POA: Diagnosis not present

## 2018-06-08 DIAGNOSIS — O99213 Obesity complicating pregnancy, third trimester: Secondary | ICD-10-CM | POA: Diagnosis not present

## 2018-06-08 DIAGNOSIS — Z9049 Acquired absence of other specified parts of digestive tract: Secondary | ICD-10-CM | POA: Diagnosis not present

## 2018-06-08 DIAGNOSIS — Z833 Family history of diabetes mellitus: Secondary | ICD-10-CM | POA: Diagnosis not present

## 2018-06-08 DIAGNOSIS — W109XXA Fall (on) (from) unspecified stairs and steps, initial encounter: Secondary | ICD-10-CM | POA: Diagnosis not present

## 2018-06-08 DIAGNOSIS — Z880 Allergy status to penicillin: Secondary | ICD-10-CM | POA: Diagnosis not present

## 2018-06-08 DIAGNOSIS — O99013 Anemia complicating pregnancy, third trimester: Secondary | ICD-10-CM | POA: Diagnosis not present

## 2018-06-08 DIAGNOSIS — O9989 Other specified diseases and conditions complicating pregnancy, childbirth and the puerperium: Secondary | ICD-10-CM | POA: Diagnosis not present

## 2018-06-08 DIAGNOSIS — E669 Obesity, unspecified: Secondary | ICD-10-CM | POA: Diagnosis not present

## 2018-06-08 DIAGNOSIS — Z91041 Radiographic dye allergy status: Secondary | ICD-10-CM | POA: Insufficient documentation

## 2018-06-08 DIAGNOSIS — Z79899 Other long term (current) drug therapy: Secondary | ICD-10-CM | POA: Insufficient documentation

## 2018-06-08 DIAGNOSIS — O99210 Obesity complicating pregnancy, unspecified trimester: Secondary | ICD-10-CM

## 2018-06-08 DIAGNOSIS — O9A213 Injury, poisoning and certain other consequences of external causes complicating pregnancy, third trimester: Secondary | ICD-10-CM

## 2018-06-08 DIAGNOSIS — Z3480 Encounter for supervision of other normal pregnancy, unspecified trimester: Secondary | ICD-10-CM

## 2018-06-08 DIAGNOSIS — M545 Low back pain: Secondary | ICD-10-CM | POA: Diagnosis not present

## 2018-06-08 MED ORDER — CYCLOBENZAPRINE HCL 10 MG PO TABS
10.0000 mg | ORAL_TABLET | Freq: Once | ORAL | Status: AC
  Administered 2018-06-08: 10 mg via ORAL
  Filled 2018-06-08: qty 1

## 2018-06-08 NOTE — MAU Note (Signed)
Pt states that she fell this morning and hit her right side while carrying clothes down the steps

## 2018-06-08 NOTE — Discharge Instructions (Signed)
Signs and Symptoms of Labor Labor is your body's natural process of moving your baby, placenta, and umbilical cord out of your uterus. The process of labor usually starts when your baby is full-term, between 37 and 40 weeks of pregnancy. How will I know when I am close to going into labor? As your body prepares for labor and the birth of your baby, you may notice the following symptoms in the weeks and days before true labor starts:  Having a strong desire to get your home ready to receive your new baby. This is called nesting. Nesting may be a sign that labor is approaching, and it may occur several weeks before birth. Nesting may involve cleaning and organizing your home.  Passing a small amount of thick, bloody mucus out of your vagina (normal bloody show or losing your mucus plug). This may happen more than a week before labor begins, or it might occur right before labor begins as the opening of the cervix starts to widen (dilate). For some women, the entire mucus plug passes at once. For others, smaller portions of the mucus plug may gradually pass over several days.  Your baby moving (dropping) lower in your pelvis to get into position for birth (lightening). When this happens, you may feel more pressure on your bladder and pelvic bone and less pressure on your ribs. This may make it easier to breathe. It may also cause you to need to urinate more often and have problems with bowel movements.  Having "practice contractions" (Braxton Hicks contractions) that occur at irregular (unevenly spaced) intervals that are more than 10 minutes apart. This is also called false labor. False labor contractions are common after exercise or sexual activity, and they will stop if you change position, rest, or drink fluids. These contractions are usually mild and do not get stronger over time. They may feel like: ? A backache or back pain. ? Mild cramps, similar to menstrual cramps. ? Tightening or pressure in  your abdomen. Other early symptoms that labor may be starting soon include:  Nausea or loss of appetite.  Diarrhea.  Having a sudden burst of energy, or feeling very tired.  Mood changes.  Having trouble sleeping. How will I know when labor has begun? Signs that true labor has begun may include:  Having contractions that come at regular (evenly spaced) intervals and increase in intensity. This may feel like more intense tightening or pressure in your abdomen that moves to your back. ? Contractions may also feel like rhythmic pain in your upper thighs or back that comes and goes at regular intervals. ? For first-time mothers, this change in intensity of contractions often occurs at a more gradual pace. ? Women who have given birth before may notice a more rapid progression of contraction changes.  Having a feeling of pressure in the vaginal area.  Your water breaking (rupture of membranes). This is when the sac of fluid that surrounds your baby breaks. When this happens, you will notice fluid leaking from your vagina. This may be clear or blood-tinged. Labor usually starts within 24 hours of your water breaking, but it may take longer to begin. ? Some women notice this as a gush of fluid. ? Others notice that their underwear repeatedly becomes damp. Follow these instructions at home:   When labor starts, or if your water breaks, call your health care provider or nurse care line. Based on your situation, they will determine when you should go in for an   exam.  When you are in early labor, you may be able to rest and manage symptoms at home. Some strategies to try at home include: ? Breathing and relaxation techniques. ? Taking a warm bath or shower. ? Listening to music. ? Using a heating pad on the lower back for pain. If you are directed to use heat:  Place a towel between your skin and the heat source.  Leave the heat on for 20-30 minutes.  Remove the heat if your skin turns  bright red. This is especially important if you are unable to feel pain, heat, or cold. You may have a greater risk of getting burned. Get help right away if:  You have painful, regular contractions that are 5 minutes apart or less.  Labor starts before you are [redacted] weeks along in your pregnancy.  You have a fever.  You have a headache that does not go away.  You have bright red blood coming from your vagina.  You do not feel your baby moving.  You have a sudden onset of: ? Severe headache with vision problems. ? Nausea, vomiting, or diarrhea. ? Chest pain or shortness of breath. These symptoms may be an emergency. If your health care provider recommends that you go to the hospital or birth center where you plan to deliver, do not drive yourself. Have someone else drive you, or call emergency services (911 in the [place] Summary  Labor is your body's natural process of moving your baby, placenta, and umbilical cord out of your uterus.  The process of labor usually starts when your baby is full-term, between 38 and 40 weeks of pregnancy.  When labor starts, or if your water breaks, call your health care provider or nurse care line. Based on your situation, they will determine when you should go in for an exam. This information is not intended to replace advice given to you by your health care provider. Make sure you discuss any questions you have with your health care provider. Document Released: 08/09/2016 Document Revised: 08/09/2016 Document Reviewed: 08/09/2016 Elsevier Interactive Patient Education  2019 [employer]. Placental Abruption  Placental abruption is a condition in which the placenta partly or completely separates from the uterus before the baby is born. The placenta is the organ that nourishes the unborn baby (fetus). The baby gets his or her blood supply and nutrients through the placenta. It is the babys life support system. The placenta is attached to the inside of  the uterus until after the baby is born. Placental abruption is rare, but it can happen any time after 20 weeks of pregnancy. A small separation may not cause problems, but a large separation may be dangerous for you and your baby. A large separation is usually an emergency. It requires treatment right away. What are the causes? In most cases, the cause of this condition is not known. What increases the risk? This condition is more likely to develop in women who:  Have experienced a recent trauma such as a fall, an abdominal injury, or a car accident.  Have a previous placental abruption.  Have high blood pressure (hypertension).  Smoke cigarettes, use alcohol, or use illegal drugs such as cocaine.  Have blood clotting problems.  Experience preterm premature rupture of membranes (PPROM).  Have multiples (twins, triplets, or more).  Have had children before.  Are 62 years of age or older. What are the signs or symptoms? Symptoms of this condition can vary from mild to severe. A  small placental abruption may not cause symptoms, or it may cause mild symptoms, which may include:  Mild abdominal pain or lower back pain.  Slight vaginal bleeding. A severe placental abruption will cause symptoms. The symptoms will depend on the size of the separation and the stage of pregnancy. They may include:  Abdominal pain or lower back pain.  Vaginal bleeding.  Tender and hard uterus.  Severe abdominal pain with tenderness.  Continual contractions of your uterus.  Weakness and light-headedness. How is this diagnosed? This condition may be diagnosed based on:  Your symptoms.  A physical exam.  Ultrasound.  Blood work. This will be done to make sure that there are enough healthy red blood cells and that there are no clotting problems or signs of too much blood loss. How is this treated? Treatment for placental abruption depends on the severity of the condition. For mild cases,  treatment may involve monitoring your condition and managing your symptoms. This may involve:  Bed rest and close observation. For more severe cases, emergency treatment is needed. This may involve:  Staying in the hospital until you and your baby are stabilized.  Cesarean delivery of your baby.  A blood transfusion or other fluids given through an IV tube.  Other treatments, depending on: ? The amount of bleeding you have. ? Whether you or your baby are in distress. ? The stage of your pregnancy. ? The maturity of the baby. Follow these instructions at home:  Take over-the-counter and prescription medicines only as told by your health care provider. Do not take any medicines that your health care provider has not approved.  Arrange for help at home before and after you deliver your baby, especially if you had a cesarean delivery or if you lost a lot of blood.  Get plenty of rest and sleep.  Do not use illegal drugs.  Do not drink alcohol.  Do not have sexual intercourse until your health care provider says it is okay.  Do not use tampons or douche unless your health care provider says it is okay.  Do not use any products that contain nicotine or tobacco, such as cigarettes and e-cigarettes. If you need help quitting, ask your health care provider. Get help right away if:  You have vaginal bleeding or spotting.  You have any type of trauma, such as a fall, abdominal trauma, or a car accident.  You have abdominal pain.  You have continuous uterine contractions.  You have a hard, tender uterus.  You do not feel the baby move, or the baby moves very little. This information is not intended to replace advice given to you by your health care provider. Make sure you discuss any questions you have with your health care provider. Document Released: 03/04/2005 Document Revised: 11/02/2015 Document Reviewed: 09/24/2015 Elsevier Interactive Patient Education  2019 [employer].

## 2018-06-08 NOTE — MAU Provider Note (Signed)
History     CSN: [id number]  Arrival date and time: 06/08/18 1021   First Provider Initiated Contact with Patient 06/08/18 1132      Chief Complaint  Patient presents with  . Contractions   Emma Chambers is a 28 y.o. [id number] at [redacted]w[redacted]d who presents to MAU for ctx q5-62min. However, during triage, pt also reports that she fell down the stairs while carrying laundry around 0900 today. Pt reports she fell down about 2 stairs and reports landing on her right side. Pt unsure if she hit her abdomen, but reports when she pushed herself up from the floor she was laying on her right side and her abdomen was touching the floor. Pt reports ctx got worse after she fell. Pt also reporting right flank pain and pain in the lower back. Pt took "2 extra strength pills" of Tylenol at 0920. Pt currently rates pain as 10/10. Pt reports pain has gotten worse since 0900. Pt also reports mild pain on right side of abdomen.  Pt denies VB, LOF, decreased FM, vaginal discharge/odor/itching. Pt denies N/V, constipation, diarrhea, or urinary problems. Pt denies fever, chills, fatigue, sweating or changes in appetite. Pt denies SOB or chest pain. Pt denies dizziness, HA, light-headedness, weakness.  Problems this pregnancy include: hx of HSV. Allergies? AMOX, contrast dye, Dilaudid Current medications/supplements? Valtrex, PNVs Prenatal care provider? [facility], next appt 06/10/2018   OB History    Gravida  4   Para  2   Term  1   Preterm  1   AB  1   Living  2     SAB  0   TAB  0   Ectopic  1   Multiple  0   Live Births  2        Obstetric Comments  2014: IOL at 55 weeks 2/2 gallstones 2013: GHTN 2016: ectopic pregnancy: Had methotrexate and then had surgery         Past Medical History:  Diagnosis Date  . Anemia   . Gallstones   . H. pylori infection   . Headache(784.0)    chronic  . Kidney stones   . Ovarian cyst   . Pregnancy induced hypertension   . UTI (lower  urinary tract infection)     Past Surgical History:  Procedure Laterality Date  . APPENDECTOMY    . CHOLECYSTECTOMY N/A 07/27/2012   Procedure: LAPAROSCOPIC CHOLECYSTECTOMY WITH INTRAOPERATIVE CHOLANGIOGRAM;  Surgeon: Madilyn Hook, DO;  Location: [facility];  Service: General;  Laterality: N/A;  . DIAGNOSTIC LAPAROSCOPY WITH REMOVAL OF ECTOPIC PREGNANCY N/A 12/27/2014   Procedure: DIAGNOSTIC LAPAROSCOPY, Right Salpingectomy with removal ECTOPIC PREGNANCY;  Surgeon: [name], MD;  Location: [facility];  Service: Gynecology;  Laterality: N/A;  . LAPAROSCOPIC APPENDECTOMY N/A 12/09/2012   Procedure: APPENDECTOMY LAPAROSCOPIC;  Surgeon: Gwenyth Ober, MD;  Location: [facility];  Service: General;  Laterality: N/A;    Family History  Problem Relation Age of Onset  . Asthma Mother   . Hypertension Mother   . Parkinsonism Mother   . Asthma Brother   . Diabetes Maternal Grandmother   . Hypertension Maternal Grandmother   . Stroke Maternal Grandmother   . Heart disease Maternal Grandmother   . Lupus Maternal Aunt   . Heart disease Maternal Grandfather   . Anesthesia problems Neg Hx   . Malignant hyperthermia Neg Hx   . Pseudochol deficiency Neg Hx   . Hypotension Neg Hx     Social History  Tobacco Use  . Smoking status: Former Smoker    Types: Cigarettes  . Smokeless tobacco: Never Used  . Tobacco comment: 2014  Substance Use Topics  . Alcohol use: Never    Frequency: Never    Comment: social  . Drug use: Not Currently    Types: Marijuana    Allergies:  Allergies  Allergen Reactions  . Amoxicillin Nausea And Vomiting and Other (See Comments)    Tics & twitches Has patient had a PCN reaction causing immediate rash, facial/tongue/throat swelling, SOB or lightheadedness with hypotension: Yes Has patient had a PCN reaction causing severe rash involving mucus membranes or skin necrosis: No Has patient had a PCN reaction that required hospitalization No Has patient had a PCN reaction  occurring within the last 10 years: Yes If all of the above answers are "NO", then may proceed with Cephalosporin use.  . Contrast Media [Iodinated Diagnostic Agents] Hives  . Dilaudid [Hydromorphone Hcl] Itching    Medications Prior to Admission  Medication Sig Dispense Refill Last Dose  . acetaminophen (TYLENOL) 325 MG tablet Take 650 mg by mouth every 6 (six) hours as needed for headache.   Taking  . Elastic Bandages & Supports (COMFORT FIT MATERNITY SUPP LG) MISC 1 Units by Does not apply route daily. (Patient not taking: Reported on 03/24/2018) 1 each 0 Not Taking  . iron polysaccharides (NIFEREX) 150 MG capsule Take 1 capsule (150 mg total) by mouth daily. 30 capsule 1 Taking  . metoCLOPramide (REGLAN) 10 MG tablet Take 1 tablet (10 mg total) by mouth 3 (three) times daily before meals. (Patient not taking: Reported on 04/20/2018) 90 tablet 2 Not Taking  . ondansetron (ZOFRAN ODT) 4 MG disintegrating tablet Take 1 tablet (4 mg total) by mouth every 6 (six) hours as needed for nausea. (Patient not taking: Reported on 04/20/2018) 20 tablet 0 Not Taking  . pantoprazole (PROTONIX) 20 MG tablet Take 1 tablet (20 mg total) by mouth 2 (two) times daily. (Patient not taking: Reported on 04/07/2018) 60 tablet 6 Not Taking  . prenatal vitamin w/FE, FA (PRENATAL 1 + 1) 27-1 MG TABS tablet Take 1 tablet by mouth daily at 12 noon. (Patient not taking: Reported on 05/20/2018) 30 each 11 Not Taking  . promethazine (PHENERGAN) 25 MG suppository Place 1 suppository (25 mg total) rectally every 6 (six) hours as needed for nausea or vomiting. (Patient not taking: Reported on 05/20/2018) 12 each 0 Not Taking  . promethazine (PHENERGAN) 25 MG tablet Take 1 tablet (25 mg total) by mouth every 6 (six) hours as needed for nausea or vomiting. 30 tablet 1 Taking  . valACYclovir (VALTREX) 500 MG tablet Take 1 tablet (500 mg total) by mouth 2 (two) times daily. 60 tablet 2 Taking    Review of Systems  Constitutional: Negative  for appetite change, chills, diaphoresis, fatigue and fever.  Respiratory: Negative for shortness of breath.   Cardiovascular: Negative for chest pain.  Gastrointestinal: Positive for abdominal pain. Negative for constipation, diarrhea, nausea and vomiting.  Genitourinary: Positive for flank pain. Negative for difficulty urinating, dysuria, frequency, pelvic pain, urgency, vaginal bleeding and vaginal discharge.  Musculoskeletal: Positive for back pain.  Neurological: Negative for dizziness, weakness, light-headedness and headaches.   Physical Exam   Blood pressure 113/65, pulse (!) 110, temperature 98.6 F (37 C), temperature source Oral, resp. rate 20, weight 97.1 kg, last menstrual period 09/15/2017, SpO2 100 %.  Patient Vitals for the past 24 hrs:  BP Temp Temp src Pulse Resp  SpO2 Weight  06/08/18 1036 113/65 98.6 F (37 C) Oral (!) 110 20 100 % 97.1 kg    Physical Exam  Constitutional: She is oriented to person, place, and time. She appears well-developed and well-nourished. She appears distressed (pt moving on the bed d/t pain, but is laying on her right side that she reports is experiencing pain).  HENT:  Head: Normocephalic.  Respiratory: Effort normal.  GI: There is abdominal tenderness in the right upper quadrant and right lower quadrant.  Tenderness along right flank on palpation.  Neurological: She is alert and oriented to person, place, and time.  Skin: Skin is warm and dry. She is not diaphoretic.  Psychiatric: She has a normal mood and affect. Her behavior is normal.   No results found for this or any previous visit (from the past 24 hour(s)).  [place] Mfm Ob Follow Up  Result Date: 06/02/2018 ----------------------------------------------------------------------  OBSTETRICS REPORT                        (Signed Final 06/02/2018 11:06 am) ---------------------------------------------------------------------- Patient Info  ID #:       [id number]                           D.O.B.:  05-10-1990 (27 yrs)  Name:       Emma Chambers               Visit Date: 06/01/2018 09:46 am ---------------------------------------------------------------------- Performed By  Performed By:     Rodrigo Ran BS      Ref. Address:      [address]                    [place] [address]                                                              [place],                                                              [place] [address]  Attending:        Sander Nephew      [facility]                    MD                                        [facility]  Referred By:      Tresea Mall CNM ---------------------------------------------------------------------- Orders   #  Description                          Code         Ordered By  1  [place] MFM OB FOLLOW UP                  [id number]     [facility]VI [facility]  ----------------------------------------------------------------------   #  Order #                    Accession #                 Episode #   1  [id number]                  [id number]                  [id number]  ---------------------------------------------------------------------- Indications   Encounter for other antenatal screening        Z36.2   follow-up   Obesity complicating pregnancy, third          O99.213   trimester   Poor obstetric history: Previous gestational   O09.299   HTN   Poor obstetric history (prior pre-term         O09.219   delivery)-36w 4d   [redacted] weeks gestation of pregnancy                Z3A.37  ---------------------------------------------------------------------- Vital Signs                                                 Height:        5'2" ---------------------------------------------------------------------- Fetal Evaluation  Num Of Fetuses:          1  Fetal Heart Rate(bpm):   158  Cardiac Activity:        Observed  Presentation:            Cephalic  Placenta:                Anterior  P. Cord Insertion:       Previously Visualized   Amniotic Fluid  AFI FV:      Within normal limits  AFI Sum(cm)     %Tile       Largest Pocket(cm)  17.41           66          4.78  RUQ(cm)       RLQ(cm)       LUQ(cm)        LLQ(cm)  4.64          4.78          3.33           4.66 ---------------------------------------------------------------------- Biometry  BPD:      89.4  mm     G. Age:  36w 1d         42  %    CI:        76.26   %    70 - 86                                                          FL/HC:       19.8  %    20.8 - 22.6  HC:      324.4  mm     G. Age:  36w 5d         20  %    HC/AC:       1.02       0.92 - 1.05  AC:      317.1  mm     G. Age:  35w 4d         25  %    FL/BPD:      71.7  %    71 - 87  FL:       64.1  mm     G. Age:  33w 1d        < 3  %    FL/AC:       20.2  %    20 - 24  HUM:      55.4  mm     G. Age:  32w 2d        < 5  %  Est. FW:    2603   gm   5 lb 12 oz      27  % ---------------------------------------------------------------------- OB History  Gravidity:    4         Term:   1        Prem:   1  Ectopic:      1        Living:  2 ---------------------------------------------------------------------- Gestational Age  LMP:           37w 0d        Date:  09/15/17                 EDD:   06/22/18  U/S Today:     35w 3d                                        EDD:   07/03/18  Best:          37w 0d     Det. By:  LMP  (09/15/17)          EDD:   06/22/18 ---------------------------------------------------------------------- Anatomy  Cranium:               Appears normal         LVOT:                   Appears normal  Cavum:                 Appears normal         Aortic Arch:            Previously seen  Ventricles:            Previously seen        Ductal Arch:            Previously seen  Choroid Plexus:        Previously seen        Diaphragm:              Appears normal  Cerebellum:            Previously seen        Stomach:                Appears normal, left  sided  Posterior Fossa:       Previously seen        Abdomen:                Appears normal  Nuchal Fold:           Previously seen        Abdominal Wall:         Previously seen  Face:                  Orbits and profile     Cord Vessels:           Previously seen                         previously seen  Lips:                  Appears normal         Kidneys:                Appear normal  Palate:                Not well visualized    Bladder:                Appears normal  Thoracic:              Appears normal         Spine:                  Previously seen  Heart:                 Appears normal         Upper Extremities:      Previously seen                         (4CH, axis, and situs  RVOT:                  Previously seen        Lower Extremities:      Previously seen  Other:  Female gender Heels, 5th digit, Nasal bone, and Open hands          previously visualized. ---------------------------------------------------------------------- Impression  Normal interval growth. ---------------------------------------------------------------------- Recommendations  Follow up as clinically indicated. ----------------------------------------------------------------------               Sander Nephew, MD Electronically Signed Final Report   06/02/2018 11:06 am ----------------------------------------------------------------------    MAU Course  Procedures  MDM -monitor for 4hrs d/t fall and r/o abruption/labor -right side of abdomen/right flank tender on initial exam -EFM started @1045 -1445; baseline 140-150, mod variability, multiple accels (15x15), few variable decels. TOCO no ctx, minor irritability. -flexeril given for musculoskeletal pain, but pt vomited about 69min after taking medication and denies improvement or worsening of pain -repeat abdominal exam shows tenderness remains on right side, abdomen soft, left side non-tender -Limited US shows no evidence of abruption on preliminary  report -consulted with Dr. Early Chars, DO re: pt course of treatment, per Dr. Juleen China, pt OK to be discharged home. -pt discharged to home in stable condition  Orders Placed This Encounter  Procedures  . [place] MFM OB LIMITED    Please evaluate placenta    Standing Status:   Standing    Number of Occurrences:   1    Order Specific Question:   Symptom/Reason for  Exam    Answer:   Fall (on) (from) other stairs and steps, initial encounter [[id number]]  . Discharge patient    Order Specific Question:   Discharge disposition    Answer:   01-Home or Self Care [1]    Order Specific Question:   Discharge patient date    Answer:   06/08/2018   Meds ordered this encounter  Medications  . cyclobenzaprine (FLEXERIL) tablet 10 mg     Assessment and Plan   1. Fall (on) (from) other stairs and steps, initial encounter   2. Obesity in pregnancy   3. Supervision of other normal pregnancy, antepartum   4. [redacted] weeks gestation of pregnancy   5. NST (non-stress test) reactive    Allergies as of 06/08/2018      Reactions   Amoxicillin Nausea And Vomiting, Other (See Comments)   Tics & twitches Has patient had a PCN reaction causing immediate rash, facial/tongue/throat swelling, SOB or lightheadedness with hypotension: Yes Has patient had a PCN reaction causing severe rash involving mucus membranes or skin necrosis: No Has patient had a PCN reaction that required hospitalization No Has patient had a PCN reaction occurring within the last 10 years: Yes If all of the above answers are "NO", then may proceed with Cephalosporin use.   Contrast Media [iodinated Diagnostic Agents] Hives   Dilaudid [hydromorphone Hcl] Itching      Medication List    TAKE these medications   acetaminophen 325 MG tablet Commonly known as:  TYLENOL Take 650 mg by mouth every 6 (six) hours as needed for headache.   Comfort Fit Maternity Supp Lg Misc 1 Units by Does not apply route daily.   iron polysaccharides 150 MG  capsule Commonly known as:  NIFEREX Take 1 capsule (150 mg total) by mouth daily.   metoCLOPramide 10 MG tablet Commonly known as:  REGLAN Take 1 tablet (10 mg total) by mouth 3 (three) times daily before meals.   ondansetron 4 MG disintegrating tablet Commonly known as:  Zofran ODT Take 1 tablet (4 mg total) by mouth every 6 (six) hours as needed for nausea.   pantoprazole 20 MG tablet Commonly known as:  PROTONIX Take 1 tablet (20 mg total) by mouth 2 (two) times daily.   prenatal vitamin w/FE, FA 27-1 MG Tabs tablet Take 1 tablet by mouth daily at 12 noon.   promethazine 25 MG tablet Commonly known as:  PHENERGAN Take 1 tablet (25 mg total) by mouth every 6 (six) hours as needed for nausea or vomiting. What changed:  Another medication with the same name was removed. Continue taking this medication, and follow the directions you see here.   valACYclovir 500 MG tablet Commonly known as:  Valtrex Take 1 tablet (500 mg total) by mouth 2 (two) times daily.      -discussed comfort measures for home relief of musculoskeletal pain -abruption/labor/bleeding/return MAU precautions given -f/u with regular OB visit in office 06/10/2018 -pt discharged to home in stable condition  Elmyra Ricks E Nugent 06/08/2018, 5:08 PM

## 2018-06-08 NOTE — MAU Note (Signed)
Started having contractions about 9.  Hurts real bad in stomach and back.  No leaking or bleeding. No recent exams.

## 2018-06-10 ENCOUNTER — Encounter: Payer: Self-pay | Admitting: Nurse Practitioner

## 2018-06-15 ENCOUNTER — Telehealth: Payer: Self-pay

## 2018-06-15 NOTE — Telephone Encounter (Signed)
Pt left VM on Nurse Line 06/12/18 regarding having pain in stomach, black tarry stool, what can she do. Request call back at [phone].

## 2018-06-15 NOTE — Telephone Encounter (Signed)
Called pt regarding nurse line vm about pain in stomach & black tarry stool, no answer, left VM to call us back if still having symptoms. Pt has an appt. on 06/18/18 at clinic.

## 2018-06-18 ENCOUNTER — Other Ambulatory Visit: Payer: Self-pay

## 2018-06-18 ENCOUNTER — Telehealth: Payer: Self-pay | Admitting: Family Medicine

## 2018-06-18 ENCOUNTER — Ambulatory Visit (INDEPENDENT_AMBULATORY_CARE_PROVIDER_SITE_OTHER): Payer: Medicaid Other | Admitting: Obstetrics and Gynecology

## 2018-06-18 VITALS — BP 136/75 | HR 102 | Wt 212.0 lb

## 2018-06-18 DIAGNOSIS — Z8759 Personal history of other complications of pregnancy, childbirth and the puerperium: Secondary | ICD-10-CM

## 2018-06-18 DIAGNOSIS — Z3A39 39 weeks gestation of pregnancy: Secondary | ICD-10-CM

## 2018-06-18 DIAGNOSIS — O98811 Other maternal infectious and parasitic diseases complicating pregnancy, first trimester: Secondary | ICD-10-CM

## 2018-06-18 DIAGNOSIS — Z3480 Encounter for supervision of other normal pregnancy, unspecified trimester: Secondary | ICD-10-CM

## 2018-06-18 DIAGNOSIS — B009 Herpesviral infection, unspecified: Secondary | ICD-10-CM

## 2018-06-18 NOTE — Progress Notes (Signed)
   PRENATAL VISIT NOTE  Subjective:  Emma Chambers is a 28 y.o. [phone] at [redacted]w[redacted]d being seen today for ongoing prenatal care.  She is currently monitored for the following issues for this high-risk pregnancy and has H. pylori infection; Generalized headaches; Pica in adults; Ruptured ectopic pregnancy; Supervision of other normal pregnancy, antepartum; History of gestational hypertension; Obesity in pregnancy; Hyperemesis gravidarum, antepartum; Mild tetrahydrocannabinol (THC) abuse; and HSV (herpes simplex virus) infection on their problem list.  Patient reports no complaints.  Contractions: Irritability. Vag. Bleeding: None.  Movement: Present. Denies leaking of fluid.   The following portions of the patient's history were reviewed and updated as appropriate: allergies, current medications, past family history, past medical history, past social history, past surgical history and problem list.   Objective:   Vitals:   06/18/18 1124  BP: 136/75  Pulse: (!) 102  Weight: 212 lb (96.2 kg)    Fetal Status: Fetal Heart Rate (bpm): 154 Fundal Height: 40 cm Movement: Present     General:  Alert, oriented and cooperative. Patient is in no acute distress.  Skin: Skin is warm and dry. No rash noted.   Cardiovascular: Normal heart rate noted  Respiratory: Normal respiratory effort, no problems with respiration noted  Abdomen: Soft, gravid, appropriate for gestational age.  Pain/Pressure: Present     Pelvic: Cervical exam deferred        Extremities: Normal range of motion.  Edema: Trace  Mental Status: Normal mood and affect. Normal behavior. Normal judgment and thought content.   Assessment and Plan:  Pregnancy: [id number] at [redacted]w[redacted]d  1. Supervision of other normal pregnancy, antepartum  - Discussed no elective surgeries due to Covid 19, Discussed other options for [facility]; Depo vs. Post placental IUD.   2. History of gestational hypertension  BP good today. Not taking ASA  3. HSV (herpes  simplex virus) infection  Continue Valtrex    There are no diagnoses linked to this encounter. Term labor symptoms and general obstetric precautions including but not limited to vaginal bleeding, contractions, leaking of fluid and fetal movement were reviewed in detail with the patient. Please refer to After Visit Summary for other counseling recommendations.   Return in about 4 days (around 06/22/2018) for For BPP/NST .  Future Appointments  Date Time Provider Ronald  06/22/2018 10:15 AM [facility] NST [facility]  06/29/2018  7:30 AM [facility] [place] [facility] None    Noni Saupe, NP

## 2018-06-18 NOTE — Addendum Note (Signed)
Addended by: Noni Saupe I on: 06/18/2018 01:24 PM   Modules accepted: Orders, SmartSet

## 2018-06-18 NOTE — Telephone Encounter (Signed)
Called patient about her appointment needing to be changed due to error in scheduling. Placed on scheduled, and left a VM for her to call if need be.

## 2018-06-21 ENCOUNTER — Other Ambulatory Visit: Payer: Self-pay

## 2018-06-21 ENCOUNTER — Inpatient Hospital Stay (HOSPITAL_COMMUNITY)
Admission: AD | Admit: 2018-06-21 | Discharge: 2018-06-23 | DRG: 806 | Disposition: A | Discharge status: Home / Self Care | Payer: Medicaid Other | Attending: Obstetrics & Gynecology | Admitting: Obstetrics & Gynecology

## 2018-06-21 ENCOUNTER — Inpatient Hospital Stay (HOSPITAL_COMMUNITY): Payer: Medicaid Other | Admitting: Anesthesiology

## 2018-06-21 ENCOUNTER — Encounter (HOSPITAL_COMMUNITY): Payer: Self-pay

## 2018-06-21 DIAGNOSIS — O99324 Drug use complicating childbirth: Secondary | ICD-10-CM | POA: Diagnosis present

## 2018-06-21 DIAGNOSIS — O9902 Anemia complicating childbirth: Secondary | ICD-10-CM | POA: Diagnosis present

## 2018-06-21 DIAGNOSIS — D649 Anemia, unspecified: Secondary | ICD-10-CM | POA: Diagnosis present

## 2018-06-21 DIAGNOSIS — O26893 Other specified pregnancy related conditions, third trimester: Secondary | ICD-10-CM | POA: Diagnosis present

## 2018-06-21 DIAGNOSIS — O99210 Obesity complicating pregnancy, unspecified trimester: Secondary | ICD-10-CM | POA: Diagnosis present

## 2018-06-21 DIAGNOSIS — Z3A39 39 weeks gestation of pregnancy: Secondary | ICD-10-CM

## 2018-06-21 DIAGNOSIS — Z880 Allergy status to penicillin: Secondary | ICD-10-CM | POA: Diagnosis not present

## 2018-06-21 DIAGNOSIS — F1210 Cannabis abuse, uncomplicated: Secondary | ICD-10-CM | POA: Diagnosis present

## 2018-06-21 DIAGNOSIS — O99214 Obesity complicating childbirth: Secondary | ICD-10-CM | POA: Diagnosis present

## 2018-06-21 DIAGNOSIS — A6000 Herpesviral infection of urogenital system, unspecified: Secondary | ICD-10-CM | POA: Diagnosis present

## 2018-06-21 DIAGNOSIS — E669 Obesity, unspecified: Secondary | ICD-10-CM | POA: Diagnosis present

## 2018-06-21 DIAGNOSIS — Z9189 Other specified personal risk factors, not elsewhere classified: Secondary | ICD-10-CM

## 2018-06-21 DIAGNOSIS — O480 Post-term pregnancy: Secondary | ICD-10-CM | POA: Diagnosis present

## 2018-06-21 DIAGNOSIS — B009 Herpesviral infection, unspecified: Secondary | ICD-10-CM | POA: Diagnosis present

## 2018-06-21 DIAGNOSIS — O9832 Other infections with a predominantly sexual mode of transmission complicating childbirth: Secondary | ICD-10-CM | POA: Diagnosis present

## 2018-06-21 DIAGNOSIS — Z87891 Personal history of nicotine dependence: Secondary | ICD-10-CM | POA: Diagnosis not present

## 2018-06-21 DIAGNOSIS — Z8759 Personal history of other complications of pregnancy, childbirth and the puerperium: Secondary | ICD-10-CM

## 2018-06-21 DIAGNOSIS — Z3480 Encounter for supervision of other normal pregnancy, unspecified trimester: Secondary | ICD-10-CM

## 2018-06-21 LAB — RAPID URINE DRUG SCREEN, HOSP PERFORMED
Amphetamines: NOT DETECTED
Barbiturates: NOT DETECTED
Benzodiazepines: NOT DETECTED
Cocaine: NOT DETECTED
Opiates: NOT DETECTED
Tetrahydrocannabinol: POSITIVE — AB

## 2018-06-21 LAB — CBC
HCT: 33.6 % — ABNORMAL LOW (ref 36.0–46.0)
Hemoglobin: 10.5 g/dL — ABNORMAL LOW (ref 12.0–15.0)
MCH: 25.7 pg — ABNORMAL LOW (ref 26.0–34.0)
MCHC: 31.3 g/dL (ref 30.0–36.0)
MCV: 82.4 fL (ref 80.0–100.0)
Platelets: 329 10*3/uL (ref 150–400)
RBC: 4.08 MIL/uL (ref 3.87–5.11)
RDW: 14.7 % (ref 11.5–15.5)
WBC: 11.2 10*3/uL — ABNORMAL HIGH (ref 4.0–10.5)
nRBC: 0 % (ref 0.0–0.2)

## 2018-06-21 LAB — RPR: RPR Ser Ql: NONREACTIVE

## 2018-06-21 MED ORDER — PRENATAL MULTIVITAMIN CH
1.0000 | ORAL_TABLET | Freq: Every day | ORAL | Status: DC
  Administered 2018-06-22 – 2018-06-23 (×2): 1 via ORAL
  Filled 2018-06-21 (×2): qty 1

## 2018-06-21 MED ORDER — PHENYLEPHRINE 40 MCG/ML (10ML) SYRINGE FOR IV PUSH (FOR BLOOD PRESSURE SUPPORT)
80.0000 ug | PREFILLED_SYRINGE | INTRAVENOUS | Status: DC | PRN
  Filled 2018-06-21: qty 10

## 2018-06-21 MED ORDER — COCONUT OIL OIL: 1.0000 "application " | TOPICAL_OIL | Status: DC | PRN

## 2018-06-21 MED ORDER — ONDANSETRON HCL 4 MG PO TABS: 4.0000 mg | ORAL_TABLET | ORAL | Status: DC | PRN

## 2018-06-21 MED ORDER — PHENYLEPHRINE 40 MCG/ML (10ML) SYRINGE FOR IV PUSH (FOR BLOOD PRESSURE SUPPORT)
80.0000 ug | PREFILLED_SYRINGE | INTRAVENOUS | Status: DC | PRN
  Filled 2018-06-21 (×2): qty 10

## 2018-06-21 MED ORDER — TETANUS-DIPHTH-ACELL PERTUSSIS 5-2.5-18.5 LF-MCG/0.5 IM SUSP: 0.5000 mL | Freq: Once | INTRAMUSCULAR | Status: DC

## 2018-06-21 MED ORDER — IBUPROFEN 600 MG PO TABS
600.0000 mg | ORAL_TABLET | Freq: Four times a day (QID) | ORAL | Status: DC
  Administered 2018-06-21 – 2018-06-23 (×8): 600 mg via ORAL
  Filled 2018-06-21 (×8): qty 1

## 2018-06-21 MED ORDER — SOD CITRATE-CITRIC ACID 500-334 MG/5ML PO SOLN: 30.0000 mL | ORAL | Status: DC | PRN

## 2018-06-21 MED ORDER — BENZOCAINE-MENTHOL 20-0.5 % EX AERO
1.0000 "application " | INHALATION_SPRAY | CUTANEOUS | Status: DC | PRN
  Administered 2018-06-21: 1 via TOPICAL
  Filled 2018-06-21: qty 56

## 2018-06-21 MED ORDER — OXYCODONE-ACETAMINOPHEN 5-325 MG PO TABS: 1.0000 | ORAL_TABLET | ORAL | Status: DC | PRN

## 2018-06-21 MED ORDER — EPHEDRINE 5 MG/ML INJ
10.0000 mg | INTRAVENOUS | Status: DC | PRN
  Filled 2018-06-21: qty 2

## 2018-06-21 MED ORDER — DIPHENHYDRAMINE HCL 50 MG/ML IJ SOLN: 12.5000 mg | INTRAMUSCULAR | Status: DC | PRN

## 2018-06-21 MED ORDER — LACTATED RINGERS IV SOLN
INTRAVENOUS | Status: DC
  Administered 2018-06-21 (×3): via INTRAVENOUS

## 2018-06-21 MED ORDER — LACTATED RINGERS IV SOLN: 500.0000 mL | INTRAVENOUS | Status: DC | PRN

## 2018-06-21 MED ORDER — DIBUCAINE 1 % RE OINT: 1.0000 "application " | TOPICAL_OINTMENT | RECTAL | Status: DC | PRN

## 2018-06-21 MED ORDER — LACTATED RINGERS IV SOLN
500.0000 mL | Freq: Once | INTRAVENOUS | Status: AC
  Administered 2018-06-21: 05:00:00 500 mL via INTRAVENOUS

## 2018-06-21 MED ORDER — ACETAMINOPHEN 325 MG PO TABS
650.0000 mg | ORAL_TABLET | ORAL | Status: DC | PRN
  Administered 2018-06-21 – 2018-06-22 (×2): 650 mg via ORAL
  Filled 2018-06-21 (×2): qty 2

## 2018-06-21 MED ORDER — DIPHENHYDRAMINE HCL 25 MG PO CAPS: 25.0000 mg | ORAL_CAPSULE | Freq: Four times a day (QID) | ORAL | Status: DC | PRN

## 2018-06-21 MED ORDER — OXYTOCIN 40 UNITS IN NORMAL SALINE INFUSION - SIMPLE MED
INTRAVENOUS | Status: AC
  Administered 2018-06-21: 12:00:00 500 mL via INTRAVENOUS
  Filled 2018-06-21: qty 1000

## 2018-06-21 MED ORDER — ZOLPIDEM TARTRATE 5 MG PO TABS: 5.0000 mg | ORAL_TABLET | Freq: Every evening | ORAL | Status: DC | PRN

## 2018-06-21 MED ORDER — MEASLES, MUMPS & RUBELLA VAC IJ SOLR: 0.5000 mL | Freq: Once | INTRAMUSCULAR | Status: DC

## 2018-06-21 MED ORDER — ONDANSETRON HCL 4 MG/2ML IJ SOLN: 4.0000 mg | INTRAMUSCULAR | Status: DC | PRN

## 2018-06-21 MED ORDER — ONDANSETRON HCL 4 MG/2ML IJ SOLN
4.0000 mg | Freq: Four times a day (QID) | INTRAMUSCULAR | Status: DC | PRN
  Administered 2018-06-21: 4 mg via INTRAVENOUS
  Filled 2018-06-21: qty 2

## 2018-06-21 MED ORDER — ACETAMINOPHEN 325 MG PO TABS: 650.0000 mg | ORAL_TABLET | ORAL | Status: DC | PRN

## 2018-06-21 MED ORDER — WITCH HAZEL-GLYCERIN EX PADS: 1.0000 "application " | MEDICATED_PAD | CUTANEOUS | Status: DC | PRN

## 2018-06-21 MED ORDER — OXYTOCIN BOLUS FROM INFUSION
500.0000 mL | Freq: Once | INTRAVENOUS | Status: AC
  Administered 2018-06-21: 500 mL via INTRAVENOUS

## 2018-06-21 MED ORDER — LIDOCAINE HCL (PF) 1 % IJ SOLN: 30.0000 mL | INTRAMUSCULAR | Status: DC | PRN

## 2018-06-21 MED ORDER — SIMETHICONE 80 MG PO CHEW: 80.0000 mg | CHEWABLE_TABLET | ORAL | Status: DC | PRN

## 2018-06-21 MED ORDER — FAMOTIDINE 20 MG PO TABS
20.0000 mg | ORAL_TABLET | Freq: Once | ORAL | Status: AC
  Administered 2018-06-21: 20 mg via ORAL
  Filled 2018-06-21: qty 1

## 2018-06-21 MED ORDER — SENNOSIDES-DOCUSATE SODIUM 8.6-50 MG PO TABS
2.0000 | ORAL_TABLET | ORAL | Status: DC
  Administered 2018-06-22 (×2): 2 via ORAL
  Filled 2018-06-21 (×2): qty 2

## 2018-06-21 MED ORDER — LIDOCAINE-EPINEPHRINE (PF) 2 %-1:200000 IJ SOLN
INTRAMUSCULAR | Status: DC | PRN
  Administered 2018-06-21 (×2): 3 mL via EPIDURAL

## 2018-06-21 MED ORDER — OXYCODONE-ACETAMINOPHEN 5-325 MG PO TABS: 2.0000 | ORAL_TABLET | ORAL | Status: DC | PRN

## 2018-06-21 MED ORDER — SODIUM CHLORIDE (PF) 0.9 % IJ SOLN
INTRAMUSCULAR | Status: DC | PRN
  Administered 2018-06-21: 12 mL/h via EPIDURAL

## 2018-06-21 MED ORDER — FENTANYL-BUPIVACAINE-NACL 0.5-0.125-0.9 MG/250ML-% EP SOLN
12.0000 mL/h | EPIDURAL | Status: DC | PRN
  Filled 2018-06-21: qty 250

## 2018-06-21 NOTE — H&P (Signed)
OBSTETRIC ADMISSION HISTORY AND PHYSICAL  Emma Chambers is a 28 y.o. female [phone] with IUP at [redacted]w[redacted]d by L/7 presenting for spontaneous onset of labor.   Reports fetal movement. Denies vaginal bleeding, leakage of fluids.   She received her prenatal care at [facility].  Support person in labor: None  Ultrasounds . 7w1: viability U/S with small subchorionic hemorrhage . 19w0: normal anatomy U/S, anterior placenta  . 27w0: EFW 56% . 31w0: EFW 38% . 37w0: EFW 27%  Prenatal History/Complications: . 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 History of gHTN . Obesity in pregnancy (BMI 41) . THC use in pregnancy  . hyperemesis  Past Medical History: Past Medical History:  Diagnosis Date  . Anemia   . Gallstones   . H. pylori infection   . Headache(784.0)    chronic  . Kidney stones   . Ovarian cyst   . Pregnancy induced hypertension   . UTI (lower urinary tract infection)    Past Surgical History: Past Surgical History:  Procedure Laterality Date  . APPENDECTOMY    . CHOLECYSTECTOMY N/A 07/27/2012   Procedure: LAPAROSCOPIC CHOLECYSTECTOMY WITH INTRAOPERATIVE CHOLANGIOGRAM;  Surgeon: Madilyn Hook, DO;  Location: [facility];  Service: General;  Laterality: N/A;  . DIAGNOSTIC LAPAROSCOPY WITH REMOVAL OF ECTOPIC PREGNANCY N/A 12/27/2014   Procedure: DIAGNOSTIC LAPAROSCOPY, Right Salpingectomy with removal ECTOPIC PREGNANCY;  Surgeon: [name], MD;  Location: [facility];  Service: Gynecology;  Laterality: N/A;  . LAPAROSCOPIC APPENDECTOMY N/A 12/09/2012   Procedure: APPENDECTOMY LAPAROSCOPIC;  Surgeon: Gwenyth Ober, MD;  Location: [facility];  Service: General;  Laterality: N/A;    Obstetrical History: OB History    Gravida  4   Para  2   Term  1   Preterm  1   AB  1   Living  2     SAB  0   TAB  0   Ectopic  1   Multiple  0   Live Births  2        Obstetric Comments  2014: IOL at 29 weeks 2/2 gallstones 2013: GHTN 2016: ectopic pregnancy: Had  methotrexate and then had surgery         Social History: Social History   Socioeconomic History  . Marital status: Single    Spouse name: Not on file  . Number of children: Not on file  . Years of education: Not on file  . Highest education level: Not on file  Occupational History  . Not on file  Social Needs  . Financial resource strain: Not on file  . Food insecurity:    Worry: Never true    Inability: Never true  . Transportation needs:    Medical: No    Non-medical: No  Tobacco Use  . Smoking status: Former Smoker    Types: Cigarettes  . Smokeless tobacco: Never Used  . Tobacco comment: 2014  Substance and Sexual Activity  . Alcohol use: Never    Frequency: Never    Comment: social  . Drug use: Not Currently    Types: Marijuana  . Sexual activity: Yes    Birth control/protection: None    Comment: last IC- approx one month ago  Lifestyle  . Physical activity:    Days per week: Not on file    Minutes per session: Not on file  . Stress: Not on file  Relationships  . Social connections:    Talks on phone: Not on file  Gets together: Not on file    Attends religious service: Not on file    Active member of club or organization: Not on file    Attends meetings of clubs or organizations: Not on file    Relationship status: Not on file  Other Topics Concern  . Not on file  Social History Narrative  . Not on file    Family History: Family History  Problem Relation Age of Onset  . Asthma Mother   . Hypertension Mother   . Parkinsonism Mother   . Asthma Brother   . Diabetes Maternal Grandmother   . Hypertension Maternal Grandmother   . Stroke Maternal Grandmother   . Heart disease Maternal Grandmother   . Lupus Maternal Aunt   . Heart disease Maternal Grandfather   . Anesthesia problems Neg Hx   . Malignant hyperthermia Neg Hx   . Pseudochol deficiency Neg Hx   . Hypotension Neg Hx     Allergies: Allergies  Allergen Reactions  . Amoxicillin  Nausea And Vomiting and Other (See Comments)    Tics & twitches Has patient had a PCN reaction causing immediate rash, facial/tongue/throat swelling, SOB or lightheadedness with hypotension: Yes Has patient had a PCN reaction causing severe rash involving mucus membranes or skin necrosis: No Has patient had a PCN reaction that required hospitalization No Has patient had a PCN reaction occurring within the last 10 years: Yes If all of the above answers are "NO", then may proceed with Cephalosporin use.  . Contrast Media [Iodinated Diagnostic Agents] Hives  . Dilaudid [Hydromorphone Hcl] Itching    Medications Prior to Admission  Medication Sig Dispense Refill Last Dose  . acetaminophen (TYLENOL) 325 MG tablet Take 650 mg by mouth every 6 (six) hours as needed for headache.   Taking  . Elastic Bandages & Supports (COMFORT FIT MATERNITY SUPP LG) MISC 1 Units by Does not apply route daily. (Patient not taking: Reported on 03/24/2018) 1 each 0 Not Taking  . iron polysaccharides (NIFEREX) 150 MG capsule Take 1 capsule (150 mg total) by mouth daily. 30 capsule 1 Taking  . metoCLOPramide (REGLAN) 10 MG tablet Take 1 tablet (10 mg total) by mouth 3 (three) times daily before meals. (Patient not taking: Reported on 04/20/2018) 90 tablet 2 Not Taking  . ondansetron (ZOFRAN ODT) 4 MG disintegrating tablet Take 1 tablet (4 mg total) by mouth every 6 (six) hours as needed for nausea. (Patient not taking: Reported on 04/20/2018) 20 tablet 0 Not Taking  . pantoprazole (PROTONIX) 20 MG tablet Take 1 tablet (20 mg total) by mouth 2 (two) times daily. (Patient not taking: Reported on 04/07/2018) 60 tablet 6 Not Taking  . prenatal vitamin w/FE, FA (PRENATAL 1 + 1) 27-1 MG TABS tablet Take 1 tablet by mouth daily at 12 noon. (Patient not taking: Reported on 05/20/2018) 30 each 11 Not Taking  . promethazine (PHENERGAN) 25 MG tablet Take 1 tablet (25 mg total) by mouth every 6 (six) hours as needed for nausea or vomiting. 30  tablet 1 Taking  . valACYclovir (VALTREX) 500 MG tablet Take 1 tablet (500 mg total) by mouth 2 (two) times daily. 60 tablet 2 Taking     Review of Systems  All systems reviewed and negative except as stated in HPI  Blood pressure 129/77, pulse (!) 109, temperature 98.5 F (36.9 C), temperature source Oral, resp. rate 20, last menstrual period 09/15/2017, SpO2 100 %. General appearance: alert, comfortable appearing with epidural now in place Lungs: no  respiratory distress Heart: regular rate  Abdomen: soft, non-tender; gravid  Pelvic: deferred Extremities: no significant LE edema Presentation: cephalic by RN check  Fetal monitoring: 140s/mod/+a/-d Uterine activity: irregular, every 2-5 minutes Dilation: 4 Effacement (%): 70 Station: -1 Exam by:: Gilmer Mor RN  Prenatal labs: ABO, Rh: A/Positive/-- (09/16 1610) Antibody: Negative (09/16 9604) Rubella: 4.01 (09/16 5409) RPR: Non Reactive (01/07 0000)  HBsAg: Negative (09/16 0923)  HIV: Non Reactive (01/07 0000)  GBS:   negative Glucola: no GTT completed, A1C 5.0 Genetic screening:  Low risk NIPS  Prenatal Transfer Tool  Maternal Diabetes: No Genetic Screening: Normal Maternal Ultrasounds/Referrals: Normal Fetal Ultrasounds or other Referrals:  None Maternal Substance Abuse: THC Significant Maternal Medications:  iron, Zofran, protonix, phenergan, valtrex, PNV Significant Maternal Lab Results: HSV(+),   No results found for this or any previous visit (from the past 24 hour(s)).  Patient Active Problem List   Diagnosis Date Noted  . Indication for care in labor or delivery 06/21/2018  . HSV (herpes simplex virus) infection 04/29/2018  . Mild tetrahydrocannabinol (THC) abuse 04/16/2018  . Hyperemesis gravidarum, antepartum 03/25/2018  . Obesity in pregnancy 03/24/2018  . Supervision of other normal pregnancy, antepartum 12/01/2017  . History of gestational hypertension 12/01/2017  . Ruptured ectopic pregnancy  12/27/2014  . 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 Generalized headaches 03/27/2011  . H. pylori infection 02/13/2011    Assessment/Plan:  Emma Chambers is a 28 y.o. [id number] at [redacted]w[redacted]d here for spontaneous onset of labor.   Labor: SOL. Expectant management.  -- pain control: epidural in place  Fetal Wellbeing: EFW 6lbs by Leopold's. Cephalic by sutures on prior RN check.  -- GBS (negative) -- continuous fetal monitoring - category I   Postpartum Planning -- breast/IUD-Depo -- RI/ declined Tdap   Mitesh Rosendahl S. Juleen China, DO OB/GYN Fellow

## 2018-06-21 NOTE — Anesthesia Postprocedure Evaluation (Signed)
Anesthesia Post Note  Patient: Emma Chambers  Procedure(s) Performed: AN AD HOC LABOR EPIDURAL     Patient location during evaluation: Mother Baby Anesthesia Type: Epidural Level of consciousness: awake and alert Pain management: pain level controlled Vital Signs Assessment: post-procedure vital signs reviewed and stable Respiratory status: spontaneous breathing, nonlabored ventilation and respiratory function stable Cardiovascular status: stable Postop Assessment: no headache, no backache and epidural receding Anesthetic complications: no    Last Vitals:  Vitals:   06/21/18 1216 06/21/18 1231  BP: (!) 102/54 108/67  Pulse: (!) 106 (!) 130  Resp:    Temp:    SpO2:      Last Pain:  Vitals:   06/21/18 1216  TempSrc:   PainSc: 0-No pain   Pain Goal: Patients Stated Pain Goal: 0 (06/21/18 0256)              Epidural/Spinal Function Cutaneous sensation: Pins and Needles (06/21/18 1231), Patient able to flex knees: No(minimal mvmt w/ right leg, left leg still numb) (06/21/18 1231), Patient able to lift hips off bed: Yes (06/21/18 1231), Back pain beyond tenderness at insertion site: No (06/21/18 1231), Progressively worsening motor and/or sensory loss: No (06/21/18 1231), Bowel and/or bladder incontinence post epidural: No (06/21/18 1158)  Wilfred Siverson

## 2018-06-21 NOTE — Plan of Care (Signed)
  Problem: Clinical Measurements: Goal: Will remain free from infection Outcome: Progressing   Problem: Education: Goal: Ability to make informed decisions regarding treatment and plan of care will improve Outcome: Progressing

## 2018-06-21 NOTE — Anesthesia Procedure Notes (Signed)
Epidural Patient location during procedure: OB Start time: 06/21/2018 5:25 AM End time: 06/21/2018 5:40 AM  Staffing Anesthesiologist: Freddrick March, MD Performed: anesthesiologist   Preanesthetic Checklist Completed: patient identified, pre-op evaluation, timeout performed, IV checked, risks and benefits discussed and monitors and equipment checked  Epidural Patient position: sitting Prep: site prepped and draped and DuraPrep Patient monitoring: continuous pulse ox, blood pressure, heart rate and cardiac monitor Approach: midline Location: L3-L4 Injection technique: LOR air  Needle:  Needle type: Tuohy  Needle gauge: 17 G Needle length: 9 cm Needle insertion depth: 9 cm Catheter type: closed end flexible Catheter size: 19 Gauge Catheter at skin depth: 15 cm Test dose: negative  Assessment Sensory level: T8 Events: blood not aspirated, injection not painful, no injection resistance, negative IV test and no paresthesia  Additional Notes Patient identified. Risks/Benefits/Options discussed with patient including but not limited to bleeding, infection, nerve damage, paralysis, failed block, incomplete pain control, headache, blood pressure changes, nausea, vomiting, reactions to medication both or allergic, itching and postpartum back pain. Confirmed with bedside nurse the patient's most recent platelet count. Confirmed with patient that they are not currently taking any anticoagulation, have any bleeding history or any family history of bleeding disorders. Patient expressed understanding and wished to proceed. All questions were answered. Sterile technique was used throughout the entire procedure. Please see nursing notes for vital signs. Test dose was given through epidural catheter and negative prior to continuing to dose epidural or start infusion. Warning signs of high block given to the patient including shortness of breath, tingling/numbness in hands, complete motor block, or  any concerning symptoms with instructions to call for help. Patient was given instructions on fall risk and not to get out of bed. All questions and concerns addressed with instructions to call with any issues or inadequate analgesia.  Reason for block:procedure for pain

## 2018-06-21 NOTE — Discharge Summary (Signed)
OB Discharge Summary     Patient Name: Emma Chambers DOB: 07-Jun-1990 MRN: [id number]  Date of admission: 06/21/2018 Delivering MD: Nicolette Bang   Date of discharge: 06/23/2018  Admitting diagnosis: 39wks, contractions Intrauterine pregnancy: [redacted]w[redacted]d     Secondary diagnosis:  Principal Problem:   Indication for care in labor or delivery Active Problems:   History of gestational hypertension   Obesity in pregnancy   Mild tetrahydrocannabinol (THC) abuse   HSV (herpes simplex virus) infection  Additional problems: None     Discharge diagnosis: Term Pregnancy Delivered                                                                                                Post partum procedures: Depo  Augmentation: None  Complications: None  Hospital course:  Onset of Labor With Vaginal Delivery     28 y.o. yo [id number] at [redacted]w[redacted]d was admitted in Active Labor on 06/21/2018. Patient had an uncomplicated labor course as follows:  Membrane Rupture Time/Date: 6:00 AM ,06/21/2018   Intrapartum Procedures: Episiotomy: None [1]                                         Lacerations:  None [1]  Patient had a delivery of a Viable infant. 06/21/2018  Information for the patient's newborn:  Torianne, Laflam [[id number]]  Delivery Method: Curtiss had an uncomplicated postpartum course.  She is ambulating, tolerating a regular diet, passing flatus, and urinating well. Patient is discharged home in stable condition on 06/23/18.   Physical exam  Vitals:   06/22/18 0038 06/22/18 0500 06/22/18 2115 06/23/18 0533  BP: (!) 108/55 105/65 (!) 122/30 110/70  Pulse: 74 76 87 88  Resp: 20 20 18 18   Temp: 97.9 F (36.6 C) 97.7 F (36.5 C) 98.8 F (37.1 C) 98.3 F (36.8 C)  TempSrc: Oral  Oral Oral  SpO2: 100% 100% 100%   Weight:      Height:       General: alert, cooperative and no distress Lochia: appropriate Uterine Fundus: firm Incision: N/A DVT Evaluation: No evidence  of DVT seen on physical exam. Labs: Lab Results  Component Value Date   WBC 11.2 (H) 06/21/2018   HGB 10.5 (L) 06/21/2018   HCT 33.6 (L) 06/21/2018   MCV 82.4 06/21/2018   PLT 329 06/21/2018   CMP Latest Ref Rng & Units 03/25/2018  Glucose 70 - 99 mg/dL 71  BUN 6 - 20 mg/dL 6  Creatinine 0.44 - 1.00 mg/dL 0.44  Sodium 135 - 145 mmol/L 135  Potassium 3.5 - 5.1 mmol/L 3.6  Chloride 98 - 111 mmol/L 107  CO2 22 - 32 mmol/L 20(L)  Calcium 8.9 - 10.3 mg/dL 8.6(L)  Total Protein 6.5 - 8.1 g/dL 6.6  Total Bilirubin 0.3 - 1.2 mg/dL 0.1(L)  Alkaline Phos 38 - 126 U/L 61  AST 15 - 41 U/L 16  ALT 0 - 44 U/L 8    Discharge instruction:  per After Visit Summary and "Baby and Me Booklet".  After visit meds:  Allergies as of 06/23/2018      Reactions   Amoxicillin Nausea And Vomiting, Other (See Comments)   Tics & twitches Has patient had a PCN reaction causing immediate rash, facial/tongue/throat swelling, SOB or lightheadedness with hypotension: Yes Has patient had a PCN reaction causing severe rash involving mucus membranes or skin necrosis: No Has patient had a PCN reaction that required hospitalization No Has patient had a PCN reaction occurring within the last 10 years: Yes If all of the above answers are "NO", then may proceed with Cephalosporin use.   Contrast Media [iodinated Diagnostic Agents] Hives   Dilaudid [hydromorphone Hcl] Itching      Medication List    STOP taking these medications   Comfort Fit Maternity Supp Lg Misc   metoCLOPramide 10 MG tablet Commonly known as:  REGLAN   ondansetron 4 MG disintegrating tablet Commonly known as:  Zofran ODT   pantoprazole 20 MG tablet Commonly known as:  PROTONIX   promethazine 25 MG tablet Commonly known as:  PHENERGAN     TAKE these medications   ibuprofen 600 MG tablet Commonly known as:  ADVIL,MOTRIN Take 1 tablet (600 mg total) by mouth every 6 (six) hours.   iron polysaccharides 150 MG capsule Commonly known  as:  NIFEREX Take 1 capsule (150 mg total) by mouth daily.   valACYclovir 500 MG tablet Commonly known as:  Valtrex Take 1 tablet (500 mg total) by mouth 2 (two) times daily.       Diet: routine diet  Activity: Advance as tolerated. Pelvic rest for 6 weeks.   Outpatient follow up:4 weeks Follow up Appt: No future appointments. Follow up Visit:No follow-ups on file.  Postpartum contraception: Depo Provera  Newborn Data: Live born female  Birth Weight: 5lb 12.1oz   APGAR: 60, 9  Newborn Delivery   Birth date/time:  06/21/2018 11:52:00 Delivery type:  Vaginal, Spontaneous     Baby Feeding: Breast Disposition:home with mother   06/23/2018 Aura Camps, MD

## 2018-06-21 NOTE — MAU Note (Addendum)
Ctxs for an hour and getting stronger. Denies LOF. Has been taking Valtrex and no signs of outbreak per pt. 2cm last sve

## 2018-06-21 NOTE — Anesthesia Preprocedure Evaluation (Signed)
Anesthesia Evaluation  Patient identified by MRN, date of birth, ID band Patient awake    Reviewed: Allergy & Precautions, NPO status , Patient's Chart, lab work & pertinent test results  Airway Mallampati: II  TM Distance: >3 FB Neck ROM: Full    Dental no notable dental hx.    Pulmonary neg pulmonary ROS, former smoker,    Pulmonary exam normal breath sounds clear to auscultation       Cardiovascular negative cardio ROS Normal cardiovascular exam Rhythm:Regular Rate:Normal     Neuro/Psych  Headaches, negative psych ROS   GI/Hepatic negative GI ROS, Neg liver ROS,   Endo/Other  Morbid obesity  Renal/GU negative Renal ROS  negative genitourinary   Musculoskeletal negative musculoskeletal ROS (+)   Abdominal   Peds  Hematology  (+) Blood dyscrasia, anemia ,   Anesthesia Other Findings gHTN  Reproductive/Obstetrics (+) Pregnancy                             Anesthesia Physical Anesthesia Plan  ASA: III  Anesthesia Plan: Epidural   Post-op Pain Management:    Induction:   PONV Risk Score and Plan: Treatment may vary due to age or medical condition  Airway Management Planned: Natural Airway  Additional Equipment:   Intra-op Plan:   Post-operative Plan:   Informed Consent: I have reviewed the patients History and Physical, chart, labs and discussed the procedure including the risks, benefits and alternatives for the proposed anesthesia with the patient or authorized representative who has indicated his/her understanding and acceptance.       Plan Discussed with: Anesthesiologist  Anesthesia Plan Comments: (Patient identified. Risks, benefits, options discussed with patient including but not limited to bleeding, infection, nerve damage, paralysis, failed block, incomplete pain control, headache, blood pressure changes, nausea, vomiting, reactions to medication, itching, and  post partum back pain. Confirmed with bedside nurse the patient's most recent platelet count. Confirmed with the patient that they are not taking any anticoagulation, have any bleeding history or any family history of bleeding disorders. Patient expressed understanding and wishes to proceed. All questions were answered. )        Anesthesia Quick Evaluation

## 2018-06-22 ENCOUNTER — Other Ambulatory Visit: Payer: Self-pay

## 2018-06-22 LAB — TYPE AND SCREEN
ABO/RH(D): A POS
Antibody Screen: NEGATIVE

## 2018-06-22 MED ORDER — MEDROXYPROGESTERONE ACETATE 150 MG/ML IM SUSP
150.0000 mg | Freq: Once | INTRAMUSCULAR | Status: AC
  Administered 2018-06-23: 150 mg via INTRAMUSCULAR
  Filled 2018-06-22: qty 1

## 2018-06-22 MED ORDER — OXYCODONE-ACETAMINOPHEN 5-325 MG PO TABS
2.0000 | ORAL_TABLET | Freq: Once | ORAL | Status: AC
  Administered 2018-06-22: 05:00:00 1 via ORAL
  Filled 2018-06-22: qty 2

## 2018-06-22 MED ORDER — KETOROLAC TROMETHAMINE 60 MG/2ML IM SOLN: 60.0000 mg | Freq: Once | INTRAMUSCULAR | Status: DC

## 2018-06-22 MED ORDER — OXYCODONE-ACETAMINOPHEN 5-325 MG PO TABS: 2.0000 | ORAL_TABLET | Freq: Four times a day (QID) | ORAL | Status: DC | PRN

## 2018-06-22 NOTE — Lactation Note (Signed)
This note was copied from a baby's chart. Lactation Consultation Note  Patient Name: Girl Braeley Buskey [id number] Date: 06/22/2018 Reason for consult: Initial assessment;Infant < 6lbs;Term Baby is 14 hours old.  Mom stated she desired to breast and formula feed on admission.  She has attempted to latch twice but now has decided to exclusively formula feed.  Explained that we would want to initiate pumping if she would like to breastfeed.  Mom states she would rather do bottle feeding and"won't stick with it'.  Baby is not doing well with bottle.  Speech consult will be ordered.  Maternal Data    Feeding Feeding Type: Bottle Fed - Formula Nipple Type: Slow - flow  LATCH Score                   Interventions    Lactation Tools Discussed/Used     Consult Status Consult Status: Complete    Xavion Muscat S 06/22/2018, 11:19 AM

## 2018-06-22 NOTE — Plan of Care (Signed)
Mom is progressing well.

## 2018-06-22 NOTE — Clinical Social Work Maternal (Addendum)
CLINICAL SOCIAL WORK MATERNAL/CHILD NOTE  Patient Details  Name: Emma Chambers MRN: [id number] Date of Birth: [birth year]  Date:  06/22/2018  Clinical Social Worker Initiating Note:  Jeanette Caprice S. Lucinda Spells, [place]  Date/Time: Initiated:  06/22/18/0905     Child's Name:  Emma Chambers    Biological Parents:  Mother(Emma Chambers )   Need for Interpreter:  None   Reason for Referral:  Current Substance Use/Substance Use During Pregnancy    Address:  [place] [address]    Phone number:  [phone] (home)     Additional phone number:  Household Members/Support Persons (HM/SP):   Household Member/Support Person 3   HM/SP Name Relationship DOB or Age  HM/SP -1   Sibling Boy       HM/SP -2   Sibling (girl)       HM/SP -3 Emma Chambers (MOB)     HM/SP -4        HM/SP -5        HM/SP -6        HM/SP -7        HM/SP -8          Natural Supports (not living in the home):  Immediate Family(MOB's Mom)   Professional Supports: None   Employment: Unemployed   Type of Work:   none  Education:  Other (comment)   Homebound arranged:  no  Museum/gallery curator Resources:  [employer]   Other Resources:  [employer], Physicist, medical    Cultural/Religious Considerations Which May Impact Care:  none reported   Strengths:  Ability to meet basic needs , Compliance with medical plan , Home prepared for child    Psychotropic Medications:         Pediatrician:       Pediatrician List:   [place]      Pediatrician Fax Number:    Risk Factors/Current Problems:  Substance Use    Cognitive State:  Alert    Mood/Affect:  Relaxed    CSW Assessment: CSW consulted as MOB tested positive for THC use upon admission to hospital. CSW wen to speak with MOB at bedside to address concerns and further needs.Upon CSW's arrival to [facility] room, CSW observed that MOB, RN , and infant were all at bedside.  MOB was watching RN care for infant. CSW sought permission to return or continue to assess MOB. MOB and RN both requested that CSW remain in the room and proceed.   CSW began conversation by congratulating MOB on the birth of infant daughter. CSW also explained reason for visit as well as CSW's role in the hospital. MOB initially appeared to be uninterested in speaking with CSW about anything but as conversation progressed MOB opened up. .CSW was advised by MOB that yes she did use THC while pregnant. MOB reports that she did this so that she could eat as MOB reports that she wasn't eating much. MOB asked CSW "why do yall ask me about this and I already told my MD that I did use". CSW advised MOB of hospital drug policy and protocol regarding substance use during preganancy. CSW advised MOB that [facility] use is still considered illegal in [place] CSW's are obligated to abide by laws and report any substance use during pregnancy. MOB reported "I understand that but I am a private person and I dont like  people coming into my house". CSW validated MOB's feelings and suggested to MOB that if infant's UDS or Cord came back positive for any substance CSW was obligated to make a CPS report. MOB verbalized understanding.   CSW explained to MOB that CSW's are here to offer further supports and resources to [facility] as needed. MOB reported to CSW that she has to other children ages 43, and 87. MOB reports a history of anxiety and depression. MOB unsure when she was diagnosed (possibly 2014 per MOB reports) but does recall being prescribed medications but refused to take them. MOB report that she was also diagnosed with PTSD by probation officer who MOB is no longer seeing. MOB reports that in order to cope when feeling anxiety or depression she turns to her children. MOB reports that her children are her coping mechanism. MOB also expressed that she has support from her mom and no othersupports. MOB declined to  give information on FOB at this time. MOB reports that she was seeing someone at [facility] int he past but no longer is seeing this person and doesn't wish to see them. MOB reported "I stopped seeing them because they ask to many questions and I hated that". CSW understanding of this but also encouraged MOB to restart therapy with [facility] if helpful for MOB.   MOB reports that when she is becoming depressed she starts to become irritable and not wanting to talk to anyone.   MOB reports that she isn't having signs or symptoms of depression or anxiety at this time. CSW providedMOB With information on therapist that MOB can reach out to if signs or symptoms start to appear. 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 CSW suggested to MOB that she cal Medicaid worker to inform them of the birth of infant. MOB reports that she would call and get all of that set up. MOB notified CSW that she is currently not working.   MOB still looking over Pediatrician information and then reports that she would be choosing one. MOB expressed having all needed items to care for infant at this time. CSW reiterated drug screen policy in the hospital and allowed MOB to ask further questions-none presented. CSW provided MOB with information on safe sleep as well as PPD and Baby Blues.   CSW Plan/Description:  Sudden Infant Death Syndrome (SIDS) Education, Other Patient/Family Education, Perinatal Mood and Anxiety Disorder (PMADs) Education, [place], CSW Will Continue to Monitor Umbilical Cord Tissue Drug Screen Results and Make Report if Waldon Reining 06/22/2018, 10:47 AM

## 2018-06-22 NOTE — Progress Notes (Signed)
Post Partum Day 1 Subjective: Emma Chambers has no complaints this morning. Ambulating, voiding, tolerating diet and good oral pain control.  Objective: Blood pressure 105/65, pulse 76, temperature 97.7 F (36.5 C), resp. rate 20, height 5' (1.524 m), weight 96.2 kg, last menstrual period 09/15/2017, SpO2 100 %, unknown if currently breastfeeding.  Physical Exam:  General: alert Lochia: appropriate Uterine Fundus: firm Incision: healing well DVT Evaluation: No evidence of DVT seen on physical exam.  Recent Labs    06/21/18 0424  HGB 10.5*  HCT 33.6*    Assessment/Plan: Plan for discharge tomorrow Depo Provera for birth control.    LOS: 1 day   Chancy Milroy 06/22/2018, 11:30 AM

## 2018-06-22 NOTE — Progress Notes (Signed)
Patient c/o pressure and cramping, not relieved by motrin, tylenol, hot packs, frequent voiding.  Patient has been firm, midline, and U1 or UE. And VS WNL.  Notified Darrol Poke, CNM.  New orders.

## 2018-06-23 ENCOUNTER — Encounter: Payer: Self-pay | Admitting: *Deleted

## 2018-06-23 LAB — ABO/RH: ABO/RH(D): A POS

## 2018-06-23 MED ORDER — IBUPROFEN 600 MG PO TABS: 600.0000 mg | ORAL_TABLET | Freq: Four times a day (QID) | ORAL | 0 refills | Status: DC

## 2018-06-23 MED ORDER — FAMOTIDINE 20 MG PO TABS
20.0000 mg | ORAL_TABLET | Freq: Two times a day (BID) | ORAL | Status: DC
  Administered 2018-06-23: 20 mg via ORAL
  Filled 2018-06-23: qty 1

## 2018-06-23 NOTE — Progress Notes (Signed)
CSW received consult due to score 17 on Edinburgh Depression Screen.    CSW spoke with MOB At bedside to review Edinburgh Score. MOB was calm and willing to speak with CSW about score. CSW sought further details on why MOB has been feeling this way and MOB stated "that's just how ive been feeling". CSW asked MOB if there were certain things that were making her feel this way, MOB stated "No". CSW educated MOB on resources for PPD as well as ensured that MOB had all information to keep track of feelings at this time. CSW also provided further eduction on the importance of utilizing therapy as needed. MOB was educated on the importance of utilizing support during this time . MOB expressed that her best friend would be coming over to help her with her children. MOB reports no safety concerns to CSW.   CSW provided education regarding Baby Blues vs PMADs and provided MOB with resources for mental health follow up.  CSW encouraged MOB to evaluate her mental health throughout the postpartum period with the use of the New Mom Checklist developed by Postpartum Progress as well as the [place] Postnatal Depression Scale and notify a medical professional if symptoms arise.       Virgie Dad Sunny Gains, MSW, LCSW-A [facility] [place] [facility]  [phone]

## 2018-06-24 ENCOUNTER — Encounter: Payer: Self-pay | Admitting: Medical

## 2018-06-29 ENCOUNTER — Inpatient Hospital Stay (HOSPITAL_COMMUNITY): Payer: Medicaid Other

## 2018-06-29 ENCOUNTER — Inpatient Hospital Stay (HOSPITAL_COMMUNITY): Admission: AD | Admit: 2018-06-29 | Payer: Medicaid Other | Source: Home / Self Care | Admitting: Family Medicine

## 2018-07-06 ENCOUNTER — Telehealth: Payer: Self-pay | Admitting: Family Medicine

## 2018-07-06 NOTE — Telephone Encounter (Signed)
Called the patient to inform of the upcoming appointment. Left a detailed voicemail message.

## 2018-07-27 ENCOUNTER — Ambulatory Visit: Payer: Medicaid Other

## 2018-07-27 DIAGNOSIS — Z91199 Patient's noncompliance with other medical treatment and regimen due to unspecified reason: Secondary | ICD-10-CM

## 2018-07-27 DIAGNOSIS — Z5329 Procedure and treatment not carried out because of patient's decision for other reasons: Secondary | ICD-10-CM

## 2018-07-27 NOTE — Progress Notes (Signed)
Called Pt at 8:53, no answer, left VM advised will retry in 10 to 15 mins.  Called again at 9;04, still no answer, leftVM that her appointment will have to be rescheduled.

## 2018-07-27 NOTE — Progress Notes (Signed)
Attempted to call patient x2 for virtual visit. Unable to reach. Will reschedule  Wende Mott, CNM 07/27/18 9:08 AM

## 2018-08-13 ENCOUNTER — Emergency Department (HOSPITAL_COMMUNITY)
Admission: EM | Admit: 2018-08-13 | Discharge: 2018-08-13 | Disposition: A | Discharge status: Home / Self Care | Payer: Medicaid Other | Attending: Emergency Medicine | Admitting: Emergency Medicine

## 2018-08-13 ENCOUNTER — Emergency Department (HOSPITAL_COMMUNITY): Payer: Medicaid Other

## 2018-08-13 ENCOUNTER — Other Ambulatory Visit: Payer: Self-pay

## 2018-08-13 ENCOUNTER — Encounter (HOSPITAL_COMMUNITY): Payer: Self-pay | Admitting: Emergency Medicine

## 2018-08-13 DIAGNOSIS — Z87891 Personal history of nicotine dependence: Secondary | ICD-10-CM | POA: Diagnosis not present

## 2018-08-13 DIAGNOSIS — I951 Orthostatic hypotension: Secondary | ICD-10-CM | POA: Diagnosis not present

## 2018-08-13 DIAGNOSIS — R51 Headache: Secondary | ICD-10-CM | POA: Insufficient documentation

## 2018-08-13 DIAGNOSIS — R55 Syncope and collapse: Secondary | ICD-10-CM | POA: Insufficient documentation

## 2018-08-13 DIAGNOSIS — Z79899 Other long term (current) drug therapy: Secondary | ICD-10-CM | POA: Insufficient documentation

## 2018-08-13 DIAGNOSIS — R42 Dizziness and giddiness: Secondary | ICD-10-CM | POA: Insufficient documentation

## 2018-08-13 LAB — I-STAT BETA HCG BLOOD, ED (MC, WL, AP ONLY): I-stat hCG, quantitative: <5 m[IU]/mL (ref <5)

## 2018-08-13 LAB — URINALYSIS, ROUTINE W REFLEX MICROSCOPIC
Bacteria, UA: NONE SEEN
Bilirubin Urine: NEGATIVE
Glucose, UA: NEGATIVE mg/dL
Ketones, ur: NEGATIVE mg/dL
Nitrite: NEGATIVE
Protein, ur: NEGATIVE mg/dL
RBC / HPF: >50 RBC/hpf — ABNORMAL HIGH (ref 0–5)
Specific Gravity, Urine: 1.027 (ref 1.005–1.030)
pH: 5 (ref 5.0–8.0)

## 2018-08-13 LAB — COMPREHENSIVE METABOLIC PANEL
ALT: 11 U/L (ref 0–44)
AST: 15 U/L (ref 15–41)
Albumin: 3.8 g/dL (ref 3.5–5.0)
Alkaline Phosphatase: 67 U/L (ref 38–126)
Anion gap: 7 (ref 5–15)
BUN: 9 mg/dL (ref 6–20)
CO2: 23 mmol/L (ref 22–32)
Calcium: 9.4 mg/dL (ref 8.9–10.3)
Chloride: 111 mmol/L (ref 98–111)
Creatinine, Ser: 0.68 mg/dL (ref 0.44–1.00)
GFR calc Af Amer: >60 mL/min (ref >60)
GFR calc non Af Amer: >60 mL/min (ref >60)
Glucose, Bld: 87 mg/dL (ref 70–99)
Potassium: 3.7 mmol/L (ref 3.5–5.1)
Sodium: 141 mmol/L (ref 135–145)
Total Bilirubin: 0.2 mg/dL — ABNORMAL LOW (ref 0.3–1.2)
Total Protein: 6.7 g/dL (ref 6.5–8.1)

## 2018-08-13 LAB — CBC WITH DIFFERENTIAL/PLATELET
Abs Immature Granulocytes: 0.03 10*3/uL (ref 0.00–0.07)
Basophils Absolute: 0 10*3/uL (ref 0.0–0.1)
Basophils Relative: 0 %
Eosinophils Absolute: 0.5 10*3/uL (ref 0.0–0.5)
Eosinophils Relative: 6 %
HCT: 36.6 % (ref 36.0–46.0)
Hemoglobin: 11.6 g/dL — ABNORMAL LOW (ref 12.0–15.0)
Immature Granulocytes: 0 %
Lymphocytes Relative: 32 %
Lymphs Abs: 2.5 10*3/uL (ref 0.7–4.0)
MCH: 26.4 pg (ref 26.0–34.0)
MCHC: 31.7 g/dL (ref 30.0–36.0)
MCV: 83.4 fL (ref 80.0–100.0)
Monocytes Absolute: 0.4 10*3/uL (ref 0.1–1.0)
Monocytes Relative: 6 %
Neutro Abs: 4.3 10*3/uL (ref 1.7–7.7)
Neutrophils Relative %: 56 %
Platelets: 327 10*3/uL (ref 150–400)
RBC: 4.39 MIL/uL (ref 3.87–5.11)
RDW: 14.8 % (ref 11.5–15.5)
WBC: 7.7 10*3/uL (ref 4.0–10.5)
nRBC: 0 % (ref 0.0–0.2)

## 2018-08-13 LAB — LIPASE, BLOOD: Lipase: 37 U/L (ref 11–51)

## 2018-08-13 LAB — CBG MONITORING, ED: Glucose-Capillary: 84 mg/dL (ref 70–99)

## 2018-08-13 MED ORDER — SODIUM CHLORIDE 0.9 % IV BOLUS: 1000.0000 mL | Freq: Once | INTRAVENOUS | Status: DC

## 2018-08-13 NOTE — ED Notes (Signed)
Pt able to use her phone. Hospital phone is placed at bedside as well.

## 2018-08-13 NOTE — ED Notes (Signed)
Patient Alert and oriented to baseline. Stable and ambulatory to baseline. Patient verbalized understanding of the discharge instructions.  Patient belongings were taken by the patient.   

## 2018-08-13 NOTE — ED Triage Notes (Signed)
Pt states she has been having "black out periods" lately. Feels frequent dizziness, state she had a baby recently on 4/5. Denies any abnormal bleeding

## 2018-08-13 NOTE — Discharge Instructions (Addendum)
Your passing out episode is likely due to lack of fluid in your body.  Drink plenty of fluid.  Follow up with your doctor for further care.  Return if you have any concerns.
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
## 2018-08-13 NOTE — ED Notes (Signed)
This RN acting as Art therapist and asked pt if she would like for me to update any family/friends at this time. Pt declined and is able to inform them herself.
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
## 2018-09-17 ENCOUNTER — Encounter: Payer: Self-pay | Admitting: Obstetrics and Gynecology

## 2018-09-17 ENCOUNTER — Telehealth (INDEPENDENT_AMBULATORY_CARE_PROVIDER_SITE_OTHER): Payer: Medicaid Other | Admitting: Obstetrics and Gynecology

## 2018-09-17 ENCOUNTER — Other Ambulatory Visit: Payer: Self-pay

## 2018-09-17 ENCOUNTER — Telehealth: Payer: Self-pay | Admitting: Obstetrics and Gynecology

## 2018-09-17 DIAGNOSIS — Z3009 Encounter for other general counseling and advice on contraception: Secondary | ICD-10-CM

## 2018-09-17 NOTE — Telephone Encounter (Signed)
Left a voicemail message instructing the patient of how enter the virtual visit via mychart. Informed the patient is she has any questions please call our office at [phone].

## 2018-09-17 NOTE — Progress Notes (Signed)
TELEHEALTH VIRTUAL GYNECOLOGY VISIT ENCOUNTER NOTE  I connected with [name] on 09/17/18 at  3:55 PM EDT by telephone at home and verified that I am speaking with the correct person using two identifiers.   I discussed the limitations, risks, security and privacy concerns of performing an evaluation and management service by telephone and the availability of in person appointments. I also discussed with the patient that there may be a patient responsible charge related to this service. The patient expressed understanding and agreed to proceed.  Chief Complaint: BTL consult  History:  Emma Chambers is a 28 y.o. African-American [phone] (No LMP recorded.), seen for the above chief complaint. Her past medical history is significant for h/o right salpingectomy for ectopic, h/o laparoscopic appendectomy, h/o laparoscopic cholecystectomy, BMI 30s.  Patient no show'ed to PP visit after SVD on 06/21/2018. Pt got depo on 4/6 while inpatient. Has occasional spotting with the depo     Past Medical History:  Diagnosis Date  . Anemia   . Gallstones   . H. pylori infection   . Headache(784.0)    chronic  . History of gestational hypertension 12/01/2017   -baseline labs at [facility] - Start baby ASA    Ref. Range 12/01/2017 09:57 Protein/Creat Ratio Latest Ref Range: 0 - 200 mg/g creat 135   Ref. Range 12/01/2017 09:23 AST Latest Ref Range: 0 - 40 IU/L 14 ALT Latest Ref Range: 0 - 32 IU/L 11   Ref. Range 12/01/2017 09:23 Platelets Latest Ref Range: 150 - 450 x10E3/uL 343   . 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 Kidney stones   . Obesity in pregnancy 03/24/2018  . Ovarian cyst   . Pregnancy induced hypertension   . UTI (lower urinary tract infection)    Past Surgical History:  Procedure Laterality Date  . CHOLECYSTECTOMY N/A 07/27/2012   Procedure: LAPAROSCOPIC CHOLECYSTECTOMY WITH INTRAOPERATIVE CHOLANGIOGRAM;  Surgeon: Madilyn Hook, DO;  Location:  [facility];  Service: General;  Laterality: N/A;  . DIAGNOSTIC LAPAROSCOPY WITH REMOVAL OF ECTOPIC PREGNANCY N/A 12/27/2014   Procedure: DIAGNOSTIC LAPAROSCOPY, Right Salpingectomy with removal ECTOPIC PREGNANCY;  Surgeon: [name], MD;  Location: [facility];  Service: Gynecology;  Laterality: N/A;  . LAPAROSCOPIC APPENDECTOMY N/A 12/09/2012   Procedure: APPENDECTOMY LAPAROSCOPIC;  Surgeon: Gwenyth Ober, MD;  Location: [facility];  Service: General;  Laterality: N/A;   The following portions of the patient's history were reviewed and updated as appropriate: allergies, current medications, past family history, past medical history, past social history, past surgical history and problem list.   Health Maintenance: Pap history unknown  Review of Systems:  Pertinent items noted in HPI and remainder of comprehensive ROS otherwise negative.  Physical Exam:   General:  Alert, oriented and cooperative.   Mental Status: Normal mood and affect perceived. Normal judgment and thought content.  Physical exam deferred due to nature of the encounter  Labs and Imaging No results found for this or any previous visit (from the past 336 hour(s)). No results found.    Assessment and Plan:     1. Encounter for other general counseling or advice on contraception D/w her re: options and risk of surgeries, permanency. Prior op notes in MyChart reviewed. Pt would like BTL Pt to come in for depo on Monday in the interim and to sign BTL papers and ROI to get pap smear records from [facility]  I discussed the assessment and treatment  plan with the patient. The patient was provided an opportunity to ask questions and all were answered. The patient agreed with the plan and demonstrated an understanding of the instructions.   The patient was advised to call back or seek an in-person evaluation/go to the ED if the symptoms worsen or if the condition fails to improve as anticipated.  I provided 15 minutes of non-face-to-face time  during this encounter. The visit was done via a MyChart Video visit.    Aletha Halim, MD [facility] [place] [facility]

## 2018-09-17 NOTE — Progress Notes (Signed)
I connected with  Emma Chambers on 09/17/18 at  3:55 PM EDT by telephone/MyChart  and verified that I am speaking with the correct person using two identifiers.   I discussed the limitations, risks, security and privacy concerns of performing an evaluation and management service by telephone and the availability of in person appointments. I also discussed with the patient that there may be a patient responsible charge related to this service. The patient expressed understanding and agreed to proceed.  Alric Seton, [place] 09/17/2018  3:57 PM

## 2018-09-21 ENCOUNTER — Ambulatory Visit: Payer: Medicaid Other

## 2018-09-22 ENCOUNTER — Telehealth: Payer: Self-pay | Admitting: Family Medicine

## 2018-09-22 NOTE — Telephone Encounter (Signed)
Spoke with patient about her appointment on 7/8 @ 9:00. Patient was instructed to wear a face mask for the entire appointment and no visitors are allowed with her. Patient screened for covid symptoms and denied having any.

## 2018-09-23 ENCOUNTER — Other Ambulatory Visit: Payer: Self-pay

## 2018-09-23 ENCOUNTER — Ambulatory Visit (INDEPENDENT_AMBULATORY_CARE_PROVIDER_SITE_OTHER): Payer: Medicaid Other

## 2018-09-23 DIAGNOSIS — Z3042 Encounter for surveillance of injectable contraceptive: Secondary | ICD-10-CM | POA: Diagnosis not present

## 2018-09-23 MED ORDER — MEDROXYPROGESTERONE ACETATE 150 MG/ML IM SUSP
150.0000 mg | Freq: Once | INTRAMUSCULAR | Status: AC
  Administered 2018-09-23: 10:00:00 150 mg via INTRAMUSCULAR

## 2018-09-23 NOTE — Progress Notes (Signed)
[name] here for Depo-Provera  Injection.  Injection administered without complication. Patient will return in 3 months for next injection.  BTL paperwork signed today.     Verdell Carmine, RN 09/23/2018  9:05 AM

## 2018-10-28 ENCOUNTER — Encounter (HOSPITAL_BASED_OUTPATIENT_CLINIC_OR_DEPARTMENT_OTHER): Payer: Self-pay | Admitting: *Deleted

## 2018-10-28 ENCOUNTER — Other Ambulatory Visit: Payer: Self-pay

## 2018-10-31 ENCOUNTER — Other Ambulatory Visit (HOSPITAL_COMMUNITY)
Admission: RE | Admit: 2018-10-31 | Discharge: 2018-10-31 | Disposition: A | Discharge status: Home / Self Care | Payer: Medicaid Other | Source: Ambulatory Visit | Attending: Obstetrics and Gynecology | Admitting: Obstetrics and Gynecology

## 2018-10-31 DIAGNOSIS — Z20828 Contact with and (suspected) exposure to other viral communicable diseases: Secondary | ICD-10-CM | POA: Diagnosis not present

## 2018-10-31 DIAGNOSIS — Z01812 Encounter for preprocedural laboratory examination: Secondary | ICD-10-CM | POA: Insufficient documentation

## 2018-10-31 LAB — SARS CORONAVIRUS 2 (TAT 6-24 HRS): SARS Coronavirus 2: NEGATIVE

## 2018-11-03 ENCOUNTER — Other Ambulatory Visit: Payer: Self-pay | Admitting: Obstetrics and Gynecology

## 2018-11-04 ENCOUNTER — Encounter (HOSPITAL_BASED_OUTPATIENT_CLINIC_OR_DEPARTMENT_OTHER)
Admission: RE | Disposition: A | Discharge status: Home / Self Care | Payer: Self-pay | Source: Home / Self Care | Attending: Obstetrics and Gynecology

## 2018-11-04 ENCOUNTER — Other Ambulatory Visit: Payer: Self-pay

## 2018-11-04 ENCOUNTER — Ambulatory Visit (HOSPITAL_BASED_OUTPATIENT_CLINIC_OR_DEPARTMENT_OTHER): Payer: Medicaid Other | Admitting: Certified Registered"

## 2018-11-04 ENCOUNTER — Encounter (HOSPITAL_BASED_OUTPATIENT_CLINIC_OR_DEPARTMENT_OTHER): Payer: Self-pay

## 2018-11-04 ENCOUNTER — Ambulatory Visit (HOSPITAL_BASED_OUTPATIENT_CLINIC_OR_DEPARTMENT_OTHER)
Admission: RE | Admit: 2018-11-04 | Discharge: 2018-11-04 | Disposition: A | Discharge status: Home / Self Care | Payer: Medicaid Other | Attending: Obstetrics and Gynecology | Admitting: Obstetrics and Gynecology

## 2018-11-04 DIAGNOSIS — Z9889 Other specified postprocedural states: Secondary | ICD-10-CM

## 2018-11-04 DIAGNOSIS — D759 Disease of blood and blood-forming organs, unspecified: Secondary | ICD-10-CM | POA: Insufficient documentation

## 2018-11-04 DIAGNOSIS — R51 Headache: Secondary | ICD-10-CM | POA: Insufficient documentation

## 2018-11-04 DIAGNOSIS — Z302 Encounter for sterilization: Secondary | ICD-10-CM | POA: Insufficient documentation

## 2018-11-04 DIAGNOSIS — B009 Herpesviral infection, unspecified: Secondary | ICD-10-CM | POA: Diagnosis not present

## 2018-11-04 DIAGNOSIS — Z6841 Body Mass Index (BMI) 40.0 and over, adult: Secondary | ICD-10-CM | POA: Insufficient documentation

## 2018-11-04 DIAGNOSIS — Z87891 Personal history of nicotine dependence: Secondary | ICD-10-CM | POA: Diagnosis not present

## 2018-11-04 DIAGNOSIS — D649 Anemia, unspecified: Secondary | ICD-10-CM | POA: Insufficient documentation

## 2018-11-04 HISTORY — PX: LAPAROSCOPIC TUBAL LIGATION: SHX1937

## 2018-11-04 LAB — POCT PREGNANCY, URINE: Preg Test, Ur: NEGATIVE

## 2018-11-04 SURGERY — LIGATION, FALLOPIAN TUBE, LAPAROSCOPIC
Anesthesia: General | Site: Abdomen | Laterality: Bilateral

## 2018-11-04 MED ORDER — SUGAMMADEX SODIUM 200 MG/2ML IV SOLN
INTRAVENOUS | Status: DC | PRN
  Administered 2018-11-04: 200 mg via INTRAVENOUS

## 2018-11-04 MED ORDER — ACETAMINOPHEN 160 MG/5ML PO SOLN: 325.0000 mg | ORAL | Status: DC | PRN

## 2018-11-04 MED ORDER — OXYCODONE HCL 5 MG/5ML PO SOLN: 5.0000 mg | Freq: Once | ORAL | Status: DC | PRN

## 2018-11-04 MED ORDER — MIDAZOLAM HCL 2 MG/2ML IJ SOLN
1.0000 mg | INTRAMUSCULAR | Status: DC | PRN
  Administered 2018-11-04: 2 mg via INTRAVENOUS

## 2018-11-04 MED ORDER — SUGAMMADEX SODIUM 500 MG/5ML IV SOLN
INTRAVENOUS | Status: AC
  Filled 2018-11-04: qty 5

## 2018-11-04 MED ORDER — POLYETHYLENE GLYCOL 3350 17 G PO PACK: 17.0000 g | PACK | Freq: Every day | ORAL | 0 refills | Status: DC | PRN

## 2018-11-04 MED ORDER — ACETAMINOPHEN 10 MG/ML IV SOLN
1000.0000 mg | Freq: Once | INTRAVENOUS | Status: AC
  Administered 2018-11-04: 1000 mg via INTRAVENOUS

## 2018-11-04 MED ORDER — MIDAZOLAM HCL 2 MG/2ML IJ SOLN
INTRAMUSCULAR | Status: AC
  Filled 2018-11-04: qty 2

## 2018-11-04 MED ORDER — IBUPROFEN 600 MG PO TABS: 600.0000 mg | ORAL_TABLET | Freq: Four times a day (QID) | ORAL | 0 refills | Status: DC | PRN

## 2018-11-04 MED ORDER — PROMETHAZINE HCL 25 MG/ML IJ SOLN
INTRAMUSCULAR | Status: AC
  Filled 2018-11-04: qty 1

## 2018-11-04 MED ORDER — DEXAMETHASONE SODIUM PHOSPHATE 10 MG/ML IJ SOLN
INTRAMUSCULAR | Status: DC | PRN
  Administered 2018-11-04: 5 mg via INTRAVENOUS

## 2018-11-04 MED ORDER — LIDOCAINE HCL (CARDIAC) PF 100 MG/5ML IV SOSY
PREFILLED_SYRINGE | INTRAVENOUS | Status: DC | PRN
  Administered 2018-11-04: 80 mg via INTRAVENOUS

## 2018-11-04 MED ORDER — PROMETHAZINE HCL 25 MG/ML IJ SOLN
12.5000 mg | Freq: Once | INTRAMUSCULAR | Status: DC | PRN
  Administered 2018-11-04: 12:00:00 12.5 mg via INTRAVENOUS

## 2018-11-04 MED ORDER — ACETAMINOPHEN 10 MG/ML IV SOLN
INTRAVENOUS | Status: AC
  Filled 2018-11-04: qty 100

## 2018-11-04 MED ORDER — LACTATED RINGERS IV SOLN
INTRAVENOUS | Status: DC
  Administered 2018-11-04: 10:00:00 via INTRAVENOUS

## 2018-11-04 MED ORDER — ROCURONIUM BROMIDE 100 MG/10ML IV SOLN
INTRAVENOUS | Status: DC | PRN
  Administered 2018-11-04: 40 mg via INTRAVENOUS

## 2018-11-04 MED ORDER — SOD CITRATE-CITRIC ACID 500-334 MG/5ML PO SOLN
30.0000 mL | ORAL | Status: AC
  Administered 2018-11-04: 30 mL via ORAL

## 2018-11-04 MED ORDER — ONDANSETRON HCL 4 MG/2ML IJ SOLN
INTRAMUSCULAR | Status: DC | PRN
  Administered 2018-11-04: 4 mg via INTRAVENOUS

## 2018-11-04 MED ORDER — KETOROLAC TROMETHAMINE 30 MG/ML IJ SOLN
INTRAMUSCULAR | Status: AC
  Filled 2018-11-04: qty 1

## 2018-11-04 MED ORDER — OXYCODONE-ACETAMINOPHEN 5-325 MG PO TABS: 1.0000 | ORAL_TABLET | Freq: Four times a day (QID) | ORAL | 0 refills | Status: DC | PRN

## 2018-11-04 MED ORDER — SOD CITRATE-CITRIC ACID 500-334 MG/5ML PO SOLN
ORAL | Status: AC
  Filled 2018-11-04: qty 30

## 2018-11-04 MED ORDER — FENTANYL CITRATE (PF) 100 MCG/2ML IJ SOLN
INTRAMUSCULAR | Status: AC
  Filled 2018-11-04: qty 2

## 2018-11-04 MED ORDER — ONDANSETRON HCL 4 MG/2ML IJ SOLN
INTRAMUSCULAR | Status: AC
  Filled 2018-11-04: qty 2

## 2018-11-04 MED ORDER — DEXAMETHASONE SODIUM PHOSPHATE 10 MG/ML IJ SOLN
INTRAMUSCULAR | Status: AC
  Filled 2018-11-04: qty 1

## 2018-11-04 MED ORDER — FENTANYL CITRATE (PF) 100 MCG/2ML IJ SOLN
50.0000 ug | INTRAMUSCULAR | Status: DC | PRN
  Administered 2018-11-04 (×2): 50 ug via INTRAVENOUS

## 2018-11-04 MED ORDER — KETOROLAC TROMETHAMINE 30 MG/ML IJ SOLN
INTRAMUSCULAR | Status: DC | PRN
  Administered 2018-11-04: 30 mg via INTRAVENOUS

## 2018-11-04 MED ORDER — ROCURONIUM BROMIDE 10 MG/ML (PF) SYRINGE
PREFILLED_SYRINGE | INTRAVENOUS | Status: AC
  Filled 2018-11-04: qty 20

## 2018-11-04 MED ORDER — PHENYLEPHRINE 40 MCG/ML (10ML) SYRINGE FOR IV PUSH (FOR BLOOD PRESSURE SUPPORT)
PREFILLED_SYRINGE | INTRAVENOUS | Status: AC
  Filled 2018-11-04: qty 10

## 2018-11-04 MED ORDER — OXYCODONE HCL 5 MG PO TABS
ORAL_TABLET | ORAL | Status: AC
  Filled 2018-11-04: qty 1

## 2018-11-04 MED ORDER — SODIUM CHLORIDE (PF) 0.9 % IJ SOLN
INTRAMUSCULAR | Status: AC
  Filled 2018-11-04: qty 10

## 2018-11-04 MED ORDER — ONDANSETRON HCL 4 MG/2ML IJ SOLN: 4.0000 mg | Freq: Once | INTRAMUSCULAR | Status: DC | PRN

## 2018-11-04 MED ORDER — ACETAMINOPHEN 325 MG PO TABS: 325.0000 mg | ORAL_TABLET | ORAL | Status: DC | PRN

## 2018-11-04 MED ORDER — LACTATED RINGERS IV SOLN: INTRAVENOUS | Status: DC

## 2018-11-04 MED ORDER — BUPIVACAINE HCL 0.5 % IJ SOLN
INTRAMUSCULAR | Status: DC | PRN
  Administered 2018-11-04: 5 mL
  Administered 2018-11-04: 15 mL

## 2018-11-04 MED ORDER — LIDOCAINE 2% (20 MG/ML) 5 ML SYRINGE
INTRAMUSCULAR | Status: AC
  Filled 2018-11-04: qty 10

## 2018-11-04 MED ORDER — PROPOFOL 10 MG/ML IV BOLUS
INTRAVENOUS | Status: DC | PRN
  Administered 2018-11-04: 160 mg via INTRAVENOUS

## 2018-11-04 MED ORDER — MEPERIDINE HCL 25 MG/ML IJ SOLN: 6.2500 mg | INTRAMUSCULAR | Status: DC | PRN

## 2018-11-04 MED ORDER — SILVER NITRATE-POT NITRATE 75-25 % EX MISC
CUTANEOUS | Status: DC | PRN
  Administered 2018-11-04: 2

## 2018-11-04 MED ORDER — FENTANYL CITRATE (PF) 100 MCG/2ML IJ SOLN
25.0000 ug | INTRAMUSCULAR | Status: DC | PRN
  Administered 2018-11-04: 25 ug via INTRAVENOUS

## 2018-11-04 MED ORDER — OXYCODONE HCL 5 MG PO TABS: 5.0000 mg | ORAL_TABLET | Freq: Once | ORAL | Status: DC | PRN

## 2018-11-04 SURGICAL SUPPLY — 46 items
BLADE SURG 15 STRL LF DISP TIS (BLADE) ×1 IMPLANT
BLADE SURG 15 STRL SS (BLADE) ×2
BRIEF STRETCH FOR OB PAD XXL (UNDERPADS AND DIAPERS) ×3 IMPLANT
CATH ROBINSON RED A/P 14FR (CATHETERS) IMPLANT
CLIP FILSHIE TUBAL LIGA STRL (Clip) ×3 IMPLANT
DERMABOND ADVANCED (GAUZE/BANDAGES/DRESSINGS) ×2
DERMABOND ADVANCED .7 DNX12 (GAUZE/BANDAGES/DRESSINGS) ×1 IMPLANT
DRSG OPSITE POSTOP 3X4 (GAUZE/BANDAGES/DRESSINGS) ×3 IMPLANT
DURAPREP 26ML APPLICATOR (WOUND CARE) ×3 IMPLANT
GAUZE 4X4 16PLY RFD (DISPOSABLE) ×3 IMPLANT
GLOVE BIO SURGEON STRL SZ 6.5 (GLOVE) ×2 IMPLANT
GLOVE BIO SURGEONS STRL SZ 6.5 (GLOVE) ×1
GLOVE BIOGEL PI IND STRL 6.5 (GLOVE) ×2 IMPLANT
GLOVE BIOGEL PI IND STRL 7.0 (GLOVE) ×4 IMPLANT
GLOVE BIOGEL PI IND STRL 7.5 (GLOVE) ×2 IMPLANT
GLOVE BIOGEL PI INDICATOR 6.5 (GLOVE) ×4
GLOVE BIOGEL PI INDICATOR 7.0 (GLOVE) ×8
GLOVE BIOGEL PI INDICATOR 7.5 (GLOVE) ×4
GLOVE SURG SS PI 7.0 STRL IVOR (GLOVE) ×3 IMPLANT
GOWN STRL REUS W/ TWL LRG LVL3 (GOWN DISPOSABLE) ×1 IMPLANT
GOWN STRL REUS W/TWL LRG LVL3 (GOWN DISPOSABLE) ×5 IMPLANT
NEEDLE INSUFFLATION 14GA 120MM (NEEDLE) IMPLANT
NEEDLE INSUFFLATION 150MM (ENDOMECHANICALS) IMPLANT
PACK LAPAROSCOPY BASIN (CUSTOM PROCEDURE TRAY) ×3 IMPLANT
PACK TRENDGUARD 450 HYBRID PRO (MISCELLANEOUS) ×1 IMPLANT
PACK TRENDGUARD 600 HYBRD PROC (MISCELLANEOUS) IMPLANT
PAD ARMBOARD 7.5X6 YLW CONV (MISCELLANEOUS) ×6 IMPLANT
PAD OB MATERNITY 4.3X12.25 (PERSONAL CARE ITEMS) ×3 IMPLANT
PAD PREP 24X48 CUFFED NSTRL (MISCELLANEOUS) ×3 IMPLANT
SET TUBE SMOKE EVAC HIGH FLOW (TUBING) ×3 IMPLANT
SLEEVE SCD COMPRESS KNEE MED (MISCELLANEOUS) ×3 IMPLANT
SUT MNCRL AB 4-0 PS2 18 (SUTURE) IMPLANT
SUT PLAIN 2 0 (SUTURE)
SUT PLAIN ABS 2-0 CT1 27XMFL (SUTURE) IMPLANT
SUT VICRYL 0 UR6 27IN ABS (SUTURE) ×3 IMPLANT
SUT VICRYL 4-0 PS2 18IN ABS (SUTURE) ×3 IMPLANT
TOWEL GREEN STERILE FF (TOWEL DISPOSABLE) ×3 IMPLANT
TRAY FOLEY W/BAG SLVR 14FR LF (SET/KITS/TRAYS/PACK) ×3 IMPLANT
TRENDGUARD 450 HYBRID PRO PACK (MISCELLANEOUS) ×3
TRENDGUARD 600 HYBRID PROC PK (MISCELLANEOUS)
TROCAR 5M 150ML BLDLS (TROCAR) IMPLANT
TROCAR ADV FIXATION 5X100MM (TROCAR) IMPLANT
TROCAR BALLN 12MMX100 BLUNT (TROCAR) ×3 IMPLANT
TROCAR BALLN GELPORT 12X130M (ENDOMECHANICALS) IMPLANT
TROCAR XCEL NON-BLD 11X100MML (ENDOMECHANICALS) IMPLANT
WARMER LAPAROSCOPE (MISCELLANEOUS) ×3 IMPLANT

## 2018-11-04 NOTE — Discharge Instructions (Addendum)
Laparoscopic Surgery Discharge Instructions °The following list should answer your most common questions.  Although we will discuss your surgery and post-operative instructions with you prior to your discharge, this list will serve as a reminder if you fail to recall the details of what we discussed. ° °We will discuss your surgery once again in detail at your post-op visit in two to four weeks. If you haven’t already done so, please call to make your appointment as soon as possible. ° °How you will feel: Although you have just undergone a major surgery, your recovery will be significantly shorter since the surgery was performed through much smaller incisions than the traditional approach.  You should feel slightly better each day.  If you suddenly feel much worse than the prior day, please call the clinic.  It’s important during the early part of your recovery that you maintain some activity.  Walking is encouraged.  You will quicken your recovery by continued activity. ° °Incision:  Your incisions will be closed with dissolvable stitches or surgical adhesive (glue).  There may be Band-aids and/or Steri-strips covering your incisions.  If there is no drainage from the incisions you may remove the Band-aids in one to two days.  You may notice some minor bruising at the incision sites.  This is common and will resolve within several days.  Please inform us if the redness at the edges of your incision appears to be spreading.  If the skin around your incision becomes warm to the touch, or if you notice a pus-like drainage, please call the office. ° °Stairs/Driving/Activities: You may climb stairs if necessary.  If you’ve had general anesthesia, do not drive a car the rest of the day today.  You may begin light housework when you feel up to it, but avoid heavy lifting (more than 15-20lbs) or pushing until cleared for these activities by your physician. ° °Hygiene:  Do not soak your incisions.  Showers are acceptable  but you may not take a bath or swim in a pool.  Cleanse your incisions daily with soap and water. ° °Medications:  Please resume taking any medications that you were taking prior to the surgery.  If we have prescribed any new medications for you, please take them as directed. ° °Constipation:  It is fairly common to experience some difficulty in moving your bowels following major surgery.  Being active will help to reduce this likelihood. A diet rich in fiber and plenty of liquids is desirable.  If you do become constipated, a mild laxative such as Miralax, Milk of Magnesia, or Metamucil, or a stool softener such as Colace, is recommended. ° °General Instructions: If you develop a fever of 100.5 degrees or higher, please call the office number(s) below for physician on call. ° ° ° °Post Anesthesia Home Care Instructions ° °Activity: °Get plenty of rest for the remainder of the day. A responsible individual must stay with you for 24 hours following the procedure.  °For the next 24 hours, DO NOT: °-Drive a car °-Operate machinery °-Drink alcoholic beverages °-Take any medication unless instructed by your physician °-Make any legal decisions or sign important papers. ° °Meals: °Start with liquid foods such as gelatin or soup. Progress to regular foods as tolerated. Avoid greasy, spicy, heavy foods. If nausea and/or vomiting occur, drink only clear liquids until the nausea and/or vomiting subsides. Call your physician if vomiting continues. ° °Special Instructions/Symptoms: °Your throat may feel dry or sore from the anesthesia or the breathing   tube placed in your throat during surgery. If this causes discomfort, gargle with warm salt water. The discomfort should disappear within 24 hours. ° °If you had a scopolamine patch placed behind your ear for the management of post- operative nausea and/or vomiting: ° °1. The medication in the patch is effective for 72 hours, after which it should be removed.  Wrap patch in a  tissue and discard in the trash. Wash hands thoroughly with soap and water. °2. You may remove the patch earlier than 72 hours if you experience unpleasant side effects which may include dry mouth, dizziness or visual disturbances. °3. Avoid touching the patch. Wash your hands with soap and water after contact with the patch. °   ° ° °

## 2018-11-04 NOTE — Anesthesia Postprocedure Evaluation (Signed)
Anesthesia Post Note  Patient: Emma Chambers  Procedure(s) Performed: LAPAROSCOPIC TUBAL LIGATION with Flishie Clip Left (Bilateral Abdomen)     Patient location during evaluation: PACU Anesthesia Type: General Level of consciousness: awake and alert Pain management: pain level controlled Vital Signs Assessment: post-procedure vital signs reviewed and stable Respiratory status: spontaneous breathing, nonlabored ventilation, respiratory function stable and patient connected to nasal cannula oxygen Cardiovascular status: blood pressure returned to baseline and stable Postop Assessment: no apparent nausea or vomiting Anesthetic complications: no    Last Vitals:  Vitals:   11/04/18 1145 11/04/18 1200  BP: 111/70 115/85  Pulse: 76 82  Resp: 14 19  Temp:    SpO2: 100% 100%    Last Pain:  Vitals:   11/04/18 1200  TempSrc:   PainSc: 9                  Juliona Vales

## 2018-11-04 NOTE — Anesthesia Procedure Notes (Signed)
Procedure Name: Intubation Date/Time: 11/04/2018 10:12 AM Performed by: Lavonia Dana, CRNA Pre-anesthesia Checklist: Patient identified, Emergency Drugs available, Suction available and Patient being monitored Patient Re-evaluated:Patient Re-evaluated prior to induction Oxygen Delivery Method: Circle system utilized Preoxygenation: Pre-oxygenation with 100% oxygen Induction Type: IV induction, Cricoid Pressure applied and Rapid sequence Ventilation: Mask ventilation without difficulty Laryngoscope Size: Mac and 3 Grade View: Grade I Tube type: Oral Tube size: 7.0 mm Number of attempts: 1 Airway Equipment and Method: Stylet and Oral airway Placement Confirmation: ETT inserted through vocal cords under direct vision,  positive ETCO2 and breath sounds checked- equal and bilateral Secured at: 20 cm Tube secured with: Tape Dental Injury: Teeth and Oropharynx as per pre-operative assessment

## 2018-11-04 NOTE — Transfer of Care (Signed)
Immediate Anesthesia Transfer of Care Note  Patient: Emma Chambers  Procedure(s) Performed: LAPAROSCOPIC TUBAL LIGATION with Flishie Clip Left (Bilateral Abdomen)  Patient Location: PACU  Anesthesia Type:General  Level of Consciousness: awake, alert  and oriented  Airway & Oxygen Therapy: Patient Spontanous Breathing and Patient connected to face mask oxygen  Post-op Assessment: Report given to RN  Post vital signs: Reviewed  Last Vitals:  Vitals Value Taken Time  BP    Temp    Pulse 91 11/04/18 1120  Resp 21 11/04/18 1120  SpO2 100 % 11/04/18 1120  Vitals shown include unvalidated device data.  Last Pain:  Vitals:   11/04/18 0921  TempSrc: Oral  PainSc: 0-No pain      Patients Stated Pain Goal: 3 (81/02/54 8628)  Complications: No apparent anesthesia complications

## 2018-11-04 NOTE — Op Note (Signed)
Operative Note   11/04/2018  PRE-OP DIAGNOSIS: Desire for permanent sterilization   POST-OP DIAGNOSIS: Same   SURGEON: Surgeon(s) and Role:    Aletha Halim, MD - Primary  ASSISTANT: None  ANESTHESIA: General and local  PROCEDURE: Laparoscopic left tubal ligation with Flishie Clip   ESTIMATED BLOOD LOSS: 68mL  DRAINS: indwelling foley 31mL UOP   TOTAL IV FLUIDS: per anesthesia note  SPECIMENS: none   VTE PROPHYLAXIS: SCDs to the bilateral lower extremities  ANTIBIOTICS: not indicated  COMPLICATIONS: None  DISPOSITION: PACU - hemodynamically stable.  CONDITION: stable  FINDINGS: Exam under anesthesia revealed an anteverted uterus approximately 8 week size, normal shape, and no adnexal masses; patient sounded to 8cm Laparoscopic survey of the abdomen revealed a grossly normal uterus, left tube, ovaries, liver, and stomach edge; surgically absent right fallopian tube. No intra-abdominal adhesions noted.   PROCEDURE IN DETAIL: The patient was taken to the OR where anesthesia was administed. The patient was positioned in dorsal lithotomy in the [place]. The patient was then examined under anesthesia with the above noted findings. The patient was prepped and draped in the normal sterile fashion and foley catheter was placed. A Graves speculum was placed in the vagina and the anterior lip of the cervix was grasped with a single toothed tenaculum.  A Hulka uterine manipulator was then inserted in the uterus and uterine mobility was found to be satisfactory; the speculum and tenaculum were then removed.  After changing gloves, attention was turned to the patient's abdomen where a 12 mm skin incision was made in the umbilical fold, after injection of local anesthesia. The open technique was used to obtain access to the abdomen and a 61mm port was then placed through the sleeve and the operative laparoscope was introduced into the abdomen with the above noted findings, after  inspection of the entry site and then placing the patient in Trendelenburg. Next a blunt probe was inserted, via the operative laparoscope, and the bilateral adnexa examined with the above noted findings. The left tube was traced out to the fimbriae, with the above noted findings. Next, a Filshie clip was loaded and the placed across the tube approximately one third of the way down the left tube from the uterus. The placement was inspected and complete occlusion of the tube, with the bottom jaw on the outside of the mesosalpinx noted.    All instruments and ports were then removed from the abdomen. The fascia at the umbilical incision was reapproximated with 0 vicryl. The skin was closed with 4-0 vicryl. The Hulka was removed with no bleeding noted from the cervix and all other instrumentation was removed from the vagina.  The foley catheter was removed. The patient tolerated the procedure well. All counts were correct x 2. The patient was transferred to the recovery room awake, alert and breathing independently.   Durene Romans MD Attending Center for [employer] [facility])

## 2018-11-04 NOTE — H&P (Signed)
Obstetrics & Gynecology Surgical H&P   Date of Surgery: 11/04/2018   Primary OBGYN: [facility] Primary Care Provider: Patient, No Pcp Per  Reason for Admission: scheduled BTL  History of Present Illness: Ms. Emma Chambers is a 28 y.o. [phone] (No LMP recorded. Patient has had an injection.), with the above CC. PMHx is significant for h/o 3 laparoscopic surgeries (chole, right salpingectomy, appy), BMI 30s.  Patient has no questions or complaints today.     ROS: A 12-point review of systems was performed and negative, except as stated in the above HPI.  OBGYN History: As per HPI. OB History  Gravida Para Term Preterm AB Living  4 3 2 1 1 3   SAB TAB Ectopic Multiple Live Births  0 0 1 0 3    # Outcome Date GA Lbr Len/2nd Weight Sex Delivery Anes PTL Lv  4 Term 06/21/18 [redacted]w[redacted]d 05:20 / 00:32 2610 g F Vag-Spont EPI  LIV  3 Ectopic 2016          2 Preterm 06/29/12 [redacted]w[redacted]d 10:57 / 00:16 2568 g M Vag-Spont EPI  LIV  1 Term 05/20/11 [redacted]w[redacted]d 22:02 / 00:26 3675 g F Vag-Spont EPI  LIV     Birth Comments: none     Complications: Gestational hypertension    Obstetric Comments  2014: IOL at 51 weeks 2/2 gallstones  2013: GHTN  2016: ectopic pregnancy: Had methotrexate and then had surgery      Past Medical History: Past Medical History:  Diagnosis Date  . Anemia   . Gallstones   . H. pylori infection   . Headache(784.0)    chronic  . History of gestational hypertension 12/01/2017   -baseline labs at [facility] - Start baby ASA    Ref. Range 12/01/2017 09:57 Protein/Creat Ratio Latest Ref Range: 0 - 200 mg/g creat 135   Ref. Range 12/01/2017 09:23 AST Latest Ref Range: 0 - 40 IU/L 14 ALT Latest Ref Range: 0 - 32 IU/L 11   Ref. Range 12/01/2017 09:23 Platelets Latest Ref Range: 150 - 450 x10E3/uL 343   . 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 Kidney stones   . Obesity in pregnancy 03/24/2018  . Ovarian cyst   . Pregnancy induced hypertension    . UTI (lower urinary tract infection)     Past Surgical History: Past Surgical History:  Procedure Laterality Date  . CHOLECYSTECTOMY N/A 07/27/2012   Procedure: LAPAROSCOPIC CHOLECYSTECTOMY WITH INTRAOPERATIVE CHOLANGIOGRAM;  Surgeon: Madilyn Hook, DO;  Location: [facility];  Service: General;  Laterality: N/A;  . DIAGNOSTIC LAPAROSCOPY WITH REMOVAL OF ECTOPIC PREGNANCY N/A 12/27/2014   Procedure: DIAGNOSTIC LAPAROSCOPY, Right Salpingectomy with removal ECTOPIC PREGNANCY;  Surgeon: [name], MD;  Location: [place];  Service: Gynecology;  Laterality: N/A;  . LAPAROSCOPIC APPENDECTOMY N/A 12/09/2012   Procedure: APPENDECTOMY LAPAROSCOPIC;  Surgeon: Gwenyth Ober, MD;  Location: [facility];  Service: General;  Laterality: N/A;    Family History:  Family History  Problem Relation Age of Onset  . Asthma Mother   . Hypertension Mother   . Parkinsonism Mother   . Asthma Brother   . Diabetes Maternal Grandmother   . Hypertension Maternal Grandmother   . Stroke Maternal Grandmother   . Heart disease Maternal Grandmother   . Lupus Maternal Aunt   . Heart disease Maternal Grandfather   . Anesthesia problems Neg Hx   . Malignant hyperthermia Neg Hx   . Pseudochol deficiency Neg Hx   .  Hypotension Neg Hx     Social History:  Social History   Socioeconomic History  . Marital status: Single    Spouse name: Not on file  . Number of children: Not on file  . Years of education: Not on file  . Highest education level: Not on file  Occupational History  . Not on file  Social Needs  . Financial resource strain: Not on file  . Food insecurity    Worry: Never true    Inability: Never true  . Transportation needs    Medical: No    Non-medical: No  Tobacco Use  . Smoking status: Former Smoker    Types: Cigarettes  . Smokeless tobacco: Never Used  . Tobacco comment: 2014  Substance and Sexual Activity  . Alcohol use: Yes    Frequency: Never    Comment: social  . Drug use: Not  Currently    Types: Marijuana  . Sexual activity: Yes    Birth control/protection: None    Comment: last IC- approx one month ago  Lifestyle  . Physical activity    Days per week: Not on file    Minutes per session: Not on file  . Stress: Not on file  Relationships  . Social Herbalist on phone: Not on file    Gets together: Not on file    Attends religious service: Not on file    Active member of club or organization: Not on file    Attends meetings of clubs or organizations: Not on file    Relationship status: Not on file  . Intimate partner violence    Fear of current or ex partner: Not on file    Emotionally abused: Not on file    Physically abused: Not on file    Forced sexual activity: Not on file  Other Topics Concern  . Not on file  Social History Narrative  . Not on file    Allergy: Allergies  Allergen Reactions  . Amoxicillin Nausea And Vomiting and Other (See Comments)    Tics & twitches Has patient had a PCN reaction causing immediate rash, facial/tongue/throat swelling, SOB or lightheadedness with hypotension: Yes Has patient had a PCN reaction causing severe rash involving mucus membranes or skin necrosis: No Has patient had a PCN reaction that required hospitalization No Has patient had a PCN reaction occurring within the last 10 years: Yes If all of the above answers are "NO", then may proceed with Cephalosporin use.  . Contrast Media [Iodinated Diagnostic Agents] Hives  . Dilaudid [Hydromorphone Hcl] Itching    Current Outpatient Medications: No medications prior to admission.     Hospital Medications: Current Facility-Administered Medications  Medication Dose Route Frequency Provider Last Rate Last Dose  . fentaNYL (SUBLIMAZE) injection 50 mcg  50 mcg Intravenous Q5 min PRN Lidia Collum, MD      . lactated ringers infusion   Intravenous Continuous Lidia Collum, MD      . lactated ringers infusion   Intravenous Continuous  Aletha Halim, MD      . midazolam (VERSED) injection 1-2 mg  1-2 mg Intravenous Q5 min PRN Lidia Collum, MD         Physical Exam:   Current Vital Signs 24h Vital Sign Ranges  T 98.8 F (37.1 C) Temp  Avg: 98.8 F (37.1 C)  Min: 98.8 F (37.1 C)  Max: 98.8 F (37.1 C)  BP 113/66 BP  Min: 113/66  Max: 113/66  HR (!) 101 Pulse  Avg: 101  Min: 101  Max: 101  RR 20 Resp  Avg: 20  Min: 20  Max: 20  SaO2 99 % Room Air SpO2  Avg: 99 %  Min: 99 %  Max: 99 %       24 Hour I/O Current Shift I/O  Time Ins Outs No intake/output data recorded. No intake/output data recorded.   Patient Vitals for the past 24 hrs:  BP Temp Temp src Pulse Resp SpO2 Height Weight  11/04/18 0921 113/66 98.8 F (37.1 C) Oral (!) 101 20 99 % 5' (1.524 m) 93 kg    Body mass index is 40.04 kg/m. General appearance: Well nourished, well developed female in no acute distress.  Cardiovascular: S1, S2 normal, no murmur, rub or gallop, regular rate and rhythm Respiratory:  Clear to auscultation bilateral. Normal respiratory effort Abdomen: positive bowel sounds and no masses, hernias; diffusely non tender to palpation, non distended Neuro/Psych:  Normal mood and affect.  Skin:  Warm and dry.  Extremities: no clubbing, cyanosis, or edema.   Laboratory: Negative: COVID swab, UPT  Imaging:  None  Assessment: Ms. Virrueta is a 28 y.o. [phone] doing well  Plan: D/w her re: BTL, including permanency and risk of intraoperative injury and pt amenable to surgery. Can proceed when OR is ready   Durene Romans MD Attending [facility] for [facility])

## 2018-11-04 NOTE — Anesthesia Preprocedure Evaluation (Signed)
Anesthesia Evaluation  Patient identified by MRN, date of birth, ID band Patient awake    Reviewed: Allergy & Precautions, NPO status , Patient's Chart, lab work & pertinent test results  Airway Mallampati: II  TM Distance: >3 FB Neck ROM: Full    Dental no notable dental hx.    Pulmonary neg pulmonary ROS, former smoker,    Pulmonary exam normal breath sounds clear to auscultation       Cardiovascular negative cardio ROS Normal cardiovascular exam Rhythm:Regular Rate:Normal     Neuro/Psych  Headaches, PSYCHIATRIC DISORDERS    GI/Hepatic negative GI ROS, Neg liver ROS,   Endo/Other  Morbid obesity  Renal/GU negative Renal ROS  negative genitourinary   Musculoskeletal negative musculoskeletal ROS (+)   Abdominal   Peds  Hematology  (+) Blood dyscrasia, anemia ,   Anesthesia Other Findings gHTN  Reproductive/Obstetrics                             Anesthesia Physical  Anesthesia Plan  ASA: III  Anesthesia Plan: General   Post-op Pain Management:    Induction: Intravenous  PONV Risk Score and Plan: 2 and Treatment may vary due to age or medical condition, Ondansetron, Dexamethasone and Midazolam  Airway Management Planned: Oral ETT and LMA  Additional Equipment:   Intra-op Plan:   Post-operative Plan: Extubation in OR  Informed Consent: I have reviewed the patients History and Physical, chart, labs and discussed the procedure including the risks, benefits and alternatives for the proposed anesthesia with the patient or authorized representative who has indicated his/her understanding and acceptance.       Plan Discussed with: Anesthesiologist, CRNA and Surgeon  Anesthesia Plan Comments: (  )        Anesthesia Quick Evaluation

## 2018-11-05 ENCOUNTER — Encounter (HOSPITAL_BASED_OUTPATIENT_CLINIC_OR_DEPARTMENT_OTHER): Payer: Self-pay | Admitting: Obstetrics and Gynecology

## 2018-11-11 ENCOUNTER — Emergency Department (HOSPITAL_COMMUNITY)
Admission: EM | Admit: 2018-11-11 | Discharge: 2018-11-11 | Disposition: A | Discharge status: Home / Self Care | Payer: Medicaid Other | Attending: Emergency Medicine | Admitting: Emergency Medicine

## 2018-11-11 ENCOUNTER — Encounter (HOSPITAL_COMMUNITY): Payer: Self-pay | Admitting: Emergency Medicine

## 2018-11-11 ENCOUNTER — Other Ambulatory Visit: Payer: Self-pay

## 2018-11-11 DIAGNOSIS — R112 Nausea with vomiting, unspecified: Secondary | ICD-10-CM | POA: Diagnosis not present

## 2018-11-11 DIAGNOSIS — R509 Fever, unspecified: Secondary | ICD-10-CM | POA: Diagnosis not present

## 2018-11-11 DIAGNOSIS — Z87891 Personal history of nicotine dependence: Secondary | ICD-10-CM | POA: Diagnosis not present

## 2018-11-11 DIAGNOSIS — T8140XA Infection following a procedure, unspecified, initial encounter: Secondary | ICD-10-CM | POA: Insufficient documentation

## 2018-11-11 DIAGNOSIS — Y829 Unspecified medical devices associated with adverse incidents: Secondary | ICD-10-CM | POA: Insufficient documentation

## 2018-11-11 DIAGNOSIS — L089 Local infection of the skin and subcutaneous tissue, unspecified: Secondary | ICD-10-CM

## 2018-11-11 DIAGNOSIS — L539 Erythematous condition, unspecified: Secondary | ICD-10-CM | POA: Diagnosis present

## 2018-11-11 LAB — COMPREHENSIVE METABOLIC PANEL
ALT: 10 U/L (ref 0–44)
AST: 20 U/L (ref 15–41)
Albumin: 3.8 g/dL (ref 3.5–5.0)
Alkaline Phosphatase: 59 U/L (ref 38–126)
Anion gap: 12 (ref 5–15)
BUN: 11 mg/dL (ref 6–20)
CO2: 18 mmol/L — ABNORMAL LOW (ref 22–32)
Calcium: 9 mg/dL (ref 8.9–10.3)
Chloride: 109 mmol/L (ref 98–111)
Creatinine, Ser: 0.64 mg/dL (ref 0.44–1.00)
GFR calc Af Amer: >60 mL/min (ref >60)
GFR calc non Af Amer: >60 mL/min (ref >60)
Glucose, Bld: 83 mg/dL (ref 70–99)
Potassium: 4.1 mmol/L (ref 3.5–5.1)
Sodium: 139 mmol/L (ref 135–145)
Total Bilirubin: 0.8 mg/dL (ref 0.3–1.2)
Total Protein: 6.5 g/dL (ref 6.5–8.1)

## 2018-11-11 LAB — CBC WITH DIFFERENTIAL/PLATELET
Abs Immature Granulocytes: 0.01 10*3/uL (ref 0.00–0.07)
Basophils Absolute: 0 10*3/uL (ref 0.0–0.1)
Basophils Relative: 1 %
Eosinophils Absolute: 0.6 10*3/uL — ABNORMAL HIGH (ref 0.0–0.5)
Eosinophils Relative: 8 %
HCT: 40 % (ref 36.0–46.0)
Hemoglobin: 12.3 g/dL (ref 12.0–15.0)
Immature Granulocytes: 0 %
Lymphocytes Relative: 39 %
Lymphs Abs: 2.8 10*3/uL (ref 0.7–4.0)
MCH: 27.1 pg (ref 26.0–34.0)
MCHC: 30.8 g/dL (ref 30.0–36.0)
MCV: 88.1 fL (ref 80.0–100.0)
Monocytes Absolute: 0.4 10*3/uL (ref 0.1–1.0)
Monocytes Relative: 6 %
Neutro Abs: 3.4 10*3/uL (ref 1.7–7.7)
Neutrophils Relative %: 46 %
Platelets: 325 10*3/uL (ref 150–400)
RBC: 4.54 MIL/uL (ref 3.87–5.11)
RDW: 14.6 % (ref 11.5–15.5)
WBC: 7.1 10*3/uL (ref 4.0–10.5)
nRBC: 0 % (ref 0.0–0.2)

## 2018-11-11 LAB — URINALYSIS, ROUTINE W REFLEX MICROSCOPIC
Bilirubin Urine: NEGATIVE
Glucose, UA: NEGATIVE mg/dL
Hgb urine dipstick: NEGATIVE
Ketones, ur: NEGATIVE mg/dL
Leukocytes,Ua: NEGATIVE
Nitrite: NEGATIVE
Protein, ur: NEGATIVE mg/dL
Specific Gravity, Urine: 1.03 (ref 1.005–1.030)
pH: 5 (ref 5.0–8.0)

## 2018-11-11 LAB — I-STAT BETA HCG BLOOD, ED (MC, WL, AP ONLY): I-stat hCG, quantitative: <5 m[IU]/mL (ref <5)

## 2018-11-11 LAB — LIPASE, BLOOD: Lipase: 28 U/L (ref 11–51)

## 2018-11-11 MED ORDER — CEPHALEXIN 500 MG PO CAPS: 500.0000 mg | ORAL_CAPSULE | Freq: Three times a day (TID) | ORAL | 0 refills | Status: DC

## 2018-11-11 NOTE — ED Notes (Signed)
Pt removed IV and was in the process of leaving as I walked into the room. I was able to review discharge instructions with patient. Pt was unable to wait to sign d/c.  Pt discharged from ED.

## 2018-11-11 NOTE — ED Notes (Signed)
Urine culture sent with urinalysis.  

## 2018-11-11 NOTE — ED Triage Notes (Signed)
Patient reports tubal ligation on Tuesday. Patient states she has had fever, emesis and having itching at incision site approx 2 days ago. Sept 10th patient has post op appt. Reports foul smell drainage from site.

## 2018-11-11 NOTE — ED Provider Notes (Signed)
[facility] EMERGENCY DEPARTMENT Provider Note   CSN: [id number] Arrival date & time: 11/11/18  [id number]     History   Chief Complaint Chief Complaint  Patient presents with  . Post-op Problem    HPI Emma Chambers is a 28 y.o. female.     HPI Patient had tubal ligation performed 8 days ago.  Of the past 2 days she is developed redness and swelling to the periumbilical operative site.  She is also had subjective fevers and chills with nausea.  Denies any vaginal symptoms.  No urinary symptoms.  Has follow-up appointment to see her OB/GYN on 9/10. Past Medical History:  Diagnosis Date  . Anemia   . Gallstones   . H. pylori infection   . Headache(784.0)    chronic  . History of gestational hypertension 12/01/2017   -baseline labs at [facility] - Start baby ASA    Ref. Range 12/01/2017 09:57 Protein/Creat Ratio Latest Ref Range: 0 - 200 mg/g creat 135   Ref. Range 12/01/2017 09:23 AST Latest Ref Range: 0 - 40 IU/L 14 ALT Latest Ref Range: 0 - 32 IU/L 11   Ref. Range 12/01/2017 09:23 Platelets Latest Ref Range: 150 - 450 x10E3/uL 343   . 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 Kidney stones   . Obesity in pregnancy 03/24/2018  . Ovarian cyst   . Pregnancy induced hypertension   . UTI (lower urinary tract infection)     Patient Active Problem List   Diagnosis Date Noted  . Mild tetrahydrocannabinol (THC) abuse 04/16/2018  . 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 Generalized headaches 03/27/2011  . H. pylori infection 02/13/2011    Past Surgical History:  Procedure Laterality Date  . CHOLECYSTECTOMY N/A 07/27/2012   Procedure: LAPAROSCOPIC CHOLECYSTECTOMY WITH INTRAOPERATIVE CHOLANGIOGRAM;  Surgeon: Madilyn Hook, DO;  Location: [facility];  Service: General;  Laterality: N/A;  . DIAGNOSTIC LAPAROSCOPY WITH REMOVAL OF ECTOPIC PREGNANCY N/A 12/27/2014   Procedure: DIAGNOSTIC LAPAROSCOPY, Right Salpingectomy with removal ECTOPIC PREGNANCY;   Surgeon: [name], MD;  Location: [facility];  Service: Gynecology;  Laterality: N/A;  . LAPAROSCOPIC APPENDECTOMY N/A 12/09/2012   Procedure: APPENDECTOMY LAPAROSCOPIC;  Surgeon: Gwenyth Ober, MD;  Location: [facility];  Service: General;  Laterality: N/A;  . LAPAROSCOPIC TUBAL LIGATION Bilateral 11/04/2018   Procedure: LAPAROSCOPIC TUBAL LIGATION with Flishie Clip Left;  Surgeon: Aletha Halim, MD;  Location: [place];  Service: Gynecology;  Laterality: Bilateral;     OB History    Gravida  4   Para  3   Term  2   Preterm  1   AB  1   Living  3     SAB  0   TAB  0   Ectopic  1   Multiple  0   Live Births  3        Obstetric Comments  2014: IOL at 30 weeks 2/2 gallstones 2013: GHTN 2016: ectopic pregnancy: Had methotrexate and then had surgery          Home Medications    Prior to Admission medications   Medication Sig Start Date End Date Taking? Authorizing Provider  ibuprofen (ADVIL) 600 MG tablet Take 1 tablet (600 mg total) by mouth every 6 (six) hours as needed. 11/04/18  Yes Aletha Halim, MD  oxyCODONE-acetaminophen (PERCOCET/ROXICET) 5-325 MG tablet Take 1-2 tablets by mouth every 6 (six) hours as needed. 11/04/18  Yes Pickens,  Eduard Clos, MD  polyethylene glycol (MIRALAX / GLYCOLAX) 17 g packet Take 17 g by mouth daily as needed. 11/04/18  Yes Aletha Halim, MD  cephALEXin (KEFLEX) 500 MG capsule Take 1 capsule (500 mg total) by mouth 3 (three) times daily. 11/11/18   Julianne Rice, MD    Family History Family History  Problem Relation Age of Onset  . Asthma Mother   . Hypertension Mother   . Parkinsonism Mother   . Asthma Brother   . Diabetes Maternal Grandmother   . Hypertension Maternal Grandmother   . Stroke Maternal Grandmother   . Heart disease Maternal Grandmother   . Lupus Maternal Aunt   . Heart disease Maternal Grandfather   . Anesthesia problems Neg Hx   . Malignant hyperthermia Neg Hx   . Pseudochol deficiency  Neg Hx   . Hypotension Neg Hx     Social History Social History   Tobacco Use  . Smoking status: Former Smoker    Types: Cigarettes  . Smokeless tobacco: Never Used  . Tobacco comment: 2014  Substance Use Topics  . Alcohol use: Yes    Frequency: Never    Comment: social  . Drug use: Not Currently    Types: Marijuana     Allergies   Amoxicillin, Contrast media [iodinated diagnostic agents], and Dilaudid [hydromorphone hcl]   Review of Systems Review of Systems  Constitutional: Positive for chills and fever. Negative for fatigue.  Respiratory: Negative for cough and shortness of breath.   Cardiovascular: Negative for chest pain.  Gastrointestinal: Positive for abdominal pain, nausea and vomiting. Negative for constipation and diarrhea.  Genitourinary: Negative for dysuria, flank pain, frequency, hematuria, pelvic pain, vaginal bleeding and vaginal discharge.  Musculoskeletal: Negative for back pain, myalgias and neck pain.  Skin: Positive for color change. Negative for rash and wound.  Neurological: Negative for dizziness, weakness, light-headedness, numbness and headaches.  All other systems reviewed and are negative.    Physical Exam Updated Vital Signs BP 112/70   Pulse 77   Temp 99 F (37.2 C) (Oral)   Resp 18   Ht 5' (1.524 m)   Wt 93 kg   SpO2 100%   BMI 40.04 kg/m   Physical Exam Vitals signs and nursing note reviewed.  Constitutional:      Appearance: Normal appearance. She is well-developed.  HENT:     Head: Normocephalic and atraumatic.     Mouth/Throat:     Mouth: Mucous membranes are moist.  Eyes:     Pupils: Pupils are equal, round, and reactive to light.  Neck:     Musculoskeletal: Normal range of motion and neck supple.  Cardiovascular:     Rate and Rhythm: Normal rate and regular rhythm.     Heart sounds: No murmur. No friction rub. No gallop.   Pulmonary:     Effort: Pulmonary effort is normal.     Breath sounds: Normal breath  sounds.  Abdominal:     General: Bowel sounds are normal.     Palpations: Abdomen is soft.     Tenderness: There is abdominal tenderness. There is no guarding or rebound.     Comments: Patient with mild erythema and tenderness to palpation.  To periumbilical surgical scar.  There is no drainage or fluctuant masses.  No suprapubic tenderness to palpation.  Musculoskeletal: Normal range of motion.        General: No swelling, tenderness, deformity or signs of injury.     Right lower leg: No edema.  Left lower leg: No edema.  Skin:    General: Skin is warm and dry.     Findings: No erythema or rash.  Neurological:     General: No focal deficit present.     Mental Status: She is alert and oriented to person, place, and time.  Psychiatric:        Behavior: Behavior normal.      ED Treatments / Results  Labs (all labs ordered are listed, but only abnormal results are displayed) Labs Reviewed  CBC WITH DIFFERENTIAL/PLATELET - Abnormal; Notable for the following components:      Result Value   Eosinophils Absolute 0.6 (*)    All other components within normal limits  COMPREHENSIVE METABOLIC PANEL - Abnormal; Notable for the following components:   CO2 18 (*)    All other components within normal limits  URINALYSIS, ROUTINE W REFLEX MICROSCOPIC - Abnormal; Notable for the following components:   APPearance HAZY (*)    All other components within normal limits  LIPASE, BLOOD  I-STAT BETA HCG BLOOD, ED ([facility], [facility], [facility] ONLY)    EKG None  Radiology No results found.  Procedures Procedures (including critical care time)  Medications Ordered in ED Medications - No data to display   Initial Impression / Assessment and Plan / ED Course  I have reviewed the triage vital signs and the nursing notes.  Pertinent labs & imaging results that were available during my care of the patient were reviewed by me and considered in my medical decision making (see chart for details).        Laboratory work-up without significant abnormality.  Suspect localized wound infection.  Will treat with Keflex.  Patient will follow-up with her OB/GYN.  Strict return precautions have been given.   Final Clinical Impressions(s) / ED Diagnoses   Final diagnoses:  Wound infection    ED Discharge Orders         Ordered    cephALEXin (KEFLEX) 500 MG capsule  3 times daily     11/11/18 1025           Julianne Rice, MD 11/11/18 1026

## 2018-11-16 ENCOUNTER — Ambulatory Visit (INDEPENDENT_AMBULATORY_CARE_PROVIDER_SITE_OTHER): Payer: Medicaid Other | Admitting: Obstetrics and Gynecology

## 2018-11-16 ENCOUNTER — Telehealth (INDEPENDENT_AMBULATORY_CARE_PROVIDER_SITE_OTHER): Payer: Medicaid Other | Admitting: General Practice

## 2018-11-16 ENCOUNTER — Encounter: Payer: Self-pay | Admitting: Obstetrics and Gynecology

## 2018-11-16 ENCOUNTER — Other Ambulatory Visit: Payer: Self-pay

## 2018-11-16 VITALS — BP 118/77 | HR 76 | Temp 99.2°F | Ht 60.0 in | Wt 205.8 lb

## 2018-11-16 DIAGNOSIS — G8918 Other acute postprocedural pain: Secondary | ICD-10-CM

## 2018-11-16 DIAGNOSIS — T8149XA Infection following a procedure, other surgical site, initial encounter: Secondary | ICD-10-CM

## 2018-11-16 MED ORDER — PROCHLORPERAZINE MALEATE 10 MG PO TABS: 10.0000 mg | ORAL_TABLET | Freq: Four times a day (QID) | ORAL | 0 refills | Status: DC | PRN

## 2018-11-16 MED ORDER — OXYCODONE-ACETAMINOPHEN 5-325 MG PO TABS: 1.0000 | ORAL_TABLET | Freq: Four times a day (QID) | ORAL | 0 refills | Status: DC | PRN

## 2018-11-16 NOTE — Progress Notes (Signed)
Obstetrics and Gynecology Visit Return Patient Evaluation  Appointment Date: 11/16/2018  Primary Care Provider: Patient, No Pcp Per  OBGYN Clinic: [facility]  Chief Complaint: add on incision check  History of Present Illness:  Emma Chambers is a 28 y.o. s/p 8/19 l/s tubal ligation that was uncomplicated and was discharged from the pacu.  She went to the ED on 8/26 for pain, redness at the umbilicus and prescribed her keflex for presumed SSI. Negative cbc, beta hcg.   She says her pain began on 11/11/2018 and says it is located right above her belly button. It has been getting worse since then and is now a 8/10. She says it feels burning and achy, like a knot in her stomach. She has been taking ibuprofen but it is not helping with the pain. She says the incision has also been leaking a clear whitish yellow fluid. She has had nausea and vomiting 4 times per day. She feels like she has been having fevers, stating she gets really hot and then feels really cold. She was seen on 11/11/2018 in the ED and given antibiotics for a presumed infection, but says she has not noticed any improvement since beginning this medication. She says the antibiotic makes the nausea worse. No diarrhea or constipation. She states that nausea vomiting went away after her delivery but came back after surgery similar to when she was pregnant.   Review of Systems: as noted in the History of Present Illness. Medications:  [name] had no medications administered during this visit. Current Outpatient Medications  Medication Sig Dispense Refill  . cephALEXin (KEFLEX) 500 MG capsule Take 1 capsule (500 mg total) by mouth 3 (three) times daily. 21 capsule 0  . ibuprofen (ADVIL) 600 MG tablet Take 1 tablet (600 mg total) by mouth every 6 (six) hours as needed. 30 tablet 0  . oxyCODONE-acetaminophen (PERCOCET/ROXICET) 5-325 MG tablet Take 1-2 tablets by mouth every 6 (six) hours as needed. 5  tablet 0  . polyethylene glycol (MIRALAX / GLYCOLAX) 17 g packet Take 17 g by mouth daily as needed. 14 each 0   No current facility-administered medications for this visit.     Allergies: is allergic to amoxicillin; contrast media [iodinated diagnostic agents]; and dilaudid [hydromorphone hcl].  Physical Exam:  BP 118/77   Pulse 76   Temp 99.2 F (37.3 C)   Ht 5' (1.524 m)   Wt 205 lb 12.8 oz (93.4 kg)   BMI 40.19 kg/m  Body mass index is 40.19 kg/m. General appearance: Well nourished, well developed female in no acute distress.  Abdomen: +BS, soft, nttp, nd. C/d/i umbilical incision, no fluid and none with expression, nttp, normal skin Neuro/Psych:  Normal mood and affect.     Assessment: pt doing well  Plan: No e/o continued SSI and no concern for ileus, SBO. ?s/s due to keflex. Wound is healed so pt told to d/c keflex. Will give a percocet 5/325 #5 and compazine. Pt told if s/s aren't improved by Thursday to call and we can do a CT scan  RTC: 1-2wk incision.   Durene Romans MD Attending Center for [employer] [facility])

## 2018-11-16 NOTE — Telephone Encounter (Signed)
Patient called into front office reporting concern of wound infection. Called patient back and she reports nausea/vomiting with fever/chills continuing since last week. She also reports burning/itching of incision as well as a pus looking discharge with odor. Patient had BTL on 8/19 and went to ER on 8/26 for concerns of wound infection then. Patient states she feels like it has gotten worse and she has been taking the antibiotics but feels like it isn't helping. Discussed with Dr Ilda Basset who states patient can come in this morning for evaluation on his schedule. Discussed with patient to go ahead and leave her house now and he will see her. Patient verbalized understanding & had no questions.

## 2018-11-24 ENCOUNTER — Telehealth: Payer: Self-pay | Admitting: Obstetrics and Gynecology

## 2018-11-24 NOTE — Telephone Encounter (Signed)
Attempted to call patient about her appointment on 9/9 @ 3:35. No answer left voicemail instructing patient to wear a face mask and no visitors are allowed. Patient instructed not to attend the appointment if she has any symptoms. Symptom list and office number left.

## 2018-11-25 ENCOUNTER — Ambulatory Visit: Payer: Medicaid Other | Admitting: Obstetrics and Gynecology

## 2018-11-25 ENCOUNTER — Encounter: Payer: Self-pay | Admitting: Obstetrics and Gynecology

## 2018-11-26 ENCOUNTER — Encounter: Payer: Medicaid Other | Admitting: Obstetrics and Gynecology

## 2018-11-26 NOTE — Progress Notes (Signed)
Patient did not keep her GYN follow up appointment for 11-25-2018.  Durene Romans MD Attending Center for [employer] [facility])

## 2018-12-09 ENCOUNTER — Ambulatory Visit: Payer: Medicaid Other

## 2018-12-14 ENCOUNTER — Ambulatory Visit: Payer: Medicaid Other

## 2019-01-08 ENCOUNTER — Other Ambulatory Visit: Payer: Self-pay

## 2019-01-08 ENCOUNTER — Encounter (HOSPITAL_COMMUNITY): Payer: Self-pay | Admitting: Emergency Medicine

## 2019-01-08 ENCOUNTER — Emergency Department (HOSPITAL_COMMUNITY)
Admission: EM | Admit: 2019-01-08 | Discharge: 2019-01-08 | Disposition: A | Discharge status: Home / Self Care | Payer: Medicaid Other | Attending: Emergency Medicine | Admitting: Emergency Medicine

## 2019-01-08 DIAGNOSIS — M549 Dorsalgia, unspecified: Secondary | ICD-10-CM | POA: Diagnosis not present

## 2019-01-08 DIAGNOSIS — Z5321 Procedure and treatment not carried out due to patient leaving prior to being seen by health care provider: Secondary | ICD-10-CM | POA: Diagnosis not present

## 2019-01-08 LAB — CBC
HCT: 39 % (ref 36.0–46.0)
Hemoglobin: 12.7 g/dL (ref 12.0–15.0)
MCH: 28.3 pg (ref 26.0–34.0)
MCHC: 32.6 g/dL (ref 30.0–36.0)
MCV: 87.1 fL (ref 80.0–100.0)
Platelets: 328 10*3/uL (ref 150–400)
RBC: 4.48 MIL/uL (ref 3.87–5.11)
RDW: 13.7 % (ref 11.5–15.5)
WBC: 9.2 10*3/uL (ref 4.0–10.5)
nRBC: 0 % (ref 0.0–0.2)

## 2019-01-08 LAB — URINALYSIS, ROUTINE W REFLEX MICROSCOPIC
Bilirubin Urine: NEGATIVE
Glucose, UA: NEGATIVE mg/dL
Hgb urine dipstick: NEGATIVE
Ketones, ur: NEGATIVE mg/dL
Leukocytes,Ua: NEGATIVE
Nitrite: NEGATIVE
Protein, ur: NEGATIVE mg/dL
Specific Gravity, Urine: 1.02 (ref 1.005–1.030)
pH: 6 (ref 5.0–8.0)

## 2019-01-08 LAB — COMPREHENSIVE METABOLIC PANEL
ALT: 10 U/L (ref 0–44)
AST: 16 U/L (ref 15–41)
Albumin: 3.8 g/dL (ref 3.5–5.0)
Alkaline Phosphatase: 63 U/L (ref 38–126)
Anion gap: 9 (ref 5–15)
BUN: 8 mg/dL (ref 6–20)
CO2: 21 mmol/L — ABNORMAL LOW (ref 22–32)
Calcium: 9 mg/dL (ref 8.9–10.3)
Chloride: 109 mmol/L (ref 98–111)
Creatinine, Ser: 0.6 mg/dL (ref 0.44–1.00)
GFR calc Af Amer: >60 mL/min (ref >60)
GFR calc non Af Amer: >60 mL/min (ref >60)
Glucose, Bld: 81 mg/dL (ref 70–99)
Potassium: 3.6 mmol/L (ref 3.5–5.1)
Sodium: 139 mmol/L (ref 135–145)
Total Bilirubin: 0.3 mg/dL (ref 0.3–1.2)
Total Protein: 6.5 g/dL (ref 6.5–8.1)

## 2019-01-08 LAB — I-STAT BETA HCG BLOOD, ED (MC, WL, AP ONLY): I-stat hCG, quantitative: <5 m[IU]/mL (ref <5)

## 2019-01-08 MED ORDER — SODIUM CHLORIDE 0.9% FLUSH: 3.0000 mL | Freq: Once | INTRAVENOUS | Status: DC

## 2019-01-08 NOTE — ED Triage Notes (Signed)
Pt reports a tubal ligation in august, states she has not had her menstrual cycle since then, started having lower back and abd pain 1 week ago, saw obgyn Wednesday, had urinalysis which was negative and was given percocets which she states have not helped. States they set her up an u/s to check for fibroids but u/s is not until 11/4 and she reports pain has gotten worse. Denies any vaginal bleeding.

## 2019-01-12 IMAGING — US US OB TRANSVAGINAL
1 series · 15 of 28 positions shown · non-contrast
Comparison: 01/08/2016 pelvic ultrasound

CLINICAL DATA: Abdominal pain for 3 weeks. Quantitative beta HCG is
pending. By LMP of 09/16/2017, patient is 4 weeks 5 days. EDC by LMP
is 06/23/2018.

EXAM:
OBSTETRIC <14 WK US AND TRANSVAGINAL OB US
TECHNIQUE: Both transabdominal and transvaginal ultrasound examinations were
performed for complete evaluation of the gestation as well as the
maternal uterus, adnexal regions, and pelvic cul-de-sac.
Transvaginal technique was performed to assess early pregnancy.

[Series 1: us ob transvaginal · 15 of 34 slices shown]
[im 1/34]
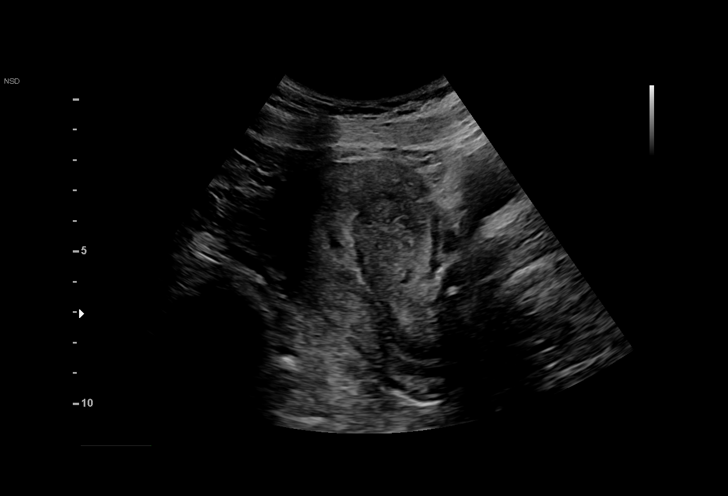
[im 3/34]
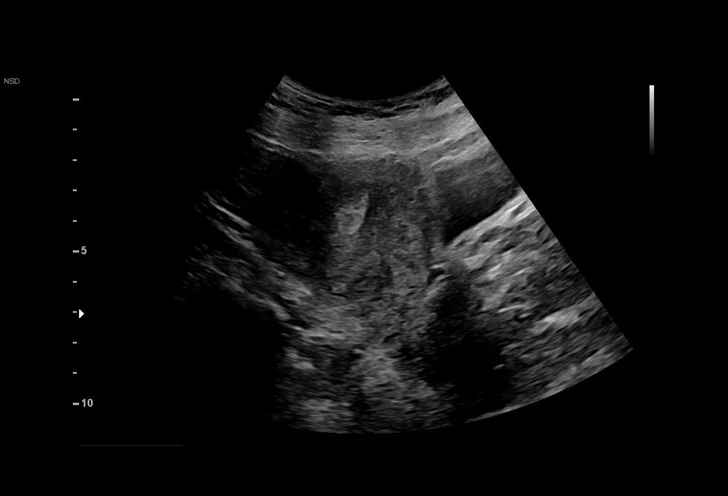
[im 5/34]
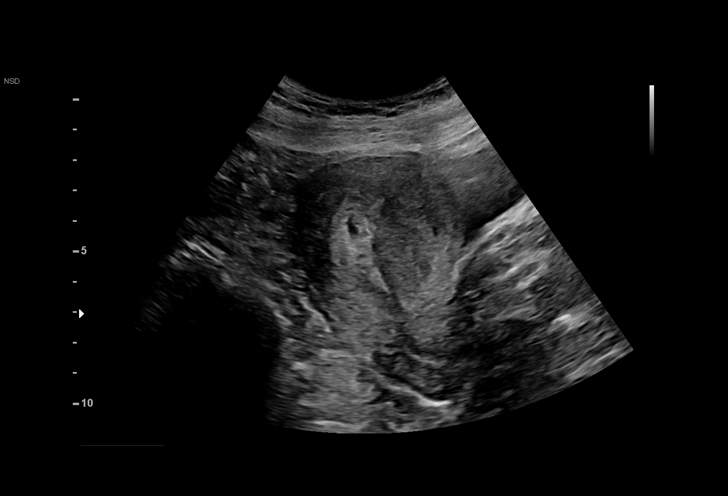
[im 8/34]
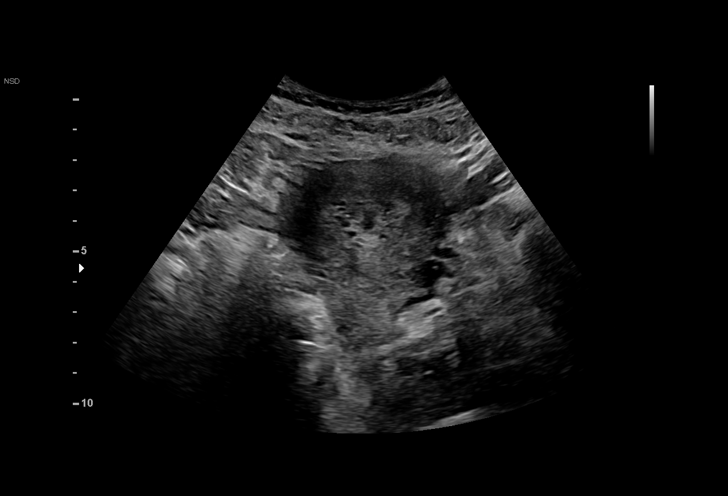
[im 10/34]
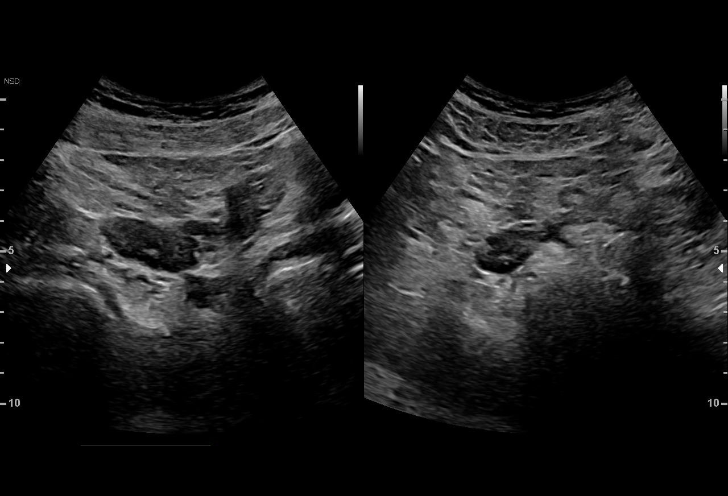
[im 13/34]
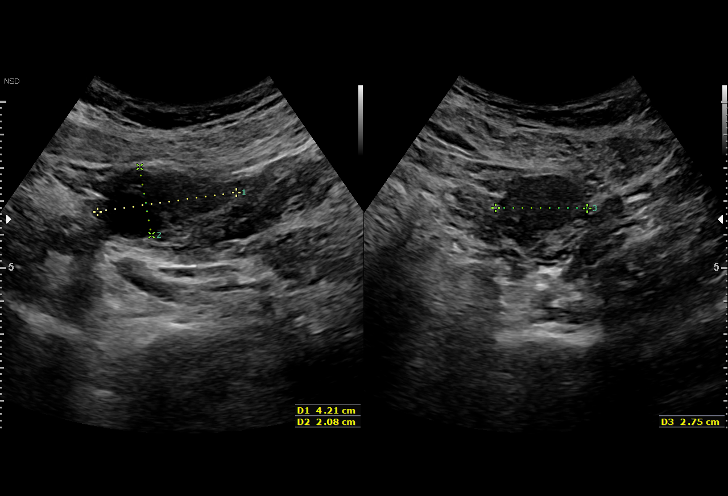
[im 15/34]
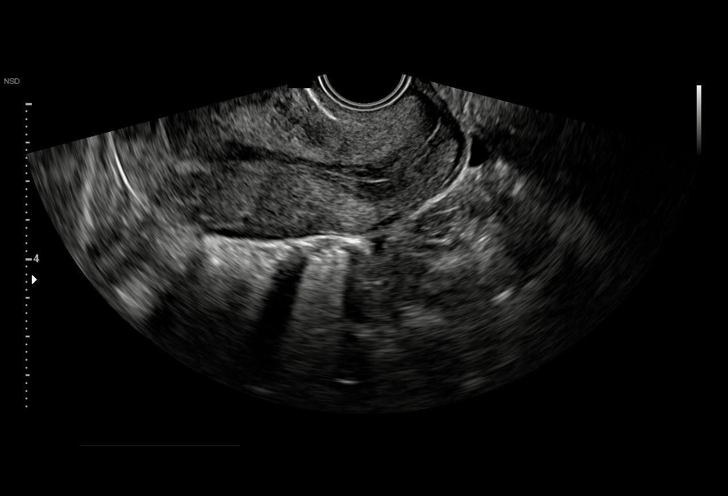
[im 18/34]
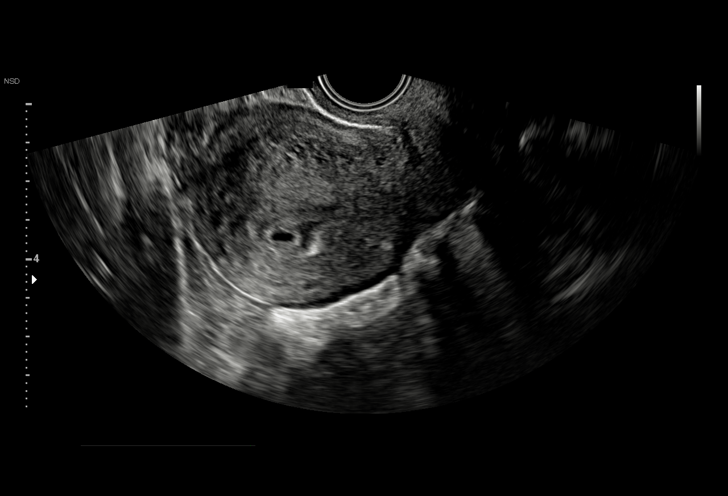
[im 19/34]
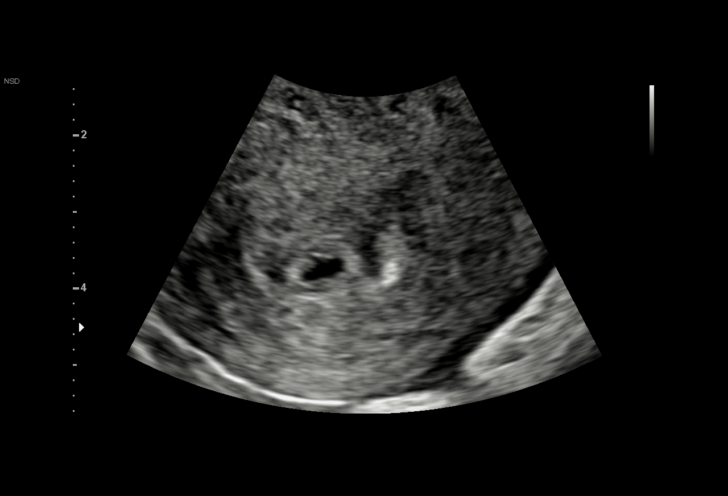
[im 21/34]
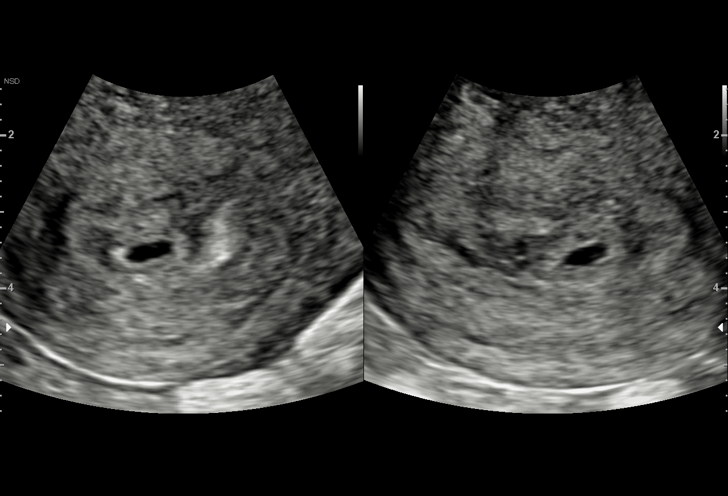
[im 24/34]
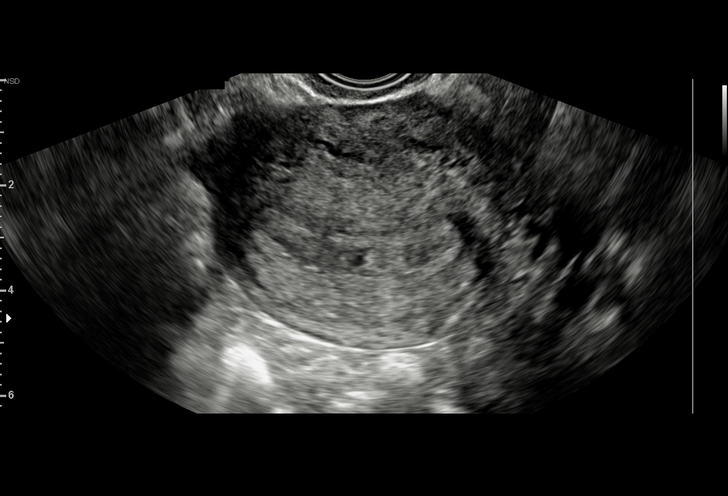
[im 26/34]
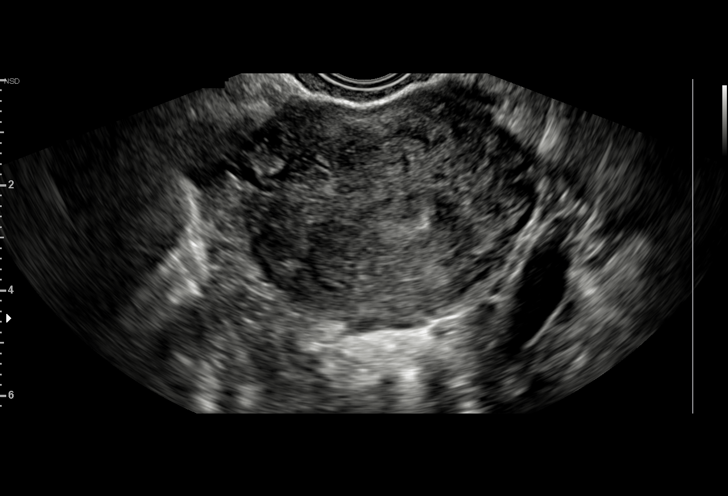
[im 29/34]
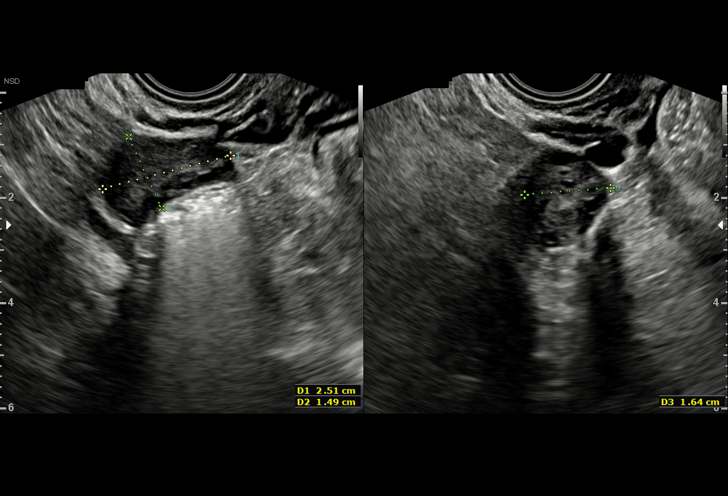
[im 31/34]
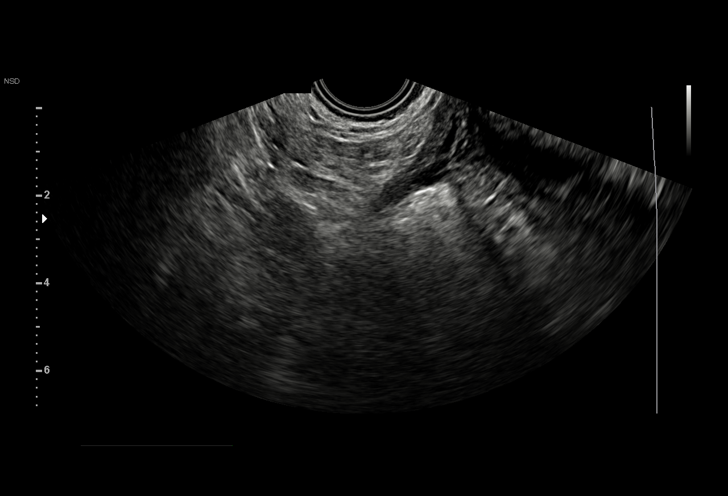
[im 34/34]
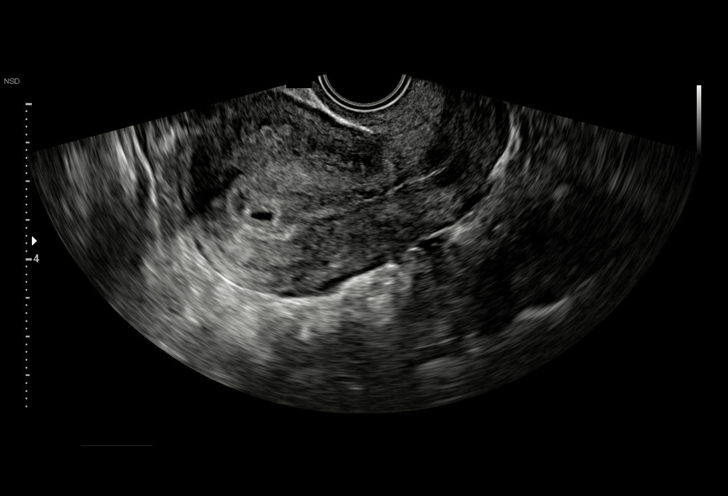

[15 of 28 positions shown; findings below may reference images not displayed]

FINDINGS: Intrauterine gestational sac: Single

Yolk sac:  Not Visualized.

Embryo:  Not Visualized.

Cardiac Activity: Not Visualized.

Heart Rate: Absent bpm

MSD: 4.4 mm   5 w   1 d

Subchorionic hemorrhage:  None visualized.

Maternal uterus/adnexae: Normal in appearance. No free pelvic fluid.
IMPRESSION: Intrauterine gestational sac is present. Embryo and yolk sac are not
yet visualized. Follow-up ultrasound is recommended in 14 or more
days to document presence of fetal pole and for dating purposes.

## 2019-07-14 IMAGING — US US MFM OB FOLLOW-UP
1 series · 13 of 28 positions shown · non-contrast
Comparison: none

[Series 1: us mfm ob follow-up · 13 of 31 slices shown]
[im 2/31]
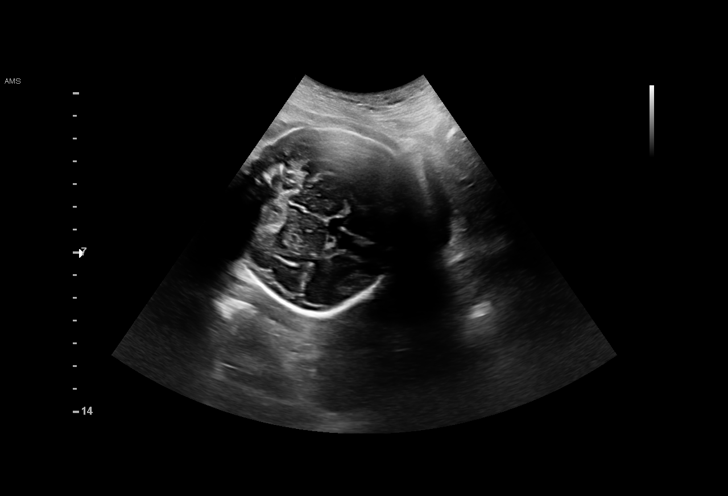
[im 4/31]
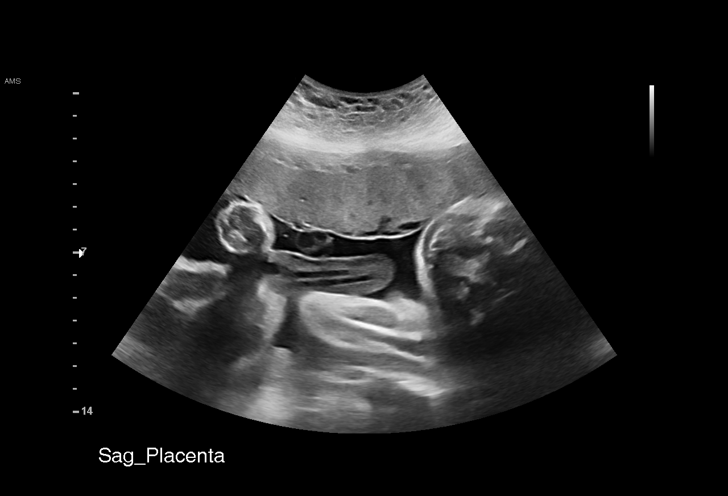
[im 6/31]
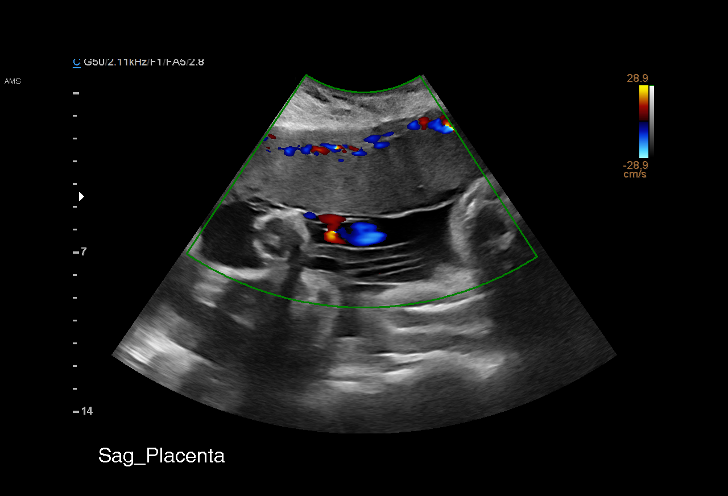
[im 8/31]
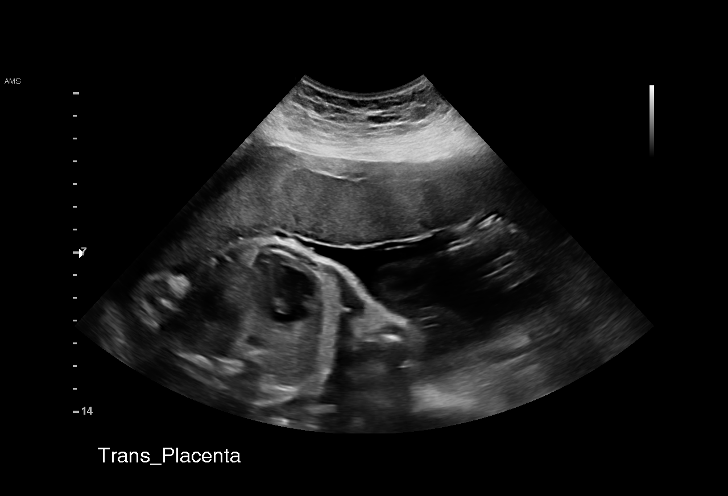
[im 11/31]
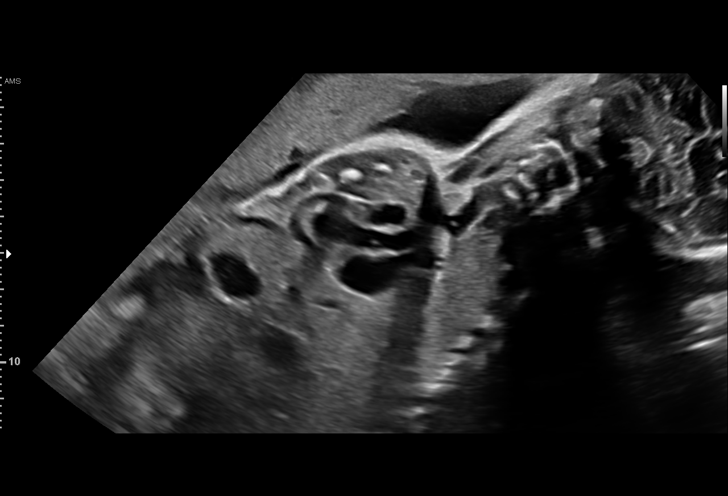
[im 13/31]
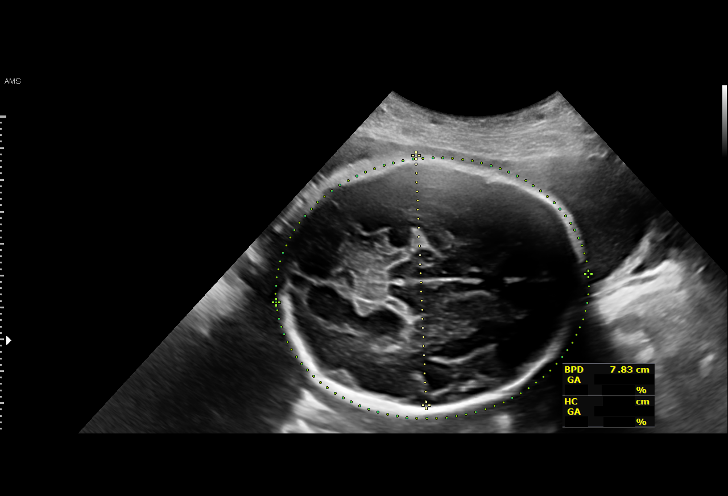
[im 16/31]
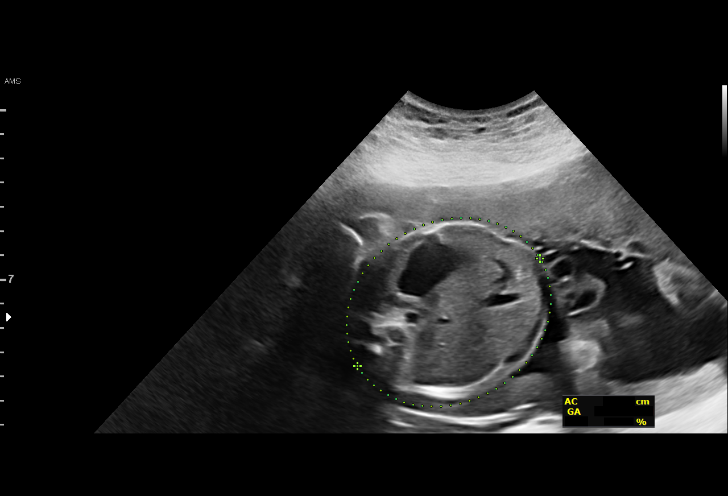
[im 18/31]
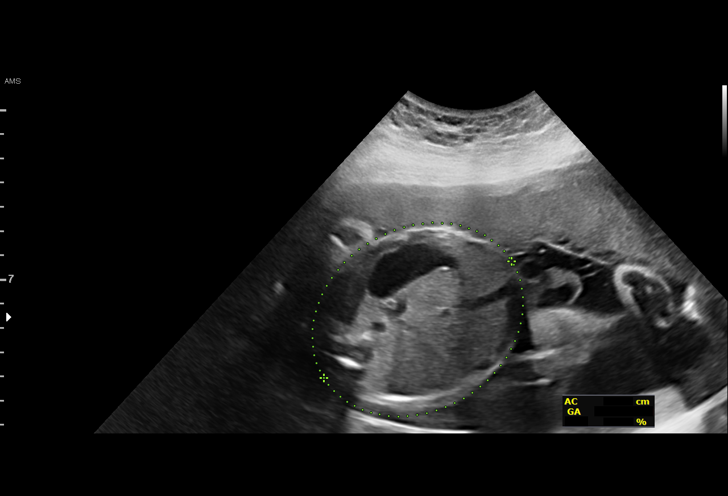
[im 21/31]
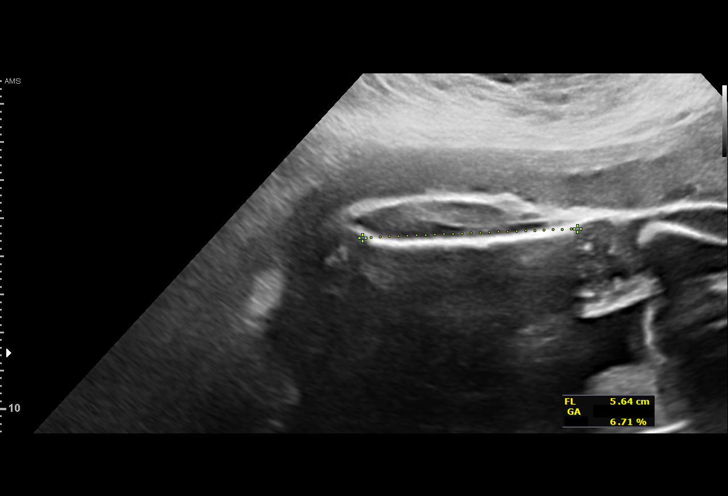
[im 23/31]
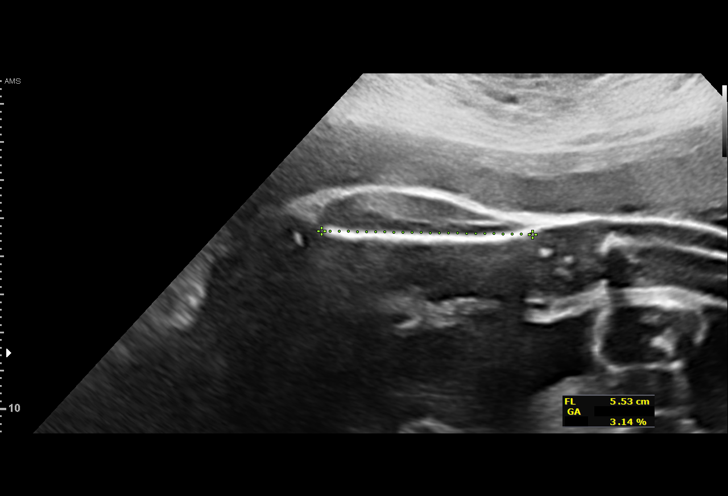
[im 25/31]
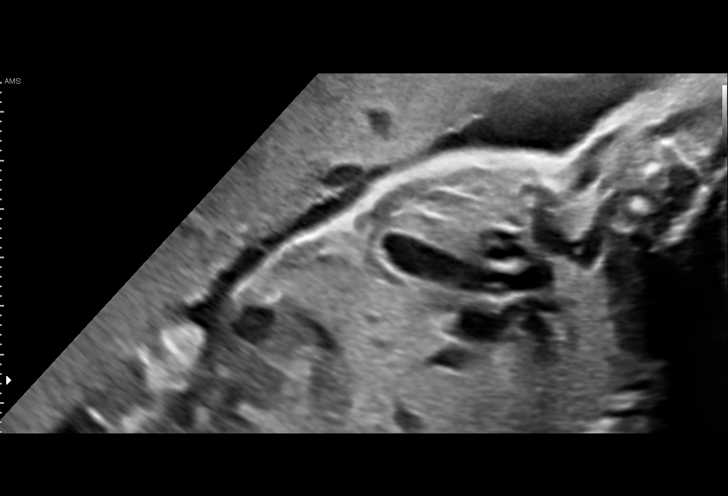
[im 27/31]
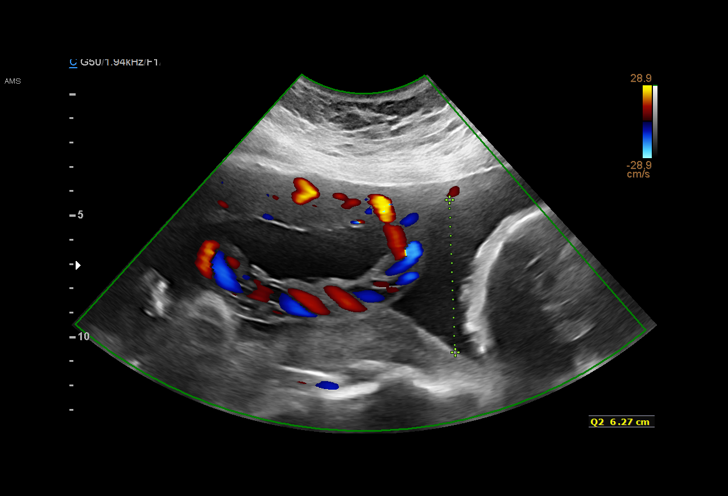
[im 29/31]
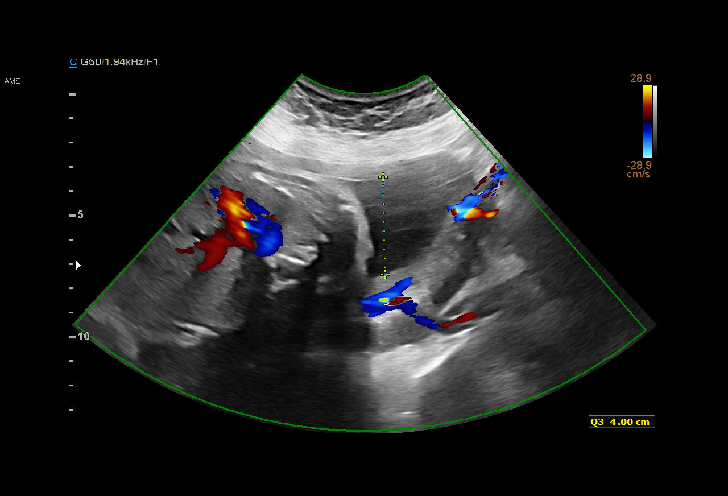

[13 of 28 positions shown; findings below may reference images not displayed]

[REDACTED]. [HOSPITAL],
                   [facility] CNM

                                                       MARCOREL NICOLA
 ----------------------------------------------------------------------

 ----------------------------------------------------------------------
Indications

  Obesity complicating pregnancy, third
  trimester
  31 weeks gestation of pregnancy
  Poor obstetric history: Previous
  preeclampsia / eclampsia/gestational HTN
  Poor obstetric history (prior pre-term
  delivery)-36w 4d
 ----------------------------------------------------------------------
Vital Signs

                                                Height:        5'2"
Fetal Evaluation

 Num Of Fetuses:         1
 Fetal Heart Rate(bpm):  154
 Cardiac Activity:       Observed
 Presentation:           Cephalic
 Placenta:               Anterior

 Amniotic Fluid
 AFI FV:      Within normal limits

 AFI Sum(cm)     %Tile       Largest Pocket(cm)
 21.3            83

 RUQ(cm)       RLQ(cm)       LUQ(cm)        LLQ(cm)
 2.17          8.86          6.27           4
Biometry

 BPD:      78.2  mm     G. Age:  31w 3d         51  %    CI:        75.68   %    70 - 86
                                                         FL/HC:      19.5   %    19.3 -
 HC:       285   mm     G. Age:  31w 2d         22  %    HC/AC:      1.09        0.96 -
 AC:      261.8  mm     G. Age:  30w 2d         27  %    FL/BPD:     71.1   %    71 - 87
 FL:       55.6  mm     G. Age:  29w 2d          5  %    FL/AC:      21.2   %    20 - 24

 Est. FW:    3022  gm      3 lb 6 oz     38  %
OB History

 Gravidity:    4         Term:   1        Prem:   1
 Ectopic:      1        Living:  2
Gestational Age

 LMP:           31w 0d        Date:  09/15/17                 EDD:   06/22/18
 U/S Today:     30w 4d                                        EDD:   06/25/18
 Best:          31w 0d     Det. By:  LMP  (09/15/17)          EDD:   06/22/18
Anatomy

 Cranium:               Appears normal         LVOT:                   Appears normal
 Cavum:                 Appears normal         Aortic Arch:            Previously seen
 Ventricles:            Appears normal         Ductal Arch:            Previously seen
 Choroid Plexus:        Previously seen        Diaphragm:              Appears normal
 Cerebellum:            Previously seen        Stomach:                Appears normal, left
                                                                       sided
 Posterior Fossa:       Previously seen        Abdomen:                Appears normal
 Nuchal Fold:           Previously seen        Abdominal Wall:         Previously seen
 Face:                  Orbits and profile     Cord Vessels:           Previously seen
                        previously seen
 Lips:                  Appears normal         Kidneys:                Appear normal
 Palate:                Not well visualized    Bladder:                Appears normal
 Thoracic:              Appears normal         Spine:                  Previously seen
 Heart:                 Appears normal         Upper Extremities:      Previously seen
                        (4CH, axis, and situs
 RVOT:                  Previously seen        Lower Extremities:      Previously seen

 Other:  Female gender Heels, 5th digit, Nasal bone, and Open hands
         previously visualized.
Cervix Uterus Adnexa

 Cervix
 Not visualized (advanced GA >91wks)
Impression

 Maternal Obesity.
 Fetal growth is appropriate for gestational age. Amniotic fluid
 is normal and good fetal activity is seen.
 Patient reports she could not tolerate oral glucose to screen
 for gestational diabetes.

 Fasting blood glucose and post-breakfast sample may be
 obtained to screen for GDM.
Recommendations

 -An appointment was made for her to return in 6 weeks for
 fetal growth assessment.
                 Magnil, Mimmis

## 2019-08-17 ENCOUNTER — Ambulatory Visit (HOSPITAL_COMMUNITY)
Admission: EM | Admit: 2019-08-17 | Discharge: 2019-08-17 | Disposition: A | Discharge status: Home / Self Care | Payer: Medicaid Other

## 2019-08-17 ENCOUNTER — Emergency Department (HOSPITAL_COMMUNITY): Payer: Medicaid Other

## 2019-08-17 ENCOUNTER — Emergency Department (HOSPITAL_COMMUNITY)
Admission: EM | Admit: 2019-08-17 | Discharge: 2019-08-17 | Discharge status: Against Medical Advice | Payer: Medicaid Other | Attending: Emergency Medicine | Admitting: Emergency Medicine

## 2019-08-17 ENCOUNTER — Encounter (HOSPITAL_COMMUNITY): Payer: Self-pay

## 2019-08-17 ENCOUNTER — Other Ambulatory Visit: Payer: Self-pay

## 2019-08-17 DIAGNOSIS — R55 Syncope and collapse: Secondary | ICD-10-CM

## 2019-08-17 DIAGNOSIS — Z87891 Personal history of nicotine dependence: Secondary | ICD-10-CM | POA: Insufficient documentation

## 2019-08-17 DIAGNOSIS — Z79899 Other long term (current) drug therapy: Secondary | ICD-10-CM | POA: Diagnosis not present

## 2019-08-17 DIAGNOSIS — R519 Headache, unspecified: Secondary | ICD-10-CM | POA: Insufficient documentation

## 2019-08-17 LAB — URINALYSIS, ROUTINE W REFLEX MICROSCOPIC
Bilirubin Urine: NEGATIVE
Glucose, UA: NEGATIVE mg/dL
Hgb urine dipstick: NEGATIVE
Ketones, ur: NEGATIVE mg/dL
Leukocytes,Ua: NEGATIVE
Nitrite: NEGATIVE
Protein, ur: NEGATIVE mg/dL
Specific Gravity, Urine: 1.019 (ref 1.005–1.030)
pH: 7 (ref 5.0–8.0)

## 2019-08-17 LAB — BASIC METABOLIC PANEL
Anion gap: 9 (ref 5–15)
BUN: 9 mg/dL (ref 6–20)
CO2: 25 mmol/L (ref 22–32)
Calcium: 9.3 mg/dL (ref 8.9–10.3)
Chloride: 104 mmol/L (ref 98–111)
Creatinine, Ser: 0.6 mg/dL (ref 0.44–1.00)
GFR calc Af Amer: >60 mL/min (ref >60)
GFR calc non Af Amer: >60 mL/min (ref >60)
Glucose, Bld: 85 mg/dL (ref 70–99)
Potassium: 3.3 mmol/L — ABNORMAL LOW (ref 3.5–5.1)
Sodium: 138 mmol/L (ref 135–145)

## 2019-08-17 LAB — CBC
HCT: 38.2 % (ref 36.0–46.0)
Hemoglobin: 11.9 g/dL — ABNORMAL LOW (ref 12.0–15.0)
MCH: 26.7 pg (ref 26.0–34.0)
MCHC: 31.2 g/dL (ref 30.0–36.0)
MCV: 85.7 fL (ref 80.0–100.0)
Platelets: 306 10*3/uL (ref 150–400)
RBC: 4.46 MIL/uL (ref 3.87–5.11)
RDW: 15.3 % (ref 11.5–15.5)
WBC: 8.7 10*3/uL (ref 4.0–10.5)
nRBC: 0 % (ref 0.0–0.2)

## 2019-08-17 LAB — I-STAT BETA HCG BLOOD, ED (MC, WL, AP ONLY): I-stat hCG, quantitative: <5 m[IU]/mL (ref <5)

## 2019-08-17 MED ORDER — DIPHENHYDRAMINE HCL 25 MG PO CAPS
25.0000 mg | ORAL_CAPSULE | Freq: Once | ORAL | Status: AC
  Administered 2019-08-17: 25 mg via ORAL
  Filled 2019-08-17: qty 1

## 2019-08-17 MED ORDER — SODIUM CHLORIDE 0.9 % IV BOLUS: 1000.0000 mL | Freq: Once | INTRAVENOUS | Status: DC

## 2019-08-17 MED ORDER — PROCHLORPERAZINE MALEATE 5 MG PO TABS
10.0000 mg | ORAL_TABLET | Freq: Once | ORAL | Status: AC
  Administered 2019-08-17: 10 mg via ORAL
  Filled 2019-08-17: qty 2

## 2019-08-17 MED ORDER — DIPHENHYDRAMINE HCL 50 MG/ML IJ SOLN
25.0000 mg | Freq: Once | INTRAMUSCULAR | Status: DC
  Filled 2019-08-17: qty 1

## 2019-08-17 MED ORDER — METOCLOPRAMIDE HCL 5 MG/ML IJ SOLN
10.0000 mg | Freq: Once | INTRAMUSCULAR | Status: DC
  Filled 2019-08-17: qty 2

## 2019-08-17 MED ORDER — SODIUM CHLORIDE 0.9% FLUSH: 3.0000 mL | Freq: Once | INTRAVENOUS | Status: DC

## 2019-08-17 NOTE — ED Triage Notes (Signed)
Patient complains of right breast pain for 3 days, reports no pregnancy has had tubal. Denies trauma. Reports syncopal event while getting in car today and waking up on ground. Complains of headache from the event

## 2019-08-17 NOTE — ED Provider Notes (Signed)
[facility] EMERGENCY DEPARTMENT Provider Note   CSN: [id number] Arrival date & time: 08/17/19  1053     History No chief complaint on file.   Emma Chambers is a 29 y.o. female.  29 y.o female with a PMH of chronic headache with syncopal episode.  Patient reports waking up with a headache, going to get evaluated at urgent care.  Bring complaint, reports opening a door, had a bright sunshine on her face, states she passed out.  Did not lose consciousness.  Endorses a frontal headache, reports a sharp sensation to bilateral eyes without any radiation.  States she was previously diagnosed with syncopal episodes, however "has not passed out in well".  Does report she ate this morning.  Also endorses right-sided breast pain, states there is a shooting sensation to the lateral aspect of her breast, reports a similar pain when she was previously pregnant.  Alleviating or exacerbating factors.  No nausea, vomiting, or shortness of breath.  The history is provided by the patient and medical records.       Past Medical History:  Diagnosis Date  . Anemia   . Gallstones   . H. pylori infection   . Headache(784.0)    chronic  . History of gestational hypertension 12/01/2017   -baseline labs at [facility] - Start baby ASA    Ref. Range 12/01/2017 09:57 Protein/Creat Ratio Latest Ref Range: 0 - 200 mg/g creat 135   Ref. Range 12/01/2017 09:23 AST Latest Ref Range: 0 - 40 IU/L 14 ALT Latest Ref Range: 0 - 32 IU/L 11   Ref. Range 12/01/2017 09:23 Platelets Latest Ref Range: 150 - 450 x10E3/uL 343   . 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 Kidney stones   . Obesity in pregnancy 03/24/2018  . Ovarian cyst   . Pregnancy induced hypertension   . UTI (lower urinary tract infection)     Patient Active Problem List   Diagnosis Date Noted  . Mild tetrahydrocannabinol (THC) abuse 04/16/2018  . 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 Generalized headaches  03/27/2011  . H. pylori infection 02/13/2011    Past Surgical History:  Procedure Laterality Date  . CHOLECYSTECTOMY N/A 07/27/2012   Procedure: LAPAROSCOPIC CHOLECYSTECTOMY WITH INTRAOPERATIVE CHOLANGIOGRAM;  Surgeon: Madilyn Hook, DO;  Location: [facility];  Service: General;  Laterality: N/A;  . DIAGNOSTIC LAPAROSCOPY WITH REMOVAL OF ECTOPIC PREGNANCY N/A 12/27/2014   Procedure: DIAGNOSTIC LAPAROSCOPY, Right Salpingectomy with removal ECTOPIC PREGNANCY;  Surgeon: [name], MD;  Location: [facility];  Service: Gynecology;  Laterality: N/A;  . LAPAROSCOPIC APPENDECTOMY N/A 12/09/2012   Procedure: APPENDECTOMY LAPAROSCOPIC;  Surgeon: Gwenyth Ober, MD;  Location: [facility];  Service: General;  Laterality: N/A;  . LAPAROSCOPIC TUBAL LIGATION Bilateral 11/04/2018   Procedure: LAPAROSCOPIC TUBAL LIGATION with Flishie Clip Left;  Surgeon: Aletha Halim, MD;  Location: [facility];  Service: Gynecology;  Laterality: Bilateral;     OB History    Gravida  4   Para  3   Term  2   Preterm  1   AB  1   Living  3     SAB  0   TAB  0   Ectopic  1   Multiple  0   Live Births  3        Obstetric Comments  2014: IOL at 61 weeks 2/2 gallstones 2013: GHTN 2016: ectopic pregnancy: Had methotrexate and then had surgery  Family History  Problem Relation Age of Onset  . Asthma Mother   . Hypertension Mother   . Parkinsonism Mother   . Asthma Brother   . Diabetes Maternal Grandmother   . Hypertension Maternal Grandmother   . Stroke Maternal Grandmother   . Heart disease Maternal Grandmother   . Lupus Maternal Aunt   . Heart disease Maternal Grandfather   . Anesthesia problems Neg Hx   . Malignant hyperthermia Neg Hx   . Pseudochol deficiency Neg Hx   . Hypotension Neg Hx     Social History   Tobacco Use  . Smoking status: Former Smoker    Types: Cigarettes  . Smokeless tobacco: Never Used  . Tobacco comment: 2014  Substance Use Topics  . Alcohol  use: Yes    Comment: social  . Drug use: Not Currently    Types: Marijuana    Home Medications Prior to Admission medications   Medication Sig Start Date End Date Taking? Authorizing Provider  ibuprofen (ADVIL) 600 MG tablet Take 1 tablet (600 mg total) by mouth every 6 (six) hours as needed. 11/04/18   Aletha Halim, MD  oxyCODONE-acetaminophen (PERCOCET/ROXICET) 5-325 MG tablet Take 1-2 tablets by mouth every 6 (six) hours as needed. 11/16/18   Aletha Halim, MD  polyethylene glycol (MIRALAX / GLYCOLAX) 17 g packet Take 17 g by mouth daily as needed. 11/04/18   Aletha Halim, MD  prochlorperazine (COMPAZINE) 10 MG tablet Take 1 tablet (10 mg total) by mouth every 6 (six) hours as needed for nausea or vomiting. 11/16/18   Aletha Halim, MD    Allergies    Amoxicillin, Contrast media [iodinated diagnostic agents], and Dilaudid [hydromorphone hcl]  Review of Systems   Review of Systems  Constitutional: Negative for chills and fever.  HENT: Negative for sore throat.   Respiratory: Negative for shortness of breath.   Cardiovascular: Negative for chest pain.  Gastrointestinal: Negative for abdominal pain, nausea and vomiting.  Genitourinary: Negative for flank pain.  Musculoskeletal: Negative for back pain.  Neurological: Positive for headaches. Negative for dizziness.  All other systems reviewed and are negative.   Physical Exam Updated Vital Signs BP 129/79 (BP Location: Right Arm)   Pulse 93   Temp 98.9 F (37.2 C) (Oral)   Resp 17   Ht 5' (1.524 m)   SpO2 99%   BMI 40.19 kg/m   Physical Exam Vitals and nursing note reviewed.  Constitutional:      Appearance: Normal appearance.  HENT:     Head: Normocephalic. No abrasion, contusion, masses or laceration.      Comments: TTp along the right upper eyebrow.     Mouth/Throat:     Mouth: Mucous membranes are moist.  Eyes:     Extraocular Movements: Extraocular movements intact.     Pupils: Pupils are equal,  round, and reactive to light.  Cardiovascular:     Rate and Rhythm: Normal rate.  Pulmonary:     Effort: Pulmonary effort is normal.     Breath sounds: No rales.  Abdominal:     General: Abdomen is flat.     Tenderness: There is no abdominal tenderness. There is no right CVA tenderness or left CVA tenderness.  Musculoskeletal:     Cervical back: Normal range of motion and neck supple.  Skin:    General: Skin is warm and dry.  Neurological:     Mental Status: She is alert and oriented to person, place, and time.     Comments:  Alert, oriented, thought content appropriate. Speech fluent without evidence of aphasia. Able to follow 2 step commands without difficulty.  Cranial Nerves:  II:  Peripheral visual fields grossly normal, pupils, round, reactive to light III,IV, VI: ptosis not present, extra-ocular motions intact bilaterally  V,VII: smile symmetric, facial light touch sensation equal VIII: hearing grossly normal bilaterally  IX,X: midline uvula rise  XI: bilateral shoulder shrug equal and strong XII: midline tongue extension  Motor:  5/5 in upper and lower extremities bilaterally including strong and equal grip strength and dorsiflexion/plantar flexion Sensory: light touch normal in all extremities.  Cerebellar: normal finger-to-nose with bilateral upper extremities, pronator drift negative       ED Results / Procedures / Treatments   Labs (all labs ordered are listed, but only abnormal results are displayed) Labs Reviewed  BASIC METABOLIC PANEL - Abnormal; Notable for the following components:      Result Value   Potassium 3.3 (*)    All other components within normal limits  CBC - Abnormal; Notable for the following components:   Hemoglobin 11.9 (*)    All other components within normal limits  URINALYSIS, ROUTINE W REFLEX MICROSCOPIC - Abnormal; Notable for the following components:   APPearance HAZY (*)    All other components within normal limits  I-STAT BETA  HCG BLOOD, ED ([facility], [facility], [facility] ONLY)    EKG EKG Interpretation  Date/Time:  Tuesday August 17 2019 11:24:57 EDT Ventricular Rate:  93 PR Interval:  156 QRS Duration: 72 QT Interval:  338 QTC Calculation: 420 R Axis:   52 Text Interpretation: Normal sinus rhythm new Nonspecific T wave abnormality Confirmed by Blanchie Dessert [phone]) on 08/17/2019 2:21:54 PM   Radiology CT Head Wo Contrast  Result Date: 08/17/2019 CLINICAL DATA:  Syncope, right-sided head trauma.  Headache EXAM: CT HEAD WITHOUT CONTRAST TECHNIQUE: Contiguous axial images were obtained from the base of the skull through the vertex without intravenous contrast. COMPARISON:  08/13/2018 FINDINGS: Brain: No evidence of acute infarction, hemorrhage, hydrocephalus, extra-axial collection or mass lesion/mass effect. Vascular: No hyperdense vessel or unexpected calcification. Skull: Normal. Negative for fracture or focal lesion. Sinuses/Orbits: No acute finding. Other: None. IMPRESSION: No acute intracranial findings. Electronically Signed   By: Davina Poke D.O.   On: 08/17/2019 13:54    Procedures Procedures (including critical care time)  Medications Ordered in ED Medications  sodium chloride flush (NS) 0.9 % injection 3 mL (3 mLs Intravenous Not Given 08/17/19 1337)  sodium chloride 0.9 % bolus 1,000 mL (1,000 mLs Intravenous Refused 08/17/19 1337)  diphenhydrAMINE (BENADRYL) capsule 25 mg (25 mg Oral Given 08/17/19 1346)  prochlorperazine (COMPAZINE) tablet 10 mg (10 mg Oral Given 08/17/19 1346)    ED Course  I have reviewed the triage vital signs and the nursing notes.  Pertinent labs & imaging results that were available during my care of the patient were reviewed by me and considered in my medical decision making (see chart for details).    MDM Rules/Calculators/A&P     Patient with a past medical history of syncopal episodes presents to the ED status post syncopal episode today while attempting to go to urgent care in  order to have her right breast examined.  Reports a sharp pain to her right breast, this began yesterday, states this has been ongoing, similar symptoms were noted when she was previously pregnant, she does have a current tubal ligation.  Had 2 periods this past month but these have now stopped.  During evaluation patient  is overall not ill-appearing, nontoxic, vitals are within normal limits save for slight elevation of her heart rate at 93.  Denies any chest pain, shortness of breath.  Does have right breast pain along with headache.  According to her records a prior visit for a chronic headache was noted.  Attempted to give patient headache cocktail via IV, she refuses medication at this time therefore this was switched to oral medication.  During my evaluation she is neurologically intact, was ambulatory in the ED with a steady gait.  Lungs were clear to auscultation, abdomen is soft, nontender to palpation.  No sounds are present throughout symmetric.  The static vital signs were obtained, these were negative.  She was offered a liter of fluids however patient refused. CT Head showed: No acute intracranial findings.  Interpretation of her labs showed CBC without any leukocytosis, hemoglobin slightly decreased patient did have her cycle. BMP with mild hypokalemia, no electrolyte derangement.  Creatinine level within normal limits.beta hcg is negative.  EKG reviewed without any changes.  Unfortunately, patient has eloped the emergency department pending her work-up.  Unable to provide her with the results of any of her lab work.    Portions of this note were generated with [employer]obby[employer]. Dictation errors may occur despite best attempts at proofreading.  Final Clinical Impression(s) / ED Diagnoses Final diagnoses:  Syncope, unspecified syncope type  Bad headache    Rx / DC Orders ED Discharge Orders    None       Janeece Fitting, PA-C 08/17/19 1449    Blanchie Dessert, MD  09/01/19 1703

## 2019-08-17 NOTE — ED Notes (Signed)
Eval in waiting room. Pt states h/o diagnosed syncope for approx 1 year of unknown cause per pt. Pt states she was walking today and had syncope, fell and struck her head and now c/o head pain to right side. Pt AOx4. Per Dr. Mannie Stabile, pt advised to go to ER stat for higher level eval/tx. Pt voiced understanding. Pt declined wheelchair transport and states friend/family escort.

## 2019-10-16 ENCOUNTER — Ambulatory Visit (HOSPITAL_COMMUNITY)
Admission: EM | Admit: 2019-10-16 | Discharge: 2019-10-16 | Disposition: A | Discharge status: Home / Self Care | Payer: Medicaid Other | Attending: Family Medicine | Admitting: Family Medicine

## 2019-10-16 ENCOUNTER — Other Ambulatory Visit: Payer: Self-pay

## 2019-10-16 ENCOUNTER — Encounter (HOSPITAL_COMMUNITY): Payer: Self-pay

## 2019-10-16 DIAGNOSIS — N920 Excessive and frequent menstruation with regular cycle: Secondary | ICD-10-CM | POA: Diagnosis present

## 2019-10-16 DIAGNOSIS — R109 Unspecified abdominal pain: Secondary | ICD-10-CM | POA: Diagnosis not present

## 2019-10-16 DIAGNOSIS — Z3202 Encounter for pregnancy test, result negative: Secondary | ICD-10-CM

## 2019-10-16 DIAGNOSIS — K5900 Constipation, unspecified: Secondary | ICD-10-CM | POA: Diagnosis present

## 2019-10-16 DIAGNOSIS — M549 Dorsalgia, unspecified: Secondary | ICD-10-CM

## 2019-10-16 LAB — POCT URINALYSIS DIP (DEVICE)
Bilirubin Urine: NEGATIVE
Glucose, UA: NEGATIVE mg/dL
Hgb urine dipstick: NEGATIVE
Ketones, ur: NEGATIVE mg/dL
Nitrite: NEGATIVE
Protein, ur: NEGATIVE mg/dL
Specific Gravity, Urine: 1.02 (ref 1.005–1.030)
Urobilinogen, UA: 0.2 mg/dL (ref 0.0–1.0)
pH: 8.5 — ABNORMAL HIGH (ref 5.0–8.0)

## 2019-10-16 LAB — POC URINE PREG, ED: Preg Test, Ur: NEGATIVE

## 2019-10-16 MED ORDER — IBUPROFEN 800 MG PO TABS
800.0000 mg | ORAL_TABLET | Freq: Three times a day (TID) | ORAL | 0 refills | Status: DC
Start: 2019-10-16 — End: 2020-01-19

## 2019-10-16 NOTE — Discharge Instructions (Addendum)
You may try over the counter MIRALAX to help with constipation.  Your evaluation was not suggestive of any emergent condition requiring medical intervention at this time. However, some abdominal problems make take more time to appear. Therefore, it is very important for you to pay attention to any new symptoms or worsening of your current condition.  Please return here or to the Emergency Department immediately should you begin to feel worse in any way or have any of the following symptoms: increasing or different abdominal pain, persistent vomiting, inability to drink fluids, fevers, or shaking chills.

## 2019-10-16 NOTE — ED Triage Notes (Signed)
Pt presents with pelvic pain and lower back pain x 1 week. States she had her menstrual period 3 times this month. P has not take any medication for the complaints.

## 2019-10-16 NOTE — ED Provider Notes (Signed)
[facility]   [id number] 10/16/19 Arrival Time: 1106  ASSESSMENT & PLAN:  1. Abdominal discomfort   2. Perceived constipation   3. Excessive or frequent menstruation     Benign abdominal exam. No indications for urgent abdominal/pelvic imaging at this time. See AVS for written d/c information.  For frequent menstrual periods, recommend:  Follow-up Information    Schedule an appointment as soon as possible for a visit  with [facility].   Specialty: Obstetrics and Gynecology Contact information: [place] [address] [phone]              Meds ordered this encounter  Medications  . ibuprofen (ADVIL) 800 MG tablet    Sig: Take 1 tablet (800 mg total) by mouth 3 (three) times daily with meals.    Dispense:  21 tablet    Refill:  0     Discharge Instructions     You may try over the counter MIRALAX to help with constipation.  Your evaluation was not suggestive of any emergent condition requiring medical intervention at this time. However, some abdominal problems make take more time to appear. Therefore, it is very important for you to pay attention to any new symptoms or worsening of your current condition.  Please return here or to the Emergency Department immediately should you begin to feel worse in any way or have any of the following symptoms: increasing or different abdominal pain, persistent vomiting, inability to drink fluids, fevers, or shaking chills.          Reviewed expectations re: course of current medical issues. Questions answered. Outlined signs and symptoms indicating need for more acute intervention. Patient verbalized understanding. After Visit Summary given.   SUBJECTIVE: History from: patient. Emma Chambers is a 29 y.o. female who presents with complaint of lower abdominal/pelvic discomfort. Gradual onset; over past week. Dull. Feels  more R-sided at times. Patient's last menstrual period was 10/15/2019 (exact date). Reports frequent periods; "three this month". Is not sexually active. No urinary symptoms or vaginal discharge. Mild nausea at times; decreased appetite and PO intake. Feels constipated; last BM 2 d ago; usually moves bowels daily. Feels bloated. Afebrile. Mild lower back discomfort at times. Ambulatory without difficulty. No specific aggravating or alleviating factors reported.   Past Surgical History:  Procedure Laterality Date  . CHOLECYSTECTOMY N/A 07/27/2012   Procedure: LAPAROSCOPIC CHOLECYSTECTOMY WITH INTRAOPERATIVE CHOLANGIOGRAM;  Surgeon: Madilyn Hook, DO;  Location: [facility];  Service: General;  Laterality: N/A;  . DIAGNOSTIC LAPAROSCOPY WITH REMOVAL OF ECTOPIC PREGNANCY N/A 12/27/2014   Procedure: DIAGNOSTIC LAPAROSCOPY, Right Salpingectomy with removal ECTOPIC PREGNANCY;  Surgeon: [name], MD;  Location: [facility];  Service: Gynecology;  Laterality: N/A;  . LAPAROSCOPIC APPENDECTOMY N/A 12/09/2012   Procedure: APPENDECTOMY LAPAROSCOPIC;  Surgeon: Gwenyth Ober, MD;  Location: [facility];  Service: General;  Laterality: N/A;  . LAPAROSCOPIC TUBAL LIGATION Bilateral 11/04/2018   Procedure: LAPAROSCOPIC TUBAL LIGATION with Flishie Clip Left;  Surgeon: Aletha Halim, MD;  Location: [facility];  Service: Gynecology;  Laterality: Bilateral;     OBJECTIVE:  Vitals:   10/16/19 1205  BP: 125/75  Pulse: 72  Resp: 16  Temp: 99.5 F (37.5 C)  TempSrc: Oral  SpO2: 100%    General appearance: alert, oriented, no acute distress HEENT: [place]; AT Lungs: unlabored respirations Abdomen: obese; soft; without distention; moderate and poorly localized tenderness to palpation over lower abdomen R>L;  without appreciable masses or organomegaly; without guarding or rebound tenderness Back: without reported CVA tenderness; FROM at waist Extremities: without LE edema; symmetrical; without gross  deformities Skin: warm and dry Neurologic: normal gait Psychological: alert and cooperative; normal mood and affect  Labs: Results for orders placed or performed during the hospital encounter of 10/16/19  POC urine pregnancy  Result Value Ref Range   Preg Test, Ur NEGATIVE NEGATIVE  POCT urinalysis dip (device)  Result Value Ref Range   Glucose, UA NEGATIVE NEGATIVE mg/dL   Bilirubin Urine NEGATIVE NEGATIVE   Ketones, ur NEGATIVE NEGATIVE mg/dL   Specific Gravity, Urine 1.020 1.005 - 1.030   Hgb urine dipstick NEGATIVE NEGATIVE   pH 8.5 (H) 5.0 - 8.0   Protein, ur NEGATIVE NEGATIVE mg/dL   Urobilinogen, UA 0.2 0.0 - 1.0 mg/dL   Nitrite NEGATIVE NEGATIVE   Leukocytes,Ua TRACE (A) NEGATIVE   Labs Reviewed  POCT URINALYSIS DIP (DEVICE) - Abnormal; Notable for the following components:      Result Value   pH 8.5 (*)    Leukocytes,Ua TRACE (*)    All other components within normal limits  URINE CULTURE  POC URINE PREG, ED     Allergies  Allergen Reactions  . Amoxicillin Nausea And Vomiting, Other (See Comments) and Rash    Tics & twitches Has patient had a PCN reaction causing immediate rash, facial/tongue/throat swelling, SOB or lightheadedness with hypotension: Yes Has patient had a PCN reaction causing severe rash involving mucus membranes or skin necrosis: No Has patient had a PCN reaction that required hospitalization No Has patient had a PCN reaction occurring within the last 10 years: Yes If all of the above answers are "NO", then may proceed with Cephalosporin use.  . Contrast Media [Iodinated Diagnostic Agents] Hives  . Hydromorphone Hcl Rash  . Dilaudid [Hydromorphone Hcl] Itching                                               Past Medical History:  Diagnosis Date  . Anemia   . Gallstones   . H. pylori infection   . Headache(784.0)    chronic  . History of gestational hypertension 12/01/2017   -baseline labs at [facility] - Start baby ASA    Ref. Range 12/01/2017  09:57 Protein/Creat Ratio Latest Ref Range: 0 - 200 mg/g creat 135   Ref. Range 12/01/2017 09:23 AST Latest Ref Range: 0 - 40 IU/L 14 ALT Latest Ref Range: 0 - 32 IU/L 11   Ref. Range 12/01/2017 09:23 Platelets Latest Ref Range: 150 - 450 x10E3/uL 343   . 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 Kidney stones   . Obesity in pregnancy 03/24/2018  . Ovarian cyst   . Pregnancy induced hypertension   . UTI (lower urinary tract infection)     Social History   Socioeconomic History  . Marital status: Single    Spouse name: Not on file  . Number of children: Not on file  . Years of education: Not on file  . Highest education level: Not on file  Occupational History  . Not on file  Tobacco Use  . Smoking status: Former Smoker    Types: Cigarettes  . Smokeless tobacco: Never Used  . Tobacco comment: 2014  Vaping Use  . Vaping Use: Never used  Substance and Sexual Activity  . Alcohol use: Yes    Comment: social  . Drug use: Not Currently    Types: Marijuana  . Sexual activity: Yes    Birth control/protection: None    Comment: last IC- approx one month ago  Other Topics Concern  . Not on file  Social History Narrative  . Not on file   Social Determinants of Health   Financial Resource Strain:   . Difficulty of Paying Living Expenses:   Food Insecurity:   . Worried About Charity fundraiser in the Last Year:   . Arboriculturist in the Last Year:   Transportation Needs:   . Film/video editor (Medical):   Marland Kitchen Lack of Transportation (Non-Medical):   Physical Activity:   . Days of Exercise per Week:   . Minutes of Exercise per Session:   Stress:   . Feeling of Stress :   Social Connections:   . Frequency of Communication with Friends and Family:   . Frequency of Social Gatherings with Friends and Family:   . Attends Religious Services:   . Active Member of Clubs or Organizations:   . Attends Archivist Meetings:   Marland Kitchen  Marital Status:   Intimate Partner Violence:   . Fear of Current or Ex-Partner:   . Emotionally Abused:   Marland Kitchen Physically Abused:   . Sexually Abused:     Family History  Problem Relation Age of Onset  . Asthma Mother   . Hypertension Mother   . Parkinsonism Mother   . Asthma Brother   . Diabetes Maternal Grandmother   . Hypertension Maternal Grandmother   . Stroke Maternal Grandmother   . Heart disease Maternal Grandmother   . Lupus Maternal Aunt   . Heart disease Maternal Grandfather   . Anesthesia problems Neg Hx   . Malignant hyperthermia Neg Hx   . Pseudochol deficiency Neg Hx   . Hypotension Neg Hx      Vanessa Kick, MD 10/16/19 1240

## 2019-10-17 LAB — URINE CULTURE

## 2020-01-19 ENCOUNTER — Other Ambulatory Visit: Payer: Self-pay

## 2020-01-19 ENCOUNTER — Encounter (HOSPITAL_COMMUNITY): Payer: Self-pay | Admitting: Emergency Medicine

## 2020-01-19 ENCOUNTER — Emergency Department (HOSPITAL_COMMUNITY)
Admission: EM | Admit: 2020-01-19 | Discharge: 2020-01-19 | Discharge status: Against Medical Advice | Payer: Medicaid Other | Attending: Emergency Medicine | Admitting: Emergency Medicine

## 2020-01-19 DIAGNOSIS — H5319 Other subjective visual disturbances: Secondary | ICD-10-CM | POA: Insufficient documentation

## 2020-01-19 DIAGNOSIS — M542 Cervicalgia: Secondary | ICD-10-CM | POA: Diagnosis not present

## 2020-01-19 DIAGNOSIS — Z20822 Contact with and (suspected) exposure to covid-19: Secondary | ICD-10-CM | POA: Diagnosis not present

## 2020-01-19 DIAGNOSIS — R519 Headache, unspecified: Secondary | ICD-10-CM | POA: Insufficient documentation

## 2020-01-19 DIAGNOSIS — Z87891 Personal history of nicotine dependence: Secondary | ICD-10-CM | POA: Insufficient documentation

## 2020-01-19 LAB — CBC WITH DIFFERENTIAL/PLATELET
Abs Immature Granulocytes: 0.02 10*3/uL (ref 0.00–0.07)
Basophils Absolute: 0 10*3/uL (ref 0.0–0.1)
Basophils Relative: 0 %
Eosinophils Absolute: 0.3 10*3/uL (ref 0.0–0.5)
Eosinophils Relative: 3 %
HCT: 37.3 % (ref 36.0–46.0)
Hemoglobin: 11.7 g/dL — ABNORMAL LOW (ref 12.0–15.0)
Immature Granulocytes: 0 %
Lymphocytes Relative: 24 %
Lymphs Abs: 2 10*3/uL (ref 0.7–4.0)
MCH: 26.7 pg (ref 26.0–34.0)
MCHC: 31.4 g/dL (ref 30.0–36.0)
MCV: 85 fL (ref 80.0–100.0)
Monocytes Absolute: 0.4 10*3/uL (ref 0.1–1.0)
Monocytes Relative: 5 %
Neutro Abs: 5.6 10*3/uL (ref 1.7–7.7)
Neutrophils Relative %: 68 %
Platelets: 289 10*3/uL (ref 150–400)
RBC: 4.39 MIL/uL (ref 3.87–5.11)
RDW: 14.6 % (ref 11.5–15.5)
WBC: 8.4 10*3/uL (ref 4.0–10.5)
nRBC: 0 % (ref 0.0–0.2)

## 2020-01-19 LAB — COMPREHENSIVE METABOLIC PANEL
ALT: 11 U/L (ref 0–44)
AST: 16 U/L (ref 15–41)
Albumin: 3.8 g/dL (ref 3.5–5.0)
Alkaline Phosphatase: 64 U/L (ref 38–126)
Anion gap: 11 (ref 5–15)
BUN: 8 mg/dL (ref 6–20)
CO2: 22 mmol/L (ref 22–32)
Calcium: 8.8 mg/dL — ABNORMAL LOW (ref 8.9–10.3)
Chloride: 103 mmol/L (ref 98–111)
Creatinine, Ser: 0.56 mg/dL (ref 0.44–1.00)
GFR, Estimated: >60 mL/min (ref >60)
Glucose, Bld: 92 mg/dL (ref 70–99)
Potassium: 3.6 mmol/L (ref 3.5–5.1)
Sodium: 136 mmol/L (ref 135–145)
Total Bilirubin: 0.4 mg/dL (ref 0.3–1.2)
Total Protein: 6.9 g/dL (ref 6.5–8.1)

## 2020-01-19 LAB — URINALYSIS, ROUTINE W REFLEX MICROSCOPIC
Bacteria, UA: NONE SEEN
Bilirubin Urine: NEGATIVE
Glucose, UA: NEGATIVE mg/dL
Ketones, ur: NEGATIVE mg/dL
Leukocytes,Ua: NEGATIVE
Nitrite: NEGATIVE
Protein, ur: NEGATIVE mg/dL
Specific Gravity, Urine: 1.015 (ref 1.005–1.030)
pH: 6 (ref 5.0–8.0)

## 2020-01-19 LAB — RESPIRATORY PANEL BY RT PCR (FLU A&B, COVID)
Influenza A by PCR: NEGATIVE
Influenza B by PCR: NEGATIVE
SARS Coronavirus 2 by RT PCR: NEGATIVE

## 2020-01-19 LAB — I-STAT BETA HCG BLOOD, ED (MC, WL, AP ONLY): I-stat hCG, quantitative: <5 m[IU]/mL (ref <5)

## 2020-01-19 LAB — LACTIC ACID, PLASMA: Lactic Acid, Venous: 0.9 mmol/L (ref 0.5–1.9)

## 2020-01-19 MED ORDER — METHYLPREDNISOLONE SODIUM SUCC 40 MG IJ SOLR
40.0000 mg | Freq: Once | INTRAMUSCULAR | Status: AC
  Administered 2020-01-19: 40 mg via INTRAVENOUS
  Filled 2020-01-19: qty 1

## 2020-01-19 MED ORDER — KETOROLAC TROMETHAMINE 30 MG/ML IJ SOLN
30.0000 mg | Freq: Once | INTRAMUSCULAR | Status: AC
  Administered 2020-01-19: 30 mg via INTRAVENOUS
  Filled 2020-01-19: qty 1

## 2020-01-19 MED ORDER — DIPHENHYDRAMINE HCL 50 MG/ML IJ SOLN: 50.0000 mg | Freq: Once | INTRAMUSCULAR | Status: DC

## 2020-01-19 MED ORDER — PROCHLORPERAZINE EDISYLATE 10 MG/2ML IJ SOLN
10.0000 mg | Freq: Once | INTRAMUSCULAR | Status: AC
  Administered 2020-01-19: 10 mg via INTRAVENOUS
  Filled 2020-01-19: qty 2

## 2020-01-19 MED ORDER — SODIUM CHLORIDE 0.9 % IV BOLUS
1000.0000 mL | Freq: Once | INTRAVENOUS | Status: AC
  Administered 2020-01-19: 1000 mL via INTRAVENOUS

## 2020-01-19 MED ORDER — DIPHENHYDRAMINE HCL 50 MG/ML IJ SOLN
25.0000 mg | Freq: Once | INTRAMUSCULAR | Status: AC
  Administered 2020-01-19: 25 mg via INTRAVENOUS
  Filled 2020-01-19: qty 1

## 2020-01-19 NOTE — ED Triage Notes (Signed)
History of migraines and past month migraine worsening with radiating right neck and right shoulder pain. Seen at [facility] office 2 weeks ago has a CT head scheduled 02/08/2020 however here for evaluation for continued headache 10/10 throbbing with light sensitivity.

## 2020-01-19 NOTE — Discharge Instructions (Addendum)
Follow-up with a CAT scan tomorrow that your primary care doctor has arranged.

## 2020-01-19 NOTE — ED Provider Notes (Signed)
  Physical Exam  BP 111/66 (BP Location: Right Arm)   Pulse 91   Temp 99.2 F (37.3 C) (Oral)   Resp 16   Ht 5' (1.524 m)   Wt 99.8 kg   SpO2 100%   BMI 42.97 kg/m   Physical Exam  ED Course/Procedures     Procedures  MDM  Received patient signout.  Headache over the last 6 weeks.  Also has a low-grade fever.  Family members with URI symptoms.  Lab work overall reassuring.  CT venogram ordered in the ER.  However patient states she does not want a wait any longer.  States her PCP has arranged for an outpatient CT.  Will discharge home.       Davonna Belling, MD 01/19/20 1751

## 2020-01-19 NOTE — ED Notes (Signed)
Pt refused to wait for RN to come assess, encourage staying and doing d/c vitals.

## 2020-01-19 NOTE — ED Provider Notes (Signed)
[facility] EMERGENCY DEPARTMENT Provider Note   CSN: [id number] Arrival date & time: 01/19/20  1207     History Chief Complaint  Patient presents with  . Migraine  . Neck Pain  . Shoulder Pain    Emma Chambers is a 29 y.o. female.  The history is provided by the patient and medical records. No language interpreter was used.  Headache Pain location:  Generalized Quality:  Dull Severity currently:  9/10 Severity at highest:  10/10 Onset quality:  Gradual Duration:  6 weeks Timing:  Constant Progression:  Worsening Chronicity:  New Context: bright light   Relieved by:  Nothing Worsened by:  Nothing Ineffective treatments:  None tried Associated symptoms: neck pain (right lateral neck pain), photophobia and visual change (flashes and auras at times)   Associated symptoms: no abdominal pain, no back pain, no blurred vision, no congestion, no cough, no diarrhea, no dizziness, no eye pain, no facial pain, no fatigue, no fever, no focal weakness, no hearing loss, no loss of balance, no nausea, no near-syncope, no neck stiffness, no numbness, no paresthesias, no seizures, no tingling and no vomiting        Past Medical History:  Diagnosis Date  . Anemia   . Gallstones   . H. pylori infection   . Headache(784.0)    chronic  . History of gestational hypertension 12/01/2017   -baseline labs at [facility] - Start baby ASA    Ref. Range 12/01/2017 09:57 Protein/Creat Ratio Latest Ref Range: 0 - 200 mg/g creat 135   Ref. Range 12/01/2017 09:23 AST Latest Ref Range: 0 - 40 IU/L 14 ALT Latest Ref Range: 0 - 32 IU/L 11   Ref. Range 12/01/2017 09:23 Platelets Latest Ref Range: 150 - 450 x10E3/uL 343   . 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 Kidney stones   . Obesity in pregnancy 03/24/2018  . Ovarian cyst   . Pregnancy induced hypertension   . UTI (lower urinary tract infection)     Patient Active Problem List   Diagnosis  Date Noted  . Mild tetrahydrocannabinol (THC) abuse 04/16/2018  . 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 Generalized headaches 03/27/2011  . H. pylori infection 02/13/2011    Past Surgical History:  Procedure Laterality Date  . CHOLECYSTECTOMY N/A 07/27/2012   Procedure: LAPAROSCOPIC CHOLECYSTECTOMY WITH INTRAOPERATIVE CHOLANGIOGRAM;  Surgeon: Madilyn Hook, DO;  Location: [facility];  Service: General;  Laterality: N/A;  . DIAGNOSTIC LAPAROSCOPY WITH REMOVAL OF ECTOPIC PREGNANCY N/A 12/27/2014   Procedure: DIAGNOSTIC LAPAROSCOPY, Right Salpingectomy with removal ECTOPIC PREGNANCY;  Surgeon: [name], MD;  Location: [facility];  Service: Gynecology;  Laterality: N/A;  . LAPAROSCOPIC APPENDECTOMY N/A 12/09/2012   Procedure: APPENDECTOMY LAPAROSCOPIC;  Surgeon: Gwenyth Ober, MD;  Location: [place];  Service: General;  Laterality: N/A;  . LAPAROSCOPIC TUBAL LIGATION Bilateral 11/04/2018   Procedure: LAPAROSCOPIC TUBAL LIGATION with Flishie Clip Left;  Surgeon: Aletha Halim, MD;  Location: [place];  Service: Gynecology;  Laterality: Bilateral;     OB History    Gravida  4   Para  3   Term  2   Preterm  1   AB  1   Living  3     SAB  0   TAB  0   Ectopic  1   Multiple  0   Live Births  3        Obstetric Comments  2014: IOL at 94 weeks 2/2 gallstones 2013: GHTN 2016: ectopic pregnancy: Had methotrexate and then had surgery         Family History  Problem Relation Age of Onset  . Asthma Mother   . Hypertension Mother   . Parkinsonism Mother   . Asthma Brother   . Diabetes Maternal Grandmother   . Hypertension Maternal Grandmother   . Stroke Maternal Grandmother   . Heart disease Maternal Grandmother   . Lupus Maternal Aunt   . Heart disease Maternal Grandfather   . Anesthesia problems Neg Hx   . Malignant hyperthermia Neg Hx   . Pseudochol deficiency Neg Hx   . Hypotension Neg Hx     Social History   Tobacco Use  . Smoking status: Former Smoker     Types: Cigarettes  . Smokeless tobacco: Never Used  . Tobacco comment: 2014  Vaping Use  . Vaping Use: Never used  Substance Use Topics  . Alcohol use: Yes    Comment: social  . Drug use: Not Currently    Types: Marijuana    Home Medications Prior to Admission medications   Medication Sig Start Date End Date Taking? Authorizing Provider  baclofen (LIORESAL) 10 MG tablet Take 10 mg by mouth 3 (three) times daily as needed for muscle pain. 01/03/20  Yes [provider]  ibuprofen (ADVIL) 800 MG tablet Take 1 tablet (800 mg total) by mouth 3 (three) times daily with meals. 10/16/19   Vanessa Kick, MD  polyethylene glycol (MIRALAX / GLYCOLAX) 17 g packet Take 17 g by mouth daily as needed. 11/04/18   Aletha Halim, MD  prochlorperazine (COMPAZINE) 10 MG tablet Take 1 tablet (10 mg total) by mouth every 6 (six) hours as needed for nausea or vomiting. 11/16/18   Aletha Halim, MD    Allergies    Amoxicillin, Contrast media [iodinated diagnostic agents], Hydromorphone hcl, and Dilaudid [hydromorphone hcl]  Review of Systems   Review of Systems  Constitutional: Negative for chills, diaphoresis, fatigue and fever.  HENT: Negative for congestion and hearing loss.   Eyes: Positive for photophobia. Negative for blurred vision, pain and visual disturbance.  Respiratory: Negative for cough, chest tightness, shortness of breath and wheezing.   Cardiovascular: Negative for chest pain, palpitations, leg swelling and near-syncope.  Gastrointestinal: Negative for abdominal pain, constipation, diarrhea, nausea and vomiting.  Genitourinary: Negative for dysuria, flank pain and frequency.  Musculoskeletal: Positive for neck pain (right lateral neck pain). Negative for back pain and neck stiffness.  Skin: Negative for rash and wound.  Neurological: Positive for headaches. Negative for dizziness, focal weakness, seizures, light-headedness, numbness, paresthesias and loss of balance.   Psychiatric/Behavioral: Negative for agitation, behavioral problems and confusion.  All other systems reviewed and are negative.   Physical Exam Updated Vital Signs BP (!) 148/106   Pulse 98   Temp (!) 100.7 F (38.2 C) (Oral)   Resp 19   Ht 5' (1.524 m)   Wt 99.8 kg   SpO2 100%   BMI 42.97 kg/m   Physical Exam Vitals and nursing note reviewed.  Constitutional:      General: She is not in acute distress.    Appearance: She is well-developed. She is not ill-appearing, toxic-appearing or diaphoretic.  HENT:     Head: Normocephalic and atraumatic.     Right Ear: External ear normal.     Left Ear: External ear normal.     Nose: Nose normal. No congestion or rhinorrhea.  Mouth/Throat:     Mouth: Mucous membranes are moist.     Pharynx: No oropharyngeal exudate or posterior oropharyngeal erythema.  Eyes:     Extraocular Movements: Extraocular movements intact.     Conjunctiva/sclera: Conjunctivae normal.     Pupils: Pupils are equal, round, and reactive to light.  Neck:     Vascular: No carotid bruit.   Cardiovascular:     Rate and Rhythm: Normal rate.     Pulses: Normal pulses.     Heart sounds: No murmur heard.   Pulmonary:     Effort: Pulmonary effort is normal. No respiratory distress.     Breath sounds: No stridor. No wheezing, rhonchi or rales.  Chest:     Chest wall: No tenderness.  Abdominal:     General: Abdomen is flat. There is no distension.     Tenderness: There is no abdominal tenderness. There is no right CVA tenderness, left CVA tenderness, guarding or rebound.  Musculoskeletal:        General: Tenderness present.     Cervical back: Normal range of motion and neck supple. Tenderness present. No rigidity. Muscular tenderness present. No spinous process tenderness.     Right lower leg: No edema.     Left lower leg: No edema.  Skin:    General: Skin is warm.     Capillary Refill: Capillary refill takes less than 2 seconds.     Coloration: Skin is  not pale.     Findings: No erythema or rash.  Neurological:     General: No focal deficit present.     Mental Status: She is alert and oriented to person, place, and time.     Cranial Nerves: No cranial nerve deficit.     Sensory: No sensory deficit.     Motor: No weakness or abnormal muscle tone.     Coordination: Coordination normal.     Gait: Gait normal.     Deep Tendon Reflexes: Reflexes are normal and symmetric.  Psychiatric:        Mood and Affect: Mood normal.     ED Results / Procedures / Treatments   Labs (all labs ordered are listed, but only abnormal results are displayed) Labs Reviewed  CBC WITH DIFFERENTIAL/PLATELET - Abnormal; Notable for the following components:      Result Value   Hemoglobin 11.7 (*)    All other components within normal limits  COMPREHENSIVE METABOLIC PANEL - Abnormal; Notable for the following components:   Calcium 8.8 (*)    All other components within normal limits  URINALYSIS, ROUTINE W REFLEX MICROSCOPIC - Abnormal; Notable for the following components:   Hgb urine dipstick SMALL (*)    All other components within normal limits  RESPIRATORY PANEL BY RT PCR (FLU A&B, COVID)  URINE CULTURE  LACTIC ACID, PLASMA  LACTIC ACID, PLASMA  I-STAT BETA HCG BLOOD, ED ([facility], [facility], [facility] ONLY)    EKG None  Radiology No results found.  Procedures Procedures (including critical care time)  Medications Ordered in ED Medications  diphenhydrAMINE (BENADRYL) injection 50 mg (has no administration in time range)  sodium chloride 0.9 % bolus 1,000 mL (1,000 mLs Intravenous New Bag/Given 01/19/20 1427)  prochlorperazine (COMPAZINE) injection 10 mg (10 mg Intravenous Given 01/19/20 1432)  diphenhydrAMINE (BENADRYL) injection 25 mg (25 mg Intravenous Given 01/19/20 1430)  methylPREDNISolone sodium succinate (SOLU-MEDROL) 40 mg/mL injection 40 mg (40 mg Intravenous Given 01/19/20 1534)    ED Course  I have reviewed the triage vital  signs and the nursing  notes.  Pertinent labs & imaging results that were available during my care of the patient were reviewed by me and considered in my medical decision making (see chart for details).    MDM Rules/Calculators/A&P                          AARION KITTRELL is a 29 y.o. female with a past medical history significant for prior cholecystectomy, prior appendectomy, prior tubal ligation and prior ectopic pregnancy surgery, kidney stones, prior headaches, who presents with continued headache.  Patient reports that for the last month and a half, she has had severe headaches.  She reports that she has had headaches over the years but nothing quite like this.  She reports it is throbbing and 10 out of 10 and feels like it is a pressure behind her eyes and the top of her head going back in a line.  She reports that she has had occasional flashes and floaters in her vision but denies persistent vision changes.  There is extreme photosensitivity.  She reports no nausea or vomiting and denies any chest pain, shortness of breath, or cough.  She does report her daughter currently has a URI.  Patient reports no urinary symptoms but has had UTIs in the past.  She denies any constipation or diarrhea.  She reports that she has had some muscle spasms on the right side of her neck going towards her arm but denies any persistent symptoms.  She was told by her PCP that she was going to get a CT scan in 3 weeks if her symptoms persisted.  She has been on steroids and other headache medications without significant relief.  She presents for reevaluation.  On arrival, patient was found to be febrile with a temperature of 100.7.  She is not tachycardic or tachypneic.  She has normal oxygen saturations and is not hypotensive.  On exam, she did not have nuchal rigidity.  She did have some lateral right neck tenderness going down towards her shoulder but otherwise did not have any focal neurologic deficits.  Her pupils were symmetric and  reactive bilaterally.  Normal extraocular movements without causing pain.  Clear speech.  Normal sensation and strength in extremities.  Normal finger-nose-finger testing bilaterally.  Normal gait as she walked to the bathroom.  No midline neck tenderness.  No carotid bruit appreciated.  Lungs clear and chest nontender.  Abdomen nontender.  Patient otherwise well-appearing.  No rashes seen.  Clinically this is likely a URI causing fever from her daughter exacerbating this chronic headache that she has however, given the location and atypical nature of the headache and timeline, a dural venous sinus thrombosis is considered.  We will get a CTV as well as give her a headache cocktail.  We will get screening labs look for other sources of infection such as Covid, flu, UTI, and pneumonia.  We will get other screening labs.  Had a shared decision-making conversation given her lack of nuchal rigidity and the timeline of over 1 month of symptoms, have low suspicion for meningitis as she likely would have done much worse if this was meningitis over weeks.  Patient agrees to hold on lumbar puncture at this time.  Anticipate reassessment after work-up to determine disposition.  We determined that patient has a contrast allergy to dye.  We will give her premedication with Solu-Medrol and Benadryl before she gets the CTV to rule out  venous sinus thrombosis.  She still waiting other labs.  She has no leukocytosis and given her description of symptoms and timeline, still do not feel she has meningitis at this time.  Care transferred to oncoming team while waiting for results of diagnostic work-up including CTV.  Anticipate disposition based on findings but if there is no concerning findings and she is feeling better, anticipate discharge to follow-up with outpatient neurology.   Final Clinical Impression(s) / ED Diagnoses Final diagnoses:  Intractable headache, unspecified chronicity pattern, unspecified headache  type     Clinical Impression: 1. Intractable headache, unspecified chronicity pattern, unspecified headache type     Disposition: Admit  This note was prepared with assistance of Dragon voice recognition software. Occasional wrong-word or sound-a-like substitutions may have occurred due to the inherent limitations of voice recognition software.      Ladelle Teodoro, Gwenyth Allegra, MD 01/19/20 [phone]

## 2020-01-20 LAB — URINE CULTURE: Culture: <10000 — AB

## 2020-09-13 ENCOUNTER — Other Ambulatory Visit: Payer: Self-pay

## 2020-09-13 ENCOUNTER — Ambulatory Visit (INDEPENDENT_AMBULATORY_CARE_PROVIDER_SITE_OTHER): Payer: Medicaid Other | Admitting: Plastic Surgery

## 2020-09-13 ENCOUNTER — Encounter: Payer: Self-pay | Admitting: Plastic Surgery

## 2020-09-13 VITALS — BP 106/74 | HR 87 | Ht 62.0 in | Wt 224.0 lb

## 2020-09-13 DIAGNOSIS — N62 Hypertrophy of breast: Secondary | ICD-10-CM | POA: Diagnosis not present

## 2020-09-13 NOTE — Progress Notes (Signed)
Referring Provider Mancel Bale, PA-C [address] [place],  [place] [address]   CC:  Chief Complaint  Patient presents with   consult      Emma Chambers is an 30 y.o. female.  HPI: Patient presents to discuss breast reduction.  She has had years of back pain, neck pain and shoulder grooving related to her large breasts.  She tried over-the-counter medications, warm packs, cold packs and supportive bras with little relief.  She also gets rashes beneath her breast that been refractory to over-the-counter treatments.  She has a maternal grandmother who had breast cancer but no previous breast biopsies or procedures herself.  She does not smoke cigarettes and is not a diabetic.  She has tried physical therapy for her back pain for several weeks with little relief.  She is currently a triple D and wants to be around a C cup.  Allergies  Allergen Reactions   Amoxicillin Nausea And Vomiting, Other (See Comments) and Rash    Tics & twitches Has patient had a PCN reaction causing immediate rash, facial/tongue/throat swelling, SOB or lightheadedness with hypotension: Yes Has patient had a PCN reaction causing severe rash involving mucus membranes or skin necrosis: No Has patient had a PCN reaction that required hospitalization No Has patient had a PCN reaction occurring within the last 10 years: Yes If all of the above answers are "NO", then may proceed with Cephalosporin use.   Contrast Media [Iodinated Diagnostic Agents] Hives   Hydromorphone Hcl Rash   Dilaudid [Hydromorphone Hcl] Itching    Outpatient Encounter Medications as of 09/13/2020  Medication Sig   baclofen (LIORESAL) 10 MG tablet Take 10 mg by mouth 3 (three) times daily as needed (headaches).    No facility-administered encounter medications on file as of 09/13/2020.     Past Medical History:  Diagnosis Date   Anemia    Gallstones    H. pylori infection    Headache(784.0)    chronic   History of  gestational hypertension 12/01/2017   -baseline labs at [facility] - Start baby ASA    Ref. Range 12/01/2017 09:57 Protein/Creat Ratio Latest Ref Range: 0 - 200 mg/g creat 135   Ref. Range 12/01/2017 09:23 AST Latest Ref Range: 0 - 40 IU/L 14 ALT Latest Ref Range: 0 - 32 IU/L 11   Ref. 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   Needs prophylaxis at 36 weeks   Kidney stones    Obesity in pregnancy 03/24/2018   Ovarian cyst    Pregnancy induced hypertension    UTI (lower urinary tract infection)     Past Surgical History:  Procedure Laterality Date   CHOLECYSTECTOMY N/A 07/27/2012   Procedure: LAPAROSCOPIC CHOLECYSTECTOMY WITH INTRAOPERATIVE CHOLANGIOGRAM;  Surgeon: Madilyn Hook, DO;  Location: [facility];  Service: General;  Laterality: N/A;   DIAGNOSTIC LAPAROSCOPY WITH REMOVAL OF ECTOPIC PREGNANCY N/A 12/27/2014   Procedure: DIAGNOSTIC LAPAROSCOPY, Right Salpingectomy with removal ECTOPIC PREGNANCY;  Surgeon: [name], MD;  Location: [facility];  Service: Gynecology;  Laterality: N/A;   LAPAROSCOPIC APPENDECTOMY N/A 12/09/2012   Procedure: APPENDECTOMY LAPAROSCOPIC;  Surgeon: Gwenyth Ober, MD;  Location: [place];  Service: General;  Laterality: N/A;   LAPAROSCOPIC TUBAL LIGATION Bilateral 11/04/2018   Procedure: LAPAROSCOPIC TUBAL LIGATION with Flishie Clip Left;  Surgeon: Aletha Halim, MD;  Location: [place];  Service: Gynecology;  Laterality:  Bilateral;    Family History  Problem Relation Age of Onset   Asthma Mother    Hypertension Mother    Parkinsonism Mother    Asthma Brother    Diabetes Maternal Grandmother    Hypertension Maternal Grandmother    Stroke Maternal Grandmother    Heart disease Maternal Grandmother    Lupus Maternal Aunt    Heart disease Maternal Grandfather    Anesthesia problems Neg Hx    Malignant hyperthermia Neg Hx    Pseudochol deficiency Neg Hx    Hypotension Neg Hx      Social History   Social History Narrative   Not on file     Review of Systems General: Denies fevers, chills, weight loss CV: Denies chest pain, shortness of breath, palpitations  Physical Exam Vitals with BMI 09/13/2020 01/19/2020 01/19/2020  Height 5\' 2"  - -  Weight 224 lbs - -  BMI 41.32 - -  Systolic 440 102 725  Diastolic 74 66 99  Pulse 87 91 96  Some encounter information is confidential and restricted. Go to Review Flowsheets activity to see all data.    General:  No acute distress,  Alert and oriented, Non-Toxic, Normal speech and affect Breast: She has grade 3 ptosis.  Sternal notch to nipple is 37 cm bilaterally.  Nipple to fold is 14 cm on the right and 15 cm on the left.  No obvious scars or masses.  Assessment/Plan The patient has bilateral symptomatic macromastia.  She is a good candidate for a breast reduction.  She is interested in pursuing surgical treatment.  She has tried supportive garments and fitted bras with no relief.  The details of breast reduction surgery were discussed.  I explained the procedure in detail along the with the expected scars.  The risks were discussed in detail and include bleeding, infection, damage to surrounding structures, need for additional procedures, nipple loss, change in nipple sensation, persistent pain, contour irregularities and asymmetries.  I explained that breast feeding is often not possible after breast reduction surgery.  We discussed the expected postoperative course with an overall recovery period of about 1 month.  She demonstrated full understanding of all risks.  We discussed her personal risk factors that include her BMI.  The patient is interested in pursuing surgical treatment.  I anticipate approximately 800g of tissue removed from each side.   [name] 09/13/2020, 10:19 AM

## 2020-10-05 ENCOUNTER — Other Ambulatory Visit: Payer: Self-pay

## 2020-10-05 ENCOUNTER — Ambulatory Visit (INDEPENDENT_AMBULATORY_CARE_PROVIDER_SITE_OTHER): Payer: Medicaid Other

## 2020-10-05 VITALS — BP 122/85 | HR 99 | Ht 62.0 in | Wt 224.6 lb

## 2020-10-05 DIAGNOSIS — N62 Hypertrophy of breast: Secondary | ICD-10-CM | POA: Diagnosis not present

## 2020-10-17 NOTE — Patient Instructions (Signed)
Patient will call for any concerns or changes

## 2020-10-17 NOTE — Progress Notes (Signed)
10/05/20     Patient ID: Emma Chambers, female    DOB: 02/19/1991, 30 y.o.   MRN: [id number]  Chief Complaint  Patient presents with   Pre-op Exam    No diagnosis found. Bilateral Macromastia   History of Present Illness: Emma Chambers is a 30 y.o.  female  with a history of macromastia.  She presents for preoperative evaluation for upcoming procedure, Bilateral Breast Reduction , scheduled for 11/06/20 with Dr.  Claudia Desanctis  The patient has not had problems with anesthesia.   Summary of Previous Visit: consult for bilateral breast reduction  Estimated excess breast tissue to be removed at time of surgery: 800gm on the left and 800gm on the right.  Job: Advertising copywriter  PMH Significant for:  BMI = 40.96 Anemia Gallstones Gestational HTN Chronic headaches   Past Medical History: Allergies: Allergies  Allergen Reactions   Amoxicillin Nausea And Vomiting, Other (See Comments) and Rash    Tics & twitches Has patient had a PCN reaction causing immediate rash, facial/tongue/throat swelling, SOB or lightheadedness with hypotension: Yes Has patient had a PCN reaction causing severe rash involving mucus membranes or skin necrosis: No Has patient had a PCN reaction that required hospitalization No Has patient had a PCN reaction occurring within the last 10 years: Yes If all of the above answers are "NO", then may proceed with Cephalosporin use.   Contrast Media [Iodinated Diagnostic Agents] Hives   Hydromorphone Hcl Rash   Dilaudid [Hydromorphone Hcl] Itching    Current Medications: No current outpatient medications on file.  Past Medical Problems: Past Medical History:  Diagnosis Date   Anemia    Gallstones    H. pylori infection    Headache(784.0)    chronic   History of gestational hypertension 12/01/2017   -baseline labs at [facility] - Start baby ASA    Ref. Range 12/01/2017 09:57 Protein/Creat Ratio Latest Ref Range: 0 - 200 mg/g creat 135   Ref. Range 12/01/2017 09:23 AST Latest  Ref Range: 0 - 40 IU/L 14 ALT Latest Ref Range: 0 - 32 IU/L 11   Ref. 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   Needs prophylaxis at 36 weeks   Kidney stones    Obesity in pregnancy 03/24/2018   Ovarian cyst    Pregnancy induced hypertension    UTI (lower urinary tract infection)     Past Surgical History: Past Surgical History:  Procedure Laterality Date   CHOLECYSTECTOMY N/A 07/27/2012   Procedure: LAPAROSCOPIC CHOLECYSTECTOMY WITH INTRAOPERATIVE CHOLANGIOGRAM;  Surgeon: Madilyn Hook, DO;  Location: [facility];  Service: General;  Laterality: N/A;   DIAGNOSTIC LAPAROSCOPY WITH REMOVAL OF ECTOPIC PREGNANCY N/A 12/27/2014   Procedure: DIAGNOSTIC LAPAROSCOPY, Right Salpingectomy with removal ECTOPIC PREGNANCY;  Surgeon: [name], MD;  Location: [facility];  Service: Gynecology;  Laterality: N/A;   LAPAROSCOPIC APPENDECTOMY N/A 12/09/2012   Procedure: APPENDECTOMY LAPAROSCOPIC;  Surgeon: Gwenyth Ober, MD;  Location: [place];  Service: General;  Laterality: N/A;   LAPAROSCOPIC TUBAL LIGATION Bilateral 11/04/2018   Procedure: LAPAROSCOPIC TUBAL LIGATION with Flishie Clip Left;  Surgeon: Aletha Halim, MD;  Location: [place];  Service: Gynecology;  Laterality: Bilateral;    Social History: Social History   Socioeconomic History   Marital status: Single    Spouse name: Not on file   Number of children: Not on file   Years of education: Not on file  Highest education level: Not on file  Occupational History   Not on file  Tobacco Use   Smoking status: Never   Smokeless tobacco: Never   Tobacco comments:    2014  Vaping Use   Vaping Use: Never used  Substance and Sexual Activity   Alcohol use: Yes    Comment: social   Drug use: Not Currently    Types: Marijuana   Sexual activity: Yes    Birth control/protection: None    Comment: last IC- approx one month ago  Other Topics Concern    Not on file  Social History Narrative   Not on file   Social Determinants of Health   Financial Resource Strain: Not on file  Food Insecurity: Not on file  Transportation Needs: Not on file  Physical Activity: Not on file  Stress: Not on file  Social Connections: Not on file  Intimate Partner Violence: Not on file    Family History: Family History  Problem Relation Age of Onset   Asthma Mother    Hypertension Mother    Parkinsonism Mother    Asthma Brother    Diabetes Maternal Grandmother    Hypertension Maternal Grandmother    Stroke Maternal Grandmother    Heart disease Maternal Grandmother    Lupus Maternal Aunt    Heart disease Maternal Grandfather    Anesthesia problems Neg Hx    Malignant hyperthermia Neg Hx    Pseudochol deficiency Neg Hx    Hypotension Neg Hx     Review of Systems: ROS Patient states she has not had any recent anemia Denies the following: No asthma/sleep apnea / no supplemental oxygen use Does not smoke cigarettes- but does occasionally smoke THC No DM no cardiac hx- no DVT/PE no varicose veins/ no swelling feet/ankles- except for gestational HTN  No hx of anesthesia complications for herself or family  Physical Exam: Vital Signs BP 122/85 (BP Location: Left Arm, Patient Position: Sitting, Cuff Size: Large)   Pulse 99   Ht '5\' 2"'$  (1.575 m)   Wt 224 lb 9.6 oz (101.9 kg)   SpO2 99%   BMI 41.08 kg/m   Physical Exam  = WNL Constitutional:      General: Not in acute distress.    Appearance: Normal appearance. Not ill-appearing.  HENT:     Head: Normocephalic and atraumatic.  Eyes:     Pupils: Pupils are equal, round Neck:     Musculoskeletal: Normal range of motion.  Cardiovascular:     Rate and Rhythm: Normal rate    Pulses: Normal pulses.  Pulmonary:     Effort: Pulmonary effort is normal. No respiratory distress.  Abdominal:     General: Abdomen is flat. There is no distension.  Musculoskeletal: Normal range of motion.   Skin:    General: Skin is warm and dry.     Findings: No erythema or rash.  Neurological:     General: No focal deficit present.     Mental Status: Alert and oriented to person, place, and time. Mental status is at baseline.     Motor: No weakness.  Psychiatric:        Mood and Affect: Mood normal.        Behavior: Behavior normal.    Assessment/Plan: The patient is scheduled for bilateral breast reduction with Dr. Claudia Desanctis.  Risks, benefits, and alternatives of procedure discussed, questions answered and consent obtained.    Smoking Status: non-tobacco user/occasional THC consumption ; Counseling Given? Yes- suggested she  not use any THC & do not be exposed to passive smoke Last Mammogram: n/a; Results: n/a  Caprini Score: 6; Risk Factors include: , BMI = 40.96 , length of planned surgery, family Hx of lower extremity blood clots= maternal grandmother. Recommendation for mechanical SCD, pharmacological prophylaxis. Encourage early ambulation.   Pictures obtained: yes  Post-op Rx sent to pharmacy: Dr. Claudia Desanctis  Patient was provided with the breast reduction and General Surgical Risk consent document and Pain Medication Agreement prior to their appointment.  They had adequate time to read through the risk consent documents and Pain Medication Agreement. We also discussed them in person together during this preop appointment. All of their questions were answered to their satisfaction.  Recommended calling if they have any further questions.  Risk consent form and Pain Medication Agreement to be scanned into patient's chart.  The risk that can be encountered with breast reduction were discussed and include the following but not limited to these:  Breast asymmetry, fluid accumulation, firmness of the breast, inability to breast feed, loss of nipple or areola, skin loss, decrease or no nipple sensation, fat necrosis of the breast tissue, bleeding, infection, healing delay.  There are risks of  anesthesia, changes to skin sensation and injury to nerves or blood vessels.  The muscle can be temporarily or permanently injured.  You may have an allergic reaction to tape, suture, glue, blood products which can result in skin discoloration, swelling, pain, skin lesions, poor healing.  Any of these can lead to the need for revisonal surgery or stage procedures.  A reduction has potential to interfere with diagnostic procedures.  Nipple or breast piercing can increase risks of infection.  This procedure is best done when the breast is fully developed.  Changes in the breast will continue to occur over time.  Pregnancy can alter the outcomes of previous breast reduction surgery, weight gain and weigh loss can also effect the long term appearance.   Lipo?  Electronically signed by: [place], RN 10/17/2020 9:39 AM

## 2020-10-27 ENCOUNTER — Encounter (HOSPITAL_BASED_OUTPATIENT_CLINIC_OR_DEPARTMENT_OTHER): Payer: Self-pay | Admitting: Plastic Surgery

## 2020-10-27 ENCOUNTER — Other Ambulatory Visit: Payer: Self-pay

## 2020-11-06 ENCOUNTER — Encounter (HOSPITAL_BASED_OUTPATIENT_CLINIC_OR_DEPARTMENT_OTHER): Payer: Self-pay | Admitting: Plastic Surgery

## 2020-11-06 ENCOUNTER — Encounter (HOSPITAL_BASED_OUTPATIENT_CLINIC_OR_DEPARTMENT_OTHER)
Admission: RE | Disposition: A | Discharge status: Home / Self Care | Payer: Self-pay | Source: Home / Self Care | Attending: Plastic Surgery

## 2020-11-06 ENCOUNTER — Other Ambulatory Visit: Payer: Self-pay

## 2020-11-06 ENCOUNTER — Ambulatory Visit (HOSPITAL_BASED_OUTPATIENT_CLINIC_OR_DEPARTMENT_OTHER)
Admission: RE | Admit: 2020-11-06 | Discharge: 2020-11-06 | Disposition: A | Discharge status: Home / Self Care | Payer: Medicaid Other | Attending: Plastic Surgery | Admitting: Plastic Surgery

## 2020-11-06 ENCOUNTER — Ambulatory Visit (HOSPITAL_BASED_OUTPATIENT_CLINIC_OR_DEPARTMENT_OTHER): Payer: Medicaid Other | Admitting: Anesthesiology

## 2020-11-06 DIAGNOSIS — M4004 Postural kyphosis, thoracic region: Secondary | ICD-10-CM

## 2020-11-06 DIAGNOSIS — M545 Low back pain, unspecified: Secondary | ICD-10-CM

## 2020-11-06 DIAGNOSIS — Z885 Allergy status to narcotic agent status: Secondary | ICD-10-CM | POA: Insufficient documentation

## 2020-11-06 DIAGNOSIS — Z880 Allergy status to penicillin: Secondary | ICD-10-CM | POA: Diagnosis not present

## 2020-11-06 DIAGNOSIS — D241 Benign neoplasm of right breast: Secondary | ICD-10-CM | POA: Insufficient documentation

## 2020-11-06 DIAGNOSIS — Z8619 Personal history of other infectious and parasitic diseases: Secondary | ICD-10-CM | POA: Insufficient documentation

## 2020-11-06 DIAGNOSIS — Z8249 Family history of ischemic heart disease and other diseases of the circulatory system: Secondary | ICD-10-CM | POA: Insufficient documentation

## 2020-11-06 DIAGNOSIS — N62 Hypertrophy of breast: Secondary | ICD-10-CM | POA: Diagnosis not present

## 2020-11-06 DIAGNOSIS — Z91041 Radiographic dye allergy status: Secondary | ICD-10-CM | POA: Insufficient documentation

## 2020-11-06 DIAGNOSIS — D242 Benign neoplasm of left breast: Secondary | ICD-10-CM | POA: Insufficient documentation

## 2020-11-06 DIAGNOSIS — Z9049 Acquired absence of other specified parts of digestive tract: Secondary | ICD-10-CM | POA: Diagnosis not present

## 2020-11-06 DIAGNOSIS — Z8759 Personal history of other complications of pregnancy, childbirth and the puerperium: Secondary | ICD-10-CM | POA: Insufficient documentation

## 2020-11-06 DIAGNOSIS — M546 Pain in thoracic spine: Secondary | ICD-10-CM

## 2020-11-06 HISTORY — PX: BREAST REDUCTION SURGERY: SHX8

## 2020-11-06 LAB — POCT PREGNANCY, URINE: Preg Test, Ur: NEGATIVE

## 2020-11-06 SURGERY — MAMMOPLASTY, REDUCTION
Anesthesia: General | Site: Breast | Laterality: Bilateral

## 2020-11-06 MED ORDER — HYDROCODONE-ACETAMINOPHEN 5-325 MG PO TABS: 1.0000 | ORAL_TABLET | Freq: Four times a day (QID) | ORAL | 0 refills | Status: AC | PRN

## 2020-11-06 MED ORDER — FENTANYL CITRATE (PF) 100 MCG/2ML IJ SOLN
INTRAMUSCULAR | Status: DC | PRN
  Administered 2020-11-06 (×2): 100 ug via INTRAVENOUS

## 2020-11-06 MED ORDER — ONDANSETRON HCL 4 MG/2ML IJ SOLN
INTRAMUSCULAR | Status: AC
  Filled 2020-11-06: qty 2

## 2020-11-06 MED ORDER — METOCLOPRAMIDE HCL 5 MG/ML IJ SOLN
INTRAMUSCULAR | Status: AC
  Filled 2020-11-06: qty 2

## 2020-11-06 MED ORDER — 0.9 % SODIUM CHLORIDE (POUR BTL) OPTIME
TOPICAL | Status: DC | PRN
  Administered 2020-11-06: 1000 mL

## 2020-11-06 MED ORDER — ONDANSETRON HCL 4 MG/2ML IJ SOLN
INTRAMUSCULAR | Status: DC | PRN
  Administered 2020-11-06: 4 mg via INTRAVENOUS

## 2020-11-06 MED ORDER — DEXAMETHASONE SODIUM PHOSPHATE 4 MG/ML IJ SOLN
INTRAMUSCULAR | Status: DC | PRN
  Administered 2020-11-06: 10 mg via INTRAVENOUS

## 2020-11-06 MED ORDER — PROPOFOL 10 MG/ML IV BOLUS
INTRAVENOUS | Status: DC | PRN
  Administered 2020-11-06: 150 mg via INTRAVENOUS

## 2020-11-06 MED ORDER — PROPOFOL 10 MG/ML IV BOLUS
INTRAVENOUS | Status: AC
  Filled 2020-11-06: qty 20

## 2020-11-06 MED ORDER — DEXAMETHASONE SODIUM PHOSPHATE 10 MG/ML IJ SOLN
INTRAMUSCULAR | Status: AC
  Filled 2020-11-06: qty 1

## 2020-11-06 MED ORDER — METOCLOPRAMIDE HCL 5 MG/ML IJ SOLN
5.0000 mg | Freq: Once | INTRAMUSCULAR | Status: AC
  Administered 2020-11-06: 5 mg via INTRAVENOUS

## 2020-11-06 MED ORDER — LACTATED RINGERS IV SOLN: INTRAVENOUS | Status: DC | PRN

## 2020-11-06 MED ORDER — CLINDAMYCIN PHOSPHATE 900 MG/50ML IV SOLN
INTRAVENOUS | Status: AC
  Filled 2020-11-06: qty 50

## 2020-11-06 MED ORDER — MIDAZOLAM HCL 5 MG/5ML IJ SOLN
INTRAMUSCULAR | Status: DC | PRN
  Administered 2020-11-06: 2 mg via INTRAVENOUS

## 2020-11-06 MED ORDER — FENTANYL CITRATE (PF) 100 MCG/2ML IJ SOLN
INTRAMUSCULAR | Status: AC
  Filled 2020-11-06: qty 2

## 2020-11-06 MED ORDER — ROCURONIUM BROMIDE 100 MG/10ML IV SOLN
INTRAVENOUS | Status: DC | PRN
  Administered 2020-11-06: 50 mg via INTRAVENOUS

## 2020-11-06 MED ORDER — LACTATED RINGERS IV SOLN: INTRAVENOUS | Status: DC

## 2020-11-06 MED ORDER — PROPOFOL 500 MG/50ML IV EMUL
INTRAVENOUS | Status: AC
  Filled 2020-11-06: qty 150

## 2020-11-06 MED ORDER — LIDOCAINE HCL (CARDIAC) PF 100 MG/5ML IV SOSY
PREFILLED_SYRINGE | INTRAVENOUS | Status: DC | PRN
  Administered 2020-11-06: 100 mg via INTRAVENOUS

## 2020-11-06 MED ORDER — PHENYLEPHRINE HCL (PRESSORS) 10 MG/ML IV SOLN
INTRAVENOUS | Status: DC | PRN
Start: 2020-11-06 — End: 2020-11-06
  Administered 2020-11-06: 80 ug via INTRAVENOUS
  Administered 2020-11-06 (×5): 120 ug via INTRAVENOUS

## 2020-11-06 MED ORDER — ONDANSETRON HCL 4 MG PO TABS: 4.0000 mg | ORAL_TABLET | Freq: Three times a day (TID) | ORAL | 0 refills | Status: AC | PRN

## 2020-11-06 MED ORDER — CLINDAMYCIN PHOSPHATE 900 MG/50ML IV SOLN
900.0000 mg | INTRAVENOUS | Status: AC
  Administered 2020-11-06: 900 mg via INTRAVENOUS

## 2020-11-06 MED ORDER — SUGAMMADEX SODIUM 500 MG/5ML IV SOLN
INTRAVENOUS | Status: DC | PRN
  Administered 2020-11-06: 300 mg via INTRAVENOUS

## 2020-11-06 MED ORDER — FENTANYL CITRATE (PF) 100 MCG/2ML IJ SOLN
25.0000 ug | INTRAMUSCULAR | Status: DC | PRN
  Administered 2020-11-06 (×2): 50 ug via INTRAVENOUS

## 2020-11-06 MED ORDER — MIDAZOLAM HCL 2 MG/2ML IJ SOLN
INTRAMUSCULAR | Status: AC
  Filled 2020-11-06: qty 2

## 2020-11-06 MED ORDER — PROPOFOL 500 MG/50ML IV EMUL
INTRAVENOUS | Status: DC | PRN
Start: 2020-11-06 — End: 2020-11-06
  Administered 2020-11-06: 25 ug/kg/min via INTRAVENOUS

## 2020-11-06 SURGICAL SUPPLY — 71 items
APL PRP STRL LF DISP 70% ISPRP (MISCELLANEOUS) ×2
APL SKNCLS STERI-STRIP NONHPOA (GAUZE/BANDAGES/DRESSINGS) ×2
BAG DECANTER FOR FLEXI CONT (MISCELLANEOUS) IMPLANT
BENZOIN TINCTURE PRP APPL 2/3 (GAUZE/BANDAGES/DRESSINGS) ×4 IMPLANT
BLADE SURG 10 STRL SS (BLADE) ×4 IMPLANT
BLADE SURG 15 STRL LF DISP TIS (BLADE) IMPLANT
BLADE SURG 15 STRL SS (BLADE)
BNDG CMPR MED 10X6 ELC LF (GAUZE/BANDAGES/DRESSINGS) ×1
BNDG ELASTIC 6X10 VLCR STRL LF (GAUZE/BANDAGES/DRESSINGS) ×2 IMPLANT
CANISTER SUCT 1200ML W/VALVE (MISCELLANEOUS) ×2 IMPLANT
CHLORAPREP W/TINT 26 (MISCELLANEOUS) ×4 IMPLANT
CLIP TI MEDIUM 6 (CLIP) IMPLANT
COVER BACK TABLE 60X90IN (DRAPES) ×2 IMPLANT
COVER MAYO STAND STRL (DRAPES) ×2 IMPLANT
DECANTER SPIKE VIAL GLASS SM (MISCELLANEOUS) IMPLANT
DRAIN CHANNEL 15F RND FF W/TCR (WOUND CARE) IMPLANT
DRAIN PENROSE 1/2X12 LTX STRL (WOUND CARE) ×2 IMPLANT
DRAPE LAPAROSCOPIC ABDOMINAL (DRAPES) ×2 IMPLANT
DRAPE UTILITY XL STRL (DRAPES) ×2 IMPLANT
DRSG PAD ABDOMINAL 8X10 ST (GAUZE/BANDAGES/DRESSINGS) ×6 IMPLANT
ELECT COATED BLADE 2.86 ST (ELECTRODE) ×2 IMPLANT
ELECT REM PT RETURN 9FT ADLT (ELECTROSURGICAL) ×2
ELECTRODE REM PT RTRN 9FT ADLT (ELECTROSURGICAL) ×1 IMPLANT
EVACUATOR SILICONE 100CC (DRAIN) IMPLANT
GAUZE SPONGE 4X4 12PLY STRL (GAUZE/BANDAGES/DRESSINGS) ×4 IMPLANT
GAUZE XEROFORM 5X9 LF (GAUZE/BANDAGES/DRESSINGS) IMPLANT
GLOVE SRG 8 PF TXTR STRL LF DI (GLOVE) IMPLANT
GLOVE SURG ENC MOIS LTX SZ6.5 (GLOVE) IMPLANT
GLOVE SURG ENC MOIS LTX SZ7.5 (GLOVE) ×4 IMPLANT
GLOVE SURG ENC MOIS LTX SZ8 (GLOVE) ×2 IMPLANT
GLOVE SURG ENC TEXT LTX SZ7.5 (GLOVE) ×4 IMPLANT
GLOVE SURG LTX SZ6.5 (GLOVE) IMPLANT
GLOVE SURG UNDER POLY LF SZ8 (GLOVE)
GOWN STRL REUS W/ TWL LRG LVL3 (GOWN DISPOSABLE) ×2 IMPLANT
GOWN STRL REUS W/TWL 2XL LVL3 (GOWN DISPOSABLE) ×2 IMPLANT
GOWN STRL REUS W/TWL LRG LVL3 (GOWN DISPOSABLE) ×4
MARKER SKIN DUAL TIP RULER LAB (MISCELLANEOUS) ×2 IMPLANT
NDL SAFETY ECLIPSE 18X1.5 (NEEDLE) ×1 IMPLANT
NEEDLE FILTER BLUNT 18X 1/2SAF (NEEDLE) ×1
NEEDLE FILTER BLUNT 18X1 1/2 (NEEDLE) ×1 IMPLANT
NEEDLE HYPO 18GX1.5 SHARP (NEEDLE) ×2
NEEDLE HYPO 25X1 1.5 SAFETY (NEEDLE) IMPLANT
NEEDLE SPNL 18GX3.5 QUINCKE PK (NEEDLE) ×2 IMPLANT
NS IRRIG 1000ML POUR BTL (IV SOLUTION) ×2 IMPLANT
PACK BASIN DAY SURGERY FS (CUSTOM PROCEDURE TRAY) ×2 IMPLANT
PENCIL SMOKE EVACUATOR (MISCELLANEOUS) ×2 IMPLANT
PIN SAFETY STERILE (MISCELLANEOUS) IMPLANT
SHEET MEDIUM DRAPE 40X70 STRL (DRAPES) ×4 IMPLANT
SLEEVE SCD COMPRESS KNEE MED (STOCKING) ×2 IMPLANT
SPONGE T-LAP 18X18 ~~LOC~~+RFID (SPONGE) ×6 IMPLANT
STAPLER INSORB 30 2030 C-SECTI (MISCELLANEOUS) ×4 IMPLANT
STAPLER VISISTAT 35W (STAPLE) ×2 IMPLANT
STRIP SUTURE WOUND CLOSURE 1/2 (MISCELLANEOUS) ×6 IMPLANT
SUT CHROMIC 4 0 PS 2 18 (SUTURE) IMPLANT
SUT ETHILON 2 0 FS 18 (SUTURE) IMPLANT
SUT ETHILON 3 0 PS 1 (SUTURE) IMPLANT
SUT MNCRL AB 4-0 PS2 18 (SUTURE) ×8 IMPLANT
SUT PDS 3-0 CT2 (SUTURE) ×8
SUT PDS II 3-0 CT2 27 ABS (SUTURE) ×4 IMPLANT
SUT VIC AB 3-0 PS1 18 (SUTURE)
SUT VIC AB 3-0 PS1 18XBRD (SUTURE) IMPLANT
SUT VLOC 180 P-14 24 (SUTURE) ×6 IMPLANT
SYR 50ML LL SCALE MARK (SYRINGE) ×2 IMPLANT
SYR BULB IRRIG 60ML STRL (SYRINGE) ×2 IMPLANT
SYR CONTROL 10ML LL (SYRINGE) IMPLANT
TAPE MEASURE VINYL STERILE (MISCELLANEOUS) IMPLANT
TOWEL GREEN STERILE FF (TOWEL DISPOSABLE) ×4 IMPLANT
TUBE CONNECTING 20X1/4 (TUBING) ×2 IMPLANT
TUBING INFILTRATION IT-10001 (TUBING) ×2 IMPLANT
UNDERPAD 30X36 HEAVY ABSORB (UNDERPADS AND DIAPERS) ×4 IMPLANT
YANKAUER SUCT BULB TIP NO VENT (SUCTIONS) ×2 IMPLANT

## 2020-11-06 NOTE — Brief Op Note (Signed)
11/06/2020  11:14 AM  PATIENT:  Emma Chambers  30 y.o. female  PRE-OPERATIVE DIAGNOSIS:  macromastia  POST-OPERATIVE DIAGNOSIS:  macromastia  PROCEDURE:  Procedure(s): MAMMARY REDUCTION  (BREAST) (Bilateral)  SURGEON:  Surgeon(s) and Role:    * Josmar Messimer, Steffanie Dunn, MD - Primary  PHYSICIAN ASSISTANT: Software engineer, PA  ASSISTANTS: none   ANESTHESIA:   general  EBL:  25 mL   BLOOD ADMINISTERED:none  DRAINS: Penrose drain in the bilat breast    LOCAL MEDICATIONS USED:  MARCAINE     SPECIMEN:  Source of Specimen:  bilat breast  DISPOSITION OF SPECIMEN:  PATHOLOGY  COUNTS:  YES  TOURNIQUET:  * No tourniquets in log *  DICTATION: .Dragon Dictation  PLAN OF CARE: Discharge to home after PACU  PATIENT DISPOSITION:  PACU - hemodynamically stable.   Delay start of Pharmacological VTE agent (>24hrs) due to surgical blood loss or risk of bleeding: not applicable

## 2020-11-06 NOTE — Op Note (Signed)
Operative Note   DATE OF OPERATION: 11/06/2020  LOCATION: [facility]   SURGICAL DEPARTMENT: Plastic Surgery  PREOPERATIVE DIAGNOSES: Bilateral symptomatic macromastia.  POSTOPERATIVE DIAGNOSES:  same  PROCEDURE: Bilateral breast reduction with superomedial pedicle.  SURGEON: Talmadge Coventry, MD  ASSISTANT: Verdie Shire, PA  ANESTHESIA: General.  COMPLICATIONS: None.   INDICATIONS FOR PROCEDURE:  The patient, Emma Chambers is a 30 y.o. female born on 05-06-1990, is here for treatment of bilateral symptomatic macromastia. MRN: [id number]  CONSENT:  Informed consent was obtained directly from the patient. Risks, benefits and alternatives were fully discussed. Specific risks including but not limited to bleeding, infection, hematoma, seroma, scarring, pain, infection, contracture, asymmetry, wound healing problems, and need for further surgery were all discussed. The patient did have an ample opportunity to have questions answered to satisfaction.   DESCRIPTION OF PROCEDURE:  The patient was marked preoperatively for a Wise pattern skin excision.  The patient was taken to the operating room. SCDs were placed and antibiotics were given. General anesthesia was administered.The patient's operative site was prepped and draped in a sterile fashion. A time out was performed and all information was confirmed to be correct.  Right Breast: The breast was infiltrated with tumescent solution to help with hemostasis.  The nipple was marked with a cookie cutter.  A superomedial pedicle was drawn out with the base of at least 8 cm in size.  A breast tourniquet was then applied and the pedicle was de-epithelialized.  Breast tourniquet was then let down and all incisions were made with a 10 blade.  The pedicle was then isolated down to the chest wall with cautery and the excision was performed removing tissue primarily inferiorly and laterally.  Hemostasis was obtained and the wound  was stapled closed.  Left breast:  The breast was infiltrated with tumescent solution to help with hemostasis.  The nipple was marked with a cookie cutter.  A superomedial pedicle was drawn out with the base of at least 8 cm in size.  A breast tourniquet was then applied and the pedicle was de-epithelialized.  Breast tourniquet was then let down and all incisions were made with a 10 blade.  The pedicle was then isolated down to the chest wall with cautery and the excision was performed removing tissue primarily inferiorly and laterally.  Hemostasis was obtained and the wound was stapled closed.  Patient was then set up to check for size and symmetry.  Minor modifications were made.  This resulted in a total of 1179g removed from the right side and 1191g removed from the left side.  The inframammary incision was closed with a combination of buried in-sorb staples and a running 3-0 Quill suture.  The vertical and periareolar limbs were closed with interrupted buried 4-0 Monocryl and a running 4-0 Quill suture.  Steri-Strips were then applied along with a soft dressing and Ace wrap.  The patient tolerated the procedure well.  There were no complications. The patient was allowed to wake from anesthesia, extubated and taken to the recovery room in satisfactory condition.  I was present for the entire procedure.

## 2020-11-06 NOTE — Anesthesia Procedure Notes (Signed)
Procedure Name: Intubation Date/Time: 11/06/2020 10:39 AM Performed by: Verita Lamb, CRNA Pre-anesthesia Checklist: Patient identified, Emergency Drugs available, Suction available and Patient being monitored Patient Re-evaluated:Patient Re-evaluated prior to induction Oxygen Delivery Method: Circle system utilized Preoxygenation: Pre-oxygenation with 100% oxygen Induction Type: IV induction Ventilation: Mask ventilation without difficulty Laryngoscope Size: Mac and 3 Tube type: Oral Number of attempts: 1 Airway Equipment and Method: Stylet and Oral airway Placement Confirmation: ETT inserted through vocal cords under direct vision, positive ETCO2, breath sounds checked- equal and bilateral and CO2 detector Secured at: 23 cm Tube secured with: Tape Dental Injury: Teeth and Oropharynx as per pre-operative assessment

## 2020-11-06 NOTE — Interval H&P Note (Signed)
History and Physical Interval Note:  11/06/2020 7:37 AM  Emma Chambers  has presented today for surgery, with the diagnosis of macromastia.  The various methods of treatment have been discussed with the patient and family. After consideration of risks, benefits and other options for treatment, the patient has consented to  Procedure(s) with comments: MAMMARY REDUCTION  (BREAST) (Bilateral) - 2 hours as a surgical intervention.  The patient's history has been reviewed, patient examined, no change in status, stable for surgery.  I have reviewed the patient's chart and labs.  Questions were answered to the patient's satisfaction.     [name]

## 2020-11-06 NOTE — Anesthesia Preprocedure Evaluation (Addendum)
Anesthesia Evaluation  Patient identified by MRN, date of birth, ID band Patient awake    Reviewed: Allergy & Precautions, NPO status , Patient's Chart, lab work & pertinent test results  Airway Mallampati: II  TM Distance: >3 FB     Dental   Pulmonary neg pulmonary ROS,    breath sounds clear to auscultation       Cardiovascular hypertension,  Rhythm:Regular Rate:Normal     Neuro/Psych  Headaches,    GI/Hepatic Neg liver ROS, History noted Dr. Nyoka Cowden   Endo/Other    Renal/GU Renal disease     Musculoskeletal   Abdominal   Peds  Hematology  (+) anemia ,   Anesthesia Other Findings   Reproductive/Obstetrics                           Anesthesia Physical Anesthesia Plan  ASA: 3  Anesthesia Plan: General   Post-op Pain Management:    Induction: Intravenous  PONV Risk Score and Plan: 3 and Ondansetron, Dexamethasone and Midazolam  Airway Management Planned: Oral ETT  Additional Equipment:   Intra-op Plan:   Post-operative Plan: Extubation in OR  Informed Consent: I have reviewed the patients History and Physical, chart, labs and discussed the procedure including the risks, benefits and alternatives for the proposed anesthesia with the patient or authorized representative who has indicated his/her understanding and acceptance.     Dental advisory given  Plan Discussed with: CRNA and Anesthesiologist  Anesthesia Plan Comments:         Anesthesia Quick Evaluation

## 2020-11-06 NOTE — Transfer of Care (Signed)
Immediate Anesthesia Transfer of Care Note  Patient: Emma Chambers  Procedure(s) Performed: MAMMARY REDUCTION  (BREAST) (Bilateral: Breast)  Patient Location: PACU  Anesthesia Type:General  Level of Consciousness: awake, alert  and oriented  Airway & Oxygen Therapy: Patient Spontanous Breathing and Patient connected to face mask oxygen  Post-op Assessment: Report given to RN and Post -op Vital signs reviewed and stable  Post vital signs: Reviewed and stable  Last Vitals:  Vitals Value Taken Time  BP    Temp    Pulse    Resp    SpO2      Last Pain:  Vitals:   11/06/20 0723  TempSrc: Oral  PainSc: 0-No pain         Complications: No notable events documented.

## 2020-11-06 NOTE — Discharge Instructions (Addendum)
Activity As tolerated: NO showers for 3 days. Keep ACE wrap on breasts until then. After showering, put ACE wrap back on, this is important for compression. NO driving while in pain, taking pain medication or if you are unable to safely react to traffic. No heavy activities Take Pain medication (Norco) as needed for severe pain. Otherwise, you can use ibuprofen or tylenol as needed. Avoid more than 3,000 mg of tylenol in 24 hours. Norco has '325mg'$  of tylenol per dose.  Diet: Regular. Drink plenty of fluids and eat healthy, high protein, low carbs.  Wound Care: Keep dressing clean & dry. You may change bandages after showering if you continue to notice some drainage. You can reuse bandages if they are not dirty/soiled. You have a drain present, which allows fluid to drain from the breast. The drainage will be bloody initially and change to a pink, yellow, orange color over the next week. Cover the drain with gauze, this may need to be changed daily.  Special Instructions: Call Doctor if any unusual problems occur such as pain, excessive Bleeding, unrelieved Nausea/vomiting, Fever &/or chills  Follow-up appointment: Scheduled for next week.   Post Anesthesia Home Care Instructions  Activity: Get plenty of rest for the remainder of the day. A responsible individual must stay with you for 24 hours following the procedure.  For the next 24 hours, DO NOT: -Drive a car -Paediatric nurse -Drink alcoholic beverages -Take any medication unless instructed by your physician -Make any legal decisions or sign important papers.  Meals: Start with liquid foods such as gelatin or soup. Progress to regular foods as tolerated. Avoid greasy, spicy, heavy foods. If nausea and/or vomiting occur, drink only clear liquids until the nausea and/or vomiting subsides. Call your physician if vomiting continues.  Special Instructions/Symptoms: Your throat may feel dry or sore from the anesthesia or the breathing  tube placed in your throat during surgery. If this causes discomfort, gargle with warm salt water. The discomfort should disappear within 24 hours.  If you had a scopolamine patch placed behind your ear for the management of post- operative nausea and/or vomiting:  1. The medication in the patch is effective for 72 hours, after which it should be removed.  Wrap patch in a tissue and discard in the trash. Wash hands thoroughly with soap and water. 2. You may remove the patch earlier than 72 hours if you experience unpleasant side effects which may include dry mouth, dizziness or visual disturbances. 3. Avoid touching the patch. Wash your hands with soap and water after contact with the patch.

## 2020-11-06 NOTE — H&P (Signed)
Subjective '[]'$ Expand by Default   Patient ID: Emma Chambers, female    DOB: 02-14-1991, 30 y.o.   MRN: [id number]      Chief Complaint  Patient presents with   Pre-op Exam      No diagnosis found. Bilateral Macromastia     History of Present Illness: Emma Chambers is a 30 y.o.  female  with a history of macromastia.  She presents for preoperative evaluation for upcoming procedure, Bilateral Breast Reduction , scheduled for 11/06/20 with Dr.  Claudia Desanctis   The patient has not had problems with anesthesia.    Summary of Previous Visit: consult for bilateral breast reduction   Estimated excess breast tissue to be removed at time of surgery: 800gm on the left and 800gm on the right.   Job: Advertising copywriter   PMH Significant for:  BMI = 40.96 Anemia Gallstones Gestational HTN Chronic headaches     Past Medical History: Allergies:      Allergies  Allergen Reactions   Amoxicillin Nausea And Vomiting, Other (See Comments) and Rash      Tics & twitches Has patient had a PCN reaction causing immediate rash, facial/tongue/throat swelling, SOB or lightheadedness with hypotension: Yes Has patient had a PCN reaction causing severe rash involving mucus membranes or skin necrosis: No Has patient had a PCN reaction that required hospitalization No Has patient had a PCN reaction occurring within the last 10 years: Yes If all of the above answers are "NO", then may proceed with Cephalosporin use.   Contrast Media [Iodinated Diagnostic Agents] Hives   Hydromorphone Hcl Rash   Dilaudid [Hydromorphone Hcl] Itching      Current Medications: No current outpatient medications on file.   Past Medical Problems:     Past Medical History:  Diagnosis Date   Anemia     Gallstones     H. pylori infection     Headache(784.0)      chronic   History of gestational hypertension 12/01/2017    -baseline labs at [facility] - Start baby ASA           Ref. Range 12/01/2017 09:57 Protein/Creat Ratio       Latest Ref Range: 0 - 200 mg/g creat          135            Ref. Range 12/01/2017 09:23 AST      Latest Ref Range: 0 - 40 IU/L           14 ALT     Latest Ref Range: 0 - 32 IU/L           11            Ref. 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    Needs prophylaxis at 36 weeks   Kidney stones     Obesity in pregnancy 03/24/2018   Ovarian cyst     Pregnancy induced hypertension     UTI (lower urinary tract infection)        Past Surgical History:      Past Surgical History:  Procedure Laterality Date   CHOLECYSTECTOMY N/A 07/27/2012    Procedure: LAPAROSCOPIC CHOLECYSTECTOMY WITH INTRAOPERATIVE CHOLANGIOGRAM;  Surgeon: Madilyn Hook, DO;  Location: [facility];  Service: General;  Laterality: N/A;  DIAGNOSTIC LAPAROSCOPY WITH REMOVAL OF ECTOPIC PREGNANCY N/A 12/27/2014    Procedure: DIAGNOSTIC LAPAROSCOPY, Right Salpingectomy with removal ECTOPIC PREGNANCY;  Surgeon: [name], MD;  Location: [facility];  Service: Gynecology;  Laterality: N/A;   LAPAROSCOPIC APPENDECTOMY N/A 12/09/2012    Procedure: APPENDECTOMY LAPAROSCOPIC;  Surgeon: Gwenyth Ober, MD;  Location: [facility];  Service: General;  Laterality: N/A;   LAPAROSCOPIC TUBAL LIGATION Bilateral 11/04/2018    Procedure: LAPAROSCOPIC TUBAL LIGATION with Flishie Clip Left;  Surgeon: Aletha Halim, MD;  Location: [facility];  Service: Gynecology;  Laterality: Bilateral;      Social History: Social History         Socioeconomic History   Marital status: Single      Spouse name: Not on file   Number of children: Not on file   Years of education: Not on file   Highest education level: Not on file  Occupational History   Not on file  Tobacco Use   Smoking status: Never   Smokeless tobacco: Never   Tobacco comments:      2014  Vaping Use   Vaping Use: Never used  Substance and Sexual Activity   Alcohol use: Yes       Comment: social   Drug use: Not Currently      Types: Marijuana   Sexual activity: Yes      Birth control/protection: None      Comment: last IC- approx one month ago  Other Topics Concern   Not on file  Social History Narrative   Not on file    Social Determinants of Health    Financial Resource Strain: Not on file  Food Insecurity: Not on file  Transportation Needs: Not on file  Physical Activity: Not on file  Stress: Not on file  Social Connections: Not on file  Intimate Partner Violence: Not on file      Family History:      Family History  Problem Relation Age of Onset   Asthma Mother     Hypertension Mother     Parkinsonism Mother     Asthma Brother     Diabetes Maternal Grandmother     Hypertension Maternal Grandmother     Stroke Maternal Grandmother     Heart disease Maternal Grandmother     Lupus Maternal Aunt     Heart disease Maternal Grandfather     Anesthesia problems Neg Hx     Malignant hyperthermia Neg Hx     Pseudochol deficiency Neg Hx     Hypotension Neg Hx        Review of Systems: ROS Patient states she has not had any recent anemia Denies the following: No asthma/sleep apnea / no supplemental oxygen use Does not smoke cigarettes- but does occasionally smoke THC No DM no cardiac hx- no DVT/PE no varicose veins/ no swelling feet/ankles- except for gestational HTN  No hx of anesthesia complications for herself or family   Physical Exam: Vital Signs BP 122/85 (BP Location: Left Arm, Patient Position: Sitting, Cuff Size: Large)   Pulse 99   Ht '5\' 2"'$  (1.575 m)   Wt 224 lb 9.6 oz (101.9 kg)   SpO2 99%   BMI 41.08 kg/m    Physical Exam  = WNL Constitutional:      General: Not in acute distress.    Appearance: Normal appearance. Not ill-appearing.  HENT:     Head: Normocephalic and atraumatic.  Eyes:     Pupils: Pupils  are equal, round Neck:     Musculoskeletal: Normal range of motion.  Cardiovascular:     Rate and Rhythm: Normal  rate    Pulses: Normal pulses.  Pulmonary:     Effort: Pulmonary effort is normal. No respiratory distress.  Abdominal:     General: Abdomen is flat. There is no distension.  Musculoskeletal: Normal range of motion.  Skin:    General: Skin is warm and dry.     Findings: No erythema or rash.  Neurological:     General: No focal deficit present.     Mental Status: Alert and oriented to person, place, and time. Mental status is at baseline.     Motor: No weakness.  Psychiatric:        Mood and Affect: Mood normal.        Behavior: Behavior normal.      Assessment/Plan: The patient is scheduled for bilateral breast reduction with Dr. Claudia Desanctis.  Risks, benefits, and alternatives of procedure discussed, questions answered and consent obtained.     Smoking Status: non-tobacco user/occasional THC consumption ; Counseling Given? Yes- suggested she not use any THC & do not be exposed to passive smoke Last Mammogram: n/a; Results: n/a   Caprini Score: 6; Risk Factors include: , BMI = 40.96 , length of planned surgery, family Hx of lower extremity blood clots= maternal grandmother. Recommendation for mechanical SCD, pharmacological prophylaxis. Encourage early ambulation.    Pictures obtained: yes   Post-op Rx sent to pharmacy: Dr. Claudia Desanctis   Patient was provided with the breast reduction and General Surgical Risk consent document and Pain Medication Agreement prior to their appointment.  They had adequate time to read through the risk consent documents and Pain Medication Agreement. We also discussed them in person together during this preop appointment. All of their questions were answered to their satisfaction.  Recommended calling if they have any further questions.  Risk consent form and Pain Medication Agreement to be scanned into patient's chart.   The risk that can be encountered with breast reduction were discussed and include the following but not limited to these:  Breast asymmetry, fluid  accumulation, firmness of the breast, inability to breast feed, loss of nipple or areola, skin loss, decrease or no nipple sensation, fat necrosis of the breast tissue, bleeding, infection, healing delay.  There are risks of anesthesia, changes to skin sensation and injury to nerves or blood vessels.  The muscle can be temporarily or permanently injured.  You may have an allergic reaction to tape, suture, glue, blood products which can result in skin discoloration, swelling, pain, skin lesions, poor healing.  Any of these can lead to the need for revisonal surgery or stage procedures.  A reduction has potential to interfere with diagnostic procedures.  Nipple or breast piercing can increase risks of infection.  This procedure is best done when the breast is fully developed.  Changes in the breast will continue to occur over time.  Pregnancy can alter the outcomes of previous breast reduction surgery, weight gain and weigh loss can also effect the long term appearance.

## 2020-11-07 ENCOUNTER — Encounter (HOSPITAL_BASED_OUTPATIENT_CLINIC_OR_DEPARTMENT_OTHER): Payer: Self-pay | Admitting: Plastic Surgery

## 2020-11-07 ENCOUNTER — Telehealth: Payer: Self-pay

## 2020-11-07 LAB — SURGICAL PATHOLOGY

## 2020-11-07 NOTE — Telephone Encounter (Signed)
Patient called to say that she had surgery with Dr. Claudia Desanctis yesterday and her pain medications are not working.  Patient requests that we please prescribe another medication for her.  Please call.  *Patient's preferred pharmacy is [facility] on [address].

## 2020-11-07 NOTE — Telephone Encounter (Signed)
Returned patients call. BL breast reduction was performed yesterday with Dr. Claudia Desanctis. She is in a lot of pain under BL axillary areas, the right is worse than the left. Advised these areas are very tender after having breast surgery. She has been doubling the dosage amount of Hydrocodone 5/325 to the amount of 10/650 when taken. Strongly cautioned not to double up the [place].  Suggested to alternate IBU '600mg'$  with Tylenol '500mg'$  during the day, take/save the Hydrocodone 5/325 prior to bed. Do not exceed more than 3000 mg of Tylenol in a 24 hours period. Keep compression on at all times. May take a shower after 3 full complete days from surgery. Let the soap run down shoulders. Keep compression on at all times other when taking a shower. Call office back if she has any further questions or anything changes. Patient understood and agreed with plan.

## 2020-11-08 NOTE — Anesthesia Postprocedure Evaluation (Signed)
Anesthesia Post Note  Patient: Emma Chambers  Procedure(s) Performed: MAMMARY REDUCTION  (BREAST) (Bilateral: Breast)     Patient location during evaluation: PACU Anesthesia Type: General Level of consciousness: awake and alert Pain management: pain level controlled Vital Signs Assessment: post-procedure vital signs reviewed and stable Respiratory status: spontaneous breathing, nonlabored ventilation, respiratory function stable and patient connected to nasal cannula oxygen Cardiovascular status: blood pressure returned to baseline and stable Postop Assessment: no apparent nausea or vomiting Anesthetic complications: no   No notable events documented.        Effie Berkshire

## 2020-11-12 ENCOUNTER — Emergency Department (HOSPITAL_COMMUNITY)
Admission: EM | Admit: 2020-11-12 | Discharge: 2020-11-12 | Disposition: A | Discharge status: Home / Self Care | Payer: Medicaid Other | Attending: Emergency Medicine | Admitting: Emergency Medicine

## 2020-11-12 ENCOUNTER — Other Ambulatory Visit: Payer: Self-pay

## 2020-11-12 DIAGNOSIS — Z9886 Personal history of breast implant removal: Secondary | ICD-10-CM | POA: Diagnosis not present

## 2020-11-12 DIAGNOSIS — T8131XA Disruption of external operation (surgical) wound, not elsewhere classified, initial encounter: Secondary | ICD-10-CM | POA: Insufficient documentation

## 2020-11-12 DIAGNOSIS — Z5321 Procedure and treatment not carried out due to patient leaving prior to being seen by health care provider: Secondary | ICD-10-CM | POA: Insufficient documentation

## 2020-11-12 NOTE — ED Triage Notes (Signed)
Patient arrives with for wound check. Patient states that she had bilateral breast reduction 6 days ago. She was taking a walk today and her incision sites under her breast started burning. The incision on her right side started to open. Patient here for sites to be evaluated.

## 2020-11-12 NOTE — ED Provider Notes (Signed)
Emergency Medicine Provider Triage Evaluation Note  Emma Chambers , a 30 y.o. female  was evaluated in triage.  Pt complains of wound dehiscence.  Patient reports she had breast reduction surgery earlier this week.  Was on a walk today when she noticed burning at her incision site right lateral breast.  Patient took a picture of the area and felt that the area was opening up and came for evaluation.  Review of Systems  Positive: Wound pain Negative: Fever, chills or any additional concerns  Physical Exam  BP (!) 114/102 (BP Location: Left Arm)   Pulse 88   Temp 99.4 F (37.4 C) (Oral)   Resp 16   LMP 10/20/2020 Comment: UPT negative DOS  SpO2 96%  Gen:   Awake, no distress   Resp:  Normal effort  MSK:   Moves extremities without difficulty  Other:    Examination chaperoned by RN Lovena Le.  Subcentimeter area where wound edge has opened up on the right lateral incision.  No deep tissue involvement.  No active bleeding or drainage.   Medical Decision Making  Medically screening exam initiated at 12:42 PM.  Appropriate orders placed.  ARIBA DEJARNETTE was informed that the remainder of the evaluation will be completed by another provider, this initial triage assessment does not replace that evaluation, and the importance of remaining in the ED until their evaluation is complete.    Note: Portions of this report may have been transcribed using voice recognition software. Every effort was made to ensure accuracy; however, inadvertent computerized transcription errors may still be present.    Gari Crown 11/12/20 1244    Wyvonnia Dusky, MD 11/12/20 [phone]

## 2020-11-12 NOTE — ED Notes (Signed)
Pt handed this NT labels and said im leaving and left

## 2020-11-15 ENCOUNTER — Encounter: Payer: Self-pay | Admitting: Plastic Surgery

## 2020-11-15 ENCOUNTER — Ambulatory Visit (INDEPENDENT_AMBULATORY_CARE_PROVIDER_SITE_OTHER): Payer: Medicaid Other | Admitting: Plastic Surgery

## 2020-11-15 ENCOUNTER — Other Ambulatory Visit: Payer: Self-pay

## 2020-11-15 DIAGNOSIS — N62 Hypertrophy of breast: Secondary | ICD-10-CM

## 2020-11-15 NOTE — Progress Notes (Signed)
Patient presents postop from bilateral breast reduction.  Overall she feels like things are going well.  On exam nipple areolar complexes are viable and incisions are intact.  No obvious swelling.  Penrose drains were removed from both sides.  Of asked her to continue to wear supportive garments and avoid strenuous activity and I will see her again in a few weeks.

## 2020-11-24 ENCOUNTER — Telehealth: Payer: Self-pay | Admitting: Plastic Surgery

## 2020-11-24 NOTE — Telephone Encounter (Signed)
Patient called to ask about aftercare instructions regarding a reduction on 8/22. Please call to advise [phone]. Thank you.

## 2020-11-27 NOTE — Telephone Encounter (Signed)
Pt asked if she could take her steri strips off because they have started stinking and peeling off. I adv pt that she can take the steri strips off that have started peeling off. Adv pt that she can now shower but allow the water to roll down to the front of her from the back so that the steri strips don't get overly saturated. Pt reported that she doesn't have any pain. I reminded pt of her f/u appt on 9/14. She conveyed understanding.

## 2020-11-29 ENCOUNTER — Ambulatory Visit (INDEPENDENT_AMBULATORY_CARE_PROVIDER_SITE_OTHER): Payer: Medicaid Other | Admitting: Surgical

## 2020-11-29 ENCOUNTER — Other Ambulatory Visit: Payer: Self-pay

## 2020-11-29 DIAGNOSIS — N62 Hypertrophy of breast: Secondary | ICD-10-CM

## 2020-11-29 NOTE — Progress Notes (Signed)
30 year old female here for follow-up after bilateral breast reduction with Dr. Claudia Desanctis on 11/06/2020. She is overall doing well, she has noticed a wound of the left nipple areola and left breast underneath the fold of her breast.  She has been doing Vaseline to this area.  She has bothered by the smell that she is noticing.  She is not having infectious symptoms.   Chaperone present on exam On exam bilateral NAC's are viable, bilateral breast incisions are overall healing well.  She does have a wound at the T-junction of the left breast that is approximately 0.8 x 0.4 cm.  There is no surrounding erythema or cellulitic changes.  There is a little bit of maceration at the edges of the wound.  No fluctuance or purulence noted with palpation.  She does also have a small wound at approximately 9-10 o'clock of the left NAC that is approximately 2 x 2 mm.  There is no surrounding erythema.  Bilateral breasts are soft, no subcutaneous fluid collection noted with palpation.   Recommend continue with supportive garments and avoid strenuous activity for a few more weeks.  Recommend following up in 4 weeks for reevaluation.  Recommend calling with questions or concerns.  I do not see any sign of infection, seroma, hematoma.

## 2021-01-03 ENCOUNTER — Ambulatory Visit: Payer: Medicaid Other | Admitting: Surgical

## 2021-01-09 ENCOUNTER — Telehealth: Payer: Self-pay

## 2021-01-09 NOTE — Telephone Encounter (Signed)
Patient called to say she has plastic screen coming out of left breast and it hurts really bad.  Please call.

## 2021-01-09 NOTE — Telephone Encounter (Signed)
Called and spoke with she patient regarding the message below.  Informed the patient that we spoke with [facility] and he stated that he can see her on Wednesday or Thursday.  Patient stated that she can come in on Wednesday.  Informed the patient that we will give [place] a call back with an appointment time for Wednesday.  Patient verbalized understanding and agreed.//AB/CMA

## 2021-01-10 ENCOUNTER — Ambulatory Visit (INDEPENDENT_AMBULATORY_CARE_PROVIDER_SITE_OTHER): Payer: Medicaid Other | Admitting: Surgical

## 2021-01-10 ENCOUNTER — Other Ambulatory Visit: Payer: Self-pay

## 2021-01-10 DIAGNOSIS — N62 Hypertrophy of breast: Secondary | ICD-10-CM

## 2021-01-10 NOTE — Progress Notes (Signed)
30 year old female here for follow-up after bilateral breast reduction with Dr. Claudia Desanctis on 11/07/2020.  She is overall doing well, but reports that she has a suture that is protruding to the left breast that is bothering her.  She is very happy with her reduction and is having no other issues at this time.  Chaperone present on exam On exam bilateral NAC's are viable, bilateral breast incisions are intact.  She does have a suture protruding through the T-junction on the left side, there is no surrounding erythema or cellulitic changes.  No subcutaneous fluid collection noted with palpation.   PDS suture was removed from the T-junction of the left breast, patient tolerated this well.  Do not appreciate any other areas of concern on exam.  All of her questions were answered to her content. Pictures were obtained of the patient and placed in the chart with the patient's or guardian's permission. Recommend following up as needed, call with questions or concerns.
